# Patient Record
Sex: Female | Born: 1965 | Race: Black or African American | Hispanic: No | Smoking: Never smoker
Health system: Southern US, Community
[De-identification: ages and names within clinical notes are randomized; demographics above are authoritative.]

## PROBLEM LIST (undated history)

## (undated) DIAGNOSIS — I10 Essential (primary) hypertension: Secondary | ICD-10-CM

## (undated) DIAGNOSIS — T7840XA Allergy, unspecified, initial encounter: Secondary | ICD-10-CM

## (undated) DIAGNOSIS — H269 Unspecified cataract: Secondary | ICD-10-CM

## (undated) DIAGNOSIS — K219 Gastro-esophageal reflux disease without esophagitis: Secondary | ICD-10-CM

## (undated) DIAGNOSIS — C169 Malignant neoplasm of stomach, unspecified: Secondary | ICD-10-CM

## (undated) DIAGNOSIS — Z90710 Acquired absence of both cervix and uterus: Secondary | ICD-10-CM

## (undated) DIAGNOSIS — Z5189 Encounter for other specified aftercare: Secondary | ICD-10-CM

## (undated) DIAGNOSIS — Z800 Family history of malignant neoplasm of digestive organs: Secondary | ICD-10-CM

## (undated) DIAGNOSIS — Z803 Family history of malignant neoplasm of breast: Secondary | ICD-10-CM

## (undated) DIAGNOSIS — D219 Benign neoplasm of connective and other soft tissue, unspecified: Secondary | ICD-10-CM

## (undated) DIAGNOSIS — Z86718 Personal history of other venous thrombosis and embolism: Secondary | ICD-10-CM

## (undated) HISTORY — PX: UPPER GASTROINTESTINAL ENDOSCOPY: SHX188

## (undated) HISTORY — DX: Unspecified cataract: H26.9

## (undated) HISTORY — PX: EYE SURGERY: SHX253

## (undated) HISTORY — DX: Gastro-esophageal reflux disease without esophagitis: K21.9

## (undated) HISTORY — DX: Family history of malignant neoplasm of breast: Z80.3

## (undated) HISTORY — DX: Allergy, unspecified, initial encounter: T78.40XA

## (undated) HISTORY — DX: Encounter for other specified aftercare: Z51.89

## (undated) HISTORY — PX: ABDOMINAL HYSTERECTOMY: SHX81

## (undated) HISTORY — DX: Family history of malignant neoplasm of digestive organs: Z80.0

## (undated) HISTORY — DX: Malignant neoplasm of stomach, unspecified: C16.9

## (undated) HISTORY — DX: Benign neoplasm of connective and other soft tissue, unspecified: D21.9

## (undated) HISTORY — PX: COLONOSCOPY: SHX174

---

## 1997-06-23 ENCOUNTER — Ambulatory Visit (HOSPITAL_COMMUNITY): Admission: RE | Admit: 1997-06-23 | Discharge: 1997-06-23 | Payer: Self-pay | Admitting: Internal Medicine

## 1999-02-22 ENCOUNTER — Other Ambulatory Visit: Admission: RE | Admit: 1999-02-22 | Discharge: 1999-02-22 | Payer: Self-pay | Admitting: Obstetrics and Gynecology

## 1999-03-29 ENCOUNTER — Encounter: Admission: RE | Admit: 1999-03-29 | Discharge: 1999-03-29 | Payer: Self-pay | Admitting: Obstetrics and Gynecology

## 1999-03-29 ENCOUNTER — Encounter: Payer: Self-pay | Admitting: Obstetrics and Gynecology

## 1999-11-08 ENCOUNTER — Emergency Department (HOSPITAL_COMMUNITY): Admission: EM | Admit: 1999-11-08 | Discharge: 1999-11-08 | Payer: Self-pay | Admitting: Emergency Medicine

## 1999-11-08 ENCOUNTER — Encounter: Payer: Self-pay | Admitting: Emergency Medicine

## 2000-02-28 ENCOUNTER — Encounter (INDEPENDENT_AMBULATORY_CARE_PROVIDER_SITE_OTHER): Payer: Self-pay

## 2000-02-28 ENCOUNTER — Other Ambulatory Visit: Admission: RE | Admit: 2000-02-28 | Discharge: 2000-02-28 | Payer: Self-pay | Admitting: Obstetrics and Gynecology

## 2000-06-21 ENCOUNTER — Ambulatory Visit (HOSPITAL_COMMUNITY): Admission: RE | Admit: 2000-06-21 | Discharge: 2000-06-21 | Payer: Self-pay | Admitting: Gastroenterology

## 2001-03-12 ENCOUNTER — Other Ambulatory Visit: Admission: RE | Admit: 2001-03-12 | Discharge: 2001-03-12 | Payer: Self-pay | Admitting: Obstetrics and Gynecology

## 2002-11-06 ENCOUNTER — Ambulatory Visit (HOSPITAL_COMMUNITY): Admission: RE | Admit: 2002-11-06 | Discharge: 2002-11-06 | Payer: Self-pay | Admitting: Internal Medicine

## 2003-10-05 ENCOUNTER — Other Ambulatory Visit: Admission: RE | Admit: 2003-10-05 | Discharge: 2003-10-05 | Payer: Self-pay | Admitting: Obstetrics and Gynecology

## 2003-10-19 ENCOUNTER — Ambulatory Visit (HOSPITAL_COMMUNITY): Admission: RE | Admit: 2003-10-19 | Discharge: 2003-10-19 | Payer: Self-pay | Admitting: Obstetrics and Gynecology

## 2004-09-27 ENCOUNTER — Inpatient Hospital Stay (HOSPITAL_COMMUNITY): Admission: EM | Admit: 2004-09-27 | Discharge: 2004-10-07 | Payer: Self-pay | Admitting: Emergency Medicine

## 2004-09-28 ENCOUNTER — Encounter (INDEPENDENT_AMBULATORY_CARE_PROVIDER_SITE_OTHER): Payer: Self-pay | Admitting: Cardiology

## 2004-10-02 ENCOUNTER — Ambulatory Visit: Payer: Self-pay | Admitting: Oncology

## 2004-10-03 ENCOUNTER — Ambulatory Visit: Payer: Self-pay | Admitting: Oncology

## 2004-10-03 ENCOUNTER — Encounter: Payer: Self-pay | Admitting: Internal Medicine

## 2004-10-04 ENCOUNTER — Encounter (INDEPENDENT_AMBULATORY_CARE_PROVIDER_SITE_OTHER): Payer: Self-pay | Admitting: *Deleted

## 2004-10-19 ENCOUNTER — Ambulatory Visit (HOSPITAL_COMMUNITY): Admission: RE | Admit: 2004-10-19 | Discharge: 2004-10-19 | Payer: Self-pay | Admitting: Obstetrics and Gynecology

## 2004-11-24 ENCOUNTER — Ambulatory Visit: Payer: Self-pay | Admitting: Oncology

## 2004-12-25 ENCOUNTER — Ambulatory Visit (HOSPITAL_COMMUNITY): Admission: RE | Admit: 2004-12-25 | Discharge: 2004-12-25 | Payer: Self-pay | Admitting: Oncology

## 2004-12-28 ENCOUNTER — Ambulatory Visit (HOSPITAL_COMMUNITY): Admission: RE | Admit: 2004-12-28 | Discharge: 2004-12-29 | Payer: Self-pay | Admitting: General Surgery

## 2004-12-28 ENCOUNTER — Encounter (INDEPENDENT_AMBULATORY_CARE_PROVIDER_SITE_OTHER): Payer: Self-pay | Admitting: Specialist

## 2004-12-28 ENCOUNTER — Encounter (INDEPENDENT_AMBULATORY_CARE_PROVIDER_SITE_OTHER): Payer: Self-pay | Admitting: General Surgery

## 2005-01-15 ENCOUNTER — Ambulatory Visit: Payer: Self-pay | Admitting: Oncology

## 2005-05-11 ENCOUNTER — Ambulatory Visit: Payer: Self-pay | Admitting: Oncology

## 2005-05-21 ENCOUNTER — Ambulatory Visit (HOSPITAL_COMMUNITY): Admission: RE | Admit: 2005-05-21 | Discharge: 2005-05-21 | Payer: Self-pay | Admitting: Oncology

## 2005-11-08 ENCOUNTER — Ambulatory Visit: Payer: Self-pay | Admitting: Oncology

## 2005-11-14 LAB — CBC WITH DIFFERENTIAL/PLATELET
BASO%: 0.6 % (ref 0.0–2.0)
Basophils Absolute: 0 10*3/uL (ref 0.0–0.1)
EOS%: 2.1 % (ref 0.0–7.0)
Eosinophils Absolute: 0.1 10*3/uL (ref 0.0–0.5)
HCT: 34.7 % — ABNORMAL LOW (ref 34.8–46.6)
HGB: 11.7 g/dL (ref 11.6–15.9)
LYMPH%: 26.2 % (ref 14.0–48.0)
MCH: 27.9 pg (ref 26.0–34.0)
MCHC: 33.6 g/dL (ref 32.0–36.0)
MCV: 83.1 fL (ref 81.0–101.0)
MONO#: 0.4 10*3/uL (ref 0.1–0.9)
MONO%: 9.8 % (ref 0.0–13.0)
NEUT#: 2.6 10*3/uL (ref 1.5–6.5)
NEUT%: 61.3 % (ref 39.6–76.8)
Platelets: 345 10*3/uL (ref 145–400)
RBC: 4.18 10*6/uL (ref 3.70–5.32)
RDW: 14.9 % — ABNORMAL HIGH (ref 11.3–14.5)
WBC: 4.2 10*3/uL (ref 3.9–10.0)
lymph#: 1.1 10*3/uL (ref 0.9–3.3)

## 2005-11-15 LAB — COMPREHENSIVE METABOLIC PANEL
ALT: 18 U/L (ref 0–40)
AST: 21 U/L (ref 0–37)
Albumin: 4.3 g/dL (ref 3.5–5.2)
Alkaline Phosphatase: 72 U/L (ref 39–117)
BUN: 8 mg/dL (ref 6–23)
CO2: 25 mEq/L (ref 19–32)
Calcium: 9.2 mg/dL (ref 8.4–10.5)
Chloride: 107 mEq/L (ref 96–112)
Creatinine, Ser: 0.78 mg/dL (ref 0.40–1.20)
Glucose, Bld: 124 mg/dL — ABNORMAL HIGH (ref 70–99)
Potassium: 4 mEq/L (ref 3.5–5.3)
Sodium: 138 mEq/L (ref 135–145)
Total Bilirubin: 0.4 mg/dL (ref 0.3–1.2)
Total Protein: 6.8 g/dL (ref 6.0–8.3)

## 2005-11-15 LAB — BETA 2 MICROGLOBULIN, SERUM: Beta-2 Microglobulin: 1.21 mg/L (ref 1.01–1.73)

## 2005-11-16 ENCOUNTER — Ambulatory Visit (HOSPITAL_COMMUNITY): Admission: RE | Admit: 2005-11-16 | Discharge: 2005-11-16 | Payer: Self-pay | Admitting: Oncology

## 2005-11-30 ENCOUNTER — Ambulatory Visit (HOSPITAL_COMMUNITY): Admission: RE | Admit: 2005-11-30 | Discharge: 2005-11-30 | Payer: Self-pay | Admitting: Obstetrics and Gynecology

## 2006-05-24 ENCOUNTER — Emergency Department (HOSPITAL_COMMUNITY): Admission: EM | Admit: 2006-05-24 | Discharge: 2006-05-24 | Payer: Self-pay | Admitting: Family Medicine

## 2006-08-01 ENCOUNTER — Ambulatory Visit (HOSPITAL_BASED_OUTPATIENT_CLINIC_OR_DEPARTMENT_OTHER): Admission: RE | Admit: 2006-08-01 | Discharge: 2006-08-01 | Payer: Self-pay | Admitting: General Surgery

## 2006-08-01 ENCOUNTER — Encounter (INDEPENDENT_AMBULATORY_CARE_PROVIDER_SITE_OTHER): Payer: Self-pay | Admitting: *Deleted

## 2006-09-10 ENCOUNTER — Ambulatory Visit (HOSPITAL_COMMUNITY): Admission: RE | Admit: 2006-09-10 | Discharge: 2006-09-10 | Payer: Self-pay | Admitting: Internal Medicine

## 2006-12-03 ENCOUNTER — Ambulatory Visit: Payer: Self-pay | Admitting: Oncology

## 2006-12-04 ENCOUNTER — Ambulatory Visit (HOSPITAL_COMMUNITY): Admission: RE | Admit: 2006-12-04 | Discharge: 2006-12-04 | Payer: Self-pay | Admitting: Oncology

## 2006-12-04 LAB — CBC WITH DIFFERENTIAL/PLATELET
BASO%: 0.6 % (ref 0.0–2.0)
Basophils Absolute: 0 10*3/uL (ref 0.0–0.1)
EOS%: 1 % (ref 0.0–7.0)
Eosinophils Absolute: 0 10*3/uL (ref 0.0–0.5)
HCT: 32.7 % — ABNORMAL LOW (ref 34.8–46.6)
HGB: 11.3 g/dL — ABNORMAL LOW (ref 11.6–15.9)
LYMPH%: 28.6 % (ref 14.0–48.0)
MCH: 28.1 pg (ref 26.0–34.0)
MCHC: 34.4 g/dL (ref 32.0–36.0)
MCV: 81.7 fL (ref 81.0–101.0)
MONO#: 0.3 10*3/uL (ref 0.1–0.9)
MONO%: 8.2 % (ref 0.0–13.0)
NEUT#: 2.1 10*3/uL (ref 1.5–6.5)
NEUT%: 61.6 % (ref 39.6–76.8)
Platelets: 286 10*3/uL (ref 145–400)
RBC: 4 10*6/uL (ref 3.70–5.32)
RDW: 13.6 % (ref 11.3–14.5)
WBC: 3.4 10*3/uL — ABNORMAL LOW (ref 3.9–10.0)
lymph#: 1 10*3/uL (ref 0.9–3.3)

## 2006-12-06 LAB — LACTATE DEHYDROGENASE: LDH: 159 U/L (ref 94–250)

## 2006-12-06 LAB — COMPREHENSIVE METABOLIC PANEL
ALT: 11 U/L (ref 0–35)
AST: 16 U/L (ref 0–37)
Albumin: 3.9 g/dL (ref 3.5–5.2)
Alkaline Phosphatase: 51 U/L (ref 39–117)
BUN: 10 mg/dL (ref 6–23)
CO2: 22 mEq/L (ref 19–32)
Calcium: 8.9 mg/dL (ref 8.4–10.5)
Chloride: 106 mEq/L (ref 96–112)
Creatinine, Ser: 0.79 mg/dL (ref 0.40–1.20)
Glucose, Bld: 93 mg/dL (ref 70–99)
Potassium: 3.8 mEq/L (ref 3.5–5.3)
Sodium: 137 mEq/L (ref 135–145)
Total Bilirubin: 0.7 mg/dL (ref 0.3–1.2)
Total Protein: 6.5 g/dL (ref 6.0–8.3)

## 2006-12-06 LAB — BETA 2 MICROGLOBULIN, SERUM: Beta-2 Microglobulin: 1.09 mg/L (ref 1.01–1.73)

## 2006-12-06 LAB — ANGIOTENSIN CONVERTING ENZYME: Angiotensin 1 Converting Enzyme: 36 U/L (ref 9–67)

## 2007-05-14 ENCOUNTER — Emergency Department (HOSPITAL_COMMUNITY): Admission: EM | Admit: 2007-05-14 | Discharge: 2007-05-14 | Payer: Self-pay | Admitting: Emergency Medicine

## 2008-05-04 ENCOUNTER — Encounter: Admission: RE | Admit: 2008-05-04 | Discharge: 2008-05-04 | Payer: Self-pay | Admitting: Chiropractic Medicine

## 2010-02-03 ENCOUNTER — Ambulatory Visit (HOSPITAL_COMMUNITY): Admission: RE | Admit: 2010-02-03 | Discharge: 2010-02-03 | Payer: Self-pay | Admitting: Internal Medicine

## 2010-04-30 DIAGNOSIS — K635 Polyp of colon: Secondary | ICD-10-CM

## 2010-04-30 HISTORY — DX: Polyp of colon: K63.5

## 2010-06-29 ENCOUNTER — Other Ambulatory Visit (HOSPITAL_COMMUNITY): Payer: Self-pay | Admitting: Obstetrics and Gynecology

## 2010-06-29 DIAGNOSIS — Z1231 Encounter for screening mammogram for malignant neoplasm of breast: Secondary | ICD-10-CM

## 2010-07-03 ENCOUNTER — Emergency Department (HOSPITAL_COMMUNITY)
Admission: EM | Admit: 2010-07-03 | Discharge: 2010-07-03 | Disposition: A | Discharge status: Home / Self Care | Payer: PRIVATE HEALTH INSURANCE | Attending: Emergency Medicine | Admitting: Emergency Medicine

## 2010-07-03 DIAGNOSIS — T481X5A Adverse effect of skeletal muscle relaxants [neuromuscular blocking agents], initial encounter: Secondary | ICD-10-CM | POA: Insufficient documentation

## 2010-07-03 DIAGNOSIS — R059 Cough, unspecified: Secondary | ICD-10-CM | POA: Insufficient documentation

## 2010-07-03 DIAGNOSIS — Z79899 Other long term (current) drug therapy: Secondary | ICD-10-CM | POA: Insufficient documentation

## 2010-07-03 DIAGNOSIS — I10 Essential (primary) hypertension: Secondary | ICD-10-CM | POA: Insufficient documentation

## 2010-07-03 DIAGNOSIS — R05 Cough: Secondary | ICD-10-CM | POA: Insufficient documentation

## 2010-07-03 DIAGNOSIS — H02849 Edema of unspecified eye, unspecified eyelid: Secondary | ICD-10-CM | POA: Insufficient documentation

## 2010-07-03 DIAGNOSIS — Y929 Unspecified place or not applicable: Secondary | ICD-10-CM | POA: Insufficient documentation

## 2010-07-03 DIAGNOSIS — R0602 Shortness of breath: Secondary | ICD-10-CM | POA: Insufficient documentation

## 2010-07-03 DIAGNOSIS — R454 Irritability and anger: Secondary | ICD-10-CM | POA: Insufficient documentation

## 2010-07-04 ENCOUNTER — Ambulatory Visit (HOSPITAL_COMMUNITY)
Admission: RE | Admit: 2010-07-04 | Discharge: 2010-07-04 | Disposition: A | Discharge status: Home / Self Care | Payer: PRIVATE HEALTH INSURANCE | Source: Ambulatory Visit | Attending: Obstetrics and Gynecology | Admitting: Obstetrics and Gynecology

## 2010-07-04 DIAGNOSIS — Z1231 Encounter for screening mammogram for malignant neoplasm of breast: Secondary | ICD-10-CM | POA: Insufficient documentation

## 2010-07-11 ENCOUNTER — Ambulatory Visit (HOSPITAL_BASED_OUTPATIENT_CLINIC_OR_DEPARTMENT_OTHER)
Admission: RE | Admit: 2010-07-11 | Discharge: 2010-07-11 | Disposition: A | Discharge status: Home / Self Care | Payer: PRIVATE HEALTH INSURANCE | Source: Ambulatory Visit | Attending: Obstetrics and Gynecology | Admitting: Obstetrics and Gynecology

## 2010-07-11 ENCOUNTER — Other Ambulatory Visit: Payer: Self-pay | Admitting: Obstetrics and Gynecology

## 2010-07-11 DIAGNOSIS — N949 Unspecified condition associated with female genital organs and menstrual cycle: Secondary | ICD-10-CM | POA: Insufficient documentation

## 2010-07-11 DIAGNOSIS — N925 Other specified irregular menstruation: Secondary | ICD-10-CM | POA: Insufficient documentation

## 2010-07-11 DIAGNOSIS — N938 Other specified abnormal uterine and vaginal bleeding: Secondary | ICD-10-CM | POA: Insufficient documentation

## 2010-07-11 DIAGNOSIS — Z79899 Other long term (current) drug therapy: Secondary | ICD-10-CM | POA: Insufficient documentation

## 2010-07-11 DIAGNOSIS — I10 Essential (primary) hypertension: Secondary | ICD-10-CM | POA: Insufficient documentation

## 2010-07-11 DIAGNOSIS — N840 Polyp of corpus uteri: Secondary | ICD-10-CM | POA: Insufficient documentation

## 2010-07-11 LAB — POCT I-STAT 4, (NA,K, GLUC, HGB,HCT)
Glucose, Bld: 87 mg/dL (ref 70–99)
HCT: 32 % — ABNORMAL LOW (ref 36.0–46.0)
Hemoglobin: 10.9 g/dL — ABNORMAL LOW (ref 12.0–15.0)
Potassium: 4.4 mEq/L (ref 3.5–5.1)
Sodium: 142 mEq/L (ref 135–145)

## 2010-07-13 NOTE — Op Note (Signed)
  NAME:  Sheryl [name], Sheryl [name] NO.:  [id number]  MEDICAL RECORD NO.:  [id number]          PATIENT TYPE:  LOCATION:                                 FACILITY:  PHYSICIAN:  Maxie Better, M.D.DATE OF BIRTH:  October 25, 1965  DATE OF PROCEDURE:  07/11/2010 DATE OF DISCHARGE:                              OPERATIVE REPORT   PREOPERATIVE DIAGNOSES:  Dysfunctional uterine bleeding and endometrial polyps.  POSTOPERATIVE DIAGNOSES:  Dysfunctional uterine bleeding, endometrial polyp.  PROCEDURE:  Diagnostic hysteroscopy, hysteroscopic resection of endometrial polyp, D and C.  ANESTHESIA:  General, paracervical block.  SURGEON:  Maxie Better, MD  ASSISTANT:  None.  PROCEDURE:  Under adequate general anesthesia, the patient was placed in the dorsal lithotomy position.  She was sterilely prepped and draped in usual fashion.  The bladder was catheterized for moderate amount of urine.  Examination under anesthesia revealed a retroverted uterus.  No adnexal masses could be appreciated.  The bivalve speculum was placed in vagina.  200 mL of 1% Nesacaine was injected paracervical at 3 and 9 o'clock position.  The cervix was parous.  The anterior lip of the cervix was grasped with a single-tooth tenaculum.  The cervix was serially dilated to #31 [facility] dilator.  A glycine primed resectoscope was introduced into the uterine cavity without difficulty, some blood was noted.  Both tubal ostia could not be seen as they appeared to be sclerosed.  The endometrium appeared irregular and thickened. The circumferential inspection did not reveal the polyps that had been noted on sonohysterogram; therefore, the resectoscope was removed.  The cavity was curetted for small amount of tissue.  The resectoscope was then reinserted.  At that point, a posterior polyp was noted which was then resected without difficulty.  The reinspection of this cavity showed good sampling of it with the D  and C.  The cavity was felt to be clean and endocervical canal was without any lesions and the resectoscope was then removed, the cavity once again curetted.  All instruments were then removed from the vagina.  Specimen labeled endometrial curetting with polyp and was sent to pathology.  Estimated blood loss was minimal. Fluid deficit was 75 mL.  Complication was none.  The patient tolerated the procedure well and was transferred to recovery in stable condition.     Maxie Better, M.D.    Hope/MEDQ  D:  07/11/2010  T:  07/11/2010  Job:  [id number]  Electronically Signed by Nena Jordan Xylah Early M.D. on 07/13/2010 05:42:49 AM

## 2010-08-18 ENCOUNTER — Encounter (HOSPITAL_COMMUNITY)
Admission: RE | Admit: 2010-08-18 | Discharge: 2010-08-18 | Disposition: A | Discharge status: Home / Self Care | Payer: PRIVATE HEALTH INSURANCE | Source: Ambulatory Visit | Attending: Obstetrics and Gynecology | Admitting: Obstetrics and Gynecology

## 2010-08-18 LAB — BASIC METABOLIC PANEL
BUN: 8 mg/dL (ref 6–23)
CO2: 24 mEq/L (ref 19–32)
Calcium: 9.3 mg/dL (ref 8.4–10.5)
Chloride: 107 mEq/L (ref 96–112)
Creatinine, Ser: 0.86 mg/dL (ref 0.4–1.2)
GFR calc Af Amer: >60 mL/min (ref >60)
GFR calc non Af Amer: >60 mL/min (ref >60)
Glucose, Bld: 86 mg/dL (ref 70–99)
Potassium: 3.7 mEq/L (ref 3.5–5.1)
Sodium: 136 mEq/L (ref 135–145)

## 2010-08-18 LAB — SURGICAL PCR SCREEN
MRSA, PCR: NEGATIVE
Staphylococcus aureus: NEGATIVE

## 2010-08-18 LAB — CBC
HCT: 36.7 % (ref 36.0–46.0)
Hemoglobin: 12.3 g/dL (ref 12.0–15.0)
MCH: 27.8 pg (ref 26.0–34.0)
MCHC: 33.5 g/dL (ref 30.0–36.0)
MCV: 83 fL (ref 78.0–100.0)
Platelets: 373 10*3/uL (ref 150–400)
RBC: 4.42 MIL/uL (ref 3.87–5.11)
RDW: 13.5 % (ref 11.5–15.5)
WBC: 4.6 10*3/uL (ref 4.0–10.5)

## 2010-08-25 ENCOUNTER — Ambulatory Visit (HOSPITAL_COMMUNITY)
Admission: RE | Admit: 2010-08-25 | Discharge: 2010-08-27 | Disposition: A | Discharge status: Home / Self Care | Payer: PRIVATE HEALTH INSURANCE | Source: Ambulatory Visit | Attending: Obstetrics and Gynecology | Admitting: Obstetrics and Gynecology

## 2010-08-25 ENCOUNTER — Other Ambulatory Visit: Payer: Self-pay | Admitting: Obstetrics and Gynecology

## 2010-08-25 DIAGNOSIS — N938 Other specified abnormal uterine and vaginal bleeding: Secondary | ICD-10-CM | POA: Insufficient documentation

## 2010-08-25 DIAGNOSIS — N949 Unspecified condition associated with female genital organs and menstrual cycle: Secondary | ICD-10-CM | POA: Insufficient documentation

## 2010-08-25 DIAGNOSIS — N7013 Chronic salpingitis and oophoritis: Secondary | ICD-10-CM | POA: Insufficient documentation

## 2010-08-25 DIAGNOSIS — G5610 Other lesions of median nerve, unspecified upper limb: Secondary | ICD-10-CM | POA: Insufficient documentation

## 2010-08-25 DIAGNOSIS — N925 Other specified irregular menstruation: Secondary | ICD-10-CM | POA: Insufficient documentation

## 2010-08-25 DIAGNOSIS — D259 Leiomyoma of uterus, unspecified: Secondary | ICD-10-CM | POA: Insufficient documentation

## 2010-08-25 LAB — BASIC METABOLIC PANEL
BUN: 8 mg/dL (ref 6–23)
CO2: 23 mEq/L (ref 19–32)
Calcium: 8.4 mg/dL (ref 8.4–10.5)
Chloride: 104 mEq/L (ref 96–112)
Creatinine, Ser: 0.93 mg/dL (ref 0.4–1.2)
GFR calc Af Amer: >60 mL/min (ref >60)
GFR calc non Af Amer: >60 mL/min (ref >60)
Glucose, Bld: 169 mg/dL — ABNORMAL HIGH (ref 70–99)
Potassium: 3.8 mEq/L (ref 3.5–5.1)
Sodium: 134 mEq/L — ABNORMAL LOW (ref 135–145)

## 2010-08-25 LAB — CBC
HCT: 33.8 % — ABNORMAL LOW (ref 36.0–46.0)
Hemoglobin: 11.2 g/dL — ABNORMAL LOW (ref 12.0–15.0)
MCH: 27.5 pg (ref 26.0–34.0)
MCHC: 33.1 g/dL (ref 30.0–36.0)
MCV: 82.8 fL (ref 78.0–100.0)
Platelets: 295 10*3/uL (ref 150–400)
RBC: 4.08 MIL/uL (ref 3.87–5.11)
RDW: 13.5 % (ref 11.5–15.5)
WBC: 10 10*3/uL (ref 4.0–10.5)

## 2010-08-26 LAB — CBC
HCT: 32.1 % — ABNORMAL LOW (ref 36.0–46.0)
Hemoglobin: 10.5 g/dL — ABNORMAL LOW (ref 12.0–15.0)
MCH: 27.1 pg (ref 26.0–34.0)
MCHC: 32.7 g/dL (ref 30.0–36.0)
MCV: 82.7 fL (ref 78.0–100.0)
Platelets: 276 10*3/uL (ref 150–400)
RBC: 3.88 MIL/uL (ref 3.87–5.11)
RDW: 13.6 % (ref 11.5–15.5)
WBC: 8 10*3/uL (ref 4.0–10.5)

## 2010-08-26 LAB — BASIC METABOLIC PANEL
BUN: 6 mg/dL (ref 6–23)
CO2: 25 mEq/L (ref 19–32)
Calcium: 8.5 mg/dL (ref 8.4–10.5)
Chloride: 107 mEq/L (ref 96–112)
Creatinine, Ser: 0.7 mg/dL (ref 0.4–1.2)
GFR calc Af Amer: >60 mL/min (ref >60)
GFR calc non Af Amer: >60 mL/min (ref >60)
Glucose, Bld: 134 mg/dL — ABNORMAL HIGH (ref 70–99)
Potassium: 3.8 mEq/L (ref 3.5–5.1)
Sodium: 137 mEq/L (ref 135–145)

## 2010-09-01 ENCOUNTER — Inpatient Hospital Stay (HOSPITAL_COMMUNITY)
Admission: AD | Admit: 2010-09-01 | Discharge: 2010-09-01 | Disposition: A | Discharge status: Home / Self Care | Payer: PRIVATE HEALTH INSURANCE | Source: Ambulatory Visit | Attending: Obstetrics and Gynecology | Admitting: Obstetrics and Gynecology

## 2010-09-01 DIAGNOSIS — M436 Torticollis: Secondary | ICD-10-CM | POA: Insufficient documentation

## 2010-09-01 DIAGNOSIS — M7989 Other specified soft tissue disorders: Secondary | ICD-10-CM | POA: Insufficient documentation

## 2010-09-01 DIAGNOSIS — M542 Cervicalgia: Secondary | ICD-10-CM | POA: Insufficient documentation

## 2010-09-01 LAB — URINALYSIS, ROUTINE W REFLEX MICROSCOPIC
Bilirubin Urine: NEGATIVE
Glucose, UA: NEGATIVE mg/dL
Hgb urine dipstick: NEGATIVE
Ketones, ur: NEGATIVE mg/dL
Nitrite: NEGATIVE
Protein, ur: NEGATIVE mg/dL
Specific Gravity, Urine: 1.01 (ref 1.005–1.030)
Urobilinogen, UA: 0.2 mg/dL (ref 0.0–1.0)
pH: 7 (ref 5.0–8.0)

## 2010-09-02 ENCOUNTER — Inpatient Hospital Stay (HOSPITAL_COMMUNITY)
Admission: AD | Admit: 2010-09-02 | Discharge: 2010-09-02 | Disposition: A | Discharge status: Home / Self Care | Payer: PRIVATE HEALTH INSURANCE | Source: Ambulatory Visit | Attending: Obstetrics and Gynecology | Admitting: Obstetrics and Gynecology

## 2010-09-02 ENCOUNTER — Ambulatory Visit (HOSPITAL_COMMUNITY)
Admission: EM | Admit: 2010-09-02 | Discharge: 2010-09-02 | Disposition: A | Discharge status: Home / Self Care | Payer: PRIVATE HEALTH INSURANCE | Source: Ambulatory Visit | Attending: Obstetrics and Gynecology | Admitting: Obstetrics and Gynecology

## 2010-09-02 DIAGNOSIS — M79609 Pain in unspecified limb: Secondary | ICD-10-CM

## 2010-09-02 DIAGNOSIS — M542 Cervicalgia: Secondary | ICD-10-CM | POA: Insufficient documentation

## 2010-09-02 DIAGNOSIS — M7989 Other specified soft tissue disorders: Secondary | ICD-10-CM | POA: Insufficient documentation

## 2010-09-02 DIAGNOSIS — M436 Torticollis: Secondary | ICD-10-CM | POA: Insufficient documentation

## 2010-09-02 DIAGNOSIS — I82619 Acute embolism and thrombosis of superficial veins of unspecified upper extremity: Secondary | ICD-10-CM | POA: Insufficient documentation

## 2010-09-03 LAB — URINE CULTURE
Colony Count: NO GROWTH
Culture  Setup Time: 201205050423
Culture: NO GROWTH

## 2010-09-08 ENCOUNTER — Other Ambulatory Visit: Payer: Self-pay | Admitting: Hematology and Oncology

## 2010-09-08 ENCOUNTER — Encounter (HOSPITAL_BASED_OUTPATIENT_CLINIC_OR_DEPARTMENT_OTHER): Payer: PRIVATE HEALTH INSURANCE | Admitting: Hematology and Oncology

## 2010-09-08 DIAGNOSIS — R1902 Left upper quadrant abdominal swelling, mass and lump: Secondary | ICD-10-CM

## 2010-09-08 DIAGNOSIS — R1900 Intra-abdominal and pelvic swelling, mass and lump, unspecified site: Secondary | ICD-10-CM

## 2010-09-08 DIAGNOSIS — I82509 Chronic embolism and thrombosis of unspecified deep veins of unspecified lower extremity: Secondary | ICD-10-CM

## 2010-09-08 LAB — COMPREHENSIVE METABOLIC PANEL
ALT: 34 U/L (ref 0–35)
AST: 27 U/L (ref 0–37)
Albumin: 3.9 g/dL (ref 3.5–5.2)
Alkaline Phosphatase: 101 U/L (ref 39–117)
BUN: 10 mg/dL (ref 6–23)
CO2: 25 mEq/L (ref 19–32)
Calcium: 9.2 mg/dL (ref 8.4–10.5)
Chloride: 102 mEq/L (ref 96–112)
Creatinine, Ser: 0.66 mg/dL (ref 0.40–1.20)
Glucose, Bld: 86 mg/dL (ref 70–99)
Potassium: 4 mEq/L (ref 3.5–5.3)
Sodium: 135 mEq/L (ref 135–145)
Total Bilirubin: 0.2 mg/dL — ABNORMAL LOW (ref 0.3–1.2)
Total Protein: 7.3 g/dL (ref 6.0–8.3)

## 2010-09-08 LAB — CBC WITH DIFFERENTIAL/PLATELET
BASO%: 0.4 % (ref 0.0–2.0)
Basophils Absolute: 0 10*3/uL (ref 0.0–0.1)
EOS%: 1.2 % (ref 0.0–7.0)
Eosinophils Absolute: 0.1 10*3/uL (ref 0.0–0.5)
HCT: 33.8 % — ABNORMAL LOW (ref 34.8–46.6)
HGB: 11.3 g/dL — ABNORMAL LOW (ref 11.6–15.9)
LYMPH%: 19.2 % (ref 14.0–49.7)
MCH: 27.8 pg (ref 25.1–34.0)
MCHC: 33.3 g/dL (ref 31.5–36.0)
MCV: 83.4 fL (ref 79.5–101.0)
MONO#: 0.4 10*3/uL (ref 0.1–0.9)
MONO%: 7.3 % (ref 0.0–14.0)
NEUT#: 3.9 10*3/uL (ref 1.5–6.5)
NEUT%: 71.9 % (ref 38.4–76.8)
Platelets: 430 10*3/uL — ABNORMAL HIGH (ref 145–400)
RBC: 4.06 10*6/uL (ref 3.70–5.45)
RDW: 14.4 % (ref 11.2–14.5)
WBC: 5.4 10*3/uL (ref 3.9–10.3)
lymph#: 1 10*3/uL (ref 0.9–3.3)

## 2010-09-08 LAB — IRON AND TIBC
%SAT: 11 % — ABNORMAL LOW (ref 20–55)
Iron: 33 ug/dL — ABNORMAL LOW (ref 42–145)
TIBC: 287 ug/dL (ref 250–470)
UIBC: 254 ug/dL

## 2010-09-08 LAB — PROTIME-INR
INR: 1.1 — ABNORMAL LOW (ref 2.00–3.50)
Protime: 13.2 Seconds (ref 10.6–13.4)

## 2010-09-08 LAB — LACTATE DEHYDROGENASE: LDH: 158 U/L (ref 94–250)

## 2010-09-08 LAB — FERRITIN: Ferritin: 43 ng/mL (ref 10–291)

## 2010-09-11 NOTE — Discharge Summary (Signed)
NAME:  Sheryl [name], COPE NO.:  [id number]  MEDICAL RECORD NO.:  [id number]           PATIENT TYPE:  O  LOCATION:  9316                          FACILITY:  [facility]  PHYSICIAN:  Maxie Better, M.D.DATE OF BIRTH:  [birth year]  DATE OF ADMISSION:  08/25/2010 DATE OF DISCHARGE:  08/27/2010                              DISCHARGE SUMMARY   ADMISSION DIAGNOSES:  Dysfunctional uterine bleeding, fibroid uterus, possible right hydrosalpinx.  DISCHARGE DIAGNOSES:  Dysfunctional uterine bleeding, fibroid uterus, mesenteric injury.  PROCEDURES:  Da Vinci robotic total laparoscopic hysterectomy and intraoperative diagnostic laparoscopy.  HISTORY OF PRESENT ILLNESS:  This is a 45 year old female admitted for a da Vinci  robotic TLH secondary to dysfunctional uterine bleeding and fibroid uterus.  There was a possibility of a right hydrosalpinx noted on ultrasound.  The patient underwent a diagnostic hysteroscopy and removal of endometrial polyps but continued to have dysfunctional bleeding as well as pelvic pain with her cycle and felt that she wanted to do definitive surgical management.  Risks of procedure was reviewed with the patient.  Consent was signed and the patient went to the operating room.  HOSPITAL COURSE:  The patient was admitted to [facility] and she was taken to the operating room where she underwent da Vinci robotic total laparoscopic hysterectomy.  That procedure was performed after evaluation for what appeared to have been a trocar cannula injury in preparation for the da Vinci robotic surgery.  The patient had mesenteric injury for which General Surgery was called intraoperatively. Dr. Lindie Spruce was kind enough to respond and assess the injury and the procedure was then continued thereafter.  Please see the dictated operative note from his notes about his portion of the case. Postoperatively, the patient had a slow bowel function.  She had some bowel  sounds but was not passing gas.  She was remained in the hospital with aggressive bowel regimen management, ultimately had spontaneous passage of flatus at the wee hours of the night.  Intraoperative findings were that of a fibroid uterus measuring 302 grams.  Surgical management of tubal ligation in the past without any evidence of hydrosalpinx and therefore the tubes are not removed.  Ovaries were normal although there were adhesions surrounding them.  Normal upper abdomen.  The final pathology is pending on the surgery.  The blood loss was related to the mesenteric injury.  CBC was obtained in the PACU which showed hemoglobin of 11.2, hematocrit of 33.8, white count of 10, platelet count 295,000.  Her postop day #1 CBC showed a hemoglobin 10.5, hematocrit 32.1, white count of 8, platelet count of 276,000.  She had elevation of glucose on BMET, but otherwise those labs were all stable. On the day postop of surgery, the patient complained of numbness of the first three fingers extending up to her antecubital area with some pain surrounding that area.  Evaluation revealed that that arm on the left was little bit swollen relative to the right side and the distribution of her complaint related to median nerve compression.  The patient was evaluated by anesthesia who had recommended occupational therapy and physical therapy which was  not available on the weekend.  Nonetheless, by postop day #2, her symptoms essentially resolved.  Her abdomen had great bowel sounds.  Incision was well approximated.  The rest of exam was unremarkable.  The patient was deemed well to be discharged home. Disposition is home.  Condition stable.  DISCHARGE MEDICATIONS:  The patient already had Tylox at home which should be 1-2 tablets every 3-4 hours p.r.n. pain and please see the rest of her medications on the medical reconciliation sheet.  DISCHARGE INSTRUCTIONS:  To call for temperature greater than or  equal to 100.4.  Nothing per vagina for 4-6 weeks.  No driving for 2 weeks. No lifting greater 25 pounds for 2 weeks.  Call for severe abdominal pain, nausea, vomiting, no straining with bowel movements, increased incisional pain, drainage or redness of incision site.  Followup in 2 weeks at [facility] for vaginal cuff evaluation and incision, otherwise 6 weeks postop.     Maxie Better, M.D.     Polo/MEDQ  D:  08/27/2010  T:  08/27/2010  Job:  [id number]  Electronically Signed by Nena Jordan Taylie Helder M.D. on 09/11/2010 07:25:34 AM

## 2010-09-13 ENCOUNTER — Ambulatory Visit (HOSPITAL_COMMUNITY)
Admission: AD | Admit: 2010-09-13 | Discharge: 2010-09-13 | Disposition: A | Discharge status: Home / Self Care | Payer: PRIVATE HEALTH INSURANCE | Source: Ambulatory Visit | Attending: Obstetrics and Gynecology | Admitting: Obstetrics and Gynecology

## 2010-09-13 DIAGNOSIS — T81329A Deep disruption or dehiscence of operation wound, unspecified, initial encounter: Secondary | ICD-10-CM | POA: Insufficient documentation

## 2010-09-13 DIAGNOSIS — Y838 Other surgical procedures as the cause of abnormal reaction of the patient, or of later complication, without mention of misadventure at the time of the procedure: Secondary | ICD-10-CM | POA: Insufficient documentation

## 2010-09-13 DIAGNOSIS — IMO0002 Reserved for concepts with insufficient information to code with codable children: Secondary | ICD-10-CM | POA: Insufficient documentation

## 2010-09-13 DIAGNOSIS — T8132XA Disruption of internal operation (surgical) wound, not elsewhere classified, initial encounter: Secondary | ICD-10-CM | POA: Insufficient documentation

## 2010-09-13 LAB — CBC
HCT: 33.7 % — ABNORMAL LOW (ref 36.0–46.0)
HCT: 29.9 % — ABNORMAL LOW (ref 36.0–46.0)
Hemoglobin: 11.2 g/dL — ABNORMAL LOW (ref 12.0–15.0)
Hemoglobin: 9.7 g/dL — ABNORMAL LOW (ref 12.0–15.0)
MCH: 26.9 pg (ref 26.0–34.0)
MCH: 26.3 pg (ref 26.0–34.0)
MCHC: 33.2 g/dL (ref 30.0–36.0)
MCHC: 32.4 g/dL (ref 30.0–36.0)
MCV: 80.8 fL (ref 78.0–100.0)
MCV: 81 fL (ref 78.0–100.0)
Platelets: 515 10*3/uL — ABNORMAL HIGH (ref 150–400)
Platelets: 459 10*3/uL — ABNORMAL HIGH (ref 150–400)
RBC: 4.17 MIL/uL (ref 3.87–5.11)
RBC: 3.69 MIL/uL — ABNORMAL LOW (ref 3.87–5.11)
RDW: 13.8 % (ref 11.5–15.5)
RDW: 13.7 % (ref 11.5–15.5)
WBC: 6.3 10*3/uL (ref 4.0–10.5)
WBC: 6.4 10*3/uL (ref 4.0–10.5)

## 2010-09-13 LAB — SAMPLE TO BLOOD BANK

## 2010-09-14 ENCOUNTER — Ambulatory Visit (HOSPITAL_COMMUNITY)
Admission: RE | Admit: 2010-09-14 | Discharge: 2010-09-14 | Disposition: A | Discharge status: Home / Self Care | Payer: PRIVATE HEALTH INSURANCE | Source: Ambulatory Visit | Attending: Hematology and Oncology | Admitting: Hematology and Oncology

## 2010-09-14 DIAGNOSIS — Z87898 Personal history of other specified conditions: Secondary | ICD-10-CM | POA: Insufficient documentation

## 2010-09-14 DIAGNOSIS — R1902 Left upper quadrant abdominal swelling, mass and lump: Secondary | ICD-10-CM | POA: Insufficient documentation

## 2010-09-14 DIAGNOSIS — Z90710 Acquired absence of both cervix and uterus: Secondary | ICD-10-CM | POA: Insufficient documentation

## 2010-09-14 DIAGNOSIS — N289 Disorder of kidney and ureter, unspecified: Secondary | ICD-10-CM | POA: Insufficient documentation

## 2010-09-14 DIAGNOSIS — R1900 Intra-abdominal and pelvic swelling, mass and lump, unspecified site: Secondary | ICD-10-CM

## 2010-09-14 DIAGNOSIS — R609 Edema, unspecified: Secondary | ICD-10-CM | POA: Insufficient documentation

## 2010-09-14 DIAGNOSIS — N898 Other specified noninflammatory disorders of vagina: Secondary | ICD-10-CM | POA: Insufficient documentation

## 2010-09-14 MED ORDER — IOHEXOL 300 MG/ML  SOLN
125.0000 mL | Freq: Once | INTRAMUSCULAR | Status: AC | PRN
  Administered 2010-09-14: 125 mL via INTRAVENOUS

## 2010-09-15 NOTE — Discharge Summary (Signed)
NAME:  Sheryl [name], Sheryl [name] NO.:  [id number]   MEDICAL RECORD NO.:  [id number]          PATIENT TYPE:  INP   LOCATION:  3728                         FACILITY:  [facility]   PHYSICIAN:  Margaretmary Bayley, M.D.    DATE OF BIRTH:  [birth year]   DATE OF ADMISSION:  09/27/2004  DATE OF DISCHARGE:  10/07/2004                                 DISCHARGE SUMMARY   DISCHARGE DIAGNOSES:  1.  Left upper quadrant abdominal mass with enlarged regional lymph nodes.      Rule out lymphoma.  2.  Complex cysts, kidney.  3.  Gastroesophageal reflux disease.   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  She  denies any associated shortness of breath, no diaphoresis, nausea or  vomiting.  The patient was seen in the emergency room where she was  evaluated, and as part of her evaluation, she had a slightly increased CK  level, but otherwise, negative workup, but is admitted because of the  failure of any regression of her pain with remedies in the emergency room.  She denies any previous history of cigarette smoking, previous heart disease  or lung disease.  No fever, chills or night sweats.   PHYSICAL EXAMINATION:  GENERAL APPEARANCE:  She is a well-developed,  slightly obese, African American female. Anxious, but without any acute  distress.  VITAL SIGNS:  Blood pressure 142/84, pulse 88, respirations 22, temperature  98.8.  HEENT:  Nonicteric.  SKIN:  Warm and dry.  NECK:  Supple.  No lymphadenopathy.  She has mild generalized enlargement of  her thyroid.  No tenderness.  Axilla was fatty adenopathy.  BREASTS:  __________ tenderness.  CHEST:  There is no focal chest wall tenderness.  Lungs were clear.  CARDIAC:  Regular rate and rhythm.  No murmurs,  rubs, gallops, heaves or  thrills.  ABDOMEN:  Obese, soft, nontender.  No organomegaly or masses.   PERTINENT LABORATORY DATA:  Admission CK is elevated at 260, but her MB band  is normally 2.0.  Her relative index is 0.8.  Anterior troponin is 0.01.  EKG revealed normal sinus rhythm.  She had no acute or chronic changes, and  it was felt to be a normal tracing.  Chest x-ray showed no acute infiltrate,  but she did have some increased peribronchial thickening.  Her white count  is normal at 5200, hemoglobin 12.8, hematocrit 37 and the rest of her  comprehensive metabolic studies were normal.   HOSPITAL COURSE:  The patient is admitted with chest pain without any  musculoskeletal signs.  Etiology of which was in doubt on admission.  SAO2  was 100 on room air as noted above.  She had a nicely elevated increase in  CK, but she had no abnormalities on chest x-ray, and the pain was atypical  of coronary artery disease and angina.  An attempt to  exclude the  possibility of a GI source of her pain, and because the patient did develop  some tenderness in the epigastrium, a GI workup was initiated starting with  an abdominal ultrasound.  There was no evidence of any gallstones or  thickening of the gallbladder wall.  There was no evidence of any biliary  duct dilatation.  Her liver demonstrated some mild increased echogenicity  felt to be consistent with fatty infiltration.  However, there was no focal  liver masses noted.  She had a normal sized right kidney.  The left kidney  was normal in size without any hydronephrosis.  However, there was a complex  cyst in the mid pole of the right kidney.  In addition, there was a  hypoechoic mass located within the spleen and left kidney area, which  measured about 4 cm.  It appeared to be distinct and separate from the  kidney and the spleen.  It was felt that this possibly may have represented  an accessory spindle.  A CT scan was done to further  identify this  abnormality.  A CT scan done with and without contrast was performed and did  indicate 47x44x46 mm left upper lobe mass contiguous of the spleen with  surrounding adenopathy, and raised concern for lymphoma.  The CT of the  pelvis was completely unremarkable except for some mild enlargement of her  uterus.  A subsequent MRI of her abdomen revealed an entomogenous mass  immediately adjacent to the inferior aspect of the spleen and the tip of the  tail of the pancreas.  The precise site of origin could not be determined.  It was noted that there was fairly extensive adenopathy surrounding the  lesion and extending both anterior and posterior to the pancreas and into  the gastrohepatic space.  This too was felt to be consistent with a lymphoma  or a pancreatic neoplasm, although, there was no evidence of liver mets.  The patient was seen by the oncology service and subsequently underwent a  left upper quadrant biopsy which was unfortunately nondiagnostic.  The flow  cytometric analysis demonstrated predominantly B-cells with the minor  component of T-cells.  No definitive evidence of lymphoma was found.  The  tissue fragments, however, did show some background of atypical lymphoid  infiltrates, and these abnormalities were felt to be significant enough to  ask the patient to proceed with a more definitive biopsy, even up to the  point of open biopsy.  This, however, was not felt to be a procedure that  needed to be done on this admission, and she was subsequently discharged to  be followed up by the Oncology Service, Dr. Darnelle Catalan, who would schedule the  patient for a general surgical evaluation for subsequent biopsy.  The  patient's chest pain did resolve during her hospital stay, although, there  were many doubts as to whether or not this particular pain was being cause  by the left upper quadrant abnormality noted on her ultrasound and subsequently confirmed on CT and  MRI.   CONDITION ON DISCHARGE:  Improved.   PROGNOSIS:  Cannot be determined based on the fact that a diagnosis was not  made regarding the left upper quadrant mass.   PLAN:  She is to be scheduled for a PET scan along with open biopsy as an  outpatient.  She is scheduled to see Dr. Darnelle Catalan in two weeks.  She is to  start on Vicodin one p.o. q.4h. p.r.n.  PC/MEDQ  D:  12/07/2004  T:  12/07/2004  Job:  [id number]

## 2010-09-15 NOTE — Procedures (Signed)
[facility]  Patient:    Sheryl [name], Sheryl [name]                     MRN: [id number] Proc. Date: 06/21/00 Adm. Date:  [id number] Disc. Date: [id number] Attending:  Orland Mustard CC:         Lindell Spar. Chestine Spore, M.D.   Procedure Report  PROCEDURE:  Colonoscopy.  MEDICATIONS:  Fentanyl 100 mcg, Versed 10 mg IV.  SCOPE:  Olympus video colonoscope.  INDICATIONS FOR PROCEDURE:  Strong family history of colon polyps with recent rectal bleeding.  DESCRIPTION OF PROCEDURE:  The procedure had been explained to the patient and consent obtained. With the patient in the left lateral decubitus position, the Olympus scope was inserted and advanced under direct visualization. The prep was excellent. We were able to advance to the cecum using abdominal pressure and position changes. The ileocecal valve and appendiceal orifice were seen. The scope withdrawn. The cecum, ascending colon, hepatic flexure, transverse colon, splenic flexure, descending and sigmoid colon were seen well upon removal. No polyps seen throughout and no diverticular disease. Internal hemorrhoids seen in the rectum. The scope withdrawn, the patient tolerated the procedure well.  ASSESSMENT:  1. Internal hemorrhoids.  2. No evidence of colon polyps.  PLAN:  Will recommend repeating procedure in five years. Will give hemorrhoid instruction and will see on an as needed basis. DD:  06/21/00 TD:  06/24/00 Job: [id number]

## 2010-09-15 NOTE — Consult Note (Signed)
NAME:  Sheryl [name], Sheryl [name] NO.:  [id number]   MEDICAL RECORD NO.:  [id number]          PATIENT TYPE:  INP   LOCATION:  3728                         FACILITY:  [facility]   PHYSICIAN:  Valentino Hue. Magrinat, M.D.DATE OF BIRTH:  Jul 21, 1965   DATE OF CONSULTATION:  10/02/2004  DATE OF DISCHARGE:                                   CONSULTATION   REASON FOR CONSULTATION:  Left upper abdominal mass.   HISTORY OF PRESENT ILLNESS:  Sheryl [name] is a delightful 45 year old  [place] woman, admitted on September 28, 2004 with left anterior chest pain and  diaphoresis, with negative cardiac workup.  Chest x-ray was negative for  acute infiltrates or cardiomegaly or edema.  However, since she complained  of upper abdominal pain with radiation to her left flank, an ultrasound of  the abdomen was performed, showing fatty infiltration of the liver, with a 3  cm complex cyst of the left kidney and a 4 cm solid mass between the spleen  and the left kidney.  This was further investigated with a CT of the abdomen  on September 29, 2004 with contrast, revealing a 22 x 32 mm left renal cyst,  unremarkable liver, no gallstones, normal pancreas but, a 47 x 44 x 46 mm  enhancing irregular lesion lateral to the left kidney above the pancreatic  tail, but below the spleen, with adjacent lymphadenopathy.  This was  worrisome for possible neoplasm versus lymphoma.  The small and large bowel  were normal.  No retroperitoneal lymphadenopathy was seen.  PET scan is  scheduled for the morning.   PAST MEDICAL HISTORY:  Otherwise unremarkable.   SURGERIES:  1.  Status post tubal ligation.  2.  Status post left cataract surgery.   ALLERGIES:  NO KNOWN DRUG ALLERGIES.   CURRENT MEDICATIONS:  1.  Xanax 0.25 mg b.i.d.  2.  Avapro 150 mg daily.  3.  Tylenol p.r.n.  4.  Morphine sulfate 2-5 mg IV q.2 hours p.r.n.  5.  Senokot p.r.n.  Of note, the patient was on no medications on admission.   REVIEW OF SYSTEMS:   Remarkable for intermittent low-grade fever, reaching 99-  99.6 degrees.  No chills.  No night sweats, fatigue or weight loss.  No  anorexia, vision changes since her last cataract surgery.  No headaches or  shortness of breath.  She has mild dyspnea on exertion believed to be due to  lack of activity.  No cough or sputum production.  Her chest pain is  negative at this time.  She did have some episodes of what is believed to be  GERD symptoms, which were relieved after the use of antacids.  No  palpitations.  She has left abdominal pain, worse with movement, with no  changes since admission.  This pain radiates to the left flank area.  No  nausea, vomiting or diarrhea.  No constipation.  No dysphagia, hematemesis,  or blood in the stools.  No dysuria or gross hematuria.  No myalgias.  She  does have some numbness and tingling in the left arm, and left fingers, for  the last two years.   FAMILY HISTORY:  Mother alive with a history of diabetes, hypertension and  CAD.  Father alive in good health.  She has two sisters in good health and  two brothers healthy as well.  Her grandmother and one aunt died with  metastatic breast cancer to the brain.  One uncle died with liver cancer and  one uncle died with stomach cancer.   SOCIAL HISTORY:  The patient is married to her husband Billey Gosling, telephone  number [phone], and has two grown children in good health ages 87 and 2.  She is a Metallurgist.  No alcohol or tobacco history.  She lives  in [place].  She has two years of college.  Her religion is Personal [employer].  On  health maintenance, her last flu vaccine was in 2005, no pneumonia vaccine  received.  Last mammogram on October 19, 2003, negative.  Her last menstrual  period was on Sep 20, 2004, normal.   PHYSICAL EXAMINATION:  This is a well-developed, moderately obese 45-year-  old black female in no acute distress, alert and oriented x3.  Blood  pressure 126/62, pulse 73,  respirations 20, temperature 98.4, pulse oximetry  96% in room air, weight 202.9, height 5 foot 8 inches.  HEENT:  Normocephalic and atraumatic.  PERRLA.  Oral mucosa without thrush  or lesions.  NECK:  Supple, no cervical or supraclavicular masses.  LUNGS:  Essentially clear to auscultation bilaterally with no axillary  masses.  BREASTS:  Remarkable for tenderness to palpation on the left inner quadrant  area towards the areola, with no discrete mass.  CARDIOVASCULAR:  Regular rate and rhythm, without murmurs, rubs or gallops.  ABDOMEN:  Moderately obese, soft, tender in the left upper quadrant, bowel  sounds x4.  No palpable spleen or liver.  GYN/RECTAL:  Deferred.  EXTREMITIES:  With no clubbing or cyanosis, no edema.  SKIN:  Without lesions, bruising or petechiae.  NEUROLOGIC:  Nonfocal.   LABS:  Hemoglobin 12.4, hematocrit 36.9, white count 4.6, platelets 364,  neutrophil 82.7, MCV 3.4, ASR 6, sodium 138, potassium 3.9, BUN 10,  creatinine 0.9, glucose 109, total bilirubin 0.7, alkaline phosphatase 72,  AST 25, ALT 21, total protein 6.3, albumin 3.6, calcium 9.2, D-Dimer on Sep 27, 2004 was less than 0.22, troponin I on Sep 27, 2004 was 0.01.   ASSESSMENT/PLAN:  Sheryl [name] is a delightful 45 year old [place] woman,  found to have a left 4 cm irregular solid mass between the left kidney and  spleen, with adjacent lymphadenopathy, raising the question of a neoplasm  versus lymphoma.  PET scan is scheduled for tomorrow.  Agree with biopsy of  the mass for definite diagnosis.  Dr. Briant Cedar is to see the patient later  this evening, to meet the patient, and to answer any questions that the  patient may have.  We will arrange an outpatient followup with all the  database obtained, on October 24, 2004 at 4:45 p.m.  If the time and date are  inconvenient to the patient, earlier appointment with either MD from our group can be arranged.  If she needs to change this appointment, she  will  call the [facility], telephone number [phone], for further  arrangements.  Thank you very much for allowing [place] the opportunity to  participate in the care of Ms. Fandino.      SW/MEDQ  D:  10/02/2004  T:  10/02/2004  Job:  [id number]

## 2010-09-15 NOTE — Op Note (Signed)
NAME:  Sheryl [name], Sheryl [name] NO.:  [id number]   MEDICAL RECORD NO.:  [id number]          PATIENT TYPE:  AMB   LOCATION:  [facility]                         FACILITY:  [facility]   PHYSICIAN:  Timothy E. Earlene Plater, M.D. DATE OF BIRTH:  [birth year]   DATE OF PROCEDURE:  08/01/2006  DATE OF DISCHARGE:                               OPERATIVE REPORT   PREOP DIAGNOSIS:  Anal polyp.   POSTOPERATIVE DIAGNOSIS:  Anal polyp.   PROCEDURE:  Excision of anal polyp.   SURGEON:  Timothy E. Earlene Plater, M.D.   ANESTHESIA:  Local standby.   INDICATIONS:  Ms. Limones had a colonoscopy and an anal polyp was seen.  She was aware of the polyp, though it is not very symptomatic.  She does  want it removed.  She was seen, identified, and the permit signed.   DESCRIPTION OF PROCEDURE:  The patient taken to the operating room,  placed supine, IV sedation given.  She was placed in lithotomy.  Perianal area prepped and draped.  The anoscope was placed into the anal  canal.  A large anal polyp exactly posterior, over 1 cm in diameter was  seen.  There were no other lesions.   This area was anesthetized with 1/4% Marcaine with epinephrine.  That  was massaged in well.  The polyp was grasped a suture ligature was  placed in its apex, the entire polyp removed; and the subsequent wound  closed with a running 3-0 chromic.  No bleeding or complication;  sphincter is intact.   Gelfoam gauze and dry sterile dressing applied.  The polyp to the lab.  Counts correct.  She tolerated it well and is removed to recovery room  in good condition.  Written and verbal instructions given her and her  family; and she will be seen and followed as an outpatient.Sheppard Plumber. Earlene Plater, M.D.  Electronically Signed     TED/MEDQ  D:  08/01/2006  T:  08/01/2006  Job:  [id number]   cc:   Valentino Hue. Magrinat, M.D.  Fax: [phone]   Llana Aliment. Malon Kindle., M.D.  Fax: [phone]   Adolph Pollack, M.D.  [address]  [place]  [place] [address]

## 2010-09-15 NOTE — Op Note (Signed)
NAME:  Sheryl [name], Sheryl [name] NO.:  [id number]   MEDICAL RECORD NO.:  [id number]          PATIENT TYPE:  OIB   LOCATION:  1615                         FACILITY:  [facility]   PHYSICIAN:  Adolph Pollack, M.D.DATE OF BIRTH:  1965-11-11   DATE OF PROCEDURE:  12/28/2004  DATE OF DISCHARGE:                                 OPERATIVE REPORT   PREOPERATIVE DIAGNOSIS:  Left upper quadrant abdominal mass.   POSTOPERATIVE DIAGNOSIS:  Left upper quadrant abdominal mass.   PROCEDURE:  1.  Laparoscopic biopsy of left upper quadrant lymphadenopathy.  2.  Laparoscopic biopsy of left upper quadrant mass.  3.  Mobilization of splenic flexure.   SURGEON:  Dr. Abbey Chatters.   ASSISTANT:  Dr. Consuello Bossier   ANESTHESIA:  General.   INDICATIONS:  This is a 45 year old female who was having some chest pain  and diaphoresis and also some left upper quadrant pain, presented to the  hospital, had a negative cardiac workup, had a CT scan performed which  demonstrated a 4.7 cm mass in the left upper quadrant inferior to the spleen  and superior and anterior to the tail of the pancreas.  A large core biopsy  was performed under radiologic guidance, and lymphoid type tissue was  obtained, although no definitive diagnosis could be made.  She now presents  for laparoscopic possible open biopsy of the mass.  Procedure and risks were  discussed with her preoperatively.   TECHNIQUE:  She is seen holding area then brought to the operating room and  placed supine on the operating table, and general anesthetic was  administered.  She is then placed into the beanbag, and the left side was  rotated up at about 30 degrees.  The abdominal wall was then sterilely  prepped and draped.  She was placed in a supine position.  A supraumbilical  incision was made through skin, subcutaneous tissue, fascia, and peritoneum.  Marcaine solution had been infiltrated prior to making the incision.  A  pursestring  suture of 0 Vicryl was placed around the fascial edges.  A  Hassan trocar was introduced into the peritoneal cavity, and a  pneumoperitoneum was created by insufflation of CO2 gas.   Next, a laparoscope was introduced.  Under direct vision, an epigastric  incision was made and a 10 mm trocar placed in the abdominal cavity.  Two  left mid abdominal incisions were made, and two 10 mm trocars were placed in  the left mid abdomen, midclavicular line, and then left mid abdomen anterior  axillary line.   I began by mobilizing some omental adhesions to the left upper quadrant  region and then mobilizing the proximal portion of the descending colon.  I  then divided some filmy attachments between the omentum and the spleen along  the spleen to retract superiorly.  I then used the Omega manuever to  mobilize the splenic flexure and have it drop inferiorly, thus exposing a  mass with an adjacent enlarged lymph node.  Using the harmonic scalpel and  careful blunt dissection, I was able to dissect the lymph node away from the  mass, excise it, put it in an Endopouch bag, and remove it from the  abdominal cavity.  This was sent for frozen section and was consistent with  lymphoid type tissue; further workup will be performed on it.   Following this, I then isolated the mass in the left upper quadrant and  sharply performed a biopsy of the mass and sent this as well to pathology  which again showed atypical lymphoid type tissue.  Bleeding was controlled  with electrocautery and FloSeal on the mass.  Once hemostasis was adequate,  I then placed the patient in a supine position and evacuated a small amount  of bleeding that had occurred.  I then removed all the trocars and released  the pneumoperitoneum.  The supraumbilical fascial defect was closed by  tightening up and tying down the pursestring suture.  The skin incisions  were closed with 4-0 Monocryl subcuticular stitches followed by Steri-Strips   and sterile dressings.   She tolerated the procedure well without any apparent complications and was  taken to the recovery room in satisfactory condition.      Adolph Pollack, M.D.  Electronically Signed     TJR/MEDQ  D:  12/28/2004  T:  12/28/2004  Job:  [id number]   cc:   Valentino Hue. Magrinat, M.D.  [address] [facility]  [place]  [place] [address]  Fax: [phone]   Margaretmary Bayley, M.D.  [address], [address]  [place]  [place] [address]  Fax: [phone]

## 2010-09-17 NOTE — Op Note (Signed)
NAME:  Sheryl [name], Sheryl [name] NO.:  [id number]  MEDICAL RECORD NO.:  [id number]           PATIENT TYPE:  O  LOCATION:  9316                          [facility]  PHYSICIAN:  Maxie Better, M.D.DATE OF BIRTH:  1965-09-16  DATE OF PROCEDURE:  08/25/2010 DATE OF DISCHARGE:                              OPERATIVE REPORT   PREOPERATIVE DIAGNOSES:  Dysfunctional uterine bleeding, uterine fibroids, possible right hydrosalpinx.  PROCEDURE:  DaVinci robotic assisted  total laparoscopic hysterectomy.  POSTOPERATIVE DIAGNOSES:  Dysfunction uterine bleeding, uterine fibroids.  ANESTHESIA:  General.  SURGEON:  Maxie Better, MD.  ASSISTANT: Shea Evans, MD.  INTRAOPERATIVE CONSULT:  Cherylynn Ridges, MD.  DESCRIPTION OF PROCEDURE:  Under general anesthesia, the patient was placed in the dorsal lithotomy position.  Examination under anesthesia revealed an irregular retroflexed uterus about 10-11 weeks size.  No adnexal masses could be appreciated.  The patient was then sterilely prepped and draped in usual fashion.  Indwelling Foley catheter was sterilely placed.  A weighted speculum was placed in the vagina.  A Sims retractor was placed anteriorly.  The cervix was grasped with a single- tooth tenaculum.  It was measured for small Rumi KOH ring.  The uterus sounded to 9 cm and #8 introducer was then inserted in a retroflexed fashion, and the balloon was filled.  The KOH ring was seated around the cervix very well.  The instruments were then removed from the vagina. The gloves was changed.  Attention was then turned to the abdomen where the patient had a previous supraumbilical and small vertical incision. Quarter percent Marcaine was injected at that site.  Incision was then made.  Veress needle was introduced, tested for adequate placement. Subsequently 4.5 liters of CO2 was insufflated.  Veress needle was then removed.  A 12-mm trocar with sleeve was then  introduced into the abdomen. On taking of the introducer, blood was noted in the midshaft of the trocar base.  At that point, robotic camera was introduced through the 12-mm port.  Entering the abdomen, blood was noted suggestive of an injury.  At that point to further assess the injury, two additional robotic ports were then placed on the left side in the usual distance and both 8-mm sizes were placed.  Under direct visualization, the pelvis was then inspected.  It was subsequently noted that there was a rent in the bowel mesentery and posterior to that as well.  They themselves were not actively bleeding.  However, there was blood in the pelvis.  The first step at that point as well was to also inspect the uterus for evidence of trauma.  The blood was suctioned out of the pelvis.  The uterus was lifted, inspected, and noted to be fibrotic, had no clear evidence of any injury.  The adnexa results were okay.  At that point, further inspection was then made to delineate the extent of any other injury, two other small rents were noted.  General surgeon were then called and instruments were standby for possible  opening of the patient was done.  The patient from her vital signs standpoint was stable.  Her  blood pressure did not change.  Her pulse was not elevated.  While awaiting for the general surgeon, careful continued close monitoring of the patient was done with respect to intra-abdominal bleed and did not appear to be any active bleeding.  The abdomen was already suctioned as was the pelvis, which was at that time 300 mL of her blood.  Dr. Lindie Spruce was kind enough to arrive for the General Surgeon's presence and inspected the sites. Please see his dictated note for laparoscopic standpoint.  Surgicel was subsequently placed and decision was then made to proceed with the robotic portion of the surgery.  The third robotic port was placed on the right side and the assistant port was also  placed superior to the robotic camera site at the 12-mm site.  Once all the robotic instruments were placed, and all the port sites were in place, the robot was brought to the patient and was docked.  Instruments were placed through those three ports under direct visualization of the camera.  Once all the instruments were in the field, I then turned to the surgical robotic console.  The uterus was again noted to be with multiple fibroids.  Both tubes are evidenced per previous surgical interruption, on the left it appeared to be more of Filshie clip or a clamp on the tube that got caught from an adhesion standpoint.  The ovary on the left was adherent to the site of the uterus.  The right tube did not have any evidence of endometriosis, although it was a little bit distorted, and the ovary on the right was normal.  The ureter on the right was then inspected and found to be peristalsing.  The right ovary had basically no utero- ovarian ligament length, it was next to a subserosal fibroid.  The retroperitoneal space on the right was then opened.  The round ligament was clamped, cauterized, and then cut.  The right tube attachment to the uterus was then clamped, cauterized, and cut given more mobility to get to the ovary which was planned to be preserved.  The ovary was subsequently separated from its attachment to the uterus and the dissection was continued down on the right.  The anterior peritoneum from the bladder was opened and dissection of the bladder off the KOH ring was done.  The uterine vessels were skeletonized and they were serially clamped, cauterized, but not cut at that point.  Attention was then turned to the contralateral side.  The ureter was then identified, peristalsing well, and the round ligament on the left was clamped, cauterized, and cut.  Opening the remaining portion of the bladder peritoneum and carried the bladder dissection down further posteriorly, the  adhesions were taken down off the left tube and ovary.  The ovary itself also was taken off the uterus with cauterization and dissection with a PK dissector and scissors.  Once it was done, the uterine vessels on the left were also skeletonized, clamped, cauterized, cut.  At that point, it was then felt that the uterus could be separated from its vaginal attachment.  This was then done with the scissors circumferentially and taking care of bleeding as we get along.  The uterus was then separated from its attachment.  The pneumo balloon in the vagina had been inflated prior to severing the attachment of the uterus to the vagina.  Bleeding on the cuff was carefully cauterized. The vaginal cuff was then closed with V-lock suture in a running fashion vaginally.  This was  palpated to make sure that there was no defects and it was intact.  The pelvis was then irrigated and suctioned.  Good hemostasis noted.  The pedicles were inspected though some bleeding on the right pedicle which was then cauterized.  At that point, all the robotic instruments were then removed.  The robot was then undocked and the Storz morcellator was then inserted in the assistant port site.  The specimen was held and morcellated.  Due to the angle of the assistant port, the morcellation took a little bit longer because of the size. Once the specimen was completely morcellated, the pelvis was irrigated. The sites where Surgicel has been placed by Dr. Lindie Spruce over the rents were inspected.  Additional Surgicel was placed in the areas where the Surgicel had been detached, but there was no active bleeding.  The pelvis was again inspected.  Good hemostasis was noted.  Upper abdomen was otherwise normal.  All ports were then removed under direct visualization and the incision to 12-mm ports were closed with a deep stitch of 0 Vicryl figure-of-eight suture in the fascial stitches and the skin approximated using 4-0 Vicryl for  subcuticular stitches and Dermabond.  The specimen weighed 302 grams.  Estimated blood loss was 400 mL.  Intraoperative fluid is 2100 mL crystalloid, 500 mL of Hextend. Urine output was 480 mL of clear yellow urine.  Sponge and instrument counts were correct.  Complication was trocar injury as described intraoperatively.  The patient tolerated the procedure well, was transferred to the recovery room in stable condition.  CBC and basic metabolic profile was then obtained in the recovery area.     Maxie Better, M.D.     [place]/MEDQ  D:  08/26/2010  T:  08/26/2010  Job:  [id number]  Electronically Signed by Nena Jordan Wyett Narine M.D. on 09/17/2010 02:40:01 PM

## 2010-09-17 NOTE — Op Note (Signed)
  NAME:  Sheryl [name], Sheryl [name] NO.:  [id number]  MEDICAL RECORD NO.:  [id number]           PATIENT TYPE:  O  LOCATION:  9319                          [facility]  [facility]  PHYSICIAN:  Maxie Better, M.D.DATE OF BIRTH:  03-20-1966  DATE OF PROCEDURE:  09/13/2010 DATE OF DISCHARGE:  09/13/2010                              OPERATIVE REPORT   PREOPERATIVE DIAGNOSIS:  Posthysterectomy vaginal bleeding, question vaginal cuff dehiscence.  PROCEDURE:  Exploration of vaginal cuff, repair of vaginal cuff dehiscence.  POSTOPERATIVE DIAGNOSIS:  Posthysterectomy vaginal bleeding, vaginal cuff dehiscence.  ANESTHESIA:  MAC.  SURGEON:  Maxie Better, MD  ASSISTANT:  None.  PROCEDURE:  Under adequate monitored anesthesia, the patient is placed in the dorsal lithotomy position.  She was sterilely prepped and draped in the usual fashion.  Bladder was catheterized for moderate amount of urine.  Examination under anesthesia was gently performed with palpation of the vaginal cuff confirmed it  being opened.  A weighted speculum was placed in the vagina.  Sidewall retractors was utilized.  Small clots that was particularly on the left side of the vaginal cuff was removed.  The edges were grasped with Allis clamps.  The areas were explored right and left and interrupted 0 Vicryl figure-of-eight sutures were placed along the entire  dehisced vaginal cuff with good hemostasis subsequently noted.  There was no evidence of an active bleeder seen.  The vagina was then packed with 0.5-inch gauze soaked with estrogen.  The patient was then transferred to the recovery room.  Estimated blood loss for surgery itself was minimal.  Indwelling foley placed at end of case. Intraoperative fluid was 600 cc. Complication: None.  The patient tolerated the procedure well and was transferred to recovery in stable condition.     Maxie Better, M.D.     [place]/MEDQ  D:  09/13/2010  T:   09/14/2010  Job:  [id number]  Electronically Signed by Nena Jordan Arth Nicastro M.D. on 09/17/2010 02:46:31 PM

## 2010-09-18 ENCOUNTER — Inpatient Hospital Stay (HOSPITAL_COMMUNITY)
Admission: AD | Admit: 2010-09-18 | Discharge: 2010-09-20 | DRG: 908 | Disposition: A | Discharge status: Home / Self Care | Payer: PRIVATE HEALTH INSURANCE | Source: Ambulatory Visit | Attending: Obstetrics | Admitting: Obstetrics

## 2010-09-18 DIAGNOSIS — IMO0002 Reserved for concepts with insufficient information to code with codable children: Secondary | ICD-10-CM | POA: Diagnosis present

## 2010-09-18 DIAGNOSIS — T81329A Deep disruption or dehiscence of operation wound, unspecified, initial encounter: Principal | ICD-10-CM | POA: Diagnosis present

## 2010-09-18 DIAGNOSIS — Y838 Other surgical procedures as the cause of abnormal reaction of the patient, or of later complication, without mention of misadventure at the time of the procedure: Secondary | ICD-10-CM | POA: Diagnosis present

## 2010-09-18 DIAGNOSIS — R05 Cough: Secondary | ICD-10-CM | POA: Diagnosis present

## 2010-09-18 DIAGNOSIS — T8132XA Disruption of internal operation (surgical) wound, not elsewhere classified, initial encounter: Principal | ICD-10-CM | POA: Diagnosis present

## 2010-09-18 DIAGNOSIS — I82629 Acute embolism and thrombosis of deep veins of unspecified upper extremity: Secondary | ICD-10-CM | POA: Diagnosis present

## 2010-09-18 DIAGNOSIS — R059 Cough, unspecified: Secondary | ICD-10-CM | POA: Diagnosis present

## 2010-09-18 LAB — CBC
HCT: 29.2 % — ABNORMAL LOW (ref 36.0–46.0)
Hemoglobin: 9.6 g/dL — ABNORMAL LOW (ref 12.0–15.0)
MCH: 26.5 pg (ref 26.0–34.0)
MCHC: 32.9 g/dL (ref 30.0–36.0)
MCV: 80.7 fL (ref 78.0–100.0)
Platelets: 506 10*3/uL — ABNORMAL HIGH (ref 150–400)
RBC: 3.62 MIL/uL — ABNORMAL LOW (ref 3.87–5.11)
RDW: 14 % (ref 11.5–15.5)
WBC: 4.6 10*3/uL (ref 4.0–10.5)

## 2010-09-19 ENCOUNTER — Ambulatory Visit (HOSPITAL_COMMUNITY): Payer: PRIVATE HEALTH INSURANCE

## 2010-09-19 ENCOUNTER — Inpatient Hospital Stay (HOSPITAL_COMMUNITY): Payer: PRIVATE HEALTH INSURANCE

## 2010-09-19 LAB — PROTIME-INR
INR: 1.01 (ref 0.00–1.49)
Prothrombin Time: 13.5 seconds (ref 11.6–15.2)

## 2010-09-19 LAB — CBC
HCT: 28.8 % — ABNORMAL LOW (ref 36.0–46.0)
HCT: 27.8 % — ABNORMAL LOW (ref 36.0–46.0)
Hemoglobin: 9.4 g/dL — ABNORMAL LOW (ref 12.0–15.0)
Hemoglobin: 9.2 g/dL — ABNORMAL LOW (ref 12.0–15.0)
MCH: 27.1 pg (ref 26.0–34.0)
MCH: 27.3 pg (ref 26.0–34.0)
MCHC: 32.6 g/dL (ref 30.0–36.0)
MCHC: 33.1 g/dL (ref 30.0–36.0)
MCV: 83 fL (ref 78.0–100.0)
MCV: 82.5 fL (ref 78.0–100.0)
Platelets: 352 10*3/uL (ref 150–400)
Platelets: 358 10*3/uL (ref 150–400)
RBC: 3.47 MIL/uL — ABNORMAL LOW (ref 3.87–5.11)
RBC: 3.37 MIL/uL — ABNORMAL LOW (ref 3.87–5.11)
RDW: 15.5 % (ref 11.5–15.5)
RDW: 15.1 % (ref 11.5–15.5)
WBC: 4.9 10*3/uL (ref 4.0–10.5)
WBC: 6 10*3/uL (ref 4.0–10.5)

## 2010-09-19 LAB — FIBRINOGEN: Fibrinogen: 426 mg/dL (ref 204–475)

## 2010-09-19 LAB — ABO/RH: ABO/RH(D): O POS

## 2010-09-19 LAB — APTT: aPTT: 37 seconds (ref 24–37)

## 2010-09-20 LAB — CBC
HCT: 28.2 % — ABNORMAL LOW (ref 36.0–46.0)
HCT: 27.4 % — ABNORMAL LOW (ref 36.0–46.0)
Hemoglobin: 9.2 g/dL — ABNORMAL LOW (ref 12.0–15.0)
Hemoglobin: 9.1 g/dL — ABNORMAL LOW (ref 12.0–15.0)
MCH: 26.7 pg (ref 26.0–34.0)
MCH: 27.1 pg (ref 26.0–34.0)
MCHC: 32.6 g/dL (ref 30.0–36.0)
MCHC: 33.2 g/dL (ref 30.0–36.0)
MCV: 82 fL (ref 78.0–100.0)
MCV: 81.5 fL (ref 78.0–100.0)
Platelets: 390 10*3/uL (ref 150–400)
Platelets: 360 10*3/uL (ref 150–400)
RBC: 3.44 MIL/uL — ABNORMAL LOW (ref 3.87–5.11)
RBC: 3.36 MIL/uL — ABNORMAL LOW (ref 3.87–5.11)
RDW: 14.9 % (ref 11.5–15.5)
RDW: 14.9 % (ref 11.5–15.5)
WBC: 5 10*3/uL (ref 4.0–10.5)
WBC: 4.7 10*3/uL (ref 4.0–10.5)

## 2010-09-22 LAB — TYPE AND SCREEN
ABO/RH(D): O POS
Antibody Screen: NEGATIVE
Unit division: 0
Unit division: 0
Unit division: 0
Unit division: 0

## 2010-09-24 NOTE — Op Note (Signed)
NAME:  Sheryl [name], Sheryl [name] NO.:  [id number]  MEDICAL RECORD NO.:  [id number]           PATIENT TYPE:  I  LOCATION:  9303                          [facility]  PHYSICIAN:  Lendon Colonel, MD   DATE OF BIRTH:  08-10-1965  DATE OF PROCEDURE:  09/19/2010 DATE OF DISCHARGE:                              OPERATIVE REPORT   PREOPERATIVE DIAGNOSES:  Postoperative vaginal bleeding, vaginal cuff separation.  POSTOPERATIVE DIAGNOSES:  Postoperative vaginal bleeding, vaginal cuff separation.  PROCEDURES:  Exam under anesthesia, repair of vaginal cuff separation.  SURGEON:  Lendon Colonel, MD  ASSISTANT:  None.  ANESTHESIA:  General.  FINDINGS:  Left angle of the vaginal cuff with active bleeding, tear in the vaginal cuff, prior sutures intact but pulled through the tissue.  SPECIMENS:  None.  ANTIBIOTICS:  One gram of Ancef.  ESTIMATED BLOOD LOSS:  1200 mL.  COMPLICATIONS:  None.  INDICATIONS:  This is a 45 year old postoperative robotic-assisted total laparoscopic hysterectomy who presented with acute onset of heavy bright red vaginal bleeding about an hour and half before arrival.  The patient on exam had stable vital signs, but active bleeding and decision was made to proceed to the OR for repair of the vaginal cuff separation.  PROCEDURE:  After informed consent was obtained, the patient was taken to the operating room where general anesthesia was initiated without difficulty.  She was prepped and draped in a normal sterile fashion in a dorsal supine lithotomy position.  A Foley catheter was inserted sterilely into the bladder and large amount of clot was cleared out of the vagina. From the time the patient had presented to MAU until the end of the case, there was approximately 1200 mL of blood loss, mostly in clot blood.  This was removed.  A weighted speculum was placed into vagina.  The patient was placed in Trendelenburg position.  A  Deaver retractor was used to deviate the anterior vaginal wall and the cuff was evaluated while the right side was intact and hemostatic.  There was a defect in the left angle of the cuff.  Allis clamps were placed on the vaginal mucosa close to the edge of vaginal separation.  Attempt was made to evert these edges. The Allis clamps were further used to walk the separated area out to the angle and back towards the middle, and the decision was made to oversew this cuff defect with 0 Vicryl on a CT-1.  Two figure-of-eight sutures were placed, one at the angle, one just to the midline of the angle with good repair.  At this point, further tear was seen just inside the left angle and additional figure-of-32 suture was placed at this point and tied down.  Excellent hemostasis was noted.  The cuff was left off and on tension and remained hemostatic.  At this point, the decision was made to leave the Foley catheter in, pack the vagina with the gauze coated in Vaseline.  This was done.  The instruments were removed.  The laparoscopic port sites from the prior surgery were inspected.  The right lower quadrant incision was evaluated.  There was some exudate in the incision, however, this was wiped clean.  Good granulation tissue was seen underneath and the area was bandaged put over it.  The patient was then awakened from general anesthesia having tolerated the procedure well. Sponge, lap, and needle counts were correct x2 and the patient was taken to the recovery room in stable condition.     Lendon Colonel, MD     [facility]/MEDQ  D:  09/19/2010  T:  09/19/2010  Job:  [id number]  Electronically Signed by Noland Fordyce MD on 09/24/2010 01:19:58 PM

## 2010-10-02 ENCOUNTER — Ambulatory Visit (HOSPITAL_COMMUNITY)
Admission: RE | Admit: 2010-10-02 | Discharge: 2010-10-02 | Disposition: A | Discharge status: Home / Self Care | Payer: PRIVATE HEALTH INSURANCE | Source: Ambulatory Visit | Attending: Obstetrics and Gynecology | Admitting: Obstetrics and Gynecology

## 2010-10-02 DIAGNOSIS — I808 Phlebitis and thrombophlebitis of other sites: Secondary | ICD-10-CM | POA: Insufficient documentation

## 2010-10-02 DIAGNOSIS — Z86718 Personal history of other venous thrombosis and embolism: Secondary | ICD-10-CM

## 2010-10-17 NOTE — Consult Note (Signed)
  NAME:  Sheryl [name], Sheryl [name] NO.:  [id number]  MEDICAL RECORD NO.:  [id number]           PATIENT TYPE:  O  LOCATION:  9316                          [facility]  PHYSICIAN:  Cherylynn Ridges, M.D.    DATE OF BIRTH:  15-Jun-1965  DATE OF CONSULTATION:  08/25/2010 DATE OF DISCHARGE:  08/27/2010                                CONSULTATION   Patient of Dr. Maxie Better.  I was paged by our office to assist an OB/GYN surgeon at [facility] because of an intraoperative a potential complication.  I went to the operative suite at [facility] where there was concern by Dr. Maxie Better that the patient had a significant injury to the small bowel mesentery and possibly the bowel.  Under laparoscopic examination and running of the bowel and looking at the mesentery with those minimal bleeding, I felt though that the risk for continued bleeding was minimal that there was no evidence of any bowel injury and that the patient will likely do well without any need for bowel resection or repair.  No further exploration was necessary and I let Dr. Cherly Hensen go ahead and continue procedure.     Cherylynn Ridges, M.D.     [facility]OW/MEDQ  D:  09/27/2010  T:  09/28/2010  Job:  [id number]  Electronically Signed by Jimmye Norman M.D. on 10/17/2010 07:58:11 AM

## 2010-11-02 NOTE — Discharge Summary (Signed)
NAME:  Sheryl [name], MENCHACA NO.:  [id number]  MEDICAL RECORD NO.:  [id number]  LOCATION:  9303                          [facility]  PHYSICIAN:  Maxie Better, M.D.DATE OF BIRTH:  1965/12/31  DATE OF ADMISSION:  09/18/2010 DATE OF DISCHARGE:  09/20/2010                              DISCHARGE SUMMARY   ADMISSION DIAGNOSES: 1. Postoperative vaginal bleeding. 2. Post hysterectomy vaginal cuff separation/dehiscence. 3. Anemia secondary to acute blood loss. 4. Left basilic vein thrombosis. 5. Chronic cough. 6. Chronic hypertension.  DISCHARGE DIAGNOSES: 1. Postoperative vaginal bleeding. 2. Post hysterectomy with vaginal cuff dehiscence. 3. Anemia 4. Left basilic vein thrombosis. 5. Chronic cough. 6. Chronic hypertension.  PROCEDURES:  Sep 19, 2010; vaginal cuff repair, examination under anesthesia Lendon Colonel, MD), blood transfusion.  HISTORY OF PRESENT ILLNESS:  This is a 45 year old para 2, married black female, who is status post robotic da Vinci TLH on August 25, 2010, who presented with a heavy vaginal bleeding.  The patient's history was notable for previous  vaginal cuff separation for which she underwent repair on Sep 13, 2010.  Post TLH, the patient was diagnosed with a left basilic vein thrombosis and has been on Lovenox for anticoagulation.  The patient presented again today on Sep 18, 2010 with heavy vaginal bleeding.  Her hemoglobin on Sep 13, 2010 had been 9.7.  The patient denied intercourse, had had nothing per vagina since her original surgery.  The patient has been having chronic nightly cough.  HOSPITAL COURSE:  The patient was seen by Dr. Ernestina Penna.  Her blood pressure was 142/97, temperature of 99.  She had apparently active right vaginal bleeding.  The cuff appeared to be intact, but active bleeding obscured the view.  The patient was therefore taken to the operating room for further examination and repair.  Please see the  dictated operative note for the specific details.  The patient was also noted to have a chronic cough, which may have compromised the vaginal cuff separation.  This was possibly due to the ACE inhibitor, which she takes for her hypertension.  Her Lovenox was placed on hold for her upper extremity DVT due to the ongoing bleeding.  The patient's CBC on Sep 18, 2010 showed a hemoglobin of 9.6, white count of 4.6, hematocrit 29.2, platelet count of 506,000.  The patient was given blood transfusion per Dr. Ernestina Penna.  On Sep 19, 2010, her hemoglobin was 9.4, hematocrit 28.8, white count of 4.9, platelet count of 352,000.  The patient had her vaginal packing removed on Sep 19, 2010 and her bleeding monitored.  Her Lovenox was resumed.  Pelvic ultrasound was done, which did not show any collection in the pelvis.  The patient was started on Protonix.  Her pad had no bleeding on Sep 20, 2010.  Her abdomen was soft.  She had active bowel sounds.  She was nontender.  She was afebrile.  Repeat CBC on Sep 20, 2010 showed hemoglobin of 9.1, white count of 4.7, hematocrit of 27.4, platelet count of 360,000.  The patient was deemed well to be discharged home.  Plan was to stop the ACE inhibitor, blood pressure medicine.  DISPOSITION:  Home.  CONDITION:  Stable.  DISCHARGE MEDICATIONS:  Protonix 40 mg p.o. daily, Lovenox 140 mg subcu daily.  Continue all her other medications( see home medication reconciliation list) except for the ACE inhibitor.  Follow up appointment at [facility] on September 29, 2010.  DISCHARGE INSTRUCTIONS:  Return for vaginal bleeding, nausea, vomiting, fever 100.4 or higher, passage of clots, no straining with bowel movement.     Maxie Better, M.D.     [place]/MEDQ  D:  11/01/2010  T:  11/01/2010  Job:  [id number]  Electronically Signed by Nena Jordan Dondi Aime M.D. on 11/02/2010 06:31:00 AM

## 2010-12-07 ENCOUNTER — Ambulatory Visit (HOSPITAL_COMMUNITY): Payer: PRIVATE HEALTH INSURANCE | Attending: Hematology and Oncology

## 2010-12-12 ENCOUNTER — Other Ambulatory Visit: Payer: Self-pay | Admitting: Hematology and Oncology

## 2010-12-12 ENCOUNTER — Ambulatory Visit (HOSPITAL_COMMUNITY)
Admission: RE | Admit: 2010-12-12 | Discharge: 2010-12-12 | Disposition: A | Discharge status: Home / Self Care | Payer: PRIVATE HEALTH INSURANCE | Source: Ambulatory Visit | Attending: Hematology and Oncology | Admitting: Hematology and Oncology

## 2010-12-12 ENCOUNTER — Encounter (HOSPITAL_BASED_OUTPATIENT_CLINIC_OR_DEPARTMENT_OTHER): Payer: PRIVATE HEALTH INSURANCE | Admitting: Hematology and Oncology

## 2010-12-12 DIAGNOSIS — R0602 Shortness of breath: Secondary | ICD-10-CM

## 2010-12-12 DIAGNOSIS — C3490 Malignant neoplasm of unspecified part of unspecified bronchus or lung: Secondary | ICD-10-CM

## 2010-12-12 DIAGNOSIS — Z85118 Personal history of other malignant neoplasm of bronchus and lung: Secondary | ICD-10-CM | POA: Insufficient documentation

## 2010-12-12 DIAGNOSIS — R1902 Left upper quadrant abdominal swelling, mass and lump: Secondary | ICD-10-CM

## 2010-12-12 DIAGNOSIS — D539 Nutritional anemia, unspecified: Secondary | ICD-10-CM

## 2010-12-12 DIAGNOSIS — R52 Pain, unspecified: Secondary | ICD-10-CM | POA: Insufficient documentation

## 2010-12-12 DIAGNOSIS — I82509 Chronic embolism and thrombosis of unspecified deep veins of unspecified lower extremity: Secondary | ICD-10-CM

## 2010-12-12 DIAGNOSIS — R079 Chest pain, unspecified: Secondary | ICD-10-CM | POA: Insufficient documentation

## 2010-12-12 DIAGNOSIS — Z86718 Personal history of other venous thrombosis and embolism: Secondary | ICD-10-CM | POA: Insufficient documentation

## 2010-12-12 LAB — CBC WITH DIFFERENTIAL/PLATELET
BASO%: 0.4 % (ref 0.0–2.0)
Basophils Absolute: 0 10*3/uL (ref 0.0–0.1)
EOS%: 2 % (ref 0.0–7.0)
Eosinophils Absolute: 0.1 10*3/uL (ref 0.0–0.5)
HCT: 32.4 % — ABNORMAL LOW (ref 34.8–46.6)
HGB: 10.6 g/dL — ABNORMAL LOW (ref 11.6–15.9)
LYMPH%: 32.1 % (ref 14.0–49.7)
MCH: 23.4 pg — ABNORMAL LOW (ref 25.1–34.0)
MCHC: 32.8 g/dL (ref 31.5–36.0)
MCV: 71.4 fL — ABNORMAL LOW (ref 79.5–101.0)
MONO#: 0.3 10*3/uL (ref 0.1–0.9)
MONO%: 8.3 % (ref 0.0–14.0)
NEUT#: 2.1 10*3/uL (ref 1.5–6.5)
NEUT%: 57.2 % (ref 38.4–76.8)
Platelets: 396 10*3/uL (ref 145–400)
RBC: 4.53 10*6/uL (ref 3.70–5.45)
RDW: 19.5 % — ABNORMAL HIGH (ref 11.2–14.5)
WBC: 3.6 10*3/uL — ABNORMAL LOW (ref 3.9–10.3)
lymph#: 1.2 10*3/uL (ref 0.9–3.3)

## 2010-12-12 LAB — IRON AND TIBC
%SAT: 7 % — ABNORMAL LOW (ref 20–55)
Iron: 28 ug/dL — ABNORMAL LOW (ref 42–145)
TIBC: 383 ug/dL (ref 250–470)
UIBC: 355 ug/dL

## 2010-12-12 LAB — FERRITIN: Ferritin: 7 ng/mL — ABNORMAL LOW (ref 10–291)

## 2010-12-12 MED ORDER — IOHEXOL 300 MG/ML  SOLN
80.0000 mL | Freq: Once | INTRAMUSCULAR | Status: AC | PRN
  Administered 2010-12-12: 80 mL via INTRAVENOUS

## 2010-12-19 LAB — BASIC METABOLIC PANEL
BUN: 15 mg/dL (ref 6–23)
CO2: 22 mEq/L (ref 19–32)
Calcium: 10 mg/dL (ref 8.4–10.5)
Chloride: 106 mEq/L (ref 96–112)
Creatinine, Ser: 0.9 mg/dL (ref 0.50–1.10)
Glucose, Bld: 80 mg/dL (ref 70–99)
Potassium: 4.1 mEq/L (ref 3.5–5.3)
Sodium: 140 mEq/L (ref 135–145)

## 2010-12-19 LAB — HYPERCOAGULABLE PANEL, COMPREHENSIVE
AntiThromb III Func: 112 % (ref 76–126)
Anticardiolipin IgA: 3 APL U/mL (ref <22)
Anticardiolipin IgG: 7 GPL U/mL (ref <23)
Anticardiolipin IgM: 2 MPL U/mL (ref <11)
Beta-2 Glyco I IgG: 0 G Units (ref <20)
Beta-2-Glycoprotein I IgA: 0 A Units (ref <20)
Beta-2-Glycoprotein I IgM: 1 M Units (ref <20)
DRVVT: 34.9 secs (ref 34.1–42.2)
Lupus Anticoagulant: NOT DETECTED
PTT Lupus Anticoagulant: 32.3 secs (ref 30.0–45.6)
Protein C Activity: 166 % — ABNORMAL HIGH (ref 75–133)
Protein C, Total: 143 % (ref 72–160)
Protein S Activity: 70 % (ref 69–129)
Protein S Total: 128 % (ref 60–150)

## 2010-12-19 LAB — D-DIMER, QUANTITATIVE: D-Dimer, Quant: 0.71 ug/mL-FEU — ABNORMAL HIGH (ref 0.00–0.48)

## 2011-01-17 LAB — I-STAT 8, (EC8 V) (CONVERTED LAB)
Acid-base deficit: 2
BUN: 8
Bicarbonate: 21.8
Chloride: 109
Glucose, Bld: 88
HCT: 41
Hemoglobin: 13.9
Operator id: 294501
Potassium: 3.9
Sodium: 138
TCO2: 23
pCO2, Ven: 34 — ABNORMAL LOW
pH, Ven: 7.414 — ABNORMAL HIGH

## 2011-01-17 LAB — POCT CARDIAC MARKERS
CKMB, poc: 1.1
CKMB, poc: <1 — ABNORMAL LOW
CKMB, poc: <1 — ABNORMAL LOW
Myoglobin, poc: 57.5
Myoglobin, poc: 81.5
Myoglobin, poc: 87.2
Operator id: 288331
Operator id: 288331
Operator id: 294501
Troponin i, poc: <0.05
Troponin i, poc: <0.05
Troponin i, poc: <0.05

## 2011-01-17 LAB — CBC
HCT: 37.8
Hemoglobin: 12.7
MCHC: 33.7
MCV: 82.9
Platelets: 296
RBC: 4.56
RDW: 15.6 — ABNORMAL HIGH
WBC: 5.9

## 2011-01-17 LAB — URINALYSIS, ROUTINE W REFLEX MICROSCOPIC
Bilirubin Urine: NEGATIVE
Glucose, UA: NEGATIVE
Hgb urine dipstick: NEGATIVE
Ketones, ur: NEGATIVE
Nitrite: NEGATIVE
Protein, ur: NEGATIVE
Specific Gravity, Urine: 1.004 — ABNORMAL LOW
Urobilinogen, UA: 0.2
pH: 7

## 2011-01-17 LAB — DIFFERENTIAL
Basophils Absolute: 0
Basophils Relative: 0
Eosinophils Absolute: 0.1
Eosinophils Relative: 1
Lymphocytes Relative: 27
Lymphs Abs: 1.6
Monocytes Absolute: 0.4
Monocytes Relative: 8
Neutro Abs: 3.8
Neutrophils Relative %: 64

## 2011-01-17 LAB — POCT I-STAT CREATININE
Creatinine, Ser: 0.8
Operator id: 294501

## 2011-01-17 LAB — B-NATRIURETIC PEPTIDE (CONVERTED LAB): Pro B Natriuretic peptide (BNP): <30

## 2011-01-18 ENCOUNTER — Emergency Department (HOSPITAL_COMMUNITY): Payer: PRIVATE HEALTH INSURANCE

## 2011-01-18 ENCOUNTER — Encounter (HOSPITAL_COMMUNITY): Payer: Self-pay | Admitting: Radiology

## 2011-01-18 ENCOUNTER — Emergency Department (HOSPITAL_COMMUNITY)
Admission: EM | Admit: 2011-01-18 | Discharge: 2011-01-18 | Disposition: A | Discharge status: Home / Self Care | Payer: PRIVATE HEALTH INSURANCE | Attending: Emergency Medicine | Admitting: Emergency Medicine

## 2011-01-18 DIAGNOSIS — R079 Chest pain, unspecified: Secondary | ICD-10-CM | POA: Insufficient documentation

## 2011-01-18 DIAGNOSIS — R51 Headache: Secondary | ICD-10-CM | POA: Insufficient documentation

## 2011-01-18 DIAGNOSIS — Z79899 Other long term (current) drug therapy: Secondary | ICD-10-CM | POA: Insufficient documentation

## 2011-01-18 DIAGNOSIS — I10 Essential (primary) hypertension: Secondary | ICD-10-CM | POA: Insufficient documentation

## 2011-01-18 DIAGNOSIS — R1909 Other intra-abdominal and pelvic swelling, mass and lump: Secondary | ICD-10-CM | POA: Insufficient documentation

## 2011-01-18 HISTORY — DX: Acquired absence of both cervix and uterus: Z90.710

## 2011-01-18 HISTORY — DX: Essential (primary) hypertension: I10

## 2011-01-18 LAB — POCT I-STAT, CHEM 8
BUN: 12 mg/dL (ref 6–23)
Calcium, Ion: 1.19 mmol/L (ref 1.12–1.32)
Chloride: 106 mEq/L (ref 96–112)
Creatinine, Ser: 0.9 mg/dL (ref 0.50–1.10)
Glucose, Bld: 91 mg/dL (ref 70–99)
HCT: 40 % (ref 36.0–46.0)
Hemoglobin: 13.6 g/dL (ref 12.0–15.0)
Potassium: 3.4 mEq/L — ABNORMAL LOW (ref 3.5–5.1)
Sodium: 141 mEq/L (ref 135–145)
TCO2: 23 mmol/L (ref 0–100)

## 2011-01-18 LAB — URINALYSIS, ROUTINE W REFLEX MICROSCOPIC
Bilirubin Urine: NEGATIVE
Glucose, UA: NEGATIVE mg/dL
Hgb urine dipstick: NEGATIVE
Ketones, ur: NEGATIVE mg/dL
Leukocytes, UA: NEGATIVE
Nitrite: NEGATIVE
Protein, ur: NEGATIVE mg/dL
Specific Gravity, Urine: 1.019 (ref 1.005–1.030)
Urobilinogen, UA: 0.2 mg/dL (ref 0.0–1.0)
pH: 7 (ref 5.0–8.0)

## 2011-01-18 LAB — HEPATIC FUNCTION PANEL
ALT: 55 U/L — ABNORMAL HIGH (ref 0–35)
AST: 41 U/L — ABNORMAL HIGH (ref 0–37)
Albumin: 4.2 g/dL (ref 3.5–5.2)
Alkaline Phosphatase: 89 U/L (ref 39–117)
Bilirubin, Direct: 0.1 mg/dL (ref 0.0–0.3)
Indirect Bilirubin: 0.5 mg/dL (ref 0.3–0.9)
Total Bilirubin: 0.6 mg/dL (ref 0.3–1.2)
Total Protein: 7.3 g/dL (ref 6.0–8.3)

## 2011-01-18 LAB — DIFFERENTIAL
Basophils Absolute: 0 10*3/uL (ref 0.0–0.1)
Basophils Relative: 1 % (ref 0–1)
Eosinophils Absolute: 0 10*3/uL (ref 0.0–0.7)
Eosinophils Relative: 1 % (ref 0–5)
Lymphocytes Relative: 39 % (ref 12–46)
Lymphs Abs: 1.7 10*3/uL (ref 0.7–4.0)
Monocytes Absolute: 0.4 10*3/uL (ref 0.1–1.0)
Monocytes Relative: 9 % (ref 3–12)
Neutro Abs: 2.2 10*3/uL (ref 1.7–7.7)
Neutrophils Relative %: 50 % (ref 43–77)

## 2011-01-18 LAB — CBC
HCT: 36.3 % (ref 36.0–46.0)
Hemoglobin: 12.4 g/dL (ref 12.0–15.0)
MCH: 24.7 pg — ABNORMAL LOW (ref 26.0–34.0)
MCHC: 34.2 g/dL (ref 30.0–36.0)
MCV: 72.3 fL — ABNORMAL LOW (ref 78.0–100.0)
Platelets: 286 10*3/uL (ref 150–400)
RBC: 5.02 MIL/uL (ref 3.87–5.11)
RDW: 23.4 % — ABNORMAL HIGH (ref 11.5–15.5)
WBC: 4.3 10*3/uL (ref 4.0–10.5)

## 2011-01-18 LAB — POCT I-STAT TROPONIN I: Troponin i, poc: 0 ng/mL (ref 0.00–0.08)

## 2011-01-18 LAB — LIPASE, BLOOD: Lipase: 41 U/L (ref 11–59)

## 2011-01-18 MED ORDER — IOHEXOL 300 MG/ML  SOLN
80.0000 mL | Freq: Once | INTRAMUSCULAR | Status: AC | PRN
  Administered 2011-01-18: 80 mL via INTRAVENOUS

## 2011-03-01 ENCOUNTER — Other Ambulatory Visit: Payer: Self-pay | Admitting: Hematology and Oncology

## 2011-03-01 ENCOUNTER — Encounter (HOSPITAL_BASED_OUTPATIENT_CLINIC_OR_DEPARTMENT_OTHER): Payer: PRIVATE HEALTH INSURANCE | Admitting: Hematology and Oncology

## 2011-03-01 DIAGNOSIS — R1902 Left upper quadrant abdominal swelling, mass and lump: Secondary | ICD-10-CM

## 2011-03-01 DIAGNOSIS — D539 Nutritional anemia, unspecified: Secondary | ICD-10-CM

## 2011-03-01 DIAGNOSIS — I82509 Chronic embolism and thrombosis of unspecified deep veins of unspecified lower extremity: Secondary | ICD-10-CM

## 2011-03-01 LAB — BASIC METABOLIC PANEL
BUN: 13 mg/dL (ref 6–23)
CO2: 25 mEq/L (ref 19–32)
Calcium: 9.6 mg/dL (ref 8.4–10.5)
Chloride: 105 mEq/L (ref 96–112)
Creatinine, Ser: 0.81 mg/dL (ref 0.50–1.10)
Glucose, Bld: 113 mg/dL — ABNORMAL HIGH (ref 70–99)
Potassium: 4 mEq/L (ref 3.5–5.3)
Sodium: 139 mEq/L (ref 135–145)

## 2011-03-01 LAB — FERRITIN: Ferritin: 21 ng/mL (ref 10–291)

## 2011-03-01 LAB — IRON AND TIBC
%SAT: 35 % (ref 20–55)
Iron: 106 ug/dL (ref 42–145)
TIBC: 302 ug/dL (ref 250–470)
UIBC: 196 ug/dL (ref 125–400)

## 2011-03-01 LAB — CBC WITH DIFFERENTIAL/PLATELET
BASO%: 0.2 % (ref 0.0–2.0)
Basophils Absolute: 0 10*3/uL (ref 0.0–0.1)
EOS%: 2.3 % (ref 0.0–7.0)
Eosinophils Absolute: 0.1 10*3/uL (ref 0.0–0.5)
HCT: 39.5 % (ref 34.8–46.6)
HGB: 13.4 g/dL (ref 11.6–15.9)
LYMPH%: 27.3 % (ref 14.0–49.7)
MCH: 27.8 pg (ref 25.1–34.0)
MCHC: 33.8 g/dL (ref 31.5–36.0)
MCV: 82.1 fL (ref 79.5–101.0)
MONO#: 0.3 10*3/uL (ref 0.1–0.9)
MONO%: 7.4 % (ref 0.0–14.0)
NEUT#: 2.8 10*3/uL (ref 1.5–6.5)
NEUT%: 62.8 % (ref 38.4–76.8)
Platelets: 268 10*3/uL (ref 145–400)
RBC: 4.82 10*6/uL (ref 3.70–5.45)
RDW: 22.3 % — ABNORMAL HIGH (ref 11.2–14.5)
WBC: 4.4 10*3/uL (ref 3.9–10.3)
lymph#: 1.2 10*3/uL (ref 0.9–3.3)

## 2011-03-02 ENCOUNTER — Encounter (HOSPITAL_BASED_OUTPATIENT_CLINIC_OR_DEPARTMENT_OTHER): Payer: PRIVATE HEALTH INSURANCE | Admitting: Oncology

## 2011-03-02 DIAGNOSIS — R1902 Left upper quadrant abdominal swelling, mass and lump: Secondary | ICD-10-CM

## 2011-03-02 DIAGNOSIS — I82509 Chronic embolism and thrombosis of unspecified deep veins of unspecified lower extremity: Secondary | ICD-10-CM

## 2011-03-02 DIAGNOSIS — R509 Fever, unspecified: Secondary | ICD-10-CM

## 2011-03-02 DIAGNOSIS — D539 Nutritional anemia, unspecified: Secondary | ICD-10-CM

## 2011-03-05 NOTE — Progress Notes (Signed)
CC:   Maxie Better, M.D. Margaretmary Bayley, M.D. Maretta Bees. Vonita Moss, M.D. James L. Malon Kindle., M.D.  IDENTIFYING STATEMENT:  Sheryl [name] is a 45 year old black female with iron-deficiency anemia, as well as history of left upper extremity thrombosis who presents for followup.  INTERIM HISTORY:  Sheryl [name] reports since her last clinic visit in August 2012 that she overall has had normal energy level, although for the last couple of days, she has had some intermittent fatigue which she attributes to problems with generalized malaise, as well as intermittent productive cough, which she states is at times a greenish color, but otherwise is clear.  She is not having any chest discomfort or dyspnea. She states that she does have occasional flares of bronchitis and she follows up with her primary physician, Dr. Margaretmary Bayley and receives an antibiotic injection, but she has not followed up to date.  She is not having issues with anorexia.  No nausea, vomiting, constipation, or diarrhea.  No dysuria, no frequency or hematuria.  No alteration in sensation or balance or swelling of extremities.  No issues with bruising or bleeding.  She also has remained on oral iron 1 tab p.o. daily since her last clinic visit and is having no GI intolerance with this.  Other medications are reviewed and recorded.  PHYSICAL EXAMINATION:  Vital Signs:  Temperature is 99.4, heart rate 78, respirations 20, blood pressure 129/81, weight 188.8 pounds.  General: This is a well-developed, well-nourished black female in no acute distress.  HEENT:  Sclerae are nonicteric.  There is no oral thrush or mucositis.  Skin:  Without rashes or lesions.  Lymph:  No peripheral lymphadenopathy.  Cardiac:  Regular rate and rhythm without murmurs or gallops.  Peripheral pulses are 2+.  Chest:  Lungs are clear to auscultation.  Abdomen:  Positive bowel sounds.  Soft, nontender, nondistended.  No organomegaly.  Extremities:   Without edema, cyanosis, or calf tenderness.  Neuro:  Alert and oriented x3.  Strength, sensation, and coordination all grossly intact.  LABORATORIES:  Laboratory data from March 01, 2011:  CBC with diff reveals white blood count of 4.4, hemoglobin 13.4, hematocrit 39.5, platelets of 268, ANC of 2.8, and MCV of 82.1.  Chemistries reveal a sodium of 139, potassium 4.0, chloride of 105, BUN 13, creatinine 0.81, glucose of 113, and calcium of 9.6.  Iron studies reveal an iron level of 106, ferritin of 21, UIBC of 196, TIBC of 302, and percent saturation of 35.  IMPRESSIONS/PLAN: 1. Sheryl [name] is a 45 year old black female with iron-deficiency     anemia.  She has normal hemoglobin and hematocrit, as well as iron     studies since she has been on oral iron over the past couple of     months.  Per Dr. Dalene Carrow, the patient will continue oral iron and we     will plan to reassess the patient in 6 months' time.  A few days     before this, we will check CBC with diff, BMET, ferritin, iron IBC.     She is advised to call in the interim if any questions or problems. 2. 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  She received a short course of Lovenox and then this was     discontinued due to issues with bleeding.  She has been off  anticoagulation since June 2012.  She did last have an angio CT of     the chest on December 12, 2010, due to experiencing some intermittent     chest discomfort.  This was negative for pulmonary embolism or     other active disease. 3. The patient reports issues with intermittent productive cough, as     well as low-grade temperature.  She is     advised to follow up with her primary physician, Dr. Margaretmary Bayley,     for further evaluation and management and she verbalized     understanding.    ______________________________ Sherilyn Banker, MSN,  ANP, BC RJ/MEDQ  D:  03/02/2011  T:  03/05/2011  Job:  [id number]

## 2011-03-15 ENCOUNTER — Other Ambulatory Visit: Payer: Self-pay | Admitting: Hematology and Oncology

## 2011-05-28 ENCOUNTER — Other Ambulatory Visit (HOSPITAL_COMMUNITY): Payer: Self-pay | Admitting: Obstetrics and Gynecology

## 2011-05-28 DIAGNOSIS — Z1231 Encounter for screening mammogram for malignant neoplasm of breast: Secondary | ICD-10-CM

## 2011-07-02 ENCOUNTER — Other Ambulatory Visit: Payer: Self-pay | Admitting: Hematology and Oncology

## 2011-07-05 ENCOUNTER — Ambulatory Visit (HOSPITAL_COMMUNITY)
Admission: RE | Admit: 2011-07-05 | Discharge: 2011-07-05 | Disposition: A | Discharge status: Home / Self Care | Payer: PRIVATE HEALTH INSURANCE | Source: Ambulatory Visit | Attending: Obstetrics and Gynecology | Admitting: Obstetrics and Gynecology

## 2011-07-05 DIAGNOSIS — Z1231 Encounter for screening mammogram for malignant neoplasm of breast: Secondary | ICD-10-CM

## 2011-08-28 ENCOUNTER — Other Ambulatory Visit: Payer: Self-pay | Admitting: Nurse Practitioner

## 2011-08-28 MED ORDER — FERROUS SULFATE 325 (65 FE) MG PO TABS: 325.0000 mg | ORAL_TABLET | Freq: Two times a day (BID) | ORAL | Status: DC

## 2011-08-29 ENCOUNTER — Telehealth: Payer: Self-pay | Admitting: *Deleted

## 2011-08-29 ENCOUNTER — Other Ambulatory Visit: Payer: PRIVATE HEALTH INSURANCE | Admitting: Lab

## 2011-08-29 NOTE — Telephone Encounter (Signed)
Pt called and left message re:  Would like to cancel lab appt for today and f/u appt with md on Fri 08/31/11  Due to insurance issues.   Pt stated she would call back for rescheduling appt once insurance issues resolved. Attempted to call pt to offer another f/u appt with NP on 5/15.   Left message on voice mail for pt to call nurse back. Pt's  Phone    [phone].

## 2011-08-30 ENCOUNTER — Telehealth: Payer: Self-pay | Admitting: *Deleted

## 2011-08-30 NOTE — Telephone Encounter (Signed)
Attempted to call pt at home again unsuccessfully.   Left message on voice mail for pt to return nurse's call.

## 2011-08-31 ENCOUNTER — Ambulatory Visit: Payer: PRIVATE HEALTH INSURANCE | Admitting: Hematology and Oncology

## 2011-08-31 ENCOUNTER — Telehealth: Payer: Self-pay | Admitting: *Deleted

## 2011-08-31 ENCOUNTER — Encounter: Payer: Self-pay | Admitting: *Deleted

## 2011-08-31 NOTE — Telephone Encounter (Signed)
Called pt at home and spoke with mother.   Asked mother to have pt call nurse back for further instructions from md.   Mother stated she would relay message to pt.

## 2012-04-27 ENCOUNTER — Encounter (HOSPITAL_COMMUNITY): Payer: Self-pay | Admitting: Emergency Medicine

## 2012-04-27 ENCOUNTER — Emergency Department (HOSPITAL_COMMUNITY)
Admission: EM | Admit: 2012-04-27 | Discharge: 2012-04-27 | Disposition: A | Discharge status: Home / Self Care | Payer: 59 | Source: Home / Self Care

## 2012-04-27 DIAGNOSIS — J40 Bronchitis, not specified as acute or chronic: Secondary | ICD-10-CM

## 2012-04-27 MED ORDER — PENICILLIN V POTASSIUM 500 MG PO TABS: 500.0000 mg | ORAL_TABLET | Freq: Four times a day (QID) | ORAL | Status: DC

## 2012-04-27 MED ORDER — LEVOFLOXACIN 500 MG PO TABS: 500.0000 mg | ORAL_TABLET | Freq: Every day | ORAL | Status: DC

## 2012-04-27 MED ORDER — HYDROCOD POLST-CHLORPHEN POLST 10-8 MG/5ML PO LQCR: 5.0000 mL | Freq: Two times a day (BID) | ORAL | Status: DC

## 2012-04-27 NOTE — ED Notes (Signed)
Waiting discharge papers 

## 2012-04-27 NOTE — ED Provider Notes (Signed)
History     CSN: [id number]  Arrival date & time 04/27/12  1121   None     Chief Complaint  Patient presents with  . URI    productive cough with clear sputum. low grade temp. sinus congestion.     (Consider location/radiation/quality/duration/timing/severity/associated sxs/prior treatment) Patient is a 46 y.o. female presenting with URI. The history is provided by the patient. No language interpreter was used.  URI The primary symptoms include cough. The current episode started 2 days ago. This is a new problem.  Associated with: nothing. The illness is not associated with sinus pressure.   Pt complains of a cough and congestion  Past Medical History  Diagnosis Date  . History of hysterectomy   . Hypertension     Past Surgical History  Procedure Date  . Abdominal hysterectomy     History reviewed. No pertinent family history.  History  Substance Use Topics  . Smoking status: Never Smoker   . Smokeless tobacco: Not on file  . Alcohol Use: No    OB History    Grav Para Term Preterm Abortions TAB SAB Ect Mult Living                  Review of Systems  HENT: Negative for sinus pressure.   Respiratory: Positive for cough.   All other systems reviewed and are negative.    Allergies  Acetaminophen; Asparaginase derivatives; Hydrocodone; Mortrinsic; Naproxen sodium; and Zithromax  Home Medications   Current Outpatient Rx  Name  Route  Sig  Dispense  Refill  . BIOTENE DRY MOUTH MT LIQD   Mouth Rinse   15 mLs by Mouth Rinse route as needed.         Marland Kitchen DILTIAZEM HCL 120 MG PO TABS   Oral   Take 120 mg by mouth 4 (four) times daily.         Marland Kitchen ONE-DAILY MULTI VITAMINS PO TABS   Oral   Take 1 tablet by mouth daily.         Marland Kitchen VALSARTAN-HYDROCHLOROTHIAZIDE 160-25 MG PO TABS   Oral   Take 1 tablet by mouth daily.         Marland Kitchen FERROUS SULFATE 325 (65 FE) MG PO TABS   Oral   Take 1 tablet (325 mg total) by mouth 2 (two) times daily.   60 tablet    2     BP 135/96  Pulse 90  Temp 100.3 F (37.9 C) (Oral)  Resp 20  SpO2 100%  LMP 06/29/2010  Physical Exam  Nursing note and vitals reviewed. Constitutional: She is oriented to person, place, and time. She appears well-developed and well-nourished.  HENT:  Head: Normocephalic.  Right Ear: External ear normal.  Left Ear: External ear normal.  Nose: Nose normal.  Mouth/Throat: Oropharynx is clear and moist.  Eyes: Conjunctivae normal and EOM are normal. Pupils are equal, round, and reactive to light.  Neck: Normal range of motion. Neck supple.  Cardiovascular: Normal rate, regular rhythm and normal heart sounds.   Pulmonary/Chest: Effort normal and breath sounds normal.  Abdominal: Soft. Bowel sounds are normal.  Musculoskeletal: Normal range of motion.  Neurological: She is alert and oriented to person, place, and time.  Skin: Skin is warm.  Psychiatric: She has a normal mood and affect.    ED Course  Procedures (including critical care time)  Labs Reviewed - No data to display No results found.   No diagnosis found.    MDM  pcn vk 500mg  [address] [place], [place] 04/27/12 1343

## 2012-04-27 NOTE — ED Provider Notes (Signed)
Medical screening examination/treatment/procedure(s) were performed by non-physician practitioner and as supervising physician I was immediately available for consultation/collaboration.  Ellah Otte, M.D.   Roger Kettles C Kenita Bines, MD 04/27/12 2038 

## 2012-04-27 NOTE — ED Notes (Signed)
Pt c/o productive cough with clear sputum and sinus congestion since 04/25/12. Low grade temp off/on. Pt denies sob, n/v/d. Pt has tried tylenol and decongestants with no relief in symptoms.

## 2012-05-30 ENCOUNTER — Other Ambulatory Visit (HOSPITAL_COMMUNITY): Payer: Self-pay | Admitting: Obstetrics and Gynecology

## 2012-05-30 DIAGNOSIS — Z1231 Encounter for screening mammogram for malignant neoplasm of breast: Secondary | ICD-10-CM

## 2012-07-15 ENCOUNTER — Ambulatory Visit (HOSPITAL_COMMUNITY): Payer: 59

## 2012-07-22 ENCOUNTER — Ambulatory Visit (HOSPITAL_COMMUNITY)
Admission: RE | Admit: 2012-07-22 | Discharge: 2012-07-22 | Disposition: A | Discharge status: Home / Self Care | Payer: 59 | Source: Ambulatory Visit | Attending: Obstetrics and Gynecology | Admitting: Obstetrics and Gynecology

## 2012-07-22 DIAGNOSIS — Z1231 Encounter for screening mammogram for malignant neoplasm of breast: Secondary | ICD-10-CM | POA: Insufficient documentation

## 2012-10-29 ENCOUNTER — Encounter (HOSPITAL_COMMUNITY): Payer: Self-pay | Admitting: Emergency Medicine

## 2012-10-29 DIAGNOSIS — Z90710 Acquired absence of both cervix and uterus: Secondary | ICD-10-CM | POA: Insufficient documentation

## 2012-10-29 DIAGNOSIS — R1013 Epigastric pain: Secondary | ICD-10-CM | POA: Insufficient documentation

## 2012-10-29 DIAGNOSIS — Z79899 Other long term (current) drug therapy: Secondary | ICD-10-CM | POA: Insufficient documentation

## 2012-10-29 DIAGNOSIS — I10 Essential (primary) hypertension: Secondary | ICD-10-CM | POA: Insufficient documentation

## 2012-10-29 DIAGNOSIS — R110 Nausea: Secondary | ICD-10-CM | POA: Insufficient documentation

## 2012-10-29 LAB — COMPREHENSIVE METABOLIC PANEL
ALT: 53 U/L — ABNORMAL HIGH (ref 0–35)
AST: 45 U/L — ABNORMAL HIGH (ref 0–37)
Albumin: 4.1 g/dL (ref 3.5–5.2)
Alkaline Phosphatase: 132 U/L — ABNORMAL HIGH (ref 39–117)
BUN: 18 mg/dL (ref 6–23)
CO2: 28 mEq/L (ref 19–32)
Calcium: 9.8 mg/dL (ref 8.4–10.5)
Chloride: 102 mEq/L (ref 96–112)
Creatinine, Ser: 0.74 mg/dL (ref 0.50–1.10)
GFR calc Af Amer: >90 mL/min (ref >90)
GFR calc non Af Amer: >90 mL/min (ref >90)
Glucose, Bld: 113 mg/dL — ABNORMAL HIGH (ref 70–99)
Potassium: 4 mEq/L (ref 3.5–5.1)
Sodium: 139 mEq/L (ref 135–145)
Total Bilirubin: 0.2 mg/dL — ABNORMAL LOW (ref 0.3–1.2)
Total Protein: 7.9 g/dL (ref 6.0–8.3)

## 2012-10-29 LAB — POCT I-STAT TROPONIN I: Troponin i, poc: 0 ng/mL (ref 0.00–0.08)

## 2012-10-29 LAB — CBC WITH DIFFERENTIAL/PLATELET
Basophils Absolute: 0 10*3/uL (ref 0.0–0.1)
Basophils Relative: 0 % (ref 0–1)
Eosinophils Absolute: 0.1 10*3/uL (ref 0.0–0.7)
Eosinophils Relative: 2 % (ref 0–5)
HCT: 41.3 % (ref 36.0–46.0)
Hemoglobin: 14.9 g/dL (ref 12.0–15.0)
Lymphocytes Relative: 28 % (ref 12–46)
Lymphs Abs: 1.4 10*3/uL (ref 0.7–4.0)
MCH: 29.9 pg (ref 26.0–34.0)
MCHC: 36.1 g/dL — ABNORMAL HIGH (ref 30.0–36.0)
MCV: 82.8 fL (ref 78.0–100.0)
Monocytes Absolute: 0.5 10*3/uL (ref 0.1–1.0)
Monocytes Relative: 9 % (ref 3–12)
Neutro Abs: 3.2 10*3/uL (ref 1.7–7.7)
Neutrophils Relative %: 62 % (ref 43–77)
Platelets: 318 10*3/uL (ref 150–400)
RBC: 4.99 MIL/uL (ref 3.87–5.11)
RDW: 13.3 % (ref 11.5–15.5)
WBC: 5.2 10*3/uL (ref 4.0–10.5)

## 2012-10-29 LAB — URINE MICROSCOPIC-ADD ON

## 2012-10-29 LAB — URINALYSIS, ROUTINE W REFLEX MICROSCOPIC
Bilirubin Urine: NEGATIVE
Glucose, UA: NEGATIVE mg/dL
Hgb urine dipstick: NEGATIVE
Ketones, ur: NEGATIVE mg/dL
Nitrite: NEGATIVE
Protein, ur: NEGATIVE mg/dL
Specific Gravity, Urine: 1.023 (ref 1.005–1.030)
Urobilinogen, UA: 0.2 mg/dL (ref 0.0–1.0)
pH: 6 (ref 5.0–8.0)

## 2012-10-29 LAB — LIPASE, BLOOD: Lipase: 46 U/L (ref 11–59)

## 2012-10-29 NOTE — ED Notes (Addendum)
PT. REPORTS INTERMITTENT MID ABDOMINAL PAIN AND MID BACK PAIN WITH NAUSEA AND DIARRHEA ONSET Monday , DENIES FEVER OR CHILLS. NO URINARY DISCOMFORT OR HEMATURIA.

## 2012-10-30 ENCOUNTER — Emergency Department (HOSPITAL_COMMUNITY): Payer: 59

## 2012-10-30 ENCOUNTER — Emergency Department (HOSPITAL_COMMUNITY)
Admission: EM | Admit: 2012-10-30 | Discharge: 2012-10-30 | Disposition: A | Discharge status: Home / Self Care | Payer: 59 | Attending: Emergency Medicine | Admitting: Emergency Medicine

## 2012-10-30 DIAGNOSIS — R109 Unspecified abdominal pain: Secondary | ICD-10-CM

## 2012-10-30 MED ORDER — FENTANYL CITRATE 0.05 MG/ML IJ SOLN
50.0000 ug | Freq: Once | INTRAMUSCULAR | Status: AC
  Administered 2012-10-30: 50 ug via INTRAVENOUS
  Filled 2012-10-30: qty 2

## 2012-10-30 MED ORDER — ONDANSETRON HCL 4 MG/2ML IJ SOLN
4.0000 mg | Freq: Once | INTRAMUSCULAR | Status: AC
  Administered 2012-10-30: 4 mg via INTRAVENOUS
  Filled 2012-10-30: qty 2

## 2012-10-30 MED ORDER — DICYCLOMINE HCL 20 MG PO TABS: 20.0000 mg | ORAL_TABLET | Freq: Four times a day (QID) | ORAL | Status: DC | PRN

## 2012-10-30 NOTE — ED Notes (Signed)
Pt to ultrasound at this time.

## 2012-10-30 NOTE — ED Provider Notes (Addendum)
History    CSN: [id number] Arrival date & time 10/29/12  2215  First MD Initiated Contact with Patient 10/30/12 0006     Chief Complaint  Patient presents with  . Abdominal Pain   (Consider location/radiation/quality/duration/timing/severity/associated sxs/prior Treatment) HPI 47 yo female presents to the ER from home with complaint of abdominal pain, nausea.  Sxs started on Monday evening while driving and eating a hamburger.  She reports sharp epigastric pain, bloating with radiation into her chest.  She took a leftover percocet around 1 am that helped symptoms.  She did not eat much on Tuesday.  Pain returned again today, again in upper abdomen with radiation into her back.  No upper abdominal pain currently, dull pain in lower abd now.  Pt reports she had some tingling in her left arm earlier, none now.  Pt s/p hysterectomy 2012, tummy tuck/liposuction/hernia repair in Jan 14.  No vomiting, or fevers.  Some loose stools and occasional sweating.  Past Medical History  Diagnosis Date  . History of hysterectomy   . Hypertension    Past Surgical History  Procedure Laterality Date  . Abdominal hysterectomy     No family history on file. History  Substance Use Topics  . Smoking status: Never Smoker   . Smokeless tobacco: Not on file  . Alcohol Use: No   OB History   Grav Para Term Preterm Abortions TAB SAB Ect Mult Living                 Review of Systems  All other systems reviewed and are negative.  other than listed in hpi  Allergies  Aspirin; Cyclobenzaprine; Hydrocodone; Ibuprofen; Naproxen sodium; Zithromax; and Eggs or egg-derived products  Home Medications   Current Outpatient Rx  Name  Route  Sig  Dispense  Refill  . acetaminophen (TYLENOL) 500 MG tablet   Oral   Take 500 mg by mouth daily as needed for pain. For pain         . Biotin 5000 MCG TABS   Oral   Take 10,000 mcg by mouth every morning.         . Calcium-Phosphorus-Vitamin D (CALCIUM  GUMMIES PO)   Oral   Take 2 tablets by mouth every morning.         . cholecalciferol (VITAMIN D) 1000 UNITS tablet   Oral   Take 1,000 Units by mouth daily.         Marland Kitchen diltiazem (CARDIZEM) 120 MG tablet   Oral   Take 120 mg by mouth 2 (two) times daily.          . Flaxseed, Linseed, (FLAXSEED OIL PO)   Oral   Take 2 tablets by mouth 2 (two) times daily.         . Inulin (FIBER CHOICE PO)   Oral   Take 2 tablets by mouth at bedtime. Fiber Choice Gummies         . Multiple Vitamins-Minerals (HAIR/SKIN/NAILS PO)   Oral   Take 2 tablets by mouth at bedtime.         Ailene Ards Fatty Acids-Vitamins (OMEGA-3 GUMMIES PO)   Oral   Take 2 tablets by mouth every morning.         Marland Kitchen omeprazole (PRILOSEC) 20 MG capsule   Oral   Take 20 mg by mouth at bedtime.         Marland Kitchen oxyCODONE-acetaminophen (PERCOCET/ROXICET) 5-325 MG per tablet   Oral   Take 1 tablet by  mouth every 6 (six) hours as needed for pain. For pain         . Prenatal Vit-Fe Fumarate-FA (PRENATAL MULTIVITAMIN) TABS   Oral   Take 2 tablets by mouth at bedtime.         . valsartan-hydrochlorothiazide (DIOVAN-HCT) 160-12.5 MG per tablet   Oral   Take 1 tablet by mouth every morning.         . Vitamins C E (VITAMIN C & E COMPLEX) 500-400 MG-UNIT CAPS   Oral   Take 1 tablet by mouth at bedtime.          BP 150/114  Pulse 81  Temp(Src) 98.4 F (36.9 C) (Oral)  Resp 16  SpO2 98%  LMP 06/29/2010 Physical Exam  Nursing note and vitals reviewed. Constitutional: She is oriented to person, place, and time. She appears well-developed and well-nourished.  HENT:  Head: Normocephalic and atraumatic.  Nose: Nose normal.  Mouth/Throat: Oropharynx is clear and moist.  Eyes: Conjunctivae and EOM are normal. Pupils are equal, round, and reactive to light.  Neck: Normal range of motion. Neck supple. No JVD present. No tracheal deviation present. No thyromegaly present.  Cardiovascular: Normal rate, regular  rhythm, normal heart sounds and intact distal pulses.  Exam reveals no gallop and no friction rub.   No murmur heard. Pulmonary/Chest: Effort normal and breath sounds normal. No stridor. No respiratory distress. She has no wheezes. She has no rales. She exhibits no tenderness.  Abdominal: Soft. Bowel sounds are normal. She exhibits no distension and no mass. There is tenderness (mild lower abd pain). There is no rebound and no guarding.  Musculoskeletal: Normal range of motion. She exhibits no edema and no tenderness.  Lymphadenopathy:    She has no cervical adenopathy.  Neurological: She is alert and oriented to person, place, and time. She exhibits normal muscle tone. Coordination normal.  Skin: Skin is warm and dry. No rash noted. No erythema. No pallor.  Psychiatric: She has a normal mood and affect. Her behavior is normal. Judgment and thought content normal.    ED Course  Procedures (including critical care time) Labs Reviewed  CBC WITH DIFFERENTIAL - Abnormal; Notable for the following:    MCHC 36.1 (*)    All other components within normal limits  COMPREHENSIVE METABOLIC PANEL - Abnormal; Notable for the following:    Glucose, Bld 113 (*)    AST 45 (*)    ALT 53 (*)    Alkaline Phosphatase 132 (*)    Total Bilirubin 0.2 (*)    All other components within normal limits  URINALYSIS, ROUTINE W REFLEX MICROSCOPIC - Abnormal; Notable for the following:    Leukocytes, UA TRACE (*)    All other components within normal limits  LIPASE, BLOOD  URINE MICROSCOPIC-ADD ON  POCT I-STAT TROPONIN I   US Abdomen Complete  10/30/2012   *RADIOLOGY REPORT*  Clinical Data:  Upper abdominal pain.  Elevated liver function tests.  COMPLETE ABDOMINAL ULTRASOUND  Comparison:  None.  Findings:  Gallbladder:  No gallstones, gallbladder wall thickening, or pericholecystic fluid.  Common bile duct:  4 mm, normal.  Liver:  No focal mass lesions.  Echotexture within normal limits.  IVC:  Appears normal.   Pancreas:  No focal abnormality seen.  Spleen:  7.4 cm.  Normal echotexture.  Right Kidney:  11 cm. Normal echotexture.  Normal central sinus echo complex.  No calculi or hydronephrosis.  Left Kidney:  12 cm.  Septated cystic lesion is present  measuring 58 mm x 39 mm x 55 mm.  Allowing for technical differences, this is probably unchanged compared to prior CT of 2012.  The septations are thin and there is no internal vascular flow to suggest neoplasm.  Abdominal aorta:  Normal.  Mass is present in the splenic hilum measuring 40 mm x 46 mm x 37 mm, similar to the prior CT.  Lack of interval change is reassuring and this probably represents either a splenic remnant or sequela of prior lymphoma.  IMPRESSION:  1.  No acute abnormality.  Negative for cholelithiasis or cholecystitis. 2.  Minimally complex left renal cyst measuring 58 mm long axis. Based on stability, this is benign. 3.  46 mm long axis nonspecific mass in the splenic hilum is unchanged compared to prior consistent with a benign or indolent etiology.   Original Report Authenticated By: Andreas Newport, M.D.    Date: 10/30/2012  Rate: 82  Rhythm: normal sinus rhythm  QRS Axis: normal  Intervals: normal  ST/T Wave abnormalities: nonspecific T wave changes  Conduction Disutrbances:none  Narrative Interpretation:   Old EKG Reviewed: unchanged    1. Abdominal pain     MDM  47 yo female with abd pain, slightly elevated lfts.  Differential includes gallbladder disease given upper abd pain while eating and some radiation into her back.  Do not feel sxs are due to acs, pe, aortic pathology.  Does not seem c/w diverticulitis, appendicitis.  Will get u/s abd, treat symptoms.  Olivia Mackie, MD 10/30/12 [id number]  Olivia Mackie, MD 10/30/12 [phone]

## 2012-10-30 NOTE — ED Notes (Signed)
Pt returned from ultrasound.  St's pain has subsided at this time.  Pt alert and oriented x's 3.

## 2012-10-30 NOTE — ED Notes (Signed)
Pt sts understanding to dc.  Pt ambulatory to exit without difficulty. Pt denies need for w/c.

## 2013-05-30 IMAGING — CT CT ANGIO CHEST
2 of 6 series · 19 of 36 positions shown · IV contrast (agent unspecified)
Comparison: Chest CT on 09/14/2010

CLINICAL DATA: Acute chest pain and shortness of breath.  Previous
pulmonary embolism and lung carcinoma.

CT ANGIOGRAPHY CHEST WITH CONTRAST
TECHNIQUE: Multidetector CT imaging of the chest was performed
using the standard protocol during bolus administration of
intravenous contrast.  Multiplanar CT image reconstructions
including MIPs were obtained to evaluate the vascular anatomy.
Contrast:  80 ml [id number]

[Series 6: pe thins @ 1mm · axial · 0.67mm/px · z∈[-96,+158]mm · 18 of 284 slices shown]
[im 15/284  lung]
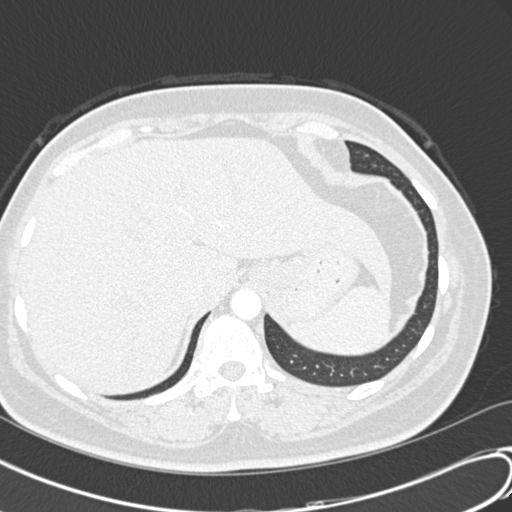
[im 29/284  mediastinal]
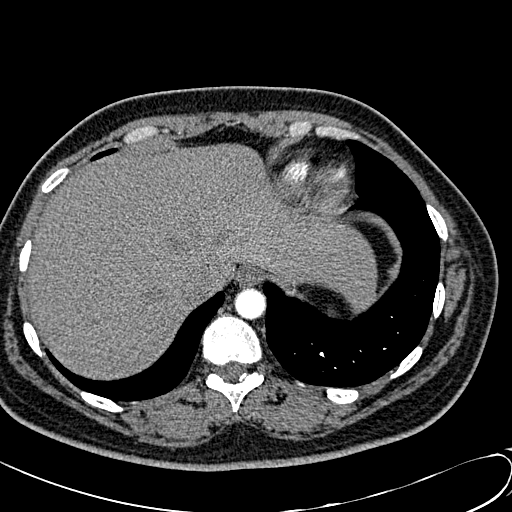
[im 43/284  lung]
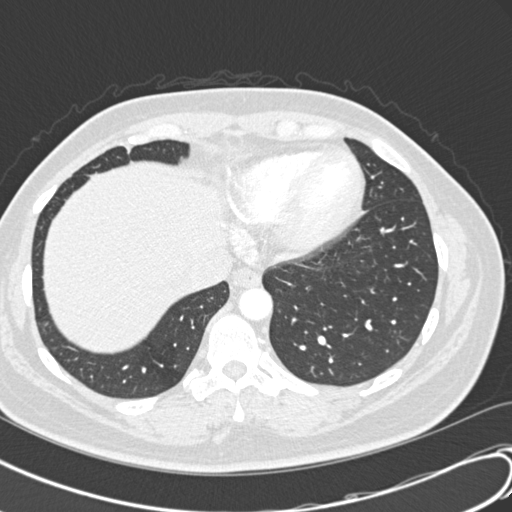
[im 57/284  mediastinal]
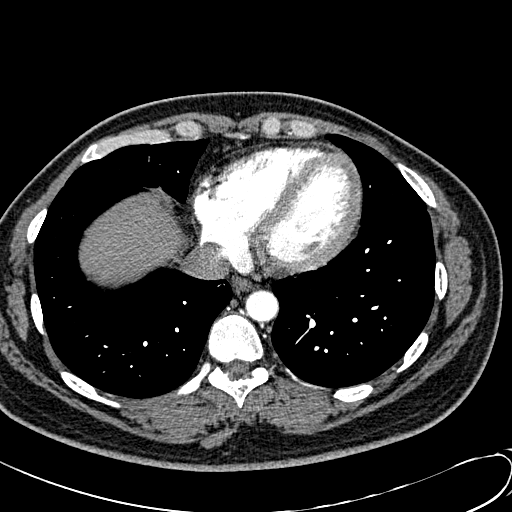
[im 71/284  lung]
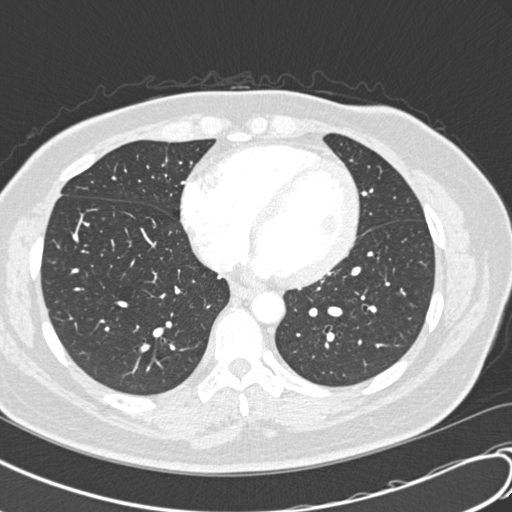
[im 85/284  mediastinal]
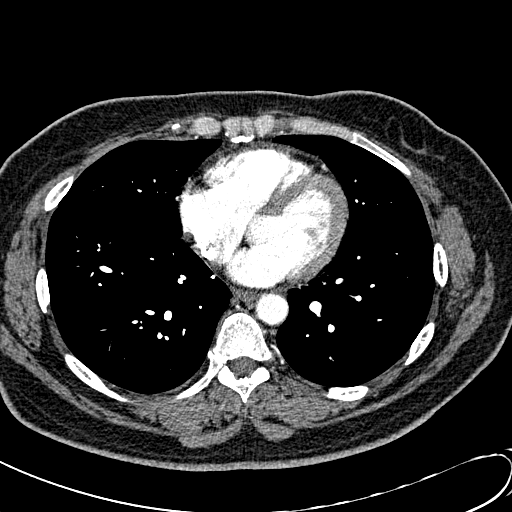
[im 100/284  lung]
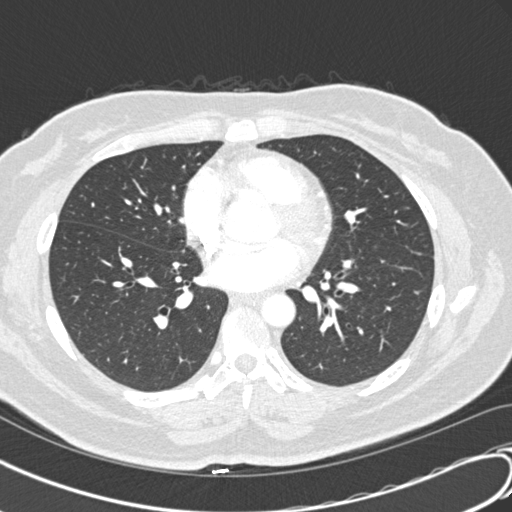
[im 114/284  mediastinal]
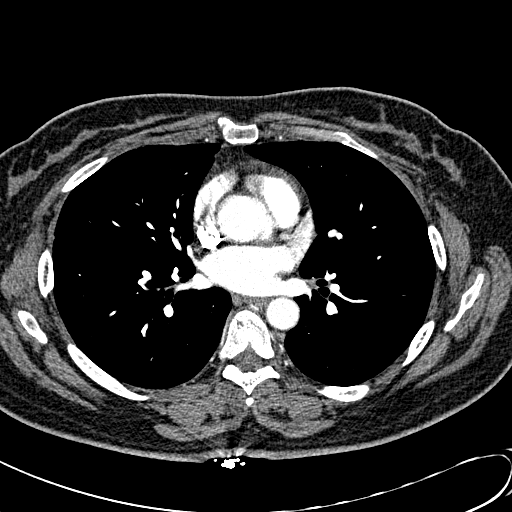
[im 128/284  lung]
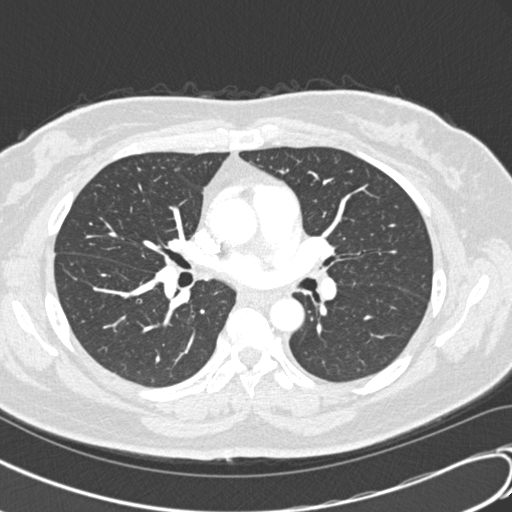
[im 156/284  mediastinal]
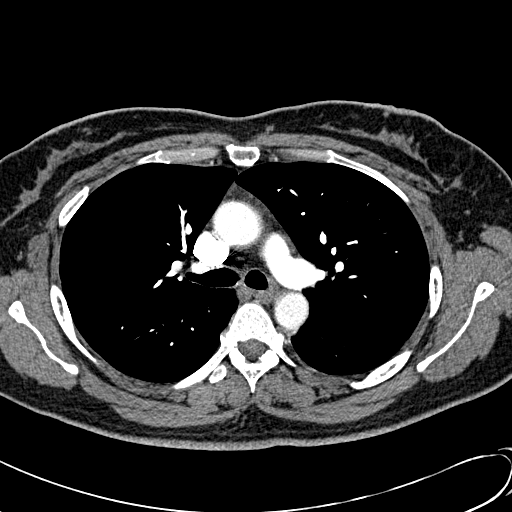
[im 170/284  lung]
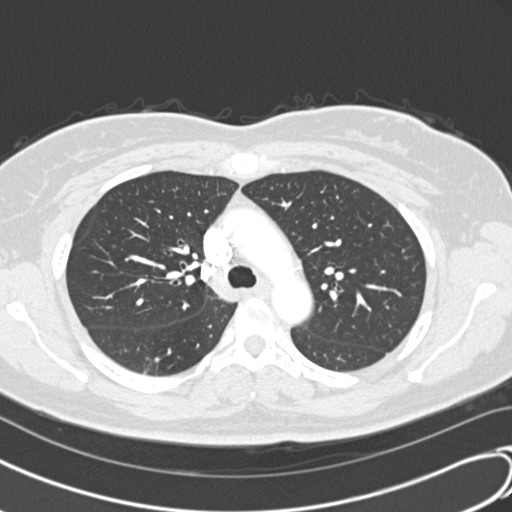
[im 184/284  mediastinal]
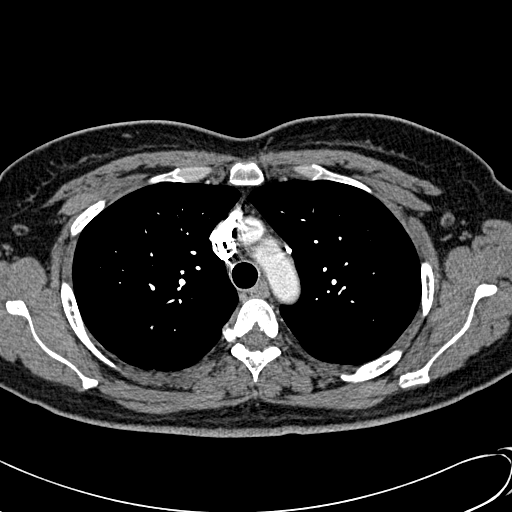
[im 199/284  lung]
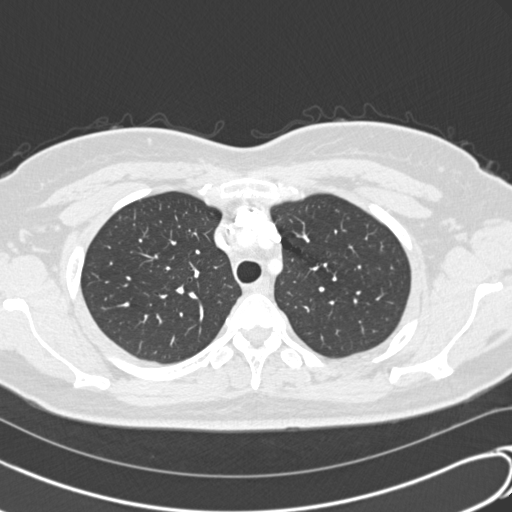
[im 213/284  mediastinal]
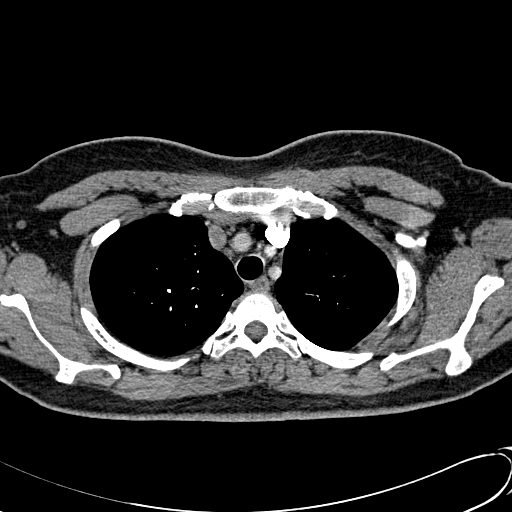
[im 227/284  lung]
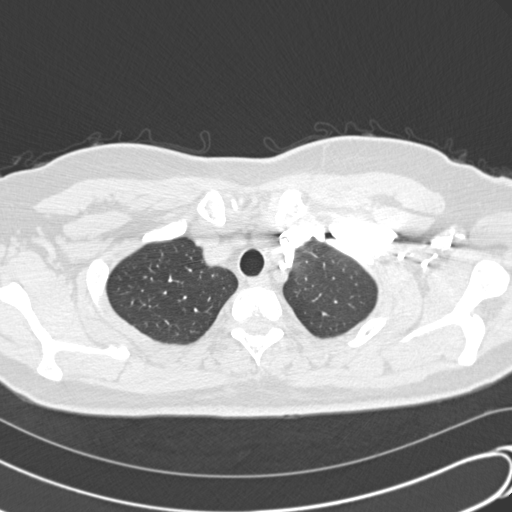
[im 241/284  mediastinal]
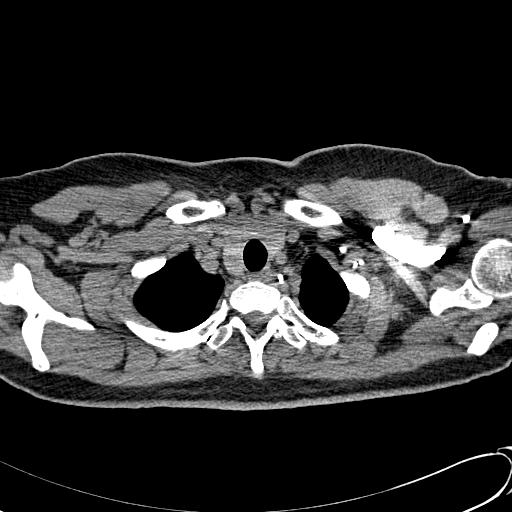
[im 255/284  lung]
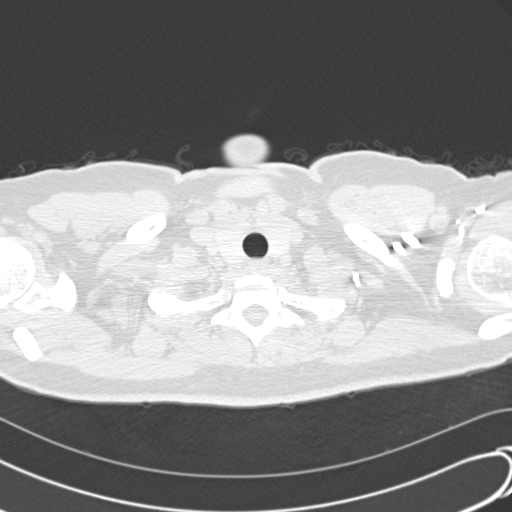
[im 269/284  mediastinal]
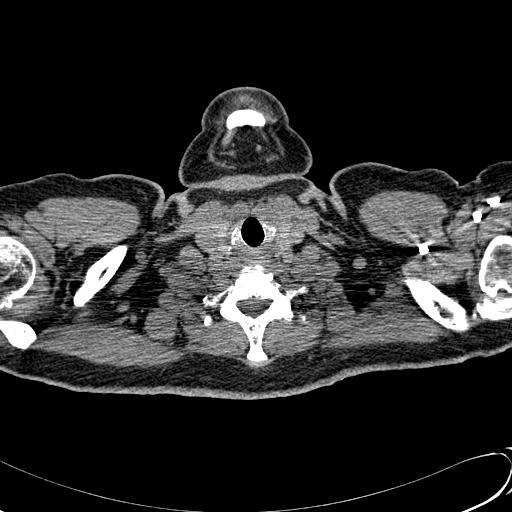

[Series 602: <mpr thick range> · coronal · 0.67mm/px · 1 of 130 slices shown]
[im 65/130  mediastinal]
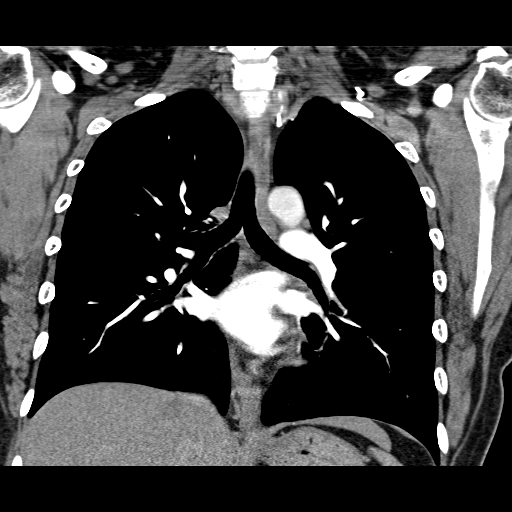

[19 of 36 positions shown; findings below may reference images not displayed]

FINDINGS: Satisfactory opacification of the pulmonary arteries
noted, and there is no evidence of pulmonary emboli.  No evidence
of thoracic aortic aneurysm or dissection.  No evidence of
mediastinal or hilar masses.  No adenopathy seen elsewhere within
the thorax.

There is no evidence of pleural or pericardial effusion. No
suspicious pulmonary nodules or masses are identified.  There is no
evidence of pulmonary infiltrate, and no endobronchial lesion
identified.

No suspicious bone lesions identified.

Review of the MIP images confirms the above findings.
IMPRESSION: Negative.  No evidence of pulmonary embolism or other active
disease.

## 2013-08-04 ENCOUNTER — Other Ambulatory Visit: Payer: Self-pay | Admitting: Dermatology

## 2014-05-16 ENCOUNTER — Emergency Department (HOSPITAL_COMMUNITY)
Admission: EM | Admit: 2014-05-16 | Discharge: 2014-05-16 | Disposition: A | Discharge status: Home / Self Care | Payer: 59 | Source: Home / Self Care | Attending: Emergency Medicine | Admitting: Emergency Medicine

## 2014-05-16 ENCOUNTER — Encounter (HOSPITAL_COMMUNITY): Payer: Self-pay | Admitting: Emergency Medicine

## 2014-05-16 DIAGNOSIS — R69 Illness, unspecified: Principal | ICD-10-CM

## 2014-05-16 DIAGNOSIS — H66001 Acute suppurative otitis media without spontaneous rupture of ear drum, right ear: Secondary | ICD-10-CM

## 2014-05-16 DIAGNOSIS — J111 Influenza due to unidentified influenza virus with other respiratory manifestations: Secondary | ICD-10-CM

## 2014-05-16 MED ORDER — HYDROCOD POLST-CHLORPHEN POLST 10-8 MG/5ML PO LQCR: 5.0000 mL | Freq: Two times a day (BID) | ORAL | Status: DC | PRN

## 2014-05-16 MED ORDER — AMOXICILLIN 500 MG PO CAPS: 1000.0000 mg | ORAL_CAPSULE | Freq: Three times a day (TID) | ORAL | Status: DC

## 2014-05-16 MED ORDER — IPRATROPIUM BROMIDE 0.06 % NA SOLN: 2.0000 | Freq: Four times a day (QID) | NASAL | Status: DC

## 2014-05-16 NOTE — Discharge Instructions (Signed)
Flu is a viral illness caused by the influenza virus. Here are some hints about how to deal with it:  Get extra sleep and extra fluids.  Get 7 to 9 hours of sleep per night and 6 to 8 glasses of water a day.  Getting extra sleep keeps the immune system from getting run down.  Most people with an upper respiratory infection are a little dehydrated.  The extra fluids also keep the secretions liquified and easier to deal with.  Also, get extra vitamin C.  4000 mg per day is the recommended dose. For the aches, headache, and fever, acetaminophen or ibuprofen are helpful.  These can be alternated every 4 hours.  People with liver disease should avoid large amounts of acetaminophen, and people with ulcer disease, gastroesophageal reflux, gastritis, congestive heart failure, chronic kidney disease, coronary artery disease and the elderly should avoid ibuprofen. People with flu should not take aspirin.  For nasal congestion try Mucinex-D, or if you're having lots of sneezing or clear nasal drainage use Zyrtec-D. People with high blood pressure can take these if their blood pressure is controlled, if not, it's best to avoid the forms with a "D" (decongestants).  You can use the plain Mucinex, Allegra, Claritin, or Zyrtec even if your blood pressure is not controlled.   A Saline nasal spray such as Ocean Spray can also help.  You can add a decongestant sprays such as Afrin, but you should not use the decongestant sprays for more than 3 or 4 days since they can be habituating.  Breathe Rite nasal strips can also offer a non-drug alternative treatment to nasal congestion, especially at night. For people with symptoms of sinusitis, sleeping with your head elevated can be helpful.  For sinus pain, moist, hot compresses to the face may provide some relief.  Many people find that inhaling steam as in a shower or from a pot of steaming water can help. For sore throat, throat lozenges are helpful.  Hot salt water gargles (8 oz  of hot water, 1/2 tsp of table salt, and a pinch of baking soda) can give relief as well as hot beverages such as hot tea.  Sucrets extra strength lozenges will help the sore throat.  For the cough, take Delsym 2 tsp every 12 hours.  It has also been found recently that Aleve can help control a cough.  The dose is 1 to 2 tablets twice daily with food.  This can be combined with Delsym. (Note, if you are taking ibuprofen, you should not take Aleve as well--take one or the other.) A cool mist vaporizer will help keep your mucous membranes from drying out.  If you have been prescribed an antiviral medication, be sure to take it until it is completely finished.   It's important when you have an upper respiratory infection not to pass the infection to others.  This involves being very careful about the following:  Frequent hand washing or use of hand sanitizer, especially after coughing, sneezing, blowing your nose or touching your face, nose or eyes. Do not shake hands or touch anyone and try to avoid touching surfaces that other people use such as doorknobs, shopping carts, telephones and computer keyboards. Sanitize your work station, Journalist, newspaper or phone with sanitizing wipes.  Use tissues and dispose of them properly in a garbage can or ziplock bag. Cough into your sleeve. Do not let others eat or drink after you. Wearing a mask is a good idea.  It's also  important to recognize the signs of serious illness and get evaluated if they occur: Any respiratory infection that lasts more than 7 to 10 days.  Yellow nasal drainage and sputum are not reliable indicators of a bacterial infection, but if they last for more than 1 week, see your doctor. Any kind of severe symptom such as difficulty breathing, persistent vomiting, or severe pain should prompt you to see a doctor as soon as possible.

## 2014-05-16 NOTE — ED Notes (Signed)
C/o sore throat and aching on Thursday.  Sore throat has resolved, now has cough and productive cough with green phlegm.  Reports coughing at night is particularly bothersome.  Denies fever.  C/o chest burning.  Patient has a history of bronchitis, and is concerned for this today

## 2014-05-16 NOTE — ED Provider Notes (Signed)
Chief Complaint   URI   History of Present Illness   Sheryl [name] is a 49 year old female who has had a four-day history of generalized myalgias, headache, nasal congestion with green drainage, cough productive green sputum, aching and rib cage when she coughs, hoarseness, sore throat, and temp to 101 at onset. She denies any difficulty breathing, earache, or GI symptoms. 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 Has not gotten a flu vaccine this year.  Review of Systems   Other than as noted above, the patient denies any of the following symptoms: Systemic:  No fevers, chills, sweats, or myalgias. Eye:  No redness or discharge. ENT:  No ear pain, headache, nasal congestion, drainage, sinus pressure, or sore throat. Neck:  No neck pain, stiffness, or swollen glands. Lungs:  No cough, sputum production, hemoptysis, wheezing, chest tightness, shortness of breath or chest pain. GI:  No abdominal pain, nausea, vomiting or diarrhea.  [place]   Past medical history, family history, social history, meds, and allergies were reviewed. She is allergic to aspirin, Flexeril, ibuprofen, naproxen, and Zithromax. She has a listed allergy of hydrocodone, but states she is able to take Tussionex cough syrup. She has a history of hypertension. She takes diltiazem.  Physical exam   Vital signs:  BP 149/99 mmHg  Pulse 85  Temp(Src) 98.2 F (36.8 C) (Oral)  Resp 16  SpO2 96%  LMP 06/29/2010 General:  Alert and oriented.  In no distress.  Skin warm and dry. Eye:  No conjunctival injection or drainage. Lids were normal. ENT:  Long process of malleus of right TM was injected and inflamed. No pus or air-fluid level. Canals clear. Left TM was normal.  Nasal mucosa was clear and uncongested, without drainage.  Mucous membranes were moist.  Pharynx was clear with no exudate or drainage.  There were no oral ulcerations or lesions. Neck:  Supple, no adenopathy, tenderness or  mass. Lungs:  No respiratory distress.  Lungs were clear to auscultation, without wheezes, rales or rhonchi.  Breath sounds were clear and equal bilaterally.  Heart:  Regular rhythm, without gallops, murmers or rubs. Skin:  Clear, warm, and dry, without rash or lesions.    Assessment     The primary encounter diagnosis was Influenza-like illness. A diagnosis of Acute suppurative otitis media of right ear without spontaneous rupture of tympanic membrane, recurrence not specified was also pertinent to this visit.   I offered to prescribe Tamiflu, but noting to her that she was outside of the window where would have it's best effect, that it could cause nausea and vomiting, that it would only reduce her symptom duration by 17 hours at best, and would cost $120. She elected not to take the medication.  Plan    1.  Meds:  The following meds were prescribed:   New Prescriptions   AMOXICILLIN (AMOXIL) 500 MG CAPSULE    Take 2 capsules (1,000 mg total) by mouth 3 (three) times daily.   CHLORPHENIRAMINE-HYDROCODONE (TUSSIONEX) 10-8 MG/5ML LQCR    Take 5 mLs by mouth every 12 (twelve) hours as needed for cough.   IPRATROPIUM (ATROVENT) 0.06 % NASAL SPRAY    Place 2 sprays into both nostrils 4 (four) times daily.    2.  Patient Education/Counseling:  The patient was given appropriate handouts, self care instructions, and instructed in symptomatic relief.  Instructed to get extra fluids and extra rest.    3.  Follow up:  The patient was told  to follow up here if no better in 3 to 4 days, or sooner if becoming worse in any way, and given some red flag symptoms such as increasing fever, difficulty breathing, chest pain, or persistent vomiting which would prompt immediate return.       Harden Mo, MD 05/16/14 [phone]

## 2014-10-05 ENCOUNTER — Emergency Department (HOSPITAL_COMMUNITY)
Admission: EM | Admit: 2014-10-05 | Discharge: 2014-10-06 | Discharge status: Against Medical Advice | Payer: 59 | Attending: Emergency Medicine | Admitting: Emergency Medicine

## 2014-10-05 DIAGNOSIS — S6992XA Unspecified injury of left wrist, hand and finger(s), initial encounter: Secondary | ICD-10-CM | POA: Insufficient documentation

## 2014-10-05 DIAGNOSIS — I10 Essential (primary) hypertension: Secondary | ICD-10-CM | POA: Insufficient documentation

## 2014-10-05 DIAGNOSIS — Y998 Other external cause status: Secondary | ICD-10-CM | POA: Diagnosis not present

## 2014-10-05 DIAGNOSIS — Y9389 Activity, other specified: Secondary | ICD-10-CM | POA: Diagnosis not present

## 2014-10-05 DIAGNOSIS — Y9289 Other specified places as the place of occurrence of the external cause: Secondary | ICD-10-CM | POA: Insufficient documentation

## 2014-10-05 DIAGNOSIS — W1839XA Other fall on same level, initial encounter: Secondary | ICD-10-CM | POA: Insufficient documentation

## 2014-10-05 HISTORY — DX: Personal history of other venous thrombosis and embolism: Z86.718

## 2014-10-06 ENCOUNTER — Encounter (HOSPITAL_COMMUNITY): Payer: Self-pay | Admitting: Emergency Medicine

## 2014-10-06 ENCOUNTER — Emergency Department (HOSPITAL_COMMUNITY): Payer: 59

## 2014-10-06 ENCOUNTER — Ambulatory Visit (INDEPENDENT_AMBULATORY_CARE_PROVIDER_SITE_OTHER): Payer: 59 | Admitting: Urgent Care

## 2014-10-06 VITALS — BP 138/96 | HR 78 | Temp 98.5°F | Resp 18 | Ht 69.0 in | Wt 220.0 lb

## 2014-10-06 DIAGNOSIS — Y92009 Unspecified place in unspecified non-institutional (private) residence as the place of occurrence of the external cause: Secondary | ICD-10-CM

## 2014-10-06 DIAGNOSIS — S62102A Fracture of unspecified carpal bone, left wrist, initial encounter for closed fracture: Secondary | ICD-10-CM

## 2014-10-06 DIAGNOSIS — Y92099 Unspecified place in other non-institutional residence as the place of occurrence of the external cause: Secondary | ICD-10-CM

## 2014-10-06 DIAGNOSIS — M25532 Pain in left wrist: Secondary | ICD-10-CM

## 2014-10-06 DIAGNOSIS — W19XXXA Unspecified fall, initial encounter: Secondary | ICD-10-CM | POA: Diagnosis not present

## 2014-10-06 MED ORDER — OXYCODONE-ACETAMINOPHEN 5-325 MG PO TABS: 1.0000 | ORAL_TABLET | Freq: Three times a day (TID) | ORAL | Status: DC | PRN

## 2014-10-06 NOTE — ED Notes (Signed)
Pt states she fell off her grandson's hover board  Pt states she injured her left wrist  Pt states this happened about 5 this evening

## 2014-10-06 NOTE — Patient Instructions (Signed)
Wrist Fracture A wrist fracture is a break or crack in one of the bones of your wrist. Your wrist is made up of eight small bones at the palm of your hand (carpal bones) and two long bones that make up your forearm (radius and ulna).  CAUSES   A direct blow to the wrist.  Falling on an outstretched hand.  Trauma, such as a car accident or a fall. RISK FACTORS Risk factors for wrist fracture include:   Participating in contact and high-risk sports, such as skiing, biking, and ice skating.  Taking steroid medicines.  Smoking.  Being female.  Being Caucasian.  Drinking more than three alcoholic beverages per day.  Having low or lowered bone density (osteoporosis or osteopenia).  Age. Older adults have decreased bone density.  Women who have had menopause.  History of previous fractures. SIGNS AND SYMPTOMS Symptoms of wrist fractures include tenderness, bruising, and inflammation. Additionally, the wrist may hang in an odd position or appear deformed.  DIAGNOSIS Diagnosis may include:  Physical exam.  X-ray. TREATMENT Treatment depends on many factors, including the nature and location of the fracture, your age, and your activity level. Treatment for wrist fracture can be nonsurgical or surgical.  Nonsurgical Treatment A plaster cast or splint may be applied to your wrist if the bone is in a good position. If the fracture is not in good position, it may be necessary for your health care provider to realign it before applying a splint or cast. Usually, a cast or splint will be worn for several weeks.  Surgical Treatment Sometimes the position of the bone is so far out of place that surgery is required to apply a device to hold it together as it heals. Depending on the fracture, there are a number of options for holding the bone in place while it heals, such as a cast and metal pins.  HOME CARE INSTRUCTIONS  Keep your injured wrist elevated and move your fingers as much as  possible.  Do not put pressure on any part of your cast or splint. It may break.   Use a plastic bag to protect your cast or splint from water while bathing or showering. Do not lower your cast or splint into water.  Take medicines only as directed by your health care provider.  Keep your cast or splint clean and dry. If it becomes wet, damaged, or suddenly feels too tight, contact your health care provider right away.  Do not use any tobacco products including cigarettes, chewing tobacco, or electronic cigarettes. Tobacco can delay bone healing. If you need help quitting, ask your health care provider.  Keep all follow-up visits as directed by your health care provider. This is important.  Ask your health care provider if you should take supplements of calcium and vitamins C and D to promote bone healing. SEEK MEDICAL CARE IF:   Your cast or splint is damaged, breaks, or gets wet.  You have a fever.  You have chills.  You have continued severe pain or more swelling than you did before the cast was put on. SEEK IMMEDIATE MEDICAL CARE IF:   Your hand or fingernails on the injured arm turn blue or gray, or feel cold or numb.  You have decreased feeling in the fingers of your injured arm. MAKE SURE YOU:  Understand these instructions.  Will watch your condition.  Will get help right away if you are not doing well or get worse. Document Released: 01/24/2005 Document Revised:  08/31/2013 Document Reviewed: 05/04/2011 ExitCare Patient Information 2015 [place], [place]. This information is not intended to replace advice given to you by your health care provider. Make sure you discuss any questions you have with your health care provider.

## 2014-10-06 NOTE — Progress Notes (Signed)
    MRN: [id number] DOB: 12/29/1965  Subjective:   Sheryl [name] is a 49 y.o. female presenting for chief complaint of Wrist Injury  Reports 1 day history of fall, left wrist injury. Patient was playing with her grandchildren yesterday, got on a hover board, lost control and fell on outstretched hand, broke her fall with her left hand. Patient fell pain and swelling immediately. She reported to the [facility] ED last night and had x-rays done but was not seen by a provider. She presents today for same. Today she admits significant pain over her left wrist, nonradiating, sharp and constant. She has swelling still over her left wrist. Also admits that she is having a difficult time moving her hand mostly due to pain. Has tried I seemed, Motrin, Asper cream with minimal relief. Denies bruising, bony deformity, laceration, abrasion, numbness, tingling, elbow pain, shoulder pain. Denies smoking or alcohol use. Denies any other aggravating or relieving factors, no other questions or concerns.  Sheryl [name] has a current medication list which includes the following prescription(s): acetaminophen, biotin, calcium-phosphorus-vitamin d, cholecalciferol, diltiazem, diphenhydramine, flaxseed (linseed), inulin, ipratropium, multiple vitamins-minerals, omeprazole, prenatal multivitamin, valsartan-hydrochlorothiazide, vitamin c & e complex, and oxycodone-acetaminophen. She is allergic to cyclobenzaprine; naproxen sodium; zithromax; and eggs or egg-derived products.  [place]  has a past medical history of History of hysterectomy; Hypertension; and H/O blood clots. Also  has past surgical history that includes Abdominal hysterectomy.  ROS As in subjective.  Objective:   Vitals: BP 138/96 mmHg  Pulse 78  Temp(Src) 98.5 F (36.9 C) (Oral)  Resp 18  Ht '5\' 9"'$  (1.753 m)  Wt 220 lb (99.791 kg)  BMI 32.47 kg/m2  SpO2 98%  LMP 06/29/2010  Physical Exam  Constitutional: She appears well-developed and well-nourished.    Cardiovascular: Normal rate.   Pulmonary/Chest: Effort normal.  Musculoskeletal:       Left hand: She exhibits decreased range of motion, tenderness, bony tenderness and swelling (trace edema). She exhibits normal capillary refill, no deformity and no laceration. Normal sensation noted. Decreased strength noted. She exhibits wrist extension trouble.  Skin: Skin is warm and dry. No rash noted. No erythema. No pallor.  No abrasions or ecchymosis noted over her left hand or wrist.   Dg Wrist Complete Left  10/06/2014   CLINICAL DATA:  [place] off a hoverboard this afternoon.  EXAM: LEFT WRIST - COMPLETE 3+ VIEW  COMPARISON:  None.  FINDINGS: There is a triquetrum all fracture, minimally displaced. The other bones of the wrist are intact. Distal radius and ulna are intact.  IMPRESSION: Triquetrum aafracture.   Electronically Signed   By: Andreas Newport M.D.   On: 10/06/2014 01:46   PROCEDURE NOTE: left wrist splint Verbal consent obtained. A 5 x 8 short arm ulnar gutter splint was applied to left wrist. Patient was placed in an arm sling. She tolerated this procedure well.  Assessment and Plan :   1. Wrist fracture, closed, left, initial encounter 2. Left wrist pain 3. Fall at home, initial encounter - Referral to [facility] for further management of left wrist fracture. Her pain take Percocet every 8 hours until evaluated by [facility]. Wrist splint applied. Follow up as needed.  Jaynee Eagles, PA-C [facility] [phone] 10/06/2014 9:32 AM

## 2014-10-06 NOTE — ED Notes (Signed)
Pt told staff that she was leaving and going to urgent care in the morning

## 2016-07-13 ENCOUNTER — Emergency Department (HOSPITAL_COMMUNITY): Payer: BLUE CROSS/BLUE SHIELD

## 2016-07-13 ENCOUNTER — Emergency Department (HOSPITAL_COMMUNITY)
Admission: EM | Admit: 2016-07-13 | Discharge: 2016-07-13 | Disposition: A | Discharge status: Home / Self Care | Payer: BLUE CROSS/BLUE SHIELD | Attending: Emergency Medicine | Admitting: Emergency Medicine

## 2016-07-13 ENCOUNTER — Ambulatory Visit (HOSPITAL_COMMUNITY)
Admission: EM | Admit: 2016-07-13 | Discharge: 2016-07-13 | Disposition: A | Discharge status: Nursing Home (Includes ICF) | Payer: 59

## 2016-07-13 ENCOUNTER — Encounter (HOSPITAL_COMMUNITY): Payer: Self-pay

## 2016-07-13 DIAGNOSIS — Z79899 Other long term (current) drug therapy: Secondary | ICD-10-CM | POA: Diagnosis not present

## 2016-07-13 DIAGNOSIS — R079 Chest pain, unspecified: Secondary | ICD-10-CM | POA: Insufficient documentation

## 2016-07-13 DIAGNOSIS — R51 Headache: Secondary | ICD-10-CM | POA: Insufficient documentation

## 2016-07-13 DIAGNOSIS — I10 Essential (primary) hypertension: Secondary | ICD-10-CM | POA: Diagnosis not present

## 2016-07-13 DIAGNOSIS — R519 Headache, unspecified: Secondary | ICD-10-CM

## 2016-07-13 LAB — CBC
HCT: 39.5 % (ref 36.0–46.0)
Hemoglobin: 13.8 g/dL (ref 12.0–15.0)
MCH: 29.8 pg (ref 26.0–34.0)
MCHC: 34.9 g/dL (ref 30.0–36.0)
MCV: 85.3 fL (ref 78.0–100.0)
Platelets: 287 10*3/uL (ref 150–400)
RBC: 4.63 MIL/uL (ref 3.87–5.11)
RDW: 13.9 % (ref 11.5–15.5)
WBC: 5 10*3/uL (ref 4.0–10.5)

## 2016-07-13 LAB — BASIC METABOLIC PANEL
Anion gap: 10 (ref 5–15)
BUN: 12 mg/dL (ref 6–20)
CO2: 26 mmol/L (ref 22–32)
Calcium: 9.6 mg/dL (ref 8.9–10.3)
Chloride: 103 mmol/L (ref 101–111)
Creatinine, Ser: 0.77 mg/dL (ref 0.44–1.00)
GFR calc Af Amer: >60 mL/min (ref >60)
GFR calc non Af Amer: >60 mL/min (ref >60)
Glucose, Bld: 140 mg/dL — ABNORMAL HIGH (ref 65–99)
Potassium: 3.4 mmol/L — ABNORMAL LOW (ref 3.5–5.1)
Sodium: 139 mmol/L (ref 135–145)

## 2016-07-13 LAB — I-STAT TROPONIN, ED: Troponin i, poc: 0 ng/mL (ref 0.00–0.08)

## 2016-07-13 LAB — TROPONIN I: Troponin I: <0.03 ng/mL (ref <0.03)

## 2016-07-13 MED ORDER — ACETAMINOPHEN 500 MG PO TABS
1000.0000 mg | ORAL_TABLET | Freq: Once | ORAL | Status: AC
Start: 2016-07-13 — End: 2016-07-13
  Administered 2016-07-13: 1000 mg via ORAL
  Filled 2016-07-13: qty 2

## 2016-07-13 NOTE — ED Provider Notes (Addendum)
[facility] DEPT Provider Note   CSN: [id number] Arrival date & time: 07/13/16  1542     History   Chief Complaint Chief Complaint  Patient presents with  . Chest Pain  . Headache    HPI Sheryl [name] is a 51 y.o. female.  HPI  51 year old female with multiple complaints but seems like the reason she is here isaround her left-sided chest discomfort. She states that her blood pressures been high recently she has some chest pain and headache sometimes when this happens. She went to urgent care to get it checked out and he sent here because the chest pain. She also has some back pain and seems to be muscular in nature and worse when she moves it and she states it's sharp in nature. Chest pain is resolved at this time. Was not associated nausea, vomiting or shortness of breath. No lightheadedness or dizziness. Her headache is left-sided around her left frontal sinus. No passing out, fevers, nausea or vomiting with that either.  Past Medical History:  Diagnosis Date  . H/O blood clots   . History of hysterectomy   . Hypertension     There are no active problems to display for this patient.   Past Surgical History:  Procedure Laterality Date  . ABDOMINAL HYSTERECTOMY      OB History    No data available       Home Medications    Prior to Admission medications   Medication Sig Start Date End Date Taking? Authorizing Provider  acetaminophen (TYLENOL) 500 MG tablet Take 500 mg by mouth daily as needed for pain. For pain   Yes Historical Provider, MD  Calcium-Phosphorus-Vitamin D (CALCIUM GUMMIES PO) Take 2 tablets by mouth every morning.   Yes Historical Provider, MD  cholecalciferol (VITAMIN D) 1000 UNITS tablet Take 1,000 Units by mouth daily.   Yes Historical Provider, MD  diltiazem (CARDIZEM) 120 MG tablet Take 120 mg by mouth daily.    Yes Historical Provider, MD  diphenhydrAMINE (BENADRYL) 25 mg capsule Take 25 mg by mouth.   Yes Historical Provider, MD    Flaxseed, Linseed, (FLAXSEED OIL PO) Take 2 tablets by mouth 2 (two) times daily.   Yes Historical Provider, MD  Inulin (FIBER CHOICE PO) Take 2 tablets by mouth at bedtime. Fiber Choice Gummies   Yes Historical Provider, MD  Multiple Vitamins-Minerals (HAIR/SKIN/NAILS PO) Take 2 tablets by mouth at bedtime.   Yes Historical Provider, MD  omeprazole (PRILOSEC) 20 MG capsule Take 20 mg by mouth at bedtime.   Yes Historical Provider, MD  Prenatal Vit-Fe Fumarate-FA (PRENATAL MULTIVITAMIN) TABS Take 2 tablets by mouth at bedtime.   Yes Historical Provider, MD  valsartan-hydrochlorothiazide (DIOVAN-HCT) 160-12.5 MG per tablet Take 1 tablet by mouth every morning.   Yes Historical Provider, MD  Vitamins C E (VITAMIN C & E COMPLEX) 500-400 MG-UNIT CAPS Take 1 tablet by mouth at bedtime.   Yes Historical Provider, MD  Biotin 5000 MCG TABS Take 10,000 mcg by mouth every morning.    Historical Provider, MD  ipratropium (ATROVENT) 0.06 % nasal spray Place 2 sprays into both nostrils 4 (four) times daily. Patient not taking: Reported on 07/13/2016 05/16/14   Harden Mo, MD  oxyCODONE-acetaminophen (ROXICET) 5-325 MG per tablet Take 1 tablet by mouth every 8 (eight) hours as needed for severe pain. Patient not taking: Reported on 07/13/2016 10/06/14   Jaynee Eagles, PA-C    Family History Family History  Problem Relation Age of Onset  .  Stroke Mother   . Diabetes Mother   . Hypertension Mother   . Diabetes Other   . Hypertension Other   . Stroke Other   . Cancer Other   . Heart attack Other     Social History Social History  Substance Use Topics  . Smoking status: Never Smoker  . Smokeless tobacco: Never Used  . Alcohol use No     Allergies   Cyclobenzaprine; Naproxen sodium; Zithromax [azithromycin dihydrate]; and Eggs or egg-derived products   Review of Systems Review of Systems  All other systems reviewed and are negative.    Physical Exam Updated Vital Signs BP 135/78   Pulse  80   Temp 98.7 F (37.1 C) (Oral)   Resp (!) 21   LMP 06/29/2010   SpO2 99%   Physical Exam  Constitutional: She is oriented to person, place, and time. She appears well-developed and well-nourished.  HENT:  Head: Normocephalic and atraumatic.  Eyes: Conjunctivae and EOM are normal.  Neck: Normal range of motion.  Cardiovascular: Normal rate and regular rhythm.   Pulmonary/Chest: No stridor. No respiratory distress.  Abdominal: Soft. She exhibits no distension.  Neurological: She is alert and oriented to person, place, and time. No cranial nerve deficit.  No altered mental status, able to give full seemingly accurate history.  Face is symmetric, EOM's intact, pupils equal and reactive, vision intact, tongue and uvula midline without deviation Upper and Lower extremity motor 5/5, intact pain perception in distal extremities, 2+ reflexes in biceps, patella and achilles tendons. Finger to nose normal, heel to shin normal.   Skin: Skin is warm and dry. No erythema. No pallor.  Nursing note and vitals reviewed.    ED Treatments / Results  Labs (all labs ordered are listed, but only abnormal results are displayed) Labs Reviewed  BASIC METABOLIC PANEL - Abnormal; Notable for the following:       Result Value   Potassium 3.4 (*)    Glucose, Bld 140 (*)    All other components within normal limits  CBC  TROPONIN I  I-STAT TROPOININ, ED    EKG  EKG Interpretation  Date/Time:  Friday July 13 2016 15:47:24 EDT Ventricular Rate:  94 PR Interval:  172 QRS Duration: 76 QT Interval:  346 QTC Calculation: 432 R Axis:   35 Text Interpretation:  Normal sinus rhythm Cannot rule out Anterior infarct , age undetermined Abnormal ECG new TWI in lateral leadsST changes in inferior leads similar to previous Confirmed by [facility] MD, Corene Cornea [id number]) on 07/13/2016 5:50:52 PM       Radiology Dg Chest 2 View  Result Date: 07/13/2016 CLINICAL DATA:  Reason for exam: left-sided chest pain  radiating down left arm; left sided face numbness/tingling; left jaw pain/toothache; denies SOB; denies fever. Pt suspects symptoms are from new HTN medication taken this morning around 5am; symptoms started around 9am this morning. Medical hx: HTN, hx of blood clots. EXAM: CHEST  2 VIEW COMPARISON:  Chest CTA, 01/18/2011.  Chest radiograph, 05/14/2007 FINDINGS: The heart size and mediastinal contours are within normal limits. Both lungs are clear. No pleural effusion or pneumothorax. The visualized skeletal structures are unremarkable. IMPRESSION: No active cardiopulmonary disease. Electronically Signed   By: Lajean Manes M.D.   On: 07/13/2016 16:13   Ct Head Wo Contrast  Result Date: 07/13/2016 CLINICAL DATA:  LEFT-sided facial numbness EXAM: CT HEAD WITHOUT CONTRAST TECHNIQUE: Contiguous axial images were obtained from the base of the skull through the vertex without intravenous  contrast. COMPARISON:  PET-CT 01/18/2011 FINDINGS: Brain: No acute intracranial hemorrhage. No focal mass lesion. No CT evidence of acute infarction. No midline shift or mass effect. No hydrocephalus. Basilar cisterns are patent. Vascular: No hyperdense vessel or unexpected calcification. Skull: Normal. Negative for fracture or focal lesion. Sinuses/Orbits: Paranasal sinuses and mastoid air cells are clear. Orbits are clear. Other: None. IMPRESSION: Normal head CT for age. Electronically Signed   By: Suzy Bouchard M.D.   On: 07/13/2016 20:05    Procedures Procedures (including critical care time)  Medications Ordered in ED Medications  acetaminophen (TYLENOL) tablet 1,000 mg (1,000 mg Oral Given 07/13/16 2008)     Initial Impression / Assessment and Plan / ED Course  I have reviewed the triage vital signs and the nursing notes.  Pertinent labs & imaging results that were available during my care of the patient were reviewed by me and considered in my medical decision making (see chart for details).    I'll rule  out ACS with the front 0 EKGs. We'll check a head CT to make sure there is no inflammation, fractures or masses in the area of her discomfort. Otherwise will treat her symptomatically. Likely stable for discharge with primary care follow-up. Labs and imaging unremarkale. Symptoms improved with tylenol. Will follow up w/ PCP.   Final Clinical Impressions(s) / ED Diagnoses   Final diagnoses:  Nonspecific chest pain  Bad headache      Merrily Pew, MD 07/13/16 [place], MD 07/13/16 2300

## 2016-07-13 NOTE — ED Triage Notes (Signed)
Pt complaining of sharp L sided chest pain that radiates to L arm. Pt denies any cough or SOB. Pt states also had some headache x several days. Pt states recently ran out of BP meds. Pt states restarted meds few days ago.

## 2016-07-13 NOTE — ED Notes (Signed)
Pt   evaulated  By bill    [place]         Pt   Needs  To  Go  To  [facility]    Pt  Sent  By  [place]

## 2017-07-15 ENCOUNTER — Ambulatory Visit (INDEPENDENT_AMBULATORY_CARE_PROVIDER_SITE_OTHER): Payer: Self-pay | Admitting: Physician Assistant

## 2017-07-15 ENCOUNTER — Encounter (INDEPENDENT_AMBULATORY_CARE_PROVIDER_SITE_OTHER): Payer: Self-pay | Admitting: Physician Assistant

## 2017-07-15 ENCOUNTER — Other Ambulatory Visit: Payer: Self-pay

## 2017-07-15 VITALS — BP 150/94 | HR 74 | Temp 97.6°F | Wt 233.2 lb

## 2017-07-15 DIAGNOSIS — Z114 Encounter for screening for human immunodeficiency virus [HIV]: Secondary | ICD-10-CM

## 2017-07-15 DIAGNOSIS — Z1231 Encounter for screening mammogram for malignant neoplasm of breast: Secondary | ICD-10-CM

## 2017-07-15 DIAGNOSIS — Z1159 Encounter for screening for other viral diseases: Secondary | ICD-10-CM

## 2017-07-15 DIAGNOSIS — K219 Gastro-esophageal reflux disease without esophagitis: Secondary | ICD-10-CM

## 2017-07-15 DIAGNOSIS — Z1239 Encounter for other screening for malignant neoplasm of breast: Secondary | ICD-10-CM

## 2017-07-15 DIAGNOSIS — I10 Essential (primary) hypertension: Secondary | ICD-10-CM

## 2017-07-15 MED ORDER — HYDROCHLOROTHIAZIDE 25 MG PO TABS: 25.0000 mg | ORAL_TABLET | Freq: Every day | ORAL | 11 refills | Status: DC

## 2017-07-15 MED ORDER — LANSOPRAZOLE 30 MG PO CPDR: 30.0000 mg | DELAYED_RELEASE_CAPSULE | Freq: Every day | ORAL | 5 refills | Status: DC

## 2017-07-15 MED ORDER — AMLODIPINE BESYLATE 5 MG PO TABS: 5.0000 mg | ORAL_TABLET | Freq: Every day | ORAL | 11 refills | Status: DC

## 2017-07-15 MED FILL — LANSOPRAZOLE DR 30 MG CAPSU: 30 | 30 days supply | Qty: 30 | Fill #0

## 2017-07-15 MED FILL — HYDROCHLOROTHIAZIDE 25 MG T: 25 | 30 days supply | Qty: 30 | Fill #0

## 2017-07-15 MED FILL — AMLODIPINE BESYLATE 5 MG TA: 5 | 30 days supply | Qty: 30 | Fill #0

## 2017-07-15 NOTE — Patient Instructions (Signed)

## 2017-07-15 NOTE — Progress Notes (Signed)
Subjective:  Patient ID: Sheryl [name], female    DOB: Dec 19, 1965  Age: 52 y.o. MRN: [id number]  CC: New patient, medication refill   HPI Sheryl [name] is a 52 y.o. female with a medical history of HTN, Hysterectomy, blood clots in left arm after hysterectomy (cleared 2012), and cataract bilaterally. Feels generally well. Has run out of her anti-hypertensive medication. Endorses occasional abdominal discomfort from a ventral hernia 2/2 surgical procedure,? Liposuction? Does not endorse CP, palpitations, SOB, PND, orthopnea, presyncope, syncope, HA, or edema.     Outpatient Medications Prior to Visit  Medication Sig Dispense Refill  . acetaminophen (TYLENOL) 500 MG tablet Take 500 mg by mouth daily as needed for pain. For pain    . cholecalciferol (VITAMIN D) 1000 UNITS tablet Take 1,000 Units by mouth daily.    . diphenhydrAMINE (BENADRYL) 25 mg capsule Take 25 mg by mouth.    . lansoprazole (PREVACID) 30 MG capsule Take 30 mg by mouth daily at 12 noon.    . Prenatal Vit-Fe Fumarate-FA (PRENATAL MULTIVITAMIN) TABS Take 2 tablets by mouth at bedtime.    . valsartan-hydrochlorothiazide (DIOVAN-HCT) 160-12.5 MG per tablet Take 1 tablet by mouth every morning.    . diltiazem (CARDIZEM) 120 MG tablet Take 120 mg by mouth daily.     . Vitamins C E (VITAMIN C & E COMPLEX) 500-400 MG-UNIT CAPS Take 1 tablet by mouth at bedtime.    . Biotin 5000 MCG TABS Take 10,000 mcg by mouth every morning.    . Calcium-Phosphorus-Vitamin D (CALCIUM GUMMIES PO) Take 2 tablets by mouth every morning.    . Flaxseed, Linseed, (FLAXSEED OIL PO) Take 2 tablets by mouth 2 (two) times daily.    . Inulin (FIBER CHOICE PO) Take 2 tablets by mouth at bedtime. Fiber Choice Gummies    . ipratropium (ATROVENT) 0.06 % nasal spray Place 2 sprays into both nostrils 4 (four) times daily. (Patient not taking: Reported on 07/13/2016) 15 mL 12  . Multiple Vitamins-Minerals (HAIR/SKIN/NAILS PO) Take 2 tablets by mouth at  bedtime.    Marland Kitchen omeprazole (PRILOSEC) 20 MG capsule Take 20 mg by mouth at bedtime.    Marland Kitchen oxyCODONE-acetaminophen (ROXICET) 5-325 MG per tablet Take 1 tablet by mouth every 8 (eight) hours as needed for severe pain. (Patient not taking: Reported on 07/13/2016) 20 tablet 0   No facility-administered medications prior to visit.      ROS Review of Systems  Constitutional: Negative for chills, fever and malaise/fatigue.  Eyes: Negative for blurred vision.  Respiratory: Negative for shortness of breath.   Cardiovascular: Negative for chest pain and palpitations.  Gastrointestinal: Positive for abdominal pain. Negative for nausea.  Genitourinary: Negative for dysuria and hematuria.  Musculoskeletal: Negative for joint pain and myalgias.  Skin: Negative for rash.  Neurological: Negative for tingling and headaches.  Psychiatric/Behavioral: Negative for depression. The patient is not nervous/anxious.     Objective:  BP (!) 150/94 (BP Location: Left Arm, Patient Position: Sitting, Cuff Size: Large)   Pulse 74   Temp 97.6 F (36.4 C) (Oral)   Wt 233 lb 3.2 oz (105.8 kg)   LMP 06/29/2010   SpO2 97%   BMI 34.44 kg/m   BP/Weight 07/15/2017 0/86/5784 10/06/6293  Systolic BP 284 132 440  Diastolic BP 94 78 96  Wt. (Lbs) 233.2 - 220  BMI 34.44 - 32.47      Physical Exam  Constitutional: She is oriented to person, place, and time.  Well developed, overweight, NAD,  polite  HENT:  Head: Normocephalic and atraumatic.  Eyes: Conjunctivae are normal. Pupils are equal, round, and reactive to light. No scleral icterus.  Neck: Normal range of motion. Neck supple. No thyromegaly present.  Cardiovascular: Normal rate, regular rhythm and normal heart sounds.  Pulmonary/Chest: Effort normal and breath sounds normal.  Abdominal: Soft. Bowel sounds are normal. There is no tenderness.  Musculoskeletal: She exhibits no edema.  Neurological: She is alert and oriented to person, place, and time. No cranial  nerve deficit. Coordination normal.  UE and LE strength 5/5 bilaterally  Skin: Skin is warm and dry. No rash noted. No erythema. No pallor.  Psychiatric: She has a normal mood and affect. Her behavior is normal. Thought content normal.  Vitals reviewed.    Assessment & Plan:   1. Hypertension, unspecified type - Comprehensive metabolic panel; Future - CBC with Differential; Future - TSH; Future - Lipid panel; Future - Refill hydrochlorothiazide (HYDRODIURIL) 25 MG tablet; Take 1 tablet (25 mg total) by mouth daily. Take on tablet in the morning.  Dispense: 30 tablet; Refill: 11 - Begin amLODipine (NORVASC) 5 MG tablet; Take 1 tablet (5 mg total) by mouth daily.  Dispense: 30 tablet; Refill: 11  2. Screening for HIV (human immunodeficiency virus) - HIV antibody; Future  3. Encounter for hepatitis C screening test for low risk patient - Hepatitis c antibody (reflex); Future  4. Screening for breast cancer - [place] DIGITAL SCREENING BILATERAL; Future  5. Gastroesophageal reflux disease, esophagitis presence not specified - Refill lansoprazole (PREVACID) 30 MG capsule; Take 1 capsule (30 mg total) by mouth daily at 12 noon.  Dispense: 30 capsule; Refill: 5   Meds ordered this encounter  Medications  . DISCONTD: hydrochlorothiazide (HYDRODIURIL) 25 MG tablet    Sig: Take 1 tablet (25 mg total) by mouth daily. Take on tablet in the morning.    Dispense:  30 tablet    Refill:  11    Order Specific Question:   Supervising Provider    Answer:   Tresa Garter [id number]  . DISCONTD: amLODipine (NORVASC) 5 MG tablet    Sig: Take 1 tablet (5 mg total) by mouth daily.    Dispense:  30 tablet    Refill:  11    Order Specific Question:   Supervising Provider    Answer:   Tresa Garter [id number]  . DISCONTD: lansoprazole (PREVACID) 30 MG capsule    Sig: Take 1 capsule (30 mg total) by mouth daily at 12 noon.    Dispense:  30 capsule    Refill:  5    Order Specific Question:    Supervising Provider    Answer:   Tresa Garter [id number]  . lansoprazole (PREVACID) 30 MG capsule    Sig: Take 1 capsule (30 mg total) by mouth daily at 12 noon.    Dispense:  30 capsule    Refill:  5    Order Specific Question:   Supervising Provider    Answer:   Tresa Garter [id number]  . hydrochlorothiazide (HYDRODIURIL) 25 MG tablet    Sig: Take 1 tablet (25 mg total) by mouth daily. Take on tablet in the morning.    Dispense:  30 tablet    Refill:  11    Order Specific Question:   Supervising Provider    Answer:   Tresa Garter [id number]  . amLODipine (NORVASC) 5 MG tablet    Sig: Take 1 tablet (5 mg total) by mouth daily.  Dispense:  30 tablet    Refill:  11    Order Specific Question:   Supervising Provider    Answer:   Tresa Garter [[id number]]    Follow-up: Return in about 2 weeks (around 07/29/2017) for BP check and labs.   Clent Demark PA

## 2017-07-24 ENCOUNTER — Ambulatory Visit: Payer: Self-pay | Attending: Family Medicine

## 2017-07-29 ENCOUNTER — Encounter (INDEPENDENT_AMBULATORY_CARE_PROVIDER_SITE_OTHER): Payer: Self-pay

## 2017-07-29 ENCOUNTER — Other Ambulatory Visit: Payer: Self-pay

## 2017-07-29 ENCOUNTER — Ambulatory Visit (INDEPENDENT_AMBULATORY_CARE_PROVIDER_SITE_OTHER): Payer: Self-pay | Admitting: Physician Assistant

## 2017-07-29 DIAGNOSIS — I10 Essential (primary) hypertension: Secondary | ICD-10-CM

## 2017-07-29 DIAGNOSIS — Z114 Encounter for screening for human immunodeficiency virus [HIV]: Secondary | ICD-10-CM

## 2017-07-29 DIAGNOSIS — Z1159 Encounter for screening for other viral diseases: Secondary | ICD-10-CM

## 2017-07-29 NOTE — Progress Notes (Signed)
Vitals:   07/29/17 1125  BP: 137/86  Pulse: 68  SpO2: 98%   BP has improved over previous visit. Continue same treatment regimen. Pt had labs drawn today.

## 2017-07-30 LAB — COMPREHENSIVE METABOLIC PANEL
ALT: 56 IU/L — ABNORMAL HIGH (ref 0–32)
AST: 50 IU/L — ABNORMAL HIGH (ref 0–40)
Albumin/Globulin Ratio: 1.8 (ref 1.2–2.2)
Albumin: 4.5 g/dL (ref 3.5–5.5)
Alkaline Phosphatase: 125 IU/L — ABNORMAL HIGH (ref 39–117)
BUN/Creatinine Ratio: 17 (ref 9–23)
BUN: 13 mg/dL (ref 6–24)
Bilirubin Total: 0.7 mg/dL (ref 0.0–1.2)
CO2: 26 mmol/L (ref 20–29)
Calcium: 9.6 mg/dL (ref 8.7–10.2)
Chloride: 99 mmol/L (ref 96–106)
Creatinine, Ser: 0.78 mg/dL (ref 0.57–1.00)
GFR calc Af Amer: 100 mL/min/{1.73_m2} (ref >59)
GFR calc non Af Amer: 87 mL/min/{1.73_m2} (ref >59)
Globulin, Total: 2.5 g/dL (ref 1.5–4.5)
Glucose: 112 mg/dL — ABNORMAL HIGH (ref 65–99)
Potassium: 3.5 mmol/L (ref 3.5–5.2)
Sodium: 139 mmol/L (ref 134–144)
Total Protein: 7 g/dL (ref 6.0–8.5)

## 2017-07-30 LAB — CBC WITH DIFFERENTIAL/PLATELET
Basophils Absolute: 0 10*3/uL (ref 0.0–0.2)
Basos: 1 %
EOS (ABSOLUTE): 0.1 10*3/uL (ref 0.0–0.4)
Eos: 3 %
Hematocrit: 42.2 % (ref 34.0–46.6)
Hemoglobin: 14.5 g/dL (ref 11.1–15.9)
Immature Grans (Abs): 0 10*3/uL (ref 0.0–0.1)
Immature Granulocytes: 0 %
Lymphocytes Absolute: 1.2 10*3/uL (ref 0.7–3.1)
Lymphs: 30 %
MCH: 29.5 pg (ref 26.6–33.0)
MCHC: 34.4 g/dL (ref 31.5–35.7)
MCV: 86 fL (ref 79–97)
Monocytes Absolute: 0.3 10*3/uL (ref 0.1–0.9)
Monocytes: 9 %
Neutrophils Absolute: 2.3 10*3/uL (ref 1.4–7.0)
Neutrophils: 57 %
Platelets: 313 10*3/uL (ref 150–379)
RBC: 4.91 x10E6/uL (ref 3.77–5.28)
RDW: 13.6 % (ref 12.3–15.4)
WBC: 4 10*3/uL (ref 3.4–10.8)

## 2017-07-30 LAB — LIPID PANEL
Chol/HDL Ratio: 4.3 ratio (ref 0.0–4.4)
Cholesterol, Total: 188 mg/dL (ref 100–199)
HDL: 44 mg/dL (ref >39)
LDL Calculated: 125 mg/dL — ABNORMAL HIGH (ref 0–99)
Triglycerides: 96 mg/dL (ref 0–149)
VLDL Cholesterol Cal: 19 mg/dL (ref 5–40)

## 2017-07-30 LAB — HCV COMMENT:

## 2017-07-30 LAB — HEPATITIS C ANTIBODY (REFLEX): HCV Ab: <0.1 s/co ratio (ref 0.0–0.9)

## 2017-07-30 LAB — TSH: TSH: 1.66 u[IU]/mL (ref 0.450–4.500)

## 2017-07-30 LAB — HIV ANTIBODY (ROUTINE TESTING W REFLEX): HIV Screen 4th Generation wRfx: NONREACTIVE

## 2017-07-31 ENCOUNTER — Telehealth (INDEPENDENT_AMBULATORY_CARE_PROVIDER_SITE_OTHER): Payer: Self-pay

## 2017-07-31 ENCOUNTER — Other Ambulatory Visit (INDEPENDENT_AMBULATORY_CARE_PROVIDER_SITE_OTHER): Payer: Self-pay | Admitting: Physician Assistant

## 2017-07-31 DIAGNOSIS — E7841 Elevated Lipoprotein(a): Secondary | ICD-10-CM

## 2017-07-31 MED ORDER — LOVASTATIN 40 MG PO TABS: 40.0000 mg | ORAL_TABLET | Freq: Every day | ORAL | 3 refills | Status: DC

## 2017-07-31 MED FILL — LOVASTATIN 40 MG TABLET: 40 | 30 days supply | Qty: 30 | Fill #0

## 2017-07-31 NOTE — Telephone Encounter (Signed)
Patient called requesting lovastatin be sent to [facility] pharmacy. Changed medication to [facility] pharmacy. Nat Christen, CMA

## 2017-07-31 NOTE — Telephone Encounter (Signed)
Left patient a message informing of mildly elevated liver and cholesterol lover enzymes, which may be from fatty liver. Abstain from fatty/fried foods and alcohol. Medication to help reduce cholesterol sent to [facility] on [address] We monitor liver enzymes over time. Please call the office with any questions. Nat Christen, CMA

## 2017-07-31 NOTE — Telephone Encounter (Signed)
-----   Message from Clent Demark, PA-C sent at 07/31/2017  9:45 AM EDT ----- Cholesterol and liver enzymes are mildly elevated. May be from fatty liver. Avoid fatty/fried foods and alcohol. I will send cholesterol medication to [facility] on [address] We will monitor liver enzymes over time.

## 2017-08-21 MED FILL — AMLODIPINE BESYLATE 5 MG TA: 5 | 30 days supply | Qty: 30 | Fill #1

## 2017-08-21 MED FILL — HYDROCHLOROTHIAZIDE 25 MG T: 25 | 30 days supply | Qty: 30 | Fill #1

## 2017-08-21 MED FILL — LANSOPRAZOLE DR 30 MG CAPSU: 30 | 30 days supply | Qty: 30 | Fill #1

## 2017-09-13 MED FILL — LOVASTATIN 40 MG TABLET: 40 | 30 days supply | Qty: 30 | Fill #1

## 2017-09-26 MED FILL — LANSOPRAZOLE DR 30 MG CAPSU: 30 | 30 days supply | Qty: 30 | Fill #2

## 2017-09-26 MED FILL — HYDROCHLOROTHIAZIDE 25 MG T: 25 | 30 days supply | Qty: 30 | Fill #2

## 2017-10-16 MED FILL — ?AMLODIPINE BESYLATE 5 MG T: 5 MG | 30 days supply | Qty: 30 | Fill #2

## 2017-10-16 MED FILL — LOVASTATIN 40 MG TABLET: 40 | 30 days supply | Qty: 30 | Fill #2

## 2017-10-30 MED FILL — HYDROCHLOROTHIAZIDE 25 MG T: 25 | 30 days supply | Qty: 30 | Fill #3

## 2017-11-04 MED FILL — ?LANSOPRAZOLE DR 30MG CAPSU: 30 | 30 days supply | Qty: 30 | Fill #3

## 2017-12-06 MED FILL — ?LANSOPRAZOLE DR 30MG CAPSU: 30 | 30 days supply | Qty: 30 | Fill #4

## 2017-12-06 MED FILL — ?AMLODIPINE BESYLATE 5 MG T: 5 MG | 30 days supply | Qty: 30 | Fill #3

## 2017-12-06 MED FILL — HYDROCHLOROTHIAZIDE 25 MG T: 25 | 30 days supply | Qty: 30 | Fill #4

## 2017-12-06 MED FILL — LOVASTATIN 40 MG TABS: 40 | 30 days supply | Qty: 30 | Fill #3

## 2018-01-06 MED FILL — LOVASTATIN 40 MG TABS: 40 | 30 days supply | Qty: 30 | Fill #4

## 2018-01-06 MED FILL — HYDROCHLOROTHIAZIDE 25 MG T: 25 | 30 days supply | Qty: 30 | Fill #5

## 2018-01-06 MED FILL — ?LANSOPRAZOLE DR 30MG CAPSU: 30 | 30 days supply | Qty: 30 | Fill #5

## 2018-01-06 MED FILL — ?AMLODIPINE BESYLATE 5 MG T: 5 MG | 30 days supply | Qty: 30 | Fill #4

## 2018-10-22 ENCOUNTER — Other Ambulatory Visit: Payer: Self-pay | Admitting: *Deleted

## 2018-10-22 DIAGNOSIS — Z20822 Contact with and (suspected) exposure to covid-19: Secondary | ICD-10-CM

## 2018-10-22 NOTE — Progress Notes (Signed)
lab

## 2018-10-27 LAB — NOVEL CORONAVIRUS, NAA: SARS-CoV-2, NAA: NOT DETECTED

## 2019-02-18 ENCOUNTER — Encounter (INDEPENDENT_AMBULATORY_CARE_PROVIDER_SITE_OTHER): Payer: Self-pay | Admitting: Primary Care

## 2019-02-18 ENCOUNTER — Ambulatory Visit (INDEPENDENT_AMBULATORY_CARE_PROVIDER_SITE_OTHER): Payer: Self-pay | Admitting: Primary Care

## 2019-02-18 ENCOUNTER — Other Ambulatory Visit: Payer: Self-pay

## 2019-02-18 VITALS — BP 146/91 | HR 83 | Temp 97.5°F | Ht 69.0 in | Wt 236.4 lb

## 2019-02-18 DIAGNOSIS — I10 Essential (primary) hypertension: Secondary | ICD-10-CM

## 2019-02-18 DIAGNOSIS — Z1231 Encounter for screening mammogram for malignant neoplasm of breast: Secondary | ICD-10-CM

## 2019-02-18 DIAGNOSIS — N750 Cyst of Bartholin's gland: Secondary | ICD-10-CM

## 2019-02-18 DIAGNOSIS — K219 Gastro-esophageal reflux disease without esophagitis: Secondary | ICD-10-CM

## 2019-02-18 DIAGNOSIS — Z23 Encounter for immunization: Secondary | ICD-10-CM

## 2019-02-18 MED ORDER — AMLODIPINE BESYLATE 5 MG PO TABS: 5.0000 mg | ORAL_TABLET | Freq: Every day | ORAL | 3 refills | Status: DC

## 2019-02-18 MED ORDER — FLUCONAZOLE 150 MG PO TABS: 150.0000 mg | ORAL_TABLET | Freq: Once | ORAL | 0 refills | Status: AC

## 2019-02-18 MED ORDER — HYDROCHLOROTHIAZIDE 25 MG PO TABS: 25.0000 mg | ORAL_TABLET | Freq: Every day | ORAL | 3 refills | Status: DC

## 2019-02-18 MED ORDER — LANSOPRAZOLE 30 MG PO CPDR: 30.0000 mg | DELAYED_RELEASE_CAPSULE | Freq: Every day | ORAL | 3 refills | Status: DC

## 2019-02-18 MED ORDER — SULFAMETHOXAZOLE-TRIMETHOPRIM 800-160 MG PO TABS: 1.0000 | ORAL_TABLET | Freq: Two times a day (BID) | ORAL | 0 refills | Status: DC

## 2019-02-18 MED FILL — ?SULFAMETHOXAZOLE-TMP DS TB: 800-160 | 7 days supply | Qty: 14 | Fill #0

## 2019-02-18 MED FILL — FLUCONAZOLE 150 MG TABLET: 150 | 1 days supply | Qty: 1 | Fill #0

## 2019-02-18 MED FILL — ?HYDROCHLOROTHIAZIDE 25MG T: 25 | 30 days supply | Qty: 30 | Fill #0

## 2019-02-18 MED FILL — LANSOPRAZOLE DR 30 MG CAPSU: 30 | 30 days supply | Qty: 30 | Fill #0

## 2019-02-18 MED FILL — ?AMLODIPINE BESYLATE 5MG TA: 5 | 30 days supply | Qty: 30 | Fill #0

## 2019-02-18 NOTE — Progress Notes (Signed)
Established Patient Office Visit  Subjective:  Patient ID: Sheryl [name], female    DOB: 1965-07-10  Age: 53 y.o. MRN: [id number]  CC:  Chief Complaint  Patient presents with  . Recurrent Skin Infections    exterior labia  . Medication Refill    HPI Sheryl [name] presents for boil that appeared in your vaginal area she had picture that she shared with Probation officer. How the process went for the Cyst.  Past Medical History:  Diagnosis Date  . H/O blood clots   . History of hysterectomy   . Hypertension     Past Surgical History:  Procedure Laterality Date  . ABDOMINAL HYSTERECTOMY      Family History  Problem Relation Age of Onset  . Stroke Mother   . Diabetes Mother   . Hypertension Mother   . Diabetes Other   . Hypertension Other   . Stroke Other   . Cancer Other   . Heart attack Other     Social History   Socioeconomic History  . Marital status: Widowed    Spouse name: Not on file  . Number of children: Not on file  . Years of education: Not on file  . Highest education level: Not on file  Occupational History  . Not on file  Social Needs  . Financial resource strain: Not on file  . Food insecurity    Worry: Not on file    Inability: Not on file  . Transportation needs    Medical: Not on file    Non-medical: Not on file  Tobacco Use  . Smoking status: Never Smoker  . Smokeless tobacco: Never Used  Substance and Sexual Activity  . Alcohol use: No  . Drug use: No  . Sexual activity: Yes    Birth control/protection: Surgical  Lifestyle  . Physical activity    Days per week: Not on file    Minutes per session: Not on file  . Stress: Not on file  Relationships  . Social Herbalist on phone: Not on file    Gets together: Not on file    Attends religious service: Not on file    Active member of club or organization: Not on file    Attends meetings of clubs or organizations: Not on file    Relationship status: Not on file  . Intimate  partner violence    Fear of current or ex partner: Not on file    Emotionally abused: Not on file    Physically abused: Not on file    Forced sexual activity: Not on file  Other Topics Concern  . Not on file  Social History Narrative  . Not on file    Outpatient Medications Prior to Visit  Medication Sig Dispense Refill  . amLODipine (NORVASC) 5 MG tablet Take 1 tablet (5 mg total) by mouth daily. (Patient not taking: Reported on 02/18/2019) 30 tablet 11  . hydrochlorothiazide (HYDRODIURIL) 25 MG tablet Take 1 tablet (25 mg total) by mouth daily. Take on tablet in the morning. (Patient not taking: Reported on 02/18/2019) 30 tablet 11  . acetaminophen (TYLENOL) 500 MG tablet Take 500 mg by mouth daily as needed for pain. For pain    . cholecalciferol (VITAMIN D) 1000 UNITS tablet Take 1,000 Units by mouth daily.    . diphenhydrAMINE (BENADRYL) 25 mg capsule Take 25 mg by mouth.    . lansoprazole (PREVACID) 30 MG capsule Take 1 capsule (30 mg total)  by mouth daily at 12 noon. 30 capsule 5  . lovastatin (MEVACOR) 40 MG tablet Take 1 tablet (40 mg total) by mouth at bedtime. 90 tablet 3   No facility-administered medications prior to visit.     Allergies  Allergen Reactions  . Aspirin Anaphylaxis  . Cyclobenzaprine Anaphylaxis  . Naproxen Sodium Anaphylaxis  . Zithromax [Azithromycin Dihydrate] Anaphylaxis    ROS Review of Systems  Skin:       bartholin cyst      Objective:    Physical Exam  Constitutional: She is oriented to person, place, and time. She appears well-developed and well-nourished.  HENT:  Head: Normocephalic.  Eyes: Pupils are equal, round, and reactive to light. EOM are normal.  Neck: Normal range of motion.  Cardiovascular: Normal rate and regular rhythm.  Pulmonary/Chest: Effort normal and breath sounds normal.  Abdominal: Soft. Bowel sounds are normal.  Musculoskeletal: Normal range of motion.  Neurological: She is alert and oriented to person,  place, and time.  Skin: Skin is warm and dry.  bartholin cyst   Psychiatric: She has a normal mood and affect.    BP (!) 146/91 (BP Location: Right Arm, Patient Position: Sitting, Cuff Size: Large)   Pulse 83   Temp (!) 97.5 F (36.4 C) (Temporal)   Ht _0  (1.753 m)   Wt 236 lb 6.4 oz (107.2 kg)   LMP 06/29/2010   SpO2 94%   BMI 34.91 kg/m  Wt Readings from Last 3 Encounters:  02/18/19 236 lb 6.4 oz (107.2 kg)  07/15/17 233 lb 3.2 oz (105.8 kg)  10/06/14 220 lb (99.8 kg)     Health Maintenance Due  Topic Date Due  . PAP SMEAR-Modifier  08/05/1984  . MAMMOGRAM  07/23/2014  . INFLUENZA VACCINE  11/29/2018    There are no preventive care reminders to display for this patient.  Lab Results  Component Value Date   TSH 1.660 07/29/2017   Lab Results  Component Value Date   WBC 4.0 07/29/2017   HGB 14.5 07/29/2017   HCT 42.2 07/29/2017   MCV 86 07/29/2017   PLT 313 07/29/2017   Lab Results  Component Value Date   NA 139 07/29/2017   K 3.5 07/29/2017   CO2 26 07/29/2017   GLUCOSE 112 (H) 07/29/2017   BUN 13 07/29/2017   CREATININE 0.78 07/29/2017   BILITOT 0.7 07/29/2017   ALKPHOS 125 (H) 07/29/2017   AST 50 (H) 07/29/2017   ALT 56 (H) 07/29/2017   PROT 7.0 07/29/2017   ALBUMIN 4.5 07/29/2017   CALCIUM 9.6 07/29/2017   ANIONGAP 10 07/13/2016   Lab Results  Component Value Date   CHOL 188 07/29/2017   Lab Results  Component Value Date   HDL 44 07/29/2017   Lab Results  Component Value Date   LDLCALC 125 (H) 07/29/2017   Lab Results  Component Value Date   TRIG 96 07/29/2017   Lab Results  Component Value Date   CHOLHDL 4.3 07/29/2017   No results found for: HGBA1C    Assessment & Plan:   Julicia was seen today for recurrent skin infections and medication refill.  Diagnoses and all orders for this visit:  Bartholin's gland cyst Explain it was a fluid filled cyst bartholin gland painful continue sitz baths, warm clothes and sent in  antibiotics. Also, review information from Up to date on treatment and causes  Hypertension, unspecified type Counseled on blood pressure goal of less than 130/80, low-sodium, DASH diet, medication  compliance, 150 minutes of moderate intensity exercise per week. Discussed medication compliance, adverse effects. Refilled  -     CBC with Differential/Platelet; Future -     CMP14+EGFR; Future -     Lipid panel; Future -     amLODipine (NORVASC) 5 MG tablet; Take 1 tablet (5 mg total) by mouth daily. -     hydrochlorothiazide (HYDRODIURIL) 25 MG tablet; Take 1 tablet (25 mg total) by mouth daily. Take on tablet in the morning.  Gastroesophageal reflux disease, unspecified whether esophagitis present Discussed eating small frequent meal, reduction in acidic foods, fried foods ,spicy foods, alcohol caffeine and tobacco and certain medications. Avoid laying down after eating 74mns-1hour, elevated head of the bed.  lansoprazole (PREVACID) 30 MG capsule; Take 1 capsule (30 mg total) by mouth daily at 12 noon.    Encounter for screening mammogram for malignant neoplasm of breast Patient completed application for [facility] while in clinic [facility]  -    Need for immunization against influenza -     Flu Vaccine QUAD 36+ mos IM  Other orders -     sulfamethoxazole-trimethoprim (BACTRIM DS) 800-160 MG tablet; Take 1 tablet by mouth 2 (two) times daily. -     fluconazole (DIFLUCAN) 150 MG tablet; Take 1 tablet (150 mg total) by mouth once for 1 dose.   No orders of the defined types were placed in this encounter.   Follow-up: No follow-ups on file.    MKerin Perna NP

## 2019-02-18 NOTE — Patient Instructions (Addendum)
Bartholin Cyst patient information from Up to date Bartholin's Cyst  A Bartholin's cyst is a fluid-filled sac that forms on a Bartholin's gland. Bartholin's glands are small glands in the folds of skin around the opening of the vagina (labia). This type of cyst causes a bulge or lump near the lower opening of the vagina. If you have a cyst that is small and not infected, you may be able to take care of it at home. If your cyst gets infected, it may cause pain and your doctor may need to drain it. An infected Bartholin's cyst is called a Bartholin's abscess. Follow these instructions at home: Medicines  Take over-the-counter and prescription medicines only as told by your doctor.  If you were prescribed an antibiotic medicine, take it as told by your doctor. Do not stop taking the antibiotic even if you start to feel better. Managing pain and swelling  Try sitz baths to help with pain and swelling. A sitz bath is a warm water bath in which the water only comes up to your hips and should cover your buttocks. You may take sitz baths a few times a day.  Put heat on the affected area as often as needed. Use the heat source that your doctor recommends, such as a moist heat pack or a heating pad. ? Place a towel between your skin and the heat source. ? Leave the heat on for 20-30 minutes. ? Remove the heat if your skin turns bright red. This is especially important if you cannot feel pain, heat, or cold. You may have a greater risk of getting burned. General instructions  If your cyst or abscess was drained: ? Follow instructions from your doctor about how to take care of your wound. ? Use feminine pads to absorb any fluid.  Do not push on or squeeze your cyst.  Do not have sex until the cyst has gone away or your wound from drainage has healed.  Take these steps to help prevent a Bartholin's cyst from returning, and to prevent other Bartholin's cysts from forming: ? Take a bath or shower once  a day. Clean your vaginal area with mild soap and water when you bathe. ? Practice safe sex to prevent STIs (sexually transmitted infections). Talk with your doctor about how to prevent STIs and which forms of birth control (contraception) may be best for you.  Keep all follow-up visits as told by your doctor. This is important. Contact a doctor if:  You have a fever.  You get redness, swelling, or pain around your cyst.  You have fluid, blood, pus, or a bad smell coming from your cyst.  You have a cyst that gets larger or comes back. Summary  A Bartholin's cyst is a fluid-filled sac that forms on a Bartholin's gland. These small glands are found in the folds of skin around the opening of the vagina (labia).  This type of cyst causes a bulge or lump near the lower opening of the vagina. An infected Bartholin's cyst is called a Bartholin's abscess.  Try sitz baths a few times a day to help with pain and swelling.  Do not push on or squeeze your cyst. This information is not intended to replace advice given to you by your health care provider. Make sure you discuss any questions you have with your health care provider. Document Released: 07/13/2008 Document Revised: 02/06/2018 Document Reviewed: 01/16/2017 Elsevier Patient Education  2020 [employer].

## 2019-02-20 ENCOUNTER — Other Ambulatory Visit: Payer: Self-pay

## 2019-02-20 ENCOUNTER — Other Ambulatory Visit (INDEPENDENT_AMBULATORY_CARE_PROVIDER_SITE_OTHER): Payer: Self-pay

## 2019-02-20 DIAGNOSIS — I10 Essential (primary) hypertension: Secondary | ICD-10-CM

## 2019-02-21 LAB — CMP14+EGFR
ALT: 81 IU/L — ABNORMAL HIGH (ref 0–32)
AST: 84 IU/L — ABNORMAL HIGH (ref 0–40)
Albumin/Globulin Ratio: 1.5 (ref 1.2–2.2)
Albumin: 4.4 g/dL (ref 3.8–4.9)
Alkaline Phosphatase: 212 IU/L — ABNORMAL HIGH (ref 39–117)
BUN/Creatinine Ratio: 12 (ref 9–23)
BUN: 11 mg/dL (ref 6–24)
Bilirubin Total: 0.5 mg/dL (ref 0.0–1.2)
CO2: 24 mmol/L (ref 20–29)
Calcium: 9.9 mg/dL (ref 8.7–10.2)
Chloride: 100 mmol/L (ref 96–106)
Creatinine, Ser: 0.93 mg/dL (ref 0.57–1.00)
GFR calc Af Amer: 80 mL/min/{1.73_m2} (ref >59)
GFR calc non Af Amer: 69 mL/min/{1.73_m2} (ref >59)
Globulin, Total: 3 g/dL (ref 1.5–4.5)
Glucose: 204 mg/dL — ABNORMAL HIGH (ref 65–99)
Potassium: 4.2 mmol/L (ref 3.5–5.2)
Sodium: 138 mmol/L (ref 134–144)
Total Protein: 7.4 g/dL (ref 6.0–8.5)

## 2019-02-21 LAB — LIPID PANEL
Chol/HDL Ratio: 4.1 ratio (ref 0.0–4.4)
Cholesterol, Total: 199 mg/dL (ref 100–199)
HDL: 48 mg/dL (ref >39)
LDL Chol Calc (NIH): 131 mg/dL — ABNORMAL HIGH (ref 0–99)
Triglycerides: 111 mg/dL (ref 0–149)
VLDL Cholesterol Cal: 20 mg/dL (ref 5–40)

## 2019-02-21 LAB — CBC WITH DIFFERENTIAL/PLATELET
Basophils Absolute: 0 10*3/uL (ref 0.0–0.2)
Basos: 1 %
EOS (ABSOLUTE): 0.2 10*3/uL (ref 0.0–0.4)
Eos: 4 %
Hematocrit: 45.8 % (ref 34.0–46.6)
Hemoglobin: 15 g/dL (ref 11.1–15.9)
Immature Grans (Abs): 0 10*3/uL (ref 0.0–0.1)
Immature Granulocytes: 0 %
Lymphocytes Absolute: 1.3 10*3/uL (ref 0.7–3.1)
Lymphs: 28 %
MCH: 29.1 pg (ref 26.6–33.0)
MCHC: 32.8 g/dL (ref 31.5–35.7)
MCV: 89 fL (ref 79–97)
Monocytes Absolute: 0.6 10*3/uL (ref 0.1–0.9)
Monocytes: 12 %
Neutrophils Absolute: 2.6 10*3/uL (ref 1.4–7.0)
Neutrophils: 55 %
Platelets: 307 10*3/uL (ref 150–450)
RBC: 5.15 x10E6/uL (ref 3.77–5.28)
RDW: 13 % (ref 11.7–15.4)
WBC: 4.7 10*3/uL (ref 3.4–10.8)

## 2019-03-01 ENCOUNTER — Other Ambulatory Visit (INDEPENDENT_AMBULATORY_CARE_PROVIDER_SITE_OTHER): Payer: Self-pay | Admitting: Primary Care

## 2019-03-01 DIAGNOSIS — R7309 Other abnormal glucose: Secondary | ICD-10-CM

## 2019-03-19 MED FILL — LANSOPRAZOLE DR 30 MG CAPSU: 30 | 30 days supply | Qty: 30 | Fill #1

## 2019-03-19 MED FILL — ?AMLODIPINE BESYLATE 5MG TA: 5 | 30 days supply | Qty: 30 | Fill #1

## 2019-03-19 MED FILL — ?HYDROCHLOROTHIAZIDE 25MG T: 25 | 30 days supply | Qty: 30 | Fill #1

## 2019-04-10 ENCOUNTER — Other Ambulatory Visit: Payer: Self-pay

## 2019-04-10 DIAGNOSIS — Z20822 Contact with and (suspected) exposure to covid-19: Secondary | ICD-10-CM

## 2019-04-12 LAB — NOVEL CORONAVIRUS, NAA: SARS-CoV-2, NAA: NOT DETECTED

## 2019-04-16 MED FILL — ?LANSOPRAZOLE DR 30MG CAPSU: 30 | 30 days supply | Qty: 30 | Fill #2

## 2019-04-16 MED FILL — ?HYDROCHLOROTHIAZIDE 25MG T: 25 | 30 days supply | Qty: 30 | Fill #2

## 2019-04-16 MED FILL — AMLODIPINE BESYLATE 5 MG TA: 5 | 30 days supply | Qty: 30 | Fill #2

## 2019-05-21 ENCOUNTER — Other Ambulatory Visit: Payer: Self-pay

## 2019-05-21 ENCOUNTER — Other Ambulatory Visit (INDEPENDENT_AMBULATORY_CARE_PROVIDER_SITE_OTHER): Payer: Self-pay | Admitting: Primary Care

## 2019-05-21 ENCOUNTER — Ambulatory Visit (INDEPENDENT_AMBULATORY_CARE_PROVIDER_SITE_OTHER): Payer: Self-pay | Admitting: Primary Care

## 2019-05-21 ENCOUNTER — Encounter (INDEPENDENT_AMBULATORY_CARE_PROVIDER_SITE_OTHER): Payer: Self-pay | Admitting: Primary Care

## 2019-05-21 ENCOUNTER — Other Ambulatory Visit (HOSPITAL_COMMUNITY)
Admission: RE | Admit: 2019-05-21 | Discharge: 2019-05-21 | Disposition: A | Discharge status: Home / Self Care | Payer: Self-pay | Source: Ambulatory Visit | Attending: Primary Care | Admitting: Primary Care

## 2019-05-21 VITALS — BP 131/89 | HR 85 | Temp 97.2°F | Ht 69.0 in | Wt 226.6 lb

## 2019-05-21 DIAGNOSIS — Z1231 Encounter for screening mammogram for malignant neoplasm of breast: Secondary | ICD-10-CM

## 2019-05-21 DIAGNOSIS — I10 Essential (primary) hypertension: Secondary | ICD-10-CM

## 2019-05-21 DIAGNOSIS — E7800 Pure hypercholesterolemia, unspecified: Secondary | ICD-10-CM

## 2019-05-21 DIAGNOSIS — N898 Other specified noninflammatory disorders of vagina: Secondary | ICD-10-CM

## 2019-05-21 DIAGNOSIS — K219 Gastro-esophageal reflux disease without esophagitis: Secondary | ICD-10-CM

## 2019-05-21 DIAGNOSIS — R7309 Other abnormal glucose: Secondary | ICD-10-CM

## 2019-05-21 DIAGNOSIS — E119 Type 2 diabetes mellitus without complications: Secondary | ICD-10-CM

## 2019-05-21 HISTORY — DX: Type 2 diabetes mellitus without complications: E11.9

## 2019-05-21 LAB — POCT GLYCOSYLATED HEMOGLOBIN (HGB A1C): Hemoglobin A1C: 11.2 % — AB (ref 4.0–5.6)

## 2019-05-21 LAB — GLUCOSE, POCT (MANUAL RESULT ENTRY): POC Glucose: 283 mg/dl — AB (ref 70–99)

## 2019-05-21 MED ORDER — LANSOPRAZOLE 30 MG PO CPDR: 30.0000 mg | DELAYED_RELEASE_CAPSULE | Freq: Every day | ORAL | 1 refills | Status: DC

## 2019-05-21 MED ORDER — AMLODIPINE BESYLATE 10 MG PO TABS: 10.0000 mg | ORAL_TABLET | Freq: Every day | ORAL | 1 refills | Status: DC

## 2019-05-21 MED ORDER — MONISTAT 1 COMBO PACK 1200 & 2 MG & % VA KIT: 1.0000 | PACK | Freq: Once | VAGINAL | 0 refills | Status: AC

## 2019-05-21 MED ORDER — JANUMET 50-1000 MG PO TABS: 1.0000 | ORAL_TABLET | Freq: Two times a day (BID) | ORAL | 1 refills | Status: DC

## 2019-05-21 MED ORDER — TRUE METRIX METER W/DEVICE KIT: 1.0000 | PACK | Freq: Three times a day (TID) | 0 refills | Status: DC | PRN

## 2019-05-21 MED ORDER — PRAVASTATIN SODIUM 20 MG PO TABS: 20.0000 mg | ORAL_TABLET | Freq: Every day | ORAL | 3 refills | Status: DC

## 2019-05-21 MED ORDER — GLIPIZIDE 10 MG PO TABS: 10.0000 mg | ORAL_TABLET | Freq: Two times a day (BID) | ORAL | 3 refills | Status: DC

## 2019-05-21 MED ORDER — HYDROCHLOROTHIAZIDE 25 MG PO TABS: 25.0000 mg | ORAL_TABLET | Freq: Every day | ORAL | 1 refills | Status: DC

## 2019-05-21 MED FILL — ?PRAVASTATIN NA 20MG TABL: 20 | 30 days supply | Qty: 30 | Fill #0

## 2019-05-21 MED FILL — !TRUE METRIX BLOOD GLUCOSE: 365 days supply | Qty: 1 | Fill #0

## 2019-05-21 MED FILL — glipiZIDE 10 MG TABS: 10 | 30 days supply | Qty: 60 | Fill #0

## 2019-05-21 MED FILL — AMLODIPINE BESYLATE 10 MG T: 10 | 30 days supply | Qty: 30 | Fill #0

## 2019-05-21 MED FILL — JANUMET 50-1,000 MG TABLET: 50-1000 | 30 days supply | Qty: 60 | Fill #0

## 2019-05-21 MED FILL — ?LANSOPRAZOLE DR 30MG CAPSU: 30 | 30 days supply | Qty: 30 | Fill #0

## 2019-05-21 MED FILL — ?HYDROCHLOROTHIAZIDE 25MG T: 25 | 30 days supply | Qty: 30 | Fill #0

## 2019-05-21 NOTE — Progress Notes (Signed)
Established Patient Office Visit  Subjective:  Patient ID: Sheryl [name], female    DOB: [birth year]  Age: 54 y.o. MRN: [id number]  CC:  Chief Complaint  Patient presents with  . Follow-up    HTN  . Vaginitis    HPI Sheryl [name] presents for management of blood pressure-she denies shortness of breath, headaches, chest pain or lower extremity edema and complaints of vaginal discaharge.  Past Medical History:  Diagnosis Date  . H/O blood clots   . History of hysterectomy   . Hypertension     Past Surgical History:  Procedure Laterality Date  . ABDOMINAL HYSTERECTOMY      Family History  Problem Relation Age of Onset  . Stroke Mother   . Diabetes Mother   . Hypertension Mother   . Diabetes Other   . Hypertension Other   . Stroke Other   . Cancer Other   . Heart attack Other     Social History   Socioeconomic History  . Marital status: Widowed    Spouse name: Not on file  . Number of children: Not on file  . Years of education: Not on file  . Highest education level: Not on file  Occupational History  . Not on file  Tobacco Use  . Smoking status: Never Smoker  . Smokeless tobacco: Never Used  Substance and Sexual Activity  . Alcohol use: No  . Drug use: No  . Sexual activity: Yes    Birth control/protection: Surgical  Other Topics Concern  . Not on file  Social History Narrative  . Not on file   Social Determinants of Health   Financial Resource Strain:   . Difficulty of Paying Living Expenses: Not on file  Food Insecurity:   . Worried About Charity fundraiser in the Last Year: Not on file  . Ran Out of Food in the Last Year: Not on file  Transportation Needs:   . Lack of Transportation (Medical): Not on file  . Lack of Transportation (Non-Medical): Not on file  Physical Activity:   . Days of Exercise per Week: Not on file  . Minutes of Exercise per Session: Not on file  Stress:   . Feeling of Stress : Not on file  Social Connections:    . Frequency of Communication with Friends and Family: Not on file  . Frequency of Social Gatherings with Friends and Family: Not on file  . Attends Religious Services: Not on file  . Active Member of Clubs or Organizations: Not on file  . Attends Archivist Meetings: Not on file  . Marital Status: Not on file  Intimate Partner Violence:   . Fear of Current or Ex-Partner: Not on file  . Emotionally Abused: Not on file  . Physically Abused: Not on file  . Sexually Abused: Not on file    Outpatient Medications Prior to Visit  Medication Sig Dispense Refill  . amLODipine (NORVASC) 5 MG tablet Take 1 tablet (5 mg total) by mouth daily. 30 tablet 3  . hydrochlorothiazide (HYDRODIURIL) 25 MG tablet Take 1 tablet (25 mg total) by mouth daily. Take on tablet in the morning. 30 tablet 3  . lansoprazole (PREVACID) 30 MG capsule Take 1 capsule (30 mg total) by mouth daily at 12 noon. 30 capsule 3  . sulfamethoxazole-trimethoprim (BACTRIM DS) 800-160 MG tablet Take 1 tablet by mouth 2 (two) times daily. 14 tablet 0   No facility-administered medications prior to visit.  Allergies  Allergen Reactions  . Aspirin Anaphylaxis  . Cyclobenzaprine Anaphylaxis  . Naproxen Sodium Anaphylaxis  . Zithromax [Azithromycin Dihydrate] Anaphylaxis    ROS Review of Systems  Genitourinary: Positive for vaginal discharge.  All other systems reviewed and are negative.     Objective:    Physical Exam  Constitutional: She is oriented to person, place, and time. She appears well-developed and well-nourished.  HENT:  Head: Normocephalic.  Cardiovascular: Normal rate.  Pulmonary/Chest: Effort normal and breath sounds normal.  Abdominal: Soft. Bowel sounds are normal.  Musculoskeletal:     Cervical back: Normal range of motion and neck supple.  Neurological: She is oriented to person, place, and time. She has normal reflexes.  Skin: Skin is warm and dry.  Psychiatric: She has a normal mood  and affect. Her behavior is normal. Judgment and thought content normal.    BP 131/89 (BP Location: Right Arm, Patient Position: Sitting, Cuff Size: Large)   Pulse 85   Temp (!) 97.2 F (36.2 C) (Temporal)   Ht 5' 9"  (1.753 m)   Wt 226 lb 9.6 oz (102.8 kg)   LMP 06/29/2010   SpO2 96%   BMI 33.46 kg/m  Wt Readings from Last 3 Encounters:  05/21/19 226 lb 9.6 oz (102.8 kg)  02/18/19 236 lb 6.4 oz (107.2 kg)  07/15/17 233 lb 3.2 oz (105.8 kg)     Health Maintenance Due  Topic Date Due  . PAP SMEAR-Modifier  08/05/1984  . MAMMOGRAM  07/23/2014    There are no preventive care reminders to display for this patient.  Lab Results  Component Value Date   TSH 1.660 07/29/2017   Lab Results  Component Value Date   WBC 4.7 02/20/2019   HGB 15.0 02/20/2019   HCT 45.8 02/20/2019   MCV 89 02/20/2019   PLT 307 02/20/2019   Lab Results  Component Value Date   NA 138 02/20/2019   K 4.2 02/20/2019   CO2 24 02/20/2019   GLUCOSE 204 (H) 02/20/2019   BUN 11 02/20/2019   CREATININE 0.93 02/20/2019   BILITOT 0.5 02/20/2019   ALKPHOS 212 (H) 02/20/2019   AST 84 (H) 02/20/2019   ALT 81 (H) 02/20/2019   PROT 7.4 02/20/2019   ALBUMIN 4.4 02/20/2019   CALCIUM 9.9 02/20/2019   ANIONGAP 10 07/13/2016   Lab Results  Component Value Date   CHOL 199 02/20/2019   Lab Results  Component Value Date   HDL 48 02/20/2019   Lab Results  Component Value Date   LDLCALC 131 (H) 02/20/2019   Lab Results  Component Value Date   TRIG 111 02/20/2019   Lab Results  Component Value Date   CHOLHDL 4.1 02/20/2019   No results found for: HGBA1C    Assessment & Plan:  Aritzel was seen today for follow-up and vaginitis.  Diagnoses and all orders for this visit:  Hypertension, unspecified type Counseled on blood pressure goal of less than 130/80, low-sodium, DASH diet, medication compliance, 150 minutes of moderate intensity exercise per week. Continue the following Bp medication listed  below -     hydrochlorothiazide (HYDRODIURIL) 25 MG tablet; Take 1 tablet (25 mg total) by mouth daily. Take on tablet in the morning. -     amLODipine (NORVASC) 10 MG tablet; Take 1 tablet (10 mg total) by mouth daily.  Gastroesophageal reflux disease without esophagitis Discussed eating small frequent meal, reduction in acidic foods, fried foods ,spicy foods, alcohol caffeine and tobacco and certain medications. Avoid  laying down after eating 6mns-1hour, elevated head of the bed. -     lansoprazole (PREVACID) 30 MG capsule; Take 1 capsule (30 mg total) by mouth daily at 12 noon.  Vaginal irritation -     Cervicovaginal ancillary only  Elevated glucose On previous CBC noted to check A1C on follow up  -     HgB A1c 11.2  -     Glucose (CBG) -     Microalbumin, urine; Future -     Microalbumin, urine  Encounter for screening mammogram for malignant neoplasm of breast Patient completed application for [facility] while in clinic [facility].  Type 2 diabetes mellitus without complication, without long-term current use of insulin (HCC) DIABETIC TEACHING 15 mins Discussed A1C, treatment for diabetes place on oral agents janumet 50/1000 bid and glipizide 121mBID 3 months medication regiment, diet modification and exercise. If A1C </= 8.9 will discuss insulin  New diagnosis  Recommendations For Diabetic/Prediabetic Patients:  - Exercise at least 5 times a week for 30 minutes or preferably daily.  - No Smoking - Drink less than 2 drinks a day.  - Monitor your feet for sores - Have yearly Eye Exams - Recommend annual Flu vaccine  - Recommend Pneumovax and Prevnar vaccines - Shingles Vaccine (Zostavax) if over 6051.o.  Goals:   - BMI less than 24 - Fasting sugar less than 130 or less than 150 if tapering medicines to lose weight  - Systolic BP less than 13111- Diastolic BP less than 80 -  Bad LDL Cholesterol less than 70 - Triglycerides less than 150  Elevated LDL cholesterol level Elevated cholesterol  can lead to heart attack and stroke. To lower your number you can decrease your fatty foods, red meat, cheese, milk and increase fiber like whole grains and veggies. You will need to take your cholesterol medication nightly -     pravastatin (PRAVACHOL) 20 MG tablet; Take 1 tablet (20 mg total) by mouth daily.  Other orders -     miconazole (MONISTAT 1 COMBO PACK) kit; Place 1 each vaginally once for 1 dose. -     sitaGLIPtin-metformin (JANUMET) 50-1000 MG tablet; Take 1 tablet by mouth 2 (two) times daily with a meal. -     glipiZIDE (GLUCOTROL) 10 MG tablet; Take 1 tablet (10 mg total) by mouth 2 (two) times daily before a meal. -     Blood Glucose Monitoring Suppl (TRUE METRIX METER) w/Device KIT; 1 kit by Does not apply route 3 (three) times daily as needed.   No orders of the defined types were placed in this encounter.   Follow-up: No follow-ups on file.    MiKerin PernaNP

## 2019-05-21 NOTE — Patient Instructions (Addendum)
Food Choices for Gastroesophageal Reflux Disease, Adult When you have gastroesophageal reflux disease (GERD), the foods you eat and your eating habits are very important. Choosing the right foods can help ease your discomfort. Think about working with a nutrition specialist (dietitian) to help you make good choices. What are tips for following this plan?  Meals  Choose healthy foods that are low in fat, such as fruits, vegetables, whole grains, low-fat dairy products, and lean meat, fish, and poultry.  Eat small meals often instead of 3 large meals a day. Eat your meals slowly, and in a place where you are relaxed. Avoid bending over or lying down until 2-3 hours after eating.  Avoid eating meals 2-3 hours before bed.  Avoid drinking a lot of liquid with meals.  Cook foods using methods other than frying. Bake, grill, or broil food instead.  Avoid or limit: ? Chocolate. ? Peppermint or spearmint. ? Alcohol. ? Pepper. ? Black and decaffeinated coffee. ? Black and decaffeinated tea. ? Bubbly (carbonated) soft drinks. ? Caffeinated energy drinks and soft drinks.  Limit high-fat foods such as: ? Fatty meat or fried foods. ? Whole milk, cream, butter, or ice cream. ? Nuts and nut butters. ? Pastries, donuts, and sweets made with butter or shortening.  Avoid foods that cause symptoms. These foods may be different for everyone. Common foods that cause symptoms include: ? Tomatoes. ? Oranges, lemons, and limes. ? Peppers. ? Spicy food. ? Onions and garlic. ? Vinegar. Lifestyle  Maintain a healthy weight. Ask your doctor what weight is healthy for you. If you need to lose weight, work with your doctor to do so safely.  Exercise for at least 30 minutes for 5 or more days each week, or as told by your doctor.  Wear loose-fitting clothes.  Do not smoke. If you need help quitting, ask your doctor.  Sleep with the head of your bed higher than your feet. Use a wedge under the  mattress or blocks under the bed frame to raise the head of the bed. Summary  When you have gastroesophageal reflux disease (GERD), food and lifestyle choices are very important in easing your symptoms.  Eat small meals often instead of 3 large meals a day. Eat your meals slowly, and in a place where you are relaxed.  Limit high-fat foods such as fatty meat or fried foods.  Avoid bending over or lying down until 2-3 hours after eating.  Avoid peppermint and spearmint, caffeine, alcohol, and chocolate. This information is not intended to replace advice given to you by your health care provider. Make sure you discuss any questions you have with your health care provider. Document Revised: 08/07/2018 Document Reviewed: 05/22/2016 Elsevier Patient Education  [employer] Diabetes Association (ADA) has long recommended looser control for people who are more frail. In these official guidelines, they recommend an A1C target of 7.5% for healthy people over 65, as compared to 7.0% for younger people. For people with other illnesses or impairments, their goal is 8.0% .

## 2019-05-22 ENCOUNTER — Telehealth (INDEPENDENT_AMBULATORY_CARE_PROVIDER_SITE_OTHER): Payer: Self-pay

## 2019-05-22 LAB — MICROALBUMIN, URINE: Microalbumin, Urine: 45.1 ug/mL

## 2019-05-22 NOTE — Telephone Encounter (Signed)
Sent to PCP ?

## 2019-05-22 NOTE — Telephone Encounter (Signed)
Spoke with patient and informed her that no medication had been sent. Patient needs Rx for yeast not UTI. PCP stated she would send monistat during patient visit yesterday. However; pharmacy does not carry monistat. Patient would like Rx for Diflucan; as it was prescribed to her before to treat yeast. Please send Rx as soon as possible.

## 2019-05-22 NOTE — Telephone Encounter (Signed)
Patient called wanting to know the status of her medication.   Please advice

## 2019-05-22 NOTE — Telephone Encounter (Signed)
Patient had an appointment yesterday and was advice by PCP that she would send medication for her UTI. Patient states she went to the pharmacy and medication was sent.  Patient uses [facility]  Please advice [phone]  Thank you Whitney Post

## 2019-05-23 ENCOUNTER — Other Ambulatory Visit (INDEPENDENT_AMBULATORY_CARE_PROVIDER_SITE_OTHER): Payer: Self-pay | Admitting: Primary Care

## 2019-05-23 MED ORDER — FLUCONAZOLE 150 MG PO TABS: 150.0000 mg | ORAL_TABLET | Freq: Once | ORAL | 0 refills | Status: AC

## 2019-05-23 NOTE — Telephone Encounter (Signed)
No meds sent in for  UTI. Patient ask for medication it is over the counter. That's what she specifically asked for sent in [place]

## 2019-05-25 MED FILL — FLUCONAZOLE 150 MG TABS: 150 | 3 days supply | Qty: 2 | Fill #0

## 2019-05-25 NOTE — Telephone Encounter (Signed)
Patient is aware that diflucan has been sent to the pharmacy.

## 2019-05-27 LAB — CERVICOVAGINAL ANCILLARY ONLY
Bacterial Vaginitis (gardnerella): NEGATIVE
Candida Glabrata: NEGATIVE
Candida Vaginitis: NEGATIVE
Chlamydia: NEGATIVE
Comment: NEGATIVE
Comment: NEGATIVE
Comment: NEGATIVE
Comment: NEGATIVE
Comment: NEGATIVE
Comment: NORMAL
Neisseria Gonorrhea: NEGATIVE
Trichomonas: NEGATIVE

## 2019-05-28 ENCOUNTER — Telehealth (INDEPENDENT_AMBULATORY_CARE_PROVIDER_SITE_OTHER): Payer: Self-pay

## 2019-05-28 NOTE — Telephone Encounter (Signed)
Please advise 

## 2019-05-28 NOTE — Telephone Encounter (Signed)
Patient called to inform her PCP that her vision has become blurry and does not know if this could be a side effect of   pravastatin (PRAVACHOL) 20 MG tablet   sitaGLIPtin-metformin (JANUMET) 50-1000 MG tablet   glipiZIDE (GLUCOTROL) 10 MG tablet   States this has never had this happen to her. Patient states that when she wakes up in the morning her blood sugar is low. Patient readings are in the morning 92 yesterday 70 day before yesterday 94. This readings are before eating.   please advice [phone]

## 2019-06-01 NOTE — Telephone Encounter (Signed)
All meds filled on 05/21/2019

## 2019-06-01 NOTE — Telephone Encounter (Signed)
Patient aware that blurred vision is a symptom of her uncontrolled diabetes. Advised patient to continue taking medications as directed. If vision becomes worse call and notify PCP. Nat Christen, CMA

## 2019-06-11 ENCOUNTER — Ambulatory Visit (INDEPENDENT_AMBULATORY_CARE_PROVIDER_SITE_OTHER): Payer: Self-pay | Admitting: Primary Care

## 2019-06-11 ENCOUNTER — Other Ambulatory Visit: Payer: Self-pay

## 2019-06-11 ENCOUNTER — Encounter (INDEPENDENT_AMBULATORY_CARE_PROVIDER_SITE_OTHER): Payer: Self-pay | Admitting: Primary Care

## 2019-06-11 DIAGNOSIS — E7800 Pure hypercholesterolemia, unspecified: Secondary | ICD-10-CM

## 2019-06-11 DIAGNOSIS — I10 Essential (primary) hypertension: Secondary | ICD-10-CM

## 2019-06-11 DIAGNOSIS — E119 Type 2 diabetes mellitus without complications: Secondary | ICD-10-CM

## 2019-06-11 NOTE — Progress Notes (Signed)
Bp this am is 133/88 without medication Pt stopped diabetes medication because it was making her sick  She was unable to eat and vision was blurred  CBGs before eating ranged between 130-141 After eating 115-160

## 2019-06-11 NOTE — Progress Notes (Signed)
Virtual Visit via Telephone Note  I connected with Sheryl [name] on 06/11/19 at  9:10 AM EST by telephone and verified that I am speaking with the correct person using two identifiers.   I discussed the limitations, risks, security and privacy concerns of performing an evaluation and management service by telephone and the availability of in person appointments. I also discussed with the patient that there may be a patient responsible charge related to this service. The patient expressed understanding and agreed to proceed.   History of Present Illness: Sheryl [name] is having a tele visit for blood pressure and diabetes. Blood pressures systolic range from 130-865 and diastolic range from 78-46. She is taking her blood pressure medication daily. Patient stopped taking her diabetes medication because her fasting blood sugars ranged from 70-141 and after eating 115-160. Thought because she has change her diet and exercising her blood sugar would go down. Asked her what her A1C was response 11.3 ( was 11.2) asked what did A1C represent stated my blood sugars for 3 months. Explained so if blood sugars just recently started ranging from 70-160. Why would you stop your medication. Discussed diabetic retinopathy, HD, risk of amputations ,unhealing sores and  poor circulations. One of the 2 pills made her sick after a 20 minute discussion she decided she would start taking Janumet 50/1000 twice daily and hold off on the glipizide to determine which one made her sick. Also stop taking cholesterol medication discussed increased risk of heart attack and strokes .   Observations/Objective: Review of Systems  All other systems reviewed and are negative.   Assessment and Plan: [name] was seen today for blood pressure check.  Diagnoses and all orders for this visit:  Type 2 diabetes mellitus without complication, without long-term current use of insulin ([place]) . Patient stopped taking her diabetes  medication because her fasting blood sugars ranged from 70-141 and after eating 115-160. Thought because she has change her diet and exercising her blood sugar would go down. Asked her what her A1C was response 11.3 ( was 11.2) asked what did A1C represent stated my blood sugars for 3 months. Explained so if blood sugars just recently started ranging from 70-160. Why would you stop your medication. Discussed diabetic retinopathy, HD, risk of amputations ,unhealing sores and  poor circulations. One of the 2 pills made her sick after a 20 minute discussion she decided she would start taking Janumet 50/1000 twice daily and hold off on the glipizide to determine which one made her sick.    Hypertension, unspecified type Continue to take blood pressure write them down to review take medications as prescribe goal is 130/80 </=. Decrease sodium in diet and exercise this can improve hypertension and diabetes.   Elevated LDL cholesterol level Your LDL is not in range. Your LDL is the bad cholesterol that can lead to heart attack and stroke. To lower your number you can decrease your fatty foods, red meat, cheese, milk and increase fiber like whole grains and veggies. After extensive teaching of plaque build up in arteries. Patient decided she would start taking her pravastatin nightly    I discussed the assessment and treatment plan with the patient. The patient was provided an opportunity to ask questions and all were answered. The patient agreed with the plan and demonstrated an understanding of the instructions.   The patient was advised to call back or seek an in-person evaluation if the symptoms worsen or if the condition fails to improve  as anticipated.  I provided 28 minutes of non-face-to-face time during this encounter. Extensive reiterating diabetes education risk and complications.    Kerin Perna, NP

## 2019-06-25 MED FILL — AMLODIPINE BESYLATE 10 MG T: 10 | 30 days supply | Qty: 30 | Fill #1

## 2019-06-25 MED FILL — JANUMET 50-1,000 MG TABLET: 50-1000 | 30 days supply | Qty: 60 | Fill #1

## 2019-06-25 MED FILL — ?HYDROCHLOROTHIAZIDE 25MG T: 25 | 30 days supply | Qty: 30 | Fill #1

## 2019-06-25 MED FILL — ?PRAVASTATIN NA 20MG TABL: 20 | 30 days supply | Qty: 30 | Fill #1

## 2019-07-29 MED FILL — ?HYDROCHLOROTHIAZIDE 25MG T: 25 | 30 days supply | Qty: 30 | Fill #2

## 2019-07-29 MED FILL — ?PRAVASTATIN NA 20MG TABL: 20 | 30 days supply | Qty: 30 | Fill #2

## 2019-07-29 MED FILL — JANUMET 50-1,000 MG TABLET: 50-1000 | 30 days supply | Qty: 60 | Fill #2

## 2019-07-29 MED FILL — ?LANSOPRAZOLE DR 30MG CAPSU: 30 | 30 days supply | Qty: 30 | Fill #1

## 2019-07-29 MED FILL — AMLODIPINE BESYLATE 10 MG T: 10 | 30 days supply | Qty: 30 | Fill #2

## 2019-08-13 ENCOUNTER — Ambulatory Visit (INDEPENDENT_AMBULATORY_CARE_PROVIDER_SITE_OTHER): Payer: No Typology Code available for payment source | Admitting: Primary Care

## 2019-08-13 ENCOUNTER — Encounter (INDEPENDENT_AMBULATORY_CARE_PROVIDER_SITE_OTHER): Payer: Self-pay | Admitting: Primary Care

## 2019-08-13 ENCOUNTER — Other Ambulatory Visit: Payer: Self-pay

## 2019-08-13 VITALS — BP 120/80 | HR 71 | Temp 97.5°F | Resp 16 | Ht 68.0 in | Wt 208.0 lb

## 2019-08-13 DIAGNOSIS — E119 Type 2 diabetes mellitus without complications: Secondary | ICD-10-CM

## 2019-08-13 LAB — POCT GLYCOSYLATED HEMOGLOBIN (HGB A1C): Hemoglobin A1C: 6.1 % — AB (ref 4.0–5.6)

## 2019-08-13 LAB — GLUCOSE, POCT (MANUAL RESULT ENTRY): POC Glucose: 112 mg/dl — AB (ref 70–99)

## 2019-08-13 NOTE — Progress Notes (Signed)
Established Patient Office Visit  Subjective:  Patient ID: Sheryl [name], female    DOB: 1965/06/22  Age: 54 y.o. MRN: [id number]  CC:  Chief Complaint  Patient presents with  . Follow-up    DM    HPI [name]  Is a 54 year old female alert, oriented and appropriately dress. She presents to disclaim her diagnosis of type 2 diabetes. She has down and incredible job A1C 11.2 in January 2021 and today 6.1. We discussed the diagnosis of Prediabetes and provided information regarding. She is also concerned about 2 other problems jaw popping and bulging from abdomen neither present on exam today.  Past Medical History:  Diagnosis Date  . H/O blood clots   . History of hysterectomy   . Hypertension     Past Surgical History:  Procedure Laterality Date  . ABDOMINAL HYSTERECTOMY      Family History  Problem Relation Age of Onset  . Stroke Mother   . Diabetes Mother   . Hypertension Mother   . Diabetes Other   . Hypertension Other   . Stroke Other   . Cancer Other   . Heart attack Other     Social History   Socioeconomic History  . Marital status: Widowed    Spouse name: Not on file  . Number of children: Not on file  . Years of education: Not on file  . Highest education level: Not on file  Occupational History  . Not on file  Tobacco Use  . Smoking status: Never Smoker  . Smokeless tobacco: Never Used  Substance and Sexual Activity  . Alcohol use: No  . Drug use: No  . Sexual activity: Yes    Birth control/protection: Surgical  Other Topics Concern  . Not on file  Social History Narrative  . Not on file   Social Determinants of Health   Financial Resource Strain:   . Difficulty of Paying Living Expenses:   Food Insecurity:   . Worried About Charity fundraiser in the Last Year:   . Arboriculturist in the Last Year:   Transportation Needs:   . Film/video editor (Medical):   Marland Kitchen Lack of Transportation (Non-Medical):   Physical Activity:    . Days of Exercise per Week:   . Minutes of Exercise per Session:   Stress:   . Feeling of Stress :   Social Connections:   . Frequency of Communication with Friends and Family:   . Frequency of Social Gatherings with Friends and Family:   . Attends Religious Services:   . Active Member of Clubs or Organizations:   . Attends Archivist Meetings:   Marland Kitchen Marital Status:   Intimate Partner Violence:   . Fear of Current or Ex-Partner:   . Emotionally Abused:   Marland Kitchen Physically Abused:   . Sexually Abused:     Outpatient Medications Prior to Visit  Medication Sig Dispense Refill  . amLODipine (NORVASC) 10 MG tablet Take 1 tablet (10 mg total) by mouth daily. 90 tablet 1  . hydrochlorothiazide (HYDRODIURIL) 25 MG tablet Take 1 tablet (25 mg total) by mouth daily. Take on tablet in the morning. 90 tablet 1  . lansoprazole (PREVACID) 30 MG capsule Take 1 capsule (30 mg total) by mouth daily at 12 noon. 90 capsule 1  . pravastatin (PRAVACHOL) 20 MG tablet Take 1 tablet (20 mg total) by mouth daily. 90 tablet 3  . sitaGLIPtin-metformin (JANUMET) 50-1000 MG  tablet Take 1 tablet by mouth 2 (two) times daily with a meal. 180 tablet 1  . Blood Glucose Monitoring Suppl (TRUE METRIX METER) w/Device KIT 1 kit by Does not apply route 3 (three) times daily as needed. 1 kit 0  . glipiZIDE (GLUCOTROL) 10 MG tablet Take 1 tablet (10 mg total) by mouth 2 (two) times daily before a meal. (Patient not taking: Reported on 06/11/2019) 60 tablet 3   No facility-administered medications prior to visit.    Allergies  Allergen Reactions  . Aspirin Anaphylaxis  . Cyclobenzaprine Anaphylaxis  . Naproxen Sodium Anaphylaxis  . Zithromax [Azithromycin Dihydrate] Anaphylaxis    ROS Review of Systems  All other systems reviewed and are negative.     Objective:    Physical Exam  Constitutional: She is oriented to person, place, and time. She appears well-developed and well-nourished.  Cardiovascular:  Normal rate and regular rhythm.  Pulmonary/Chest: Effort normal and breath sounds normal.  Abdominal: Bowel sounds are normal.  Musculoskeletal:        General: Normal range of motion.     Cervical back: Normal range of motion and neck supple.  Neurological: She is alert and oriented to person, place, and time.  Skin: Skin is warm and dry.  Psychiatric: She has a normal mood and affect. Her behavior is normal. Judgment and thought content normal.    BP 120/80 (BP Location: Left Arm, Patient Position: Sitting, Cuff Size: Large)   Pulse 71   Temp (!) 97.5 F (36.4 C)   Resp 16   Ht 5' 8"  (1.727 m)   Wt 208 lb (94.3 kg)   LMP 06/29/2010   SpO2 99%   BMI 31.63 kg/m  Wt Readings from Last 3 Encounters:  08/13/19 208 lb (94.3 kg)  05/21/19 226 lb 9.6 oz (102.8 kg)  02/18/19 236 lb 6.4 oz (107.2 kg)     Health Maintenance Due  Topic Date Due  . PAP SMEAR-Modifier  Never done  . MAMMOGRAM  07/23/2014    There are no preventive care reminders to display for this patient.  Lab Results  Component Value Date   TSH 1.660 07/29/2017   Lab Results  Component Value Date   WBC 4.7 02/20/2019   HGB 15.0 02/20/2019   HCT 45.8 02/20/2019   MCV 89 02/20/2019   PLT 307 02/20/2019   Lab Results  Component Value Date   NA 138 02/20/2019   K 4.2 02/20/2019   CO2 24 02/20/2019   GLUCOSE 204 (H) 02/20/2019   BUN 11 02/20/2019   CREATININE 0.93 02/20/2019   BILITOT 0.5 02/20/2019   ALKPHOS 212 (H) 02/20/2019   AST 84 (H) 02/20/2019   ALT 81 (H) 02/20/2019   PROT 7.4 02/20/2019   ALBUMIN 4.4 02/20/2019   CALCIUM 9.9 02/20/2019   ANIONGAP 10 07/13/2016   Lab Results  Component Value Date   CHOL 199 02/20/2019   Lab Results  Component Value Date   HDL 48 02/20/2019   Lab Results  Component Value Date   LDLCALC 131 (H) 02/20/2019   Lab Results  Component Value Date   TRIG 111 02/20/2019   Lab Results  Component Value Date   CHOLHDL 4.1 02/20/2019   Lab Results   Component Value Date   HGBA1C 6.1 (A) 08/13/2019      Assessment & Plan:   Problem List Items Addressed This Visit    None    Visit Diagnoses    Type 2 diabetes mellitus without complication,  without long-term current use of insulin (HCC)    -  Primary   Relevant Orders   HgB A1c (Completed)   Glucose (CBG) (Completed)      No orders of the defined types were placed in this encounter.   Follow-up: Return in about 6 months (around 02/12/2020) for DM/HTN/Lipids.   1.  Rx change discontinued glucotrol and continue Janumet 50/1000 twice daily 2.  Education: Reviewed 'ABCs' of diabetes management (respective goals in parentheses):  A1C (<7), blood pressure (<130/80), and cholesterol (LDL <100). 3. Discussed pathophysiology of DM; difference between Prediabetes and type 2 DM. 4. CHO counting diet discussed.  Reviewed CHO amount in various foods and how to read nutrition labels.  Discussed recommended serving sizes.  5.  Recommend check BG none  6.  Recommended increase physical activity - goal is 150 minutes per week   Kerin Perna, NP

## 2019-08-13 NOTE — Progress Notes (Signed)
Follow up DM-  -Fasting blood sugar - 112 - A1C- 6.1

## 2019-08-13 NOTE — Patient Instructions (Signed)
Jaw Dislocation  A jaw dislocation is when the joint that connects the jaw bones (temporomandibular joint) moves out of place (gets dislocated). This can be very painful. Your jaw must be moved back into place by your doctor (manual reduction). What are the causes? Common causes of this condition include:  A hard, direct hit or injury (trauma) to the jaw.  Opening the mouth too wide. This can be caused by: ? Eating. ? Yawning. ? Throwing up (vomiting). ? Having a dental procedure. ? Receiving medicine by mouth to make you fall asleep (general anesthetic). What increases the risk? You are more likely to get this condition if:  Your jaw has moved out of place in the past.  You play contact sports.  You have a jaw that is loose or can move beyond the normal range. What are the signs or symptoms? Symptoms of this condition may include:  Very bad pain.  Not being able to move your jaw or close your mouth all the way.  Feeling that your teeth do not line up as they normally do when you bite.  Drooling.  Trouble speaking or swallowing. How is this treated? The most common treatment for this condition is to have your doctor move your jaw back into place. Your doctor may then wrap a bandage around your head and jaw. This will help to hold your jaw in place while it heals. Your doctor may also recommend:  Medicines to reduce pain and swelling.  Medicines to relax your muscles.  A neck brace to support your jaw. Follow these instructions at home: Managing pain, stiffness, and swelling   If told, put ice on the injured area: ? Put ice in a plastic bag. ? Place a towel between your skin and the bag. ? Leave the ice on for 20 minutes, 2-3 times a day. If you have a neck brace or jaw bandage:  Wear the brace or bandage as told by your doctor. Remove it only as told by your doctor.  Keep the brace or bandage clean.  If the brace or bandage is not waterproof: ? Do not let it  get wet. ? Cover it with a watertight covering when you take a bath or shower. Eating and drinking  Follow instructions from your doctor about what you can eat and drink while your jaw is healing. You may be asked to: ? Eat soft foods. ? Eat liquid food. ? Eat food that has been cut into small pieces. Activity  Do not open your mouth wide until your doctor says that you can.  Rest your jaw as told by your doctor.  Avoid activities that are like the one that caused your injury.  When you sneeze or yawn, put a hand under your jaw. This will keep your mouth from opening too wide. General instructions  Take over-the-counter and prescription medicines only as told by your doctor.  Do not take baths, swim, or use a hot tub until your doctor approves. Ask your doctor if you may take showers. You may only be allowed to take sponge baths.  Keep all follow-up visits as told by your doctor. This is important. Contact a doctor if:  You have pain that gets worse, and medicines do not help your pain. Get help right away if:  Your jaw moves out of place again.  You have trouble breathing. Summary  A jaw dislocation is when the joint that connects the jaw bones (temporomandibular joint) moves out of place.  Jaw dislocation can be very painful.  The most common treatment for this condition is for your jaw to be moved back into place by your doctor (manual reduction). This information is not intended to replace advice given to you by your health care provider. Make sure you discuss any questions you have with your health care provider. Document Revised: 02/12/2018 Document Reviewed: 02/12/2018 Elsevier Patient Education  [place].

## 2019-08-31 MED FILL — PRAVASTATIN SODIUM 20 MG TA: 20 | 30 days supply | Qty: 30 | Fill #3

## 2019-08-31 MED FILL — JANUMET 50-1,000 MG TABLET: 50-1000 | 30 days supply | Qty: 60 | Fill #3

## 2019-08-31 MED FILL — HYDROCHLOROTHIAZIDE 25 MG T: 25 | 30 days supply | Qty: 30 | Fill #3

## 2019-08-31 MED FILL — AMLODIPINE BESYLATE 10 MG T: 10 | 30 days supply | Qty: 30 | Fill #3

## 2019-08-31 MED FILL — ?LANSOPRAZOLE DR 30MG CAPSU: 30 | 30 days supply | Qty: 30 | Fill #2

## 2020-02-12 ENCOUNTER — Ambulatory Visit (INDEPENDENT_AMBULATORY_CARE_PROVIDER_SITE_OTHER): Payer: No Typology Code available for payment source | Admitting: Primary Care

## 2020-02-22 ENCOUNTER — Ambulatory Visit (INDEPENDENT_AMBULATORY_CARE_PROVIDER_SITE_OTHER): Payer: No Typology Code available for payment source | Admitting: Primary Care

## 2020-03-31 ENCOUNTER — Other Ambulatory Visit (INDEPENDENT_AMBULATORY_CARE_PROVIDER_SITE_OTHER): Payer: Self-pay | Admitting: Primary Care

## 2020-03-31 ENCOUNTER — Ambulatory Visit (INDEPENDENT_AMBULATORY_CARE_PROVIDER_SITE_OTHER): Payer: No Typology Code available for payment source | Admitting: Primary Care

## 2020-03-31 ENCOUNTER — Other Ambulatory Visit: Payer: Self-pay

## 2020-03-31 ENCOUNTER — Encounter (INDEPENDENT_AMBULATORY_CARE_PROVIDER_SITE_OTHER): Payer: Self-pay | Admitting: Primary Care

## 2020-03-31 VITALS — BP 141/90 | HR 70 | Temp 97.5°F | Ht 68.0 in | Wt 209.2 lb

## 2020-03-31 DIAGNOSIS — E119 Type 2 diabetes mellitus without complications: Secondary | ICD-10-CM | POA: Diagnosis not present

## 2020-03-31 DIAGNOSIS — Z760 Encounter for issue of repeat prescription: Secondary | ICD-10-CM | POA: Diagnosis not present

## 2020-03-31 DIAGNOSIS — Z1231 Encounter for screening mammogram for malignant neoplasm of breast: Secondary | ICD-10-CM

## 2020-03-31 DIAGNOSIS — I10 Essential (primary) hypertension: Secondary | ICD-10-CM | POA: Diagnosis not present

## 2020-03-31 DIAGNOSIS — E7800 Pure hypercholesterolemia, unspecified: Secondary | ICD-10-CM

## 2020-03-31 DIAGNOSIS — Z23 Encounter for immunization: Secondary | ICD-10-CM | POA: Diagnosis not present

## 2020-03-31 MED ORDER — AMLODIPINE BESYLATE 10 MG PO TABS: 10.0000 mg | ORAL_TABLET | Freq: Every day | ORAL | 1 refills | Status: DC

## 2020-03-31 MED ORDER — PRAVASTATIN SODIUM 20 MG PO TABS: 20.0000 mg | ORAL_TABLET | Freq: Every day | ORAL | 3 refills | Status: DC

## 2020-03-31 MED ORDER — LISINOPRIL-HYDROCHLOROTHIAZIDE 20-25 MG PO TABS: 1.0000 | ORAL_TABLET | Freq: Every day | ORAL | 3 refills | Status: DC

## 2020-03-31 MED ORDER — JANUMET 50-1000 MG PO TABS: 1.0000 | ORAL_TABLET | Freq: Two times a day (BID) | ORAL | 1 refills | Status: DC

## 2020-03-31 MED FILL — HYDROCHLOROTHIAZIDE 25 MG T: 25 | 30 days supply | Qty: 30 | Fill #4

## 2020-03-31 MED FILL — AMLODIPINE BESYLATE 10 MG T: 10 | 30 days supply | Qty: 30 | Fill #4

## 2020-03-31 MED FILL — LISINOPRIL-HYDROCHLOROTHIAZ: 20-25 | 30 days supply | Qty: 30 | Fill #0

## 2020-03-31 MED FILL — PRAVASTATIN SODIUM 20 MG TA: 20 | 30 days supply | Qty: 30 | Fill #0

## 2020-03-31 NOTE — Patient Instructions (Signed)

## 2020-03-31 NOTE — Progress Notes (Signed)
Established Patient Office Visit  Subjective:  Patient ID: Sheryl [name], female    DOB: 1966-02-15  Age: 54 y.o. MRN: [id number]  CC:  Chief Complaint  Patient presents with  . Hypertension    HPI [name] is a 54 year old female who presents for hypertension management.  Blood pressure remains elevated but she denies  shortness of breath, headaches, chest pain or lower extremity edema  Past Medical History:  Diagnosis Date  . H/O blood clots   . History of hysterectomy   . Hypertension     Past Surgical History:  Procedure Laterality Date  . ABDOMINAL HYSTERECTOMY      Family History  Problem Relation Age of Onset  . Stroke Mother   . Diabetes Mother   . Hypertension Mother   . Diabetes Other   . Hypertension Other   . Stroke Other   . Cancer Other   . Heart attack Other     Social History   Socioeconomic History  . Marital status: Widowed    Spouse name: Not on file  . Number of children: Not on file  . Years of education: Not on file  . Highest education level: Not on file  Occupational History  . Not on file  Tobacco Use  . Smoking status: Never Smoker  . Smokeless tobacco: Never Used  Substance and Sexual Activity  . Alcohol use: No  . Drug use: No  . Sexual activity: Yes    Birth control/protection: Surgical  Other Topics Concern  . Not on file  Social History Narrative  . Not on file   Social Determinants of Health   Financial Resource Strain:   . Difficulty of Paying Living Expenses: Not on file  Food Insecurity:   . Worried About Charity fundraiser in the Last Year: Not on file  . Ran Out of Food in the Last Year: Not on file  Transportation Needs:   . Lack of Transportation (Medical): Not on file  . Lack of Transportation (Non-Medical): Not on file  Physical Activity:   . Days of Exercise per Week: Not on file  . Minutes of Exercise per Session: Not on file  Stress:   . Feeling of Stress : Not on file  Social  Connections:   . Frequency of Communication with Friends and Family: Not on file  . Frequency of Social Gatherings with Friends and Family: Not on file  . Attends Religious Services: Not on file  . Active Member of Clubs or Organizations: Not on file  . Attends Archivist Meetings: Not on file  . Marital Status: Not on file  Intimate Partner Violence:   . Fear of Current or Ex-Partner: Not on file  . Emotionally Abused: Not on file  . Physically Abused: Not on file  . Sexually Abused: Not on file    Outpatient Medications Prior to Visit  Medication Sig Dispense Refill  . Blood Glucose Monitoring Suppl (TRUE METRIX METER) w/Device KIT 1 kit by Does not apply route 3 (three) times daily as needed. (Patient not taking: Reported on 03/31/2020) 1 kit 0  . lansoprazole (PREVACID) 30 MG capsule Take 1 capsule (30 mg total) by mouth daily at 12 noon. (Patient not taking: Reported on 03/31/2020) 90 capsule 1  . amLODipine (NORVASC) 10 MG tablet Take 1 tablet (10 mg total) by mouth daily. (Patient not taking: Reported on 03/31/2020) 90 tablet 1  . hydrochlorothiazide (HYDRODIURIL) 25 MG tablet Take 1 tablet (  25 mg total) by mouth daily. Take on tablet in the morning. (Patient not taking: Reported on 03/31/2020) 90 tablet 1  . pravastatin (PRAVACHOL) 20 MG tablet Take 1 tablet (20 mg total) by mouth daily. (Patient not taking: Reported on 03/31/2020) 90 tablet 3  . sitaGLIPtin-metformin (JANUMET) 50-1000 MG tablet Take 1 tablet by mouth 2 (two) times daily with a meal. (Patient not taking: Reported on 03/31/2020) 180 tablet 1   No facility-administered medications prior to visit.    Allergies  Allergen Reactions  . Aspirin Anaphylaxis  . Cyclobenzaprine Anaphylaxis  . Naproxen Sodium Anaphylaxis  . Zithromax [Azithromycin Dihydrate] Anaphylaxis    ROS Review of Systems  All other systems reviewed and are negative.     Objective:    Physical Exam Vitals reviewed.  Constitutional:       Appearance: She is obese.  HENT:     Head: Normocephalic.     Right Ear: Tympanic membrane normal.     Left Ear: Tympanic membrane normal.     Nose: Nose normal.  Eyes:     Extraocular Movements: Extraocular movements intact.     Pupils: Pupils are equal, round, and reactive to light.  Cardiovascular:     Rate and Rhythm: Normal rate and regular rhythm.  Pulmonary:     Effort: Pulmonary effort is normal.     Breath sounds: Normal breath sounds.  Abdominal:     General: Bowel sounds are normal. There is distension.     Palpations: Abdomen is soft.  Musculoskeletal:        General: Normal range of motion.     Cervical back: Normal range of motion and neck supple.  Skin:    General: Skin is warm and dry.  Neurological:     Mental Status: She is alert and oriented to person, place, and time.  Psychiatric:        Mood and Affect: Mood normal.        Behavior: Behavior normal.        Thought Content: Thought content normal.        Judgment: Judgment normal.     BP (!) 141/90 (BP Location: Right Arm, Patient Position: Sitting, Cuff Size: Normal)   Pulse 70   Temp (!) 97.5 F (36.4 C) (Temporal)   Ht 5' 8"  (1.727 m)   Wt 209 lb 3.2 oz (94.9 kg)   LMP 06/29/2010   SpO2 99%   BMI 31.81 kg/m  Wt Readings from Last 3 Encounters:  03/31/20 209 lb 3.2 oz (94.9 kg)  08/13/19 208 lb (94.3 kg)  05/21/19 226 lb 9.6 oz (102.8 kg)     Health Maintenance Due  Topic Date Due  . PAP SMEAR-Modifier  Never done  . MAMMOGRAM  07/23/2014    There are no preventive care reminders to display for this patient.  Lab Results  Component Value Date   TSH 1.660 07/29/2017   Lab Results  Component Value Date   WBC 3.9 03/31/2020   HGB 14.5 03/31/2020   HCT 44.3 03/31/2020   MCV 88 03/31/2020   PLT 288 03/31/2020   Lab Results  Component Value Date   NA 141 03/31/2020   K 4.1 03/31/2020   CO2 26 03/31/2020   GLUCOSE 97 03/31/2020   BUN 13 03/31/2020   CREATININE 0.73  03/31/2020   BILITOT 0.7 03/31/2020   ALKPHOS 110 03/31/2020   AST 45 (H) 03/31/2020   ALT 42 (H) 03/31/2020   PROT 7.0 03/31/2020  ALBUMIN 4.3 03/31/2020   CALCIUM 9.6 03/31/2020   ANIONGAP 10 07/13/2016   Lab Results  Component Value Date   CHOL 181 03/31/2020   Lab Results  Component Value Date   HDL 47 03/31/2020   Lab Results  Component Value Date   LDLCALC 119 (H) 03/31/2020   Lab Results  Component Value Date   TRIG 78 03/31/2020   Lab Results  Component Value Date   CHOLHDL 3.9 03/31/2020   Lab Results  Component Value Date   HGBA1C 6.1 (A) 08/13/2019      Assessment & Plan:  Sheryl [name] was seen today for hypertension.  Diagnoses and all orders for this visit:  Encounter for screening mammogram for malignant neoplasm of breast -     MM Digital Diagnostic Bilat; Future  Hypertension, unspecified type -     CMP14+EGFR -     amLODipine (NORVASC) 10 MG tablet; Take 1 tablet (10 mg total) by mouth daily. -     lisinopril-hydrochlorothiazide (ZESTORETIC) 20-25 MG tablet; Take 1 tablet by mouth daily.  Type 2 diabetes mellitus without complication, without long-term current use of insulin  Unable to collect A1C POCT after review of labs will determine medication for tx -     CBC with Differential -     pravastatin (PRAVACHOL) 20 MG tablet; Take 1 tablet (20 mg total) by mouth daily. -     HgB A1c -     Hemoglobin A1c  Encounter for medication refill -     sitaGLIPtin-metformin (JANUMET) 50-1000 MG tablet; Take 1 tablet by mouth 2 (two) times daily with a meal.  Elevated LDL cholesterol level ADA guidelines recommend statin tx. Decrease your fatty foods, red meat, cheese, milk and increase fiber like whole grains and veggies..  -     Lipid Panel  Need for immunization against influenza -     Flu Vaccine QUAD 36+ mos IM  Medication refill -     pravastatin (PRAVACHOL) 20 MG tablet; Take 1 tablet (20 mg total) by mouth daily. -     amLODipine (NORVASC)  10 MG tablet; Take 1 tablet (10 mg total) by mouth daily. -     sitaGLIPtin-metformin (JANUMET) 50-1000 MG tablet; Take 1 tablet by mouth 2 (two) times daily with a meal. -     lisinopril-hydrochlorothiazide (ZESTORETIC) 20-25 MG tablet; Take 1 tablet by mouth daily.    Meds ordered this encounter  Medications  . pravastatin (PRAVACHOL) 20 MG tablet    Sig: Take 1 tablet (20 mg total) by mouth daily.    Dispense:  90 tablet    Refill:  3  . amLODipine (NORVASC) 10 MG tablet    Sig: Take 1 tablet (10 mg total) by mouth daily.    Dispense:  90 tablet    Refill:  1  . sitaGLIPtin-metformin (JANUMET) 50-1000 MG tablet    Sig: Take 1 tablet by mouth 2 (two) times daily with a meal.    Dispense:  180 tablet    Refill:  1  . lisinopril-hydrochlorothiazide (ZESTORETIC) 20-25 MG tablet    Sig: Take 1 tablet by mouth daily.    Dispense:  90 tablet    Refill:  3    Follow-up: Return in about 3 months (around 06/29/2020) for HTN.    Kerin Perna, NP

## 2020-04-01 LAB — CMP14+EGFR
ALT: 42 IU/L — ABNORMAL HIGH (ref 0–32)
AST: 45 IU/L — ABNORMAL HIGH (ref 0–40)
Albumin/Globulin Ratio: 1.6 (ref 1.2–2.2)
Albumin: 4.3 g/dL (ref 3.8–4.9)
Alkaline Phosphatase: 110 IU/L (ref 44–121)
BUN/Creatinine Ratio: 18 (ref 9–23)
BUN: 13 mg/dL (ref 6–24)
Bilirubin Total: 0.7 mg/dL (ref 0.0–1.2)
CO2: 26 mmol/L (ref 20–29)
Calcium: 9.6 mg/dL (ref 8.7–10.2)
Chloride: 105 mmol/L (ref 96–106)
Creatinine, Ser: 0.73 mg/dL (ref 0.57–1.00)
GFR calc Af Amer: 106 mL/min/{1.73_m2} (ref >59)
GFR calc non Af Amer: 92 mL/min/{1.73_m2} (ref >59)
Globulin, Total: 2.7 g/dL (ref 1.5–4.5)
Glucose: 97 mg/dL (ref 65–99)
Potassium: 4.1 mmol/L (ref 3.5–5.2)
Sodium: 141 mmol/L (ref 134–144)
Total Protein: 7 g/dL (ref 6.0–8.5)

## 2020-04-01 LAB — CBC WITH DIFFERENTIAL/PLATELET
Basophils Absolute: 0 10*3/uL (ref 0.0–0.2)
Basos: 1 %
EOS (ABSOLUTE): 0.1 10*3/uL (ref 0.0–0.4)
Eos: 2 %
Hematocrit: 44.3 % (ref 34.0–46.6)
Hemoglobin: 14.5 g/dL (ref 11.1–15.9)
Immature Grans (Abs): 0 10*3/uL (ref 0.0–0.1)
Immature Granulocytes: 0 %
Lymphocytes Absolute: 1.1 10*3/uL (ref 0.7–3.1)
Lymphs: 27 %
MCH: 28.9 pg (ref 26.6–33.0)
MCHC: 32.7 g/dL (ref 31.5–35.7)
MCV: 88 fL (ref 79–97)
Monocytes Absolute: 0.4 10*3/uL (ref 0.1–0.9)
Monocytes: 10 %
Neutrophils Absolute: 2.3 10*3/uL (ref 1.4–7.0)
Neutrophils: 60 %
Platelets: 288 10*3/uL (ref 150–450)
RBC: 5.02 x10E6/uL (ref 3.77–5.28)
RDW: 13.2 % (ref 11.7–15.4)
WBC: 3.9 10*3/uL (ref 3.4–10.8)

## 2020-04-01 LAB — LIPID PANEL
Chol/HDL Ratio: 3.9 ratio (ref 0.0–4.4)
Cholesterol, Total: 181 mg/dL (ref 100–199)
HDL: 47 mg/dL (ref >39)
LDL Chol Calc (NIH): 119 mg/dL — ABNORMAL HIGH (ref 0–99)
Triglycerides: 78 mg/dL (ref 0–149)
VLDL Cholesterol Cal: 15 mg/dL (ref 5–40)

## 2020-04-04 ENCOUNTER — Encounter (INDEPENDENT_AMBULATORY_CARE_PROVIDER_SITE_OTHER): Payer: Self-pay | Admitting: Primary Care

## 2020-05-13 ENCOUNTER — Ambulatory Visit (INDEPENDENT_AMBULATORY_CARE_PROVIDER_SITE_OTHER): Payer: No Typology Code available for payment source | Admitting: Primary Care

## 2020-05-13 MED FILL — HYDROCHLOROTHIAZIDE 25 MG T: 25 | 30 days supply | Qty: 30 | Fill #5

## 2020-05-13 MED FILL — AMLODIPINE BESYLATE 10 MG T: 10 | 30 days supply | Qty: 30 | Fill #5

## 2020-05-13 MED FILL — LANSOPRAZOLE DR 30 MG CAPSU: 30 | 30 days supply | Qty: 30 | Fill #3

## 2020-06-11 ENCOUNTER — Encounter (HOSPITAL_COMMUNITY): Payer: Self-pay

## 2020-06-11 ENCOUNTER — Emergency Department (HOSPITAL_COMMUNITY)
Admission: EM | Admit: 2020-06-11 | Discharge: 2020-06-12 | Disposition: A | Discharge status: Home / Self Care | Payer: 59 | Attending: Emergency Medicine | Admitting: Emergency Medicine

## 2020-06-11 DIAGNOSIS — S0990XA Unspecified injury of head, initial encounter: Secondary | ICD-10-CM | POA: Diagnosis present

## 2020-06-11 DIAGNOSIS — M549 Dorsalgia, unspecified: Secondary | ICD-10-CM | POA: Insufficient documentation

## 2020-06-11 DIAGNOSIS — Z5321 Procedure and treatment not carried out due to patient leaving prior to being seen by health care provider: Secondary | ICD-10-CM | POA: Insufficient documentation

## 2020-06-11 DIAGNOSIS — W01198A Fall on same level from slipping, tripping and stumbling with subsequent striking against other object, initial encounter: Secondary | ICD-10-CM | POA: Insufficient documentation

## 2020-06-11 DIAGNOSIS — S060X1A Concussion with loss of consciousness of 30 minutes or less, initial encounter: Secondary | ICD-10-CM | POA: Insufficient documentation

## 2020-06-11 DIAGNOSIS — S300XXA Contusion of lower back and pelvis, initial encounter: Secondary | ICD-10-CM | POA: Diagnosis not present

## 2020-06-11 DIAGNOSIS — Z79899 Other long term (current) drug therapy: Secondary | ICD-10-CM | POA: Diagnosis not present

## 2020-06-11 DIAGNOSIS — Y92331 Roller skating rink as the place of occurrence of the external cause: Secondary | ICD-10-CM | POA: Diagnosis not present

## 2020-06-11 DIAGNOSIS — E876 Hypokalemia: Secondary | ICD-10-CM | POA: Insufficient documentation

## 2020-06-11 DIAGNOSIS — R110 Nausea: Secondary | ICD-10-CM | POA: Insufficient documentation

## 2020-06-11 DIAGNOSIS — W500XXA Accidental hit or strike by another person, initial encounter: Secondary | ICD-10-CM | POA: Insufficient documentation

## 2020-06-11 DIAGNOSIS — Y92330 Ice skating rink (indoor) (outdoor) as the place of occurrence of the external cause: Secondary | ICD-10-CM | POA: Insufficient documentation

## 2020-06-11 DIAGNOSIS — I10 Essential (primary) hypertension: Secondary | ICD-10-CM | POA: Insufficient documentation

## 2020-06-11 NOTE — ED Triage Notes (Addendum)
Pt comes via [facility] from [place], fell hit head, no LOC, some nausea, also having upper back pain

## 2020-06-12 ENCOUNTER — Encounter (HOSPITAL_COMMUNITY): Payer: Self-pay | Admitting: *Deleted

## 2020-06-12 ENCOUNTER — Emergency Department (HOSPITAL_COMMUNITY): Payer: 59

## 2020-06-12 ENCOUNTER — Emergency Department (HOSPITAL_COMMUNITY)
Admission: EM | Admit: 2020-06-12 | Discharge: 2020-06-12 | Disposition: A | Discharge status: Home / Self Care | Payer: 59 | Source: Home / Self Care | Attending: Emergency Medicine | Admitting: Emergency Medicine

## 2020-06-12 ENCOUNTER — Other Ambulatory Visit: Payer: Self-pay

## 2020-06-12 DIAGNOSIS — E876 Hypokalemia: Secondary | ICD-10-CM

## 2020-06-12 DIAGNOSIS — S060X1A Concussion with loss of consciousness of 30 minutes or less, initial encounter: Secondary | ICD-10-CM

## 2020-06-12 DIAGNOSIS — S300XXA Contusion of lower back and pelvis, initial encounter: Secondary | ICD-10-CM

## 2020-06-12 LAB — CBC WITH DIFFERENTIAL/PLATELET
Abs Immature Granulocytes: 0.02 10*3/uL (ref 0.00–0.07)
Basophils Absolute: 0 10*3/uL (ref 0.0–0.1)
Basophils Relative: 1 %
Eosinophils Absolute: 0.1 10*3/uL (ref 0.0–0.5)
Eosinophils Relative: 2 %
HCT: 40.8 % (ref 36.0–46.0)
Hemoglobin: 14.3 g/dL (ref 12.0–15.0)
Immature Granulocytes: 0 %
Lymphocytes Relative: 21 %
Lymphs Abs: 1.2 10*3/uL (ref 0.7–4.0)
MCH: 30 pg (ref 26.0–34.0)
MCHC: 35 g/dL (ref 30.0–36.0)
MCV: 85.5 fL (ref 80.0–100.0)
Monocytes Absolute: 0.5 10*3/uL (ref 0.1–1.0)
Monocytes Relative: 8 %
Neutro Abs: 3.7 10*3/uL (ref 1.7–7.7)
Neutrophils Relative %: 68 %
Platelets: 277 10*3/uL (ref 150–400)
RBC: 4.77 MIL/uL (ref 3.87–5.11)
RDW: 13.2 % (ref 11.5–15.5)
WBC: 5.5 10*3/uL (ref 4.0–10.5)
nRBC: 0 % (ref 0.0–0.2)

## 2020-06-12 LAB — TROPONIN I (HIGH SENSITIVITY): Troponin I (High Sensitivity): 5 ng/L (ref <18)

## 2020-06-12 LAB — BASIC METABOLIC PANEL
Anion gap: 11 (ref 5–15)
BUN: 12 mg/dL (ref 6–20)
CO2: 26 mmol/L (ref 22–32)
Calcium: 9.3 mg/dL (ref 8.9–10.3)
Chloride: 103 mmol/L (ref 98–111)
Creatinine, Ser: 0.6 mg/dL (ref 0.44–1.00)
GFR, Estimated: >60 mL/min (ref >60)
Glucose, Bld: 137 mg/dL — ABNORMAL HIGH (ref 70–99)
Potassium: 3.1 mmol/L — ABNORMAL LOW (ref 3.5–5.1)
Sodium: 140 mmol/L (ref 135–145)

## 2020-06-12 LAB — CBG MONITORING, ED: Glucose-Capillary: 141 mg/dL — ABNORMAL HIGH (ref 70–99)

## 2020-06-12 MED ORDER — MECLIZINE HCL 25 MG PO TABS: 25.0000 mg | ORAL_TABLET | Freq: Three times a day (TID) | ORAL | 0 refills | Status: DC | PRN

## 2020-06-12 MED ORDER — POTASSIUM CHLORIDE CRYS ER 20 MEQ PO TBCR: 20.0000 meq | EXTENDED_RELEASE_TABLET | Freq: Every day | ORAL | 0 refills | Status: DC

## 2020-06-12 MED ORDER — ONDANSETRON 4 MG PO TBDP
4.0000 mg | ORAL_TABLET | Freq: Once | ORAL | Status: AC
  Administered 2020-06-12: 4 mg via ORAL
  Filled 2020-06-12: qty 1

## 2020-06-12 MED ORDER — HYDROCODONE-ACETAMINOPHEN 5-325 MG PO TABS
1.0000 | ORAL_TABLET | Freq: Once | ORAL | Status: AC
  Administered 2020-06-12: 1 via ORAL
  Filled 2020-06-12: qty 1

## 2020-06-12 MED ORDER — OXYCODONE HCL 5 MG PO TABS: 2.5000 mg | ORAL_TABLET | Freq: Four times a day (QID) | ORAL | 0 refills | Status: DC | PRN

## 2020-06-12 NOTE — ED Triage Notes (Signed)
Patient states she was standing up last pm and has a syncopal episode hitting her head on the floor carpet/concrete, states she came to the ED however after waiting left . States once she got home was thing more clear and decided she need to be seen. Currently alert oriented . C/o headache and lower back pain .

## 2020-06-12 NOTE — ED Notes (Signed)
Pt stated that she is leaving. Pt told we recommend her to stay, but she should come back if any of her symptoms are worsening. Pt verbalized understanding and her family member took her out to the car.
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
## 2020-06-12 NOTE — Discharge Instructions (Addendum)
You may use over-the-counter Colace stool softener 1 tablet twice a daily.  Use ice over the affected area of your bottom.  You can do this 20 minutes at a time.  Or longer if your skin is protected by a barrier such as a towel. Get help right away if: You have: A severe headache that is not helped by medicine. Trouble walking or weakness in your arms and legs. Clear or bloody fluid coming from your nose or ears. Changes in your vision. A seizure. Increased confusion or irritability. Your symptoms get worse. You are sleepier than normal and have trouble staying awake. You lose your balance. Your pupils change size. Your speech is slurred. Your dizziness gets worse. You have uncontrolled vomiting.

## 2020-06-17 ENCOUNTER — Other Ambulatory Visit (INDEPENDENT_AMBULATORY_CARE_PROVIDER_SITE_OTHER): Payer: Self-pay | Admitting: Primary Care

## 2020-06-17 DIAGNOSIS — K219 Gastro-esophageal reflux disease without esophagitis: Secondary | ICD-10-CM

## 2020-06-17 DIAGNOSIS — I10 Essential (primary) hypertension: Secondary | ICD-10-CM

## 2020-06-17 MED FILL — LANSOPRAZOLE DR 30 MG CAPSU: 30 | 30 days supply | Qty: 30 | Fill #0

## 2020-06-17 MED FILL — AMLODIPINE BESYLATE 10 MG T: 10 | 30 days supply | Qty: 30 | Fill #0

## 2020-06-22 ENCOUNTER — Encounter (INDEPENDENT_AMBULATORY_CARE_PROVIDER_SITE_OTHER): Payer: Self-pay | Admitting: Primary Care

## 2020-06-22 ENCOUNTER — Other Ambulatory Visit: Payer: Self-pay

## 2020-06-22 ENCOUNTER — Ambulatory Visit (INDEPENDENT_AMBULATORY_CARE_PROVIDER_SITE_OTHER): Payer: 59 | Admitting: Primary Care

## 2020-06-22 VITALS — BP 131/85 | HR 68 | Temp 97.3°F | Ht 68.0 in | Wt 215.4 lb

## 2020-06-22 DIAGNOSIS — Z09 Encounter for follow-up examination after completed treatment for conditions other than malignant neoplasm: Secondary | ICD-10-CM
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
## 2020-06-24 ENCOUNTER — Other Ambulatory Visit (INDEPENDENT_AMBULATORY_CARE_PROVIDER_SITE_OTHER): Payer: Self-pay | Admitting: Primary Care

## 2020-06-24 DIAGNOSIS — I10 Essential (primary) hypertension: Secondary | ICD-10-CM

## 2020-06-24 MED FILL — AMLODIPINE BESYLATE 10 MG T: 10 | 30 days supply | Qty: 30 | Fill #0

## 2020-06-24 MED FILL — LANSOPRAZOLE DR 30 MG CAPSU: 30 | 30 days supply | Qty: 30 | Fill #0

## 2020-06-28 ENCOUNTER — Other Ambulatory Visit (INDEPENDENT_AMBULATORY_CARE_PROVIDER_SITE_OTHER): Payer: Self-pay | Admitting: Primary Care

## 2020-06-28 DIAGNOSIS — I10 Essential (primary) hypertension: Secondary | ICD-10-CM

## 2020-06-29 ENCOUNTER — Telehealth (INDEPENDENT_AMBULATORY_CARE_PROVIDER_SITE_OTHER): Payer: Self-pay | Admitting: Primary Care

## 2020-06-29 NOTE — Telephone Encounter (Signed)
Pt called in to follow up on refill request for medication. Pt is requesting a refill for lisinopril-hydrochlorothiazide (ZESTORETIC) 20-25 MG tablet. Showing Rx was denied.     Pharmacy:  [place], [place] [facility] Phone:  [phone]  Fax:  [phone]

## 2020-06-30 NOTE — Telephone Encounter (Signed)
Pt is calling to ask why her lisinopril hydrochlogothizaide was denied? Is an appt needed?Please advise CB- [phone]

## 2020-07-01 MED FILL — LISINOPRIL-HYDROCHLOROTHIAZ: 20-25 | 30 days supply | Qty: 30 | Fill #0

## 2020-07-01 NOTE — Telephone Encounter (Signed)
Called patient and informed her that medication will be ready today after 2 pm.

## 2020-07-30 ENCOUNTER — Other Ambulatory Visit: Payer: Self-pay

## 2020-08-02 ENCOUNTER — Other Ambulatory Visit: Payer: Self-pay

## 2020-08-02 MED FILL — Amlodipine Besylate Tab 10 MG (Base Equivalent): ORAL | 90 days supply | Qty: 90 | Fill #0 | Status: AC

## 2020-08-02 MED FILL — Lisinopril & Hydrochlorothiazide Tab 20-25 MG: ORAL | 90 days supply | Qty: 90 | Fill #0 | Status: AC

## 2020-08-04 ENCOUNTER — Other Ambulatory Visit: Payer: Self-pay

## 2020-09-29 ENCOUNTER — Other Ambulatory Visit: Payer: Self-pay

## 2020-09-29 MED FILL — Lansoprazole Cap Delayed Release 30 MG: ORAL | 30 days supply | Qty: 30 | Fill #0 | Status: AC

## 2020-09-30 ENCOUNTER — Other Ambulatory Visit: Payer: Self-pay

## 2020-10-13 ENCOUNTER — Other Ambulatory Visit: Payer: Self-pay

## 2020-10-13 ENCOUNTER — Ambulatory Visit
Admission: RE | Admit: 2020-10-13 | Discharge: 2020-10-13 | Disposition: A | Discharge status: Home / Self Care | Payer: 59 | Source: Ambulatory Visit | Attending: Primary Care | Admitting: Primary Care

## 2020-10-13 DIAGNOSIS — Z1231 Encounter for screening mammogram for malignant neoplasm of breast: Secondary | ICD-10-CM

## 2020-10-14 ENCOUNTER — Ambulatory Visit: Payer: No Typology Code available for payment source

## 2020-11-07 ENCOUNTER — Other Ambulatory Visit: Payer: Self-pay

## 2020-11-07 MED FILL — Lisinopril & Hydrochlorothiazide Tab 20-25 MG: ORAL | 90 days supply | Qty: 90 | Fill #1 | Status: AC

## 2020-11-07 MED FILL — Amlodipine Besylate Tab 10 MG (Base Equivalent): ORAL | 60 days supply | Qty: 60 | Fill #1 | Status: AC

## 2020-11-21 ENCOUNTER — Ambulatory Visit (INDEPENDENT_AMBULATORY_CARE_PROVIDER_SITE_OTHER): Payer: 59 | Admitting: Obstetrics & Gynecology

## 2020-11-21 ENCOUNTER — Encounter: Payer: Self-pay | Admitting: Obstetrics & Gynecology

## 2020-11-21 ENCOUNTER — Other Ambulatory Visit: Payer: Self-pay

## 2020-11-21 VITALS — BP 108/76 | Ht 68.0 in | Wt 226.0 lb

## 2020-11-21 DIAGNOSIS — Z01419 Encounter for gynecological examination (general) (routine) without abnormal findings: Secondary | ICD-10-CM

## 2020-11-21 DIAGNOSIS — Z90710 Acquired absence of both cervix and uterus: Secondary | ICD-10-CM | POA: Diagnosis not present

## 2020-11-21 DIAGNOSIS — D229 Melanocytic nevi, unspecified: Secondary | ICD-10-CM

## 2020-11-21 DIAGNOSIS — Z780 Asymptomatic menopausal state: Secondary | ICD-10-CM | POA: Diagnosis not present

## 2020-11-21 DIAGNOSIS — Z6834 Body mass index (BMI) 34.0-34.9, adult: Secondary | ICD-10-CM

## 2020-11-21 DIAGNOSIS — E6609 Other obesity due to excess calories: Secondary | ICD-10-CM

## 2020-11-21 NOTE — Progress Notes (Signed)
Sheryl [name] June 19, 1965 [id number]   History:    55 y.o. [id number] Widowed  RP:  New patient presenting for annual gyn exam   HPI: S/P Total Hysterectomy for Fibroids with Dr Garwin Brothers.  Ovaries preserved.  No pelvic pain.  Abstinent.  Urine/BMs normal.  Breasts normal.  BMI 34.36.  Fam NP for Health labs.  Due for Colonoscopy, will schedule with Gastro.  Would like to see Dermato for moles.  Past medical history,surgical history, family history and social history were all reviewed and documented in the EPIC chart.  Gynecologic History Patient's last menstrual period was 06/29/2010.  Obstetric History OB History  Gravida Para Term Preterm AB Living  3 2     1 2   SAB IAB Ectopic Multiple Live Births    1          # Outcome Date GA Lbr Len/2nd Weight Sex Delivery Anes PTL Lv  3 IAB           2 Para           1 Para              ROS: A ROS was performed and pertinent positives and negatives are included in the history.  GENERAL: No fevers or chills. HEENT: No change in vision, no earache, sore throat or sinus congestion. NECK: No pain or stiffness. CARDIOVASCULAR: No chest pain or pressure. No palpitations. PULMONARY: No shortness of breath, cough or wheeze. GASTROINTESTINAL: No abdominal pain, nausea, vomiting or diarrhea, melena or bright red blood per rectum. GENITOURINARY: No urinary frequency, urgency, hesitancy or dysuria. MUSCULOSKELETAL: No joint or muscle pain, no back pain, no recent trauma. DERMATOLOGIC: No rash, no itching, no lesions. ENDOCRINE: No polyuria, polydipsia, no heat or cold intolerance. No recent change in weight. HEMATOLOGICAL: No anemia or easy bruising or bleeding. NEUROLOGIC: No headache, seizures, numbness, tingling or weakness. PSYCHIATRIC: No depression, no loss of interest in normal activity or change in sleep pattern.     Exam:   BP 108/76   Ht 5\' 8"  (1.727 m)   Wt 226 lb (102.5 kg)   LMP 06/29/2010   BMI 34.36 kg/m   Body mass index is  34.36 kg/m.  General appearance : Well developed well nourished female. No acute distress HEENT: Eyes: no retinal hemorrhage or exudates,  Neck supple, trachea midline, no carotid bruits, no thyroidmegaly Lungs: Clear to auscultation, no rhonchi or wheezes, or rib retractions  Heart: Regular rate and rhythm, no murmurs or gallops Breast:Examined in sitting and supine position were symmetrical in appearance, no palpable masses or tenderness,  no skin retraction, no nipple inversion, no nipple discharge, no skin discoloration, no axillary or supraclavicular lymphadenopathy Abdomen: no palpable masses or tenderness, no rebound or guarding Extremities: no edema or skin discoloration or tenderness  Pelvic: Vulva: Normal             Vagina: No gross lesions or discharge  Cervix/Uterus absent  Adnexa  Without masses or tenderness  Anus: Normal   Assessment/Plan:  55 y.o. female for annual exam   1. Well female exam with routine gynecological exam Gynecologic exam status post total hysterectomy and menopause.  No indication for Pap test this year.  Breast exam normal.  Screening mammogram in June 2022 was negative.  We will schedule a colonoscopy with gastro  Health labs with family NP.  2. S/P total hysterectomy  3. Postmenopause Well on no hormone replacement therapy.  Vitamin D supplements, calcium intake of  1.5 g/day total and regular weightbearing physical activity is recommended.  4. Numerous skin moles Referred to dermatology.  5. Class 1 obesity due to excess calories with serious comorbidity and body mass index (BMI) of 34.0 to 34.9 in adult  Lower calorie/carb diet.  Aerobic activities 5 times a week and light weightlifting every 2 days.  Princess Bruins MD, 3:13 PM 11/21/2020

## 2020-11-22 ENCOUNTER — Encounter: Payer: Self-pay | Admitting: Obstetrics & Gynecology

## 2020-11-23 ENCOUNTER — Telehealth: Payer: Self-pay | Admitting: *Deleted

## 2020-11-23 NOTE — Telephone Encounter (Signed)
-----   Message from Princess Bruins, MD sent at 11/21/2020  3:31 PM EDT ----- Regarding: Refer to Dermato for moles Skin moles.

## 2020-11-23 NOTE — Telephone Encounter (Signed)
Office notes faxed Dermatology specialist [phone], they will call to schedule. Patient is aware of this as well.

## 2020-11-24 ENCOUNTER — Other Ambulatory Visit: Payer: Self-pay

## 2020-11-24 ENCOUNTER — Other Ambulatory Visit (INDEPENDENT_AMBULATORY_CARE_PROVIDER_SITE_OTHER): Payer: Self-pay | Admitting: Primary Care

## 2020-11-24 DIAGNOSIS — K219 Gastro-esophageal reflux disease without esophagitis: Secondary | ICD-10-CM

## 2020-11-24 MED ORDER — LANSOPRAZOLE 30 MG PO CPDR
DELAYED_RELEASE_CAPSULE | ORAL | 1 refills | Status: DC
  Filled 2020-11-24 – 2021-02-08 (×2): qty 30, 30d supply, fill #0
  Filled 2021-03-20: qty 30, 30d supply, fill #1

## 2020-12-01 ENCOUNTER — Other Ambulatory Visit: Payer: Self-pay

## 2020-12-20 ENCOUNTER — Encounter (INDEPENDENT_AMBULATORY_CARE_PROVIDER_SITE_OTHER): Payer: Self-pay | Admitting: Primary Care

## 2020-12-20 ENCOUNTER — Other Ambulatory Visit: Payer: Self-pay

## 2020-12-20 ENCOUNTER — Ambulatory Visit (INDEPENDENT_AMBULATORY_CARE_PROVIDER_SITE_OTHER): Payer: 59 | Admitting: Primary Care

## 2020-12-20 VITALS — BP 128/87 | HR 84 | Temp 97.5°F | Ht 68.0 in | Wt 232.6 lb

## 2020-12-20 DIAGNOSIS — Z23 Encounter for immunization: Secondary | ICD-10-CM | POA: Diagnosis not present

## 2020-12-20 DIAGNOSIS — R7303 Prediabetes: Secondary | ICD-10-CM | POA: Diagnosis not present

## 2020-12-20 DIAGNOSIS — E119 Type 2 diabetes mellitus without complications: Secondary | ICD-10-CM

## 2020-12-20 DIAGNOSIS — I10 Essential (primary) hypertension: Secondary | ICD-10-CM | POA: Diagnosis not present

## 2020-12-20 DIAGNOSIS — Z760 Encounter for issue of repeat prescription: Secondary | ICD-10-CM

## 2020-12-20 DIAGNOSIS — Z1211 Encounter for screening for malignant neoplasm of colon: Secondary | ICD-10-CM

## 2020-12-20 DIAGNOSIS — K219 Gastro-esophageal reflux disease without esophagitis: Secondary | ICD-10-CM | POA: Diagnosis not present

## 2020-12-20 LAB — POCT GLYCOSYLATED HEMOGLOBIN (HGB A1C): Hemoglobin A1C: 7.6 % — AB (ref 4.0–5.6)

## 2020-12-20 MED ORDER — GLIMEPIRIDE 2 MG PO TABS
2.0000 mg | ORAL_TABLET | Freq: Every day | ORAL | 3 refills | Status: DC
Start: 2020-12-20 — End: 2021-03-28
  Filled 2020-12-20: qty 30, 30d supply, fill #0

## 2020-12-20 MED ORDER — LISINOPRIL-HYDROCHLOROTHIAZIDE 20-25 MG PO TABS
1.0000 | ORAL_TABLET | Freq: Every day | ORAL | 3 refills | Status: DC
  Filled 2020-12-20 – 2021-02-08 (×2): qty 90, 90d supply, fill #0
  Filled 2021-05-11: qty 90, 90d supply, fill #1
  Filled 2021-05-11: qty 30, 30d supply, fill #0
  Filled 2021-06-20: qty 30, 30d supply, fill #1

## 2020-12-20 MED ORDER — AMLODIPINE BESYLATE 10 MG PO TABS
ORAL_TABLET | Freq: Every day | ORAL | 1 refills | Status: DC
  Filled 2020-12-20: qty 90, fill #0
  Filled 2021-02-08: qty 90, 90d supply, fill #0
  Filled 2021-05-11: qty 30, 30d supply, fill #0
  Filled 2021-05-11: qty 90, 90d supply, fill #1
  Filled 2021-06-20: qty 30, 30d supply, fill #1

## 2020-12-20 MED ORDER — METFORMIN HCL 1000 MG PO TABS
1000.0000 mg | ORAL_TABLET | Freq: Two times a day (BID) | ORAL | 1 refills | Status: DC
  Filled 2020-12-20 – 2021-03-02 (×2): qty 180, 90d supply, fill #0

## 2020-12-20 NOTE — Patient Instructions (Signed)
Zoster Vaccine, Recombinant injection What is this medication? ZOSTER VACCINE (ZOS ter vak SEEN) is a vaccine used to reduce the risk of getting shingles. This vaccine is not used to treat shingles or nerve pain fromshingles. This medicine may be used for other purposes; ask your health care provider orpharmacist if you have questions. COMMON BRAND NAME(S): [facility] What should I tell my care team before I take this medication? They need to know if you have any of these conditions: cancer immune system problems an unusual or allergic reaction to Zoster vaccine, other medications, foods, dyes, or preservatives pregnant or trying to get pregnant breast-feeding How should I use this medication? This vaccine is injected into a muscle. It is given by a health care provider. A copy of Vaccine Information Statements will be given before each vaccination. Be sure to read this information carefully each time. This sheet may changeoften. Talk to your health care provider about the use of this vaccine in children.This vaccine is not approved for use in children. Overdosage: If you think you have taken too much of this medicine contact apoison control center or emergency room at once. NOTE: This medicine is only for you. Do not share this medicine with others. What if I miss a dose? Keep appointments for follow-up (booster) doses. It is important not to miss your dose. Call your health care provider if you are unable to keep anappointment. What may interact with this medication? medicines that suppress your immune system medicines to treat cancer steroid medicines like prednisone or cortisone This list may not describe all possible interactions. Give your health care provider a list of all the medicines, herbs, non-prescription drugs, or dietary supplements you use. Also tell them if you smoke, drink alcohol, or use illegaldrugs. Some items may interact with your medicine. What should I watch for while  using this medication? Visit your health care provider regularly. This vaccine, like all vaccines, may not fully protect everyone. What side effects may I notice from receiving this medication? Side effects that you should report to your doctor or health care professionalas soon as possible: allergic reactions (skin rash, itching or hives; swelling of the face, lips, or tongue) trouble breathing Side effects that usually do not require medical attention (report these toyour doctor or health care professional if they continue or are bothersome): chills headache fever nausea pain, redness, or irritation at site where injected tiredness vomiting This list may not describe all possible side effects. Call your doctor for medical advice about side effects. You may report side effects to FDA at1-800-FDA-1088. Where should I keep my medication? This vaccine is only given by a health care provider. It will not be stored athome. NOTE: This sheet is a summary. It may not cover all possible information. If you have questions about this medicine, talk to your doctor, pharmacist, orhealth care provider.  2022 Elsevier/Gold Standard (2019-05-22 16:23:07) Tdap (Tetanus, Diphtheria, Pertussis) Vaccine: What You Need to Know 1. Why get vaccinated? Tdap vaccine can prevent tetanus, diphtheria, and pertussis. Diphtheria and pertussis spread from person to person. Tetanus enters the body through cuts or wounds. TETANUS (T) causes painful stiffening of the muscles. Tetanus can lead to serious health problems, including being unable to open the mouth, having trouble swallowing and breathing, or death. DIPHTHERIA (D) can lead to difficulty breathing, heart failure, paralysis, or death. PERTUSSIS (aP), also known as "whooping cough," can cause uncontrollable, violent coughing that makes it hard to breathe, eat, or drink. Pertussis can be extremely  serious especially in babies and young children, causing pneumonia,  convulsions, brain damage, or death. In teens and adults, it can cause weight loss, loss of bladder control, passing out, and rib fractures from severe coughing. 2. Tdap vaccine Tdap is only for children 7 years and older, adolescents, and adults.  Adolescents should receive a single dose of Tdap, preferably at age 98 or 71 years. Pregnant people should get a dose of Tdap during every pregnancy, preferably during the early part of the third trimester, to help protect the newborn from pertussis. Infants are most at risk for severe, life-threatening complications frompertussis. Adults who have never received Tdap should get a dose of Tdap. Also, adults should receive a booster dose of either Tdap or Td (a different vaccine that protects against tetanus and diphtheria but not pertussis) every 10 years, or after 5 years in the case of a severe or dirty wound or burn. Tdap may be given at the same time as other vaccines. 3. Talk with your health care provider Tell your vaccine provider if the person getting the vaccine: Has had an allergic reaction after a previous dose of any vaccine that protects against tetanus, diphtheria, or pertussis, or has any severe, life-threatening allergies Has had a coma, decreased level of consciousness, or prolonged seizures within 7 days after a previous dose of any pertussis vaccine (DTP, DTaP, or Tdap) Has seizures or another nervous system problem Has ever had Guillain-Barr Syndrome (also called "GBS") Has had severe pain or swelling after a previous dose of any vaccine that protects against tetanus or diphtheria In some cases, your health care provider may decide to postpone Tdapvaccination until a future visit. People with minor illnesses, such as a cold, may be vaccinated. People who are moderately or severely ill should usually wait until they recover beforegetting Tdap vaccine.  Your health care provider can give you more information. 4. Risks of a vaccine  reaction Pain, redness, or swelling where the shot was given, mild fever, headache, feeling tired, and nausea, vomiting, diarrhea, or stomachache sometimes happen after Tdap vaccination. People sometimes faint after medical procedures, including vaccination. Tellyour provider if you feel dizzy or have vision changes or ringing in the ears.  As with any medicine, there is a very remote chance of a vaccine causing asevere allergic reaction, other serious injury, or death. 5. What if there is a serious problem? An allergic reaction could occur after the vaccinated person leaves the clinic. If you see signs of a severe allergic reaction (hives, swelling of the face and throat, difficulty breathing, a fast heartbeat, dizziness, or weakness), call 9-1-1and get the person to the nearest hospital. For other signs that concern you, call your health care provider.  Adverse reactions should be reported to the Vaccine Adverse Event Reporting System (VAERS). Your health care provider will usually file this report, or you can do it yourself. Visit the VAERS website at www.vaers.SamedayNews.es or call (469) 209-4988. VAERS is only for reporting reactions, and VAERS staff members do not give medical advice. 6. The National Vaccine Injury Compensation Program The [employer] Vaccine Injury Compensation Program (VICP) is a federal program that was created to compensate people who may have been injured by certain vaccines. Claims regarding alleged injury or death due to vaccination have a time limit for filing, which may be as short as two years. Visit the VICP website at GoldCloset.com.ee or call 780 816 1279to learn about the program and about filing a claim. 7. How can I learn more? Ask your  health care provider. Call your local or state health department. Visit the website of the Food and Drug Administration (FDA) for vaccine package inserts and additional information at  TraderRating.uy. Contact the Centers for Disease Control and Prevention (CDC): Call 708-260-0692 (1-800-CDC-INFO) or Visit CDC's website at http://hunter.com/. Vaccine Information Statement Tdap (Tetanus, Diphtheria, Pertussis) Vaccine(12/04/2019) This information is not intended to replace advice given to you by your health care provider. Make sure you discuss any questions you have with your healthcare provider. Document Revised: 12/30/2019 Document Reviewed: 12/30/2019 Elsevier Patient Education  2022 [employer].

## 2020-12-20 NOTE — Progress Notes (Signed)
Sheryl [name] is a 55 y.o. female presents for hypertension evaluation, Denies shortness of breath, headaches, chest pain or lower extremity edema, sudden onset, vision changes, unilateral weakness, dizziness, paresthesias   Patient reports adherence with medications.  Dietary habits include: monitoring carbs, fatty foods and salt  Exercise habits include:walking Family / Social history: stroke and HTN    Past Medical History:  Diagnosis Date   Blood transfusion without reported diagnosis    had transfusion with hysterectomy   Fibroid    H/O blood clots    History of hysterectomy    fibroids and heavy cycles   Hypertension    Past Surgical History:  Procedure Laterality Date   ABDOMINAL HYSTERECTOMY     Allergies  Allergen Reactions   Aspirin Anaphylaxis   Cyclobenzaprine Anaphylaxis   Naproxen Sodium Anaphylaxis   Zithromax [Azithromycin Dihydrate] Anaphylaxis   Dilaudid [Hydromorphone] Nausea And Vomiting   Current Outpatient Medications on File Prior to Visit  Medication Sig Dispense Refill   lansoprazole (PREVACID) 30 MG capsule TAKE 1 CAPSULE (30 MG TOTAL) BY MOUTH DAILY AT 12 NOON. 30 capsule 1   Blood Glucose Monitoring Suppl (TRUE METRIX METER) w/Device KIT 1 kit by Does not apply route 3 (three) times daily as needed. (Patient not taking: Reported on 12/20/2020) 1 kit 0   [DISCONTINUED] hydrochlorothiazide (HYDRODIURIL) 25 MG tablet Take 1 tablet (25 mg total) by mouth daily. Take on tablet in the morning. (Patient not taking: Reported on 03/31/2020) 90 tablet 1   No current facility-administered medications on file prior to visit.   Social History   Socioeconomic History   Marital status: Widowed    Spouse name: Not on file   Number of children: Not on file   Years of education: Not on file   Highest education level: Not on file  Occupational History   Not on file  Tobacco Use   Smoking status: Never   Smokeless tobacco:  Never  Vaping Use   Vaping Use: Never used  Substance and Sexual Activity   Alcohol use: No   Drug use: No   Sexual activity: Yes    Birth control/protection: Surgical    Comment: Hyst  Other Topics Concern   Not on file  Social History Narrative   Not on file   Social Determinants of Health   Financial Resource Strain: Not on file  Food Insecurity: Not on file  Transportation Needs: Not on file  Physical Activity: Not on file  Stress: Not on file  Social Connections: Not on file  Intimate Partner Violence: Not on file   Family History  Problem Relation Age of Onset   Stroke Mother    Diabetes Mother    Hypertension Mother    Stroke Father    Heart attack Maternal Grandmother    Breast cancer Paternal Grandmother    Diabetes Other    Hypertension Other    Stroke Other    Cancer Other    Heart attack Other      OBJECTIVE:  Vitals:   12/20/20 0918  BP: 128/87  Pulse: 84  Temp: (!) 97.5 F (36.4 C)  TempSrc: Temporal  SpO2: 96%  Weight: 232 lb 9.6 oz (105.5 kg)  Height: _0  (1.727 m)    Physical Exam Constitutional:      Appearance: Normal appearance.  HENT:     Head: Normocephalic.     Right Ear: Tympanic membrane and external ear normal.  Left Ear: Tympanic membrane and external ear normal.     Nose: Nose normal.  Eyes:     Extraocular Movements: Extraocular movements intact.     Pupils: Pupils are equal, round, and reactive to light.  Neck:     Comments: thick Cardiovascular:     Rate and Rhythm: Normal rate and regular rhythm.     Pulses: Normal pulses.     Heart sounds: Normal heart sounds.  Pulmonary:     Effort: Pulmonary effort is normal.     Breath sounds: Normal breath sounds.  Abdominal:     General: Bowel sounds are normal. There is distension.     Palpations: Abdomen is soft.     Comments: Umbilical hernia  Musculoskeletal:        General: Normal range of motion.     Cervical back: Normal range of motion and neck supple.   Skin:    General: Skin is warm and dry.  Neurological:     Mental Status: She is alert and oriented to person, place, and time.  Psychiatric:        Mood and Affect: Mood normal.        Behavior: Behavior normal.        Thought Content: Thought content normal.        Judgment: Judgment normal.    ROS  Last 3 Office BP readings: BP Readings from Last 3 Encounters:  12/20/20 128/87  11/21/20 108/76  06/22/20 131/85    BMET    Component Value Date/Time   NA 140 06/12/2020 0652   NA 141 03/31/2020 1036   K 3.1 (L) 06/12/2020 0652   CL 103 06/12/2020 0652   CO2 26 06/12/2020 0652   GLUCOSE 137 (H) 06/12/2020 0652   BUN 12 06/12/2020 0652   BUN 13 03/31/2020 1036   CREATININE 0.60 06/12/2020 0652   CALCIUM 9.3 06/12/2020 0652   GFRNONAA >60 06/12/2020 0652   GFRAA 106 03/31/2020 1036    Renal function: CrCl cannot be calculated (Patient's most recent lab result is older than the maximum 21 days allowed.).  Clinical ASCVD: Yes  The 10-year ASCVD risk score Mikey Bussing DC Jr., et al., 2013) is: 13.4%   Values used to calculate the score:     Age: 51 years     Sex: Female     Is Non-Hispanic African American: Yes     Diabetic: Yes     Tobacco smoker: No     Systolic Blood Pressure: 161 mmHg     Is BP treated: Yes     HDL Cholesterol: 47 mg/dL     Total Cholesterol: 181 mg/dL   Meds ordered this encounter  Medications   amLODipine (NORVASC) 10 MG tablet    Sig: TAKE 1 TABLET (10 MG TOTAL) BY MOUTH DAILY.    Dispense:  90 tablet    Refill:  1   lisinopril-hydrochlorothiazide (ZESTORETIC) 20-25 MG tablet    Sig: TAKE 1 TABLET BY MOUTH DAILY.    Dispense:  90 tablet    Refill:  3   ASSESSMENT & PLAN: -Counseled on lifestyle modifications for blood pressure control including reduced dietary sodium, increased exercise, weight reduction and adequate sleep. Also, educated patient about the risk for cardiovascular events, stroke and heart attack. Also counseled patient  about the importance of medication adherence. If you participate in smoking, it is important to stop using tobacco as this will increase the risks associated with uncontrolled blood pressure.   -Hypertension longstanding diagnosed currently  amlodipine 24m  and lisinopril /HCTZ 20/25 on current medications. Patient is adherent with current medications.   Goal BP:  For patients younger than 60: Goal BP < 130/80. For patients 60 and older: Goal BP < 140/90. For patients with diabetes: Goal BP < 130/80. Your most recent BP: 128/87  Minimize salt intake. Minimize alcohol intake EGladycewas seen today for hypertension.  Diagnoses and all orders for this visit:  Prediabetes -     HgB A1c 7.6 change to new dx T2D  Need for Tdap vaccination -     Tdap vaccine greater than or equal to 7yo IM  Need for varicella vaccine -     Varicella-zoster vaccine IM (Shingrix)  Hypertension, unspecified type Counseled on blood pressure goal of less than 130/80, low-sodium, DASH diet, medication compliance, 150 minutes of moderate intensity exercise per week. Discussed medication compliance, adverse effects.  -     amLODipine (NORVASC) 10 MG tablet; TAKE 1 TABLET (10 MG TOTAL) BY MOUTH DAILY. -     lisinopril-hydrochlorothiazide (ZESTORETIC) 20-25 MG tablet; TAKE 1 TABLET BY MOUTH DAILY. -     CMP14+EGFR  Medication refill -     amLODipine (NORVASC) 10 MG tablet; TAKE 1 TABLET (10 MG TOTAL) BY MOUTH DAILY. -     lisinopril-hydrochlorothiazide (ZESTORETIC) 20-25 MG tablet; TAKE 1 TABLET BY MOUTH DAILY.  Type 2 diabetes mellitus without complication, without long-term current use of insulin (HCC) : Goal of therapy: Less than 6.5 hemoglobin A1c.  Foods that are high in carbohydrates are the following rice, potatoes, breads, sugars, and pastas.  Reduction in the intake (eating) will assist in lowering your blood sugars.  Started Amaryl 261mdaily and metformin 1000 mg BID -     Lipid Panel -     CBC with  Differential -     CMP14+EGFR  Colon cancer screening  Refer to GI  This note has been created with DrSurveyor, quantityAny transcriptional errors are unintentional.   Sheryl PernaNP 12/20/2020, 9:40 AM

## 2020-12-21 LAB — CBC WITH DIFFERENTIAL/PLATELET
Basophils Absolute: 0 10*3/uL (ref 0.0–0.2)
Basos: 1 %
EOS (ABSOLUTE): 0.1 10*3/uL (ref 0.0–0.4)
Eos: 3 %
Hematocrit: 40.7 % (ref 34.0–46.6)
Hemoglobin: 14.4 g/dL (ref 11.1–15.9)
Immature Grans (Abs): 0 10*3/uL (ref 0.0–0.1)
Immature Granulocytes: 0 %
Lymphocytes Absolute: 1.2 10*3/uL (ref 0.7–3.1)
Lymphs: 29 %
MCH: 30 pg (ref 26.6–33.0)
MCHC: 35.4 g/dL (ref 31.5–35.7)
MCV: 85 fL (ref 79–97)
Monocytes Absolute: 0.5 10*3/uL (ref 0.1–0.9)
Monocytes: 12 %
Neutrophils Absolute: 2.3 10*3/uL (ref 1.4–7.0)
Neutrophils: 55 %
Platelets: 313 10*3/uL (ref 150–450)
RBC: 4.8 x10E6/uL (ref 3.77–5.28)
RDW: 13.1 % (ref 11.7–15.4)
WBC: 4.1 10*3/uL (ref 3.4–10.8)

## 2020-12-21 LAB — CMP14+EGFR
ALT: 81 IU/L — ABNORMAL HIGH (ref 0–32)
AST: 81 IU/L — ABNORMAL HIGH (ref 0–40)
Albumin/Globulin Ratio: 1.8 (ref 1.2–2.2)
Albumin: 4.7 g/dL (ref 3.8–4.9)
Alkaline Phosphatase: 108 IU/L (ref 44–121)
BUN/Creatinine Ratio: 14 (ref 9–23)
BUN: 11 mg/dL (ref 6–24)
Bilirubin Total: 0.5 mg/dL (ref 0.0–1.2)
CO2: 23 mmol/L (ref 20–29)
Calcium: 10 mg/dL (ref 8.7–10.2)
Chloride: 97 mmol/L (ref 96–106)
Creatinine, Ser: 0.8 mg/dL (ref 0.57–1.00)
Globulin, Total: 2.6 g/dL (ref 1.5–4.5)
Glucose: 192 mg/dL — ABNORMAL HIGH (ref 65–99)
Potassium: 3.9 mmol/L (ref 3.5–5.2)
Sodium: 137 mmol/L (ref 134–144)
Total Protein: 7.3 g/dL (ref 6.0–8.5)
eGFR: 86 mL/min/{1.73_m2} (ref >59)

## 2020-12-21 LAB — LIPID PANEL
Chol/HDL Ratio: 4 ratio (ref 0.0–4.4)
Cholesterol, Total: 179 mg/dL (ref 100–199)
HDL: 45 mg/dL (ref >39)
LDL Chol Calc (NIH): 117 mg/dL — ABNORMAL HIGH (ref 0–99)
Triglycerides: 90 mg/dL (ref 0–149)
VLDL Cholesterol Cal: 17 mg/dL (ref 5–40)

## 2020-12-22 ENCOUNTER — Other Ambulatory Visit (INDEPENDENT_AMBULATORY_CARE_PROVIDER_SITE_OTHER): Payer: Self-pay | Admitting: Primary Care

## 2020-12-22 DIAGNOSIS — E782 Mixed hyperlipidemia: Secondary | ICD-10-CM

## 2020-12-22 MED ORDER — ATORVASTATIN CALCIUM 20 MG PO TABS
20.0000 mg | ORAL_TABLET | Freq: Every day | ORAL | 3 refills | Status: DC
Start: 2020-12-22 — End: 2021-12-29
  Filled 2020-12-22: qty 90, 90d supply, fill #0

## 2020-12-23 ENCOUNTER — Other Ambulatory Visit: Payer: Self-pay

## 2020-12-27 ENCOUNTER — Other Ambulatory Visit: Payer: Self-pay

## 2020-12-30 ENCOUNTER — Other Ambulatory Visit: Payer: Self-pay

## 2021-01-26 ENCOUNTER — Ambulatory Visit (INDEPENDENT_AMBULATORY_CARE_PROVIDER_SITE_OTHER): Payer: Self-pay

## 2021-01-26 NOTE — Telephone Encounter (Signed)
Message from [place] sent at 01/26/2021 11:17 AM EDT  Summary: advice   Pt wanted to speak to a nurse since she is unable to get an appt at [facility], pt feels sick/ congested and needs some advice.           Call History   Type Contact Phone/Fax User  01/26/2021 11:16 AM EDT Phone (Incoming) [name], Sheryl Vanacker (Self) [phone] Jerilynn Mages) Pawlus, Monica A   Reason for Disposition  SEVERE coughing spells (e.g., whooping sound after coughing, vomiting after coughing)  Answer Assessment - Initial Assessment Questions 1. ONSET: "When did the cough begin?"      2 weeks ago 2. SEVERITY: "How bad is the cough today?"      Severe  3. SPUTUM: "Describe the color of your sputum" (none, dry cough; clear, white, yellow, green)     Clear 4. HEMOPTYSIS: "Are you coughing up any blood?" If so ask: "How much?" (flecks, streaks, tablespoons, etc.)     No 5. DIFFICULTY BREATHING: "Are you having difficulty breathing?" If Yes, ask: "How bad is it?" (e.g., mild, moderate, severe)    - MILD: No SOB at rest, mild SOB with walking, speaks normally in sentences, can lie down, no retractions, pulse < 100.    - MODERATE: SOB at rest, SOB with minimal exertion and prefers to sit, cannot lie down flat, speaks in phrases, mild retractions, audible wheezing, pulse 100-120.    - SEVERE: Very SOB at rest, speaks in single words, struggling to breathe, sitting hunched forward, retractions, pulse > 120      No 6. FEVER: "Do you have a fever?" If Yes, ask: "What is your temperature, how was it measured, and when did it start?"     No 7. CARDIAC HISTORY: "Do you have any history of heart disease?" (e.g., heart attack, congestive heart failure)      No 8. LUNG HISTORY: "Do you have any history of lung disease?"  (e.g., pulmonary embolus, asthma, emphysema)     Bronchitis 9. PE RISK FACTORS: "Do you have a history of blood clots?" (or: recent major surgery, recent prolonged travel, bedridden)     No 10. OTHER  SYMPTOMS: "Do you have any other symptoms?" (e.g., runny nose, wheezing, chest pain)       Wheezing 11. PREGNANCY: "Is there any chance you are pregnant?" "When was your last menstrual period?"       No 12. TRAVEL: "Have you traveled out of the country in the last month?" (e.g., travel history, exposures)       No  Protocols used: Cough - Acute Productive-A-AH

## 2021-01-26 NOTE — Telephone Encounter (Signed)
Pt. Reports she had cold symptoms 2 weeks ago. Now has a severe cough that keeps her up at night. Productive with clear mucus. No fever. COVID 19 test negative. Has tried OTC Mucinex, Robitussin, hot tea with honey. No availability in the practice. Willing to see different provider if possible. Please advise.

## 2021-01-28 ENCOUNTER — Emergency Department (HOSPITAL_BASED_OUTPATIENT_CLINIC_OR_DEPARTMENT_OTHER): Payer: 59

## 2021-01-28 ENCOUNTER — Emergency Department (HOSPITAL_BASED_OUTPATIENT_CLINIC_OR_DEPARTMENT_OTHER)
Admission: EM | Admit: 2021-01-28 | Discharge: 2021-01-28 | Disposition: A | Discharge status: Home / Self Care | Payer: 59 | Attending: Emergency Medicine | Admitting: Emergency Medicine

## 2021-01-28 ENCOUNTER — Encounter (HOSPITAL_BASED_OUTPATIENT_CLINIC_OR_DEPARTMENT_OTHER): Payer: Self-pay

## 2021-01-28 ENCOUNTER — Other Ambulatory Visit: Payer: Self-pay

## 2021-01-28 DIAGNOSIS — I10 Essential (primary) hypertension: Secondary | ICD-10-CM | POA: Diagnosis not present

## 2021-01-28 DIAGNOSIS — R058 Other specified cough: Secondary | ICD-10-CM | POA: Diagnosis not present

## 2021-01-28 DIAGNOSIS — Z20822 Contact with and (suspected) exposure to covid-19: Secondary | ICD-10-CM | POA: Insufficient documentation

## 2021-01-28 DIAGNOSIS — R059 Cough, unspecified: Secondary | ICD-10-CM | POA: Diagnosis present

## 2021-01-28 DIAGNOSIS — Z79899 Other long term (current) drug therapy: Secondary | ICD-10-CM | POA: Diagnosis not present

## 2021-01-28 DIAGNOSIS — R052 Subacute cough: Secondary | ICD-10-CM

## 2021-01-28 LAB — RESP PANEL BY RT-PCR (FLU A&B, COVID) ARPGX2
Influenza A by PCR: NEGATIVE
Influenza B by PCR: NEGATIVE
SARS Coronavirus 2 by RT PCR: NEGATIVE

## 2021-01-28 MED ORDER — BENZONATATE 100 MG PO CAPS: 100.0000 mg | ORAL_CAPSULE | Freq: Three times a day (TID) | ORAL | 0 refills | Status: DC

## 2021-01-28 MED ORDER — HYDROCOD POLST-CPM POLST ER 10-8 MG/5ML PO SUER: 5.0000 mL | Freq: Two times a day (BID) | ORAL | 0 refills | Status: DC

## 2021-01-28 NOTE — ED Triage Notes (Signed)
Pt reports ongoing productive cough for two weeks, unable to be seen in PCP. Denies fever, N/V/D.

## 2021-01-28 NOTE — Discharge Instructions (Addendum)
Take tussionex twice daily for cough. You can tessalon perrles during the day as an alternative option as this medicine will not make you as sleepy.  Continue taking your reflux medicine as prescribed. Schedule follow-up with your primary care doctor in the next couple weeks in case the symptoms persist. If things change or worsen return back to the ED as needed.

## 2021-01-28 NOTE — ED Provider Notes (Signed)
[facility] EMERGENCY DEPARTMENT Provider Note   CSN: [id number] Arrival date & time: 01/28/21  1103     History No chief complaint on file.   Sheryl [name] is a 55 y.o. female.  HPI  Patient presents with cough x2 weeks.  His productive cough, denies any yellow or green sputum.  The cough is worse when she lays down at light, no relationship to spicy food or diet.  Patient does take reflux medicine, no recent changes.  She not having any headaches, body aches, fevers, chills.  Denies any shortness of breath or chest pain.  No history of COPD or asthma, does not smoke any cigarettes.  No hemoptysis.  Patient has tried cough syrup with minimal relief.  Past Medical History:  Diagnosis Date   Blood transfusion without reported diagnosis    had transfusion with hysterectomy   Fibroid    H/O blood clots    History of hysterectomy    fibroids and heavy cycles   Hypertension     There are no problems to display for this patient.   Past Surgical History:  Procedure Laterality Date   ABDOMINAL HYSTERECTOMY       OB History     Gravida  3   Para  2   Term      Preterm      AB  1   Living  2      SAB      IAB  1   Ectopic      Multiple      Live Births              Family History  Problem Relation Age of Onset   Stroke Mother    Diabetes Mother    Hypertension Mother    Stroke Father    Heart attack Maternal Grandmother    Breast cancer Paternal Grandmother    Diabetes Other    Hypertension Other    Stroke Other    Cancer Other    Heart attack Other     Social History   Tobacco Use   Smoking status: Never   Smokeless tobacco: Never  Vaping Use   Vaping Use: Never used  Substance Use Topics   Alcohol use: No   Drug use: No    Home Medications Prior to Admission medications   Medication Sig Start Date End Date Taking? Authorizing Provider  benzonatate (TESSALON) 100 MG capsule Take 1 capsule (100 mg total) by mouth  every 8 (eight) hours. 01/28/21  Yes Sherrill Raring, PA-C  chlorpheniramine-HYDROcodone (TUSSIONEX PENNKINETIC ER) 10-8 MG/5ML SUER Take 5 mLs by mouth 2 (two) times daily. 01/28/21  Yes Sherrill Raring, PA-C  lisinopril-hydrochlorothiazide (ZESTORETIC) 20-25 MG tablet TAKE 1 TABLET BY MOUTH DAILY. 12/20/20 12/20/21 Yes Kerin Perna, NP  amLODipine (NORVASC) 10 MG tablet TAKE 1 TABLET (10 MG TOTAL) BY MOUTH DAILY. 12/20/20 12/20/21  Kerin Perna, NP  atorvastatin (LIPITOR) 20 MG tablet Take 1 tablet (20 mg total) by mouth daily. 12/22/20   Kerin Perna, NP  Blood Glucose Monitoring Suppl (TRUE METRIX METER) w/Device KIT 1 kit by Does not apply route 3 (three) times daily as needed. Patient not taking: Reported on 12/20/2020 05/21/19   Kerin Perna, NP  glimepiride (AMARYL) 2 MG tablet Take 1 tablet (2 mg total) by mouth daily before breakfast. 12/20/20   Kerin Perna, NP  lansoprazole (PREVACID) 30 MG capsule TAKE 1 CAPSULE (30 MG TOTAL) BY MOUTH DAILY  AT 12 NOON. 11/24/20 11/24/21  Kerin Perna, NP  metFORMIN (GLUCOPHAGE) 1000 MG tablet Take 1 tablet (1,000 mg total) by mouth 2 (two) times daily with a meal. 12/20/20   Kerin Perna, NP  hydrochlorothiazide (HYDRODIURIL) 25 MG tablet Take 1 tablet (25 mg total) by mouth daily. Take on tablet in the morning. Patient not taking: Reported on 03/31/2020 05/21/19 03/31/20  Kerin Perna, NP    Allergies    Aspirin, Cyclobenzaprine, Naproxen sodium, Zithromax [azithromycin dihydrate], and Dilaudid [hydromorphone]  Review of Systems   Review of Systems  Constitutional:  Negative for fever.  Respiratory:  Positive for cough. Negative for shortness of breath.   Cardiovascular:  Negative for chest pain.  Gastrointestinal:  Negative for nausea and vomiting.   Physical Exam Updated Vital Signs BP (!) 138/96 (BP Location: Right Arm)   Pulse 68   Temp 98.6 F (37 C) (Oral)   Resp 15   Ht _0  (1.727 m)   Wt 105.2 kg    LMP 06/29/2010   SpO2 99%   BMI 35.28 kg/m   Physical Exam Vitals and nursing note reviewed. Exam conducted with a chaperone present.  Constitutional:      General: She is not in acute distress.    Appearance: Normal appearance.  HENT:     Head: Normocephalic and atraumatic.  Eyes:     General: No scleral icterus.    Extraocular Movements: Extraocular movements intact.     Pupils: Pupils are equal, round, and reactive to light.  Cardiovascular:     Rate and Rhythm: Normal rate and regular rhythm.  Pulmonary:     Effort: Pulmonary effort is normal.     Comments: Lungs are clear to auscultation bilaterally, no wheezing. Skin:    Coloration: Skin is not jaundiced.  Neurological:     Mental Status: She is alert. Mental status is at baseline.     Coordination: Coordination normal.    ED Results / Procedures / Treatments   Labs (all labs ordered are listed, but only abnormal results are displayed) Labs Reviewed  RESP PANEL BY RT-PCR (FLU A&B, COVID) [id number]    EKG None  Radiology DG Chest 2 View  Result Date: 01/28/2021 CLINICAL DATA:  Patient with productive cough for 2 weeks. EXAM: CHEST - 2 VIEW COMPARISON:  Chest radiograph 07/13/2016. FINDINGS: The heart size and mediastinal contours are within normal limits. Both lungs are clear. The visualized skeletal structures are unremarkable. IMPRESSION: No active cardiopulmonary disease. Electronically Signed   By: Lovey Newcomer M.D.   On: 01/28/2021 12:44    Procedures Procedures   Medications Ordered in ED Medications - No data to display  ED Course  I have reviewed the triage vital signs and the nursing notes.  Pertinent labs & imaging results that were available during my care of the patient were reviewed by me and considered in my medical decision making (see chart for details).    MDM Rules/Calculators/A&P                           Patient vitals are stable, no hypoxia or tachycardia or tachypnea.  No signs of  respiratory distress, no systemic stomach systems that would be consistent with a viral illness.  She is having persistent cough, suspect this could be related to GERD.  X-ray ordered to assess for pneumonia, COVID and flu test ordered for persistent subacute cough.  COVID and flu test negative, radiograph  negative for pneumonia.  Doubt any emergent disease at this time, advised patient continue taking reflux medicine.  We will give symptomatic treatment and have her follow-up with her PCP in the next couple weeks if no improvement.  Final Clinical Impression(s) / ED Diagnoses Final diagnoses:  Subacute cough    Rx / DC Orders ED Discharge Orders          Ordered    benzonatate (TESSALON) 100 MG capsule  Every 8 hours        01/28/21 1424    chlorpheniramine-HYDROcodone (TUSSIONEX PENNKINETIC ER) 10-8 MG/5ML SUER  2 times daily        01/28/21 1424             Sherrill Raring, PA-C 01/28/21 1427    Godfrey Pick, MD 01/29/21 [id number]

## 2021-01-28 NOTE — ED Notes (Signed)
RT ambulated to room with pulse ox. SAT 97%. No SOB noted

## 2021-02-06 ENCOUNTER — Ambulatory Visit (INDEPENDENT_AMBULATORY_CARE_PROVIDER_SITE_OTHER): Payer: Self-pay

## 2021-02-06 NOTE — Telephone Encounter (Signed)
Returned pt's call. Pt was seen at the ED 01/28/2021 for cough.  Pt is allergic to dogs and spent a good bit of time with a dog. Her nasal congestion and cough have returned and she is having some eye crustiness.   Pt is interested in knowing what OTC medications may help with this.   Pt would also like to fill the other half of the Rx written for her at the ED. Pharmacy stated that they could only fill have because it is a controlled medication.   chlorpheniramine-HYDROcodone (TUSSIONEX PENNKINETIC ER) 10-8 MG/5ML SUER Take 5 mLs by mouth 2 (   Pt also needs refills on her HTN meds. She has refills remaining.   Pt will go to the pharmacy and get help from the pharmasit with cold medication, and allergy medication. Pt was going to use an old medication for her eye ( 57 years old) advised against using old medication.   Pt has an appointment on 10/26 to see her PCP.  Pt will c/b if needed.  Pt agrees with plan. Reason for Disposition  Cough with cold symptoms (e.g., runny nose, postnasal drip, throat clearing)  Answer Assessment - Initial Assessment Questions 1. ONSET: "When did the cough begin?"      2 weeks ago. Cough had somewhat resolved and then had exposure to dog. 2. SEVERITY: "How bad is the cough today?"      mild 3. SPUTUM: "Describe the color of your sputum" (none, dry cough; clear, white, yellow, green)     Very light yello 4. HEMOPTYSIS: "Are you coughing up any blood?" If so ask: "How much?" (flecks, streaks, tablespoons, etc.)     no 5. DIFFICULTY BREATHING: "Are you having difficulty breathing?" If Yes, ask: "How bad is it?" (e.g., mild, moderate, severe)    - MILD: No SOB at rest, mild SOB with walking, speaks normally in sentences, can lie down, no retractions, pulse < 100.    - MODERATE: SOB at rest, SOB with minimal exertion and prefers to sit, cannot lie down flat, speaks in phrases, mild retractions, audible wheezing, pulse 100-120.    - SEVERE: Very SOB at rest,  speaks in single words, struggling to breathe, sitting hunched forward, retractions, pulse > 120      mild 6. FEVER: "Do you have a fever?" If Yes, ask: "What is your temperature, how was it measured, and when did it start?"     no 7. CARDIAC HISTORY: "Do you have any history of heart disease?" (e.g., heart attack, congestive heart failure)      no 8. LUNG HISTORY: "Do you have any history of lung disease?"  (e.g., pulmonary embolus, asthma, emphysema)     no 9. PE RISK FACTORS: "Do you have a history of blood clots?" (or: recent major surgery, recent prolonged travel, bedridden)     no 10. OTHER SYMPTOMS: "Do you have any other symptoms?" (e.g., runny nose, wheezing, chest pain)       no 11. PREGNANCY: "Is there any chance you are pregnant?" "When was your last menstrual period?"       no 12. TRAVEL: "Have you traveled out of the country in the last month?" (e.g., travel history, exposures)       no  Protocols used: Cough - Acute Productive-A-AH

## 2021-02-06 NOTE — Telephone Encounter (Signed)
Sent to PCP ?

## 2021-02-08 ENCOUNTER — Other Ambulatory Visit: Payer: Self-pay

## 2021-02-08 NOTE — Telephone Encounter (Signed)
Called patient spoke with [facility] pharmacist per insurance only able to filled narcotic for 7 days. She admits to the pharmacy did make her aware.  Dr. Naomie Dean would prescribed it all the time medication does work. She understands.

## 2021-02-09 ENCOUNTER — Other Ambulatory Visit: Payer: Self-pay

## 2021-02-22 ENCOUNTER — Other Ambulatory Visit: Payer: Self-pay

## 2021-02-22 ENCOUNTER — Encounter (INDEPENDENT_AMBULATORY_CARE_PROVIDER_SITE_OTHER): Payer: Self-pay | Admitting: Primary Care

## 2021-02-22 ENCOUNTER — Ambulatory Visit (INDEPENDENT_AMBULATORY_CARE_PROVIDER_SITE_OTHER): Payer: 59 | Admitting: Primary Care

## 2021-02-22 VITALS — BP 125/85 | HR 83 | Temp 97.3°F | Ht 68.0 in | Wt 228.4 lb

## 2021-02-22 DIAGNOSIS — R051 Acute cough: Secondary | ICD-10-CM | POA: Diagnosis not present

## 2021-02-22 MED ORDER — HYDROCOD POLST-CPM POLST ER 10-8 MG/5ML PO SUER: 5.0000 mL | Freq: Two times a day (BID) | ORAL | 0 refills | Status: DC | PRN

## 2021-02-22 NOTE — Progress Notes (Signed)
[facility]  Cc: COUGH  SUBJECTIVE:  Sheryl [name] is a 55 y.o. female who presents with cough for 6 weeks .  Denies/ Admits positive sick exposure or precipitating event.  Describes cough as constant/ intermittent and dry/ productive with green sputum.  Has tried guaifenesin    with/ without relief.  Symptoms are made worse with pets.  Reports/ denies previous symptoms in the past.   Denies fever, chills, fatigue, , sore throat, SOB, wheezing, chest pain, nausea, changes in bowel or bladder habits.    ROS: As per HPI.  All other pertinent ROS negative.     Past Medical History:  Diagnosis Date   Blood transfusion without reported diagnosis    had transfusion with hysterectomy   Fibroid    H/O blood clots    History of hysterectomy    fibroids and heavy cycles   Hypertension    Past Surgical History:  Procedure Laterality Date   ABDOMINAL HYSTERECTOMY     Allergies  Allergen Reactions   Aspirin Anaphylaxis   Cyclobenzaprine Anaphylaxis   Naproxen Sodium Anaphylaxis   Zithromax [Azithromycin Dihydrate] Anaphylaxis   Dilaudid [Hydromorphone] Nausea And Vomiting   Current Outpatient Medications on File Prior to Visit  Medication Sig Dispense Refill   amLODipine (NORVASC) 10 MG tablet TAKE 1 TABLET (10 MG TOTAL) BY MOUTH DAILY. 90 tablet 1   atorvastatin (LIPITOR) 20 MG tablet Take 1 tablet (20 mg total) by mouth daily. 90 tablet 3   benzonatate (TESSALON) 100 MG capsule Take 1 capsule (100 mg total) by mouth every 8 (eight) hours. 21 capsule 0   Blood Glucose Monitoring Suppl (TRUE METRIX METER) w/Device KIT 1 kit by Does not apply route 3 (three) times daily as needed. 1 kit 0   chlorpheniramine-HYDROcodone (TUSSIONEX PENNKINETIC ER) 10-8 MG/5ML SUER Take 5 mLs by mouth 2 (two) times daily. 140 mL 0   glimepiride (AMARYL) 2 MG tablet Take 1 tablet (2 mg total) by mouth daily before breakfast. 30 tablet 3   lansoprazole (PREVACID) 30 MG capsule TAKE 1  CAPSULE (30 MG TOTAL) BY MOUTH DAILY AT 12 NOON. 30 capsule 1   lisinopril-hydrochlorothiazide (ZESTORETIC) 20-25 MG tablet TAKE 1 TABLET BY MOUTH DAILY. 90 tablet 3   metFORMIN (GLUCOPHAGE) 1000 MG tablet Take 1 tablet (1,000 mg total) by mouth 2 (two) times daily with a meal. 180 tablet 1   [DISCONTINUED] hydrochlorothiazide (HYDRODIURIL) 25 MG tablet Take 1 tablet (25 mg total) by mouth daily. Take on tablet in the morning. (Patient not taking: Reported on 03/31/2020) 90 tablet 1   No current facility-administered medications on file prior to visit.    Social History   Socioeconomic History   Marital status: Widowed    Spouse name: Not on file   Number of children: Not on file   Years of education: Not on file   Highest education level: Not on file  Occupational History   Not on file  Tobacco Use   Smoking status: Never   Smokeless tobacco: Never  Vaping Use   Vaping Use: Never used  Substance and Sexual Activity   Alcohol use: No   Drug use: No   Sexual activity: Yes    Birth control/protection: Surgical    Comment: Hyst  Other Topics Concern   Not on file  Social History Narrative   Not on file   Social Determinants of Health   Financial Resource Strain: Not on file  Food Insecurity: Not on file  Transportation  Needs: Not on file  Physical Activity: Not on file  Stress: Not on file  Social Connections: Not on file  Intimate Partner Violence: Not on file   Family History  Problem Relation Age of Onset   Stroke Mother    Diabetes Mother    Hypertension Mother    Stroke Father    Heart attack Maternal Grandmother    Breast cancer Paternal Grandmother    Diabetes Other    Hypertension Other    Stroke Other    Cancer Other    Heart attack Other      OBJECTIVE:  Vitals:   02/22/21 1506  BP: 125/85  Pulse: 83  Temp: (!) 97.3 F (36.3 C)  TempSrc: Temporal  SpO2: 96%  Weight: 228 lb 6.4 oz (103.6 kg)  Height: 5' 8"  (1.727 m)     General appearance:  Alert, appears fatigued, but nontoxic; speaking in full sentences without difficulty HEENT:NCAT; Ears: EACs clear, TMs pearly gray; Eyes: PERRL.  EOM grossly intact. Nose: nares patent without rhinorrhea; Throat: tonsils nonerythematous or enlarged, uvula midline  Neck: supple without LAD Lungs: clear to auscultation bilaterally without adventitious breath sounds; normal respiratory effort; mild cough present Heart: regular rate and rhythm.  Radial pulses 2+ symmetrical bilaterally Skin: warm and dry Psychological: alert and cooperative; normal mood and affect  DIAGNOSTIC STUDIES:  No results found.   ASSESSMENT & PLAN:  Dorthia was seen today for hospitalization follow-up.  Diagnoses and all orders for this visit:  Acute cough   Reviewed with [patient duplicate medications and reading labels. Get plenty of rest and push fluids Prescribed tessolone perles as needed for cough Use Over the counter medication as needed for symptomatic relief Go to ER if you have any new or worsening symptoms such as fever, chills, fatigue, shortness of breath, wheezing, chest pain, nausea, changes in bowel or bladder habits,   Reviewed expectations re: course of current medical issues. Questions answered. Outlined signs and symptoms indicating need for more acute intervention. Patient verbalized understanding. After Visit Summary given.    This note has been created with Surveyor, quantity. Any transcriptional errors are unintentional.   Kerin Perna, NP 02/22/2021, 3:28 PM

## 2021-03-02 ENCOUNTER — Other Ambulatory Visit: Payer: Self-pay

## 2021-03-09 ENCOUNTER — Encounter: Payer: Self-pay | Admitting: Pulmonary Disease

## 2021-03-09 ENCOUNTER — Other Ambulatory Visit: Payer: Self-pay

## 2021-03-09 ENCOUNTER — Ambulatory Visit (INDEPENDENT_AMBULATORY_CARE_PROVIDER_SITE_OTHER): Payer: 59 | Admitting: Pulmonary Disease

## 2021-03-09 VITALS — BP 126/84 | HR 82 | Ht 68.0 in | Wt 223.0 lb

## 2021-03-09 DIAGNOSIS — R052 Subacute cough: Secondary | ICD-10-CM | POA: Diagnosis not present

## 2021-03-09 NOTE — Progress Notes (Signed)
_0  ID: Sheryl [name], female    DOB: [birth year], 55 y.o.   MRN: [id number]  Chief Complaint  Patient presents with   Consult    Referral for cough. States she has had the cough since September 19th 2022. Cough tends to get worse at night. Cough started out very productive but now is semi-productive with clear phlegm.     Referring provider: Kerin Perna, NP  HPI:   55 y.o. woman whom we are seeing in consultation for evaluation of cough. Note from referring provider reviewed.  Recent ED note reviewed.  Patient was in normal state of health until several weeks ago.  Through social circumstances she was visiting at home surrounding the illness and subsequent death of a loved one.  While visiting she developed scratchy throat, watery eyes, nasal congestion.  Subsequent cough.  Productive of phlegm.  Was going back and forth in his house every day for a few days.  Removed her cell from this environment for several days with mild improvement in symptoms.  Went to the ED 01/28/2021.  There chest x-ray was taken and on my review interpretation shows clear lungs bilaterally.  She was given opiate cough syrup.  This helped particularly with at night.  Under others doing certain symptoms, she returned to the same environment.  Cough worsened and came back.  Linked to what she thinks as dog dander as there are large dogs in the home.  She went to her PCP and opiate cough syrup was represcribed.  Gradually, symptoms are improving.  Still worse at night.  But has been able to go nights in between without cough syrup over the last week or 2.  She thinks things are gradually improving.  No dyspnea on exertion.  Occasional production of small amount mucus.  She does reports a history of recurrent bronchitis particularly in the winter.  Fortunately has been 3 to 4 years since last episode.  She was tested in the past for allergies.  Skin testing.  She is allergic to most trees, pet dander as she  recalls.  PMH: Hypertension Social history: Reviewed with patient, she denies any surgeries Family history: Multiple relatives with allergies, Social history: Never smoker, lives in [place], works as Public relations account executive / Pulmonary Flowsheets:   ACT:  No flowsheet data found.  MMRC: No flowsheet data found.  Epworth:  No flowsheet data found.  Tests:   FENO:  No results found for: NITRICOXIDE  PFT: No flowsheet data found.  WALK:  No flowsheet data found.  Imaging: Personally reviewed as per EMR discussion in this note  Lab Results: Personally reviewed CBC    Component Value Date/Time   WBC 4.1 12/20/2020 0957   WBC 5.5 06/12/2020 0652   RBC 4.80 12/20/2020 0957   RBC 4.77 06/12/2020 0652   HGB 14.4 12/20/2020 0957   HGB 13.4 03/01/2011 1410   HCT 40.7 12/20/2020 0957   HCT 39.5 03/01/2011 1410   PLT 313 12/20/2020 0957   MCV 85 12/20/2020 0957   MCV 82.1 03/01/2011 1410   MCH 30.0 12/20/2020 0957   MCH 30.0 06/12/2020 0652   MCHC 35.4 12/20/2020 0957   MCHC 35.0 06/12/2020 0652   RDW 13.1 12/20/2020 0957   RDW 22.3 (H) 03/01/2011 1410   LYMPHSABS 1.2 12/20/2020 0957   LYMPHSABS 1.2 03/01/2011 1410   MONOABS 0.5 06/12/2020 0652   MONOABS 0.3 03/01/2011 1410   EOSABS 0.1 12/20/2020 0957   BASOSABS 0.0 12/20/2020 0957  BASOSABS 0.0 03/01/2011 1410    BMET    Component Value Date/Time   NA 137 12/20/2020 0957   K 3.9 12/20/2020 0957   CL 97 12/20/2020 0957   CO2 23 12/20/2020 0957   GLUCOSE 192 (H) 12/20/2020 0957   GLUCOSE 137 (H) 06/12/2020 0652   BUN 11 12/20/2020 0957   CREATININE 0.80 12/20/2020 0957   CALCIUM 10.0 12/20/2020 0957   GFRNONAA >60 06/12/2020 0652   GFRAA 106 03/31/2020 1036    BNP No results found for: BNP  ProBNP    Component Value Date/Time   PROBNP <30.0 05/14/2007 1140    Specialty Problems   None   Allergies  Allergen Reactions   Aspirin Anaphylaxis   Cyclobenzaprine Anaphylaxis    Naproxen Sodium Anaphylaxis   Zithromax [Azithromycin Dihydrate] Anaphylaxis   Dilaudid [Hydromorphone] Nausea And Vomiting    Immunization History  Administered Date(s) Administered   Influenza,inj,Quad PF,6+ Mos 02/18/2019, 03/31/2020   Moderna Sars-Covid-2 Vaccination 05/13/2019, 06/11/2019   Tdap 06/27/2010, 12/20/2020   Zoster Recombinat (Shingrix) 12/20/2020    Past Medical History:  Diagnosis Date   Blood transfusion without reported diagnosis    had transfusion with hysterectomy   Fibroid    H/O blood clots    History of hysterectomy    fibroids and heavy cycles   Hypertension     Tobacco History: Social History   Tobacco Use  Smoking Status Never  Smokeless Tobacco Never   Counseling given: Not Answered   Continue to not smoke  Outpatient Encounter Medications as of 03/09/2021  Medication Sig   amLODipine (NORVASC) 10 MG tablet TAKE 1 TABLET (10 MG TOTAL) BY MOUTH DAILY.   atorvastatin (LIPITOR) 20 MG tablet Take 1 tablet (20 mg total) by mouth daily.   benzonatate (TESSALON) 100 MG capsule Take 1 capsule (100 mg total) by mouth every 8 (eight) hours.   Blood Glucose Monitoring Suppl (TRUE METRIX METER) w/Device KIT 1 kit by Does not apply route 3 (three) times daily as needed.   chlorpheniramine-HYDROcodone (TUSSIONEX PENNKINETIC ER) 10-8 MG/5ML SUER Take 5 mLs by mouth 2 (two) times daily.   chlorpheniramine-HYDROcodone (TUSSIONEX PENNKINETIC ER) 10-8 MG/5ML SUER Take 5 mLs by mouth every 12 (twelve) hours as needed for cough.   glimepiride (AMARYL) 2 MG tablet Take 1 tablet (2 mg total) by mouth daily before breakfast.   lansoprazole (PREVACID) 30 MG capsule TAKE 1 CAPSULE (30 MG TOTAL) BY MOUTH DAILY AT 12 NOON.   lisinopril-hydrochlorothiazide (ZESTORETIC) 20-25 MG tablet TAKE 1 TABLET BY MOUTH DAILY.   metFORMIN (GLUCOPHAGE) 1000 MG tablet Take 1 tablet (1,000 mg total) by mouth 2 (two) times daily with a meal.   [DISCONTINUED] hydrochlorothiazide  (HYDRODIURIL) 25 MG tablet Take 1 tablet (25 mg total) by mouth daily. Take on tablet in the morning. (Patient not taking: Reported on 03/31/2020)   No facility-administered encounter medications on file as of 03/09/2021.     Review of Systems  Review of Systems  No chest pain with exertion.  No orthopnea or PND.  No lower extreme swelling.  Comprehensive review of systems otherwise negative. Physical Exam  BP 126/84   Pulse 82   Ht _0  (1.727 m)   Wt 223 lb (101.2 kg)   LMP 06/29/2010   SpO2 100% Comment: on RA  BMI 33.91 kg/m   Wt Readings from Last 5 Encounters:  03/09/21 223 lb (101.2 kg)  02/22/21 228 lb 6.4 oz (103.6 kg)  01/28/21 232 lb (105.2 kg)  12/20/20  232 lb 9.6 oz (105.5 kg)  11/21/20 226 lb (102.5 kg)    BMI Readings from Last 5 Encounters:  03/09/21 33.91 kg/m  02/22/21 34.73 kg/m  01/28/21 35.28 kg/m  12/20/20 35.37 kg/m  11/21/20 34.36 kg/m     Physical Exam General: Well-appearing, no acute distress Eyes: EOMI, icterus Neck: Supple, no JVP Pulmonary: Clear, normal work of breathing Cardiovascular: Regular rate and rhythm, no murmur Abdomen: Nondistended, bowel sounds present MSK: No synovitis, no joint effusion Neuro: Normal gait, no weakness Psych: Normal mood, full affect   Assessment & Plan:   Cough: Primarily nocturnal.  Currently triggered by dog dander.  Slowly improving.  No GERD.  No nasal congestion, postnasal drip recurrent.  Likely reflective of cough variant asthma.  Given improvement with time and removal from environment, will watch and wait.  Consider ICS/LABA therapy in the future if symptoms recur.  Asthma: Based on atopic symptoms, positive allergy test in the past, recurrent bronchitis in the past but fortunately no issues over the last several years, recent issue with cough triggered by dog dander.  Consider ICS/LABA therapy in the future if symptoms recur or fail to improve.   Return in about 6 months (around  09/06/2021).   Lanier Clam, MD 03/09/2021

## 2021-03-09 NOTE — Patient Instructions (Signed)
Nice to meet you  I am glad the cough is getting better  With your history of allergies and the recent clear trigger from the dog dander, I have a high suspicion for that asthma may be present.  Fortunately, it sounds like you have gone many years without recurrent bronchitis, other signs of poorly controlled asthma.  It seems like is not a major problem for you other than the recent cough.  Lets give it some more time and see if things get better.  If not and the cough worsens or fails to improve, let me know and I will recommend an inhaler to use every day to help prevent this.  Return to clinic in 6 months or sooner as needed with Dr. Silas Flood

## 2021-03-13 ENCOUNTER — Encounter (INDEPENDENT_AMBULATORY_CARE_PROVIDER_SITE_OTHER): Payer: Self-pay

## 2021-03-13 DIAGNOSIS — E119 Type 2 diabetes mellitus without complications: Secondary | ICD-10-CM

## 2021-03-13 HISTORY — DX: Type 2 diabetes mellitus without complications: E11.9

## 2021-03-13 LAB — HM DIABETES EYE EXAM

## 2021-03-20 ENCOUNTER — Other Ambulatory Visit: Payer: Self-pay

## 2021-03-21 ENCOUNTER — Other Ambulatory Visit: Payer: Self-pay

## 2021-03-28 ENCOUNTER — Other Ambulatory Visit: Payer: Self-pay

## 2021-03-28 ENCOUNTER — Encounter (INDEPENDENT_AMBULATORY_CARE_PROVIDER_SITE_OTHER): Payer: Self-pay | Admitting: Primary Care

## 2021-03-28 ENCOUNTER — Ambulatory Visit (INDEPENDENT_AMBULATORY_CARE_PROVIDER_SITE_OTHER): Payer: 59 | Admitting: Primary Care

## 2021-03-28 VITALS — BP 129/87 | HR 87 | Temp 97.3°F | Ht 68.0 in | Wt 218.2 lb

## 2021-03-28 DIAGNOSIS — E119 Type 2 diabetes mellitus without complications: Secondary | ICD-10-CM

## 2021-03-28 DIAGNOSIS — I10 Essential (primary) hypertension: Secondary | ICD-10-CM

## 2021-03-28 DIAGNOSIS — Z1211 Encounter for screening for malignant neoplasm of colon: Secondary | ICD-10-CM

## 2021-03-28 DIAGNOSIS — K219 Gastro-esophageal reflux disease without esophagitis: Secondary | ICD-10-CM

## 2021-03-28 LAB — POCT GLYCOSYLATED HEMOGLOBIN (HGB A1C): Hemoglobin A1C: 6.8 % — AB (ref 4.0–5.6)

## 2021-03-28 MED ORDER — METFORMIN HCL ER 500 MG PO TB24
500.0000 mg | ORAL_TABLET | Freq: Every day | ORAL | 1 refills | Status: DC
  Filled 2021-03-28: qty 90, 90d supply, fill #0

## 2021-03-28 MED ORDER — LANSOPRAZOLE 30 MG PO CPDR
DELAYED_RELEASE_CAPSULE | ORAL | 1 refills | Status: DC
  Filled 2021-03-28: qty 30, fill #0
  Filled 2021-05-11: qty 30, 30d supply, fill #0
  Filled 2021-05-11: qty 30, fill #0
  Filled 2021-07-21: qty 30, 30d supply, fill #1

## 2021-03-28 NOTE — Progress Notes (Signed)
[facility]  Subjective:  Patient ID: Sheryl [name], female    DOB: 1966-02-09  Age: 55 y.o. MRN: [id number]  CC: Hypertension and Diabetes   HPI Sheryl [name] presents for follow-up of diabetes. Patient does not check blood sugar at home  Compliant with meds - No Checking CBGs? No  Fasting avg -   Postprandial average -  Exercising regularly? - Yes Watching carbohydrate intake? - Yes Neuropathy ? - No Hypoglycemic events - No  - Recovers with :   Pertinent ROS:  Polyuria - No Polydipsia - No Vision problems - No Management of hypertension Denies shortness of breath, headaches, chest pain or lower extremity edema   Medications as noted below. Taking them regularly without complication/adverse reaction being reported today.   History Sheryl [name] has a past medical history of Blood transfusion without reported diagnosis, Diabetes ([place]) (03/13/2021), Fibroid, H/O blood clots, History of hysterectomy, and Hypertension.   She has a past surgical history that includes Abdominal hysterectomy.   Sheryl [name] family history includes Breast cancer in Sheryl [name] paternal grandmother; Cancer in an other family member; Diabetes in Sheryl [name] mother and another family member; Heart attack in Sheryl [name] maternal grandmother and another family member; Hypertension in Sheryl [name] mother and another family member; Stroke in Sheryl [name] father, mother, and another family member.She reports that she has never smoked. She has never used smokeless tobacco. She reports that she does not drink alcohol and does not use drugs.  Current Outpatient Medications on File Prior to Visit  Medication Sig Dispense Refill   amLODipine (NORVASC) 10 MG tablet TAKE 1 TABLET (10 MG TOTAL) BY MOUTH DAILY. 90 tablet 1   atorvastatin (LIPITOR) 20 MG tablet Take 1 tablet (20 mg total) by mouth daily. 90 tablet 3   benzonatate (TESSALON) 100 MG capsule Take 1 capsule (100 mg total) by mouth every 8 (eight) hours. 21 capsule 0   Blood Glucose  Monitoring Suppl (TRUE METRIX METER) w/Device KIT 1 kit by Does not apply route 3 (three) times daily as needed. 1 kit 0   chlorpheniramine-HYDROcodone (TUSSIONEX PENNKINETIC ER) 10-8 MG/5ML SUER Take 5 mLs by mouth every 12 (twelve) hours as needed for cough. 115 mL 0   lisinopril-hydrochlorothiazide (ZESTORETIC) 20-25 MG tablet TAKE 1 TABLET BY MOUTH DAILY. 90 tablet 3   No current facility-administered medications on file prior to visit.    ROS Comprehensive ROS negative and positive noted in HPI  Objective:  BP 129/87 (BP Location: Right Arm, Patient Position: Sitting, Cuff Size: Normal)   Pulse 87   Temp (!) 97.3 F (36.3 C) (Temporal)   Ht _0  (1.727 m)   Wt 218 lb 3.2 oz (99 kg)   LMP 06/29/2010   SpO2 94%   BMI 33.18 kg/m   BP Readings from Last 3 Encounters:  03/28/21 129/87  03/09/21 126/84  02/22/21 125/85    Wt Readings from Last 3 Encounters:  03/28/21 218 lb 3.2 oz (99 kg)  03/09/21 223 lb (101.2 kg)  02/22/21 228 lb 6.4 oz (103.6 kg)    Physical Exam General: No apparent distress. Eyes: Extraocular eye movements intact, pupils equal and round. Neck: Supple, trachea midline. Thyroid: No enlargement, mobile without fixation, no tenderness. Cardiovascular: Regular rhythm and rate, no murmur, normal radial pulses. Respiratory: Normal respiratory effort, clear to auscultation. Gastrointestinal: Normal pitch active bowel sounds, nontender abdomen without distention or appreciable hepatomegaly. Neurologic: A&O Musculoskeletal: Normal muscle tone, no tenderness on palpation of tibia, no excessive thoracic kyphosis. Skin: Appropriate warmth, no  visible rash. Mental status: Alert, conversant, speech clear, thought logical, appropriate mood and affect, no hallucinations or delusions evident. Hematologic/lymphatic: No cervical adenopathy, no visible ecchymoses.  Lab Results  Component Value Date   HGBA1C 6.8 (A) 03/28/2021   HGBA1C 7.6 (A) 12/20/2020   HGBA1C  6.1 (A) 08/13/2019    Lab Results  Component Value Date   WBC 4.1 12/20/2020   HGB 14.4 12/20/2020   HCT 40.7 12/20/2020   PLT 313 12/20/2020   GLUCOSE 192 (H) 12/20/2020   CHOL 179 12/20/2020   TRIG 90 12/20/2020   HDL 45 12/20/2020   LDLCALC 117 (H) 12/20/2020   ALT 81 (H) 12/20/2020   AST 81 (H) 12/20/2020   NA 137 12/20/2020   K 3.9 12/20/2020   CL 97 12/20/2020   CREATININE 0.80 12/20/2020   BUN 11 12/20/2020   CO2 23 12/20/2020   TSH 1.660 07/29/2017   INR 1.01 09/18/2010   HGBA1C 6.8 (A) 03/28/2021     Assessment & Plan:  Sheryl [name] was seen today for hypertension and diabetes.  Diagnoses and all orders for this visit:  Colon cancer screening -     Ambulatory referral to Gastroenterology.  History of polyps.  Type 2 diabetes mellitus without complication, without long-term current use of insulin (HCC) -     HgB A1c 6.8.  Patient admits to not taking any medication or diabetes.  A1c despite not taking medication has gone down from 7.6.  We discussed monitoring carbohydrates which she does and will take metformin XR 500 daily Hypertension, unspecified type blood pressure is close to goal of less than 130/80, today's reading is 129/87 she will continue low-sodium, DASH diet, medication compliance, 150 minutes of moderate intensity exercise per week. Discussed medication compliance, adverse effects.   Gastroesophageal reflux disease without esophagitis Discussed eating small frequent meal, reduction in acidic foods, fried foods ,spicy foods, alcohol caffeine and tobacco and certain medications. Avoid laying down after eating 69mns-1hour, elevated head of the bed.  -     lansoprazole (PREVACID) 30 MG capsule; TAKE 1 CAPSULE (30 MG TOTAL) BY MOUTH DAILY AT 12 NOON.    I have discontinued Sheryl [name]'s hydrochlorothiazide, metFORMIN, and glimepiride. I am also having Sheryl [name] maintain Sheryl [name] True Metrix Meter, amLODipine, lisinopril-hydrochlorothiazide, atorvastatin,  benzonatate, chlorpheniramine-HYDROcodone, and lansoprazole.  Meds ordered this encounter  Medications   lansoprazole (PREVACID) 30 MG capsule    Sig: TAKE 1 CAPSULE (30 MG TOTAL) BY MOUTH DAILY AT 12 NOON.    Dispense:  30 capsule    Refill:  1     Follow-up:   The above assessment and management plan was discussed with the patient. The patient verbalized understanding of and has agreed to the management plan. Patient is aware to call the clinic if symptoms fail to improve or worsen. Patient is aware when to return to the clinic for a follow-up visit. Patient educated on when it is appropriate to go to the emergency department.   MJuluis Mire NP-C

## 2021-03-28 NOTE — Patient Instructions (Signed)
Why should you not take PPIs long-term? Long-term PPI (omeprazole, Protonix, Nexium ) use has been associated with an increased risk of several adverse outcomes in observational studies, including bone fractures, vitamin B12, magnesium or iron deficiency, Clostridium difficile infection and community-acquired pneumonia.

## 2021-04-04 ENCOUNTER — Other Ambulatory Visit: Payer: Self-pay

## 2021-04-27 ENCOUNTER — Other Ambulatory Visit (HOSPITAL_BASED_OUTPATIENT_CLINIC_OR_DEPARTMENT_OTHER): Payer: Self-pay

## 2021-04-27 ENCOUNTER — Emergency Department (HOSPITAL_BASED_OUTPATIENT_CLINIC_OR_DEPARTMENT_OTHER): Payer: 59

## 2021-04-27 ENCOUNTER — Emergency Department (HOSPITAL_BASED_OUTPATIENT_CLINIC_OR_DEPARTMENT_OTHER)
Admission: EM | Admit: 2021-04-27 | Discharge: 2021-04-27 | Disposition: A | Discharge status: Home / Self Care | Payer: 59 | Attending: Emergency Medicine | Admitting: Emergency Medicine

## 2021-04-27 ENCOUNTER — Encounter (HOSPITAL_BASED_OUTPATIENT_CLINIC_OR_DEPARTMENT_OTHER): Payer: Self-pay

## 2021-04-27 ENCOUNTER — Other Ambulatory Visit: Payer: Self-pay

## 2021-04-27 DIAGNOSIS — R059 Cough, unspecified: Secondary | ICD-10-CM | POA: Diagnosis present

## 2021-04-27 DIAGNOSIS — U071 COVID-19: Secondary | ICD-10-CM | POA: Diagnosis not present

## 2021-04-27 DIAGNOSIS — E119 Type 2 diabetes mellitus without complications: Secondary | ICD-10-CM | POA: Diagnosis not present

## 2021-04-27 DIAGNOSIS — I10 Essential (primary) hypertension: Secondary | ICD-10-CM | POA: Insufficient documentation

## 2021-04-27 DIAGNOSIS — K429 Umbilical hernia without obstruction or gangrene: Secondary | ICD-10-CM | POA: Insufficient documentation

## 2021-04-27 DIAGNOSIS — Z7984 Long term (current) use of oral hypoglycemic drugs: Secondary | ICD-10-CM | POA: Insufficient documentation

## 2021-04-27 DIAGNOSIS — R051 Acute cough: Secondary | ICD-10-CM

## 2021-04-27 DIAGNOSIS — Z79899 Other long term (current) drug therapy: Secondary | ICD-10-CM | POA: Insufficient documentation

## 2021-04-27 MED ORDER — HYDROCOD POLST-CPM POLST ER 10-8 MG/5ML PO SUER
5.0000 mL | Freq: Two times a day (BID) | ORAL | 0 refills | Status: DC | PRN
  Filled 2021-04-27: qty 115, 12d supply, fill #0

## 2021-04-27 MED ORDER — ALBUTEROL SULFATE HFA 108 (90 BASE) MCG/ACT IN AERS
2.0000 | INHALATION_SPRAY | Freq: Once | RESPIRATORY_TRACT | Status: AC
  Administered 2021-04-27: 10:00:00 2 via RESPIRATORY_TRACT
  Filled 2021-04-27: qty 6.7

## 2021-04-27 NOTE — ED Provider Notes (Signed)
[facility] EMERGENCY DEPARTMENT Provider Note   CSN: [id number] Arrival date & time: 04/27/21  0920     History Chief Complaint  Patient presents with   Cough    Sheryl [name] is a 55 y.o. female.  She is here with a complaint of a cough its been going on for a few weeks.  Nonproductive.  Worse at night.  She said she is coughing so hard that it was making her hernia hurt.  She was here a few months ago for a cough and was prescribed Tussionex which helped her symptoms at that time.  She denies smoking tobacco.  No fevers chills nausea vomiting.  No chest pain.  She said she did positive for COVID about 10 days ago.  The history is provided by the patient.  Cough Cough characteristics:  Non-productive Sputum characteristics:  Nondescript Severity:  Moderate Onset quality:  Gradual Duration:  2 weeks Timing:  Intermittent Progression:  Unchanged Chronicity:  Recurrent Smoker: no   Relieved by:  Nothing Worsened by:  Deep breathing and lying down Ineffective treatments:  None tried Associated symptoms: no chest pain, no chills, no fever, no headaches, no myalgias, no rash, no shortness of breath and no sore throat   Risk factors: recent infection       Past Medical History:  Diagnosis Date   Blood transfusion without reported diagnosis    had transfusion with hysterectomy   Diabetes ([place]) 03/13/2021   Fibroid    H/O blood clots    History of hysterectomy    fibroids and heavy cycles   Hypertension     Patient Active Problem List   Diagnosis Date Noted   Diabetes ([place]) 05/21/2019    Past Surgical History:  Procedure Laterality Date   ABDOMINAL HYSTERECTOMY       OB History     Gravida  3   Para  2   Term      Preterm      AB  1   Living  2      SAB      IAB  1   Ectopic      Multiple      Live Births              Family History  Problem Relation Age of Onset   Stroke Mother    Diabetes Mother    Hypertension  Mother    Stroke Father    Heart attack Maternal Grandmother    Breast cancer Paternal Grandmother    Diabetes Other    Hypertension Other    Stroke Other    Cancer Other    Heart attack Other     Social History   Tobacco Use   Smoking status: Never   Smokeless tobacco: Never  Vaping Use   Vaping Use: Never used  Substance Use Topics   Alcohol use: No   Drug use: No    Home Medications Prior to Admission medications   Medication Sig Start Date End Date Taking? Authorizing Provider  amLODipine (NORVASC) 10 MG tablet TAKE 1 TABLET (10 MG TOTAL) BY MOUTH DAILY. 12/20/20 12/20/21  Kerin Perna, NP  atorvastatin (LIPITOR) 20 MG tablet Take 1 tablet (20 mg total) by mouth daily. 12/22/20   Kerin Perna, NP  benzonatate (TESSALON) 100 MG capsule Take 1 capsule (100 mg total) by mouth every 8 (eight) hours. 01/28/21   Sherrill Raring, PA-C  Blood Glucose Monitoring Suppl (TRUE METRIX METER) w/Device  KIT 1 kit by Does not apply route 3 (three) times daily as needed. 05/21/19   Kerin Perna, NP  chlorpheniramine-HYDROcodone (TUSSIONEX PENNKINETIC ER) 10-8 MG/5ML SUER Take 5 mLs by mouth every 12 (twelve) hours as needed for cough. 02/22/21   Kerin Perna, NP  lansoprazole (PREVACID) 30 MG capsule TAKE 1 CAPSULE (30 MG TOTAL) BY MOUTH DAILY AT 12 NOON. 03/28/21 03/28/22  Kerin Perna, NP  lisinopril-hydrochlorothiazide (ZESTORETIC) 20-25 MG tablet TAKE 1 TABLET BY MOUTH DAILY. 12/20/20 12/20/21  Kerin Perna, NP  metFORMIN (GLUCOPHAGE XR) 500 MG 24 hr tablet Take 1 tablet (500 mg total) by mouth daily with breakfast. 03/28/21   Kerin Perna, NP    Allergies    Aspirin, Cyclobenzaprine, Naproxen sodium, Zithromax [azithromycin dihydrate], and Dilaudid [hydromorphone]  Review of Systems   Review of Systems  Constitutional:  Negative for chills and fever.  HENT:  Negative for sore throat.   Eyes:  Negative for visual disturbance.  Respiratory:   Positive for cough. Negative for shortness of breath.   Cardiovascular:  Negative for chest pain.  Gastrointestinal:  Positive for abdominal pain. Negative for nausea and vomiting.  Genitourinary:  Negative for dysuria.  Musculoskeletal:  Negative for myalgias.  Skin:  Negative for rash.  Neurological:  Negative for headaches.   Physical Exam Updated Vital Signs BP (!) 135/91    Pulse 93    Temp 98.3 F (36.8 C) (Oral)    Resp 18    Ht 5' 8"  (1.727 m)    Wt 99 kg    LMP 06/29/2010    SpO2 99%    BMI 33.19 kg/m   Physical Exam Vitals and nursing note reviewed.  Constitutional:      General: She is not in acute distress.    Appearance: Normal appearance. She is well-developed.  HENT:     Head: Normocephalic and atraumatic.  Eyes:     Conjunctiva/sclera: Conjunctivae normal.  Cardiovascular:     Rate and Rhythm: Normal rate and regular rhythm.     Heart sounds: No murmur heard. Pulmonary:     Effort: Pulmonary effort is normal. No respiratory distress.     Breath sounds: Normal breath sounds.  Abdominal:     Palpations: Abdomen is soft.     Tenderness: There is no abdominal tenderness. There is no guarding or rebound.     Hernia: No hernia is present.  Musculoskeletal:        General: No swelling.     Cervical back: Neck supple.     Right lower leg: No edema.     Left lower leg: No edema.  Skin:    General: Skin is warm and dry.     Capillary Refill: Capillary refill takes less than 2 seconds.  Neurological:     General: No focal deficit present.     Mental Status: She is alert.  Psychiatric:        Mood and Affect: Mood normal.    ED Results / Procedures / Treatments   Labs (all labs ordered are listed, but only abnormal results are displayed) Labs Reviewed - No data to display  EKG None  Radiology DG Chest [facility] 1 View  Result Date: 04/27/2021 CLINICAL DATA:  Cough. EXAM: PORTABLE CHEST 1 VIEW COMPARISON:  January 28, 2021. FINDINGS: The heart size and  mediastinal contours are within normal limits. Both lungs are clear. The visualized skeletal structures are unremarkable. IMPRESSION: No active disease. Electronically Signed   By:  Marijo Conception M.D.   On: 04/27/2021 10:06    Procedures Procedures   Medications Ordered in ED Medications  albuterol (VENTOLIN HFA) 108 (90 Base) MCG/ACT inhaler 2 puff (has no administration in time range)    ED Course  I have reviewed the triage vital signs and the nursing notes.  Pertinent labs & imaging results that were available during my care of the patient were reviewed by me and considered in my medical decision making (see chart for details).  Clinical Course as of 04/27/21 1718  Thu Apr 27, 2021  1008 Chest x-ray interpreted by me as no acute infiltrates.  Awaiting radiology reading. [MB]    Clinical Course User Index [MB] Hayden Rasmussen, MD   MDM Rules/Calculators/A&P                         Sheryl [name] was evaluated in Emergency Department on 04/27/2021 for the symptoms described in the history of present illness. She was evaluated in the context of the global COVID-19 pandemic, which necessitated consideration that the patient might be at risk for infection with the SARS-CoV-2 virus that causes COVID-19. Institutional protocols and algorithms that pertain to the evaluation of patients at risk for COVID-19 are in a state of rapid change based on information released by regulatory bodies including the CDC and federal and state organizations. These policies and algorithms were followed during the patient's care in the ED.   55 year old female with recent COVID infection here complaining of cough and umbilical hernia pain.  Differential includes post-COVID cough, pneumonia, bronchitis, incarcerated hernia, also wall strain.  Chest x-ray ordered and interpreted by me as no acute infiltrates.  Nominal exam benign so do not feel needs abdominal imaging at this time.  Will cover prescription  cough medicine for patient's symptoms at her request. Close follow-up with PCP and return instructions discussed  Final Clinical Impression(s) / ED Diagnoses Final diagnoses:  Acute cough  COVID-19 virus infection  Umbilical hernia without obstruction and without gangrene    Rx / DC Orders ED Discharge Orders          Ordered    chlorpheniramine-HYDROcodone (TUSSIONEX PENNKINETIC ER) 10-8 MG/5ML SUER  Every 12 hours PRN        04/27/21 1014             Hayden Rasmussen, MD 04/27/21 1719

## 2021-04-27 NOTE — Discharge Instructions (Addendum)
You were seen in the emergency department for continued cough after recent COVID infection.  Your chest x-ray did not show any pneumonia.  We are prescribing you a cough medication.  You can also use the albuterol inhaler 2 puffs every 4 hours as needed for wheezing.  Follow-up with your primary care doctor.  You also should talk to your doctor about a referral to surgery regarding your hernia.

## 2021-04-27 NOTE — ED Triage Notes (Addendum)
C/o cough x 1 month. Was diagnosed with bronchitis when symptoms started. States also has an umbilical hernia that when she coughs it hurts. Denies shortness of breath/chest pain. NAD during triage. Positive covid test on 12/21

## 2021-05-11 ENCOUNTER — Other Ambulatory Visit: Payer: Self-pay

## 2021-05-12 ENCOUNTER — Other Ambulatory Visit: Payer: Self-pay

## 2021-06-20 ENCOUNTER — Other Ambulatory Visit: Payer: Self-pay

## 2021-06-21 ENCOUNTER — Other Ambulatory Visit: Payer: Self-pay

## 2021-06-22 ENCOUNTER — Ambulatory Visit (INDEPENDENT_AMBULATORY_CARE_PROVIDER_SITE_OTHER): Payer: 59 | Admitting: Primary Care

## 2021-06-29 ENCOUNTER — Encounter (INDEPENDENT_AMBULATORY_CARE_PROVIDER_SITE_OTHER): Payer: Self-pay | Admitting: Primary Care

## 2021-06-29 ENCOUNTER — Other Ambulatory Visit: Payer: Self-pay

## 2021-06-29 ENCOUNTER — Ambulatory Visit (INDEPENDENT_AMBULATORY_CARE_PROVIDER_SITE_OTHER): Payer: 59 | Admitting: Primary Care

## 2021-06-29 VITALS — BP 118/86 | HR 85 | Temp 98.0°F | Ht 68.0 in | Wt 219.4 lb

## 2021-06-29 DIAGNOSIS — E782 Mixed hyperlipidemia: Secondary | ICD-10-CM | POA: Diagnosis not present

## 2021-06-29 DIAGNOSIS — Z23 Encounter for immunization: Secondary | ICD-10-CM

## 2021-06-29 DIAGNOSIS — I10 Essential (primary) hypertension: Secondary | ICD-10-CM

## 2021-06-29 DIAGNOSIS — E119 Type 2 diabetes mellitus without complications: Secondary | ICD-10-CM

## 2021-06-29 DIAGNOSIS — Z760 Encounter for issue of repeat prescription: Secondary | ICD-10-CM

## 2021-06-29 LAB — POCT GLYCOSYLATED HEMOGLOBIN (HGB A1C): Hemoglobin A1C: 6.6 % — AB (ref 4.0–5.6)

## 2021-06-29 MED ORDER — LISINOPRIL-HYDROCHLOROTHIAZIDE 20-25 MG PO TABS
1.0000 | ORAL_TABLET | Freq: Every day | ORAL | 3 refills | Status: DC
  Filled 2021-06-29: qty 90, 90d supply, fill #0
  Filled 2021-07-21: qty 30, 30d supply, fill #0
  Filled 2021-08-22: qty 30, 30d supply, fill #1
  Filled 2021-09-27: qty 30, 30d supply, fill #2
  Filled 2021-10-23: qty 90, 90d supply, fill #3

## 2021-06-29 MED ORDER — AMLODIPINE BESYLATE 10 MG PO TABS
ORAL_TABLET | Freq: Every day | ORAL | 1 refills | Status: DC
  Filled 2021-06-29: qty 90, fill #0
  Filled 2021-07-21: qty 30, 30d supply, fill #0
  Filled 2021-07-21: qty 90, 90d supply, fill #0
  Filled 2021-10-23: qty 90, 90d supply, fill #1

## 2021-06-29 NOTE — Progress Notes (Signed)
?[facility] ? ? ?[name] is a 56 y.o. female presents for hypertension evaluation, Denies shortness of breath, headaches, chest pain or lower extremity edema, sudden onset, vision changes, unilateral weakness, dizziness, paresthesias  ? ?Patient reports adherence with medications. ? ?Dietary habits include: sodium and carb restrictions ?Exercise habits include:walking ?Family / Social history: DM, HTN, CVA ? ? ?Past Medical History:  ?Diagnosis Date  ? Blood transfusion without reported diagnosis   ? had transfusion with hysterectomy  ? Diabetes ([place]) 03/13/2021  ? Fibroid   ? H/O blood clots   ? History of hysterectomy   ? fibroids and heavy cycles  ? Hypertension   ? ?Past Surgical History:  ?Procedure Laterality Date  ? ABDOMINAL HYSTERECTOMY    ? ?Allergies  ?Allergen Reactions  ? Aspirin Anaphylaxis  ? Cyclobenzaprine Anaphylaxis  ? Naproxen Sodium Anaphylaxis  ? Zithromax [Azithromycin Dihydrate] Anaphylaxis  ? Dilaudid [Hydromorphone] Nausea And Vomiting  ? ?Current Outpatient Medications on File Prior to Visit  ?Medication Sig Dispense Refill  ? amLODipine (NORVASC) 10 MG tablet TAKE 1 TABLET (10 MG TOTAL) BY MOUTH DAILY. 90 tablet 1  ? atorvastatin (LIPITOR) 20 MG tablet Take 1 tablet (20 mg total) by mouth daily. 90 tablet 3  ? benzonatate (TESSALON) 100 MG capsule Take 1 capsule (100 mg total) by mouth every 8 (eight) hours. 21 capsule 0  ? Blood Glucose Monitoring Suppl (TRUE METRIX METER) w/Device KIT 1 kit by Does not apply route 3 (three) times daily as needed. 1 kit 0  ? chlorpheniramine-HYDROcodone (TUSSIONEX PENNKINETIC ER) 10-8 MG/5ML SUER Take 5 mLs by mouth every 12 (twelve) hours as needed for cough. 115 mL 0  ? lansoprazole (PREVACID) 30 MG capsule TAKE 1 CAPSULE (30 MG TOTAL) BY MOUTH DAILY AT 12 NOON. 30 capsule 1  ? lisinopril-hydrochlorothiazide (ZESTORETIC) 20-25 MG tablet TAKE 1 TABLET BY MOUTH DAILY. 90 tablet 3  ? metFORMIN (GLUCOPHAGE XR) 500 MG 24 hr  tablet Take 1 tablet (500 mg total) by mouth daily with breakfast. 90 tablet 1  ? ?No current facility-administered medications on file prior to visit.  ? ?Social History  ? ?Socioeconomic History  ? Marital status: Widowed  ?  Spouse name: Not on file  ? Number of children: Not on file  ? Years of education: Not on file  ? Highest education level: Not on file  ?Occupational History  ? Not on file  ?Tobacco Use  ? Smoking status: Never  ? Smokeless tobacco: Never  ?Vaping Use  ? Vaping Use: Never used  ?Substance and Sexual Activity  ? Alcohol use: No  ? Drug use: No  ? Sexual activity: Yes  ?  Birth control/protection: Surgical  ?  Comment: Hyst  ?Other Topics Concern  ? Not on file  ?Social History Narrative  ? Not on file  ? ?Social Determinants of Health  ? ?Financial Resource Strain: Not on file  ?Food Insecurity: Not on file  ?Transportation Needs: Not on file  ?Physical Activity: Not on file  ?Stress: Not on file  ?Social Connections: Not on file  ?Intimate Partner Violence: Not on file  ? ?Family History  ?Problem Relation Age of Onset  ? Stroke Mother   ? Diabetes Mother   ? Hypertension Mother   ? Stroke Father   ? Heart attack Maternal Grandmother   ? Breast cancer Paternal Grandmother   ? Diabetes Other   ? Hypertension Other   ? Stroke Other   ? Cancer Other   ?  Heart attack Other   ? ? ? ?OBJECTIVE: ? ?There were no vitals filed for this visit. ? ?Physical Exam ?Physical exam: ?General: Vital signs reviewed.  Patient is well-developed and well-nourished, obese female in no acute distress and cooperative with exam. ?Head: Normocephalic and atraumatic. ?Eyes: EOMI, conjunctivae normal, no scleral icterus. ?Neck: Supple, trachea midline, normal ROM, no JVD, masses, thyromegaly, or carotid bruit present. ?Cardiovascular: RRR, S1 normal, S2 normal, no murmurs, gallops, or rubs. ?Pulmonary/Chest: Clear to auscultation bilaterally, no wheezes, rales, or rhonchi. ?Abdominal: Soft, non-tender, non-distended, BS  +, no masses, organomegaly, or guarding present. ?Musculoskeletal: No joint deformities, erythema, or stiffness, ROM full and nontender. ?Extremities: No lower extremity edema bilaterally,  pulses symmetric and intact bilaterally. No cyanosis or clubbing. ?Neurological: A&O x3, Strength is normal ?Skin: Warm, dry and intact. No rashes or erythema. ?Psychiatric: Normal mood and affect. speech and behavior is normal. Cognition and memory are normal. ?   ?ROS ?Comprehensive ROS Pertinent positive and negative noted in HPI   ? ?Last 3 Office BP readings: ?BP Readings from Last 3 Encounters:  ?04/27/21 125/84  ?03/28/21 129/87  ?03/09/21 126/84  ? ? ?BMET ?   ?Component Value Date/Time  ? NA 137 12/20/2020 0957  ? K 3.9 12/20/2020 0957  ? CL 97 12/20/2020 0957  ? CO2 23 12/20/2020 0957  ? GLUCOSE 192 (H) 12/20/2020 0957  ? GLUCOSE 137 (H) 06/12/2020 2993  ? BUN 11 12/20/2020 0957  ? CREATININE 0.80 12/20/2020 0957  ? CALCIUM 10.0 12/20/2020 0957  ? GFRNONAA >60 06/12/2020 7169  ? GFRAA 106 03/31/2020 1036  ? ? ?Renal function: ?CrCl cannot be calculated (Patient's most recent lab result is older than the maximum 21 days allowed.). ? ?Clinical ASCVD: Yes  ?The 10-year ASCVD risk score (Arnett DK, et al., 2019) is: 12.8% ?  Values used to calculate the score: ?    Age: 21 years ?    Sex: Female ?    Is Non-Hispanic African American: Yes ?    Diabetic: Yes ?    Tobacco smoker: No ?    Systolic Blood Pressure: 678 mmHg ?    Is BP treated: Yes ?    HDL Cholesterol: 45 mg/dL ?    Total Cholesterol: 179 mg/dL ? ?ASCVD risk factors include- Sheryl [name] ?: ?Sheryl [name] was seen today for blood pressure check and diabetes. ? ?Diagnoses and all orders for this visit: ? ?Need for shingles vaccine ?-     Varicella-zoster vaccine IM (Shingrix) ? ?Type 2 diabetes mellitus without complication, without long-term current use of insulin ([place]) ?-     HgB A1c 6.6  ?Continue metformin 557m XR daily.Well controlled . Monitoring foods that are high in  carbohydrates are the following rice, potatoes, breads, sugars, and pastas.  Reduction in the intake (eating) will assist in lowering your blood sugars.  ? ?Blood pressure goal of less than 130/80, is met low-sodium, DASH diet, medication compliance, 150 minutes of moderate intensity exercise per week. ?Discussed medication compliance, adverse effects.  ?-     Medication refill ?-     amLODipine (NORVASC) 10 MG tablet; TAKE 1 TABLET (10 MG TOTAL) BY MOUTH DAILY. ?-     lisinopril-hydrochlorothiazide (ZESTORETIC) 20-25 MG tablet; TAKE 1 TABLET BY MOUTH DAILY. ? ?Mixed hyperlipidemia ? Healthy lifestyle diet of fruits vegetables fish nuts whole grains and low saturated fat . Foods high in cholesterol or liver, fatty meats,cheese, butter avocados, nuts and seeds, chocolate and fried foods. ? ?  ?Hypertension, unspecified  type ?-Counseled on modifications for blood pressure control including reduced dietary sodium, increased exercise, weight reduction and adequate sleep. Also, educated patient about the risk for cardiovascular events, stroke and heart attack. Also counseled patient about the importance of medication adherence. If you participate in smoking, it is important to stop using tobacco as this will increase the risks associated with uncontrolled blood pressure.  ? ?-Hypertension longstanding diagnosed currently amLODipine (NORVASC) 10 MG tablet; TAKE 1 TABLET (10 MG TOTAL) BY MOUTH DAILY and lisinopril-hydrochlorothiazide (ZESTORETIC) 20-25 MG tablet; TAKE 1 TABLET BY MOUTH DAILY.on current medications. Patient  adherent with current medications.  ?is ?Goal BP:  ?For patients younger than 60: Goal BP < 130/80. ?For patients 60 and older: Goal BP < 140/90. ?For patients with diabetes: Goal BP < 130/80. ?Your most recent BP: 118/86 ? ?Minimize salt intake. ?Minimize alcohol intake ? ? ? ?This note has been created with Surveyor, quantity. Any transcriptional errors are  unintentional.  ? ?Kerin Perna, NP ?06/29/2021, 10:39 AM ?  ?

## 2021-07-21 ENCOUNTER — Other Ambulatory Visit: Payer: Self-pay

## 2021-08-22 ENCOUNTER — Other Ambulatory Visit (INDEPENDENT_AMBULATORY_CARE_PROVIDER_SITE_OTHER): Payer: Self-pay | Admitting: Primary Care

## 2021-08-22 ENCOUNTER — Other Ambulatory Visit: Payer: Self-pay

## 2021-08-22 DIAGNOSIS — K219 Gastro-esophageal reflux disease without esophagitis: Secondary | ICD-10-CM

## 2021-08-22 NOTE — Telephone Encounter (Signed)
Request routed to PCP ?

## 2021-08-24 ENCOUNTER — Other Ambulatory Visit: Payer: Self-pay

## 2021-08-24 MED ORDER — LANSOPRAZOLE 30 MG PO CPDR
DELAYED_RELEASE_CAPSULE | ORAL | 1 refills | Status: DC
  Filled 2021-08-24: qty 30, 30d supply, fill #0
  Filled 2021-09-27: qty 30, 30d supply, fill #1

## 2021-09-27 ENCOUNTER — Other Ambulatory Visit: Payer: Self-pay

## 2021-09-28 ENCOUNTER — Other Ambulatory Visit: Payer: Self-pay

## 2021-09-29 ENCOUNTER — Other Ambulatory Visit: Payer: Self-pay

## 2021-10-23 ENCOUNTER — Other Ambulatory Visit: Payer: Self-pay

## 2021-11-17 ENCOUNTER — Other Ambulatory Visit: Payer: Self-pay

## 2021-11-17 ENCOUNTER — Other Ambulatory Visit (INDEPENDENT_AMBULATORY_CARE_PROVIDER_SITE_OTHER): Payer: Self-pay | Admitting: Primary Care

## 2021-11-17 DIAGNOSIS — K219 Gastro-esophageal reflux disease without esophagitis: Secondary | ICD-10-CM

## 2021-11-17 MED ORDER — LANSOPRAZOLE 30 MG PO CPDR
DELAYED_RELEASE_CAPSULE | ORAL | 1 refills | Status: DC
Start: 2021-11-17 — End: 2022-01-23
  Filled 2021-11-17: qty 30, 30d supply, fill #0
  Filled 2022-01-02: qty 30, 30d supply, fill #1

## 2021-11-20 ENCOUNTER — Other Ambulatory Visit: Payer: Self-pay

## 2021-12-05 ENCOUNTER — Emergency Department (HOSPITAL_BASED_OUTPATIENT_CLINIC_OR_DEPARTMENT_OTHER): Payer: Commercial Managed Care - HMO

## 2021-12-05 ENCOUNTER — Other Ambulatory Visit: Payer: Self-pay

## 2021-12-05 ENCOUNTER — Emergency Department (HOSPITAL_BASED_OUTPATIENT_CLINIC_OR_DEPARTMENT_OTHER)
Admission: EM | Admit: 2021-12-05 | Discharge: 2021-12-05 | Disposition: A | Discharge status: Home / Self Care | Payer: Commercial Managed Care - HMO | Attending: Emergency Medicine | Admitting: Emergency Medicine

## 2021-12-05 ENCOUNTER — Encounter (HOSPITAL_BASED_OUTPATIENT_CLINIC_OR_DEPARTMENT_OTHER): Payer: Self-pay | Admitting: Emergency Medicine

## 2021-12-05 DIAGNOSIS — R1013 Epigastric pain: Secondary | ICD-10-CM | POA: Diagnosis not present

## 2021-12-05 DIAGNOSIS — Z79899 Other long term (current) drug therapy: Secondary | ICD-10-CM | POA: Diagnosis not present

## 2021-12-05 DIAGNOSIS — R109 Unspecified abdominal pain: Secondary | ICD-10-CM | POA: Diagnosis present

## 2021-12-05 LAB — COMPREHENSIVE METABOLIC PANEL
ALT: 49 U/L — ABNORMAL HIGH (ref 0–44)
AST: 50 U/L — ABNORMAL HIGH (ref 15–41)
Albumin: 4.3 g/dL (ref 3.5–5.0)
Alkaline Phosphatase: 80 U/L (ref 38–126)
Anion gap: 9 (ref 5–15)
BUN: 17 mg/dL (ref 6–20)
CO2: 26 mmol/L (ref 22–32)
Calcium: 9.2 mg/dL (ref 8.9–10.3)
Chloride: 99 mmol/L (ref 98–111)
Creatinine, Ser: 0.79 mg/dL (ref 0.44–1.00)
GFR, Estimated: >60 mL/min (ref >60)
Glucose, Bld: 128 mg/dL — ABNORMAL HIGH (ref 70–99)
Potassium: 3.3 mmol/L — ABNORMAL LOW (ref 3.5–5.1)
Sodium: 134 mmol/L — ABNORMAL LOW (ref 135–145)
Total Bilirubin: 1 mg/dL (ref 0.3–1.2)
Total Protein: 7.5 g/dL (ref 6.5–8.1)

## 2021-12-05 LAB — CBC
HCT: 38.2 % (ref 36.0–46.0)
Hemoglobin: 13.7 g/dL (ref 12.0–15.0)
MCH: 30 pg (ref 26.0–34.0)
MCHC: 35.9 g/dL (ref 30.0–36.0)
MCV: 83.8 fL (ref 80.0–100.0)
Platelets: 313 10*3/uL (ref 150–400)
RBC: 4.56 MIL/uL (ref 3.87–5.11)
RDW: 13 % (ref 11.5–15.5)
WBC: 5.2 10*3/uL (ref 4.0–10.5)
nRBC: 0 % (ref 0.0–0.2)

## 2021-12-05 LAB — URINALYSIS, ROUTINE W REFLEX MICROSCOPIC
Bilirubin Urine: NEGATIVE
Glucose, UA: NEGATIVE mg/dL
Hgb urine dipstick: NEGATIVE
Ketones, ur: NEGATIVE mg/dL
Leukocytes,Ua: NEGATIVE
Nitrite: NEGATIVE
Protein, ur: NEGATIVE mg/dL
Specific Gravity, Urine: 1.02 (ref 1.005–1.030)
pH: 5.5 (ref 5.0–8.0)

## 2021-12-05 LAB — PREGNANCY, URINE: Preg Test, Ur: NEGATIVE

## 2021-12-05 LAB — LIPASE, BLOOD: Lipase: 44 U/L (ref 11–51)

## 2021-12-05 MED ORDER — ONDANSETRON HCL 4 MG/2ML IJ SOLN
4.0000 mg | Freq: Once | INTRAMUSCULAR | Status: AC
  Administered 2021-12-05: 4 mg via INTRAVENOUS
  Filled 2021-12-05: qty 2

## 2021-12-05 MED ORDER — ONDANSETRON 4 MG PO TBDP: 4.0000 mg | ORAL_TABLET | Freq: Three times a day (TID) | ORAL | 1 refills | Status: DC | PRN

## 2021-12-05 MED ORDER — IOHEXOL 300 MG/ML  SOLN
100.0000 mL | Freq: Once | INTRAMUSCULAR | Status: AC | PRN
  Administered 2021-12-05: 100 mL via INTRAVENOUS

## 2021-12-05 MED ORDER — SODIUM CHLORIDE 0.9 % IV SOLN: INTRAVENOUS | Status: DC

## 2021-12-05 MED ORDER — SODIUM CHLORIDE 0.9 % IV BOLUS
500.0000 mL | Freq: Once | INTRAVENOUS | Status: AC
  Administered 2021-12-05: 500 mL via INTRAVENOUS

## 2021-12-05 NOTE — ED Provider Notes (Signed)
[facility] Provider Note   CSN: [id number] Arrival date & time: 12/05/21  1821     History  Chief Complaint  Patient presents with   Abdominal Pain   Emesis    Sheryl [name] is a 56 y.o. female.  Patient presenting with a complaint of epigastric abdominal pain.  Its been ongoing for 2 months.  But he became constant 2 days ago.  Associated with an emesis after every meal.  Patient also with complaint of constipation.  Patient concerned about a hernia being entrapped.  Says she is got a ventral hernia.  States she has had a ventral hernia following's abdominal plastic surgery that she had done in the past.  Patient does have a primary care doctor has follow-up at the end of September.  Past medical history significant for hysterectomy hypertension and diabetes.  Patient is a non-smoker.       Home Medications Prior to Admission medications   Medication Sig Start Date End Date Taking? Authorizing Provider  ondansetron (ZOFRAN-ODT) 4 MG disintegrating tablet Take 1 tablet (4 mg total) by mouth every 8 (eight) hours as needed. 12/05/21  Yes Fredia Sorrow, MD  amLODipine (NORVASC) 10 MG tablet TAKE 1 TABLET (10 MG TOTAL) BY MOUTH DAILY. 06/29/21 06/29/22  Kerin Perna, NP  atorvastatin (LIPITOR) 20 MG tablet Take 1 tablet (20 mg total) by mouth daily. Patient not taking: Reported on 06/29/2021 12/22/20   Kerin Perna, NP  Blood Glucose Monitoring Suppl (TRUE METRIX METER) w/Device KIT 1 kit by Does not apply route 3 (three) times daily as needed. 05/21/19   Kerin Perna, NP  lansoprazole (PREVACID) 30 MG capsule TAKE 1 CAPSULE (30 MG TOTAL) BY MOUTH DAILY AT 12 NOON. 11/17/21 11/17/22  Kerin Perna, NP  lisinopril-hydrochlorothiazide (ZESTORETIC) 20-25 MG tablet TAKE 1 TABLET BY MOUTH DAILY. 06/29/21 06/29/22  Kerin Perna, NP      Allergies    Aspirin, Cyclobenzaprine, Naproxen sodium, Zithromax [azithromycin dihydrate], and  Dilaudid [hydromorphone]    Review of Systems   Review of Systems  Constitutional:  Negative for chills and fever.  HENT:  Negative for ear pain and sore throat.   Eyes:  Negative for pain and visual disturbance.  Respiratory:  Negative for cough and shortness of breath.   Cardiovascular:  Negative for chest pain and palpitations.  Gastrointestinal:  Positive for abdominal pain, constipation, nausea and vomiting.  Genitourinary:  Negative for dysuria and hematuria.  Musculoskeletal:  Negative for arthralgias and back pain.  Skin:  Negative for color change and rash.  Neurological:  Negative for seizures and syncope.  All other systems reviewed and are negative.   Physical Exam Updated Vital Signs BP 132/80 (BP Location: Left Arm)   Pulse 78   Temp 98.3 F (36.8 C) (Oral)   Resp 16   Ht 1.727 m (5' 8" )   Wt 100.7 kg   LMP 06/29/2010   SpO2 97%   BMI 33.75 kg/m  Physical Exam Vitals and nursing note reviewed.  Constitutional:      General: She is not in acute distress.    Appearance: She is well-developed.  HENT:     Head: Normocephalic and atraumatic.  Eyes:     Conjunctiva/sclera: Conjunctivae normal.  Cardiovascular:     Rate and Rhythm: Normal rate and regular rhythm.     Heart sounds: No murmur heard. Pulmonary:     Effort: Pulmonary effort is normal. No respiratory distress.  Breath sounds: Normal breath sounds.  Abdominal:     General: There is no distension.     Palpations: Abdomen is soft.     Tenderness: There is abdominal tenderness in the epigastric area. There is no guarding.     Hernia: A hernia is present. Hernia is present in the ventral area.  Musculoskeletal:        General: No swelling.     Cervical back: Neck supple.  Skin:    General: Skin is warm and dry.     Capillary Refill: Capillary refill takes less than 2 seconds.  Neurological:     General: No focal deficit present.     Mental Status: She is alert and oriented to person, place,  and time.  Psychiatric:        Mood and Affect: Mood normal.     ED Results / Procedures / Treatments   Labs (all labs ordered are listed, but only abnormal results are displayed) Labs Reviewed  COMPREHENSIVE METABOLIC PANEL - Abnormal; Notable for the following components:      Result Value   Sodium 134 (*)    Potassium 3.3 (*)    Glucose, Bld 128 (*)    AST 50 (*)    ALT 49 (*)    All other components within normal limits  LIPASE, BLOOD  CBC  URINALYSIS, ROUTINE W REFLEX MICROSCOPIC  PREGNANCY, URINE    EKG None  Radiology CT Abdomen Pelvis W Contrast  Result Date: 12/05/2021 CLINICAL DATA:  Acute abdominal pain EXAM: CT ABDOMEN AND PELVIS WITH CONTRAST TECHNIQUE: Multidetector CT imaging of the abdomen and pelvis was performed using the standard protocol following bolus administration of intravenous contrast. RADIATION DOSE REDUCTION: This exam was performed according to the departmental dose-optimization program which includes automated exposure control, adjustment of the mA and/or kV according to patient size and/or use of iterative reconstruction technique. CONTRAST:  140m OMNIPAQUE IOHEXOL 300 MG/ML  SOLN COMPARISON:  01/18/2011 FINDINGS: Lower chest: No acute pleural or parenchymal lung disease. Hepatobiliary: No focal liver abnormality is seen. No gallstones, gallbladder wall thickening, or biliary dilatation. Pancreas: Unremarkable. No pancreatic ductal dilatation or surrounding inflammatory changes. Spleen: Normal in size without focal abnormality. Adrenals/Urinary Tract: Multiple simple left renal cysts do not require specific imaging follow-up. Otherwise the kidneys enhance normally. No urinary tract calculi or obstructive uropathy. The adrenals and bladder are unremarkable. Stomach/Bowel: No bowel obstruction or ileus. Normal appendix right lower quadrant. No bowel wall thickening or inflammatory change. Vascular/Lymphatic: No significant vascular findings are present. No  enlarged abdominal or pelvic lymph nodes. 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 This has been stable since 2007. Reproductive: Status post hysterectomy. No adnexal masses. Other: No free fluid or free intraperitoneal gas. Small fat containing supraumbilical midline ventral hernia. No bowel herniation. Musculoskeletal: No acute or destructive bony lesions. Reconstructed images demonstrate no additional findings. IMPRESSION: 1. No acute intra-abdominal or intrapelvic process. 2. Small fat containing supraumbilical midline ventral hernia. 3. 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 Electronically Signed   By: MRanda NgoM.D.   On: 12/05/2021 21:21    Procedures Procedures    Medications Ordered in ED Medications  0.9 %  sodium chloride infusion ( Intravenous New Bag/Given 12/05/21 2051)  sodium chloride 0.9 % bolus 500 mL (0 mLs Intravenous Stopped 12/05/21 2137)  ondansetron (ZOFRAN) injection  4 mg (4 mg Intravenous Given 12/05/21 2051)  iohexol (OMNIPAQUE) 300 MG/ML solution 100 mL (100 mLs Intravenous Contrast Given 12/05/21 2100)    ED Course/ Medical Decision Making/ A&P                           Medical Decision Making Amount and/or Complexity of Data Reviewed Labs: ordered. Radiology: ordered.  Risk Prescription drug management.   CT scan shows no acute findings.  Lipase is 44.  Complete metabolic panel potassium down just a little bit at 3.3 renal function is good with a GFR greater than 60.  Total bili normal alk phos normal.  No anion gap CBC no leukocytosis hemoglobin 13.7 urinalysis negative for urinary tract infection pregnancy test negative.  But patient has had a hysterectomy.  CT scan of the abdomen no acute intra-abdominal or intrapelvic process small fat-containing supraumbilical midline ventral hernia and  a stable soft tissue nodule within the left upper quadrant area.  Work-up shows no acute abdominal process.  Will treat with Zofran.  Patient will continue her Prevacid.  Patient given referral to GI medicine for consideration for upper endoscopy.  Since symptoms are predominantly epigastric Final Clinical Impression(s) / ED Diagnoses Final diagnoses:  Epigastric pain    Rx / DC Orders ED Discharge Orders          Ordered    ondansetron (ZOFRAN-ODT) 4 MG disintegrating tablet  Every 8 hours PRN        12/05/21 2207              Fredia Sorrow, MD 12/05/21 2211

## 2021-12-05 NOTE — ED Triage Notes (Signed)
Intermittent epigastric pain x 2 months , got constant 2 days ago and emesis after every meal. Constipation x 2 days .

## 2021-12-05 NOTE — Discharge Instructions (Addendum)
Continue the Prevacid.  No acute findings.  You do have a small fat-containing hernia but no entrapment of the bowel.  Dr. Paulita Fujita from gastroenterology for follow-up.  Also take the Zofran as needed for nausea vomiting

## 2021-12-29 ENCOUNTER — Other Ambulatory Visit: Payer: Self-pay

## 2021-12-29 ENCOUNTER — Encounter (INDEPENDENT_AMBULATORY_CARE_PROVIDER_SITE_OTHER): Payer: Self-pay | Admitting: Primary Care

## 2021-12-29 ENCOUNTER — Ambulatory Visit (INDEPENDENT_AMBULATORY_CARE_PROVIDER_SITE_OTHER): Payer: Commercial Managed Care - HMO | Admitting: Primary Care

## 2021-12-29 VITALS — BP 114/77 | HR 69 | Temp 98.3°F | Ht 68.0 in | Wt 208.6 lb

## 2021-12-29 DIAGNOSIS — Z760 Encounter for issue of repeat prescription: Secondary | ICD-10-CM

## 2021-12-29 DIAGNOSIS — Z23 Encounter for immunization: Secondary | ICD-10-CM

## 2021-12-29 DIAGNOSIS — E119 Type 2 diabetes mellitus without complications: Secondary | ICD-10-CM

## 2021-12-29 DIAGNOSIS — I10 Essential (primary) hypertension: Secondary | ICD-10-CM

## 2021-12-29 LAB — POCT GLYCOSYLATED HEMOGLOBIN (HGB A1C): Hemoglobin A1C: 6.8 % — AB (ref 4.0–5.6)

## 2021-12-29 MED ORDER — AMLODIPINE BESYLATE 10 MG PO TABS
ORAL_TABLET | Freq: Every day | ORAL | 1 refills | Status: DC
  Filled 2021-12-29: qty 30, 30d supply, fill #0
  Filled 2022-02-23: qty 30, 30d supply, fill #1
  Filled 2022-03-28: qty 30, 30d supply, fill #2
  Filled 2022-04-27 (×2): qty 30, 30d supply, fill #3
  Filled 2022-05-20: qty 30, 30d supply, fill #4
  Filled 2022-06-10: qty 30, 30d supply, fill #5

## 2021-12-29 NOTE — Progress Notes (Unsigned)
Subjective:  Patient ID: Sheryl [name], female    DOB: 08-23-1965  Age: 56 y.o. MRN: [id number]  CC: Follow-up (HTN/DM/Fasting )   HPI [name] presents for follow-up of diabetes. Patient does not check blood sugar at home. Patient is transferring care to a new provider due to not being able to see previous GI due to a balance. The new PCP is able to perform clonic test. She has been having GI issues unable to keep food down especially at night stomach start to hurt emesis begins. Patient has No headache, No chest pain, - No Nausea, No new weakness tingling or numbness, No Cough - shortness of breath   Compliant with meds - Yes Checking CBGs? No  Fasting avg -   Postprandial average -  Exercising regularly? - Yes Watching carbohydrate intake? - Yes Neuropathy ? - No Hypoglycemic events - No  - Recovers with :   Pertinent ROS:  Polyuria - No Polydipsia - No Vision problems - No  Medications as noted below. Taking them regularly without complication/adverse reaction being reported today.   History [name] has a past medical history of Blood transfusion without reported diagnosis, Diabetes (HCC) (03/13/2021), Fibroid, H/O blood clots, History of hysterectomy, and Hypertension.   She has a past surgical history that includes Abdominal hysterectomy.   Her family history includes Breast cancer in her paternal grandmother; Cancer in an other family member; Diabetes in her mother and another family member; Heart attack in her maternal grandmother and another family member; Hypertension in her mother and another family member; Stroke in her father, mother, and another family member.She reports that she has never smoked. She has never used smokeless tobacco. She reports that she does not drink alcohol and does not use drugs.  Current Outpatient Medications on File Prior to Visit  Medication Sig Dispense Refill   amLODipine (NORVASC) 10 MG tablet TAKE 1 TABLET (10 MG TOTAL) BY  MOUTH DAILY. 90 tablet 1   lansoprazole (PREVACID) 30 MG capsule TAKE 1 CAPSULE (30 MG TOTAL) BY MOUTH DAILY AT 12 NOON. 30 capsule 1   lisinopril-hydrochlorothiazide (ZESTORETIC) 20-25 MG tablet TAKE 1 TABLET BY MOUTH DAILY. 90 tablet 3   Blood Glucose Monitoring Suppl (TRUE METRIX METER) w/Device KIT 1 kit by Does not apply route 3 (three) times daily as needed. 1 kit 0   ondansetron (ZOFRAN-ODT) 4 MG disintegrating tablet Take 1 tablet (4 mg total) by mouth every 8 (eight) hours as needed. 12 tablet 1   No current facility-administered medications on file prior to visit.    ROS Comprehensive ROS Pertinent positive and negative noted in HPI    Objective:  BP 114/77   Pulse 69   Temp 98.3 F (36.8 C) (Oral)   Ht 5\' 8"  (1.727 m)   Wt 208 lb 9.6 oz (94.6 kg)   LMP 06/29/2010   SpO2 96%   BMI 31.72 kg/m   BP Readings from Last 3 Encounters:  12/29/21 114/77  12/05/21 (!) 137/91  06/29/21 118/86    Wt Readings from Last 3 Encounters:  12/29/21 208 lb 9.6 oz (94.6 kg)  12/05/21 222 lb (100.7 kg)  06/29/21 219 lb 6.4 oz (99.5 kg)    Physical Exam Vitals reviewed.  Constitutional:      Appearance: She is obese.  HENT:     Head: Normocephalic.     Right Ear: Tympanic membrane and external ear normal.     Left Ear: Tympanic membrane and external ear normal.  Nose: Nose normal.  Eyes:     Extraocular Movements: Extraocular movements intact.     Pupils: Pupils are equal, round, and reactive to light.  Cardiovascular:     Rate and Rhythm: Normal rate and regular rhythm.  Pulmonary:     Effort: Pulmonary effort is normal.     Breath sounds: Normal breath sounds.  Abdominal:     General: Bowel sounds are normal. There is distension.     Palpations: Abdomen is soft.  Musculoskeletal:        General: Normal range of motion.     Cervical back: Normal range of motion and neck supple.  Skin:    General: Skin is warm and dry.  Neurological:     Mental Status: She is  alert and oriented to person, place, and time.  Psychiatric:        Mood and Affect: Mood normal.        Behavior: Behavior normal.   Lab Results  Component Value Date   HGBA1C 6.6 (A) 06/29/2021   HGBA1C 6.8 (A) 03/28/2021   HGBA1C 7.6 (A) 12/20/2020    Lab Results  Component Value Date   WBC 5.2 12/05/2021   HGB 13.7 12/05/2021   HCT 38.2 12/05/2021   PLT 313 12/05/2021   GLUCOSE 128 (H) 12/05/2021   CHOL 179 12/20/2020   TRIG 90 12/20/2020   HDL 45 12/20/2020   LDLCALC 117 (H) 12/20/2020   ALT 49 (H) 12/05/2021   AST 50 (H) 12/05/2021   NA 134 (L) 12/05/2021   K 3.3 (L) 12/05/2021   CL 99 12/05/2021   CREATININE 0.79 12/05/2021   BUN 17 12/05/2021   CO2 26 12/05/2021   TSH 1.660 07/29/2017   INR 1.01 09/18/2010   HGBA1C 6.6 (A) 06/29/2021     Assessment & Plan:   [name] was seen today for follow-up.  Diagnoses and all orders for this visit: [name] was seen today for follow-up.  Diagnoses and all orders for this visit:  Type 2 diabetes mellitus without complication, without long-term current use of insulin (HCC) -     HgB A1c 7.6 her A1c has increased from previously 6.6.  We will need to monitor carbohydrates increase and being placed on a diabetic medication to lower her A1c.  This will be deferred to her new PCP due to all her GI problems follow-up.  Need for immunization against influenza -     Flu Vaccine QUAD 84mo+IM (Fluarix, Fluzone & Alfiuria Quad PF)  Hypertension, unspecified type Well-controlled on monotherapy calcium channel blocker goal is less than 130/80 she will continue to monitor sodium intake and exercise as tolerated -     amLODipine (NORVASC) 10 MG tablet; TAKE 1 TABLET (10 MG TOTAL) BY MOUTH DAILY.  Medication refill -     amLODipine (NORVASC) 10 MG tablet; TAKE 1 TABLET (10 MG TOTAL) BY MOUTH DAILY.    I have discontinued Elberta Fortis. Muzio's atorvastatin. I am also having her maintain her True Metrix Meter, amLODipine,  lisinopril-hydrochlorothiazide, lansoprazole, and ondansetron.  No orders of the defined types were placed in this encounter.    Follow-up:   No follow-ups on file.  The above assessment and management plan was discussed with the patient. The patient verbalized understanding of and has agreed to the management plan. Patient is aware to call the clinic if symptoms fail to improve or worsen. Patient is aware when to return to the clinic for a follow-up visit. Patient educated on when it is  appropriate to go to the emergency department.   Juluis Mire, NP-C

## 2021-12-29 NOTE — Patient Instructions (Addendum)
Gastroparesis is also called delayed gastric emptying it consist of weak muscular contractions (peristalsis) of the stomach resulting in food and liquid remaining in the stomach for prolonged periods of time.  Stomach contents does exist more slowly into the duodenum digestive tract.  Symptoms can include nausea vomiting abdominal pain feeling full soon after or beginning to eat, abdominal bloating and heartburn.  Influenza, Adult Influenza is also called "the flu." It is an infection in the lungs, nose, and throat (respiratory tract). It spreads easily from person to person (is contagious). The flu causes symptoms that are like a cold, along with high fever and body aches. What are the causes? This condition is caused by the influenza virus. You can get the virus by: Breathing in droplets that are in the air after a person infected with the flu coughed or sneezed. Touching something that has the virus on it and then touching your mouth, nose, or eyes. What increases the risk? Certain things may make you more likely to get the flu. These include: Not washing your hands often. Having close contact with many people during cold and flu season. Touching your mouth, eyes, or nose without first washing your hands. Not getting a flu shot every year. You may have a higher risk for the flu, and serious problems, such as a lung infection (pneumonia), if you: Are older than 65. Are pregnant. Have a weakened disease-fighting system (immune system) because of a disease or because you are taking certain medicines. Have a long-term (chronic) condition, such as: Heart, kidney, or lung disease. Diabetes. Asthma. Have a liver disorder. Are very overweight (morbidly obese). Have anemia. What are the signs or symptoms? Symptoms usually begin suddenly and last 4-14 days. They may include: Fever and chills. Headaches, body aches, or muscle aches. Sore throat. Cough. Runny or stuffy (congested) nose. Feeling  discomfort in your chest. Not wanting to eat as much as normal. Feeling weak or tired. Feeling dizzy. Feeling sick to your stomach or throwing up. How is this treated? If the flu is found early, you can be treated with antiviral medicine. This can help to reduce how bad the illness is and how long it lasts. This may be given by mouth or through an IV tube. Taking care of yourself at home can help your symptoms get better. Your doctor may want you to: Take over-the-counter medicines. Drink plenty of fluids. The flu often goes away on its own. If you have very bad symptoms or other problems, you may be treated in a hospital. Follow these instructions at home:     Activity Rest as needed. Get plenty of sleep. Stay home from work or school as told by your doctor. Do not leave home until you do not have a fever for 24 hours without taking medicine. Leave home only to go to your doctor. Eating and drinking Take an ORS (oral rehydration solution). This is a drink that is sold at pharmacies and stores. Drink enough fluid to keep your pee pale yellow. Drink clear fluids in small amounts as you are able. Clear fluids include: Water. Ice chips. Fruit juice mixed with water. Low-calorie sports drinks. Eat bland foods that are easy to digest. Eat small amounts as you are able. These foods include: Bananas. Applesauce. Rice. Lean meats. Toast. Crackers. Do not eat or drink: Fluids that have a lot of sugar or caffeine. Alcohol. Spicy or fatty foods. General instructions Take over-the-counter and prescription medicines only as told by your doctor. Use a  cool mist humidifier to add moisture to the air in your home. This can make it easier for you to breathe. When using a cool mist humidifier, clean it daily. Empty water and replace with clean water. Cover your mouth and nose when you cough or sneeze. Wash your hands with soap and water often and for at least 20 seconds. This is also  important after you cough or sneeze. If you cannot use soap and water, use alcohol-based hand sanitizer. Keep all follow-up visits. How is this prevented?  Get a flu shot every year. You may get the flu shot in late summer, fall, or winter. Ask your doctor when you should get your flu shot. Avoid contact with people who are sick during fall and winter. This is cold and flu season. Contact a doctor if: You get new symptoms. You have: Chest pain. Watery poop (diarrhea). A fever. Your cough gets worse. You start to have more mucus. You feel sick to your stomach. You throw up. Get help right away if you: Have shortness of breath. Have trouble breathing. Have skin or nails that turn a bluish color. Have very bad pain or stiffness in your neck. Get a sudden headache. Get sudden pain in your face or ear. Cannot eat or drink without throwing up. These symptoms may represent a serious problem that is an emergency. Get medical help right away. Call your local emergency services (911 in the [place] Do not wait to see if the symptoms will go away. Do not drive yourself to the hospital. Summary Influenza is also called "the flu." It is an infection in the lungs, nose, and throat. It spreads easily from person to person. Take over-the-counter and prescription medicines only as told by your doctor. Getting a flu shot every year is the best way to not get the flu. This information is not intended to replace advice given to you by your health care provider. Make sure you discuss any questions you have with your health care provider. Document Revised: 12/04/2019 Document Reviewed: 12/04/2019 Elsevier Patient Education  [employer].

## 2022-01-01 ENCOUNTER — Telehealth (INDEPENDENT_AMBULATORY_CARE_PROVIDER_SITE_OTHER): Payer: Self-pay | Admitting: Primary Care

## 2022-01-02 ENCOUNTER — Other Ambulatory Visit: Payer: Self-pay

## 2022-01-23 ENCOUNTER — Other Ambulatory Visit: Payer: Self-pay

## 2022-01-23 ENCOUNTER — Ambulatory Visit (INDEPENDENT_AMBULATORY_CARE_PROVIDER_SITE_OTHER): Payer: Commercial Managed Care - HMO | Admitting: Internal Medicine

## 2022-01-23 ENCOUNTER — Encounter: Payer: Self-pay | Admitting: Internal Medicine

## 2022-01-23 VITALS — BP 118/76 | HR 76 | Temp 98.4°F | Ht 68.0 in | Wt 201.0 lb

## 2022-01-23 DIAGNOSIS — E785 Hyperlipidemia, unspecified: Secondary | ICD-10-CM

## 2022-01-23 DIAGNOSIS — T502X5A Adverse effect of carbonic-anhydrase inhibitors, benzothiadiazides and other diuretics, initial encounter: Secondary | ICD-10-CM

## 2022-01-23 DIAGNOSIS — R1319 Other dysphagia: Secondary | ICD-10-CM | POA: Insufficient documentation

## 2022-01-23 DIAGNOSIS — E876 Hypokalemia: Secondary | ICD-10-CM | POA: Diagnosis not present

## 2022-01-23 DIAGNOSIS — I10 Essential (primary) hypertension: Secondary | ICD-10-CM

## 2022-01-23 DIAGNOSIS — E118 Type 2 diabetes mellitus with unspecified complications: Secondary | ICD-10-CM | POA: Diagnosis not present

## 2022-01-23 DIAGNOSIS — Z23 Encounter for immunization: Secondary | ICD-10-CM

## 2022-01-23 DIAGNOSIS — K219 Gastro-esophageal reflux disease without esophagitis: Secondary | ICD-10-CM | POA: Diagnosis not present

## 2022-01-23 MED ORDER — LANSOPRAZOLE 30 MG PO CPDR
30.0000 mg | DELAYED_RELEASE_CAPSULE | Freq: Every day | ORAL | 0 refills | Status: DC
  Filled 2022-01-23: qty 90, 90d supply, fill #0
  Filled 2022-01-29: qty 30, 30d supply, fill #0
  Filled 2022-02-23: qty 30, 30d supply, fill #1
  Filled 2022-03-28: qty 30, 30d supply, fill #2

## 2022-01-23 NOTE — Patient Instructions (Signed)
Gastroesophageal Reflux Disease, Adult Gastroesophageal reflux (GER) happens when acid from the stomach flows up into the tube that connects the mouth and the stomach (esophagus). Normally, food travels down the esophagus and stays in the stomach to be digested. However, when a person has GER, food and stomach acid sometimes move back up into the esophagus. If this becomes a more serious problem, the person may be diagnosed with a disease called gastroesophageal reflux disease (GERD). GERD occurs when the reflux: Happens often. Causes frequent or severe symptoms. Causes problems such as damage to the esophagus. When stomach acid comes in contact with the esophagus, the acid may cause inflammation in the esophagus. Over time, GERD may create small holes (ulcers) in the lining of the esophagus. What are the causes? This condition is caused by a problem with the muscle between the esophagus and the stomach (lower esophageal sphincter, or LES). Normally, the LES muscle closes after food passes through the esophagus to the stomach. When the LES is weakened or abnormal, it does not close properly, and that allows food and stomach acid to go back up into the esophagus. The LES can be weakened by certain dietary substances, medicines, and medical conditions, including: Tobacco use. Pregnancy. Having a hiatal hernia. Alcohol use. Certain foods and beverages, such as coffee, chocolate, onions, and peppermint. What increases the risk? You are more likely to develop this condition if you: Have an increased body weight. Have a connective tissue disorder. Take NSAIDs, such as ibuprofen. What are the signs or symptoms? Symptoms of this condition include: Heartburn. Difficult or painful swallowing and the feeling of having a lump in the throat. A bitter taste in the mouth. Bad breath and having a large amount of saliva. Having an upset or bloated stomach and belching. Chest pain. Different conditions can  cause chest pain. Make sure you see your health care provider if you experience chest pain. Shortness of breath or wheezing. Ongoing (chronic) cough or a nighttime cough. Wearing away of tooth enamel. Weight loss. How is this diagnosed? This condition may be diagnosed based on a medical history and a physical exam. To determine if you have mild or severe GERD, your health care provider may also monitor how you respond to treatment. You may also have tests, including: A test to examine your stomach and esophagus with a small camera (endoscopy). A test that measures the acidity level in your esophagus. A test that measures how much pressure is on your esophagus. A barium swallow or modified barium swallow test to show the shape, size, and functioning of your esophagus. How is this treated? Treatment for this condition may vary depending on how severe your symptoms are. Your health care provider may recommend: Changes to your diet. Medicine. Surgery. The goal of treatment is to help relieve your symptoms and to prevent complications. Follow these instructions at home: Eating and drinking  Follow a diet as recommended by your health care provider. This may involve avoiding foods and drinks such as: Coffee and tea, with or without caffeine. Drinks that contain alcohol. Energy drinks and sports drinks. Carbonated drinks or sodas. Chocolate and cocoa. Peppermint and mint flavorings. Garlic and onions. Horseradish. Spicy and acidic foods, including peppers, chili powder, curry powder, vinegar, hot sauces, and barbecue sauce. Citrus fruit juices and citrus fruits, such as oranges, lemons, and limes. Tomato-based foods, such as red sauce, chili, salsa, and pizza with red sauce. Fried and fatty foods, such as donuts, french fries, potato chips, and high-fat dressings.   High-fat meats, such as hot dogs and fatty cuts of red and white meats, such as rib eye steak, sausage, ham, and  bacon. High-fat dairy items, such as whole milk, butter, and cream cheese. Eat small, frequent meals instead of large meals. Avoid drinking large amounts of liquid with your meals. Avoid eating meals during the 2-3 hours before bedtime. Avoid lying down right after you eat. Do not exercise right after you eat. Lifestyle  Do not use any products that contain nicotine or tobacco. These products include cigarettes, chewing tobacco, and vaping devices, such as e-cigarettes. If you need help quitting, ask your health care provider. Try to reduce your stress by using methods such as yoga or meditation. If you need help reducing stress, ask your health care provider. If you are overweight, reduce your weight to an amount that is healthy for you. Ask your health care provider for guidance about a safe weight loss goal. General instructions Pay attention to any changes in your symptoms. Take over-the-counter and prescription medicines only as told by your health care provider. Do not take aspirin, ibuprofen, or other NSAIDs unless your health care provider told you to take these medicines. Wear loose-fitting clothing. Do not wear anything tight around your waist that causes pressure on your abdomen. Raise (elevate) the head of your bed about 6 inches (15 cm). You can use a wedge to do this. Avoid bending over if this makes your symptoms worse. Keep all follow-up visits. This is important. Contact a health care provider if: You have: New symptoms. Unexplained weight loss. Difficulty swallowing or it hurts to swallow. Wheezing or a persistent cough. A hoarse voice. Your symptoms do not improve with treatment. Get help right away if: You have sudden pain in your arms, neck, jaw, teeth, or back. You suddenly feel sweaty, dizzy, or light-headed. You have chest pain or shortness of breath. You vomit and the vomit is green, yellow, or black, or it looks like blood or coffee grounds. You faint. You  have stool that is red, bloody, or black. You cannot swallow, drink, or eat. These symptoms may represent a serious problem that is an emergency. Do not wait to see if the symptoms will go away. Get medical help right away. Call your local emergency services (911 in the [place] Do not drive yourself to the hospital. Summary Gastroesophageal reflux happens when acid from the stomach flows up into the esophagus. GERD is a disease in which the reflux happens often, causes frequent or severe symptoms, or causes problems such as damage to the esophagus. Treatment for this condition may vary depending on how severe your symptoms are. Your health care provider may recommend diet and lifestyle changes, medicine, or surgery. Contact a health care provider if you have new or worsening symptoms. Take over-the-counter and prescription medicines only as told by your health care provider. Do not take aspirin, ibuprofen, or other NSAIDs unless your health care provider told you to do so. Keep all follow-up visits as told by your health care provider. This is important. This information is not intended to replace advice given to you by your health care provider. Make sure you discuss any questions you have with your health care provider. Document Revised: 10/26/2019 Document Reviewed: 10/26/2019 Elsevier Patient Education  2023 [employer]  

## 2022-01-23 NOTE — Progress Notes (Signed)
Subjective:  Patient ID: Sheryl [name], female    DOB: 17-Aug-1965  Age: 56 y.o. MRN: [id number]  CC: Gastroesophageal Reflux, Hypertension, Diabetes, and Hyperlipidemia   HPI Sheryl [name] presents for establishing.  She complains of a 29-monthhistory of dysphagia, early satiety, intermittent vomiting, and a 12 pound weight loss.  She was recently seen in the ED and prescribed an antiemetic but it did not help her symptoms.  History EBle[place]as a past medical history of Blood transfusion without reported diagnosis, Diabetes ([place] (03/13/2021), Fibroid, H/O blood clots, History of hysterectomy, and Hypertension.   She has a past surgical history that includes Abdominal hysterectomy.   Her family history includes Breast cancer in her paternal grandmother; Cancer in an other family member; Diabetes in her mother and another family member; Heart attack in her maternal grandmother and another family member; Hypertension in her mother and another family member; Stroke in her father, mother, and another family member.She reports that she has never smoked. She has never used smokeless tobacco. She reports that she does not drink alcohol and does not use drugs.  Outpatient Medications Prior to Visit  Medication Sig Dispense Refill   Acetaminophen (TYLENOL EXTRA STRENGTH PO) Take 500 mg by mouth as needed.     amLODipine (NORVASC) 10 MG tablet TAKE 1 TABLET (10 MG TOTAL) BY MOUTH DAILY. 90 tablet 1   Bacillus Coagulans-Inulin (PROBIOTIC-PREBIOTIC) 1-250 BILLION-MG CAPS Take by mouth.     Biotin 1000 MCG CHEW Chew by mouth.     Blood Glucose Monitoring Suppl (TRUE METRIX METER) w/Device KIT 1 kit by Does not apply route 3 (three) times daily as needed. 1 kit 0   Cholecalciferol (VITAMIN D3) 125 MCG (5000 UT) TABS Take by mouth.     Misc Natural Products (ELDERBERRY/VITAMIN C/ZINC) CHEW Chew 50 mg by mouth.     diphenhydrAMINE (BENADRYL ALLERGY) 25 mg capsule Take 25 mg by mouth every 6 (six)  hours as needed.     lansoprazole (PREVACID) 30 MG capsule TAKE 1 CAPSULE (30 MG TOTAL) BY MOUTH DAILY AT 12 NOON. 30 capsule 1   lisinopril-hydrochlorothiazide (ZESTORETIC) 20-25 MG tablet TAKE 1 TABLET BY MOUTH DAILY. 90 tablet 3   ondansetron (ZOFRAN-ODT) 4 MG disintegrating tablet Take 1 tablet (4 mg total) by mouth every 8 (eight) hours as needed. 12 tablet 1   No facility-administered medications prior to visit.    ROS Review of Systems  Constitutional:  Positive for unexpected weight change. Negative for chills, diaphoresis, fatigue and fever.  HENT:  Positive for trouble swallowing. Negative for voice change.   Eyes: Negative.   Respiratory:  Negative for cough, chest tightness, shortness of breath and wheezing.   Cardiovascular:  Negative for chest pain, palpitations and leg swelling.  Gastrointestinal:  Positive for abdominal pain and vomiting. Negative for constipation, diarrhea and nausea.  Endocrine: Negative.   Genitourinary: Negative.  Negative for difficulty urinating.  Musculoskeletal: Negative.   Skin: Negative.   Neurological: Negative.  Negative for dizziness and weakness.  Hematological:  Negative for adenopathy. Does not bruise/bleed easily.    Objective:  BP 118/76 (BP Location: Left Arm, Patient Position: Sitting, Cuff Size: Normal)   Pulse 76   Temp 98.4 F (36.9 C) (Oral)   Ht 5' 8" (1.727 m)   Wt 201 lb (91.2 kg)   LMP 06/29/2010   SpO2 99%   BMI 30.56 kg/m   Physical Exam Vitals reviewed.  HENT:     Nose: Nose normal.  Mouth/Throat:     Mouth: Mucous membranes are moist.  Eyes:     General: No scleral icterus.    Conjunctiva/sclera: Conjunctivae normal.  Cardiovascular:     Rate and Rhythm: Normal rate and regular rhythm.     Heart sounds: No murmur heard. Pulmonary:     Effort: Pulmonary effort is normal.     Breath sounds: No stridor. No wheezing, rhonchi or rales.  Abdominal:     General: Abdomen is flat.     Palpations: There is  no mass.     Tenderness: There is no abdominal tenderness. There is no guarding.     Hernia: No hernia is present.  Musculoskeletal:        General: Normal range of motion.     Cervical back: Neck supple.     Right lower leg: No edema.     Left lower leg: No edema.  Lymphadenopathy:     Cervical: No cervical adenopathy.  Skin:    General: Skin is warm and dry.  Neurological:     General: No focal deficit present.     Mental Status: She is alert.  Psychiatric:        Mood and Affect: Mood normal.        Behavior: Behavior normal.     Lab Results  Component Value Date   WBC 5.2 12/05/2021   HGB 13.7 12/05/2021   HCT 38.2 12/05/2021   PLT 313 12/05/2021   GLUCOSE 98 01/23/2022   CHOL 179 12/20/2020   TRIG 90 12/20/2020   HDL 45 12/20/2020   LDLCALC 117 (H) 12/20/2020   ALT 29 01/23/2022   AST 29 01/23/2022   NA 135 01/23/2022   K 3.8 01/23/2022   CL 99 01/23/2022   CREATININE 0.88 01/23/2022   BUN 20 01/23/2022   CO2 28 01/23/2022   TSH 0.42 01/23/2022   INR 1.01 09/18/2010   HGBA1C 6.8 (A) 12/29/2021   MICROALBUR <0.7 01/23/2022     CT Abdomen Pelvis W Contrast  Result Date: 12/05/2021 CLINICAL DATA:  Acute abdominal pain EXAM: CT ABDOMEN AND PELVIS WITH CONTRAST TECHNIQUE: Multidetector CT imaging of the abdomen and pelvis was performed using the standard protocol following bolus administration of intravenous contrast. RADIATION DOSE REDUCTION: This exam was performed according to the departmental dose-optimization program which includes automated exposure control, adjustment of the mA and/or kV according to patient size and/or use of iterative reconstruction technique. CONTRAST:  182m OMNIPAQUE IOHEXOL 300 MG/ML  SOLN COMPARISON:  01/18/2011 FINDINGS: Lower chest: No acute pleural or parenchymal lung disease. Hepatobiliary: No focal liver abnormality is seen. No gallstones, gallbladder wall thickening, or biliary dilatation. Pancreas: Unremarkable. No pancreatic ductal  dilatation or surrounding inflammatory changes. Spleen: Normal in size without focal abnormality. Adrenals/Urinary Tract: Multiple simple left renal cysts do not require specific imaging follow-up. Otherwise the kidneys enhance normally. No urinary tract calculi or obstructive uropathy. The adrenals and bladder are unremarkable. Stomach/Bowel: No bowel obstruction or ileus. Normal appendix right lower quadrant. No bowel wall thickening or inflammatory change. Vascular/Lymphatic: No significant vascular findings are present. No enlarged abdominal or pelvic lymph nodes. 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 This has been stable since 2007. Reproductive: Status post hysterectomy. No adnexal masses. Other: No free fluid or free intraperitoneal gas. Small fat containing supraumbilical midline ventral hernia. No bowel herniation. Musculoskeletal: No acute or destructive bony lesions. Reconstructed images demonstrate no additional findings. IMPRESSION: 1.  No acute intra-abdominal or intrapelvic process. 2. Small fat containing supraumbilical midline ventral hernia. 3. 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 Electronically Signed   By: Randa Ngo M.D.   On: 12/05/2021 21:21     Assessment & Plan:   Sheryl [name] was seen today for gastroesophageal reflux, hypertension, diabetes and hyperlipidemia.  Diagnoses and all orders for this visit:  Esophageal dysphagia -     Ambulatory referral to Gastroenterology  Gastroesophageal reflux disease without esophagitis- I have asked her to consistently take the PPI. -     lansoprazole (PREVACID) 30 MG capsule; Take 1 capsule (30 mg total) by mouth daily at 12 noon.  Hyperlipidemia LDL goal <100- Will start a statin for cardiovascular risk reduction. -     Hepatic function panel; Future -     TSH;  Future -     TSH -     Hepatic function panel -     rosuvastatin (CRESTOR) 5 MG tablet; Take 1 tablet (5 mg total) by mouth daily.  Diuretic-induced hypokalemia- Her potassium level is normal now. -     Basic metabolic panel; Future -     Magnesium; Future -     Magnesium -     Basic metabolic panel  Primary hypertension- Her blood pressure is overcontrolled and she has a history of hypokalemia.  Will discontinue the ACE inhibitor and thiazide diuretic. -     Basic metabolic panel; Future -     Magnesium; Future -     Urinalysis, Routine w reflex microscopic; Future -     TSH; Future -     TSH -     Urinalysis, Routine w reflex microscopic -     Magnesium -     Basic metabolic panel  Type II diabetes mellitus with manifestations ([place])- Her blood sugar is adequately well controlled. -     Microalbumin / creatinine urine ratio; Future -     Hepatic function panel; Future -     Hepatic function panel -     Microalbumin / creatinine urine ratio  Need for vaccination -     Pneumococcal conjugate vaccine 20-valent (Prevnar 20)   I have discontinued Sheryl [name]'s lisinopril-hydrochlorothiazide, ondansetron, and diphenhydrAMINE. I am also having her start on rosuvastatin. Additionally, I am having her maintain her True Metrix Meter, amLODipine, Acetaminophen (TYLENOL EXTRA STRENGTH PO), Probiotic-Prebiotic, Vitamin D3, Biotin, Elderberry/Vitamin C/Zinc, and lansoprazole.  Meds ordered this encounter  Medications   lansoprazole (PREVACID) 30 MG capsule    Sig: Take 1 capsule (30 mg total) by mouth daily at 12 noon.    Dispense:  90 capsule    Refill:  0   rosuvastatin (CRESTOR) 5 MG tablet    Sig: Take 1 tablet (5 mg total) by mouth daily.    Dispense:  90 tablet    Refill:  1     Follow-up: Return in about 3 months (around 04/24/2022).  Scarlette Calico, MD

## 2022-01-24 ENCOUNTER — Other Ambulatory Visit: Payer: Self-pay

## 2022-01-24 DIAGNOSIS — I10 Essential (primary) hypertension: Secondary | ICD-10-CM | POA: Insufficient documentation

## 2022-01-24 DIAGNOSIS — E785 Hyperlipidemia, unspecified: Secondary | ICD-10-CM | POA: Insufficient documentation

## 2022-01-24 DIAGNOSIS — Z23 Encounter for immunization: Secondary | ICD-10-CM | POA: Insufficient documentation

## 2022-01-24 DIAGNOSIS — E118 Type 2 diabetes mellitus with unspecified complications: Secondary | ICD-10-CM | POA: Insufficient documentation

## 2022-01-24 LAB — URINALYSIS, ROUTINE W REFLEX MICROSCOPIC
Bilirubin Urine: NEGATIVE
Hgb urine dipstick: NEGATIVE
Ketones, ur: NEGATIVE
Leukocytes,Ua: NEGATIVE
Nitrite: NEGATIVE
Specific Gravity, Urine: 1.02 (ref 1.000–1.030)
Total Protein, Urine: NEGATIVE
Urine Glucose: NEGATIVE
Urobilinogen, UA: 0.2 (ref 0.0–1.0)
pH: 5.5 (ref 5.0–8.0)

## 2022-01-24 LAB — MICROALBUMIN / CREATININE URINE RATIO
Creatinine,U: 131 mg/dL
Microalb Creat Ratio: 0.5 mg/g (ref 0.0–30.0)
Microalb, Ur: <0.7 mg/dL (ref 0.0–1.9)

## 2022-01-24 LAB — BASIC METABOLIC PANEL
BUN: 20 mg/dL (ref 6–23)
CO2: 28 mEq/L (ref 19–32)
Calcium: 9.9 mg/dL (ref 8.4–10.5)
Chloride: 99 mEq/L (ref 96–112)
Creatinine, Ser: 0.88 mg/dL (ref 0.40–1.20)
GFR: 72.42 mL/min (ref >60.00)
Glucose, Bld: 98 mg/dL (ref 70–99)
Potassium: 3.8 mEq/L (ref 3.5–5.1)
Sodium: 135 mEq/L (ref 135–145)

## 2022-01-24 LAB — TSH: TSH: 0.42 u[IU]/mL (ref 0.35–5.50)

## 2022-01-24 LAB — HEPATIC FUNCTION PANEL
ALT: 29 U/L (ref 0–35)
AST: 29 U/L (ref 0–37)
Albumin: 4.6 g/dL (ref 3.5–5.2)
Alkaline Phosphatase: 73 U/L (ref 39–117)
Bilirubin, Direct: 0.2 mg/dL (ref 0.0–0.3)
Total Bilirubin: 0.6 mg/dL (ref 0.2–1.2)
Total Protein: 7.7 g/dL (ref 6.0–8.3)

## 2022-01-24 LAB — MAGNESIUM: Magnesium: 2.1 mg/dL (ref 1.5–2.5)

## 2022-01-24 MED ORDER — ROSUVASTATIN CALCIUM 5 MG PO TABS
5.0000 mg | ORAL_TABLET | Freq: Every day | ORAL | 1 refills | Status: DC
  Filled 2022-01-24: qty 30, 30d supply, fill #0
  Filled 2022-04-27 – 2022-08-27 (×4): qty 30, 30d supply, fill #1

## 2022-01-25 ENCOUNTER — Other Ambulatory Visit: Payer: Self-pay

## 2022-01-25 ENCOUNTER — Encounter: Payer: Self-pay | Admitting: Nurse Practitioner

## 2022-01-29 ENCOUNTER — Other Ambulatory Visit: Payer: Self-pay

## 2022-02-12 ENCOUNTER — Ambulatory Visit: Payer: Commercial Managed Care - HMO | Admitting: Obstetrics & Gynecology

## 2022-02-16 ENCOUNTER — Ambulatory Visit: Payer: Self-pay | Admitting: Obstetrics & Gynecology

## 2022-02-16 ENCOUNTER — Ambulatory Visit: Payer: Commercial Managed Care - HMO | Admitting: Obstetrics & Gynecology

## 2022-02-23 ENCOUNTER — Other Ambulatory Visit: Payer: Self-pay

## 2022-02-25 NOTE — Progress Notes (Unsigned)
02/25/2022 Sheryl [name] [id number] [birth year]   CHIEF COMPLAINT: Vomiting  HISTORY OF PRESENT ILLNESS: Sheryl [name] is a 56 year old female with a past medical history of hypertension, DM II and GERD. Past hysterectomy. She presents to our office today as referred by Dr. Scarlette Calico for further evaluation regarding early satiety, vomiting and weight loss.  She was previously followed by Dr. Oletta Lamas with [facility].  She wishes to transition her GI management to [facility] gastroenterology. She is on Lansoprazole 21m once daily for about 4 years. No heartburn. No dysphagia. She describes having vomiting episodes without nausea 4 to 5 times weekly for the past 6 to 7 months.  Emesis was described as bilious or partially digested food.  No coffee-ground or frank hematemesis.  She has intermittent epigastric pain for the past 2 months.  No NSAID use.  Rare alcohol use.  She reported undergoing an EGD and colonoscopy in 2011 by Dr. EOletta Lamas  She reported the EGD was normal and a few polyps were removed from the colon.  She stated she was instructed to repeat a colonoscopy in 7 years which was not done. Father with history of colon polyps.  No known family history of colorectal cancer.  [facility] due to having epigastric pain and vomiting.  Earlier that day, she was at a family gathering and ate pulled chicken and abruptly vomited up the chicken.  Labs in the ED showed a WBC of 5.2.  Hemoglobin 13.7.  AST 50.  ALT 49.  CTAP without any acute intra-abdominal or intrapelvic process but showed a small fat-containing supraumbilical midline ventral hernia and a stable soft tissue nodule within the left upper quadrant area.  She was discharged home on ondansetron with recommendations to go further GI evaluation possible EGD.  Patient reported having a left upper quadrant mass since at least 2006 which was biopsied, results were reported as benign without evidence of  lymphoma.  She is passing a normal formed brown bowel movement daily.  No rectal bleeding or black stools.      Latest Ref Rng & Units 12/05/2021    6:37 PM 12/20/2020    9:57 AM 06/12/2020    6:52 AM  CBC  WBC 4.0 - 10.5 K/uL 5.2  4.1  5.5   Hemoglobin 12.0 - 15.0 g/dL 13.7  14.4  14.3   Hematocrit 36.0 - 46.0 % 38.2  40.7  40.8   Platelets 150 - 400 K/uL 313  313  277        Latest Ref Rng & Units 01/23/2022    4:33 PM 12/05/2021    6:37 PM 12/20/2020    9:57 AM  CMP  Glucose 70 - 99 mg/dL 98  128  192   BUN 6 - 23 mg/dL _0 Creatinine 0.40 - 1.20 mg/dL 0.88  0.79  0.80   Sodium 135 - 145 mEq/L 135  134  137   Potassium 3.5 - 5.1 mEq/L 3.8  3.3  3.9   Chloride 96 - 112 mEq/L 99  99  97   CO2 19 - 32 mEq/L _1 Calcium 8.4 - 10.5 mg/dL 9.9  9.2  10.0   Total Protein 6.0 - 8.3 g/dL 7.7  7.5  7.3   Total Bilirubin 0.2 - 1.2 mg/dL 0.6  1.0  0.5   Alkaline Phos 39 - 117 U/L 73  80  108   AST 0 - 37 U/L 29  50  81   ALT 0 - 35 U/L 29  49  81      CTAP 12/05/2021: COMPARISON:  01/18/2011   FINDINGS: Lower chest: No acute pleural or parenchymal lung disease.   Hepatobiliary: No focal liver abnormality is seen. No gallstones, gallbladder wall thickening, or biliary dilatation.   Pancreas: Unremarkable. No pancreatic ductal dilatation or surrounding inflammatory changes.   Spleen: Normal in size without focal abnormality.   Adrenals/Urinary Tract: Multiple simple left renal cysts do not require specific imaging follow-up. Otherwise the kidneys enhance normally. No urinary tract calculi or obstructive uropathy. The adrenals and bladder are unremarkable.   Stomach/Bowel: No bowel obstruction or ileus. Normal appendix right lower quadrant. No bowel wall thickening or inflammatory change.   Vascular/Lymphatic: No significant vascular findings are present. No enlarged abdominal or pelvic lymph nodes. 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 This has been stable since 2007.   Reproductive: Status post hysterectomy. No adnexal masses.   Other: No free fluid or free intraperitoneal gas. Small fat containing supraumbilical midline ventral hernia. No bowel herniation.   Musculoskeletal: No acute or destructive bony lesions. Reconstructed images demonstrate no additional findings.   IMPRESSION: 1. No acute intra-abdominal or intrapelvic process. 2. Small fat containing supraumbilical midline ventral hernia. 3. 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   Past Medical History:  Diagnosis Date   Blood transfusion without reported diagnosis    had transfusion with hysterectomy   Diabetes ([place]) 03/13/2021   Fibroid    H/O blood clots    History of hysterectomy    fibroids and heavy cycles   Hypertension    Past Surgical History:  Procedure Laterality Date   ABDOMINAL HYSTERECTOMY    Surgery to remove abdominal fatty tissue to the buttocks 2014.  Social History: She is widowed. She has one son and one daughter. Nonsmoker. Rare alcohol intake. No drug use.   Family History: No family history of esophageal, gastric or colon cancer. Mother had malignant melanoma and numerous CVAs. Father had afib and CVA.  Paternal aunt had breast cancer. Maternal uncle x 2 had stomach cancer. Maternal aunt died from pancreatic cancer. Paternal grandmother had breast cancer.    Allergies  Allergen Reactions   Aspirin Anaphylaxis   Cyclobenzaprine Anaphylaxis   Naproxen Sodium Anaphylaxis   Zithromax [Azithromycin Dihydrate] Anaphylaxis   Dilaudid [Hydromorphone] Nausea And Vomiting      Outpatient Encounter Medications as of 02/26/2022  Medication Sig   Acetaminophen (TYLENOL EXTRA STRENGTH PO) Take 500 mg by mouth as needed.   amLODipine (NORVASC) 10 MG tablet TAKE 1 TABLET (10 MG  TOTAL) BY MOUTH DAILY.   Bacillus Coagulans-Inulin (PROBIOTIC-PREBIOTIC) 1-250 BILLION-MG CAPS Take by mouth.   Biotin 1000 MCG CHEW Chew by mouth.   Blood Glucose Monitoring Suppl (TRUE METRIX METER) w/Device KIT 1 kit by Does not apply route 3 (three) times daily as needed.   Cholecalciferol (VITAMIN D3) 125 MCG (5000 UT) TABS Take by mouth.   lansoprazole (PREVACID) 30 MG capsule Take 1 capsule (30 mg total) by mouth daily at 12 noon.   Misc Natural Products (ELDERBERRY/VITAMIN C/ZINC) CHEW Chew 50 mg by mouth.   rosuvastatin (CRESTOR) 5 MG tablet Take 1 tablet (5 mg total) by mouth daily.   No facility-administered  encounter medications on file as of 02/26/2022.   REVIEW OF SYSTEMS:  Gen: Denies fever, sweats or chills. No weight loss.  CV: Denies chest pain, palpitations or edema. Resp: Denies cough, shortness of breath of hemoptysis.  GI:See HPI.  GU : Denies urinary burning, blood in urine, increased urinary frequency or incontinence. MS: Denies joint pain, muscles aches or weakness. Derm: Denies rash, itchiness, skin lesions or unhealing ulcers. Psych: Denies depression, anxiety or memory loss. Heme: Denies bruising, easy bleeding. Neuro:  Denies headaches, dizziness or paresthesias. Endo:  Denies any problems with DM, thyroid or adrenal function.  PHYSICAL EXAM: LMP 06/29/2010  BP 110/78   Pulse 88   Ht _0  (1.727 m)   Wt 192 lb (87.1 kg)   LMP 06/29/2010   SpO2 98%   BMI 29.19 kg/m   General: 56 year old female in no acute distress. Head: Normocephalic and atraumatic. Eyes:  Sclerae non-icteric, conjunctive pink. Ears: Normal auditory acuity. Mouth: Dentition intact. No ulcers or lesions.  Neck: Supple, no lymphadenopathy or thyromegaly.  Lungs: Clear bilaterally to auscultation without wheezes, crackles or rhonchi. Heart: Regular rate and rhythm. No murmur, rub or gallop appreciated.  Abdomen: Soft, nontender, non distended. No masses. No hepatosplenomegaly.  Normoactive bowel sounds x 4 quadrants.  Rectal: Deferred. Musculoskeletal: Symmetrical with no gross deformities. Skin: Warm and dry. No rash or lesions on visible extremities. Extremities: No edema. Neurological: Alert oriented x 4, no focal deficits.  Psychological:  Alert and cooperative. Normal mood and affect.  ASSESSMENT AND PLAN:  43) 56 year old female with a history of GERD with epigastric pain for  few months and vomiting x 6 to 7 months without nausea. -EGD benefits and risks discussed including risk with sedation, risk of bleeding, perforation and infection  -RUQ sonogram to evaluate the gallbladder -Patient declines need for antiemetic as needed if she does not experience nausea prior to vomiting -Continue Lansoprazole 30 mg once daily -Recommended 3-4 small snacks sized meals daily, avoid eating late at night -Consider gastric emptying study if EGD and RUQ sonogram unrevealing  2) History of colon polyps.  Patient reported last colonoscopy by Dr. Oletta Lamas in 2011 identified a few polyps removed from the colon.  Father with history of colon polyps. -Request [facility] colonoscopy procedure and biopsy results -Colonoscopy benefits and risks discussed including risk with sedation, risk of bleeding, perforation and infection  -Patient to take ondansetron 4 mg ODT dissolve 1 tab under tongue 30 minutes prior to each dose of bowel prep  Patient to contact office if symptoms worsen prior to procedure date          CC:  Janith Lima, MD

## 2022-02-26 ENCOUNTER — Other Ambulatory Visit: Payer: Self-pay

## 2022-02-26 ENCOUNTER — Encounter: Payer: Self-pay | Admitting: Nurse Practitioner

## 2022-02-26 ENCOUNTER — Ambulatory Visit: Payer: Commercial Managed Care - HMO | Admitting: Nurse Practitioner

## 2022-02-26 VITALS — BP 110/78 | HR 88 | Ht 68.0 in | Wt 192.0 lb

## 2022-02-26 DIAGNOSIS — Z86010 Personal history of colonic polyps: Secondary | ICD-10-CM

## 2022-02-26 DIAGNOSIS — R1110 Vomiting, unspecified: Secondary | ICD-10-CM | POA: Diagnosis not present

## 2022-02-26 DIAGNOSIS — K219 Gastro-esophageal reflux disease without esophagitis: Secondary | ICD-10-CM | POA: Diagnosis not present

## 2022-02-26 MED ORDER — ONDANSETRON 4 MG PO TBDP
4.0000 mg | ORAL_TABLET | Freq: Four times a day (QID) | ORAL | 0 refills | Status: DC
  Filled 2022-02-26 – 2022-09-27 (×3): qty 2, 1d supply, fill #0

## 2022-02-26 MED ORDER — NA SULFATE-K SULFATE-MG SULF 17.5-3.13-1.6 GM/177ML PO SOLN
1.0000 | Freq: Once | ORAL | 0 refills | Status: AC
  Filled 2022-02-26: qty 354, 2d supply, fill #0

## 2022-02-26 NOTE — Patient Instructions (Addendum)
You have been scheduled for an endoscopy and colonoscopy. Please follow the written instructions given to you at your visit today. Please pick up your prep supplies at the pharmacy within the next 1-3 days. If you use inhalers (even only as needed), please bring them with you on the day of your procedure.   Eat 3-4 small snack sized meals daily.   Due to recent changes in healthcare laws, you may see the results of your imaging and laboratory studies on MyChart before your provider has had a chance to review them.  We understand that in some cases there may be results that are confusing or concerning to you. Not all laboratory results come back in the same time frame and the provider may be waiting for multiple results in order to interpret others.  Please give [place] 48 hours in order for your provider to thoroughly review all the results before contacting the office for clarification of your results.    It was a pleasure to see you today!  Thank you for trusting me with your gastrointestinal care!

## 2022-02-27 NOTE — Progress Notes (Signed)
Reviewed and agree with management plans. ? ?Zanyia Silbaugh L. Anikin Prosser, MD, MPH  ?

## 2022-02-28 ENCOUNTER — Other Ambulatory Visit: Payer: Self-pay | Admitting: Internal Medicine

## 2022-02-28 DIAGNOSIS — Z1231 Encounter for screening mammogram for malignant neoplasm of breast: Secondary | ICD-10-CM

## 2022-03-02 ENCOUNTER — Other Ambulatory Visit: Payer: Self-pay

## 2022-03-12 ENCOUNTER — Ambulatory Visit (HOSPITAL_COMMUNITY)
Admission: RE | Admit: 2022-03-12 | Discharge: 2022-03-12 | Disposition: A | Discharge status: Home / Self Care | Payer: Commercial Managed Care - HMO | Source: Ambulatory Visit | Attending: Nurse Practitioner | Admitting: Nurse Practitioner

## 2022-03-12 DIAGNOSIS — K219 Gastro-esophageal reflux disease without esophagitis: Secondary | ICD-10-CM | POA: Diagnosis present

## 2022-03-12 DIAGNOSIS — Z86010 Personal history of colonic polyps: Secondary | ICD-10-CM | POA: Diagnosis present

## 2022-03-12 DIAGNOSIS — R1110 Vomiting, unspecified: Secondary | ICD-10-CM | POA: Diagnosis present

## 2022-03-29 ENCOUNTER — Telehealth: Payer: Self-pay

## 2022-03-29 ENCOUNTER — Other Ambulatory Visit: Payer: Self-pay

## 2022-03-29 NOTE — Telephone Encounter (Signed)
Called & moved patient to a sooner time tomorrow d/t recent cancellation. She's aware to arrive by 10:00 am now & has been sent updated copy of instructions.

## 2022-03-30 ENCOUNTER — Ambulatory Visit (AMBULATORY_SURGERY_CENTER): Payer: Commercial Managed Care - HMO | Admitting: Gastroenterology

## 2022-03-30 ENCOUNTER — Other Ambulatory Visit: Payer: Self-pay

## 2022-03-30 ENCOUNTER — Encounter: Payer: Self-pay | Admitting: Gastroenterology

## 2022-03-30 VITALS — BP 137/85 | HR 67 | Temp 97.2°F | Resp 17 | Ht 68.0 in | Wt 192.0 lb

## 2022-03-30 DIAGNOSIS — C801 Malignant (primary) neoplasm, unspecified: Secondary | ICD-10-CM

## 2022-03-30 DIAGNOSIS — Z09 Encounter for follow-up examination after completed treatment for conditions other than malignant neoplasm: Secondary | ICD-10-CM | POA: Diagnosis present

## 2022-03-30 DIAGNOSIS — C169 Malignant neoplasm of stomach, unspecified: Secondary | ICD-10-CM

## 2022-03-30 DIAGNOSIS — D122 Benign neoplasm of ascending colon: Secondary | ICD-10-CM

## 2022-03-30 DIAGNOSIS — Z86010 Personal history of colonic polyps: Secondary | ICD-10-CM | POA: Diagnosis not present

## 2022-03-30 DIAGNOSIS — K319 Disease of stomach and duodenum, unspecified: Secondary | ICD-10-CM | POA: Diagnosis not present

## 2022-03-30 DIAGNOSIS — D123 Benign neoplasm of transverse colon: Secondary | ICD-10-CM

## 2022-03-30 DIAGNOSIS — K2970 Gastritis, unspecified, without bleeding: Secondary | ICD-10-CM | POA: Diagnosis not present

## 2022-03-30 DIAGNOSIS — C189 Malignant neoplasm of colon, unspecified: Secondary | ICD-10-CM | POA: Diagnosis not present

## 2022-03-30 DIAGNOSIS — K219 Gastro-esophageal reflux disease without esophagitis: Secondary | ICD-10-CM

## 2022-03-30 HISTORY — DX: Malignant (primary) neoplasm, unspecified: C80.1

## 2022-03-30 MED ORDER — PANTOPRAZOLE SODIUM 40 MG PO TBEC
40.0000 mg | DELAYED_RELEASE_TABLET | Freq: Two times a day (BID) | ORAL | 0 refills | Status: DC
  Filled 2022-03-30: qty 60, 30d supply, fill #0
  Filled 2022-04-27 (×2): qty 60, 30d supply, fill #1
  Filled 2022-05-20: qty 60, 30d supply, fill #2

## 2022-03-30 MED ORDER — SODIUM CHLORIDE 0.9 % IV SOLN: 500.0000 mL | INTRAVENOUS | Status: DC

## 2022-03-30 NOTE — Progress Notes (Signed)
Called to room to assist during endoscopic procedure.  Patient ID and intended procedure confirmed with present staff. Received instructions for my participation in the procedure from the performing physician.  

## 2022-03-30 NOTE — Patient Instructions (Addendum)
Handout on polyps, gastritis, diverticulosis, high-fiber, hemorrhoids given to patient. Await pathology results. Resume previous diet and continue present medications. Pick up prescription for Pantoprazole 40 mg twice a day for at least 10 weeks - pick up from [facility] [facility] Avoid all NSAID's (ie. Aspirin, naproxen, ibuprofen, or any other non-steroidal anti-inflammatory medications. Repeat EGD in 8-10 weeks Continue present medications Repeat colonoscopy will be determined based off of pathology results for surveillance     YOU HAD AN ENDOSCOPIC PROCEDURE TODAY AT [place]:   Refer to the procedure report that was given to you for any specific questions about what was found during the examination.  If the procedure report does not answer your questions, please call your gastroenterologist to clarify.  If you requested that your care partner not be given the details of your procedure findings, then the procedure report has been included in a sealed envelope for you to review at your convenience later.  YOU SHOULD EXPECT: Some feelings of bloating in the abdomen. Passage of more gas than usual.  Walking can help get rid of the air that was put into your GI tract during the procedure and reduce the bloating. If you had a lower endoscopy (such as a colonoscopy or flexible sigmoidoscopy) you may notice spotting of blood in your stool or on the toilet paper. If you underwent a bowel prep for your procedure, you may not have a normal bowel movement for a few days.  Please Note:  You might notice some irritation and congestion in your nose or some drainage.  This is from the oxygen used during your procedure.  There is no need for concern and it should clear up in a day or so.  SYMPTOMS TO REPORT IMMEDIATELY:  Following lower endoscopy (colonoscopy or flexible sigmoidoscopy):  Excessive amounts of blood in the stool  Significant tenderness or worsening of abdominal  pains  Swelling of the abdomen that is new, acute  Fever of 100F or higher  Following upper endoscopy (EGD)  Vomiting of blood or coffee ground material  New chest pain or pain under the shoulder blades  Painful or persistently difficult swallowing  New shortness of breath  Fever of 100F or higher  Black, tarry-looking stools  For urgent or emergent issues, a gastroenterologist can be reached at any hour by calling [phone]. Do not use MyChart messaging for urgent concerns.    DIET:  We do recommend a small meal at first, but then you may proceed to your regular diet.  Drink plenty of fluids but you should avoid alcoholic beverages for 24 hours.  ACTIVITY:  You should plan to take it easy for the rest of today and you should NOT DRIVE or use heavy machinery until tomorrow (because of the sedation medicines used during the test).    FOLLOW UP: Our staff will call the number listed on your records the next business day following your procedure.  We will call around 7:15- 8:00 am to check on you and address any questions or concerns that you may have regarding the information given to you following your procedure. If we do not reach you, we will leave a message.     If any biopsies were taken you will be contacted by phone or by letter within the next 1-3 weeks.  Please call us at [phone] if you have not heard about the biopsies in 3 weeks.    SIGNATURES/CONFIDENTIALITY: You and/or your care partner have signed  paperwork which will be entered into your electronic medical record.  These signatures attest to the fact that that the information above on your After Visit Summary has been reviewed and is understood.  Full responsibility of the confidentiality of this discharge information lies with you and/or your care-partner.

## 2022-03-30 NOTE — Op Note (Signed)
[place] Patient Name: Sheryl [name] Procedure Date: 03/30/2022 11:22 AM MRN: [id number] Endoscopist: Thornton Park MD, MD, [id number] Age: 56 Referring MD:  Date of Birth: 01/27/1966 Gender: Female Account #: [id number] Procedure:                Upper GI endoscopy Indications:              Abdominal pain, Vomiting, Weight loss of 45 pounds Medicines:                Monitored Anesthesia Care Procedure:                Pre-Anesthesia Assessment:                           - Prior to the procedure, a History and Physical                            was performed, and patient medications and                            allergies were reviewed. The patient's tolerance of                            previous anesthesia was also reviewed. The risks                            and benefits of the procedure and the sedation                            options and risks were discussed with the patient.                            All questions were answered, and informed consent                            was obtained. Prior Anticoagulants: The patient has                            taken no anticoagulant or antiplatelet agents. ASA                            Grade Assessment: II - A patient with mild systemic                            disease. After reviewing the risks and benefits,                            the patient was deemed in satisfactory condition to                            undergo the procedure.                           After obtaining informed consent, the endoscope was  passed under direct vision. Throughout the                            procedure, the patient's blood pressure, pulse, and                            oxygen saturations were monitored continuously. The                            GIF [id number] #[id number] was introduced through the                            mouth, and advanced to the third part of duodenum.                             The upper GI endoscopy was accomplished without                            difficulty. The patient tolerated the procedure                            well. Scope In: Scope Out: Findings:                 The esophagus was normal. The z-line is located 36                            cm from the incisors.                           A few small sessile polyps with no bleeding and no                            stigmata of recent bleeding were found in the                            gastric fundus and body. One polyp was more firm                            than the others and had an ulcerated tip. Biopsies                            were taken from multiple polyps with a cold forceps                            for histology. Estimated blood loss was minimal.                           Diffuse moderate inflammation characterized by                            congestion (edema), friability and granularity was  found in the cardia, in the gastric fundus and in                            the gastric body. There were associated erosions in                            multiple places. Biopsies were taken from the                            antrum, body, and fundus with a cold forceps for                            histology. Estimated blood loss was minimal.                           One non-bleeding cratered gastric ulcer with no                            stigmata of bleeding was found on the greater                            curvature of the stomach. The lesion was 6 mm in                            largest dimension. The mucosa around the ulcer was                            heaped and led to some deformity in the antrum.                            Biopsies were taken with a cold forceps for                            histology. Estimated blood loss was minimal.                           The examined duodenum was normal. Biopsies were                            taken with  a cold forceps for histology. Estimated                            blood loss was minimal. Complications:            No immediate complications. Estimated Blood Loss:     Estimated blood loss was minimal. Impression:               - Normal esophagus.                           - A few gastric polyps. Biopsied.                           - Gastritis. Biopsied.                           -  Non-bleeding gastric ulcer with no stigmata of                            bleeding. Biopsied.                           - Normal examined duodenum. Biopsied. Recommendation:           - Patient has a contact number available for                            emergencies. The signs and symptoms of potential                            delayed complications were discussed with the                            patient. Return to normal activities tomorrow.                            Written discharge instructions were provided to the                            patient.                           - Resume previous diet.                           - Continue present medications.                           - Start pantoprazole 40 mg BID for at least 10                            weeks.                           - Avoid all NSAIDs.                           - Await pathology results.                           - Repeat EGD in 8-10 weeks.                           - Colonoscopy today as previously planned. Thornton Park MD, MD 03/30/2022 12:40:57 PM This report has been signed electronically.

## 2022-03-30 NOTE — Progress Notes (Signed)
Report to pacu rn. Vss. Care resumed by rn. 

## 2022-03-30 NOTE — Progress Notes (Signed)
Referring Provider: Janith Lima, MD Primary Care Physician:  Janith Lima, MD   Indication for EGD:  Epigastric pain and vomiting Indication for Colonoscopy:  History of colon polyps   IMPRESSION:  45 pounds weight loss Epigastric pain and vomiting History of colon polyps  PLAN: EGD and Colonoscopy in the [facility] [place] today   HPI: Sheryl [name] presents for endoscopic evaluation. 56 year old female with a history of GERD with epigastric pain for  few months and vomiting x 6 to 7 months without nausea. Not explained by CT or ultrasound. She has lost 45 pounds since the symptoms started.    She has a history of colon polyps.  Patient reported last colonoscopy by Dr. Oletta Lamas in 2011 identified a few polyps removed from the colon.  Father with history of colon polyps.  Past Medical History:  Diagnosis Date   Blood transfusion without reported diagnosis    had transfusion with hysterectomy   Cataract    Colon polyps 2012   Diabetes ([place]) 03/13/2021   Diabetes ([place]) 05/21/2019   Fibroid    GERD (gastroesophageal reflux disease)    H/O blood clots    History of hysterectomy    fibroids and heavy cycles   Hypertension     Past Surgical History:  Procedure Laterality Date   ABDOMINAL HYSTERECTOMY     COLONOSCOPY     UPPER GASTROINTESTINAL ENDOSCOPY      Current Outpatient Medications  Medication Sig Dispense Refill   amLODipine (NORVASC) 10 MG tablet TAKE 1 TABLET (10 MG TOTAL) BY MOUTH DAILY. 90 tablet 1   Bacillus Coagulans-Inulin (PROBIOTIC-PREBIOTIC) 1-250 BILLION-MG CAPS Take by mouth.     lansoprazole (PREVACID) 30 MG capsule Take 1 capsule (30 mg total) by mouth daily at 12 noon. 90 capsule 0   Misc Natural Products (ELDERBERRY/VITAMIN C/ZINC) CHEW Chew 50 mg by mouth.     Acetaminophen (TYLENOL EXTRA STRENGTH PO) Take 500 mg by mouth as needed.     Biotin 1000 MCG CHEW Chew by mouth.     Blood Glucose Monitoring Suppl (TRUE METRIX METER) w/Device KIT 1 kit by  Does not apply route 3 (three) times daily as needed. (Patient not taking: Reported on 02/26/2022) 1 kit 0   Cholecalciferol (VITAMIN D3) 125 MCG (5000 UT) TABS Take by mouth.     ondansetron (ZOFRAN-ODT) 4 MG disintegrating tablet Take 1 tablet (4 mg total) by mouth every 6 (six) hours. Dissolve one tab on tongue 30 minutes before each dose of bowel prep 2 tablet 0   rosuvastatin (CRESTOR) 5 MG tablet Take 1 tablet (5 mg total) by mouth daily. (Patient not taking: Reported on 02/26/2022) 90 tablet 1   Current Facility-Administered Medications  Medication Dose Route Frequency Provider Last Rate Last Admin   0.9 %  sodium chloride infusion  500 mL Intravenous Continuous Thornton Park, MD        Allergies as of 03/30/2022 - Review Complete 03/30/2022  Allergen Reaction Noted   Aspirin Anaphylaxis 10/29/2012   Cyclobenzaprine Anaphylaxis 10/29/2012   Naproxen sodium Anaphylaxis 09/14/2010   Zithromax [azithromycin dihydrate] Anaphylaxis 09/14/2010   Dilaudid [hydromorphone] Nausea And Vomiting 11/21/2020    Family History  Problem Relation Age of Onset   Stroke Mother    Diabetes Mother    Hypertension Mother    Stroke Father    Heart attack Maternal Grandmother    Breast cancer Paternal Grandmother    Diabetes Other    Hypertension Other    Stroke Other  Cancer Other    Heart attack Other    Esophageal cancer Neg Hx    Liver disease Neg Hx    Stomach cancer Neg Hx    Colon cancer Neg Hx    Rectal cancer Neg Hx      Physical Exam: General:   Alert,  well-nourished, pleasant and cooperative in NAD Head:  Normocephalic and atraumatic. Eyes:  Sclera clear, no icterus.   Conjunctiva pink. Mouth:  No deformity or lesions.   Neck:  Supple; no masses or thyromegaly. Lungs:  Clear throughout to auscultation.   No wheezes. Heart:  Regular rate and rhythm; no murmurs. Abdomen:  Soft, non-tender, nondistended, normal bowel sounds, no rebound or guarding.  Msk:  Symmetrical.  No boney deformities LAD: No inguinal or umbilical LAD Extremities:  No clubbing or edema. Neurologic:  Alert and  oriented x4;  grossly nonfocal Skin:  No obvious rash or bruise. Psych:  Alert and cooperative. Normal mood and affect.     Studies/Results: No results found.    Azaylah Stailey L. Tarri Glenn, MD, MPH 03/30/2022, 11:23 AM

## 2022-03-30 NOTE — Op Note (Signed)
[facility] Patient Name: Sheryl [name] Procedure Date: 03/30/2022 11:22 AM MRN: [id number] Endoscopist: Thornton Park MD, MD, [id number] Age: 56 Referring MD:  Date of Birth: August 18, 1965 Gender: Female Account #: [id number] Procedure:                Colonoscopy Indications:              High risk colon cancer surveillance: Personal                            history of colonic polyps                           Polyps on colonoscopy with Dr. Oletta Lamas 2011                           Father with history of colon polyps Medicines:                Monitored Anesthesia Care Procedure:                Pre-Anesthesia Assessment:                           - Prior to the procedure, a History and Physical                            was performed, and patient medications and                            allergies were reviewed. The patient's tolerance of                            previous anesthesia was also reviewed. The risks                            and benefits of the procedure and the sedation                            options and risks were discussed with the patient.                            All questions were answered, and informed consent                            was obtained. Prior Anticoagulants: The patient has                            taken no anticoagulant or antiplatelet agents. ASA                            Grade Assessment: II - A patient with mild systemic                            disease. After reviewing the risks and benefits,  the patient was deemed in satisfactory condition to                            undergo the procedure.                           After obtaining informed consent, the colonoscope                            was passed under direct vision. Throughout the                            procedure, the patient's blood pressure, pulse, and                            oxygen saturations were monitored continuously. The                             CF [id number] #[id number] was introduced through the anus                            and advanced to the 3 cm into the ileum. A second                            forward view of the right colon was performed. The                            colonoscopy was performed without difficulty. The                            patient tolerated the procedure well. The quality                            of the bowel preparation was good. The terminal                            ileum, ileocecal valve, appendiceal orifice, and                            rectum were photographed. Scope In: 11:57:25 AM Scope Out: 12:13:12 PM Scope Withdrawal Time: 0 hours 12 minutes 50 seconds  Total Procedure Duration: 0 hours 15 minutes 47 seconds  Findings:                 The perianal and digital rectal examinations were                            normal.                           Non-bleeding internal hemorrhoids were found.                           A few medium-mouthed and small-mouthed diverticula  were found in the sigmoid colon, descending colon                            and ascending colon.                           A 2 mm polyp was found in the distal transverse                            colon. The polyp was sessile. The polyp was removed                            with a cold snare. Resection and retrieval were                            complete. Estimated blood loss was minimal.                           A 3 mm polyp was found in the ascending colon. The                            polyp was sessile. The polyp was removed with a                            cold snare. Resection and retrieval were complete.                            Estimated blood loss was minimal.                           The exam was otherwise without abnormality on                            direct and retroflexion views. Complications:            No immediate complications. Estimated Blood  Loss:     Estimated blood loss was minimal. Impression:               - Non-bleeding internal hemorrhoids.                           - Diverticulosis in the sigmoid colon, in the                            descending colon and in the ascending colon.                           - One 2 mm polyp in the distal transverse colon,                            removed with a cold snare. Resected and retrieved.                           - One 3 mm polyp in the  ascending colon, removed                            with a cold snare. Resected and retrieved.                           - The examination was otherwise normal on direct                            and retroflexion views. Recommendation:           - Patient has a contact number available for                            emergencies. The signs and symptoms of potential                            delayed complications were discussed with the                            patient. Return to normal activities tomorrow.                            Written discharge instructions were provided to the                            patient.                           - Continue present medications.                           - Await pathology results.                           - Repeat colonoscopy date to be determined after                            pending pathology results are reviewed for                            surveillance.                           - Follow a high fiber diet. Drink at least 64                            ounces of water daily. Add a daily stool bulking                            agent such as psyllium (an exampled would be                            Metamucil).                           - Emerging  evidence supports eating a diet of                            fruits, vegetables, grains, calcium, and yogurt                            while reducing red meat and alcohol may reduce the                            risk of colon cancer.                            - Thank you for allowing me to be involved in your                            colon cancer prevention. Thornton Park MD, MD 03/30/2022 12:48:51 PM This report has been signed electronically.

## 2022-04-02 ENCOUNTER — Other Ambulatory Visit: Payer: Self-pay

## 2022-04-02 ENCOUNTER — Telehealth: Payer: Self-pay

## 2022-04-02 DIAGNOSIS — K259 Gastric ulcer, unspecified as acute or chronic, without hemorrhage or perforation: Secondary | ICD-10-CM

## 2022-04-02 DIAGNOSIS — K219 Gastro-esophageal reflux disease without esophagitis: Secondary | ICD-10-CM

## 2022-04-02 NOTE — Telephone Encounter (Signed)
  Follow up Call-     03/30/2022   10:37 AM  Call back number  Post procedure Call Back phone  # [phone]  Permission to leave phone message Yes     Patient questions:  Do you have a fever, pain , or abdominal swelling? No. Pain Score  0 *  Have you tolerated food without any problems? Yes.    Have you been able to return to your normal activities? Yes.    Do you have any questions about your discharge instructions: Diet   No. Medications  No. Follow up visit  No.  Do you have questions or concerns about your Care? No.  Actions: * If pain score is 4 or above: No action needed, pain <4.

## 2022-04-02 NOTE — Telephone Encounter (Signed)
Scheduled patient for EGD per procedure note by Dr. Tarri Glenn. Pt scheduled for 06/08/22 at 10:00 am in the [facility]. Amb ref placed. No blood thinners or diabetic. Instructions sent to mychart & advised she call back with any further questions.

## 2022-04-06 ENCOUNTER — Other Ambulatory Visit: Payer: Self-pay

## 2022-04-06 DIAGNOSIS — C169 Malignant neoplasm of stomach, unspecified: Secondary | ICD-10-CM

## 2022-04-10 ENCOUNTER — Ambulatory Visit (HOSPITAL_COMMUNITY): Payer: Commercial Managed Care - HMO

## 2022-04-10 NOTE — Telephone Encounter (Signed)
Spoke with patient & advised her that per pre-cert team her scan was still under medical review. CT has been rescheduled to 04/17/22. Advised her that per Dr. Tarri Glenn recommendations she should proceed with [facility] appointment tomorrow as planned. Pt verbalized all understanding.

## 2022-04-10 NOTE — Telephone Encounter (Signed)
Patient called requesting to speak with you for an update.

## 2022-04-10 NOTE — Telephone Encounter (Signed)
PT returning call for an update

## 2022-04-11 NOTE — Telephone Encounter (Signed)
Returned patient call & she was also on the other line with insurance company at the time. Per Christella Scheuermann, the scan would not be covered at [facility] unless an appeal is made, however would be covered at [facility] or GI. Scan has been rescheduled to [facility] for 04/17/22 at 11:30 am, arrive by 11:00 am, NPO 4 hours prior. Patient is aware. While we were on the phone she mentioned that she was nauseous & had some blood in her stool this morning. Advised that she make the surgeon aware today at her appointment with [facility].  For future references:  Spoke with Daymon Larsen from [place] at [phone] (number to call for site approval) or [phone] (number to call for appeal, option 4).   Approval # [id number]; authorized from 04/06/22-10/03/22

## 2022-04-11 NOTE — Telephone Encounter (Signed)
Patient is returning call with questions on appointment.

## 2022-04-12 ENCOUNTER — Telehealth: Payer: Self-pay

## 2022-04-12 DIAGNOSIS — C168 Malignant neoplasm of overlapping sites of stomach: Secondary | ICD-10-CM

## 2022-04-12 NOTE — Progress Notes (Unsigned)
[facility] [phone]   [phone]  CONSULT NOTE  REFERRING PHYSICIAN: Dr. Zenia Resides   REASON FOR CONSULTATION:  Gastric Cancer  HPI ANTRICE PAL is a 56 y.o. female with a medical history significant for GERD and hypertension is referred to the clinic for newly diagnosed gastric cancer. The patient denies history of diabetes as she is not on any medication although her last A1c was 6.8.   The patient states that she has been having some ongoing abdominal discomfort dating back to February 2023. Her PCP had arranged a CT of the abdomen/pelvis on 12/05/21 which was unremarkable except for a stable soft tissue nodule in the LUQ since 2007.  Patient states that this was evaluated and biopsied in the past.  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  Since September/October 2023, the patient reports "tremendous" weight loss.  She estimates she lost approximately 45 pounds.  She stated her baseline weight is 223 pounds.  She weighs 180 pounds today.  She had also been having episodes of vomiting without any nausea.   This led to the patient establishing care with [place] to oncology on 02/26/2022.  The patient has a history of reflux and is currently taking Lansoprazole 30 mg p.o. daily which she has been on for approximately 4 years.  The patient saw Dr. Tarri Glenn from gastroenterology on 03/30/22 for endoscopic evaluation with EGD and colonoscopy.   The EGD showed normal esophagus, diffuse inflammation, edema, friability, and granularity in the cardia, in the gastric fundus, and in the gastric body with associated erosions. There was also a non-bleeding cratered gastric ulcer in the greater curvature of the stomach measuring 6 mm in the largest dimension   The colonoscopy showed non-bleeding internal hemorrhoids, a few diverticula, and a couple of polyps.    The final pathology ([id number]) of the gastric  polyp, gastric fundus, and the greater curvature ulceration is consistent with adenocarcinoma with signet ring cell features.   Dr. Zenia Resides evaluated the patient on 04/11/22.  If her scans do not show any evidence of metastatic disease, then EUS will be required for local staging to determine if she will need neoadjuvant chemotherapy or will have surgery first. She is tentatively scheduled for EUS with Dr. Rush Landmark on 05/14/22.   She is scheduled for staging CT CAP on 04/16/22.   Overall, the patient is feeling fair today.  She continues to have intermittent epigastric pain that comes and goes.  She rates her pain a 5 out of 10 today.  She describes this as a dull discomfort.  If she eats something that upsets her stomach, she may develop a sharp pain.  She has not tried taking anything for the pain and states she has "learned to live with it". However,  she is going to try and pick up Tylenol today to see if it helps.  The patient knows not to take NSAIDs and states she is actually allergic to NSAIDs.  The pain is sometimes associated with food.   The patient denies any fever or chills.  Denies any shortness of breath or coughing.  The patient denies any nausea but does continue to have emesis.  Because of this, she has changed her eating habits and mostly eats 3 potatoes, avocado, broccoli which reportedly does not cause emesis.  Denies any diarrhea.  She reports that she has bowel movements frequently but sometimes they are firm.  She believes  for the 4 days following her colonoscopy that her stool was dark.  Denies any jaundice or itching.  Denies any odynophagia or dysphagia.  She does have an extensive family history for malignancy.  Her mother had multiple myeloma.  Her maternal aunt had breast cancer.  Her maternal uncle had gastric cancer.  Her grandmother had breast cancer.  She also had another family member with pancreatic cancer.  The patient's mother has had 11 strokes and has right-sided  paralysis and is blind.  The patient takes care of her mother who lives with her.  The patient's father had a stroke and atrial fibrillation.  The patient's siblings are healthy.  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  She is widowed.  She has 2 children.  She has a son that lives in [place] and a daughter that lives in [place].  She denies any tobacco, alcohol, or drug use.   HPI  Past Medical History:  Diagnosis Date   Blood transfusion without reported diagnosis    had transfusion with hysterectomy   Cataract    Colon polyps 2012   Diabetes ([place]) 03/13/2021   Diabetes ([place]) 05/21/2019   Fibroid    GERD (gastroesophageal reflux disease)    H/O blood clots    History of hysterectomy    fibroids and heavy cycles   Hypertension     Past Surgical History:  Procedure Laterality Date   ABDOMINAL HYSTERECTOMY     COLONOSCOPY     UPPER GASTROINTESTINAL ENDOSCOPY      Family History  Problem Relation Age of Onset   Stroke Mother    Diabetes Mother    Hypertension Mother    Stroke Father    Heart attack Maternal Grandmother    Breast cancer Paternal Grandmother    Diabetes Other    Hypertension Other    Stroke Other    Cancer Other    Heart attack Other    Esophageal cancer Neg Hx    Liver disease Neg Hx    Stomach cancer Neg Hx    Colon cancer Neg Hx    Rectal cancer Neg Hx     Social History Social History   Tobacco Use   Smoking status: Never   Smokeless tobacco: Never  Vaping Use   Vaping Use: Never used  Substance Use Topics   Alcohol use: No   Drug use: No    Allergies  Allergen Reactions   Aspirin Anaphylaxis   Cyclobenzaprine Anaphylaxis   Naproxen Sodium Anaphylaxis   Zithromax [Azithromycin Dihydrate] Anaphylaxis   Dilaudid [Hydromorphone] Nausea And Vomiting    Current Outpatient Medications  Medication Sig Dispense Refill   Acetaminophen (TYLENOL EXTRA STRENGTH PO) Take 500 mg by mouth as  needed.     amLODipine (NORVASC) 10 MG tablet TAKE 1 TABLET (10 MG TOTAL) BY MOUTH DAILY. 90 tablet 1   Bacillus Coagulans-Inulin (PROBIOTIC-PREBIOTIC) 1-250 BILLION-MG CAPS Take by mouth.     Biotin 1000 MCG CHEW Chew by mouth.     Blood Glucose Monitoring Suppl (TRUE METRIX METER) w/Device KIT 1 kit by Does not apply route 3 (three) times daily as needed. (Patient not taking: Reported on 02/26/2022) 1 kit 0   Cholecalciferol (VITAMIN D3) 125 MCG (5000 UT) TABS Take by mouth.     lansoprazole (PREVACID) 30 MG capsule Take 1 capsule (30 mg total) by mouth daily at 12 noon. 90 capsule 0   Misc Natural Products (ELDERBERRY/VITAMIN  C/ZINC) CHEW Chew 50 mg by mouth.     ondansetron (ZOFRAN-ODT) 4 MG disintegrating tablet Take 1 tablet (4 mg total) by mouth every 6 (six) hours. Dissolve one tab on tongue 30 minutes before each dose of bowel prep 2 tablet 0   pantoprazole (PROTONIX) 40 MG tablet Take 1 tablet (40 mg total) by mouth 2 (two) times daily. 180 tablet 0   rosuvastatin (CRESTOR) 5 MG tablet Take 1 tablet (5 mg total) by mouth daily. (Patient not taking: Reported on 02/26/2022) 90 tablet 1   Current Facility-Administered Medications  Medication Dose Route Frequency Provider Last Rate Last Admin   0.9 %  sodium chloride infusion  500 mL Intravenous Continuous [place], MD        REVIEW OF SYSTEMS:   Review of Systems  Constitutional: Positive for weight loss.  Negative for appetite change, chills, fatigue, and fever.  HENT: Negative for mouth sores, nosebleeds, sore throat and trouble swallowing.   Eyes: Negative for eye problems and icterus.  Respiratory: Negative for cough, hemoptysis, shortness of breath and wheezing.   Cardiovascular: Negative for chest pain and leg swelling.  Gastrointestinal: Positive for intermittent abdominal pain and vomiting.  Negative for constipation, diarrhea, and nausea.  Genitourinary: Negative for bladder incontinence, difficulty urinating,  dysuria, frequency and hematuria.   Musculoskeletal: Negative for back pain, gait problem, neck pain and neck stiffness.  Skin: Negative for itching and rash.  Neurological: Negative for dizziness, extremity weakness, gait problem, headaches, light-headedness and seizures.  Hematological: Negative for adenopathy. Does not bruise/bleed easily.  Psychiatric/Behavioral: Negative for confusion, depression and sleep disturbance. The patient is not nervous/anxious.     PHYSICAL EXAMINATION:  Last menstrual period 06/29/2010.  ECOG PERFORMANCE STATUS: 1  Physical Exam  Constitutional: Oriented to person, place, and time and well-developed, well-nourished, and in no distress.  HENT:  Head: Normocephalic and atraumatic.  Mouth/Throat: Oropharynx is clear and moist. No oropharyngeal exudate.  Eyes: Conjunctivae are normal. Right eye exhibits no discharge. Left eye exhibits no discharge. No scleral icterus.  Neck: Normal range of motion. Neck supple.  Cardiovascular: Normal rate, regular rhythm, normal heart sounds and intact distal pulses.   Pulmonary/Chest: Effort normal and breath sounds normal. No respiratory distress. No wheezes. No rales.  Abdominal: Soft. Bowel sounds are normal. Exhibits no distension and no mass. Tenderness to palpation over LUQ Musculoskeletal: Normal range of motion. Exhibits no edema.  Lymphadenopathy:    No cervical adenopathy.  Neurological: Alert and oriented to person, place, and time. Exhibits normal muscle tone. Gait normal. Coordination normal.  Skin: Skin is warm and dry. No rash noted. Not diaphoretic. No erythema. No pallor.  Psychiatric: Mood, memory and judgment normal.  Vitals reviewed.  LABORATORY DATA: Lab Results  Component Value Date   WBC 5.2 12/05/2021   HGB 13.7 12/05/2021   HCT 38.2 12/05/2021   MCV 83.8 12/05/2021   PLT 313 12/05/2021      Chemistry      Component Value Date/Time   NA 135 01/23/2022 1633   NA 137 12/20/2020 0957    K 3.8 01/23/2022 1633   CL 99 01/23/2022 1633   CO2 28 01/23/2022 1633   BUN 20 01/23/2022 1633   BUN 11 12/20/2020 0957   CREATININE 0.88 01/23/2022 1633      Component Value Date/Time   CALCIUM 9.9 01/23/2022 1633   ALKPHOS 73 01/23/2022 1633   AST 29 01/23/2022 1633   ALT 29 01/23/2022 1633   BILITOT 0.6 01/23/2022 1633  BILITOT 0.5 12/20/2020 0957       RADIOGRAPHIC STUDIES: No results found.  ASSESSMENT: This is a very pleasant 56 year old female with:  Gastric cancer, Tx, Nx, Mx -She presented with weight loss, abdominal pain, and vomiting since February 2023 -Underwent EGD under the care of Dr. Tarri Glenn on 03/30/22 which revealed diffuse inflammation, edema, friability, and granularity in the cardia, in the gastric fundus, and in the gastric body with associated erosions and a non-bleeding cratered gastric ulcer in the greater curvature of the stomach measuring 6 mm in the largest dimension  -The final pathology ([id number]) of the gastric polyp, gastric fundus, and the greater curvature ulceration is consistent with adenocarcinoma with signet ring cell features. -Will request MMR and HER-2 today -She saw Dr. Zenia Resides from surgery earlier this week. -The patient was seen by Dr. Burr Medico today.  Dr. Burr Medico had a lengthy discussion with the patient today about her current condition and recommended further work-up. -The patient will need to complete the staging work-up with a CT CAP on 04/16/22 as scheduled.  -If her restaging CT scan does not show any signs of metastases or lymphadenopathy then we would recommend EUS for staging. -The patient tentatively has an appointment scheduled on 05/14/22 with Dr. Rush Landmark.  -The patient understands that in certain situations based on the staging that some patients may require neoadjuvant chemotherapy prior to surgery. -We will discuss the patient's case at the multidisciplinary GI conference next week -Will arrange for baseline CEA, CA 19.9,  CBC, CMP, iron studies, ferritin to be drawn today. -Dr. Burr Medico will call the patient once she has the results of her CT scan to discuss the next steps.  2. Weight loss/Abdominal Pain/Vomiting -She lost 45 pounds since September/October 2023 -Referral to nutritionist -Patient has been having intermittent epigastric pain February 2023 -On pantoprazole 40 mg BID for gastritis -Has not tried taking anything for abdominal pain.  She is going to try and pick up Tylenol today. She knows to avoid NSAIDs. -Knows to call us if she needs stronger medication for her abdominal pain. -Zofran for nausea although the patient reports she often has emesis without any associated nausea.  3. Genetics -Family history of malignancy, mostly maternal side, with breast and gastric cancer.  -The patient's mother had multiple myeloma -Referral to genetic counseling.  She has 2 children  PLAN: -Request MMR and HER-2 -Referral to genetics -Referral to nutrition -CT chest abdomen pelvis 04/16/2022 -Follow-up phone call on Tuesday 1219 -CBC, CMP, iron studies, ferritin, CEA, and CA 19-9 today -Tentatively scheduled EUS on 05/14/2022 with Dr. Rush Landmark   The patient voices understanding of current disease status and treatment options and is in agreement with the current care plan.  All questions were answered. The patient knows to call the clinic with any problems, questions or concerns. We can certainly see the patient much sooner if necessary.  Thank you so much for allowing me to participate in the care of NIRA VISSCHER. I will continue to follow up the patient with you and assist in her care.  Disclaimer: This note was dictated with voice recognition software. Similar sounding words can inadvertently be transcribed and may not be corrected upon review.   Roen Macgowan L Tanieka Pownall April 12, 2022, 1:12 PM  Addendum I have seen the patient, examined her. I agree with the assessment and and plan and  have edited the notes.   56 yo female, with PMH of HTN and GERD, further by Dr. Zenia Resides for newly diagnosed  gastric cancer.  She has been having intermittent abdominal, nausea, vomiting, and 40 to 50 pound weight loss since early 2023.  Recent EGD showed diffuse inflammation in stomach, and a small ulcer in greater curvature.  3 biopsies around the ulcer showed adenocarcinoma with signet ring features.  Her staging CT scan is scheduled for next Monday.  We discussed treatment options for now metastatic gastric cancer, and indication for neoadjuvant or adjuvant chemotherapy.  If her CT scan is negative for nodal or distant metastasis, she will need EUS for staging, to see if she needs neoadjuvant chemotherapy.  We will request MMR and HER2 on her biopsies, all questions were answered. We will call her next week after CT scan results return.  Truitt Merle MD 04/13/2022

## 2022-04-12 NOTE — Telephone Encounter (Signed)
Dr Burr Medico would you like the Jan 15 date or is that too far out?

## 2022-04-12 NOTE — Progress Notes (Signed)
I spoke with Ms Throckmorton.  She is agreeable for consultation 04/14/2023 at 1100 to see Cassie Heilingoetter PA and Dr Burr Medico.  She knows to arrive at 1045 for registration.  She is aware of our location, free valet parking, and mandatory masking.  I recommended she have someone accompany her.  All questions were answered.  She verbalized understanding.  We received this referral via in basket message from Dr Michaelle Birks.  I will place an order for this referral.

## 2022-04-12 NOTE — Telephone Encounter (Signed)
-----   Message from Irving Copas., MD sent at 04/12/2022  4:03 AM EST ----- Regarding: RE: New gastric cancer Dear all, Happy to be available. Biggest issue is I have no availability until the middle portion of January for procedures. I will have Narjis Mira go ahead and save my next available EUS slot date; but if it is too far out, you all would need to ask [facility] or [facility] if they have availability sooner or send patient out. GM  Jesselee Poth, Please offer this patient my January 15 EUS slot.  This will be canceled if imaging is concerning. Thanks. GM  ----- Message ----- From: Truitt Merle, MD Sent: 04/11/2022   4:58 PM EST To: Dwan Bolt, MD; Irving Copas., MD; # Subject: RE: New gastric cancer                         [place],   Sure, will get her in soon.   [place], please call pt to see if she is able to come in see [facility] tomorrow or Cassie on Friday.   Krista Blue ----- Message ----- From: Dwan Bolt, MD Sent: 04/11/2022   2:48 PM EST To: Truitt Merle, MD; Irving Copas., MD; # Subject: New gastric cancer                             Hello all, I saw this patient today for a newly-diagnosed gastric cancer. She is scheduled for staging scans next week. Krista Blue she has not seen an oncologist yet, would you be willing to see her? I can send a referral. If scans show no metastatic disease, Gabe would you be willing to do an EUS for local staging? She recently had her EGD by Dr. Tarri Glenn. Santiago Glad can she please be added for conference next week? Thanks, [place]

## 2022-04-13 ENCOUNTER — Inpatient Hospital Stay: Payer: Commercial Managed Care - HMO

## 2022-04-13 ENCOUNTER — Encounter: Payer: Self-pay | Admitting: Physician Assistant

## 2022-04-13 ENCOUNTER — Other Ambulatory Visit: Payer: Self-pay

## 2022-04-13 ENCOUNTER — Inpatient Hospital Stay: Payer: Commercial Managed Care - HMO | Attending: Physician Assistant | Admitting: Physician Assistant

## 2022-04-13 VITALS — BP 117/82 | HR 66 | Temp 98.3°F | Resp 16 | Wt 180.0 lb

## 2022-04-13 DIAGNOSIS — C168 Malignant neoplasm of overlapping sites of stomach: Secondary | ICD-10-CM | POA: Diagnosis not present

## 2022-04-13 DIAGNOSIS — R110 Nausea: Secondary | ICD-10-CM | POA: Diagnosis not present

## 2022-04-13 DIAGNOSIS — Z79899 Other long term (current) drug therapy: Secondary | ICD-10-CM | POA: Diagnosis not present

## 2022-04-13 DIAGNOSIS — R109 Unspecified abdominal pain: Secondary | ICD-10-CM | POA: Diagnosis not present

## 2022-04-13 DIAGNOSIS — R634 Abnormal weight loss: Secondary | ICD-10-CM | POA: Insufficient documentation

## 2022-04-13 DIAGNOSIS — C169 Malignant neoplasm of stomach, unspecified: Secondary | ICD-10-CM

## 2022-04-13 DIAGNOSIS — Z800 Family history of malignant neoplasm of digestive organs: Secondary | ICD-10-CM

## 2022-04-13 DIAGNOSIS — Z803 Family history of malignant neoplasm of breast: Secondary | ICD-10-CM

## 2022-04-13 LAB — CMP (CANCER CENTER ONLY)
ALT: 24 U/L (ref 0–44)
AST: 25 U/L (ref 15–41)
Albumin: 4.6 g/dL (ref 3.5–5.0)
Alkaline Phosphatase: 110 U/L (ref 38–126)
Anion gap: 4 — ABNORMAL LOW (ref 5–15)
BUN: 11 mg/dL (ref 6–20)
CO2: 32 mmol/L (ref 22–32)
Calcium: 10.4 mg/dL — ABNORMAL HIGH (ref 8.9–10.3)
Chloride: 104 mmol/L (ref 98–111)
Creatinine: 0.76 mg/dL (ref 0.44–1.00)
GFR, Estimated: >60 mL/min (ref >60)
Glucose, Bld: 96 mg/dL (ref 70–99)
Potassium: 3.8 mmol/L (ref 3.5–5.1)
Sodium: 140 mmol/L (ref 135–145)
Total Bilirubin: 0.8 mg/dL (ref 0.3–1.2)
Total Protein: 7.3 g/dL (ref 6.5–8.1)

## 2022-04-13 LAB — CBC WITH DIFFERENTIAL (CANCER CENTER ONLY)
Abs Immature Granulocytes: 0 10*3/uL (ref 0.00–0.07)
Basophils Absolute: 0 10*3/uL (ref 0.0–0.1)
Basophils Relative: 1 %
Eosinophils Absolute: 0.1 10*3/uL (ref 0.0–0.5)
Eosinophils Relative: 2 %
HCT: 42 % (ref 36.0–46.0)
Hemoglobin: 14.5 g/dL (ref 12.0–15.0)
Immature Granulocytes: 0 %
Lymphocytes Relative: 24 %
Lymphs Abs: 1 10*3/uL (ref 0.7–4.0)
MCH: 29.4 pg (ref 26.0–34.0)
MCHC: 34.5 g/dL (ref 30.0–36.0)
MCV: 85 fL (ref 80.0–100.0)
Monocytes Absolute: 0.3 10*3/uL (ref 0.1–1.0)
Monocytes Relative: 7 %
Neutro Abs: 2.6 10*3/uL (ref 1.7–7.7)
Neutrophils Relative %: 66 %
Platelet Count: 288 10*3/uL (ref 150–400)
RBC: 4.94 MIL/uL (ref 3.87–5.11)
RDW: 13.2 % (ref 11.5–15.5)
WBC Count: 4 10*3/uL (ref 4.0–10.5)
nRBC: 0 % (ref 0.0–0.2)

## 2022-04-13 LAB — FERRITIN: Ferritin: 128 ng/mL (ref 11–307)

## 2022-04-13 LAB — CEA (ACCESS): CEA (CHCC): <1 ng/mL (ref 0.00–5.00)

## 2022-04-13 LAB — IRON AND IRON BINDING CAPACITY (CC-WL,HP ONLY)
Iron: 111 ug/dL (ref 28–170)
Saturation Ratios: 38 % — ABNORMAL HIGH (ref 10.4–31.8)
TIBC: 295 ug/dL (ref 250–450)
UIBC: 184 ug/dL (ref 148–442)

## 2022-04-13 NOTE — Telephone Encounter (Signed)
EUS scheduled, pt instructed and medications reviewed.  Patient instructions mailed to home.  Patient to call with any questions or concerns.  

## 2022-04-13 NOTE — Patient Instructions (Addendum)
  It was very nice meeting you today.  I know we covered a lot of important information.  Here is a summary of the main take away points. -When Dr. Tarri Glenn performed the procedure called an EGD, this involves going down into your stomach with the camera.  She took a biopsy/piece of the stomach and a pathologist looked at under microscope.  3 of the biopsies near each other showed stomach cancer called adenocarcinoma.  Stomach cancer is also called gastric cancer.  I have attached a handout for you to read more information about this on this after visit summary. -In order to determine how you should best be treated, we need to complete the "staging" workup.  As we discussed at the appointment today, there are 3 things that we assess when determining the stage. 1)  How deep this the tumor invaded into the lining of the stomach? (EUS tells [place] this-see below) 2) there are any suspicious lymph nodes involved 3) Has the cancer spread outside of the stomach to any other parts of the body? - Since it has been several months since you had a CT scan, we need a repeat CT scan of the chest, abdomen, and pelvis.  We want to ensure that there are no suspicious looking lymph nodes or any areas where the cancer may have spread outside of the stomach. Some patients may require an additional scan called a PET scan to further evaluate certain areas that may be suspicious. -If it does not look like there is any suspicious lymph nodes or any spread of the cancer, we will likely reach out to one of the 3 doctors in the area (Dr. Rush Landmark, Dr. Watt Climes, and Dr. Paulita Fujita) performed this procedure.  This procedure is called an EUS.  This is very similar to the EGD you just had performed but instead of using a camera they will use an ultrasound machine.  This helps him assess how deep the tumor goes into the lining of the stomach.  If there are suspicious lymph nodes or if it looks like the tumor invades deep into the lining of the stomach,  he may require chemotherapy prior to undergoing surgery.  Most of the chemotherapy regimens are IV medications. -Have placed a few referrals.  I have rotated to a genetic counselor due to your family history of cancer.  They are going to test several genes to ensure you did not inherit a genetic mutation that has predispose you to developing cancer.  It is important to know this information because if this is positive, then your children should be tested.  I am also can refer you to member the nutritionist team. -Please try Tylenol to see if that helps with your abdominal pain.  As we discussed avoid aspirin and NSAIDs.  If Tylenol is not helping her pain please call us back and we can prescribe something stronger. -We will know more information on Tuesday after your CT scan and go over this with you more detail. -You also can discuss your situation/case at the multidisciplinary conference next week.

## 2022-04-13 NOTE — Progress Notes (Signed)
I met with Sheryl [name]  after  her consultation with Cassie Heilingotter, PA-C and  Dr Burr Medico.  I explained my role as a nurse navigator and provided my contact information. I explained the services provided at [facility] and provided written information.  I explained the alight grant and let  her know one of the financial advisors will reach out to  her at the time of her chemo education class., should she need chemotherapy treatment. I briefly explained insertion and care of a port a cath.  I showed a sample of the port a cath.  All questions were answered. She verbalized understanding.

## 2022-04-13 NOTE — Telephone Encounter (Signed)
EUS has been scheduled for 05/14/22 at 2 pm   Left message on machine to call back

## 2022-04-14 LAB — CANCER ANTIGEN 19-9: CA 19-9: 14 U/mL (ref 0–35)

## 2022-04-16 ENCOUNTER — Ambulatory Visit
Admission: RE | Admit: 2022-04-16 | Discharge: 2022-04-16 | Disposition: A | Discharge status: Home / Self Care | Payer: Commercial Managed Care - HMO | Source: Ambulatory Visit | Attending: Gastroenterology | Admitting: Gastroenterology

## 2022-04-16 DIAGNOSIS — C169 Malignant neoplasm of stomach, unspecified: Secondary | ICD-10-CM

## 2022-04-16 MED ORDER — IOPAMIDOL (ISOVUE-300) INJECTION 61%
100.0000 mL | Freq: Once | INTRAVENOUS | Status: AC | PRN
  Administered 2022-04-16: 100 mL via INTRAVENOUS

## 2022-04-17 ENCOUNTER — Ambulatory Visit (HOSPITAL_COMMUNITY): Payer: Commercial Managed Care - HMO

## 2022-04-17 ENCOUNTER — Encounter (HOSPITAL_COMMUNITY): Payer: Self-pay

## 2022-04-17 NOTE — Progress Notes (Signed)
I spoke with Ms Sheryl [name] and let her know her CT scan did not show any distant metastatic disease.  I told her we are working on getting her EUS scheduled sooner.  I told her she will hear from Dr Mansouraty's office if this is possible.  All questions were answered.  She verbalized understanding.

## 2022-04-18 ENCOUNTER — Other Ambulatory Visit: Payer: Self-pay

## 2022-04-18 ENCOUNTER — Telehealth: Payer: Self-pay

## 2022-04-18 NOTE — Telephone Encounter (Signed)
The pt appt has been moved to 12/21 at 930 am   The pt has been advised and agrees to the new date and time.  She has been re instructed and all questions answered.

## 2022-04-18 NOTE — Progress Notes (Signed)
The proposed treatment discussed in conference is for discussion purpose only and is not a binding recommendation.  The patients have not been physically examined, or presented with their treatment options.  Therefore, final treatment plans cannot be decided.  

## 2022-04-18 NOTE — Telephone Encounter (Signed)
Sheryl [name], Great work. Thanks. GM

## 2022-04-18 NOTE — Telephone Encounter (Signed)
-----   Message from Irving Copas., MD sent at 04/18/2022  8:42 AM EST ----- Regarding: Moving up EUS Sheryl [name], This patient's case was discussed at multidisciplinary conference today. She already has a scheduled EUS for January 15 but now that we have an opening for EUS tomorrow, this patient should be able to be added on if she can make that happen.  She just has to be n.p.o. at midnight. Please offer her this if she is able to make it otherwise we will keep her on the list for continued cancellations. Please let Dr. Burr Medico and I know what she decides. Thanks. GM

## 2022-04-19 ENCOUNTER — Ambulatory Visit (HOSPITAL_BASED_OUTPATIENT_CLINIC_OR_DEPARTMENT_OTHER): Payer: Commercial Managed Care - HMO | Admitting: Certified Registered Nurse Anesthetist

## 2022-04-19 ENCOUNTER — Other Ambulatory Visit: Payer: Self-pay

## 2022-04-19 ENCOUNTER — Encounter (HOSPITAL_COMMUNITY)
Admission: RE | Disposition: A | Discharge status: Home / Self Care | Payer: Self-pay | Source: Home / Self Care | Attending: Gastroenterology

## 2022-04-19 ENCOUNTER — Ambulatory Visit (HOSPITAL_COMMUNITY): Payer: Commercial Managed Care - HMO | Admitting: Certified Registered Nurse Anesthetist

## 2022-04-19 ENCOUNTER — Encounter (HOSPITAL_COMMUNITY): Payer: Self-pay | Admitting: Gastroenterology

## 2022-04-19 ENCOUNTER — Ambulatory Visit (HOSPITAL_COMMUNITY)
Admission: RE | Admit: 2022-04-19 | Discharge: 2022-04-19 | Disposition: A | Discharge status: Home / Self Care | Payer: Commercial Managed Care - HMO | Attending: Gastroenterology | Admitting: Gastroenterology

## 2022-04-19 DIAGNOSIS — K254 Chronic or unspecified gastric ulcer with hemorrhage: Secondary | ICD-10-CM | POA: Diagnosis not present

## 2022-04-19 DIAGNOSIS — I10 Essential (primary) hypertension: Secondary | ICD-10-CM | POA: Insufficient documentation

## 2022-04-19 DIAGNOSIS — K449 Diaphragmatic hernia without obstruction or gangrene: Secondary | ICD-10-CM

## 2022-04-19 DIAGNOSIS — K317 Polyp of stomach and duodenum: Secondary | ICD-10-CM | POA: Insufficient documentation

## 2022-04-19 DIAGNOSIS — K222 Esophageal obstruction: Secondary | ICD-10-CM | POA: Insufficient documentation

## 2022-04-19 DIAGNOSIS — C169 Malignant neoplasm of stomach, unspecified: Secondary | ICD-10-CM | POA: Diagnosis present

## 2022-04-19 DIAGNOSIS — K259 Gastric ulcer, unspecified as acute or chronic, without hemorrhage or perforation: Secondary | ICD-10-CM

## 2022-04-19 DIAGNOSIS — K3189 Other diseases of stomach and duodenum: Secondary | ICD-10-CM

## 2022-04-19 DIAGNOSIS — Z794 Long term (current) use of insulin: Secondary | ICD-10-CM

## 2022-04-19 DIAGNOSIS — E119 Type 2 diabetes mellitus without complications: Secondary | ICD-10-CM | POA: Insufficient documentation

## 2022-04-19 DIAGNOSIS — K219 Gastro-esophageal reflux disease without esophagitis: Secondary | ICD-10-CM | POA: Diagnosis not present

## 2022-04-19 DIAGNOSIS — K2970 Gastritis, unspecified, without bleeding: Secondary | ICD-10-CM | POA: Insufficient documentation

## 2022-04-19 HISTORY — PX: EUS: SHX5427

## 2022-04-19 HISTORY — PX: POLYPECTOMY: SHX5525

## 2022-04-19 HISTORY — PX: ESOPHAGOGASTRODUODENOSCOPY (EGD) WITH PROPOFOL: SHX5813

## 2022-04-19 HISTORY — PX: BIOPSY: SHX5522

## 2022-04-19 SURGERY — ESOPHAGOGASTRODUODENOSCOPY (EGD) WITH PROPOFOL
Anesthesia: Monitor Anesthesia Care

## 2022-04-19 MED ORDER — PROPOFOL 10 MG/ML IV BOLUS
INTRAVENOUS | Status: DC | PRN
  Administered 2022-04-19: 20 mg via INTRAVENOUS
  Administered 2022-04-19: 50 mg via INTRAVENOUS
  Administered 2022-04-19: 20 mg via INTRAVENOUS

## 2022-04-19 MED ORDER — PROPOFOL 500 MG/50ML IV EMUL
INTRAVENOUS | Status: DC | PRN
  Administered 2022-04-19: 150 ug/kg/min via INTRAVENOUS

## 2022-04-19 MED ORDER — LACTATED RINGERS IV SOLN: INTRAVENOUS | Status: DC | PRN

## 2022-04-19 MED ORDER — PHENYLEPHRINE 80 MCG/ML (10ML) SYRINGE FOR IV PUSH (FOR BLOOD PRESSURE SUPPORT)
PREFILLED_SYRINGE | INTRAVENOUS | Status: DC | PRN
  Administered 2022-04-19 (×2): 80 ug via INTRAVENOUS

## 2022-04-19 MED ORDER — SODIUM CHLORIDE 0.9 % IV SOLN: INTRAVENOUS | Status: DC

## 2022-04-19 MED ORDER — ONDANSETRON HCL 4 MG/2ML IJ SOLN
INTRAMUSCULAR | Status: DC | PRN
  Administered 2022-04-19: 4 mg via INTRAVENOUS

## 2022-04-19 MED ORDER — SUCRALFATE 1 G PO TABS
1.0000 g | ORAL_TABLET | Freq: Two times a day (BID) | ORAL | 6 refills | Status: DC
  Filled 2022-04-19 (×2): qty 60, 30d supply, fill #0
  Filled 2022-05-20: qty 60, 30d supply, fill #1
  Filled 2022-06-19: qty 60, 30d supply, fill #2
  Filled 2022-07-22: qty 60, 30d supply, fill #3
  Filled 2022-08-26 – 2022-08-27 (×2): qty 60, 30d supply, fill #4
  Filled 2022-09-27: qty 60, 30d supply, fill #5
  Filled 2022-10-28: qty 60, 30d supply, fill #6

## 2022-04-19 MED ORDER — LIDOCAINE 2% (20 MG/ML) 5 ML SYRINGE
INTRAMUSCULAR | Status: DC | PRN
  Administered 2022-04-19: 100 mg via INTRAVENOUS

## 2022-04-19 MED ORDER — PROPOFOL 500 MG/50ML IV EMUL
INTRAVENOUS | Status: AC
  Filled 2022-04-19: qty 50

## 2022-04-19 MED ORDER — DEXMEDETOMIDINE HCL IN NACL 80 MCG/20ML IV SOLN
INTRAVENOUS | Status: AC
  Filled 2022-04-19: qty 20

## 2022-04-19 MED ORDER — DEXMEDETOMIDINE HCL IN NACL 80 MCG/20ML IV SOLN
INTRAVENOUS | Status: DC | PRN
  Administered 2022-04-19: 4 ug via BUCCAL

## 2022-04-19 NOTE — Anesthesia Procedure Notes (Signed)
Procedure Name: MAC Date/Time: 04/19/2022 9:00 AM  Performed by: Deliah Boston, CRNAPre-anesthesia Checklist: Patient identified, Emergency Drugs available, Suction available and Patient being monitored Patient Re-evaluated:Patient Re-evaluated prior to induction Oxygen Delivery Method: Simple face mask Preoxygenation: Pre-oxygenation with 100% oxygen Placement Confirmation: breath sounds checked- equal and bilateral and positive ETCO2

## 2022-04-19 NOTE — Transfer of Care (Signed)
Immediate Anesthesia Transfer of Care Note  [name]  Procedure(s) Performed: Procedure(s): UPPER ENDOSCOPIC ULTRASOUND (EUS) RADIAL (N/A) ESOPHAGOGASTRODUODENOSCOPY (EGD) WITH PROPOFOL (N/A) POLYPECTOMY BIOPSY  Patient Location: PACU  Anesthesia Type:MAC  Level of Consciousness: Patient easily awoken, sedated, comfortable, cooperative, following commands, responds to stimulation.   Airway & Oxygen Therapy: Patient spontaneously breathing, ventilating well, oxygen via simple oxygen mask.  Post-op Assessment: Report given to PACU RN, vital signs reviewed and stable, moving all extremities.   Post vital signs: Reviewed and stable.  Complications: No apparent anesthesia complications Last Vitals:  Vitals Value Taken Time  BP 135/86 04/19/22 0951  Temp    Pulse 77 04/19/22 0955  Resp 21 04/19/22 0955  SpO2 100 % 04/19/22 0955  Vitals shown include unvalidated device data.  Last Pain:  Vitals:   04/19/22 0829  TempSrc: Temporal  PainSc: 5       Patients Stated Pain Goal: 3 (34/74/25 9563)  Complications: No notable events documented.

## 2022-04-19 NOTE — H&P (Signed)
GASTROENTEROLOGY PROCEDURE H&P NOTE   Primary Care Physician: Janith Lima, MD  HPI: Sheryl [name] is a 56 y.o. female who presents for EGD/EUS to evaluate gastric cancer for pre-treatment staging.  Past Medical History:  Diagnosis Date   Blood transfusion without reported diagnosis    had transfusion with hysterectomy   Cataract    Colon polyps 2012   Diabetes ([place]) 03/13/2021   Diabetes ([place]) 05/21/2019   Fibroid    GERD (gastroesophageal reflux disease)    H/O blood clots    History of hysterectomy    fibroids and heavy cycles   Hypertension    Past Surgical History:  Procedure Laterality Date   ABDOMINAL HYSTERECTOMY     COLONOSCOPY     UPPER GASTROINTESTINAL ENDOSCOPY     Current Facility-Administered Medications  Medication Dose Route Frequency Provider Last Rate Last Admin   0.9 %  sodium chloride infusion   Intravenous Continuous Mansouraty, Telford Nab., MD        Current Facility-Administered Medications:    0.9 %  sodium chloride infusion, , Intravenous, Continuous, Mansouraty, Telford Nab., MD Allergies  Allergen Reactions   Aspirin Anaphylaxis   Cyclobenzaprine Anaphylaxis   Naproxen Sodium Anaphylaxis   Zithromax [Azithromycin Dihydrate] Anaphylaxis   Dilaudid [Hydromorphone] Nausea And Vomiting   Family History  Problem Relation Age of Onset   Stroke Mother    Diabetes Mother    Hypertension Mother    Stroke Father    Heart attack Maternal Grandmother    Breast cancer Paternal Grandmother    Diabetes Other    Hypertension Other    Stroke Other    Cancer Other    Heart attack Other    Esophageal cancer Neg Hx    Liver disease Neg Hx    Stomach cancer Neg Hx    Colon cancer Neg Hx    Rectal cancer Neg Hx    Social History   Socioeconomic History   Marital status: Widowed    Spouse name: Not on file   Number of children: 2   Years of education: Not on file   Highest education level: Not on file  Occupational History    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, hospital  Tobacco Use   Smoking status: Never   Smokeless tobacco: Never  Vaping Use   Vaping Use: Never used  Substance and Sexual Activity   Alcohol use: No   Drug use: No   Sexual activity: Yes    Birth control/protection: Surgical    Comment: Hyst  Other Topics Concern   Not on file  Social History Narrative   Not on file   Social Determinants of Health   Financial Resource Strain: Not on file  Food Insecurity: Not on file  Transportation Needs: Not on file  Physical Activity: Not on file  Stress: Not on file  Social Connections: Not on file  Intimate Partner Violence: Not on file    Physical Exam: There were no vitals filed for this visit. There is no height or weight on file to calculate BMI. GEN: NAD EYE: Sclerae anicteric ENT: MMM CV: Non-tachycardic GI: Soft, 5-6/10 pain in MEG currently  NEURO:  Alert & Oriented x 3  Lab Results: No results for input(s): "WBC", "HGB", "HCT", "PLT" in the last 72 hours. BMET No results for input(s): "NA", "K", "CL", "CO2", "GLUCOSE", "BUN", "CREATININE", "CALCIUM" in the last 72 hours. LFT No results for input(s): "PROT", "ALBUMIN", "AST", "ALT", "ALKPHOS", "BILITOT", "BILIDIR", "IBILI" in the last 72 hours.  PT/INR No results for input(s): "LABPROT", "INR" in the last 72 hours.   Impression / Plan: This is a 56 y.o.female who presents for EGD/EUS to evaluate gastric cancer for pre-treatment staging.  The risks of an EUS including intestinal perforation, bleeding, infection, aspiration, and medication effects were discussed as was the possibility it may not give a definitive diagnosis if a biopsy is performed.  When a biopsy of the pancreas is done as part of the EUS, there is an additional risk of pancreatitis at the rate of about 1-2%.  It was explained that procedure related pancreatitis is typically mild, although it can be severe and even life threatening, which is why we do not perform random  pancreatic biopsies and only biopsy a lesion/area we feel is concerning enough to warrant the risk.  The risks and benefits of endoscopic evaluation/treatment were discussed with the patient and/or family; these include but are not limited to the risk of perforation, infection, bleeding, missed lesions, lack of diagnosis, severe illness requiring hospitalization, as well as anesthesia and sedation related illnesses.  The patient's history has been reviewed, patient examined, no change in status, and deemed stable for procedure.  The patient and/or family is agreeable to proceed.    Justice Britain, MD [facility] Office # [phone]

## 2022-04-19 NOTE — Discharge Instructions (Signed)
YOU HAD AN ENDOSCOPIC PROCEDURE TODAY: Refer to the procedure report and other information in the discharge instructions given to you for any specific questions about what was found during the examination. If this information does not answer your questions, please call Sun office at [phone] to clarify.  ° °YOU SHOULD EXPECT: Some feelings of bloating in the abdomen. Passage of more gas than usual. Walking can help get rid of the air that was put into your GI tract during the procedure and reduce the bloating. If you had a lower endoscopy (such as a colonoscopy or flexible sigmoidoscopy) you may notice spotting of blood in your stool or on the toilet paper. Some abdominal soreness may be present for a day or two, also. ° °DIET: Your first meal following the procedure should be a light meal and then it is ok to progress to your normal diet. A half-sandwich or bowl of soup is an example of a good first meal. Heavy or fried foods are harder to digest and may make you feel nauseous or bloated. Drink plenty of fluids but you should avoid alcoholic beverages for 24 hours. If you had a esophageal dilation, please see attached instructions for diet.   ° °ACTIVITY: Your care partner should take you home directly after the procedure. You should plan to take it easy, moving slowly for the rest of the day. You can resume normal activity the day after the procedure however YOU SHOULD NOT DRIVE, use power tools, machinery or perform tasks that involve climbing or major physical exertion for 24 hours (because of the sedation medicines used during the test).  ° °SYMPTOMS TO REPORT IMMEDIATELY: °A gastroenterologist can be reached at any hour. Please call [phone]  for any of the following symptoms:  °Following lower endoscopy (colonoscopy, flexible sigmoidoscopy) °Excessive amounts of blood in the stool  °Significant tenderness, worsening of abdominal pains  °Swelling of the abdomen that is new, acute  °Fever of 100° or  higher  °Following upper endoscopy (EGD, EUS, ERCP, esophageal dilation) °Vomiting of blood or coffee ground material  °New, significant abdominal pain  °New, significant chest pain or pain under the shoulder blades  °Painful or persistently difficult swallowing  °New shortness of breath  °Black, tarry-looking or red, bloody stools ° °FOLLOW UP:  °If any biopsies were taken you will be contacted by phone or by letter within the next 1-3 weeks. Call [phone]  if you have not heard about the biopsies in 3 weeks.  °Please also call with any specific questions about appointments or follow up tests. ° °

## 2022-04-19 NOTE — Anesthesia Postprocedure Evaluation (Signed)
Anesthesia Post Note  Patient: Shaquasia Caponigro Tappen  Procedure(s) Performed: UPPER ENDOSCOPIC ULTRASOUND (EUS) RADIAL ESOPHAGOGASTRODUODENOSCOPY (EGD) WITH PROPOFOL POLYPECTOMY BIOPSY     Patient location during evaluation: Endoscopy Anesthesia Type: MAC Level of consciousness: awake and alert Pain management: pain level controlled Vital Signs Assessment: post-procedure vital signs reviewed and stable Respiratory status: spontaneous breathing, nonlabored ventilation and respiratory function stable Cardiovascular status: stable and blood pressure returned to baseline Postop Assessment: no apparent nausea or vomiting Anesthetic complications: no  No notable events documented.  Last Vitals:  Vitals:   04/19/22 1045 04/19/22 1050  BP: 128/88 126/85  Pulse: 63 61  Resp: 12 13  Temp:    SpO2: 97% 97%    Last Pain:  Vitals:   04/19/22 1050  TempSrc:   PainSc: 0-No pain                 Ger Nicks

## 2022-04-19 NOTE — Op Note (Signed)
[facility] Patient Name: Sheryl [name] Procedure Date: 04/19/2022 MRN: [id number] Attending MD: Justice Britain , MD, [id number] Date of Birth: May 05, 1965 CSN: [id number] Age: 56 Admit Type: Outpatient Procedure:                Upper EUS Indications:              Abnormal abdominal/pelvic CT scan, Pre-treatment                            staging of gastric adenocarcinoma Providers:                Justice Britain, MD, Carlyn Reichert, RN, Fanny Skates RN, RN, Gloris Ham, Technician,                            Heide Scales, CRNA Referring MD:             Justice Britain, MD Medicines:                Monitored Anesthesia Care Complications:            No immediate complications. Estimated Blood Loss:     Estimated blood loss was minimal. Procedure:                Pre-Anesthesia Assessment:                           - Prior to the procedure, a History and Physical                            was performed, and patient medications and                            allergies were reviewed. The patient's tolerance of                            previous anesthesia was also reviewed. The risks                            and benefits of the procedure and the sedation                            options and risks were discussed with the patient.                            All questions were answered, and informed consent                            was obtained. Prior Anticoagulants: The patient has                            taken no anticoagulant or antiplatelet agents. ASA  Grade Assessment: III - A patient with severe                            systemic disease. After reviewing the risks and                            benefits, the patient was deemed in satisfactory                            condition to undergo the procedure.                           After obtaining informed consent, the endoscope was                             passed under direct vision. Throughout the                            procedure, the patient's blood pressure, pulse, and                            oxygen saturations were monitored continuously. The                            [id number]-H190 ([id number]) Olympus endoscope was introduced                            through the mouth, and advanced to the second part                            of duodenum. The GF-[id number]-[id number] ([id number]) Olympus                            radial ultrasound scope was introduced through the                            mouth, and advanced to the duodenum for ultrasound                            examination from the esophagus, stomach and                            duodenum. The upper EUS was accomplished without                            difficulty. The patient tolerated the procedure. Scope In: Scope Out: Findings:      ENDOSCOPIC FINDING: :      One benign-appearing, intrinsic mild (non-circumferential scarring)       stenosis was found 35 cm from the incisors. This stenosis measured 1.6       cm (inner diameter) x less than one cm (in length). The stenosis was       traversed.      No gross lesions were noted in the entire esophagus.  The Z-line was regular and was found 40 cm from the incisors.      A 1 cm hiatal hernia was present.      Diffuse severe mucosal changes characterized by congestion, erythema,       friability (with contact bleeding), hemorrhagic appearance,       inflammation, nodularity and altered texture were found in the entire       examined stomach. 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 To show the more diffuse process,       I decided to biopsy each of the main areas because of concern. Biopsies       were taken with a cold forceps for histology from the cardia/fundus (all       the way to the very beginning of the stomach just below the GE Jxn.       Biopsies were  taken with a cold forceps for histology from the greater       curve/lesser curve. Biopsies were taken with a cold forceps for       histology from the incisura/antrum/prepylorus.      A few small semi-sessile polyps with no bleeding and no stigmata of       recent bleeding were found in the gastric body.      One non-bleeding linear gastric ulcer with heaped up subepithelial       thickening/nodularity noted with a clean ulcer base (Forrest Class III)       was found in the gastric antrum/prepyloric region of the stomach. The       lesion was 20 mm in largest dimension.      The ulcer led to deformity within the antrum/prepyloric region/pylorus.      Three 3 to 6 mm sessile polyps were found in the duodenal bulb. The       polyp was removed with a cold snare. Resection and retrieval were       complete.      No other gross lesions were noted in the duodenal bulb, in the first       portion of the duodenum and in the second portion of the duodenum.      ENDOSONOGRAPHIC FINDING: :      Diffuse wall thickening was visualized endosonographically in the       cardia/fundus of the stomach (10.6 mm), in the body of the stomach (10.0       mm -> 12.0 mm), in the antrum/incisura of the stomach (12.1 mm -> 28       mm). This appeared to primarily be due to thickening within the deep       mucosa (Layer 2), submucosa (Layer 3) and muscularis propria (Layer 4).       I did not visualize any issue in regards to the diffuse thickening       having broken through the muscularis propria however on careful       inspection.      A rounded intramural (subepithelial) lesion was found in the antrum of       the stomach. The lesion was hypoechoic and calcified. Sonographically,       the lesion appeared to originate from the deep mucosa (Layer 2) and       submucosa (Layer 3). The lesion measured 17 mm (in maximum thickness).       The lesion also measured 18 mm in diameter. This was found  within the        very thickened area of the gastric antrum noted above. The outer       endosonographic borders were irregular.      Two enlarged lymph nodes were visualized in the celiac region (level       20). The largest measured 8 mm by 9 mm in maximal cross-sectional       diameter. The nodes were irregular, hypoechoic and had well defined       margins. To sample these would have led to passage of a needle through       diffusely concerning tissue (previous imaging had shown stability of       things).      Endosonographic imaging in the visualized portion of the liver showed no       mass.      Endosonographic imaging in the gastroesophageal junction showed no       extrinsic compression, intramural (subepithelial) lesion, mass,       stricture, varices or wall thickening. Impression:               EGD Impression:                           - Benign-appearing esophageal stenosis in distal                            esophagus.                           - No gross lesions in the entire esophagus.                           - Z-line regular, 40 cm from the incisors.                           - 1 cm hiatal hernia.                           - Congested, erythematous, friable (with contact                            bleeding), hemorrhagic appearing, inflamed, nodular                            and texture changed mucosa in the stomach. Biopsied.                           - A few gastric polyps (previously sampled and                            consistent with adenocarcinoma).                           - A large non-bleeding gastric ulcer with a clean                            ulcer base (Forrest Class III) and heaped up  supepithelial layer noted. Acquired deformity due                            to the aforementioned ulcer.                           - Three duodenal polyps. Resected and retrieved.                           - No other gross lesions in the duodenal bulb, in                             the first portion of the duodenum and in the second                            portion of the duodenum.                           EUS Impression:                           - Wall thickening was seen in the cardia of the                            stomach, in the fundus of the stomach, in the body                            of the stomach, in the incisura of the stomach, in                            the antrum of the stomach and in the prepyloric                            region of the stomach. The thickening appeared to                            be primarily within the deep mucosa (Layer 2),                            submucosa (Layer 3) and muscularis propria (Layer                            4). The thickening increases substantially as you                            go more distal into the antrum where the wall is                            nealry 3 cm in size, with what also appears in that                            region, where the ulcer bed was of an intramural                            (  subepithelial) lesion that originates from within                            the deep mucosa (Layer 2) and submucosa (Layer 3).                            I do not see any evidence of the muscularis propria                            however having been disrupted on today's EUS. Thus                            staging would be [id number]. I cannot comment on the                            LNs that imaging have shown stability on for                            >10-years as I did not sample due to concern of                            traversing the diffuse gastric cancer.                           - Two enlarged lymph nodes were visualized in the                            celiac region (level 20). Not sampled due to                            concern of traversing diffuse signet ring cancer. Moderate Sedation:      Not Applicable - Patient had care per  Anesthesia. Recommendation:           - The patient will be observed post-procedure,                            until all discharge criteria are met.                           - Discharge patient to home.                           - Patient has a contact number available for                            emergencies. The signs and symptoms of potential                            delayed complications were discussed with the                            patient. Return to normal activities tomorrow.  Written discharge instructions were provided to the                            patient.                           - Resume previous diet.                           - Observe patient's clinical course.                           - Follow up with Oncology and Surgical Oncology to                            discuss next steps in evaluation.                           - Continue current medicaitons.                           - Initiate Carafate twice daily.                           - The findings and recommendations were discussed                            with the patient.                           - The findings and recommendations were discussed                            with the patient's family. Procedure Code(s):        --- Professional ---                           [phone], Esophagogastroduodenoscopy, flexible,                            transoral; with endoscopic ultrasound examination,                            including the esophagus, stomach, and either the                            duodenum or a surgically altered stomach where the                            jejunum is examined distal to the anastomosis                           43251, Esophagogastroduodenoscopy, flexible,                            transoral; with removal of tumor(s), polyp(s), or  other lesion(s) by snare technique                           43239, 59,  Esophagogastroduodenoscopy, flexible,                            transoral; with biopsy, single or multiple Diagnosis Code(s):        --- Professional ---                           K22.2, Esophageal obstruction                           K44.9, Diaphragmatic hernia without obstruction or                            gangrene                           K92.2, Gastrointestinal hemorrhage, unspecified                           K29.70, Gastritis, unspecified, without bleeding                           K31.89, Other diseases of stomach and duodenum                           K31.7, Polyp of stomach and duodenum                           K25.9, Gastric ulcer, unspecified as acute or                            chronic, without hemorrhage or perforation                           R59.0, Localized enlarged lymph nodes                           C16.9, Malignant neoplasm of stomach, unspecified                           R93.5, Abnormal findings on diagnostic imaging of                            other abdominal regions, including retroperitoneum CPT copyright 2022 [employer]. All rights reserved. The codes documented in this report are preliminary and upon coder review may  be revised to meet current compliance requirements. Justice Britain, MD 04/19/2022 10:14:22 AM Number of Addenda: 0

## 2022-04-19 NOTE — Anesthesia Preprocedure Evaluation (Signed)
Anesthesia Evaluation  Patient identified by MRN, date of birth, ID band Patient awake    Reviewed: Allergy & Precautions, H&P , NPO status , Patient's Chart, lab work & pertinent test results  History of Anesthesia Complications Negative for: history of anesthetic complications  Airway Mallampati: I  TM Distance: >3 FB Neck ROM: Full    Dental  (+) Dental Advisory Given, Teeth Intact   Pulmonary neg pulmonary ROS, neg shortness of breath, neg COPD, neg recent URI   breath sounds clear to auscultation       Cardiovascular hypertension, Pt. on medications (-) angina (-) CHF negative cardio ROS  Rhythm:Regular     Neuro/Psych negative neurological ROS  negative psych ROS   GI/Hepatic Neg liver ROS,GERD  Medicated,,Gatsric ca    Endo/Other  negative endocrine ROSdiabetes    Renal/GU negative Renal ROS     Musculoskeletal negative musculoskeletal ROS (+)    Abdominal   Peds  Hematology negative hematology ROS (+)   Anesthesia Other Findings   Reproductive/Obstetrics                             Anesthesia Physical Anesthesia Plan  ASA: 2  Anesthesia Plan: MAC   Post-op Pain Management: Minimal or no pain anticipated   Induction: Intravenous  PONV Risk Score and Plan: 2 and Propofol infusion and Treatment may vary due to age or medical condition  Airway Management Planned: Nasal Cannula and Natural Airway  Additional Equipment: None  Intra-op Plan:   Post-operative Plan:   Informed Consent: I have reviewed the patients History and Physical, chart, labs and discussed the procedure including the risks, benefits and alternatives for the proposed anesthesia with the patient or authorized representative who has indicated his/her understanding and acceptance.     Dental advisory given  Plan Discussed with: CRNA  Anesthesia Plan Comments:        Anesthesia Quick  Evaluation

## 2022-04-20 ENCOUNTER — Ambulatory Visit: Payer: Self-pay | Admitting: Surgery

## 2022-04-20 ENCOUNTER — Inpatient Hospital Stay (HOSPITAL_BASED_OUTPATIENT_CLINIC_OR_DEPARTMENT_OTHER): Payer: Commercial Managed Care - HMO | Admitting: Hematology

## 2022-04-20 DIAGNOSIS — C162 Malignant neoplasm of body of stomach: Secondary | ICD-10-CM | POA: Diagnosis not present

## 2022-04-20 NOTE — Progress Notes (Signed)
I spoke with Ms Oelke she agrees to a telephone visit with Dr Burr Medico today at 1615, appt made.

## 2022-04-20 NOTE — Progress Notes (Unsigned)
[facility]   [phone] [phone]   Clinic Follow up Note   Patient Care Team: Janith Lima, MD as PCP - General (Internal Medicine)  Date of Service:  04/20/2022  I connected with Sheryl [name] on 04/20/2022 at  4:15 PM EST by telephone visit and verified that I am speaking with the correct person using two identifiers.  I discussed the limitations, risks, security and privacy concerns of performing an evaluation and management service by telephone and the availability of in person appointments. I also discussed with the patient that there may be a patient responsible charge related to this service. The patient expressed understanding and agreed to proceed.   Other persons participating in the visit and their role in the encounter:  Pt Children, Dad  Patient's location:  Home Provider's location:  Office  CHIEF COMPLAINT: f/u of Gastric Cancer   CURRENT THERAPY:    ASSESSMENT & PLAN:  Sheryl [name] is a 56 y.o. 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 -She presented with intermittent abdominal pain nausea and 40 pound weight loss since early 2023. -I reviewed her US findings, which showed T2 lesion by ultrasound, however it involves probably the entire stomach.  Mastronardi have done multiple additional biopsies, results are still pending. -Discussed her case with her surgeon Dr. Zenia Resides, given the diffuse involvement of her cancer and stomach, we recommended neoadjuvant chemotherapy. -I discussed the high risk of recurrence with surgery alone, due to the diffuse involvement of stomach and aggressive histology.  I discussed the role of neoadjuvant versus adjuvant chemotherapy.  After lengthy discussion, patient and her family agrees to start chemo first.  -I recommend FLOT (docetaxel, oxaliplatin, leucovorin and 5-FU), 2 weeks for 3 to 4 months, reevaluate  response by CT and EGD in 2 to 3 months. --Chemotherapy consent: Side effects including but does not not limited to, fatigue, nausea, vomiting, diarrhea, hair loss, cold sensitivity and peripheral neuropathy, fluid retention, renal and kidney dysfunction, neutropenic fever, needed for blood transfusion, bleeding, were discussed with patient in great detail. She agrees to proceed. -The goal of therapy is curative. -plan to start chemo in 1-2 weeks    PLAN: -Discuss EUS findings which showing Diffuse involvement in the stomach. -I recommend neoadjuvant chemo first to shrink the tumor before surgery. -Discuss Port Placement by Dr. Zenia Resides, and chemo regimen FLOT with GCSF  - Chemo Class - Start Treatment in about 1-2 weeks    SUMMARY OF ONCOLOGIC HISTORY: Oncology History  Gastric cancer ([place])  03/30/2022 Procedure   EGD:  Impression:  - Normal esophagus. - A few gastric polyps. Biopsied. - Gastritis. Biopsied. - Non-bleeding gastric ulcer with no stigmata of bleeding. Biopsied. - Normal examined duodenum. Biopsied.  Findings: Diffuse moderate inflammation characterized by congestion (edema), friability and granularity was found in the cardia, in the gastric fundus and in the gastric body. There were associated erosions in multiple places. Biopsies were taken from the antrum, body, and fundus with a cold forceps for histology. Estimated blood loss was minimal.  One non-bleeding cratered gastric ulcer with no stigmata of bleeding was found on the greater curvature of the stomach. The lesion was 6 mm in largest dimension. The mucosa around the ulcer was heaped and led to some deformity in the antrum. Biopsies were taken with a cold forceps for histology. Estimated blood loss was minimal.    03/30/2022 Pathology Results   Patient:  Sheryl, [name]  Accession: [id number]  Diagnosis 1. Surgical [P], duodenal - BENIGN SMALL BOWEL MUCOSA WITH NO SIGNIFICANT PATHOLOGIC CHANGES 2.  Surgical [P], gastric antrum - GASTRIC ANTRAL MUCOSA WITH FEATURES OF REACTIVE GASTROPATHY - NEGATIVE FOR H. PYLORI ON H&E STAIN - NEGATIVE FOR INTESTINAL METAPLASIA OR MALIGNANCY 3. Surgical [P], gastric body - GASTRIC OXYNTIC MUCOSA WITH REACTIVE/REPARATIVE CHANGES - NEGATIVE FOR H. PYLORI ON H&E STAIN - NEGATIVE FOR INTESTINAL METAPLASIA, DYSPLASIA OR MALIGNANCY 4. Surgical [P], greater curve ulceration - ADENOCARCINOMA WITH SIGNET RING CELL FEATURES (SEE NOTE) 5. Surgical [P], gastric polyps - ADENOCARCINOMA WITH SIGNET RING CELL FEATURES (SEE NOTE) 6. Surgical [P], fundus (gastric) - ADENOCARCINOMA WITH SIGNET RING CELL FEATURES (SEE NOTE) 7. Surgical [P], colon, ascending, polyp (1) - TUBULAR ADENOMA. - NO HIGH GRADE DYSPLASIA OR MALIGNANCY. 8. Surgical [P], colon, transverse, polyp (1) - TUBULAR ADENOMA. - NO HIGH GRADE DYSPLASIA OR MALIGNANCY.    04/13/2022 Initial Diagnosis   Gastric cancer ([place])   04/19/2022 Cancer Staging   Staging form: Stomach, AJCC 8th Edition - Clinical stage from 04/19/2022: Stage I (cT2, cN0, cM0) - Signed by Truitt Merle, MD on 04/20/2022 Total positive nodes: 0      INTERVAL HISTORY:  Sheryl [name] was contacted for a follow up of Gastric Cancer . She was last seen by PA-C Cassie  on .04/13/2022   All other systems were reviewed with the patient and are negative.  MEDICAL HISTORY:  Past Medical History:  Diagnosis Date   Blood transfusion without reported diagnosis    had transfusion with hysterectomy   Cataract    Colon polyps 2012   Diabetes ([place]) 03/13/2021   Diabetes ([place]) 05/21/2019   Fibroid    GERD (gastroesophageal reflux disease)    H/O blood clots    History of hysterectomy    fibroids and heavy cycles   Hypertension     SURGICAL HISTORY: Past Surgical History:  Procedure Laterality Date   ABDOMINAL HYSTERECTOMY     COLONOSCOPY     UPPER GASTROINTESTINAL ENDOSCOPY      I have reviewed the social history and  family history with the patient and they are unchanged from previous note.  ALLERGIES:  is allergic to aspirin, cyclobenzaprine, naproxen sodium, zithromax [azithromycin dihydrate], and dilaudid [hydromorphone].  MEDICATIONS:  Current Outpatient Medications  Medication Sig Dispense Refill   acetaminophen (TYLENOL) 500 MG tablet Take 1,000 mg by mouth every 8 (eight) hours as needed (pain).     amLODipine (NORVASC) 10 MG tablet TAKE 1 TABLET (10 MG TOTAL) BY MOUTH DAILY. 90 tablet 1   Bacillus Coagulans-Inulin (PROBIOTIC-PREBIOTIC) 1-250 BILLION-MG CAPS Take 2 capsules by mouth daily.     Biotin 1000 MCG CHEW Chew 1,000 mcg by mouth daily.     Blood Glucose Monitoring Suppl (TRUE METRIX METER) w/Device KIT 1 kit by Does not apply route 3 (three) times daily as needed. 1 kit 0   Cholecalciferol (VITAMIN D3) 125 MCG (5000 UT) TABS Take 5,000 Units by mouth daily.     ondansetron (ZOFRAN-ODT) 4 MG disintegrating tablet Take 1 tablet (4 mg total) by mouth every 6 (six) hours. Dissolve one tab on tongue 30 minutes before each dose of bowel prep (Patient not taking: Reported on 04/18/2022) 2 tablet 0   pantoprazole (PROTONIX) 40 MG tablet Take 1 tablet (40 mg total) by mouth 2 (two) times daily. 180 tablet 0   rosuvastatin (CRESTOR) 5 MG tablet Take 1 tablet (5 mg total) by mouth daily. (Patient not  taking: Reported on 04/18/2022) 90 tablet 1   sucralfate (CARAFATE) 1 g tablet Take 1 tablet (1 g total) by mouth 2 (two) times daily. 60 tablet 6   Current Facility-Administered Medications  Medication Dose Route Frequency Provider Last Rate Last Admin   0.9 %  sodium chloride infusion  500 mL Intravenous Continuous [place], MD        PHYSICAL EXAMINATION: ECOG PERFORMANCE STATUS: 1 - Symptomatic but completely ambulatory  There were no vitals filed for this visit. Wt Readings from Last 3 Encounters:  04/13/22 180 lb (81.6 kg)  03/30/22 192 lb (87.1 kg)  02/26/22 192 lb (87.1 kg)      No vitals taken today, Exam not performed today  LABORATORY DATA:  I have reviewed the data as listed    Latest Ref Rng & Units 04/13/2022   12:11 PM 12/05/2021    6:37 PM 12/20/2020    9:57 AM  CBC  WBC 4.0 - 10.5 K/uL 4.0  5.2  4.1   Hemoglobin 12.0 - 15.0 g/dL 14.5  13.7  14.4   Hematocrit 36.0 - 46.0 % 42.0  38.2  40.7   Platelets 150 - 400 K/uL 288  313  313         Latest Ref Rng & Units 04/13/2022   12:11 PM 01/23/2022    4:33 PM 12/05/2021    6:37 PM  CMP  Glucose 70 - 99 mg/dL 96  98  128   BUN 6 - 20 mg/dL _0 Creatinine 0.44 - 1.00 mg/dL 0.76  0.88  0.79   Sodium 135 - 145 mmol/L 140  135  134   Potassium 3.5 - 5.1 mmol/L 3.8  3.8  3.3   Chloride 98 - 111 mmol/L 104  99  99   CO2 22 - 32 mmol/L 32  28  26   Calcium 8.9 - 10.3 mg/dL 10.4  9.9  9.2   Total Protein 6.5 - 8.1 g/dL 7.3  7.7  7.5   Total Bilirubin 0.3 - 1.2 mg/dL 0.8  0.6  1.0   Alkaline Phos 38 - 126 U/L 110  73  80   AST 15 - 41 U/L 25  29  50   ALT 0 - 44 U/L 24  29  49       RADIOGRAPHIC STUDIES: I have personally reviewed the radiological images as listed and agreed with the findings in the report. No results found.    No orders of the defined types were placed in this encounter.  All questions were answered. The patient knows to call the clinic with any problems, questions or concerns. No barriers to learning was detected. The total time spent in the appointment was 40 minutes.     Truitt Merle, MD 04/20/2022   Felicity Coyer am acting as scribe for Truitt Merle, MD.   I have reviewed the above documentation for accuracy and completeness, and I agree with the above.

## 2022-04-21 ENCOUNTER — Encounter: Payer: Self-pay | Admitting: Hematology

## 2022-04-21 NOTE — Assessment & Plan Note (Signed)
-  cT2N0M0 with diffuse involvement of probably stomach, with signet ring features. -She presented with intermittent abdominal pain nausea and 40 pound weight loss since early 2023. -I reviewed her US findings, which showed T2 lesion by ultrasound, however it involves probably the entire stomach.  Mastronardi have done multiple additional biopsies, results are still pending. -Discussed her case with her surgeon Dr. Zenia Resides, given the diffuse involvement of her cancer and stomach, we recommended neoadjuvant chemotherapy. -I discussed the high risk of recurrence with surgery alone, due to the diffuse involvement of stomach and aggressive histology.  I discussed the role of neoadjuvant versus adjuvant chemotherapy.  After lengthy discussion, patient and her family agrees to start chemo first.  -I recommend FLOT (docetaxel, oxaliplatin, leucovorin and 5-FU), 2 weeks for 3 to 4 months, reevaluate response by CT and EGD in 2 to 3 months. --Chemotherapy consent: Side effects including but does not not limited to, fatigue, nausea, vomiting, diarrhea, hair loss, cold sensitivity and peripheral neuropathy, fluid retention, renal and kidney dysfunction, neutropenic fever, needed for blood transfusion, bleeding, were discussed with patient in great detail. She agrees to proceed. -The goal of therapy is curative. -plan to start chemo in 1-2 weeks

## 2022-04-21 NOTE — Progress Notes (Signed)
START ON PATHWAY REGIMEN - Gastroesophageal     A cycle is every 14 days:     Docetaxel      Oxaliplatin      Leucovorin      Fluorouracil   **Always confirm dose/schedule in your pharmacy ordering system**  Patient Characteristics: Gastric, Adenocarcinoma, Preoperative or Nonsurgical Candidate, M0 (Clinical Staging), cT3 or Higher or cN+, Surgical Candidate (Up to cT4a), MSS/pMMR or MSI Unknown Therapeutic Status: Preoperative or Nonsurgical Candidate, M0 (Clinical Staging) Histology: Adenocarcinoma Disease Classification: Gastric AJCC N Category: cN0 AJCC M Category: cM0 AJCC 8 Stage Grouping: IIB AJCC T Category: cT3 Microsatellite/Mismatch Repair Status: MSS/pMMR Intent of Therapy: Curative Intent, Discussed with Patient

## 2022-04-23 ENCOUNTER — Encounter (HOSPITAL_COMMUNITY): Payer: Self-pay | Admitting: Gastroenterology

## 2022-04-23 ENCOUNTER — Other Ambulatory Visit: Payer: Self-pay

## 2022-04-24 LAB — SURGICAL PATHOLOGY

## 2022-04-25 ENCOUNTER — Other Ambulatory Visit: Payer: Self-pay | Admitting: Genetic Counselor

## 2022-04-25 ENCOUNTER — Telehealth: Payer: Self-pay | Admitting: Hematology

## 2022-04-25 DIAGNOSIS — C162 Malignant neoplasm of body of stomach: Secondary | ICD-10-CM

## 2022-04-25 NOTE — Telephone Encounter (Signed)
Patient aware of chemo education class, was going to schedule patient 1st tx on 05/03/22 patient says she's getting port placed 1/4. Will schedule 1st treatment after port placed

## 2022-04-26 ENCOUNTER — Inpatient Hospital Stay: Payer: Commercial Managed Care - HMO | Admitting: Dietician

## 2022-04-26 ENCOUNTER — Inpatient Hospital Stay: Payer: Commercial Managed Care - HMO

## 2022-04-26 ENCOUNTER — Inpatient Hospital Stay (HOSPITAL_BASED_OUTPATIENT_CLINIC_OR_DEPARTMENT_OTHER): Payer: Commercial Managed Care - HMO | Admitting: Genetic Counselor

## 2022-04-26 ENCOUNTER — Telehealth: Payer: Self-pay | Admitting: Dietician

## 2022-04-26 DIAGNOSIS — Z803 Family history of malignant neoplasm of breast: Secondary | ICD-10-CM | POA: Diagnosis not present

## 2022-04-26 DIAGNOSIS — C162 Malignant neoplasm of body of stomach: Secondary | ICD-10-CM

## 2022-04-26 DIAGNOSIS — Z800 Family history of malignant neoplasm of digestive organs: Secondary | ICD-10-CM | POA: Diagnosis not present

## 2022-04-26 LAB — GENETIC SCREENING ORDER

## 2022-04-26 NOTE — Telephone Encounter (Signed)
R/s per 12/28 sch error, tried to call but VM not set up

## 2022-04-27 ENCOUNTER — Ambulatory Visit
Admission: RE | Admit: 2022-04-27 | Discharge: 2022-04-27 | Disposition: A | Discharge status: Home / Self Care | Payer: Commercial Managed Care - HMO | Source: Ambulatory Visit | Attending: Internal Medicine | Admitting: Internal Medicine

## 2022-04-27 ENCOUNTER — Encounter: Payer: Self-pay | Admitting: Gastroenterology

## 2022-04-27 ENCOUNTER — Other Ambulatory Visit: Payer: Self-pay

## 2022-04-27 ENCOUNTER — Encounter: Payer: Self-pay | Admitting: Hematology

## 2022-04-27 ENCOUNTER — Encounter: Payer: Self-pay | Admitting: Genetic Counselor

## 2022-04-27 DIAGNOSIS — Z800 Family history of malignant neoplasm of digestive organs: Secondary | ICD-10-CM | POA: Insufficient documentation

## 2022-04-27 DIAGNOSIS — Z803 Family history of malignant neoplasm of breast: Secondary | ICD-10-CM | POA: Insufficient documentation

## 2022-04-27 DIAGNOSIS — Z1231 Encounter for screening mammogram for malignant neoplasm of breast: Secondary | ICD-10-CM

## 2022-04-27 NOTE — Progress Notes (Signed)
REFERRING PROVIDER: Truitt Merle, MD [address] [place],   [address]  PRIMARY PROVIDER:  Janith Lima, MD  PRIMARY REASON FOR VISIT:  1. Family history of stomach cancer   2. Family history of pancreatic cancer   3. Family history of breast cancer   4. Malignant neoplasm of body of stomach (Sheryl [name])      HISTORY OF PRESENT ILLNESS:   Sheryl [name], a 56 y.o. female, was seen for a [facility] cancer genetics consultation at the request of Sheryl [name] due to a personal and family history of cancer.  Sheryl [name] presents to clinic today to discuss the possibility of a hereditary predisposition to cancer, genetic testing, and to further clarify her future cancer risks, as well as potential cancer risks for family members.   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 The treatment plan surgery and chemotherapy.     CANCER HISTORY:  Oncology History  Gastric cancer ([place])  03/30/2022 Procedure   EGD:  Impression:  - Normal esophagus. - A few gastric polyps. Biopsied. - Gastritis. Biopsied. - Non-bleeding gastric ulcer with no stigmata of bleeding. Biopsied. - Normal examined duodenum. Biopsied.  Findings: Diffuse moderate inflammation characterized by congestion (edema), friability and granularity was found in the cardia, in the gastric fundus and in the gastric body. There were associated erosions in multiple places. Biopsies were taken from the antrum, body, and fundus with a cold forceps for histology. Estimated blood loss was minimal.  One non-bleeding cratered gastric ulcer with no stigmata of bleeding was found on the greater curvature of the stomach. The lesion was 6 mm in largest dimension. The mucosa around the ulcer was heaped and led to some deformity in the antrum. Biopsies were taken with a cold forceps for histology. Estimated blood loss was minimal.    03/30/2022 Pathology Results   [name]  Accession: [id number]  Diagnosis 1. Surgical [P], duodenal - BENIGN SMALL BOWEL MUCOSA WITH NO SIGNIFICANT PATHOLOGIC CHANGES 2. Surgical [P], gastric antrum - GASTRIC ANTRAL MUCOSA WITH FEATURES OF REACTIVE GASTROPATHY - NEGATIVE FOR H. PYLORI ON H&E STAIN - NEGATIVE FOR INTESTINAL METAPLASIA OR MALIGNANCY 3. Surgical [P], gastric body - GASTRIC OXYNTIC MUCOSA WITH REACTIVE/REPARATIVE CHANGES - NEGATIVE FOR H. PYLORI ON H&E STAIN - NEGATIVE FOR INTESTINAL METAPLASIA, DYSPLASIA OR MALIGNANCY 4. Surgical [P], greater curve ulceration - ADENOCARCINOMA WITH SIGNET RING CELL FEATURES (SEE NOTE) 5. Surgical [P], gastric polyps - ADENOCARCINOMA WITH SIGNET RING CELL FEATURES (SEE NOTE) 6. Surgical [P], fundus (gastric) - ADENOCARCINOMA WITH SIGNET RING CELL FEATURES (SEE NOTE) 7. Surgical [P], colon, ascending, polyp (1) - TUBULAR ADENOMA. - NO HIGH GRADE DYSPLASIA OR MALIGNANCY. 8. Surgical [P], colon, transverse, polyp (1) - TUBULAR ADENOMA. - NO HIGH GRADE DYSPLASIA OR MALIGNANCY.    04/13/2022 Initial Diagnosis   Gastric cancer (Sheryl [name])   04/19/2022 Cancer Staging   Staging form: Stomach, AJCC 8th Edition - Clinical stage from 04/19/2022: Stage I (cT2, cN0, cM0) - Signed by Sheryl Merle, MD on 04/20/2022 Total positive nodes: 0   05/02/2022 -  Chemotherapy   Patient is on Treatment Plan : GASTROESOPHAGEAL FLOT q14d X 4 cycles        Past Medical History:  Diagnosis Date   Blood transfusion without reported diagnosis    had transfusion with hysterectomy   Cataract    Colon polyps 2012   Diabetes ([place]) 03/13/2021   Diabetes ([place]) 05/21/2019  Family history of breast cancer    Family history of pancreatic cancer    Family history of stomach cancer    Fibroid    GERD (gastroesophageal reflux disease)    H/O blood clots    History of hysterectomy    fibroids and heavy cycles   Hypertension     Past Surgical History:  Procedure Laterality Date   ABDOMINAL  HYSTERECTOMY     BIOPSY  04/19/2022   Procedure: BIOPSY;  Surgeon: Sheryl [name]., MD;  Location: [facility];  Service: Gastroenterology;;   COLONOSCOPY     ESOPHAGOGASTRODUODENOSCOPY (EGD) WITH PROPOFOL N/A 04/19/2022   Procedure: ESOPHAGOGASTRODUODENOSCOPY (EGD) WITH PROPOFOL;  Surgeon: Sheryl [name]., MD;  Location: [facility];  Service: Gastroenterology;  Laterality: N/A;   EUS N/A 04/19/2022   Procedure: UPPER ENDOSCOPIC ULTRASOUND (EUS) RADIAL;  Surgeon: Sheryl [name]., MD;  Location: [facility];  Service: Gastroenterology;  Laterality: N/A;   POLYPECTOMY  04/19/2022   Procedure: POLYPECTOMY;  Surgeon: Rush Landmark Telford Nab., MD;  Location: [facility];  Service: Gastroenterology;;   UPPER GASTROINTESTINAL ENDOSCOPY      Social History   Socioeconomic History   Marital status: Widowed    Spouse name: Not on file   Number of children: 2   Years of education: Not on file   Highest education level: Not on file  Occupational History   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, hospital  Tobacco Use   Smoking status: Never   Smokeless tobacco: Never  Vaping Use   Vaping Use: Never used  Substance and Sexual Activity   Alcohol use: No   Drug use: No   Sexual activity: Yes    Birth control/protection: Surgical    Comment: Hyst  Other Topics Concern   Not on file  Social History Narrative   Not on file   Social Determinants of Health   Financial Resource Strain: Not on file  Food Insecurity: Not on file  Transportation Needs: Not on file  Physical Activity: Not on file  Stress: Not on file  Social Connections: Not on file     FAMILY HISTORY:  We obtained a detailed, 4-generation family history.  Significant diagnoses are listed below: Family History  Problem Relation Age of Onset   Stroke Mother    Diabetes Mother    Hypertension Mother    Multiple myeloma Mother    Stroke Father    Pancreatic cancer Maternal Aunt    Stomach cancer Maternal Uncle     Breast cancer Paternal Aunt    Stomach cancer Paternal Aunt    Stomach cancer Paternal Uncle    Heart attack Maternal Grandmother    Breast cancer Paternal Grandmother    Diabetes Other    Hypertension Other    Stroke Other    Cancer Other    Heart attack Other    Esophageal cancer Neg Hx    Liver disease Neg Hx    Colon cancer Neg Hx    Rectal cancer Neg Hx      The patient has a son and daughter who are cancer free.  She has a full sister who is cancer free.  There are two paternal half brothers and two maternal half brothers and a sister who are all cancer free.  Both parents are living.  The patient's mother has multiple myeloma.  She has two brothers who had stomach cancer a sister who had pancreatic cancer and six other sisters who did not have cancer.  The maternal grandparents are deceased from  non-cancer related issues.  The patient's father is cancer free.  He has a brother and a sister who had stomach cancer, a sister who had breast cancer and six other siblings who were cancer free. His mother had breast cancer.  Sheryl [name] is unaware of previous family history of genetic testing for hereditary cancer risks. Patient's maternal ancestors are of [place] and [place] descent, and paternal ancestors are of African American descent. There is no reported Ashkenazi Jewish ancestry. There is no known consanguinity.  GENETIC COUNSELING ASSESSMENT: Sheryl [name] is a 56 y.o. female with a personal and family history of cancer which is somewhat suggestive of a hereditary cancer syndrome and predisposition to cancer given the combination of cancer in the family. We, therefore, discussed and recommended the following at today's visit.   DISCUSSION: We discussed that, in general, most cancer is not inherited in families, but instead is sporadic or familial. Sporadic cancers occur by chance and typically happen at older ages (>50 years) as this type of cancer is caused by  genetic changes acquired during an individual's lifetime. Some families have more cancers than would be expected by chance; however, the ages or types of cancer are not consistent with a known genetic mutation or known genetic mutations have been ruled out. This type of familial cancer is thought to be due to a combination of multiple genetic, environmental, hormonal, and lifestyle factors. While this combination of factors likely increases the risk of cancer, the exact source of this risk is not currently identifiable or testable.  We discussed that 3 - 5% of stomach cancer is hereditary, with most cases associated with Lynch syndrome.  There are other genes that can be associated with hereditary stomach cancer syndromes.  These include CDH1 and CTNNA1, as well as others.  We discussed that testing is beneficial for several reasons including knowing how to follow individuals after completing their treatment, and understanding if other family members could be at risk for cancer and allow them to undergo genetic testing.   We discussed that some people do not want to undergo genetic testing due to fear of genetic discrimination.  A federal law called the Genetic Information Non-Discrimination Act (GINA) of 2008 helps protect individuals against genetic discrimination based on their genetic test results.  It impacts both health insurance and employment.  With health insurance, it protects against increased premiums, being kicked off insurance or being forced to take a test in order to be insured.  For employment it protects against hiring, firing and promoting decisions based on genetic test results.  GINA does not apply to those in the [employer], those who work for companies with less than 15 employees, and new life insurance or long-term disability insurance policies.  Health status due to a cancer diagnosis is not protected under GINA.   We reviewed the characteristics, features and inheritance patterns of  hereditary cancer syndromes. We also discussed genetic testing, including the appropriate family members to test, the process of testing, insurance coverage and turn-around-time for results. We discussed the implications of a negative, positive, carrier and/or variant of uncertain significant result. Sheryl [name]  was offered a common hereditary cancer panel (47 genes) and an expanded pan-cancer panel (77 genes). Sheryl [name] was informed of the benefits and limitations of each panel, including that expanded pan-cancer panels contain genes that do not have clear management guidelines at this point in time.  We also discussed that as the number of genes included on a panel  increases, the chances of variants of uncertain significance increases. Sheryl [name] decided to pursue genetic testing for the Multi-cancer gene panel + RNA.  The Multi-Cancer + RNA Panel offered by [facility] includes sequencing and/or deletion/duplication analysis of the following 70 genes:  AIP*, ALK, APC*, ATM*, AXIN2*, BAP1*, BARD1*, BLM*, BMPR1A*, BRCA1*, BRCA2*, BRIP1*, CDC73*, CDH1*, CDK4, CDKN1B*, CDKN2A, CHEK2*, CTNNA1*, DICER1*, EPCAM (del/dup only), EGFR, FH*, FLCN*, GREM1 (promoter dup only), HOXB13, KIT, LZTR1, MAX*, MBD4, MEN1*, MET, MITF, MLH1*, MSH2*, MSH3*, MSH6*, MUTYH*, NF1*, NF2*, NTHL1*, PALB2*, PDGFRA, PMS2*, POLD1*, POLE*, POT1*, PRKAR1A*, PTCH1*, PTEN*, RAD51C*, RAD51D*, RB1*, RET, SDHA* (sequencing only), SDHAF2*, SDHB*, SDHC*, SDHD*, SMAD4*, SMARCA4*, SMARCB1*, SMARCE1*, STK11*, SUFU*, TMEM127*, TP53*, TSC1*, TSC2*, VHL*. RNA analysis is performed for * genes.   Based on Sheryl [name] personal and family history of cancer, she meets medical criteria for genetic testing. Despite that she meets criteria, she may still have an out of pocket cost. We discussed that if her out of pocket cost for testing is over $100, the laboratory will call and confirm whether she wants to proceed with testing.  If the out of pocket cost of  testing is less than $100 she will be billed by the genetic testing laboratory.   PLAN: After considering the risks, benefits, and limitations, Sheryl [name] provided informed consent to pursue genetic testing and the blood sample was sent to [facility] for analysis of the Multi-Cancer gene panel + RNA. Results should be available within approximately 2-3 weeks' time, at which point they will be disclosed by telephone to Sheryl [name], as will any additional recommendations warranted by these results. Sheryl [name] will receive a summary of her genetic counseling visit and a copy of her results once available. This information will also be available in Epic.   Lastly, we encouraged Sheryl [name] to remain in contact with cancer genetics annually so that we can continuously update the family history and inform her of any changes in cancer genetics and testing that may be of benefit for this family.   Sheryl [name] questions were answered to her satisfaction today. Our contact information was provided should additional questions or concerns arise. Thank you for the referral and allowing [place] to share in the care of your patient.   Taggart Prasad P. Florene [place], [place], [facility] Licensed, Insurance risk surveyor [email or web address] phone: [phone]  The patient was seen for a total of 40 minutes in face-to-face genetic counseling.  The patient was seen alone.  Drs. Michell Heinrich, and/or [place] were available for questions, if needed..    _______________________________________________________________________ For Office Staff:  Number of people involved in session: 1 Was an Intern/ student involved with case: no

## 2022-05-01 ENCOUNTER — Inpatient Hospital Stay: Payer: Commercial Managed Care - HMO | Attending: Physician Assistant | Admitting: Dietician

## 2022-05-01 ENCOUNTER — Encounter: Payer: Self-pay | Admitting: Hematology

## 2022-05-01 ENCOUNTER — Telehealth: Payer: Self-pay | Admitting: Hematology

## 2022-05-01 ENCOUNTER — Other Ambulatory Visit: Payer: Self-pay | Admitting: Nurse Practitioner

## 2022-05-01 ENCOUNTER — Other Ambulatory Visit: Payer: Commercial Managed Care - HMO

## 2022-05-01 DIAGNOSIS — Z5111 Encounter for antineoplastic chemotherapy: Secondary | ICD-10-CM | POA: Insufficient documentation

## 2022-05-01 DIAGNOSIS — Z5189 Encounter for other specified aftercare: Secondary | ICD-10-CM | POA: Insufficient documentation

## 2022-05-01 DIAGNOSIS — C786 Secondary malignant neoplasm of retroperitoneum and peritoneum: Secondary | ICD-10-CM | POA: Insufficient documentation

## 2022-05-01 DIAGNOSIS — C169 Malignant neoplasm of stomach, unspecified: Secondary | ICD-10-CM | POA: Insufficient documentation

## 2022-05-01 MED FILL — Dexamethasone Sodium Phosphate Inj 100 MG/10ML: INTRAMUSCULAR | Qty: 1 | Status: AC

## 2022-05-01 NOTE — Progress Notes (Signed)
Nutrition Assessment   Reason for Assessment: Provider referral (wt loss)   ASSESSMENT: 57 year old female with newly diagnosed gastric cancer. She is planning neoadjuvant chemotherapy, treatment plan under work-up. Patient is under the care of Dr. Burr Medico.   Past medical history includes HLD, GERD, DM  Met with patient in office. Son and daughter contacted via telephone to be present for visit today. She reports tolerating small amounts of foods that will slide down (sweet potatoes with cinnamon, boiled egg, avocado). Patient eating small portions. She is unable to tolerate eating to satiety. Patient makes a smoothie in the mornings that she sips on all day (water base with dragonfruit, honeydew, apples, soursop, protein powder). She is unable to drink liquids while eating. This makes her feel nauseas and usually followed by episode of vomiting. Patient reports last episode of vomiting on 12/2 after EGD. Patient has been limiting intake of carbohydrates, reports following more of a keto diet recently. She is drinking one premier protein (170 kcal, 30 g protein). Patient unable to tolerate cow's milk the past few months. Patient is drinking a daily tea supplement. She is asking if the ingredients are okay, says the testimonials she read about the product were great.   Nutrition Focused Physical Exam: unable to complete (wearing heavy jacket with sweatshirt/pants underneath)   Medications: biotin, D3, zofran, protonix, crestor, carafate, amlodipine, tylenol, probiotic   Labs: 12/15 labs reviewed    Anthropometrics: Weights have decreased 13.5% (28 lbs) in the last 3 months which is severe for time frame. Per chart, pt weighed 208 lb 9.6 oz on 12/29/21  Height: 5'8" Weight: 180 lb (12/15) UBW: 232 lb (August 2022) BMI: 27.37   Estimated Energy Needs  Kcals: 2275-2440 Protein: 122-138 Fluid: >2.2 L   NUTRITION DIAGNOSIS: Unintentional weight loss related to gastric cancer as evidenced by  nausea with vomiting, dietary recall, 13% wt loss in 3 months - severe for time frame   MALNUTRITION DIAGNOSIS: Patient meets criteria for severe malnutrition in the context of chronic illness related to gastric cancer as evidenced by dietary recall meeting </=75% of estimated needs for >/= one month, severe 13% weight loss in 3 months   INTERVENTION:  Educated on importance of adequate calorie and protein energy intake to maintain weights/strength during treatment Educated on all cells in body needing glucose for energy, encouraged regular diet as tolerated - handout provided  Educated on small frequent meals and snacks, eating q2h and encouraged packing snack bags to have when out running errands/appointments Discussed ways to add calories and lean protein to foods - handouts with ideas provided Continue drinking ONS, suggested switching to Ensure Plus/equivalent for added calories, recommend 2-3 day - samples of [employer], Ensure Plus, Boost Plus, Boost HP, CIB packets given for pt to try    MONITORING, EVALUATION, GOAL: Patient will tolerate increased calories and protein to minimize weight loss during treatment    Next Visit: To be scheduled with treatment plan

## 2022-05-01 NOTE — Telephone Encounter (Signed)
Spoke with patient confirming upcoming appointments  

## 2022-05-02 ENCOUNTER — Other Ambulatory Visit: Payer: Self-pay

## 2022-05-02 ENCOUNTER — Other Ambulatory Visit: Payer: Self-pay | Admitting: Hematology

## 2022-05-02 ENCOUNTER — Inpatient Hospital Stay: Payer: Commercial Managed Care - HMO

## 2022-05-02 ENCOUNTER — Encounter (HOSPITAL_COMMUNITY): Payer: Self-pay | Admitting: Surgery

## 2022-05-02 ENCOUNTER — Encounter: Payer: Self-pay | Admitting: Hematology

## 2022-05-02 DIAGNOSIS — C162 Malignant neoplasm of body of stomach: Secondary | ICD-10-CM

## 2022-05-02 MED ORDER — LIDOCAINE-PRILOCAINE 2.5-2.5 % EX CREA
TOPICAL_CREAM | CUTANEOUS | 3 refills | Status: DC
  Filled 2022-05-02: qty 30, 30d supply, fill #0

## 2022-05-02 MED ORDER — PROCHLORPERAZINE MALEATE 10 MG PO TABS
10.0000 mg | ORAL_TABLET | Freq: Four times a day (QID) | ORAL | 1 refills | Status: DC | PRN
  Filled 2022-05-02: qty 30, 8d supply, fill #0
  Filled 2022-05-20: qty 30, 8d supply, fill #1

## 2022-05-02 MED ORDER — ONDANSETRON HCL 8 MG PO TABS
8.0000 mg | ORAL_TABLET | Freq: Three times a day (TID) | ORAL | 1 refills | Status: DC | PRN
  Filled 2022-05-02: qty 30, 10d supply, fill #0
  Filled 2022-05-20: qty 30, 10d supply, fill #1

## 2022-05-02 NOTE — Progress Notes (Signed)
[facility] CALL  Patient was given pre-op instructions over the phone. Patient verbalized understanding of instructions provided.   PCP - Dr. Scarlette Calico Cardiologist - denies  Chest x-ray - 04/27/21 1 view EKG - DOS Stress Test - Denies  ECHO - 09-28-04 Cardiac Cath - denies  Sleep Study - denies CPAP -   Fasting Blood Sugar - Pt denies being a diabetic. Doesn't take medication, last A1C was 6.8 on 12/29/21. Doesn't check at home  ERAS Protcol - clears until 9am PRE-SURGERY Ensure or G2-   COVID TEST- n/a   Anesthesia review: no  Patient denies shortness of breath, fever, cough and chest pain over the phone call

## 2022-05-03 ENCOUNTER — Ambulatory Visit (HOSPITAL_COMMUNITY): Payer: Commercial Managed Care - HMO

## 2022-05-03 ENCOUNTER — Ambulatory Visit (HOSPITAL_COMMUNITY)
Admission: RE | Admit: 2022-05-03 | Discharge: 2022-05-03 | Disposition: A | Discharge status: Home / Self Care | Payer: Commercial Managed Care - HMO | Attending: Surgery | Admitting: Surgery

## 2022-05-03 ENCOUNTER — Ambulatory Visit (HOSPITAL_COMMUNITY): Payer: Commercial Managed Care - HMO | Admitting: Certified Registered"

## 2022-05-03 ENCOUNTER — Encounter (HOSPITAL_COMMUNITY): Payer: Self-pay | Admitting: Surgery

## 2022-05-03 ENCOUNTER — Ambulatory Visit (HOSPITAL_BASED_OUTPATIENT_CLINIC_OR_DEPARTMENT_OTHER): Payer: Commercial Managed Care - HMO | Admitting: Certified Registered"

## 2022-05-03 ENCOUNTER — Encounter (HOSPITAL_COMMUNITY)
Admission: RE | Disposition: A | Discharge status: Home / Self Care | Payer: Self-pay | Source: Home / Self Care | Attending: Surgery

## 2022-05-03 ENCOUNTER — Other Ambulatory Visit: Payer: Self-pay

## 2022-05-03 ENCOUNTER — Ambulatory Visit: Payer: Commercial Managed Care - HMO | Admitting: Internal Medicine

## 2022-05-03 DIAGNOSIS — C786 Secondary malignant neoplasm of retroperitoneum and peritoneum: Secondary | ICD-10-CM | POA: Insufficient documentation

## 2022-05-03 DIAGNOSIS — Z79899 Other long term (current) drug therapy: Secondary | ICD-10-CM | POA: Diagnosis not present

## 2022-05-03 DIAGNOSIS — C169 Malignant neoplasm of stomach, unspecified: Secondary | ICD-10-CM

## 2022-05-03 DIAGNOSIS — I10 Essential (primary) hypertension: Secondary | ICD-10-CM

## 2022-05-03 DIAGNOSIS — E119 Type 2 diabetes mellitus without complications: Secondary | ICD-10-CM

## 2022-05-03 DIAGNOSIS — K219 Gastro-esophageal reflux disease without esophagitis: Secondary | ICD-10-CM | POA: Diagnosis not present

## 2022-05-03 DIAGNOSIS — L720 Epidermal cyst: Secondary | ICD-10-CM | POA: Diagnosis not present

## 2022-05-03 HISTORY — PX: EXCISION OF SKIN TAG: SHX6270

## 2022-05-03 HISTORY — PX: LAPAROSCOPY: SHX197

## 2022-05-03 HISTORY — PX: PORTACATH PLACEMENT: SHX2246

## 2022-05-03 LAB — GLUCOSE, CAPILLARY
Glucose-Capillary: 93 mg/dL (ref 70–99)
Glucose-Capillary: 139 mg/dL — ABNORMAL HIGH (ref 70–99)

## 2022-05-03 SURGERY — INSERTION, TUNNELED CENTRAL VENOUS DEVICE, WITH PORT
Anesthesia: General | Site: Chest

## 2022-05-03 MED ORDER — INSULIN ASPART 100 UNIT/ML IJ SOLN: 0.0000 [IU] | INTRAMUSCULAR | Status: DC | PRN

## 2022-05-03 MED ORDER — HEPARIN SOD (PORK) LOCK FLUSH 100 UNIT/ML IV SOLN
INTRAVENOUS | Status: AC
  Filled 2022-05-03: qty 5

## 2022-05-03 MED ORDER — SUGAMMADEX SODIUM 200 MG/2ML IV SOLN
INTRAVENOUS | Status: DC | PRN
  Administered 2022-05-03: 300 mg via INTRAVENOUS

## 2022-05-03 MED ORDER — LIDOCAINE 2% (20 MG/ML) 5 ML SYRINGE
INTRAMUSCULAR | Status: DC | PRN
  Administered 2022-05-03: 60 mg via INTRAVENOUS

## 2022-05-03 MED ORDER — FENTANYL CITRATE (PF) 250 MCG/5ML IJ SOLN
INTRAMUSCULAR | Status: AC
  Filled 2022-05-03: qty 5

## 2022-05-03 MED ORDER — ONDANSETRON HCL 4 MG/2ML IJ SOLN
INTRAMUSCULAR | Status: DC | PRN
  Administered 2022-05-03: 4 mg via INTRAVENOUS

## 2022-05-03 MED ORDER — HEPARIN 6000 UNIT IRRIGATION SOLUTION
Status: AC
  Filled 2022-05-03: qty 500

## 2022-05-03 MED ORDER — MEPERIDINE HCL 25 MG/ML IJ SOLN
INTRAMUSCULAR | Status: AC
  Filled 2022-05-03: qty 1

## 2022-05-03 MED ORDER — PROPOFOL 10 MG/ML IV BOLUS
INTRAVENOUS | Status: AC
  Filled 2022-05-03: qty 20

## 2022-05-03 MED ORDER — MIDAZOLAM HCL 2 MG/2ML IJ SOLN
INTRAMUSCULAR | Status: DC | PRN
  Administered 2022-05-03: 2 mg via INTRAVENOUS

## 2022-05-03 MED ORDER — SODIUM CHLORIDE 0.9 % IR SOLN
Status: DC | PRN
  Administered 2022-05-03: 1000 mL

## 2022-05-03 MED ORDER — FENTANYL CITRATE (PF) 250 MCG/5ML IJ SOLN
INTRAMUSCULAR | Status: DC | PRN
  Administered 2022-05-03: 100 ug via INTRAVENOUS
  Administered 2022-05-03 (×2): 50 ug via INTRAVENOUS

## 2022-05-03 MED ORDER — ACETAMINOPHEN 500 MG PO TABS
1000.0000 mg | ORAL_TABLET | ORAL | Status: AC
  Administered 2022-05-03: 1000 mg via ORAL
  Filled 2022-05-03: qty 2

## 2022-05-03 MED ORDER — ACETAMINOPHEN 10 MG/ML IV SOLN: 1000.0000 mg | Freq: Once | INTRAVENOUS | Status: DC | PRN

## 2022-05-03 MED ORDER — PROPOFOL 10 MG/ML IV BOLUS
INTRAVENOUS | Status: DC | PRN
  Administered 2022-05-03: 170 mg via INTRAVENOUS

## 2022-05-03 MED ORDER — CEFAZOLIN SODIUM-DEXTROSE 2-4 GM/100ML-% IV SOLN
2.0000 g | INTRAVENOUS | Status: DC
  Filled 2022-05-03: qty 100

## 2022-05-03 MED ORDER — HEPARIN 6000 UNIT IRRIGATION SOLUTION
Status: DC | PRN
  Administered 2022-05-03: 1

## 2022-05-03 MED ORDER — PHENYLEPHRINE HCL (PRESSORS) 10 MG/ML IV SOLN
INTRAVENOUS | Status: DC | PRN
  Administered 2022-05-03 (×4): 80 ug via INTRAVENOUS

## 2022-05-03 MED ORDER — ONDANSETRON HCL 4 MG/2ML IJ SOLN
INTRAMUSCULAR | Status: AC
  Filled 2022-05-03: qty 2

## 2022-05-03 MED ORDER — ORAL CARE MOUTH RINSE: 15.0000 mL | Freq: Once | OROMUCOSAL | Status: AC

## 2022-05-03 MED ORDER — TRAMADOL HCL 50 MG PO TABS
50.0000 mg | ORAL_TABLET | Freq: Four times a day (QID) | ORAL | 0 refills | Status: AC | PRN
  Filled 2022-05-03: qty 8, 2d supply, fill #0

## 2022-05-03 MED ORDER — 0.9 % SODIUM CHLORIDE (POUR BTL) OPTIME
TOPICAL | Status: DC | PRN
  Administered 2022-05-03: 1000 mL

## 2022-05-03 MED ORDER — BUPIVACAINE-EPINEPHRINE (PF) 0.5% -1:200000 IJ SOLN
INTRAMUSCULAR | Status: AC
  Filled 2022-05-03: qty 30

## 2022-05-03 MED ORDER — MEPERIDINE HCL 25 MG/ML IJ SOLN
6.2500 mg | INTRAMUSCULAR | Status: DC | PRN
  Administered 2022-05-03: 6.25 mg via INTRAVENOUS

## 2022-05-03 MED ORDER — FENTANYL CITRATE (PF) 100 MCG/2ML IJ SOLN
25.0000 ug | INTRAMUSCULAR | Status: DC | PRN
  Administered 2022-05-03 (×2): 25 ug via INTRAVENOUS

## 2022-05-03 MED ORDER — MIDAZOLAM HCL 2 MG/2ML IJ SOLN
INTRAMUSCULAR | Status: AC
  Filled 2022-05-03: qty 2

## 2022-05-03 MED ORDER — BUPIVACAINE-EPINEPHRINE (PF) 0.5% -1:200000 IJ SOLN
INTRAMUSCULAR | Status: DC | PRN
  Administered 2022-05-03: 24 mL

## 2022-05-03 MED ORDER — DEXAMETHASONE SODIUM PHOSPHATE 10 MG/ML IJ SOLN
INTRAMUSCULAR | Status: DC | PRN
  Administered 2022-05-03: 10 mg via INTRAVENOUS

## 2022-05-03 MED ORDER — ROCURONIUM BROMIDE 10 MG/ML (PF) SYRINGE
PREFILLED_SYRINGE | INTRAVENOUS | Status: DC | PRN
  Administered 2022-05-03: 30 mg via INTRAVENOUS
  Administered 2022-05-03: 60 mg via INTRAVENOUS
  Administered 2022-05-03: 10 mg via INTRAVENOUS

## 2022-05-03 MED ORDER — EPHEDRINE SULFATE (PRESSORS) 50 MG/ML IJ SOLN
INTRAMUSCULAR | Status: DC | PRN
  Administered 2022-05-03: 5 mg via INTRAVENOUS

## 2022-05-03 MED ORDER — HEPARIN SOD (PORK) LOCK FLUSH 100 UNIT/ML IV SOLN
INTRAVENOUS | Status: DC | PRN
  Administered 2022-05-03: 500 [IU] via INTRAVENOUS

## 2022-05-03 MED ORDER — CHLORHEXIDINE GLUCONATE 0.12 % MT SOLN
15.0000 mL | Freq: Once | OROMUCOSAL | Status: AC
  Administered 2022-05-03: 15 mL via OROMUCOSAL
  Filled 2022-05-03: qty 15

## 2022-05-03 MED ORDER — FENTANYL CITRATE (PF) 100 MCG/2ML IJ SOLN
INTRAMUSCULAR | Status: AC
  Filled 2022-05-03: qty 2

## 2022-05-03 MED ORDER — LACTATED RINGERS IV SOLN: INTRAVENOUS | Status: DC

## 2022-05-03 SURGICAL SUPPLY — 63 items
ADH SKN CLS APL DERMABOND .7 (GAUZE/BANDAGES/DRESSINGS) ×4
APL PRP STRL LF DISP 70% ISPRP (MISCELLANEOUS) ×2
BAG COUNTER SPONGE SURGICOUNT (BAG) ×3 IMPLANT
BAG DECANTER FOR FLEXI CONT (MISCELLANEOUS) ×3 IMPLANT
BAG SPNG CNTER NS LX DISP (BAG) ×2
BLADE CLIPPER SURG (BLADE) IMPLANT
BLADE SURG 15 STRL LF DISP TIS (BLADE) IMPLANT
BLADE SURG 15 STRL SS (BLADE) ×2
CANISTER SUCT 3000ML PPV (MISCELLANEOUS) IMPLANT
CHLORAPREP W/TINT 26 (MISCELLANEOUS) ×3 IMPLANT
COVER SURGICAL LIGHT HANDLE (MISCELLANEOUS) ×3 IMPLANT
COVER TRANSDUCER ULTRASND GEL (DISPOSABLE) IMPLANT
DERMABOND ADVANCED .7 DNX12 (GAUZE/BANDAGES/DRESSINGS) ×3 IMPLANT
DRAPE C-ARM 42X120 X-RAY (DRAPES) ×3 IMPLANT
DRAPE LAPAROSCOPIC ABDOMINAL (DRAPES) ×3 IMPLANT
DRAPE UTILITY XL STRL (DRAPES) IMPLANT
DRSG TELFA 3X8 NADH STRL (GAUZE/BANDAGES/DRESSINGS) IMPLANT
ELECT CAUTERY BLADE 6.4 (BLADE) ×3 IMPLANT
ELECT REM PT RETURN 9FT ADLT (ELECTROSURGICAL) ×2
ELECTRODE REM PT RTRN 9FT ADLT (ELECTROSURGICAL) ×3 IMPLANT
GAUZE 4X4 16PLY ~~LOC~~+RFID DBL (SPONGE) IMPLANT
GEL ULTRASOUND 20GR AQUASONIC (MISCELLANEOUS) IMPLANT
GLOVE BIOGEL PI IND STRL 6 (GLOVE) ×3 IMPLANT
GLOVE SURG POLY MICRO LF SZ5.5 (GLOVE) ×3 IMPLANT
GLOVE SURG SYN 5.5 (GLOVE) ×2 IMPLANT
GLOVE SURG SYN 5.5 PF PI (GLOVE) ×3 IMPLANT
GLOVE SURG UNDER POLY LF SZ6 (GLOVE) ×3 IMPLANT
GOWN STRL REUS W/ TWL LRG LVL3 (GOWN DISPOSABLE) ×6 IMPLANT
GOWN STRL REUS W/TWL LRG LVL3 (GOWN DISPOSABLE) ×4
INTRODUCER COOK 11FR (CATHETERS) IMPLANT
IRRIG SUCT STRYKERFLOW 2 WTIP (MISCELLANEOUS) ×2
IRRIGATION SUCT STRKRFLW 2 WTP (MISCELLANEOUS) IMPLANT
KIT BASIN OR (CUSTOM PROCEDURE TRAY) ×3 IMPLANT
KIT PORT POWER 8FR ISP CVUE (Port) IMPLANT
KIT TURNOVER KIT B (KITS) ×3 IMPLANT
L-HOOK LAP DISP 36CM (ELECTROSURGICAL) ×2
LHOOK LAP DISP 36CM (ELECTROSURGICAL) IMPLANT
NDL INSUFFLATION 14GA 120MM (NEEDLE) ×3 IMPLANT
NEEDLE INSUFFLATION 14GA 120MM (NEEDLE) ×2 IMPLANT
NS IRRIG 1000ML POUR BTL (IV SOLUTION) ×3 IMPLANT
PACK UNIVERSAL I (CUSTOM PROCEDURE TRAY) IMPLANT
PAD ARMBOARD 7.5X6 YLW CONV (MISCELLANEOUS) ×6 IMPLANT
PENCIL BUTTON HOLSTER BLD 10FT (ELECTRODE) ×3 IMPLANT
POSITIONER HEAD DONUT 9IN (MISCELLANEOUS) ×3 IMPLANT
SCISSORS LAP 5X35 DISP (ENDOMECHANICALS) IMPLANT
SET INTRODUCER 12FR PACEMAKER (INTRODUCER) IMPLANT
SET IRRIG TUBING LAPAROSCOPIC (IRRIGATION / IRRIGATOR) IMPLANT
SET SHEATH INTRODUCER 10FR (MISCELLANEOUS) IMPLANT
SET TUBE SMOKE EVAC HIGH FLOW (TUBING) ×3 IMPLANT
SHEATH COOK PEEL AWAY SET 9F (SHEATH) IMPLANT
SLEEVE ENDOPATH XCEL 5M (ENDOMECHANICALS) ×3 IMPLANT
SUT MNCRL AB 4-0 PS2 18 (SUTURE) ×3 IMPLANT
SUT PROLENE 2 0 SH DA (SUTURE) ×6 IMPLANT
SUT VIC AB 3-0 SH 27 (SUTURE) ×2
SUT VIC AB 3-0 SH 27X BRD (SUTURE) ×3 IMPLANT
SYR 5ML LUER SLIP (SYRINGE) ×3 IMPLANT
TOWEL GREEN STERILE (TOWEL DISPOSABLE) ×3 IMPLANT
TOWEL GREEN STERILE FF (TOWEL DISPOSABLE) ×3 IMPLANT
TRAP SPECIMEN MUCUS 40CC (MISCELLANEOUS) IMPLANT
TRAY LAPAROSCOPIC MC (CUSTOM PROCEDURE TRAY) ×3 IMPLANT
TROCAR XCEL BLUNT TIP 100MML (ENDOMECHANICALS) IMPLANT
TROCAR Z-THREAD OPTICAL 5X100M (TROCAR) ×3 IMPLANT
WARMER LAPAROSCOPE (MISCELLANEOUS) ×3 IMPLANT

## 2022-05-03 NOTE — Transfer of Care (Signed)
Immediate Anesthesia Transfer of Care Note  [name]  Procedure(s) Performed: INSERTION PORT-A-CATH WITH ULTRASOUND GUIDANCE LAPAROSCOPY DIAGNOSTIC WITH PERITONEAL WASHINGS EXCISION OF CHEST WALL SKIN LESION (Chest)  Patient Location: PACU  Anesthesia Type:General  Level of Consciousness: awake, alert , and sedated  Airway & Oxygen Therapy: Patient connected to face mask oxygen  Post-op Assessment: Post -op Vital signs reviewed and stable  Post vital signs: stable  Last Vitals:  Vitals Value Taken Time  BP 136/95 05/03/22 1445  Temp    Pulse 95 05/03/22 1446  Resp 24 05/03/22 1446  SpO2 97 % 05/03/22 1446  Vitals shown include unvalidated device data.  Last Pain:  Vitals:   05/03/22 1145  TempSrc:   PainSc: 0-No pain      Patients Stated Pain Goal: 3 (81/27/51 7001)  Complications: No notable events documented.

## 2022-05-03 NOTE — Anesthesia Procedure Notes (Signed)
Procedure Name: Intubation Date/Time: 05/03/2022 12:40 PM  Performed by: Lavell Luster, CRNAPre-anesthesia Checklist: Patient identified, Emergency Drugs available, Suction available, Patient being monitored and Timeout performed Patient Re-evaluated:Patient Re-evaluated prior to induction Oxygen Delivery Method: Circle system utilized Preoxygenation: Pre-oxygenation with 100% oxygen Induction Type: IV induction and Cricoid Pressure applied Ventilation: Mask ventilation without difficulty Laryngoscope Size: Mac and 3 Grade View: Grade III Tube type: Oral Tube size: 7.5 mm Number of attempts: 1 Airway Equipment and Method: Stylet Placement Confirmation: ETT inserted through vocal cords under direct vision, positive ETCO2 and breath sounds checked- equal and bilateral Secured at: 22 cm Tube secured with: Tape Dental Injury: Teeth and Oropharynx as per pre-operative assessment

## 2022-05-03 NOTE — Discharge Instructions (Addendum)
SURGERY DISCHARGE INSTRUCTIONS: PORT-A-CATH PLACEMENT  Activity You may resume your usual activities as tolerated Ok to shower in 24 hours, but do not bathe or submerge incisions underwater. Do not drive while taking narcotic pain medication.  Wound Care Your incisions are covered with skin glue called Dermabond. This will peel off on its own over time. You may shower and allow warm soapy water to run over your incisions. Gently pat dry. Do not submerge your incisions underwater for 2 weeks. Monitor your incision for any new redness, tenderness, or drainage. You may start using your port in 48 hours. Do not apply EMLA cream directly over the Dermabond (skin glue).  When to Call us: Fever greater than 100.5 New redness, drainage, or swelling at incision site Severe pain, nausea, or vomiting Shortness of breath, difficulty breathing  For questions or concerns, please call the [facility] office at [phone]. You have a follow up appointment with Dr. Zenia Resides on May 22, 2022 at 10:50am. This will be at the [facility] office at [address], [place], [place] [address]. Please arrive 15 minutes prior to your scheduled appointment time.

## 2022-05-03 NOTE — H&P (Signed)
Sheryl [name] is an 57 y.o. female.   Chief Complaint: gastric cancer HPI: Sheryl [name] is a 57 y.o. female who was referred to me for evaluation of newly-diagnosed gastric adenocarcinoma. For the past several months she has been having upper abdominal pain, nausea/vomiting and weight loss. She was ultimately referred to GI and underwent an EGD on 12/1. This showed multiple polyps in the gastric body and fundus, as well as diffuse erythema and friability in the cardia, fundus and body of the stomach. There was also an ulcer on the greater curve which was biopsied. The biopsies of the ulcer, as well as the polyps, all showed adenocarcinoma. She was referred to discuss surgery and was first seen by me on 12/13. Staging CT scans showed no evidence of metastatic disease. EUS was performed on 12/21 and was consistent with diffuse gastric cancer, staged as T2. Lymph node assessment was limited. After multidisciplinary review, neoadjuvant chemotherapy was agreed upon. The patient is here today for port placement and staging laparoscopy with washings prior to beginning treatment. She starts chemotherapy next week. She also requests removal of a skin lesion on the chest wall, which has been present for a few years.  Her only prior abdominal surgery is a hysterectomy. She has no known cardiopulmonary disease and does not take any blood thinners. She is here today with her father and her children were present via phone.   Past Medical History:  Diagnosis Date   Blood transfusion without reported diagnosis    had transfusion with hysterectomy   Cataract    Colon polyps 2012   Diabetes ([place]) 03/13/2021   Diabetes ([place]) 05/21/2019   Family history of breast cancer    Family history of pancreatic cancer    Family history of stomach cancer    Fibroid    GERD (gastroesophageal reflux disease)    H/O blood clots    History of hysterectomy    fibroids and heavy cycles   Hypertension     Past Surgical  History:  Procedure Laterality Date   ABDOMINAL HYSTERECTOMY     BIOPSY  04/19/2022   Procedure: BIOPSY;  Surgeon: Irving Copas., MD;  Location: [facility];  Service: Gastroenterology;;   COLONOSCOPY     ESOPHAGOGASTRODUODENOSCOPY (EGD) WITH PROPOFOL N/A 04/19/2022   Procedure: ESOPHAGOGASTRODUODENOSCOPY (EGD) WITH PROPOFOL;  Surgeon: Irving Copas., MD;  Location: [facility];  Service: Gastroenterology;  Laterality: N/A;   EUS N/A 04/19/2022   Procedure: UPPER ENDOSCOPIC ULTRASOUND (EUS) RADIAL;  Surgeon: Irving Copas., MD;  Location: [facility];  Service: Gastroenterology;  Laterality: N/A;   POLYPECTOMY  04/19/2022   Procedure: POLYPECTOMY;  Surgeon: Mansouraty, Telford Nab., MD;  Location: [facility];  Service: Gastroenterology;;   UPPER GASTROINTESTINAL ENDOSCOPY      Family History  Problem Relation Age of Onset   Stroke Mother    Diabetes Mother    Hypertension Mother    Multiple myeloma Mother    Stroke Father    Pancreatic cancer Maternal Aunt    Stomach cancer Maternal Uncle    Breast cancer Paternal Aunt    Stomach cancer Paternal Aunt    Stomach cancer Paternal Uncle    Heart attack Maternal Grandmother    Breast cancer Paternal Grandmother    Diabetes Other    Hypertension Other    Stroke Other    Cancer Other    Heart attack Other    Esophageal cancer Neg Hx    Liver disease Neg Hx  Colon cancer Neg Hx    Rectal cancer Neg Hx    Social History:  reports that she has never smoked. She has never used smokeless tobacco. She reports that she does not drink alcohol and does not use drugs.  Allergies:  Allergies  Allergen Reactions   Aspirin Anaphylaxis   Cyclobenzaprine Anaphylaxis   Naproxen Sodium Anaphylaxis   Zithromax [Azithromycin Dihydrate] Anaphylaxis   Dilaudid [Hydromorphone] Nausea And Vomiting    Facility-Administered Medications Prior to Admission  Medication Dose Route Frequency Provider Last Rate Last  Admin   0.9 %  sodium chloride infusion  500 mL Intravenous Continuous [place], MD       Medications Prior to Admission  Medication Sig Dispense Refill   acetaminophen (TYLENOL) 500 MG tablet Take 1,000 mg by mouth every 8 (eight) hours as needed (pain).     amLODipine (NORVASC) 10 MG tablet TAKE 1 TABLET (10 MG TOTAL) BY MOUTH DAILY. 90 tablet 1   Bacillus Coagulans-Inulin (PROBIOTIC-PREBIOTIC) 1-250 BILLION-MG CAPS Take 2 capsules by mouth daily.     Biotin 1000 MCG CHEW Chew 1,000 mcg by mouth daily.     Blood Glucose Monitoring Suppl (TRUE METRIX METER) w/Device KIT 1 kit by Does not apply route 3 (three) times daily as needed. 1 kit 0   Cholecalciferol (VITAMIN D3) 125 MCG (5000 UT) TABS Take 5,000 Units by mouth daily.     lidocaine-prilocaine (EMLA) cream Apply to affected area once as directed. 30 g 3   ondansetron (ZOFRAN) 8 MG tablet Take 1 tablet (8 mg total) by mouth every 8 (eight) hours as needed for nausea or vomiting. Start on the third day after chemotherapy. 30 tablet 1   ondansetron (ZOFRAN-ODT) 4 MG disintegrating tablet Take 1 tablet (4 mg total) by mouth every 6 (six) hours. Dissolve one tab on tongue 30 minutes before each dose of bowel prep (Patient not taking: Reported on 04/18/2022) 2 tablet 0   pantoprazole (PROTONIX) 40 MG tablet Take 1 tablet (40 mg total) by mouth 2 (two) times daily. 180 tablet 0   prochlorperazine (COMPAZINE) 10 MG tablet Take 1 tablet (10 mg total) by mouth every 6 (six) hours as needed for nausea or vomiting. 30 tablet 1   rosuvastatin (CRESTOR) 5 MG tablet Take 1 tablet (5 mg total) by mouth daily. (Patient not taking: Reported on 04/18/2022) 90 tablet 1   sucralfate (CARAFATE) 1 g tablet Take 1 tablet (1 g total) by mouth 2 (two) times daily. 60 tablet 6    Results for orders placed or performed during the hospital encounter of 05/03/22 (from the past 48 hour(s))  Glucose, capillary     Status: None   Collection Time: 05/03/22 11:35  AM  Result Value Ref Range   Glucose-Capillary 93 70 - 99 mg/dL    Comment: Glucose reference range applies only to samples taken after fasting for at least 8 hours.   No results found.  Review of Systems  Blood pressure (!) 147/96, pulse 63, temperature 97.9 F (36.6 C), temperature source Oral, resp. rate 16, height _0  (1.727 m), weight 79.8 kg, last menstrual period 06/29/2010, SpO2 99 %. Physical Exam Constitutional:      General: She is not in acute distress.    Appearance: Normal appearance.  HENT:     Head: Normocephalic and atraumatic.  Pulmonary:     Effort: Pulmonary effort is normal. No respiratory distress.  Abdominal:     General: There is no distension.  Palpations: Abdomen is soft.     Tenderness: There is no abdominal tenderness.  Skin:    Comments: 0.5cm raised pigmented lesion overlying the upper sternum, well-circumscribed.  Neurological:     General: No focal deficit present.     Mental Status: She is alert and oriented to person, place, and time.      Assessment/Plan 57 yo female with diffuse gastric adenocarcinoma. Proceed to the OR for port placement, staging laparoscopy, and excision of chest wall skin lesion. Informed consent obtained. Plan for discharge home from PACU. All questions answered.  Dwan Bolt, MD 05/03/2022, 11:43 AM

## 2022-05-03 NOTE — Anesthesia Preprocedure Evaluation (Addendum)
Anesthesia Evaluation  Patient identified by MRN, date of birth, ID band Patient awake    Reviewed: Allergy & Precautions, NPO status , Patient's Chart, lab work & pertinent test results  Airway Mallampati: III       Dental no notable dental hx.    Pulmonary neg pulmonary ROS   Pulmonary exam normal        Cardiovascular hypertension, Pt. on medications  Rhythm:Regular Rate:Normal     Neuro/Psych negative neurological ROS  negative psych ROS   GI/Hepatic Neg liver ROS,GERD  Medicated,,  Endo/Other  diabetes    Renal/GU negative Renal ROS  negative genitourinary   Musculoskeletal negative musculoskeletal ROS (+)    Abdominal Normal abdominal exam  (+)   Peds  Hematology negative hematology ROS (+)   Anesthesia Other Findings   Reproductive/Obstetrics                             Anesthesia Physical Anesthesia Plan  ASA: 3  Anesthesia Plan: General   Post-op Pain Management:    Induction: Intravenous  PONV Risk Score and Plan: 3 and Ondansetron, Dexamethasone, Midazolam and Treatment may vary due to age or medical condition  Airway Management Planned: Mask and Oral ETT  Additional Equipment: None  Intra-op Plan:   Post-operative Plan: Extubation in OR  Informed Consent: I have reviewed the patients History and Physical, chart, labs and discussed the procedure including the risks, benefits and alternatives for the proposed anesthesia with the patient or authorized representative who has indicated his/her understanding and acceptance.     Dental advisory given  Plan Discussed with: CRNA  Anesthesia Plan Comments:        Anesthesia Quick Evaluation

## 2022-05-04 ENCOUNTER — Other Ambulatory Visit: Payer: Self-pay

## 2022-05-04 ENCOUNTER — Encounter (HOSPITAL_COMMUNITY): Payer: Self-pay | Admitting: Surgery

## 2022-05-04 LAB — CYTOLOGY - NON PAP

## 2022-05-04 MED ORDER — PEGFILGRASTIM INJECTION 6 MG/0.6ML ~~LOC~~: 6.0000 mg | PREFILLED_SYRINGE | Freq: Once | SUBCUTANEOUS | 0 refills | Status: AC

## 2022-05-04 NOTE — Progress Notes (Signed)
Pt's insurance require home administration of Neulasta Injection.  Prescription sent to [facility].

## 2022-05-04 NOTE — Anesthesia Postprocedure Evaluation (Signed)
Anesthesia Post Note  [name]  Procedure(s) Performed: INSERTION PORT-A-CATH WITH ULTRASOUND GUIDANCE LAPAROSCOPY DIAGNOSTIC WITH PERITONEAL WASHINGS EXCISION OF CHEST WALL SKIN LESION (Chest)     Patient location during evaluation: PACU Anesthesia Type: General Level of consciousness: awake and alert Pain management: pain level controlled Vital Signs Assessment: post-procedure vital signs reviewed and stable Respiratory status: spontaneous breathing, nonlabored ventilation, respiratory function stable and patient connected to nasal cannula oxygen Cardiovascular status: blood pressure returned to baseline and stable Postop Assessment: no apparent nausea or vomiting Anesthetic complications: no   No notable events documented.  Last Vitals:  Vitals:   05/03/22 1615 05/03/22 1630  BP: 120/83 115/83  Pulse: 86 85  Resp: 14 13  Temp:  36.6 C  SpO2: 93% 95%    Last Pain:  Vitals:   05/03/22 1600  TempSrc:   PainSc: 10-Worst pain ever                 Belenda Cruise P Damario Gillie

## 2022-05-04 NOTE — Op Note (Signed)
Date: 05/03/2021  [name] MRN: [id number]  Preoperative Diagnosis: Gastric adenocarcinoma, skin lesion Postoperative Diagnosis: Same  Procedure:  Port-a-cath insertion Excision of 0.5cm benign chest wall skin lesion Staging laparoscopy with peritoneal biopsies and washings  Surgeon: Michaelle Birks, MD  EBL: Minimal  Anesthesia: General endotracheal  Specimens:  Chest wall skin lesion Peritoneal nodule #1 Peritoneal nodule #2 Peritoneal washings  Indications: Ms. Prewitt is a 57 yo female who was recently diagnosed with diffuse gastric adenocarcinoma. She is to begin neoadjuvant chemotherapy and presents today for port placement and staging laparoscopy prior to treatment. She also requested removal of a skin lesion from the chest wall.  Findings: 8-Fr single-lumen power port placed via the right internal jugular vein under ultrasound guidance. Catheter kinked on initial placement, requiring replacement at a new site in the IJ. Benign-appearing 0.5cm skin lesion on the chest wall. Plaque-like peritoneal nodules in the upper abdomen, likely benign but sampled. No other evidence of gross metastatic disease within the abdomen.  Procedure details: Informed consent was obtained in the preoperative area prior to the procedure. The patient was brought to the operating room and placed on the table in the supine position. General anesthesia was induced and appropriate lines and drains were placed for intraoperative monitoring. Perioperative antibiotics were administered per SCIP guidelines. The neck, chest and abdomen were prepped and draped in the usual sterile fashion. A pre-procedure timeout was taken verifying patient identity, surgical site and procedure to be performed.  The patient was placed in Trendelenberg and the right internal jugular vein was accessed with a large-bore needle. A guidewire was inserted and advanced, and position in the SVC was confirmed  fluoroscopically. The needle was removed and the wire was clipped to the drapes to secure its position. Next a place for the port was chosen on the upper lateral right chest, a small skin incision was made and a subcutaneous pocket was created with cautery. The port and catheter were then flushed and brought onto the field. Three 2-0 prolene sutures were used to secure the port in the subcutaneous pocket, but the sutures were not tied down. The port was placed in the pocket and the attached cathether was tunneled beneath the skin to the wire exit site. The catheter was then measured using fluoro - it was placed over the skin adjacent to the guidewire, and marked externally at the cavoatrial junction. The catheter was then cut at this location. The dilator and sheath were then advanced over the guidewire under fluoroscopic guidance, and the wire and dilator were removed. The end of the catheter was inserted through the sheath and advanced, and the sheath was peeled away. The port was then accessed with a Huber needle, and blood could not be aspirated. Fluoroscopic imaging confirmed that the catheter was kinked in the neck near the insertion into the IJ.  Multiple attempts were made to manipulate the catheter to straighten the kink but these were not successful.  The decision was made to re-tunnel the catheter to try to resolve the kink.  The catheter was detached from the port and the proximal end up into the neck at the site of venous insertion.  The catheter was then tunneled through a new tract down to the port site and reattached to the port.  The catheter still appeared kinked on fluoroscopic imaging and blood could not be aspirated.  Thus the decision was made to remove the catheter and insert a new catheter at a new site in the  vein.  The catheter was detached from the port and completely removed.  A new site was identified in the right IJ with ultrasound guidance inferior to the previous venous insertion  site.  The vein was accessed with a large-bore needle under ultrasound guidance, and a guidewire was inserted and advanced.  Position in the SVC was confirmed fluoroscopically.  The needle was removed and the wire was clipped to the drapes to secure its position.  A new catheter was flushed and brought onto the field and attached to the port.  The catheter was tunneled through the subcutaneous tissue to the wire exit site in the neck.  The catheter was then measured externally using fluoroscopy and cut to appropriate size.  The dilator and sheath were advanced over the guidewire under fluoroscopic guidance, and the wire and dilator were removed.  The end of the catheter was inserted through the sheath and advanced, and the sheath was peeled away.  On initial fluoroscopic imaging, there was slight kinking of the catheter, but with minimal manipulation of the catheter tubing, the kink resolved.  Port was accessed with a Huber needle and blood was able to be aspirated easily.  The port was flushed with heparinized saline.  Final fluoroscopic imaging confirmed appropriate positioning of the catheter tip within the SVC with no kinking of the tubing.  The Prolene sutures were tied down. The skin was closed with a deep dermal layer of interrupted 3-0 Vicryl suture. A final flush of 500 units heparin (100 units/mL) was given via the port.  The skin was then closed with subcuticular 4 Monocryl suture.  Dermabond was applied.  Next, the chest wall skin lesion was excised.  A transverse elliptical skin incision was made around the nevus and the subcutaneous tissue was divided with cautery.  The lesion was completely excised, and measured approximately 0.5 cm in largest dimension.  The specimen was passed off the field and sent for routine pathology.  Hemostasis was achieved in the wound with cautery. The deep dermis was closed with 3-0 Vicryl suture, and the skin was closed with running subcuticular 4 Monocryl suture.   Dermabond was applied.  An infraumbilical skin incision was made, and the subcutaneous tissue was divided with cautery. The umbilical stalk was grasped and elevated. A Veress needle was inserted through the fascia, and intraperitoneal placement was confirmed with the saline drop test.  A 5 mm Visiport was placed.  The peritoneal cavity was inspected, including the liver, both hemidiaphragms, and the peritoneal surface throughout the abdomen.  There were multiple flat, plaque-like nodules in the upper abdomen on both hemidiaphragms and the anterior abdominal wall.  No liver nodules were noted, and no other evidence of metastatic disease within the abdomen was identified.  Additional 5 mm ports were placed in the right upper quadrant and left mid abdomen.  One of the nodules on the right anterior abdominal wall was grasped and the peritoneal layer was excised using cautery and sharp dissection.  The nodule was sent for routine pathology.  An additional nodule on the left anterior abdominal wall near the diaphragm was grasped with a biopsy forceps and excised, and sent for routine pathology.  Next the abdomen was irrigated with sterile saline and washings were collected and sent for cytology.  The biopsy sites were examined and appeared hemostatic.  The ports were removed under direct visualization and the abdomen was desufflated.  The port sites were closed with subcuticular 4-0 Monocryl suture followed by Dermabond.  The  patient tolerated the procedure well with no apparent complications.  All counts were correct x2 at the end of the procedure. The patient was extubated and taken to PACU in stable condition.  Michaelle Birks, MD 05/04/22 7:57 AM

## 2022-05-07 LAB — SURGICAL PATHOLOGY

## 2022-05-07 NOTE — Progress Notes (Signed)
Order placed for PD-L1 and FO testing via [place] on line portal.   Statement of medical necessity and pathology report submitted.

## 2022-05-08 MED FILL — Dexamethasone Sodium Phosphate Inj 100 MG/10ML: INTRAMUSCULAR | Qty: 1 | Status: AC

## 2022-05-08 NOTE — Assessment & Plan Note (Addendum)
-  Sheryl [name] with peritoneal metastasis.  -with diffuse involvement of probably entire stomach, with signet ring features, although CT scan negative for metastasis, exp lap found peritoneal mets, both biopsy and peritoneal wash cytology were positive for adenocarcinoma -I discussed her recent exploratory laparoscopy findings, and the pathology results.  Unfortunately she has peritoneal metastasis, and her cancer is not curable. -I have requested molecular testing, including HER2, MMR, PD-L1 and Foundation One, to see if she is a candidate for targeted therapy and immunotherapy. -She is scheduled to start first-line chemotherapy FLOT, I will change to FOLFOX given the incurable nature of her cancer

## 2022-05-08 NOTE — Progress Notes (Unsigned)
[facility]   [phone] [phone]   Clinic Follow up Note   Patient Care Team: Janith Lima, MD as PCP - General (Internal Medicine) Truitt Merle, MD as Consulting Physician (Oncology)  Date of Service:  05/08/2022  CHIEF COMPLAINT: f/u of Gastric Cancer     CURRENT THERAPY:   FLOT q14d   ASSESSMENT: *** [name] is a 57 y.o. female with   No problem-specific Assessment & Plan notes found for this encounter.  ***   PLAN:   SUMMARY OF ONCOLOGIC HISTORY: Oncology History  Gastric cancer ([place])  03/30/2022 Procedure   EGD:  Impression:  - Normal esophagus. - A few gastric polyps. Biopsied. - Gastritis. Biopsied. - Non-bleeding gastric ulcer with no stigmata of bleeding. Biopsied. - Normal examined duodenum. Biopsied.  Findings: Diffuse moderate inflammation characterized by congestion (edema), friability and granularity was found in the cardia, in the gastric fundus and in the gastric body. There were associated erosions in multiple places. Biopsies were taken from the antrum, body, and fundus with a cold forceps for histology. Estimated blood loss was minimal.  One non-bleeding cratered gastric ulcer with no stigmata of bleeding was found on the greater curvature of the stomach. The lesion was 6 mm in largest dimension. The mucosa around the ulcer was heaped and led to some deformity in the antrum. Biopsies were taken with a cold forceps for histology. Estimated blood loss was minimal.    03/30/2022 Pathology Results   Patient: Sheryl, [name]  Accession: [id number]  Diagnosis 1. Surgical [P], duodenal - BENIGN SMALL BOWEL MUCOSA WITH NO SIGNIFICANT PATHOLOGIC CHANGES 2. Surgical [P], gastric antrum - GASTRIC ANTRAL MUCOSA WITH FEATURES OF REACTIVE GASTROPATHY - NEGATIVE FOR H. PYLORI ON H&E STAIN - NEGATIVE FOR INTESTINAL METAPLASIA OR MALIGNANCY 3. Surgical [P], gastric body - GASTRIC OXYNTIC MUCOSA WITH  REACTIVE/REPARATIVE CHANGES - NEGATIVE FOR H. PYLORI ON H&E STAIN - NEGATIVE FOR INTESTINAL METAPLASIA, DYSPLASIA OR MALIGNANCY 4. Surgical [P], greater curve ulceration - ADENOCARCINOMA WITH SIGNET RING CELL FEATURES (SEE NOTE) 5. Surgical [P], gastric polyps - ADENOCARCINOMA WITH SIGNET RING CELL FEATURES (SEE NOTE) 6. Surgical [P], fundus (gastric) - ADENOCARCINOMA WITH SIGNET RING CELL FEATURES (SEE NOTE) 7. Surgical [P], colon, ascending, polyp (1) - TUBULAR ADENOMA. - NO HIGH GRADE DYSPLASIA OR MALIGNANCY. 8. Surgical [P], colon, transverse, polyp (1) - TUBULAR ADENOMA. - NO HIGH GRADE DYSPLASIA OR MALIGNANCY.    04/13/2022 Initial Diagnosis   Gastric cancer ([place])   04/19/2022 Cancer Staging   Staging form: Stomach, AJCC 8th Edition - Clinical stage from 04/19/2022: Stage IVB (cT2, cN0, pM1) - Signed by Truitt Merle, MD on 05/08/2022 Total positive nodes: 0   05/07/2022 -  Chemotherapy   Patient is on Treatment Plan : GASTROESOPHAGEAL FLOT q14d X 4 cycles        INTERVAL HISTORY: *** Sheryl [name] is here for a follow up of Gastric Cancer    She was last seen by me on 04/20/22 She presents to the clinic      All other systems were reviewed with the patient and are negative.  MEDICAL HISTORY:  Past Medical History:  Diagnosis Date   Blood transfusion without reported diagnosis    had transfusion with hysterectomy   Cataract    Colon polyps 2012   Diabetes ([place]) 03/13/2021   Diabetes ([place]) 05/21/2019   Family history of breast cancer    Family history of pancreatic cancer    Family history of stomach  cancer    Fibroid    GERD (gastroesophageal reflux disease)    H/O blood clots    History of hysterectomy    fibroids and heavy cycles   Hypertension     SURGICAL HISTORY: Past Surgical History:  Procedure Laterality Date   ABDOMINAL HYSTERECTOMY     BIOPSY  04/19/2022   Procedure: BIOPSY;  Surgeon: Irving Copas., MD;  Location: [facility];   Service: Gastroenterology;;   COLONOSCOPY     ESOPHAGOGASTRODUODENOSCOPY (EGD) WITH PROPOFOL N/A 04/19/2022   Procedure: ESOPHAGOGASTRODUODENOSCOPY (EGD) WITH PROPOFOL;  Surgeon: Irving Copas., MD;  Location: [facility];  Service: Gastroenterology;  Laterality: N/A;   EUS N/A 04/19/2022   Procedure: UPPER ENDOSCOPIC ULTRASOUND (EUS) RADIAL;  Surgeon: Irving Copas., MD;  Location: [facility];  Service: Gastroenterology;  Laterality: N/A;   EXCISION OF SKIN TAG  05/03/2022   Procedure: EXCISION OF CHEST WALL SKIN LESION;  Surgeon: Dwan Bolt, MD;  Location: [facility];  Service: General;;   LAPAROSCOPY N/A 05/03/2022   Procedure: LAPAROSCOPY DIAGNOSTIC WITH PERITONEAL WASHINGS;  Surgeon: Dwan Bolt, MD;  Location: [facility];  Service: General;  Laterality: N/A;   POLYPECTOMY  04/19/2022   Procedure: POLYPECTOMY;  Surgeon: Irving Copas., MD;  Location: [facility];  Service: Gastroenterology;;   PORTACATH PLACEMENT N/A 05/03/2022   Procedure: INSERTION PORT-A-CATH WITH ULTRASOUND GUIDANCE;  Surgeon: Dwan Bolt, MD;  Location: [facility];  Service: General;  Laterality: N/A;   UPPER GASTROINTESTINAL ENDOSCOPY      I have reviewed the social history and family history with the patient and they are unchanged from previous note.  ALLERGIES:  is allergic to aspirin, cyclobenzaprine, naproxen sodium, zithromax [azithromycin dihydrate], and dilaudid [hydromorphone].  MEDICATIONS:  Current Outpatient Medications  Medication Sig Dispense Refill   acetaminophen (TYLENOL) 500 MG tablet Take 1,000 mg by mouth every 8 (eight) hours as needed (pain).     amLODipine (NORVASC) 10 MG tablet TAKE 1 TABLET (10 MG TOTAL) BY MOUTH DAILY. 90 tablet 1   Bacillus Coagulans-Inulin (PROBIOTIC-PREBIOTIC) 1-250 BILLION-MG CAPS Take 2 capsules by mouth daily.     Biotin 1000 MCG CHEW Chew 1,000 mcg by mouth daily.     Blood Glucose Monitoring Suppl (TRUE METRIX METER) w/Device KIT 1 kit by  Does not apply route 3 (three) times daily as needed. 1 kit 0   Cholecalciferol (VITAMIN D3) 125 MCG (5000 UT) TABS Take 5,000 Units by mouth daily.     lidocaine-prilocaine (EMLA) cream Apply to affected area once as directed. 30 g 3   ondansetron (ZOFRAN) 8 MG tablet Take 1 tablet (8 mg total) by mouth every 8 (eight) hours as needed for nausea or vomiting. Start on the third day after chemotherapy. 30 tablet 1   ondansetron (ZOFRAN-ODT) 4 MG disintegrating tablet Take 1 tablet (4 mg total) by mouth every 6 (six) hours. Dissolve one tab on tongue 30 minutes before each dose of bowel prep (Patient not taking: Reported on 04/18/2022) 2 tablet 0   pantoprazole (PROTONIX) 40 MG tablet Take 1 tablet (40 mg total) by mouth 2 (two) times daily. 180 tablet 0   prochlorperazine (COMPAZINE) 10 MG tablet Take 1 tablet (10 mg total) by mouth every 6 (six) hours as needed for nausea or vomiting. 30 tablet 1   rosuvastatin (CRESTOR) 5 MG tablet Take 1 tablet (5 mg total) by mouth daily. (Patient not taking: Reported on 04/18/2022) 90 tablet 1   sucralfate (CARAFATE) 1 g tablet Take 1  tablet (1 g total) by mouth 2 (two) times daily. 60 tablet 6   Current Facility-Administered Medications  Medication Dose Route Frequency Provider Last Rate Last Admin   0.9 %  sodium chloride infusion  500 mL Intravenous Continuous Thornton Park, MD        PHYSICAL EXAMINATION: ECOG PERFORMANCE STATUS: {CHL ONC ECOG ST:[id number]}  There were no vitals filed for this visit. Wt Readings from Last 3 Encounters:  05/03/22 176 lb (79.8 kg)  04/13/22 180 lb (81.6 kg)  03/30/22 192 lb (87.1 kg)    {Only keep what was examined. If exam not performed, can use .CEXAM } GENERAL:alert, no distress and comfortable SKIN: skin color, texture, turgor are normal, no rashes or significant lesions EYES: normal, Conjunctiva are pink and non-injected, sclera clear {OROPHARYNX:no exudate, no erythema and lips, buccal mucosa, and tongue  normal}  NECK: supple, thyroid normal size, non-tender, without nodularity LYMPH:  no palpable lymphadenopathy in the cervical, axillary {or inguinal} LUNGS: clear to auscultation and percussion with normal breathing effort HEART: regular rate & rhythm and no murmurs and no lower extremity edema ABDOMEN:abdomen soft, non-tender and normal bowel sounds Musculoskeletal:no cyanosis of digits and no clubbing  NEURO: alert & oriented x 3 with fluent speech, no focal motor/sensory deficits  LABORATORY DATA:  I have reviewed the data as listed    Latest Ref Rng & Units 04/13/2022   12:11 PM 12/05/2021    6:37 PM 12/20/2020    9:57 AM  CBC  WBC 4.0 - 10.5 K/uL 4.0  5.2  4.1   Hemoglobin 12.0 - 15.0 g/dL 14.5  13.7  14.4   Hematocrit 36.0 - 46.0 % 42.0  38.2  40.7   Platelets 150 - 400 K/uL 288  313  313         Latest Ref Rng & Units 04/13/2022   12:11 PM 01/23/2022    4:33 PM 12/05/2021    6:37 PM  CMP  Glucose 70 - 99 mg/dL 96  98  128   BUN 6 - 20 mg/dL 11  20  17    Creatinine 0.44 - 1.00 mg/dL 0.76  0.88  0.79   Sodium 135 - 145 mmol/L 140  135  134   Potassium 3.5 - 5.1 mmol/L 3.8  3.8  3.3   Chloride 98 - 111 mmol/L 104  99  99   CO2 22 - 32 mmol/L 32  28  26   Calcium 8.9 - 10.3 mg/dL 10.4  9.9  9.2   Total Protein 6.5 - 8.1 g/dL 7.3  7.7  7.5   Total Bilirubin 0.3 - 1.2 mg/dL 0.8  0.6  1.0   Alkaline Phos 38 - 126 U/L 110  73  80   AST 15 - 41 U/L 25  29  50   ALT 0 - 44 U/L 24  29  49       RADIOGRAPHIC STUDIES: I have personally reviewed the radiological images as listed and agreed with the findings in the report. No results found.    No orders of the defined types were placed in this encounter.  All questions were answered. The patient knows to call the clinic with any problems, questions or concerns. No barriers to learning was detected. The total time spent in the appointment was {CHL ONC TIME VISIT - [id number]}.     Baldemar Friday, CMA 05/08/2022    I, Audry Riles, CMA, am acting as scribe for Truitt Merle, MD.   Aris [place]  scribe attestation statement}

## 2022-05-09 ENCOUNTER — Inpatient Hospital Stay: Payer: Commercial Managed Care - HMO

## 2022-05-09 ENCOUNTER — Encounter: Payer: Self-pay | Admitting: Hematology

## 2022-05-09 ENCOUNTER — Inpatient Hospital Stay (HOSPITAL_BASED_OUTPATIENT_CLINIC_OR_DEPARTMENT_OTHER): Payer: Commercial Managed Care - HMO | Admitting: Hematology

## 2022-05-09 VITALS — BP 141/86 | HR 81 | Temp 98.8°F | Resp 16

## 2022-05-09 VITALS — BP 136/95 | HR 70 | Temp 100.1°F | Resp 18 | Ht 68.0 in | Wt 179.3 lb

## 2022-05-09 DIAGNOSIS — Z5189 Encounter for other specified aftercare: Secondary | ICD-10-CM | POA: Diagnosis not present

## 2022-05-09 DIAGNOSIS — C162 Malignant neoplasm of body of stomach: Secondary | ICD-10-CM | POA: Diagnosis not present

## 2022-05-09 DIAGNOSIS — Z5111 Encounter for antineoplastic chemotherapy: Secondary | ICD-10-CM | POA: Diagnosis not present

## 2022-05-09 DIAGNOSIS — C169 Malignant neoplasm of stomach, unspecified: Secondary | ICD-10-CM | POA: Diagnosis not present

## 2022-05-09 DIAGNOSIS — C786 Secondary malignant neoplasm of retroperitoneum and peritoneum: Secondary | ICD-10-CM | POA: Diagnosis not present

## 2022-05-09 DIAGNOSIS — Z95828 Presence of other vascular implants and grafts: Secondary | ICD-10-CM

## 2022-05-09 LAB — CBC WITH DIFFERENTIAL (CANCER CENTER ONLY)
Abs Immature Granulocytes: 0.01 10*3/uL (ref 0.00–0.07)
Basophils Absolute: 0 10*3/uL (ref 0.0–0.1)
Basophils Relative: 1 %
Eosinophils Absolute: 0.1 10*3/uL (ref 0.0–0.5)
Eosinophils Relative: 3 %
HCT: 39.3 % (ref 36.0–46.0)
Hemoglobin: 13.9 g/dL (ref 12.0–15.0)
Immature Granulocytes: 0 %
Lymphocytes Relative: 23 %
Lymphs Abs: 1 10*3/uL (ref 0.7–4.0)
MCH: 29.1 pg (ref 26.0–34.0)
MCHC: 35.4 g/dL (ref 30.0–36.0)
MCV: 82.4 fL (ref 80.0–100.0)
Monocytes Absolute: 0.4 10*3/uL (ref 0.1–1.0)
Monocytes Relative: 10 %
Neutro Abs: 2.7 10*3/uL (ref 1.7–7.7)
Neutrophils Relative %: 63 %
Platelet Count: 316 10*3/uL (ref 150–400)
RBC: 4.77 MIL/uL (ref 3.87–5.11)
RDW: 13.2 % (ref 11.5–15.5)
WBC Count: 4.2 10*3/uL (ref 4.0–10.5)
nRBC: 0 % (ref 0.0–0.2)

## 2022-05-09 LAB — CMP (CANCER CENTER ONLY)
ALT: 19 U/L (ref 0–44)
AST: 20 U/L (ref 15–41)
Albumin: 4.1 g/dL (ref 3.5–5.0)
Alkaline Phosphatase: 98 U/L (ref 38–126)
Anion gap: 5 (ref 5–15)
BUN: 14 mg/dL (ref 6–20)
CO2: 29 mmol/L (ref 22–32)
Calcium: 9.6 mg/dL (ref 8.9–10.3)
Chloride: 105 mmol/L (ref 98–111)
Creatinine: 0.69 mg/dL (ref 0.44–1.00)
GFR, Estimated: >60 mL/min (ref >60)
Glucose, Bld: 92 mg/dL (ref 70–99)
Potassium: 3.5 mmol/L (ref 3.5–5.1)
Sodium: 139 mmol/L (ref 135–145)
Total Bilirubin: 0.7 mg/dL (ref 0.3–1.2)
Total Protein: 7.1 g/dL (ref 6.5–8.1)

## 2022-05-09 MED ORDER — SODIUM CHLORIDE 0.9 % IV SOLN
2600.0000 mg/m2 | INTRAVENOUS | Status: DC
  Administered 2022-05-09: 5150 mg via INTRAVENOUS
  Filled 2022-05-09: qty 103

## 2022-05-09 MED ORDER — PALONOSETRON HCL INJECTION 0.25 MG/5ML
0.2500 mg | Freq: Once | INTRAVENOUS | Status: AC
  Administered 2022-05-09: 0.25 mg via INTRAVENOUS
  Filled 2022-05-09: qty 5

## 2022-05-09 MED ORDER — LEUCOVORIN CALCIUM INJECTION 350 MG
200.0000 mg/m2 | Freq: Once | INTRAVENOUS | Status: AC
  Administered 2022-05-09: 396 mg via INTRAVENOUS
  Filled 2022-05-09: qty 19.8

## 2022-05-09 MED ORDER — SODIUM CHLORIDE 0.9% FLUSH
10.0000 mL | INTRAVENOUS | Status: AC | PRN
  Administered 2022-05-09: 10 mL

## 2022-05-09 MED ORDER — SODIUM CHLORIDE 0.9 % IV SOLN
50.0000 mg/m2 | Freq: Once | INTRAVENOUS | Status: AC
  Administered 2022-05-09: 100 mg via INTRAVENOUS
  Filled 2022-05-09: qty 10

## 2022-05-09 MED ORDER — SODIUM CHLORIDE 0.9 % IV SOLN
10.0000 mg | Freq: Once | INTRAVENOUS | Status: AC
  Administered 2022-05-09: 10 mg via INTRAVENOUS
  Filled 2022-05-09: qty 1
  Filled 2022-05-09: qty 10

## 2022-05-09 MED ORDER — DEXTROSE 5 % IV SOLN: Freq: Once | INTRAVENOUS | Status: AC

## 2022-05-09 MED ORDER — OXALIPLATIN CHEMO INJECTION 100 MG/20ML
85.0000 mg/m2 | Freq: Once | INTRAVENOUS | Status: AC
  Administered 2022-05-09: 170 mg via INTRAVENOUS
  Filled 2022-05-09: qty 34

## 2022-05-09 NOTE — Patient Instructions (Signed)
[facility]  Discharge Instructions: Thank you for choosing [facility] to provide your oncology and hematology care.   If you have a lab appointment with the [facility], please go directly to the [facility] and check in at the registration area.   Wear comfortable clothing and clothing appropriate for easy access to any Portacath or PICC line.   We strive to give you quality time with your provider. You may need to reschedule your appointment if you arrive late (15 or more minutes).  Arriving late affects you and other patients whose appointments are after yours.  Also, if you miss three or more appointments without notifying the office, you may be dismissed from the clinic at the provider's discretion.      For prescription refill requests, have your pharmacy contact our office and allow 72 hours for refills to be completed.    Today you received the following chemotherapy and/or immunotherapy agents: docetaxel, oxaliplatin, leucovorin, fluorouracil      To help prevent nausea and vomiting after your treatment, we encourage you to take your nausea medication as directed.  BELOW ARE SYMPTOMS THAT SHOULD BE REPORTED IMMEDIATELY: *FEVER GREATER THAN 100.4 F (38 C) OR HIGHER *CHILLS OR SWEATING *NAUSEA AND VOMITING THAT IS NOT CONTROLLED WITH YOUR NAUSEA MEDICATION *UNUSUAL SHORTNESS OF BREATH *UNUSUAL BRUISING OR BLEEDING *URINARY PROBLEMS (pain or burning when urinating, or frequent urination) *BOWEL PROBLEMS (unusual diarrhea, constipation, pain near the anus) TENDERNESS IN MOUTH AND THROAT WITH OR WITHOUT PRESENCE OF ULCERS (sore throat, sores in mouth, or a toothache) UNUSUAL RASH, SWELLING OR PAIN  UNUSUAL VAGINAL DISCHARGE OR ITCHING   Items with * indicate a potential emergency and should be followed up as soon as possible or go to the Emergency Department if any problems should occur.  Please show the CHEMOTHERAPY ALERT CARD or  IMMUNOTHERAPY ALERT CARD at check-in to the Emergency Department and triage nurse.  Should you have questions after your visit or need to cancel or reschedule your appointment, please contact [facility]  Dept: [phone]  and follow the prompts.  Office hours are 8:00 a.m. to 4:30 p.m. Monday - Friday. Please note that voicemails left after 4:00 p.m. may not be returned until the following business day.  We are closed weekends and major holidays. You have access to a nurse at all times for urgent questions. Please call the main number to the clinic Dept: [phone] and follow the prompts.   For any non-urgent questions, you may also contact your provider using MyChart. We now offer e-Visits for anyone 85 and older to request care online for non-urgent symptoms. For details visit [email or web address].   Also download the MyChart app! Go to the app store, search "MyChart", open the app, select [facility], and log in with your MyChart username and password.

## 2022-05-09 NOTE — Addendum Note (Signed)
Addended by: Tora Kindred on: 05/09/2022 03:45 PM   Modules accepted: Orders

## 2022-05-10 ENCOUNTER — Inpatient Hospital Stay: Payer: Commercial Managed Care - HMO

## 2022-05-10 ENCOUNTER — Telehealth: Payer: Self-pay

## 2022-05-10 VITALS — BP 130/89 | HR 77 | Temp 98.9°F | Resp 18

## 2022-05-10 DIAGNOSIS — Z5111 Encounter for antineoplastic chemotherapy: Secondary | ICD-10-CM | POA: Diagnosis not present

## 2022-05-10 DIAGNOSIS — C162 Malignant neoplasm of body of stomach: Secondary | ICD-10-CM

## 2022-05-10 MED ORDER — PEGFILGRASTIM INJECTION 6 MG/0.6ML ~~LOC~~
6.0000 mg | PREFILLED_SYRINGE | Freq: Once | SUBCUTANEOUS | Status: AC
  Administered 2022-05-10: 6 mg via SUBCUTANEOUS
  Filled 2022-05-10: qty 0.6

## 2022-05-10 MED ORDER — SODIUM CHLORIDE 0.9% FLUSH
10.0000 mL | INTRAVENOUS | Status: DC | PRN
  Administered 2022-05-10: 10 mL

## 2022-05-10 MED ORDER — HEPARIN SOD (PORK) LOCK FLUSH 100 UNIT/ML IV SOLN
500.0000 [IU] | Freq: Once | INTRAVENOUS | Status: AC | PRN
  Administered 2022-05-10: 500 [IU]

## 2022-05-10 NOTE — Addendum Note (Signed)
Addended by: Truitt Merle on: 05/10/2022 03:34 PM   Modules accepted: Orders

## 2022-05-10 NOTE — Telephone Encounter (Signed)
LM for patient that this nurse was calling to see how they were doing after their treatment. Suggested in the message for patient to sip on a hot drink when she comes in to have her pump dc' d to help avoid the cold sensitivity in her throat with the cold weather and wear gloves. Please call back to Dr. Ernestina Penna nurse at [phone] if they have any questions or concerns regarding the treatment.

## 2022-05-10 NOTE — Telephone Encounter (Signed)
-----   Message from Clyda Hurdle, RN sent at 05/09/2022  3:31 PM EST ----- Regarding: 1st Time Taxotere/Oxaliplatin/5FU- Dr Burr Medico 1st Time Taxotere/oxaliplatin/5FU Dr Ernestina Penna patient. No issues during treatment. Pump D/C on 05/10/22

## 2022-05-11 ENCOUNTER — Ambulatory Visit: Payer: Commercial Managed Care - HMO

## 2022-05-16 ENCOUNTER — Other Ambulatory Visit: Payer: Self-pay

## 2022-05-16 NOTE — Progress Notes (Signed)
Authorization for Pegfilgrastim 6mg  SubQ injection to be administered in clinic has been extended to 10/29/2022.  Authorization# 12/30/2022.  Spoke with [employer] [id number] today.  Production designer, theatre/television/film in [place] received fax confirmation of authorization extension.

## 2022-05-17 NOTE — Progress Notes (Signed)
I spoke with Sheryl [name] and reviewed PET scan appt date, time, location, and instructions.  All questions were answered.  She verbalized understanding.

## 2022-05-18 ENCOUNTER — Encounter: Payer: Self-pay | Admitting: Hematology

## 2022-05-21 ENCOUNTER — Encounter: Payer: Self-pay | Admitting: Hematology

## 2022-05-21 ENCOUNTER — Ambulatory Visit: Payer: Self-pay | Admitting: Genetic Counselor

## 2022-05-21 ENCOUNTER — Telehealth: Payer: Self-pay | Admitting: Genetic Counselor

## 2022-05-21 ENCOUNTER — Inpatient Hospital Stay: Payer: Commercial Managed Care - HMO | Admitting: Licensed Clinical Social Worker

## 2022-05-21 ENCOUNTER — Encounter: Payer: Self-pay | Admitting: Genetic Counselor

## 2022-05-21 ENCOUNTER — Other Ambulatory Visit: Payer: Self-pay

## 2022-05-21 ENCOUNTER — Other Ambulatory Visit (HOSPITAL_COMMUNITY): Payer: Self-pay

## 2022-05-21 DIAGNOSIS — Z1379 Encounter for other screening for genetic and chromosomal anomalies: Secondary | ICD-10-CM | POA: Insufficient documentation

## 2022-05-21 DIAGNOSIS — C162 Malignant neoplasm of body of stomach: Secondary | ICD-10-CM

## 2022-05-21 NOTE — Progress Notes (Signed)
HPI:  Sheryl [name] was previously seen in the [facility] due to a personal and family history of cancer and concerns regarding a hereditary predisposition to cancer. Please refer to our prior cancer genetics clinic note for more information regarding our discussion, assessment and recommendations, at the time. Sheryl [name] recent genetic test results were disclosed to her, as were recommendations warranted by these results. These results and recommendations are discussed in more detail below.  CANCER HISTORY:  Oncology History  Gastric cancer (HCC)  03/30/2022 Procedure   EGD:  Impression:  - Normal esophagus. - A few gastric polyps. Biopsied. - Gastritis. Biopsied. - Non-bleeding gastric ulcer with no stigmata of bleeding. Biopsied. - Normal examined duodenum. Biopsied.  Findings: Diffuse moderate inflammation characterized by congestion (edema), friability and granularity was found in the cardia, in the gastric fundus and in the gastric body. There were associated erosions in multiple places. Biopsies were taken from the antrum, body, and fundus with a cold forceps for histology. Estimated blood loss was minimal.  One non-bleeding cratered gastric ulcer with no stigmata of bleeding was found on the greater curvature of the stomach. The lesion was 6 mm in largest dimension. The mucosa around the ulcer was heaped and led to some deformity in the antrum. Biopsies were taken with a cold forceps for histology. Estimated blood loss was minimal.    03/30/2022 Pathology Results   [name], Sheryl [name]  Accession: [id number]  Diagnosis 1. Surgical [P], duodenal - BENIGN SMALL BOWEL MUCOSA WITH NO SIGNIFICANT PATHOLOGIC CHANGES 2. Surgical [P], gastric antrum - GASTRIC ANTRAL MUCOSA WITH FEATURES OF REACTIVE GASTROPATHY - NEGATIVE FOR H. PYLORI ON H&E STAIN - NEGATIVE FOR INTESTINAL METAPLASIA OR MALIGNANCY 3. Surgical [P], gastric body - GASTRIC OXYNTIC  MUCOSA WITH REACTIVE/REPARATIVE CHANGES - NEGATIVE FOR H. PYLORI ON H&E STAIN - NEGATIVE FOR INTESTINAL METAPLASIA, DYSPLASIA OR MALIGNANCY 4. Surgical [P], greater curve ulceration - ADENOCARCINOMA WITH SIGNET RING CELL FEATURES (SEE NOTE) 5. Surgical [P], gastric polyps - ADENOCARCINOMA WITH SIGNET RING CELL FEATURES (SEE NOTE) 6. Surgical [P], fundus (gastric) - ADENOCARCINOMA WITH SIGNET RING CELL FEATURES (SEE NOTE) 7. Surgical [P], colon, ascending, polyp (1) - TUBULAR ADENOMA. - NO HIGH GRADE DYSPLASIA OR MALIGNANCY. 8. Surgical [P], colon, transverse, polyp (1) - TUBULAR ADENOMA. - NO HIGH GRADE DYSPLASIA OR MALIGNANCY.    04/13/2022 Initial Diagnosis   Gastric cancer (HCC)   04/19/2022 Cancer Staging   Staging form: Stomach, AJCC 8th Edition - Clinical stage from 04/19/2022: Stage IVB (cT2, cN0, pM1) - Signed by [name], MD on 05/08/2022 Total positive nodes: 0   05/05/2022 Genetic Testing   Negative genetic testing on the Multi-cancer gene panel + RNA.  FH c.259C>T VUS identified.  The report date is May 05, 2022.  The Multi-Cancer + RNA Panel offered by [facility] includes sequencing and/or deletion/duplication analysis of the following 70 genes:  AIP*, ALK, APC*, ATM*, AXIN2*, BAP1*, BARD1*, BLM*, BMPR1A*, BRCA1*, BRCA2*, BRIP1*, CDC73*, CDH1*, CDK4, CDKN1B*, CDKN2A, CHEK2*, CTNNA1*, DICER1*, EPCAM (del/dup only), EGFR, FH*, FLCN*, GREM1 (promoter dup only), HOXB13, KIT, LZTR1, MAX*, MBD4, MEN1*, MET, MITF, MLH1*, MSH2*, MSH3*, MSH6*, MUTYH*, NF1*, NF2*, NTHL1*, PALB2*, PDGFRA, PMS2*, POLD1*, POLE*, POT1*, PRKAR1A*, PTCH1*, PTEN*, RAD51C*, RAD51D*, RB1*, RET, SDHA* (sequencing only), SDHAF2*, SDHB*, SDHC*, SDHD*, SMAD4*, SMARCA4*, SMARCB1*, SMARCE1*, STK11*, SUFU*, TMEM127*, TP53*, TSC1*, TSC2*, VHL*. RNA analysis is performed for * genes.    05/09/2022 -  Chemotherapy   Patient is on Treatment Plan : GASTROESOPHAGEAL FLOT q14d X  4 cycles       FAMILY HISTORY:  We  obtained a detailed, 4-generation family history.  Significant diagnoses are listed below: Family History  Problem Relation Age of Onset   Stroke Mother    Diabetes Mother    Hypertension Mother    Multiple myeloma Mother    Stroke Father    Pancreatic cancer Maternal Aunt    Stomach cancer Maternal Uncle    Breast cancer Paternal Aunt    Stomach cancer Paternal Aunt    Stomach cancer Paternal Uncle    Heart attack Maternal Grandmother    Breast cancer Paternal Grandmother    Diabetes Other    Hypertension Other    Stroke Other    Cancer Other    Heart attack Other    Esophageal cancer Neg Hx    Liver disease Neg Hx    Colon cancer Neg Hx    Rectal cancer Neg Hx        The patient has a son and daughter who are cancer free.  She has a full sister who is cancer free.  There are two paternal half brothers and two maternal half brothers and a sister who are all cancer free.  Both parents are living.   The patient's mother has multiple myeloma.  She has two brothers who had stomach cancer a sister who had pancreatic cancer and six other sisters who did not have cancer.  The maternal grandparents are deceased from non-cancer related issues.   The patient's father is cancer free.  He has a brother and a sister who had stomach cancer, a sister who had breast cancer and six other siblings who were cancer free. His mother had breast cancer.   Sheryl [name] is unaware of previous family history of genetic testing for hereditary cancer risks. Patient's maternal ancestors are of [place] and [place] descent, and paternal ancestors are of African American descent. There is no reported Ashkenazi Jewish ancestry. There is no known consanguinity  GENETIC TEST RESULTS: Genetic testing reported out on May 05, 2022 through the Multi-cancer+RNA panel found no pathogenic mutations. The Multi-Cancer + RNA Panel offered by [facility] includes sequencing and/or deletion/duplication analysis  of the following 70 genes:  AIP*, ALK, APC*, ATM*, AXIN2*, BAP1*, BARD1*, BLM*, BMPR1A*, BRCA1*, BRCA2*, BRIP1*, CDC73*, CDH1*, CDK4, CDKN1B*, CDKN2A, CHEK2*, CTNNA1*, DICER1*, EPCAM (del/dup only), EGFR, FH*, FLCN*, GREM1 (promoter dup only), HOXB13, KIT, LZTR1, MAX*, MBD4, MEN1*, MET, MITF, MLH1*, MSH2*, MSH3*, MSH6*, MUTYH*, NF1*, NF2*, NTHL1*, PALB2*, PDGFRA, PMS2*, POLD1*, POLE*, POT1*, PRKAR1A*, PTCH1*, PTEN*, RAD51C*, RAD51D*, RB1*, RET, SDHA* (sequencing only), SDHAF2*, SDHB*, SDHC*, SDHD*, SMAD4*, SMARCA4*, SMARCB1*, SMARCE1*, STK11*, SUFU*, TMEM127*, TP53*, TSC1*, TSC2*, VHL*. RNA analysis is performed for * genes. The test report has been scanned into EPIC and is located under the Molecular Pathology section of the Results Review tab.  A portion of the result report is included below for reference.     We discussed with Sheryl [name] that because current genetic testing is not perfect, it is possible there may be a gene mutation in one of these genes that current testing cannot detect, but that chance is small.  We also discussed, that there could be another gene that has not yet been discovered, or that we have not yet tested, that is responsible for the cancer diagnoses in the family. It is also possible there is a hereditary cause for the cancer in the family that Sheryl [name] did not inherit and therefore was not identified  in her testing.  Therefore, it is important to remain in touch with cancer genetics in the future so that we can continue to offer Sheryl [name] the most up to date genetic testing.   Genetic testing did identify a variant of uncertain significance (VUS) was identified in the FH gene called c.259C>T.  At this time, it is unknown if this variant is associated with increased cancer risk or if this is a normal finding, but most variants such as this get reclassified to being inconsequential. It should not be used to make medical management decisions. With time, we suspect the lab  will determine the significance of this variant, if any. If we do learn more about it, we will try to contact Sheryl [name] to discuss it further. However, it is important to stay in touch with [place] periodically and keep the address and phone number up to date.  ADDITIONAL GENETIC TESTING: We discussed with Sheryl [name] that her genetic testing was fairly extensive.  If there are genes identified to increase cancer risk that can be analyzed in the future, we would be happy to discuss and coordinate this testing at that time.    CANCER SCREENING RECOMMENDATIONS: Sheryl [name] test result is considered negative (normal).  This means that we have not identified a hereditary cause for her personal and family history of cancer at this time. Most cancers happen by chance and this negative test suggests that her cancer may fall into this category.    While reassuring, this does not definitively rule out a hereditary predisposition to cancer. It is still possible that there could be genetic mutations that are undetectable by current technology. There could be genetic mutations in genes that have not been tested or identified to increase cancer risk.  Therefore, it is recommended she continue to follow the cancer management and screening guidelines provided by her oncology and primary healthcare provider.   An individual's cancer risk and medical management are not determined by genetic test results alone. Overall cancer risk assessment incorporates additional factors, including personal medical history, family history, and any available genetic information that may result in a personalized plan for cancer prevention and surveillance   RECOMMENDATIONS FOR FAMILY MEMBERS:  Individuals in this family might be at some increased risk of developing cancer, over the general population risk, simply due to the family history of cancer.  We recommended women in this family have a yearly mammogram beginning at age 72, or 7 years  younger than the earliest onset of cancer, an annual clinical breast exam, and perform monthly breast self-exams. Women in this family should also have a gynecological exam as recommended by their primary provider. All family members should be referred for colonoscopy starting at age 5.  FOLLOW-UP: Lastly, we discussed with Sheryl [name] that cancer genetics is a rapidly advancing field and it is possible that new genetic tests will be appropriate for her and/or her family members in the future. We encouraged her to remain in contact with cancer genetics on an annual basis so we can update her personal and family histories and let her know of advances in cancer genetics that may benefit this family.   Our contact number was provided. Sheryl [name] questions were answered to her satisfaction, and she knows she is welcome to call us at anytime with additional questions or concerns.   Maylon Cos, MS, [facility] Licensed, Certified Genetic Counselor [email or web address]

## 2022-05-21 NOTE — Telephone Encounter (Signed)
Revealed negative genetic testing.  Discussed that we do not know why she has stomach cancer or why there is cancer in the family. It could be due to a different gene that we are not testing, or maybe our current technology may not be able to pick something up.  It will be important for her to keep in contact with genetics to keep up with whether additional testing may be needed.  VUS in FH.  This will not change medical management.

## 2022-05-21 NOTE — Progress Notes (Signed)
[facility] CSW Progress Note  Clinical Child psychotherapist  received a voice message from pt requesting information about supportive services.  CSW attempted to call pt back w/ no answer.  A voicemail was left w/ contact details to reach CSW.  CSW also emailed a flyer outlining [facility] supportive services to the email listed in pt's chart.  CSW to follow up w/ pt on 1/25 upon return to the office.        Rachel Moulds, LCSW

## 2022-05-22 MED FILL — Dexamethasone Sodium Phosphate Inj 100 MG/10ML: INTRAMUSCULAR | Qty: 1 | Status: AC

## 2022-05-22 NOTE — Assessment & Plan Note (Signed)
-  [id number] with peritoneal metastasis.  -Diagnosed in 03/2022, initial CT scan was negative for metastasis, however exploratory laparoscope showed peritoneal metastasis.   -she has started first line chemo FLOT on 1/10 -I have requested HER2, MMR and FO on her biopsy, results are still pending. PD-L1 0-1%

## 2022-05-22 NOTE — Progress Notes (Unsigned)
[place]   Telephone:([phone] Fax:([phone]   Clinic Follow up Note   Patient Care Team: Janith Lima, MD as PCP - General (Internal Medicine) Truitt Merle, MD as Consulting Physician (Oncology)  Date of Service:  05/23/2022  CHIEF COMPLAINT: f/u of Gastric Cancer   CURRENT THERAPY:  FLOT q14d   ASSESSMENT:  Sheryl [name] is a 57 y.o. 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  -Diagnosed in 03/2022, initial CT scan was negative for metastasis, however exploratory laparoscope showed peritoneal metastasis.   -she has started first line chemo FLOT on 1/10 -I have requested HER2, MMR and FO on her biopsy, results are still pending. PD-L1 0-1%, no significant benefit from PD-L1 immunotherapy, I reviewed with patient and her husband. -She tolerated first cycle very well, lab reviewed, adequate for treatment, will proceed with cycle 2 today. -She is scheduled for PET scan next week.    PLAN: - lab reviewed. -proceed with C2 FLOT lab adequate for treatment -PET scan on 05/30/2022 -lab,flush,f/u and FLOT 06/06/2022  SUMMARY OF ONCOLOGIC HISTORY: Oncology History  Gastric cancer ([place])  03/30/2022 Procedure   EGD:  Impression:  - Normal esophagus. - A few gastric polyps. Biopsied. - Gastritis. Biopsied. - Non-bleeding gastric ulcer with no stigmata of bleeding. Biopsied. - Normal examined duodenum. Biopsied.  Findings: Diffuse moderate inflammation characterized by congestion (edema), friability and granularity was found in the cardia, in the gastric fundus and in the gastric body. There were associated erosions in multiple places. Biopsies were taken from the antrum, body, and fundus with a cold forceps for histology. Estimated blood loss was minimal.  One non-bleeding cratered gastric ulcer with no stigmata of bleeding was found on the greater curvature of the stomach. The lesion was 6 mm in largest dimension. The  mucosa around the ulcer was heaped and led to some deformity in the antrum. Biopsies were taken with a cold forceps for histology. Estimated blood loss was minimal.    03/30/2022 Pathology Results   [name], Sheryl [name]  Accession: [id number]  Diagnosis 1. Surgical [P], duodenal - BENIGN SMALL BOWEL MUCOSA WITH NO SIGNIFICANT PATHOLOGIC CHANGES 2. Surgical [P], gastric antrum - GASTRIC ANTRAL MUCOSA WITH FEATURES OF REACTIVE GASTROPATHY - NEGATIVE FOR H. PYLORI ON H&E STAIN - NEGATIVE FOR INTESTINAL METAPLASIA OR MALIGNANCY 3. Surgical [P], gastric body - GASTRIC OXYNTIC MUCOSA WITH REACTIVE/REPARATIVE CHANGES - NEGATIVE FOR H. PYLORI ON H&E STAIN - NEGATIVE FOR INTESTINAL METAPLASIA, DYSPLASIA OR MALIGNANCY 4. Surgical [P], greater curve ulceration - ADENOCARCINOMA WITH SIGNET RING CELL FEATURES (SEE NOTE) 5. Surgical [P], gastric polyps - ADENOCARCINOMA WITH SIGNET RING CELL FEATURES (SEE NOTE) 6. Surgical [P], fundus (gastric) - ADENOCARCINOMA WITH SIGNET RING CELL FEATURES (SEE NOTE) 7. Surgical [P], colon, ascending, polyp (1) - TUBULAR ADENOMA. - NO HIGH GRADE DYSPLASIA OR MALIGNANCY. 8. Surgical [P], colon, transverse, polyp (1) - TUBULAR ADENOMA. - NO HIGH GRADE DYSPLASIA OR MALIGNANCY.    04/13/2022 Initial Diagnosis   Gastric cancer ([place])   04/19/2022 Cancer Staging   Staging form: Stomach, AJCC 8th Edition - Clinical stage from 04/19/2022: Stage IVB (cT2, cN0, pM1) - Signed by Truitt Merle, MD on 05/08/2022 Total positive nodes: 0   05/05/2022 Genetic Testing   Negative genetic testing on the Multi-cancer gene panel + RNA.  FH c.259C>T VUS identified.  The report date is May 05, 2022.  The Multi-Cancer + RNA Panel offered by [facility] includes sequencing and/or deletion/duplication analysis of the  following 70 genes:  AIP*, ALK, APC*, ATM*, AXIN2*, BAP1*, BARD1*, BLM*, BMPR1A*, BRCA1*, BRCA2*, BRIP1*, CDC73*, CDH1*, CDK4, CDKN1B*, CDKN2A, CHEK2*, CTNNA1*,  DICER1*, EPCAM (del/dup only), EGFR, FH*, FLCN*, GREM1 (promoter dup only), HOXB13, KIT, LZTR1, MAX*, MBD4, MEN1*, MET, MITF, MLH1*, MSH2*, MSH3*, MSH6*, MUTYH*, NF1*, NF2*, NTHL1*, PALB2*, PDGFRA, PMS2*, POLD1*, POLE*, POT1*, PRKAR1A*, PTCH1*, PTEN*, RAD51C*, RAD51D*, RB1*, RET, SDHA* (sequencing only), SDHAF2*, SDHB*, SDHC*, SDHD*, SMAD4*, SMARCA4*, SMARCB1*, SMARCE1*, STK11*, SUFU*, TMEM127*, TP53*, TSC1*, TSC2*, VHL*. RNA analysis is performed for * genes.    05/09/2022 -  Chemotherapy   Patient is on Treatment Plan : GASTROESOPHAGEAL FLOT q14d X 4 cycles        INTERVAL HISTORY:  Sheryl [name] is here for a follow up of  Gastric Cancer She was last seen by me on 05/09/2022 She presents to the clinic accompanied by family member. Pt states she is doing well after the first treatment. Pt states that she had some nausea and vomiting the dat after receiving treatment.      All other systems were reviewed with the patient and are negative.  MEDICAL HISTORY:  Past Medical History:  Diagnosis Date   Blood transfusion without reported diagnosis    had transfusion with hysterectomy   Cataract    Colon polyps 2012   Diabetes ([place]) 03/13/2021   Diabetes ([place]) 05/21/2019   Family history of breast cancer    Family history of pancreatic cancer    Family history of stomach cancer    Fibroid    GERD (gastroesophageal reflux disease)    H/O blood clots    History of hysterectomy    fibroids and heavy cycles   Hypertension     SURGICAL HISTORY: Past Surgical History:  Procedure Laterality Date   ABDOMINAL HYSTERECTOMY     BIOPSY  04/19/2022   Procedure: BIOPSY;  Surgeon: Irving Copas., MD;  Location: [facility];  Service: Gastroenterology;;   COLONOSCOPY     ESOPHAGOGASTRODUODENOSCOPY (EGD) WITH PROPOFOL N/A 04/19/2022   Procedure: ESOPHAGOGASTRODUODENOSCOPY (EGD) WITH PROPOFOL;  Surgeon: Irving Copas., MD;  Location: [facility];  Service: Gastroenterology;   Laterality: N/A;   EUS N/A 04/19/2022   Procedure: UPPER ENDOSCOPIC ULTRASOUND (EUS) RADIAL;  Surgeon: Irving Copas., MD;  Location: [facility];  Service: Gastroenterology;  Laterality: N/A;   EXCISION OF SKIN TAG  05/03/2022   Procedure: EXCISION OF CHEST WALL SKIN LESION;  Surgeon: Dwan Bolt, MD;  Location: [facility];  Service: General;;   LAPAROSCOPY N/A 05/03/2022   Procedure: LAPAROSCOPY DIAGNOSTIC WITH PERITONEAL WASHINGS;  Surgeon: Dwan Bolt, MD;  Location: [facility];  Service: General;  Laterality: N/A;   POLYPECTOMY  04/19/2022   Procedure: POLYPECTOMY;  Surgeon: Irving Copas., MD;  Location: [facility];  Service: Gastroenterology;;   PORTACATH PLACEMENT N/A 05/03/2022   Procedure: INSERTION PORT-A-CATH WITH ULTRASOUND GUIDANCE;  Surgeon: Dwan Bolt, MD;  Location: [facility];  Service: General;  Laterality: N/A;   UPPER GASTROINTESTINAL ENDOSCOPY      I have reviewed the social history and family history with the patient and they are unchanged from previous note.  ALLERGIES:  is allergic to aspirin, cyclobenzaprine, naproxen sodium, zithromax [azithromycin dihydrate], and dilaudid [hydromorphone].  MEDICATIONS:  Current Outpatient Medications  Medication Sig Dispense Refill   acetaminophen (TYLENOL) 500 MG tablet Take 1,000 mg by mouth every 8 (eight) hours as needed (pain).     amLODipine (NORVASC) 10 MG tablet TAKE 1 TABLET (10 MG TOTAL) BY MOUTH DAILY.  90 tablet 1   Bacillus Coagulans-Inulin (PROBIOTIC-PREBIOTIC) 1-250 BILLION-MG CAPS Take 2 capsules by mouth daily.     Biotin 1000 MCG CHEW Chew 1,000 mcg by mouth daily.     Blood Glucose Monitoring Suppl (TRUE METRIX METER) w/Device KIT 1 kit by Does not apply route 3 (three) times daily as needed. 1 kit 0   Cholecalciferol (VITAMIN D3) 125 MCG (5000 UT) TABS Take 5,000 Units by mouth daily.     lidocaine-prilocaine (EMLA) cream Apply to affected area once as directed. 30 g 3   ondansetron (ZOFRAN) 8 MG  tablet Take 1 tablet (8 mg total) by mouth every 8 (eight) hours as needed for nausea or vomiting. Start on the third day after chemotherapy. 30 tablet 1   ondansetron (ZOFRAN-ODT) 4 MG disintegrating tablet Take 1 tablet (4 mg total) by mouth every 6 (six) hours. Dissolve one tab on tongue 30 minutes before each dose of bowel prep (Patient not taking: Reported on 04/18/2022) 2 tablet 0   pantoprazole (PROTONIX) 40 MG tablet Take 1 tablet (40 mg total) by mouth 2 (two) times daily. 180 tablet 0   prochlorperazine (COMPAZINE) 10 MG tablet Take 1 tablet (10 mg total) by mouth every 6 (six) hours as needed for nausea or vomiting. 30 tablet 1   rosuvastatin (CRESTOR) 5 MG tablet Take 1 tablet (5 mg total) by mouth daily. (Patient not taking: Reported on 04/18/2022) 90 tablet 1   sucralfate (CARAFATE) 1 g tablet Take 1 tablet (1 g total) by mouth 2 (two) times daily. 60 tablet 6   Current Facility-Administered Medications  Medication Dose Route Frequency Provider Last Rate Last Admin   0.9 %  sodium chloride infusion  500 mL Intravenous Continuous [place] Park, MD       Facility-Administered Medications Ordered in Other Visits  Medication Dose Route Frequency Provider Last Rate Last Admin   0.9 %  sodium chloride infusion   Intravenous Continuous Truitt Merle, MD 20 mL/hr at 05/23/22 0913 New Bag at 05/23/22 0913   dextrose 5 % solution   Intravenous Once Truitt Merle, MD       DOCEtaxel (TAXOTERE) 100 mg in sodium chloride 0.9 % 250 mL chemo infusion  50 mg/m2 (Treatment Plan Recorded) Intravenous Once Truitt Merle, MD       fluorouracil (ADRUCIL) 5,000 mg in sodium chloride 0.9 % 150 mL chemo infusion  2,525 mg/m2 (Treatment Plan Recorded) Intravenous 1 day or 1 dose Truitt Merle, MD       leucovorin 396 mg in dextrose 5 % 250 mL infusion  200 mg/m2 (Treatment Plan Recorded) Intravenous Once Truitt Merle, MD       oxaliplatin (ELOXATIN) 170 mg in dextrose 5 % 500 mL chemo infusion  85 mg/m2 (Treatment Plan  Recorded) Intravenous Once Truitt Merle, MD        PHYSICAL EXAMINATION: ECOG PERFORMANCE STATUS: 0 - Asymptomatic  Vitals:   05/23/22 0807  BP: 126/86  Pulse: 70  Resp: 15  Temp: 98.4 F (36.9 C)  SpO2: 99%   Wt Readings from Last 3 Encounters:  05/23/22 181 lb (82.1 kg)  05/09/22 179 lb 4.8 oz (81.3 kg)  05/03/22 176 lb (79.8 kg)     GENERAL:alert, no distress and comfortable SKIN: skin color normal, no rashes or significant lesions EYES: normal, Conjunctiva are pink and non-injected, sclera clear  NEURO: alert & oriented x 3 with fluent speech  LABORATORY DATA:  I have reviewed the data as listed    Latest Ref  Rng & Units 05/23/2022    7:42 AM 05/09/2022    8:13 AM 04/13/2022   12:11 PM  CBC  WBC 4.0 - 10.5 K/uL 5.0  4.2  4.0   Hemoglobin 12.0 - 15.0 g/dL 12.7  13.9  14.5   Hematocrit 36.0 - 46.0 % 36.2  39.3  42.0   Platelets 150 - 400 K/uL 246  316  288         Latest Ref Rng & Units 05/23/2022    7:42 AM 05/09/2022    8:13 AM 04/13/2022   12:11 PM  CMP  Glucose 70 - 99 mg/dL 88  92  96   BUN 6 - 20 mg/dL _0 Creatinine 0.44 - 1.00 mg/dL 0.75  0.69  0.76   Sodium 135 - 145 mmol/L 140  139  140   Potassium 3.5 - 5.1 mmol/L 4.1  3.5  3.8   Chloride 98 - 111 mmol/L 105  105  104   CO2 22 - 32 mmol/L 31  29  32   Calcium 8.9 - 10.3 mg/dL 9.2  9.6  10.4   Total Protein 6.5 - 8.1 g/dL 6.8  7.1  7.3   Total Bilirubin 0.3 - 1.2 mg/dL 0.3  0.7  0.8   Alkaline Phos 38 - 126 U/L 100  98  110   AST 15 - 41 U/L _1 ALT 0 - 44 U/L _2 RADIOGRAPHIC STUDIES: I have personally reviewed the radiological images as listed and agreed with the findings in the report. No results found.    Orders Placed This Encounter  Procedures   CBC with Differential ([facility] Only)    Standing Status:   Future    Standing Expiration Date:   06/21/2023   CMP ([facility] only)    Standing Status:   Future    Standing Expiration Date:    06/21/2023   All questions were answered. The patient knows to call the clinic with any problems, questions or concerns. No barriers to learning was detected. The total time spent in the appointment was 30 minutes.     Truitt Merle, MD 05/23/2022   Felicity Coyer, CMA, am acting as scribe for Truitt Merle, MD.   I have reviewed the above documentation for accuracy and completeness, and I agree with the above.

## 2022-05-23 ENCOUNTER — Encounter: Payer: Self-pay | Admitting: Hematology

## 2022-05-23 ENCOUNTER — Inpatient Hospital Stay: Payer: Commercial Managed Care - HMO | Admitting: Dietician

## 2022-05-23 ENCOUNTER — Inpatient Hospital Stay (HOSPITAL_BASED_OUTPATIENT_CLINIC_OR_DEPARTMENT_OTHER): Payer: Commercial Managed Care - HMO | Admitting: Hematology

## 2022-05-23 ENCOUNTER — Inpatient Hospital Stay: Payer: Commercial Managed Care - HMO

## 2022-05-23 VITALS — BP 126/86 | HR 70 | Temp 98.4°F | Resp 15 | Ht 68.0 in | Wt 181.0 lb

## 2022-05-23 VITALS — BP 122/82 | HR 82 | Temp 98.6°F | Resp 16

## 2022-05-23 DIAGNOSIS — Z5111 Encounter for antineoplastic chemotherapy: Secondary | ICD-10-CM | POA: Diagnosis not present

## 2022-05-23 DIAGNOSIS — Z95828 Presence of other vascular implants and grafts: Secondary | ICD-10-CM | POA: Insufficient documentation

## 2022-05-23 DIAGNOSIS — C162 Malignant neoplasm of body of stomach: Secondary | ICD-10-CM

## 2022-05-23 LAB — CBC WITH DIFFERENTIAL (CANCER CENTER ONLY)
Abs Immature Granulocytes: 0.09 10*3/uL — ABNORMAL HIGH (ref 0.00–0.07)
Basophils Absolute: 0.1 10*3/uL (ref 0.0–0.1)
Basophils Relative: 1 %
Eosinophils Absolute: 0 10*3/uL (ref 0.0–0.5)
Eosinophils Relative: 1 %
HCT: 36.2 % (ref 36.0–46.0)
Hemoglobin: 12.7 g/dL (ref 12.0–15.0)
Immature Granulocytes: 2 %
Lymphocytes Relative: 16 %
Lymphs Abs: 0.8 10*3/uL (ref 0.7–4.0)
MCH: 29.3 pg (ref 26.0–34.0)
MCHC: 35.1 g/dL (ref 30.0–36.0)
MCV: 83.6 fL (ref 80.0–100.0)
Monocytes Absolute: 0.5 10*3/uL (ref 0.1–1.0)
Monocytes Relative: 10 %
Neutro Abs: 3.5 10*3/uL (ref 1.7–7.7)
Neutrophils Relative %: 70 %
Platelet Count: 246 10*3/uL (ref 150–400)
RBC: 4.33 MIL/uL (ref 3.87–5.11)
RDW: 14.3 % (ref 11.5–15.5)
WBC Count: 5 10*3/uL (ref 4.0–10.5)
nRBC: 0 % (ref 0.0–0.2)

## 2022-05-23 LAB — CMP (CANCER CENTER ONLY)
ALT: 18 U/L (ref 0–44)
AST: 19 U/L (ref 15–41)
Albumin: 3.9 g/dL (ref 3.5–5.0)
Alkaline Phosphatase: 100 U/L (ref 38–126)
Anion gap: 4 — ABNORMAL LOW (ref 5–15)
BUN: 13 mg/dL (ref 6–20)
CO2: 31 mmol/L (ref 22–32)
Calcium: 9.2 mg/dL (ref 8.9–10.3)
Chloride: 105 mmol/L (ref 98–111)
Creatinine: 0.75 mg/dL (ref 0.44–1.00)
GFR, Estimated: >60 mL/min (ref >60)
Glucose, Bld: 88 mg/dL (ref 70–99)
Potassium: 4.1 mmol/L (ref 3.5–5.1)
Sodium: 140 mmol/L (ref 135–145)
Total Bilirubin: 0.3 mg/dL (ref 0.3–1.2)
Total Protein: 6.8 g/dL (ref 6.5–8.1)

## 2022-05-23 MED ORDER — SODIUM CHLORIDE 0.9 % IV SOLN
50.0000 mg/m2 | Freq: Once | INTRAVENOUS | Status: AC
  Administered 2022-05-23: 100 mg via INTRAVENOUS
  Filled 2022-05-23: qty 10

## 2022-05-23 MED ORDER — SODIUM CHLORIDE 0.9 % IV SOLN: INTRAVENOUS | Status: DC

## 2022-05-23 MED ORDER — DEXTROSE 5 % IV SOLN: Freq: Once | INTRAVENOUS | Status: AC

## 2022-05-23 MED ORDER — SODIUM CHLORIDE 0.9% FLUSH
10.0000 mL | INTRAVENOUS | Status: AC | PRN
  Administered 2022-05-23: 10 mL

## 2022-05-23 MED ORDER — LEUCOVORIN CALCIUM INJECTION 350 MG
200.0000 mg/m2 | Freq: Once | INTRAVENOUS | Status: AC
  Administered 2022-05-23: 396 mg via INTRAVENOUS
  Filled 2022-05-23: qty 19.8

## 2022-05-23 MED ORDER — SODIUM CHLORIDE 0.9 % IV SOLN
2525.0000 mg/m2 | INTRAVENOUS | Status: DC
  Administered 2022-05-23: 5000 mg via INTRAVENOUS
  Filled 2022-05-23: qty 100

## 2022-05-23 MED ORDER — OXALIPLATIN CHEMO INJECTION 100 MG/20ML
85.0000 mg/m2 | Freq: Once | INTRAVENOUS | Status: AC
  Administered 2022-05-23: 170 mg via INTRAVENOUS
  Filled 2022-05-23: qty 34

## 2022-05-23 MED ORDER — PALONOSETRON HCL INJECTION 0.25 MG/5ML
0.2500 mg | Freq: Once | INTRAVENOUS | Status: AC
  Administered 2022-05-23: 0.25 mg via INTRAVENOUS
  Filled 2022-05-23: qty 5

## 2022-05-23 MED ORDER — SODIUM CHLORIDE 0.9 % IV SOLN
10.0000 mg | Freq: Once | INTRAVENOUS | Status: AC
  Administered 2022-05-23: 10 mg via INTRAVENOUS
  Filled 2022-05-23: qty 10

## 2022-05-23 NOTE — Patient Instructions (Signed)
[facility]  Discharge Instructions: Thank you for choosing [facility] to provide your oncology and hematology care.   If you have a lab appointment with the [facility], please go directly to the [facility] and check in at the registration area.   Wear comfortable clothing and clothing appropriate for easy access to any Portacath or PICC line.   We strive to give you quality time with your provider. You may need to reschedule your appointment if you arrive late (15 or more minutes).  Arriving late affects you and other patients whose appointments are after yours.  Also, if you miss three or more appointments without notifying the office, you may be dismissed from the clinic at the provider's discretion.      For prescription refill requests, have your pharmacy contact our office and allow 72 hours for refills to be completed.    Today you received the following chemotherapy and/or immunotherapy agents Doctaxel, Leucovorin, Oxaliplatin, 5-FU.   To help prevent nausea and vomiting after your treatment, we encourage you to take your nausea medication as directed.  BELOW ARE SYMPTOMS THAT SHOULD BE REPORTED IMMEDIATELY: *FEVER GREATER THAN 100.4 F (38 C) OR HIGHER *CHILLS OR SWEATING *NAUSEA AND VOMITING THAT IS NOT CONTROLLED WITH YOUR NAUSEA MEDICATION *UNUSUAL SHORTNESS OF BREATH *UNUSUAL BRUISING OR BLEEDING *URINARY PROBLEMS (pain or burning when urinating, or frequent urination) *BOWEL PROBLEMS (unusual diarrhea, constipation, pain near the anus) TENDERNESS IN MOUTH AND THROAT WITH OR WITHOUT PRESENCE OF ULCERS (sore throat, sores in mouth, or a toothache) UNUSUAL RASH, SWELLING OR PAIN  UNUSUAL VAGINAL DISCHARGE OR ITCHING   Items with * indicate a potential emergency and should be followed up as soon as possible or go to the Emergency Department if any problems should occur.  Please show the CHEMOTHERAPY ALERT CARD or  IMMUNOTHERAPY ALERT CARD at check-in to the Emergency Department and triage nurse.  Should you have questions after your visit or need to cancel or reschedule your appointment, please contact [facility]  Dept: [phone]  and follow the prompts.  Office hours are 8:00 a.m. to 4:30 p.m. Monday - Friday. Please note that voicemails left after 4:00 p.m. may not be returned until the following business day.  We are closed weekends and major holidays. You have access to a nurse at all times for urgent questions. Please call the main number to the clinic Dept: [phone] and follow the prompts.   For any non-urgent questions, you may also contact your provider using MyChart. We now offer e-Visits for anyone 70 and older to request care online for non-urgent symptoms. For details visit [email or web address].   Also download the MyChart app! Go to the app store, search "MyChart", open the app, select [facility], and log in with your MyChart username and password.

## 2022-05-23 NOTE — Progress Notes (Signed)
Nutrition Follow-up:  Patient with gastric cancer. She is receiving neoadjuvant FLOT q14d.  Met with patient in infusion. She reports appetite has improved. Patient has been snacking in between meals. She likes peanut butter. Patient has been drinking Ensure. She would appreciate assistance with Ensure supplements. Patient says she is drinking plenty of water. Patient denies nausea or vomiting. She is taking antiemetics daily. Patient denies constipation, diarrhea.   Medications: reviewed   Labs: reviewed   Anthropometrics: Wt 181 lb today increased   1/10 - 179 lb 4.8 oz 1/4 - 176 lb    NUTRITION DIAGNOSIS: Unintended wt loss stable   Severe malnutrition continues   INTERVENTION:  Continue strategies for increasing calories and protein with small frequent meals and snacks Continue drinking 2 Ensure Plus/equivalent  One complimentary case Ensure Plus HP provided     MONITORING, EVALUATION, GOAL: weight trends, intake   NEXT VISIT: To be scheduled as needed

## 2022-05-24 ENCOUNTER — Telehealth: Payer: Self-pay | Admitting: Hematology

## 2022-05-24 ENCOUNTER — Inpatient Hospital Stay: Payer: Commercial Managed Care - HMO

## 2022-05-24 ENCOUNTER — Encounter: Payer: Self-pay | Admitting: Hematology

## 2022-05-24 VITALS — BP 123/87 | HR 87 | Temp 98.7°F | Resp 18

## 2022-05-24 DIAGNOSIS — Z5111 Encounter for antineoplastic chemotherapy: Secondary | ICD-10-CM | POA: Diagnosis not present

## 2022-05-24 DIAGNOSIS — C162 Malignant neoplasm of body of stomach: Secondary | ICD-10-CM

## 2022-05-24 MED ORDER — HEPARIN SOD (PORK) LOCK FLUSH 100 UNIT/ML IV SOLN
500.0000 [IU] | Freq: Once | INTRAVENOUS | Status: AC | PRN
  Administered 2022-05-24: 500 [IU]

## 2022-05-24 MED ORDER — SODIUM CHLORIDE 0.9% FLUSH
10.0000 mL | INTRAVENOUS | Status: DC | PRN
  Administered 2022-05-24: 10 mL

## 2022-05-24 MED ORDER — PEGFILGRASTIM INJECTION 6 MG/0.6ML ~~LOC~~
6.0000 mg | PREFILLED_SYRINGE | Freq: Once | SUBCUTANEOUS | Status: AC
  Administered 2022-05-24: 6 mg via SUBCUTANEOUS
  Filled 2022-05-24: qty 0.6

## 2022-05-24 NOTE — Telephone Encounter (Signed)
Patient aware of all upcoming appointments

## 2022-05-25 ENCOUNTER — Ambulatory Visit: Payer: Commercial Managed Care - HMO

## 2022-05-25 ENCOUNTER — Other Ambulatory Visit: Payer: Self-pay

## 2022-05-28 ENCOUNTER — Encounter: Payer: Self-pay | Admitting: Hematology

## 2022-05-28 ENCOUNTER — Inpatient Hospital Stay: Payer: Commercial Managed Care - HMO | Admitting: Licensed Clinical Social Worker

## 2022-05-28 DIAGNOSIS — C162 Malignant neoplasm of body of stomach: Secondary | ICD-10-CM

## 2022-05-28 NOTE — Progress Notes (Signed)
Received call from patient regarding [name].  Introduced myself as Arboriculturist and to offer available resources.   Discussed one-time $1000 [employer] grant to assist with personal expenses while going through treatment. Advised what is needed to apply and she will provide at next visit on 06/06/22 at registration to be scanned to me. She will also receive grant paperwork to complete and be scanned and emailed to me.  Advised I will contact her to discuss grant in detail when I return on 2/9, however, I did explain the gift cards and how they work as well. She verbalized understanding.  She has my card for any additional financial questions or concerns.

## 2022-05-28 NOTE — Progress Notes (Signed)
[facility]  Initial Assessment   Sheryl [name] is a 57 y.o. year old female contacted by phone. Clinical Social Work was referred by medical provider for assessment of psychosocial needs.   SDOH (Social Determinants of Health) assessments performed: Yes   SDOH Screenings   Depression (PHQ2-9): Low Risk  (12/29/2021)  Tobacco Use: Low Risk  (05/23/2022)     Distress Screen completed: No     No data to display            Family/Social Information:  Housing Arrangement: Pt resides w/ her mother who has a home health aide and pt's aunt has been staying w/ pt as well to assist w/ her mother. Family members/support persons in your life? Pt's son resides in [place], but has been coming to see pt on the weekends and pt's daughter resides locally and has also been checking in frequently on pt. 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  Income source: No income Financial concerns: Yes, current concerns Type of concern: Utilities, Rent/ mortgage, Medical bills, and pt has applied for Medicaid and SSI.  At present pt has [employer] place Herbalist access concerns: no Religious or spiritual practice: Yes-Holiness Services Currently in place:  none  Coping/ Adjustment to diagnosis: Patient understands treatment plan and what happens next? yes Concerns about diagnosis and/or treatment: How I will care for other members of my family, Overwhelmed by information, and pt has not worked since the end of December and will not be able to take on a new job now which leaves her with financial concerns Patient reported stressors: Finances, Anxiety/ nervousness, and Adjusting to my illness Hopes and/or priorities: Pt's priority is to continue w/ treatment w/ the hope of positive results Patient enjoys time with family/ friends Current coping skills/ strengths: Capable of  independent living , Armed forces logistics/support/administrative officer , Motivation for treatment/growth , and Supportive family/friends     SUMMARY: Current SDOH Barriers:  Financial constraints related to unemployment  Clinical Social Work Clinical Goal(s):  Explore community resource options for unmet needs related to:  Financial Strain   Interventions: Discussed common feeling and emotions when being diagnosed with cancer, and the importance of support during treatment Informed patient of the support team roles and support services at [facility] Provided [facility] contact information and encouraged patient to call with any questions or concerns Referred patient to Arboriculturist and will meet w/ pt during her next infusion to obtain a signature for the [facility] referral form.   Follow Up Plan: CSW will see patient on 2/7 Patient verbalizes understanding of plan: Yes    Henriette Combs, LCSW

## 2022-05-30 ENCOUNTER — Ambulatory Visit (HOSPITAL_COMMUNITY)
Admission: RE | Admit: 2022-05-30 | Discharge: 2022-05-30 | Disposition: A | Discharge status: Home / Self Care | Payer: Commercial Managed Care - HMO | Source: Ambulatory Visit | Attending: Hematology | Admitting: Hematology

## 2022-05-30 DIAGNOSIS — C162 Malignant neoplasm of body of stomach: Secondary | ICD-10-CM | POA: Diagnosis present

## 2022-05-30 LAB — GLUCOSE, CAPILLARY: Glucose-Capillary: 89 mg/dL (ref 70–99)

## 2022-05-30 MED ORDER — FLUDEOXYGLUCOSE F - 18 (FDG) INJECTION: 9.0000 | Freq: Once | INTRAVENOUS | Status: DC | PRN

## 2022-05-30 MED ORDER — FLUDEOXYGLUCOSE F - 18 (FDG) INJECTION
9.0000 | Freq: Once | INTRAVENOUS | Status: AC | PRN
  Administered 2022-05-30: 9 via INTRAVENOUS

## 2022-06-05 MED FILL — Dexamethasone Sodium Phosphate Inj 100 MG/10ML: INTRAMUSCULAR | Qty: 1 | Status: AC

## 2022-06-05 NOTE — Assessment & Plan Note (Addendum)
-  LD4C4Q1 with peritoneal metastasis.  -Diagnosed in 03/2022, initial CT scan was negative for metastasis, however exploratory laparoscope showed peritoneal metastasis.   -she has started first line chemo FLOT on 1/10 -I have requested HER2, MMR and FO on her biopsy, results are still pending. PD-L1 0-1%, no significant benefit from PD-L1 immunotherapy, I reviewed with patient and her husband. -She has been tolerating chemo very well, will continue for now  -PET scan from 05/30/2022 was negative for primary tumor or metastatic disease

## 2022-06-05 NOTE — Progress Notes (Unsigned)
[facility]   Telephone:([phone] Fax:([phone]   Clinic Follow up Note   Patient Care Team: Janith Lima, MD as PCP - General (Internal Medicine) Truitt Merle, MD as Consulting Physician (Oncology)  Date of Service:  06/05/2022  CHIEF COMPLAINT: f/u of  Gastric Cancer    CURRENT THERAPY:  FLOT q14d    ASSESSMENT: *** Sheryl [name] is a 57 y.o. female with   No problem-specific Assessment & Plan notes found for this encounter.  ***   PLAN:  - -   SUMMARY OF ONCOLOGIC HISTORY: Oncology History  Gastric cancer ([place])  03/30/2022 Procedure   EGD:  Impression:  - Normal esophagus. - A few gastric polyps. Biopsied. - Gastritis. Biopsied. - Non-bleeding gastric ulcer with no stigmata of bleeding. Biopsied. - Normal examined duodenum. Biopsied.  Findings: Diffuse moderate inflammation characterized by congestion (edema), friability and granularity was found in the cardia, in the gastric fundus and in the gastric body. There were associated erosions in multiple places. Biopsies were taken from the antrum, body, and fundus with a cold forceps for histology. Estimated blood loss was minimal.  One non-bleeding cratered gastric ulcer with no stigmata of bleeding was found on the greater curvature of the stomach. The lesion was 6 mm in largest dimension. The mucosa around the ulcer was heaped and led to some deformity in the antrum. Biopsies were taken with a cold forceps for histology. Estimated blood loss was minimal.    03/30/2022 Pathology Results   Patient: MALILLANY, KAZLAUSKAS  Accession: [id number]  Diagnosis 1. Surgical [P], duodenal - BENIGN SMALL BOWEL MUCOSA WITH NO SIGNIFICANT PATHOLOGIC CHANGES 2. Surgical [P], gastric antrum - GASTRIC ANTRAL MUCOSA WITH FEATURES OF REACTIVE GASTROPATHY - NEGATIVE FOR H. PYLORI ON H&E STAIN - NEGATIVE FOR INTESTINAL METAPLASIA OR MALIGNANCY 3. Surgical [P], gastric body - GASTRIC OXYNTIC MUCOSA  WITH REACTIVE/REPARATIVE CHANGES - NEGATIVE FOR H. PYLORI ON H&E STAIN - NEGATIVE FOR INTESTINAL METAPLASIA, DYSPLASIA OR MALIGNANCY 4. Surgical [P], greater curve ulceration - ADENOCARCINOMA WITH SIGNET RING CELL FEATURES (SEE NOTE) 5. Surgical [P], gastric polyps - ADENOCARCINOMA WITH SIGNET RING CELL FEATURES (SEE NOTE) 6. Surgical [P], fundus (gastric) - ADENOCARCINOMA WITH SIGNET RING CELL FEATURES (SEE NOTE) 7. Surgical [P], colon, ascending, polyp (1) - TUBULAR ADENOMA. - NO HIGH GRADE DYSPLASIA OR MALIGNANCY. 8. Surgical [P], colon, transverse, polyp (1) - TUBULAR ADENOMA. - NO HIGH GRADE DYSPLASIA OR MALIGNANCY.    04/13/2022 Initial Diagnosis   Gastric cancer ([place])   04/19/2022 Cancer Staging   Staging form: Stomach, AJCC 8th Edition - Clinical stage from 04/19/2022: Stage IVB (cT2, cN0, pM1) - Signed by Truitt Merle, MD on 05/08/2022 Total positive nodes: 0   05/05/2022 Genetic Testing   Negative genetic testing on the Multi-cancer gene panel + RNA.  FH c.259C>T VUS identified.  The report date is May 05, 2022.  The Multi-Cancer + RNA Panel offered by [facility] includes sequencing and/or deletion/duplication analysis of the following 70 genes:  AIP*, ALK, APC*, ATM*, AXIN2*, BAP1*, BARD1*, BLM*, BMPR1A*, BRCA1*, BRCA2*, BRIP1*, CDC73*, CDH1*, CDK4, CDKN1B*, CDKN2A, CHEK2*, CTNNA1*, DICER1*, EPCAM (del/dup only), EGFR, FH*, FLCN*, GREM1 (promoter dup only), HOXB13, KIT, LZTR1, MAX*, MBD4, MEN1*, MET, MITF, MLH1*, MSH2*, MSH3*, MSH6*, MUTYH*, NF1*, NF2*, NTHL1*, PALB2*, PDGFRA, PMS2*, POLD1*, POLE*, POT1*, PRKAR1A*, PTCH1*, PTEN*, RAD51C*, RAD51D*, RB1*, RET, SDHA* (sequencing only), SDHAF2*, SDHB*, SDHC*, SDHD*, SMAD4*, SMARCA4*, SMARCB1*, SMARCE1*, STK11*, SUFU*, TMEM127*, TP53*, TSC1*, TSC2*, VHL*. RNA analysis is performed for * genes.  05/09/2022 -  Chemotherapy   Patient is on Treatment Plan : GASTROESOPHAGEAL FLOT q14d X 4 cycles      Miscellaneous   Foundation  One  Biomarker Findings Microsatellite status- Cannot be determined Tumor Mutational Burden- Cannot be determined  Genomic Findings  FGFR2 amplification,FGFR2-TACC2 fusion,  Rearrangement intron 17 ARAF amplification CCND3 amplification TP53 V234fs*74        INTERVAL HISTORY: *** Sheryl [name] is here for a follow up of   Gastric Cancer  She was last seen by me on 05/23/2022 She presents to the clinic      All other systems were reviewed with the patient and are negative.  MEDICAL HISTORY:  Past Medical History:  Diagnosis Date   Blood transfusion without reported diagnosis    had transfusion with hysterectomy   Cataract    Colon polyps 2012   Diabetes ([facility]) 03/13/2021   Diabetes ([facility]) 05/21/2019   Family history of breast cancer    Family history of pancreatic cancer    Family history of stomach cancer    Fibroid    GERD (gastroesophageal reflux disease)    H/O blood clots    History of hysterectomy    fibroids and heavy cycles   Hypertension     SURGICAL HISTORY: Past Surgical History:  Procedure Laterality Date   ABDOMINAL HYSTERECTOMY     BIOPSY  04/19/2022   Procedure: BIOPSY;  Surgeon: Irving Copas., MD;  Location: [facility];  Service: Gastroenterology;;   COLONOSCOPY     ESOPHAGOGASTRODUODENOSCOPY (EGD) WITH PROPOFOL N/A 04/19/2022   Procedure: ESOPHAGOGASTRODUODENOSCOPY (EGD) WITH PROPOFOL;  Surgeon: Irving Copas., MD;  Location: [facility];  Service: Gastroenterology;  Laterality: N/A;   EUS N/A 04/19/2022   Procedure: UPPER ENDOSCOPIC ULTRASOUND (EUS) RADIAL;  Surgeon: Irving Copas., MD;  Location: [facility];  Service: Gastroenterology;  Laterality: N/A;   EXCISION OF SKIN TAG  05/03/2022   Procedure: EXCISION OF CHEST WALL SKIN LESION;  Surgeon: Dwan Bolt, MD;  Location: [facility];  Service: General;;   LAPAROSCOPY N/A 05/03/2022   Procedure: LAPAROSCOPY DIAGNOSTIC WITH PERITONEAL WASHINGS;  Surgeon: Dwan Bolt, MD;  Location: [facility];  Service: General;  Laterality: N/A;   POLYPECTOMY  04/19/2022   Procedure: POLYPECTOMY;  Surgeon: Irving Copas., MD;  Location: [facility];  Service: Gastroenterology;;   PORTACATH PLACEMENT N/A 05/03/2022   Procedure: INSERTION PORT-A-CATH WITH ULTRASOUND GUIDANCE;  Surgeon: Dwan Bolt, MD;  Location: [facility];  Service: General;  Laterality: N/A;   UPPER GASTROINTESTINAL ENDOSCOPY      I have reviewed the social history and family history with the patient and they are unchanged from previous note.  ALLERGIES:  is allergic to aspirin, cyclobenzaprine, naproxen sodium, zithromax [azithromycin dihydrate], and dilaudid [hydromorphone].  MEDICATIONS:  Current Outpatient Medications  Medication Sig Dispense Refill   acetaminophen (TYLENOL) 500 MG tablet Take 1,000 mg by mouth every 8 (eight) hours as needed (pain).     amLODipine (NORVASC) 10 MG tablet TAKE 1 TABLET (10 MG TOTAL) BY MOUTH DAILY. 90 tablet 1   Bacillus Coagulans-Inulin (PROBIOTIC-PREBIOTIC) 1-250 BILLION-MG CAPS Take 2 capsules by mouth daily.     Biotin 1000 MCG CHEW Chew 1,000 mcg by mouth daily.     Blood Glucose Monitoring Suppl (TRUE METRIX METER) w/Device KIT 1 kit by Does not apply route 3 (three) times daily as needed. 1 kit 0   Cholecalciferol (VITAMIN D3) 125 MCG (5000 UT) TABS Take 5,000 Units by mouth daily.  lidocaine-prilocaine (EMLA) cream Apply to affected area once as directed. 30 g 3   ondansetron (ZOFRAN) 8 MG tablet Take 1 tablet (8 mg total) by mouth every 8 (eight) hours as needed for nausea or vomiting. Start on the third day after chemotherapy. 30 tablet 1   ondansetron (ZOFRAN-ODT) 4 MG disintegrating tablet Take 1 tablet (4 mg total) by mouth every 6 (six) hours. Dissolve one tab on tongue 30 minutes before each dose of bowel prep (Patient not taking: Reported on 04/18/2022) 2 tablet 0   pantoprazole (PROTONIX) 40 MG tablet Take 1 tablet (40 mg total) by  mouth 2 (two) times daily. 180 tablet 0   prochlorperazine (COMPAZINE) 10 MG tablet Take 1 tablet (10 mg total) by mouth every 6 (six) hours as needed for nausea or vomiting. 30 tablet 1   rosuvastatin (CRESTOR) 5 MG tablet Take 1 tablet (5 mg total) by mouth daily. (Patient not taking: Reported on 04/18/2022) 90 tablet 1   sucralfate (CARAFATE) 1 g tablet Take 1 tablet (1 g total) by mouth 2 (two) times daily. 60 tablet 6   Current Facility-Administered Medications  Medication Dose Route Frequency Provider Last Rate Last Admin   0.9 %  sodium chloride infusion  500 mL Intravenous Continuous [place], MD        PHYSICAL EXAMINATION: ECOG PERFORMANCE STATUS: {CHL ONC ECOG RK:[id number]}  There were no vitals filed for this visit. Wt Readings from Last 3 Encounters:  05/23/22 181 lb (82.1 kg)  05/09/22 179 lb 4.8 oz (81.3 kg)  05/03/22 176 lb (79.8 kg)    {Only keep what was examined. If exam not performed, can use .CEXAM } GENERAL:alert, no distress and comfortable SKIN: skin color, texture, turgor are normal, no rashes or significant lesions EYES: normal, Conjunctiva are pink and non-injected, sclera clear {OROPHARYNX:no exudate, no erythema and lips, buccal mucosa, and tongue normal}  NECK: supple, thyroid normal size, non-tender, without nodularity LYMPH:  no palpable lymphadenopathy in the cervical, axillary {or inguinal} LUNGS: clear to auscultation and percussion with normal breathing effort HEART: regular rate & rhythm and no murmurs and no lower extremity edema ABDOMEN:abdomen soft, non-tender and normal bowel sounds Musculoskeletal:no cyanosis of digits and no clubbing  NEURO: alert & oriented x 3 with fluent speech, no focal motor/sensory deficits  LABORATORY DATA:  I have reviewed the data as listed    Latest Ref Rng & Units 05/23/2022    7:42 AM 05/09/2022    8:13 AM 04/13/2022   12:11 PM  CBC  WBC 4.0 - 10.5 K/uL 5.0  4.2  4.0   Hemoglobin 12.0 - 15.0 g/dL  12.7  13.9  14.5   Hematocrit 36.0 - 46.0 % 36.2  39.3  42.0   Platelets 150 - 400 K/uL 246  316  288         Latest Ref Rng & Units 05/23/2022    7:42 AM 05/09/2022    8:13 AM 04/13/2022   12:11 PM  CMP  Glucose 70 - 99 mg/dL 88  92  96   BUN 6 - 20 mg/dL 13  14  11    Creatinine 0.44 - 1.00 mg/dL 0.75  0.69  0.76   Sodium 135 - 145 mmol/L 140  139  140   Potassium 3.5 - 5.1 mmol/L 4.1  3.5  3.8   Chloride 98 - 111 mmol/L 105  105  104   CO2 22 - 32 mmol/L 31  29  32   Calcium 8.9 -  10.3 mg/dL 9.2  9.6  10.4   Total Protein 6.5 - 8.1 g/dL 6.8  7.1  7.3   Total Bilirubin 0.3 - 1.2 mg/dL 0.3  0.7  0.8   Alkaline Phos 38 - 126 U/L 100  98  110   AST 15 - 41 U/L 19  20  25    ALT 0 - 44 U/L 18  19  24        RADIOGRAPHIC STUDIES: I have personally reviewed the radiological images as listed and agreed with the findings in the report. No results found.    No orders of the defined types were placed in this encounter.  All questions were answered. The patient knows to call the clinic with any problems, questions or concerns. No barriers to learning was detected. The total time spent in the appointment was {CHL ONC TIME VISIT - VQQUI:[id number]}.     Baldemar Friday, CMA 06/05/2022   I, Audry Riles, CMA, am acting as scribe for Truitt Merle, MD.   {Add scribe attestation statement}

## 2022-06-06 ENCOUNTER — Encounter: Payer: Self-pay | Admitting: Hematology

## 2022-06-06 ENCOUNTER — Inpatient Hospital Stay (HOSPITAL_BASED_OUTPATIENT_CLINIC_OR_DEPARTMENT_OTHER): Payer: Commercial Managed Care - HMO | Admitting: Hematology

## 2022-06-06 ENCOUNTER — Inpatient Hospital Stay: Payer: Commercial Managed Care - HMO | Attending: Physician Assistant

## 2022-06-06 ENCOUNTER — Inpatient Hospital Stay: Payer: Commercial Managed Care - HMO | Admitting: Licensed Clinical Social Worker

## 2022-06-06 ENCOUNTER — Inpatient Hospital Stay: Payer: Commercial Managed Care - HMO

## 2022-06-06 VITALS — BP 127/88 | HR 72 | Temp 97.5°F | Resp 18 | Ht 68.0 in | Wt 179.4 lb

## 2022-06-06 DIAGNOSIS — C162 Malignant neoplasm of body of stomach: Secondary | ICD-10-CM

## 2022-06-06 DIAGNOSIS — Z5189 Encounter for other specified aftercare: Secondary | ICD-10-CM | POA: Insufficient documentation

## 2022-06-06 DIAGNOSIS — C786 Secondary malignant neoplasm of retroperitoneum and peritoneum: Secondary | ICD-10-CM | POA: Insufficient documentation

## 2022-06-06 DIAGNOSIS — C169 Malignant neoplasm of stomach, unspecified: Secondary | ICD-10-CM | POA: Insufficient documentation

## 2022-06-06 DIAGNOSIS — Z5111 Encounter for antineoplastic chemotherapy: Secondary | ICD-10-CM | POA: Diagnosis present

## 2022-06-06 DIAGNOSIS — Z95828 Presence of other vascular implants and grafts: Secondary | ICD-10-CM

## 2022-06-06 LAB — CMP (CANCER CENTER ONLY)
ALT: 12 U/L (ref 0–44)
AST: 17 U/L (ref 15–41)
Albumin: 3.9 g/dL (ref 3.5–5.0)
Alkaline Phosphatase: 133 U/L — ABNORMAL HIGH (ref 38–126)
Anion gap: 6 (ref 5–15)
BUN: 8 mg/dL (ref 6–20)
CO2: 31 mmol/L (ref 22–32)
Calcium: 9.4 mg/dL (ref 8.9–10.3)
Chloride: 101 mmol/L (ref 98–111)
Creatinine: 0.64 mg/dL (ref 0.44–1.00)
GFR, Estimated: >60 mL/min (ref >60)
Glucose, Bld: 96 mg/dL (ref 70–99)
Potassium: 4.1 mmol/L (ref 3.5–5.1)
Sodium: 138 mmol/L (ref 135–145)
Total Bilirubin: 0.4 mg/dL (ref 0.3–1.2)
Total Protein: 6.6 g/dL (ref 6.5–8.1)

## 2022-06-06 LAB — CBC WITH DIFFERENTIAL (CANCER CENTER ONLY)
Abs Immature Granulocytes: 0.26 10*3/uL — ABNORMAL HIGH (ref 0.00–0.07)
Basophils Absolute: 0.1 10*3/uL (ref 0.0–0.1)
Basophils Relative: 1 %
Eosinophils Absolute: 0.1 10*3/uL (ref 0.0–0.5)
Eosinophils Relative: 1 %
HCT: 38 % (ref 36.0–46.0)
Hemoglobin: 12.8 g/dL (ref 12.0–15.0)
Immature Granulocytes: 2 %
Lymphocytes Relative: 8 %
Lymphs Abs: 1.2 10*3/uL (ref 0.7–4.0)
MCH: 29 pg (ref 26.0–34.0)
MCHC: 33.7 g/dL (ref 30.0–36.0)
MCV: 86 fL (ref 80.0–100.0)
Monocytes Absolute: 1 10*3/uL (ref 0.1–1.0)
Monocytes Relative: 7 %
Neutro Abs: 11.9 10*3/uL — ABNORMAL HIGH (ref 1.7–7.7)
Neutrophils Relative %: 81 %
Platelet Count: 306 10*3/uL (ref 150–400)
RBC: 4.42 MIL/uL (ref 3.87–5.11)
RDW: 15.3 % (ref 11.5–15.5)
WBC Count: 14.5 10*3/uL — ABNORMAL HIGH (ref 4.0–10.5)
nRBC: 0.6 % — ABNORMAL HIGH (ref 0.0–0.2)

## 2022-06-06 MED ORDER — LEUCOVORIN CALCIUM INJECTION 350 MG
200.0000 mg/m2 | Freq: Once | INTRAVENOUS | Status: AC
  Administered 2022-06-06: 396 mg via INTRAVENOUS
  Filled 2022-06-06: qty 19.8

## 2022-06-06 MED ORDER — SODIUM CHLORIDE 0.9 % IV SOLN
50.3000 mg/m2 | Freq: Once | INTRAVENOUS | Status: AC
  Administered 2022-06-06: 100 mg via INTRAVENOUS
  Filled 2022-06-06: qty 10

## 2022-06-06 MED ORDER — OXALIPLATIN CHEMO INJECTION 100 MG/20ML
85.0000 mg/m2 | Freq: Once | INTRAVENOUS | Status: AC
  Administered 2022-06-06: 170 mg via INTRAVENOUS
  Filled 2022-06-06: qty 34

## 2022-06-06 MED ORDER — SODIUM CHLORIDE 0.9 % IV SOLN
10.0000 mg | Freq: Once | INTRAVENOUS | Status: AC
  Administered 2022-06-06: 10 mg via INTRAVENOUS
  Filled 2022-06-06: qty 10

## 2022-06-06 MED ORDER — DEXTROSE 5 % IV SOLN: Freq: Once | INTRAVENOUS | Status: AC

## 2022-06-06 MED ORDER — SODIUM CHLORIDE 0.9% FLUSH
10.0000 mL | Freq: Once | INTRAVENOUS | Status: AC
  Administered 2022-06-06: 10 mL

## 2022-06-06 MED ORDER — PALONOSETRON HCL INJECTION 0.25 MG/5ML
0.2500 mg | Freq: Once | INTRAVENOUS | Status: AC
  Administered 2022-06-06: 0.25 mg via INTRAVENOUS
  Filled 2022-06-06: qty 5

## 2022-06-06 MED ORDER — SODIUM CHLORIDE 0.9 % IV SOLN
2600.0000 mg/m2 | INTRAVENOUS | Status: DC
  Administered 2022-06-06: 5000 mg via INTRAVENOUS
  Filled 2022-06-06: qty 100

## 2022-06-06 NOTE — Progress Notes (Signed)
[facility] CSW Progress Note  [facility] representative met with patient in infusion to obtain signature for the [facility] referral.   Referral completed and submitted on behalf of pt.  CSW to remain available as appropriate to provide support throughout duration of treatment.    Henriette Combs, LCSW

## 2022-06-06 NOTE — Patient Instructions (Signed)
[facility]  Discharge Instructions: Thank you for choosing [facility] to provide your oncology and hematology care.   If you have a lab appointment with the [facility], please go directly to the [facility] and check in at the registration area.   Wear comfortable clothing and clothing appropriate for easy access to any Portacath or PICC line.   We strive to give you quality time with your provider. You may need to reschedule your appointment if you arrive late (15 or more minutes).  Arriving late affects you and other patients whose appointments are after yours.  Also, if you miss three or more appointments without notifying the office, you may be dismissed from the clinic at the provider's discretion.      For prescription refill requests, have your pharmacy contact our office and allow 72 hours for refills to be completed.    Today you received the following chemotherapy and/or immunotherapy agents Taxol, oxaliplatin, leucovorin, 5FU      To help prevent nausea and vomiting after your treatment, we encourage you to take your nausea medication as directed.  BELOW ARE SYMPTOMS THAT SHOULD BE REPORTED IMMEDIATELY: *FEVER GREATER THAN 100.4 F (38 C) OR HIGHER *CHILLS OR SWEATING *NAUSEA AND VOMITING THAT IS NOT CONTROLLED WITH YOUR NAUSEA MEDICATION *UNUSUAL SHORTNESS OF BREATH *UNUSUAL BRUISING OR BLEEDING *URINARY PROBLEMS (pain or burning when urinating, or frequent urination) *BOWEL PROBLEMS (unusual diarrhea, constipation, pain near the anus) TENDERNESS IN MOUTH AND THROAT WITH OR WITHOUT PRESENCE OF ULCERS (sore throat, sores in mouth, or a toothache) UNUSUAL RASH, SWELLING OR PAIN  UNUSUAL VAGINAL DISCHARGE OR ITCHING   Items with * indicate a potential emergency and should be followed up as soon as possible or go to the Emergency Department if any problems should occur.  Please show the CHEMOTHERAPY ALERT CARD or  IMMUNOTHERAPY ALERT CARD at check-in to the Emergency Department and triage nurse.  Should you have questions after your visit or need to cancel or reschedule your appointment, please contact [facility]  Dept: [phone]  and follow the prompts.  Office hours are 8:00 a.m. to 4:30 p.m. Monday - Friday. Please note that voicemails left after 4:00 p.m. may not be returned until the following business day.  We are closed weekends and major holidays. You have access to a nurse at all times for urgent questions. Please call the main number to the clinic Dept: [phone] and follow the prompts.   For any non-urgent questions, you may also contact your provider using MyChart. We now offer e-Visits for anyone 23 and older to request care online for non-urgent symptoms. For details visit [email or web address].   Also download the MyChart app! Go to the app store, search "MyChart", open the app, select [facility], and log in with your MyChart username and password.

## 2022-06-07 ENCOUNTER — Inpatient Hospital Stay: Payer: Commercial Managed Care - HMO

## 2022-06-07 VITALS — BP 145/95 | HR 79 | Temp 98.9°F | Resp 18

## 2022-06-07 DIAGNOSIS — C162 Malignant neoplasm of body of stomach: Secondary | ICD-10-CM

## 2022-06-07 DIAGNOSIS — Z5111 Encounter for antineoplastic chemotherapy: Secondary | ICD-10-CM | POA: Diagnosis not present

## 2022-06-07 MED ORDER — PEGFILGRASTIM INJECTION 6 MG/0.6ML ~~LOC~~
6.0000 mg | PREFILLED_SYRINGE | Freq: Once | SUBCUTANEOUS | Status: AC
  Administered 2022-06-07: 6 mg via SUBCUTANEOUS
  Filled 2022-06-07: qty 0.6

## 2022-06-08 ENCOUNTER — Encounter: Payer: Self-pay | Admitting: Hematology

## 2022-06-08 ENCOUNTER — Encounter: Payer: Commercial Managed Care - HMO | Admitting: Gastroenterology

## 2022-06-08 NOTE — Progress Notes (Signed)
Called approved patient to discuss [employer] grant expenses in detail. Patient had paperwork present to follow along.  Discussed expenses in detail and how they are covered. She verbalized understanding. She states she did receive a gift card on that day.  She has my card for any additional financial questions or concerns.

## 2022-06-08 NOTE — Progress Notes (Signed)
Patient was approved for one-time $1000 [employer] grant on 2/7 to assist with personal expenses while going through treatment. She was given a copy of the approval paperwork and expense sheet along with my card for any additional financial questions or concerns.

## 2022-06-10 ENCOUNTER — Other Ambulatory Visit: Payer: Self-pay | Admitting: Hematology

## 2022-06-10 ENCOUNTER — Other Ambulatory Visit: Payer: Self-pay | Admitting: Internal Medicine

## 2022-06-10 DIAGNOSIS — C162 Malignant neoplasm of body of stomach: Secondary | ICD-10-CM

## 2022-06-10 MED ORDER — PROCHLORPERAZINE MALEATE 10 MG PO TABS
10.0000 mg | ORAL_TABLET | Freq: Four times a day (QID) | ORAL | 1 refills | Status: DC | PRN
  Filled 2022-06-10: qty 30, 8d supply, fill #0

## 2022-06-11 ENCOUNTER — Other Ambulatory Visit: Payer: Self-pay

## 2022-06-11 MED ORDER — PROBIOTIC-PREBIOTIC 1-250 BILLION-MG PO CAPS
2.0000 | ORAL_CAPSULE | Freq: Every day | ORAL | 1 refills | Status: DC
  Filled 2022-06-11: qty 30, 30d supply, fill #0
  Filled 2022-06-26 – 2022-07-02 (×2): qty 30, 30d supply, fill #1

## 2022-06-11 MED ORDER — ACETAMINOPHEN 500 MG PO TABS
1000.0000 mg | ORAL_TABLET | Freq: Three times a day (TID) | ORAL | 1 refills | Status: AC | PRN
  Filled 2022-06-11: qty 30, 5d supply, fill #0

## 2022-06-12 ENCOUNTER — Other Ambulatory Visit: Payer: Self-pay

## 2022-06-14 ENCOUNTER — Other Ambulatory Visit (HOSPITAL_COMMUNITY): Payer: Self-pay

## 2022-06-14 ENCOUNTER — Telehealth: Payer: Self-pay

## 2022-06-14 ENCOUNTER — Other Ambulatory Visit: Payer: Self-pay

## 2022-06-14 ENCOUNTER — Encounter: Payer: Self-pay | Admitting: Hematology

## 2022-06-14 MED ORDER — PREDNISOLONE SODIUM PHOSPHATE 15 MG/5ML PO SOLN
Freq: Three times a day (TID) | OROMUCOSAL | 0 refills | Status: DC | PRN
  Filled 2022-06-14: qty 140, 9d supply, fill #0

## 2022-06-14 NOTE — Telephone Encounter (Signed)
Patient called in stating she has a sore throat and has had it for 2 weeks, she also has a cough along with it. She has tried using salt water, throat lozengers, hot teas and mucinex. She has no fever, has not tested for covid or flu. She stated about two weeks ago she drank something cold and it started after that. Tammi Sou RN instructed her to make sure she shouldn't drink anything cold all liquids should be warmed before drinking. Made patient aware that we will call her in some Magic Mouthwash at [facility].

## 2022-06-19 MED FILL — Dexamethasone Sodium Phosphate Inj 100 MG/10ML: INTRAMUSCULAR | Qty: 1 | Status: AC

## 2022-06-19 NOTE — Progress Notes (Unsigned)
[facility]   [phone] Fax:([phone]) [phone]   Clinic Follow up Note   Patient Care Team: Janith Lima, MD as PCP - General (Internal Medicine) Truitt Merle, MD as Consulting Physician (Oncology)  Date of Service:  06/20/2022  CHIEF COMPLAINT: f/u of Gastric Cancer   CURRENT THERAPY:  FOLFOX q14d X 12 cycles    ASSESSMENT:  Sheryl [name] is a 57 y.o. female with   Gastric cancer ([place]) -[id number] with peritoneal metastasis.  -Diagnosed in 03/2022, initial CT scan was negative for metastasis, however exploratory laparoscope showed peritoneal metastasis.   -she has started first line chemo FLOT on 1/10 -I have requested HER2, MMR and FO on her biopsy, results are still pending. PD-L1 0-1%, no significant benefit from PD-L1 immunotherapy, I reviewed with patient and her husband. -She has been tolerating chemo very well, will continue for now  -PET scan from 05/30/2022 was negative for primary tumor or metastatic disease, the known peritoneal mets did not show on PET.  -Foundation One showed no targetable mutations  -She is tolerating chemo well overall, but does notice significant fatigue for 3 to 5 days after chemo.  She would like to back off chemo if it does not impact her cancer outcome.  I discussed that also FLOT may control her cancer better, it does not necessarily prolong her life more than FOLFOX.  At after extensive discussion with patient, and her family (her daughter had numerous questions about her PET scan interpretation, cancer treatment, and prognosis it etc.),  We will change her treatment to FOLFOX today, and continue every 2 weeks. -I also discussed option of CapeOx, if she does not like 5-FU pump infusion.  We also discussed changing to maintenance therapy down the road.      PLAN: -lab reviewed -will change her chemo from FLOT to [facility] due to her fatigue,  labs adequate for treatment -Discuss her recent PET scan findings and how to  interpret the result with pt and her family in great detail  -lab/flush,f/u in 07/04/2022   SUMMARY OF ONCOLOGIC HISTORY: Oncology History Overview Note   Cancer Staging  Gastric cancer [facility]) Staging form: Stomach, AJCC 8th Edition - Clinical stage from 04/19/2022: Stage IVB (cT2, cN0, pM1) - Signed by Truitt Merle, MD on 05/08/2022 Total positive nodes: 0     Gastric cancer ([place])  03/30/2022 Procedure   EGD:  Impression:  - Normal esophagus. - A few gastric polyps. Biopsied. - Gastritis. Biopsied. - Non-bleeding gastric ulcer with no stigmata of bleeding. Biopsied. - Normal examined duodenum. Biopsied.  Findings: Diffuse moderate inflammation characterized by congestion (edema), friability and granularity was found in the cardia, in the gastric fundus and in the gastric body. There were associated erosions in multiple places. Biopsies were taken from the antrum, body, and fundus with a cold forceps for histology. Estimated blood loss was minimal.  One non-bleeding cratered gastric ulcer with no stigmata of bleeding was found on the greater curvature of the stomach. The lesion was 6 mm in largest dimension. The mucosa around the ulcer was heaped and led to some deformity in the antrum. Biopsies were taken with a cold forceps for histology. Estimated blood loss was minimal.    03/30/2022 Pathology Results   [name], Sheryl [name]  Accession: [id number]  Diagnosis 1. Surgical [P], duodenal - BENIGN SMALL BOWEL MUCOSA WITH NO SIGNIFICANT PATHOLOGIC CHANGES 2. Surgical [P], gastric antrum - GASTRIC ANTRAL MUCOSA WITH FEATURES OF REACTIVE GASTROPATHY - NEGATIVE FOR H.  PYLORI ON H&E STAIN - NEGATIVE FOR INTESTINAL METAPLASIA OR MALIGNANCY 3. Surgical [P], gastric body - GASTRIC OXYNTIC MUCOSA WITH REACTIVE/REPARATIVE CHANGES - NEGATIVE FOR H. PYLORI ON H&E STAIN - NEGATIVE FOR INTESTINAL METAPLASIA, DYSPLASIA OR MALIGNANCY 4. Surgical [P], greater curve ulceration -  ADENOCARCINOMA WITH SIGNET RING CELL FEATURES (SEE NOTE) 5. Surgical [P], gastric polyps - ADENOCARCINOMA WITH SIGNET RING CELL FEATURES (SEE NOTE) 6. Surgical [P], fundus (gastric) - ADENOCARCINOMA WITH SIGNET RING CELL FEATURES (SEE NOTE) 7. Surgical [P], colon, ascending, polyp (1) - TUBULAR ADENOMA. - NO HIGH GRADE DYSPLASIA OR MALIGNANCY. 8. Surgical [P], colon, transverse, polyp (1) - TUBULAR ADENOMA. - NO HIGH GRADE DYSPLASIA OR MALIGNANCY.    04/13/2022 Initial Diagnosis   Gastric cancer ([place])   04/19/2022 Cancer Staging   Staging form: Stomach, AJCC 8th Edition - Clinical stage from 04/19/2022: Stage IVB (cT2, cN0, pM1) - Signed by Truitt Merle, MD on 05/08/2022 Total positive nodes: 0   05/05/2022 Genetic Testing   Negative genetic testing on the Multi-cancer gene panel + RNA.  FH c.259C>T VUS identified.  The report date is May 05, 2022.  The Multi-Cancer + RNA Panel offered by [facility] includes sequencing and/or deletion/duplication analysis of the following 70 genes:  AIP*, ALK, APC*, ATM*, AXIN2*, BAP1*, BARD1*, BLM*, BMPR1A*, BRCA1*, BRCA2*, BRIP1*, CDC73*, CDH1*, CDK4, CDKN1B*, CDKN2A, CHEK2*, CTNNA1*, DICER1*, EPCAM (del/dup only), EGFR, FH*, FLCN*, GREM1 (promoter dup only), HOXB13, KIT, LZTR1, MAX*, MBD4, MEN1*, MET, MITF, MLH1*, MSH2*, MSH3*, MSH6*, MUTYH*, NF1*, NF2*, NTHL1*, PALB2*, PDGFRA, PMS2*, POLD1*, POLE*, POT1*, PRKAR1A*, PTCH1*, PTEN*, RAD51C*, RAD51D*, RB1*, RET, SDHA* (sequencing only), SDHAF2*, SDHB*, SDHC*, SDHD*, SMAD4*, SMARCA4*, SMARCB1*, SMARCE1*, STK11*, SUFU*, TMEM127*, TP53*, TSC1*, TSC2*, VHL*. RNA analysis is performed for * genes.    05/09/2022 - 06/07/2022 Chemotherapy   Patient is on Treatment Plan : GASTROESOPHAGEAL FLOT q14d X 4 cycles      Miscellaneous   Foundation One  Biomarker Findings Microsatellite status- Cannot be determined Tumor Mutational Burden- Cannot be determined  Genomic Findings  FGFR2 amplification,FGFR2-TACC2 fusion,   Rearrangement intron 17 ARAF amplification CCND3 amplification TP53 V235fs*74     05/30/2022 Imaging    IMPRESSION: 1. Mild hypermetabolism corresponding to a dominant left upper quadrant mass and smaller perigastric nodules or nodes. 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 Recommend attention to the dominant left upper quadrant soft tissue mass on follow-up exams to exclude unlikely recurrent lymphoma. 2. No gastric hypermetabolism and no typical findings of metastatic disease.   06/20/2022 -  Chemotherapy   Patient is on Treatment Plan : GASTRIC FOLFOX q14d x 12 cycles        INTERVAL HISTORY:  Sheryl [name] is here for a follow up of Gastric Cancer  She was last seen by me on 06/06/2022 She presents to the clinic accompanied by father and brother.Pt had a question about reducing the dose  on  the chemo. Pt denies having diarrhea. Pt notice that her finger and the bottom of her feet and tongue are turning black in color. Pt also state she is fatigue. Pt has a lot of questions about her Cancer diagnose.Pt states that the pain in her throat has minimize. Pt denies having fever.       All other systems were reviewed with the patient and are negative.  MEDICAL HISTORY:  Past Medical History:  Diagnosis Date   Blood transfusion without reported diagnosis    had transfusion with hysterectomy   Cataract  Colon polyps 2012   Diabetes ([place]) 03/13/2021   Diabetes ([place]) 05/21/2019   Family history of breast cancer    Family history of pancreatic cancer    Family history of stomach cancer    Fibroid    GERD (gastroesophageal reflux disease)    H/O blood clots    History of hysterectomy    fibroids and heavy cycles   Hypertension     SURGICAL HISTORY: Past Surgical History:  Procedure Laterality Date   ABDOMINAL HYSTERECTOMY     BIOPSY  04/19/2022   Procedure: BIOPSY;  Surgeon: Irving Copas., MD;  Location: [facility]  ENDOSCOPY;  Service: Gastroenterology;;   COLONOSCOPY     ESOPHAGOGASTRODUODENOSCOPY (EGD) WITH PROPOFOL N/A 04/19/2022   Procedure: ESOPHAGOGASTRODUODENOSCOPY (EGD) WITH PROPOFOL;  Surgeon: Irving Copas., MD;  Location: [facility] ENDOSCOPY;  Service: Gastroenterology;  Laterality: N/A;   EUS N/A 04/19/2022   Procedure: UPPER ENDOSCOPIC ULTRASOUND (EUS) RADIAL;  Surgeon: Irving Copas., MD;  Location: [facility] ENDOSCOPY;  Service: Gastroenterology;  Laterality: N/A;   EXCISION OF SKIN TAG  05/03/2022   Procedure: EXCISION OF CHEST WALL SKIN LESION;  Surgeon: Dwan Bolt, MD;  Location: [facility];  Service: General;;   LAPAROSCOPY N/A 05/03/2022   Procedure: LAPAROSCOPY DIAGNOSTIC WITH PERITONEAL WASHINGS;  Surgeon: Dwan Bolt, MD;  Location: [facility];  Service: General;  Laterality: N/A;   POLYPECTOMY  04/19/2022   Procedure: POLYPECTOMY;  Surgeon: Irving Copas., MD;  Location: [facility] ENDOS[facility];  Service: Gastroenterology;;   PORTACATH PLACEMENT N/A 05/03/2022   Procedure: INSERTION PORT-A-CATH WITH ULTRASOUND GUIDANCE;  Surgeon: Dwan Bolt, MD;  Location: [facility];  Service: General;  Laterality: N/A;   UPPER GASTROINTESTINAL ENDOSCOPY      I have reviewed the social history and family history with the patient and they are unchanged from previous note.  ALLERGIES:  is allergic to aspirin, cyclobenzaprine, naproxen sodium, zithromax [azithromycin dihydrate], and dilaudid [hydromorphone].  MEDICATIONS:  Current Outpatient Medications  Medication Sig Dispense Refill   acetaminophen (TYLENOL) 500 MG tablet Take 2 tablets (1,000 mg total) by mouth every 8 (eight) hours as needed (pain). 30 tablet 1   amLODipine (NORVASC) 10 MG tablet TAKE 1 TABLET (10 MG TOTAL) BY MOUTH DAILY. 90 tablet 1   Bacillus Coagulans-Inulin (PROBIOTIC-PREBIOTIC) 1-250 BILLION-MG CAPS Take 2 capsules by mouth daily. 30 capsule 1   Biotin 1000 MCG CHEW Chew 1,000 mcg by mouth daily.     Blood Glucose  Monitoring Suppl (TRUE METRIX METER) w/Device KIT 1 kit by Does not apply route 3 (three) times daily as needed. 1 kit 0   Cholecalciferol (VITAMIN D3) 125 MCG (5000 UT) TABS Take 5,000 Units by mouth daily.     diphenhydrAMINE-prednisoLONE-nystatin in lidocaine solution Take 5 mls by mouth 3 (three) times daily as needed for mouth pain. 140 mL 0   ondansetron (ZOFRAN-ODT) 4 MG disintegrating tablet Take 1 tablet (4 mg total) by mouth every 6 (six) hours. Dissolve one tab on tongue 30 minutes before each dose of bowel prep (Patient not taking: Reported on 04/18/2022) 2 tablet 0   pantoprazole (PROTONIX) 40 MG tablet Take 1 tablet (40 mg total) by mouth 2 (two) times daily. 180 tablet 0   rosuvastatin (CRESTOR) 5 MG tablet Take 1 tablet (5 mg total) by mouth daily. (Patient not taking: Reported on 04/18/2022) 90 tablet 1   sucralfate (CARAFATE) 1 g tablet Take 1 tablet (1 g total) by mouth 2 (two) times daily. 60 tablet 6  Current Facility-Administered Medications  Medication Dose Route Frequency Provider Last Rate Last Admin   0.9 %  sodium chloride infusion  500 mL Intravenous Continuous [place], MD        PHYSICAL EXAMINATION: ECOG PERFORMANCE STATUS: 1 - Symptomatic but completely ambulatory  Vitals:   06/20/22 1051  BP: 128/89  Pulse: 83  Resp: 17  Temp: 98 F (36.7 C)  SpO2: 100%   Wt Readings from Last 3 Encounters:  06/20/22 178 lb 11.2 oz (81.1 kg)  06/06/22 179 lb 6.4 oz (81.4 kg)  05/23/22 181 lb (82.1 kg)       LUNGS: (-)clear to auscultation and percussion with normal breathing effort HEART:(-) regular rate & rhythm and no murmurs and no lower extremity edema  LABORATORY DATA:  I have reviewed the data as listed    Latest Ref Rng & Units 06/20/2022   10:27 AM 06/06/2022    8:46 AM 05/23/2022    7:42 AM  CBC  WBC 4.0 - 10.5 K/uL 13.7  14.5  5.0   Hemoglobin 12.0 - 15.0 g/dL 12.5  12.8  12.7   Hematocrit 36.0 - 46.0 % 35.9  38.0  36.2   Platelets 150 -  400 K/uL 343  306  246         Latest Ref Rng & Units 06/20/2022   10:27 AM 06/06/2022    8:46 AM 05/23/2022    7:42 AM  CMP  Glucose 70 - 99 mg/dL 92  96  88   BUN 6 - 20 mg/dL 14  8  13    Creatinine 0.44 - 1.00 mg/dL 0.62  0.64  0.75   Sodium 135 - 145 mmol/L 139  138  140   Potassium 3.5 - 5.1 mmol/L 3.9  4.1  4.1   Chloride 98 - 111 mmol/L 102  101  105   CO2 22 - 32 mmol/L 31  31  31    Calcium 8.9 - 10.3 mg/dL 9.2  9.4  9.2   Total Protein 6.5 - 8.1 g/dL 7.0  6.6  6.8   Total Bilirubin 0.3 - 1.2 mg/dL 0.4  0.4  0.3   Alkaline Phos 38 - 126 U/L 130  133  100   AST 15 - 41 U/L 17  17  19    ALT 0 - 44 U/L 12  12  18        RADIOGRAPHIC STUDIES: I have personally reviewed the radiological images as listed and agreed with the findings in the report. No results found.    Orders Placed This Encounter  Procedures   CBC with Differential ([facility] Only)    Standing Status:   Future    Standing Expiration Date:   07/05/2023   CMP ([facility] only)    Standing Status:   Future    Standing Expiration Date:   07/05/2023   CBC with Differential ([facility] Only)    Standing Status:   Future    Standing Expiration Date:   07/19/2023   CMP ([facility] only)    Standing Status:   Future    Standing Expiration Date:   07/19/2023   All questions were answered. The patient knows to call the clinic with any problems, questions or concerns. No barriers to learning was detected. The total time spent in the appointment was 40 minutes.     Truitt Merle, MD 06/20/2022   Felicity Coyer, CMA, am acting as scribe for Truitt Merle, MD.   I have  reviewed the above documentation for accuracy and completeness, and I agree with the above.

## 2022-06-19 NOTE — Assessment & Plan Note (Addendum)
-  [id number] with peritoneal metastasis.  -Diagnosed in 03/2022, initial CT scan was negative for metastasis, however exploratory laparoscope showed peritoneal metastasis.   -she has started first line chemo FLOT on 1/10 -I have requested HER2, MMR and FO on her biopsy, results are still pending. PD-L1 0-1%, no significant benefit from PD-L1 immunotherapy, I reviewed with patient and her husband. -She has been tolerating chemo very well, will continue for now  -PET scan from 05/30/2022 was negative for primary tumor or metastatic disease, the known peritoneal mets did not show on PET.  -Foundation One showed no targetable mutations  -She is tolerating chemo very well, will continue -will repeat CT after 6 cycles chemo

## 2022-06-20 ENCOUNTER — Inpatient Hospital Stay: Payer: Commercial Managed Care - HMO | Admitting: Dietician

## 2022-06-20 ENCOUNTER — Other Ambulatory Visit: Payer: Self-pay

## 2022-06-20 ENCOUNTER — Inpatient Hospital Stay: Payer: Commercial Managed Care - HMO

## 2022-06-20 ENCOUNTER — Inpatient Hospital Stay (HOSPITAL_BASED_OUTPATIENT_CLINIC_OR_DEPARTMENT_OTHER): Payer: Commercial Managed Care - HMO | Admitting: Hematology

## 2022-06-20 ENCOUNTER — Encounter: Payer: Self-pay | Admitting: Hematology

## 2022-06-20 VITALS — BP 128/89 | HR 83 | Temp 98.0°F | Resp 17 | Ht 68.0 in | Wt 178.7 lb

## 2022-06-20 DIAGNOSIS — Z5111 Encounter for antineoplastic chemotherapy: Secondary | ICD-10-CM | POA: Diagnosis not present

## 2022-06-20 DIAGNOSIS — C162 Malignant neoplasm of body of stomach: Secondary | ICD-10-CM

## 2022-06-20 DIAGNOSIS — Z95828 Presence of other vascular implants and grafts: Secondary | ICD-10-CM

## 2022-06-20 LAB — CBC WITH DIFFERENTIAL (CANCER CENTER ONLY)
Abs Immature Granulocytes: 0.31 10*3/uL — ABNORMAL HIGH (ref 0.00–0.07)
Basophils Absolute: 0 10*3/uL (ref 0.0–0.1)
Basophils Relative: 0 %
Eosinophils Absolute: 0.1 10*3/uL (ref 0.0–0.5)
Eosinophils Relative: 1 %
HCT: 35.9 % — ABNORMAL LOW (ref 36.0–46.0)
Hemoglobin: 12.5 g/dL (ref 12.0–15.0)
Immature Granulocytes: 2 %
Lymphocytes Relative: 8 %
Lymphs Abs: 1.1 10*3/uL (ref 0.7–4.0)
MCH: 29.6 pg (ref 26.0–34.0)
MCHC: 34.8 g/dL (ref 30.0–36.0)
MCV: 84.9 fL (ref 80.0–100.0)
Monocytes Absolute: 1.1 10*3/uL — ABNORMAL HIGH (ref 0.1–1.0)
Monocytes Relative: 8 %
Neutro Abs: 11.1 10*3/uL — ABNORMAL HIGH (ref 1.7–7.7)
Neutrophils Relative %: 81 %
Platelet Count: 343 10*3/uL (ref 150–400)
RBC: 4.23 MIL/uL (ref 3.87–5.11)
RDW: 16.2 % — ABNORMAL HIGH (ref 11.5–15.5)
WBC Count: 13.7 10*3/uL — ABNORMAL HIGH (ref 4.0–10.5)
nRBC: 0.6 % — ABNORMAL HIGH (ref 0.0–0.2)

## 2022-06-20 LAB — CMP (CANCER CENTER ONLY)
ALT: 12 U/L (ref 0–44)
AST: 17 U/L (ref 15–41)
Albumin: 4 g/dL (ref 3.5–5.0)
Alkaline Phosphatase: 130 U/L — ABNORMAL HIGH (ref 38–126)
Anion gap: 6 (ref 5–15)
BUN: 14 mg/dL (ref 6–20)
CO2: 31 mmol/L (ref 22–32)
Calcium: 9.2 mg/dL (ref 8.9–10.3)
Chloride: 102 mmol/L (ref 98–111)
Creatinine: 0.62 mg/dL (ref 0.44–1.00)
GFR, Estimated: >60 mL/min (ref >60)
Glucose, Bld: 92 mg/dL (ref 70–99)
Potassium: 3.9 mmol/L (ref 3.5–5.1)
Sodium: 139 mmol/L (ref 135–145)
Total Bilirubin: 0.4 mg/dL (ref 0.3–1.2)
Total Protein: 7 g/dL (ref 6.5–8.1)

## 2022-06-20 MED ORDER — SODIUM CHLORIDE 0.9 % IV SOLN
10.0000 mg | Freq: Once | INTRAVENOUS | Status: AC
  Administered 2022-06-20: 10 mg via INTRAVENOUS
  Filled 2022-06-20: qty 10

## 2022-06-20 MED ORDER — SODIUM CHLORIDE 0.9% FLUSH: 10.0000 mL | INTRAVENOUS | Status: DC | PRN

## 2022-06-20 MED ORDER — SODIUM CHLORIDE 0.9% FLUSH
10.0000 mL | Freq: Once | INTRAVENOUS | Status: AC
  Administered 2022-06-20: 10 mL

## 2022-06-20 MED ORDER — SODIUM CHLORIDE 0.9 % IV SOLN
2400.0000 mg/m2 | INTRAVENOUS | Status: DC
  Administered 2022-06-20: 5000 mg via INTRAVENOUS
  Filled 2022-06-20: qty 100

## 2022-06-20 MED ORDER — LEUCOVORIN CALCIUM INJECTION 350 MG
400.0000 mg/m2 | Freq: Once | INTRAVENOUS | Status: AC
  Administered 2022-06-20: 788 mg via INTRAVENOUS
  Filled 2022-06-20: qty 39.4

## 2022-06-20 MED ORDER — PALONOSETRON HCL INJECTION 0.25 MG/5ML
0.2500 mg | Freq: Once | INTRAVENOUS | Status: AC
  Administered 2022-06-20: 0.25 mg via INTRAVENOUS
  Filled 2022-06-20: qty 5

## 2022-06-20 MED ORDER — DEXTROSE 5 % IV SOLN: Freq: Once | INTRAVENOUS | Status: AC

## 2022-06-20 MED ORDER — HEPARIN SOD (PORK) LOCK FLUSH 100 UNIT/ML IV SOLN: 500.0000 [IU] | Freq: Once | INTRAVENOUS | Status: DC | PRN

## 2022-06-20 MED ORDER — OXALIPLATIN CHEMO INJECTION 100 MG/20ML
85.0000 mg/m2 | Freq: Once | INTRAVENOUS | Status: AC
  Administered 2022-06-20: 165 mg via INTRAVENOUS
  Filled 2022-06-20: qty 33

## 2022-06-20 NOTE — Progress Notes (Signed)
Nutrition Follow-up:  Patient with gastric cancer. She is receiving FLOT q14d.   Scheduled to see patient during infusion for nutrition follow-up. Spoke with RN. Patient completed treatment early. Infusion finished prior to nutrition appointment. Per RN, patient tolerating treatment well overall besides diminished taste.    Medications and labs reviewed   Anthropometrics: Wt 178 lb 11.2 oz today stable   2/7 - 179 lb 6.4 oz  1/24 - 181 lb  1/10 - 179 lb 4.8 oz    NEXT VISIT: Wednesday March 6 during infusion

## 2022-06-20 NOTE — Patient Instructions (Signed)
[facility]  Discharge Instructions: Thank you for choosing [facility] to provide your oncology and hematology care.   If you have a lab appointment with the [facility], please go directly to the [facility] and check in at the registration area.   Wear comfortable clothing and clothing appropriate for easy access to any Portacath or PICC line.   We strive to give you quality time with your provider. You may need to reschedule your appointment if you arrive late (15 or more minutes).  Arriving late affects you and other patients whose appointments are after yours.  Also, if you miss three or more appointments without notifying the office, you may be dismissed from the clinic at the provider's discretion.      For prescription refill requests, have your pharmacy contact our office and allow 72 hours for refills to be completed.    Today you received the following chemotherapy and/or immunotherapy agents: Oxaliplatin, Leucovorin, Fluorouracil.       To help prevent nausea and vomiting after your treatment, we encourage you to take your nausea medication as directed.  BELOW ARE SYMPTOMS THAT SHOULD BE REPORTED IMMEDIATELY: *FEVER GREATER THAN 100.4 F (38 C) OR HIGHER *CHILLS OR SWEATING *NAUSEA AND VOMITING THAT IS NOT CONTROLLED WITH YOUR NAUSEA MEDICATION *UNUSUAL SHORTNESS OF BREATH *UNUSUAL BRUISING OR BLEEDING *URINARY PROBLEMS (pain or burning when urinating, or frequent urination) *BOWEL PROBLEMS (unusual diarrhea, constipation, pain near the anus) TENDERNESS IN MOUTH AND THROAT WITH OR WITHOUT PRESENCE OF ULCERS (sore throat, sores in mouth, or a toothache) UNUSUAL RASH, SWELLING OR PAIN  UNUSUAL VAGINAL DISCHARGE OR ITCHING   Items with * indicate a potential emergency and should be followed up as soon as possible or go to the Emergency Department if any problems should occur.  Please show the CHEMOTHERAPY ALERT CARD or  IMMUNOTHERAPY ALERT CARD at check-in to the Emergency Department and triage nurse.  Should you have questions after your visit or need to cancel or reschedule your appointment, please contact [facility]  Dept: [phone]  and follow the prompts.  Office hours are 8:00 a.m. to 4:30 p.m. Monday - Friday. Please note that voicemails left after 4:00 p.m. may not be returned until the following business day.  We are closed weekends and major holidays. You have access to a nurse at all times for urgent questions. Please call the main number to the clinic Dept: [phone] and follow the prompts.   For any non-urgent questions, you may also contact your provider using MyChart. We now offer e-Visits for anyone 19 and older to request care online for non-urgent symptoms. For details visit [email or web address].   Also download the MyChart app! Go to the app store, search "MyChart", open the app, select [facility], and log in with your MyChart username and password.

## 2022-06-21 ENCOUNTER — Other Ambulatory Visit: Payer: Self-pay

## 2022-06-21 ENCOUNTER — Inpatient Hospital Stay: Payer: Commercial Managed Care - HMO

## 2022-06-22 ENCOUNTER — Inpatient Hospital Stay: Payer: Commercial Managed Care - HMO

## 2022-06-22 ENCOUNTER — Other Ambulatory Visit: Payer: Self-pay

## 2022-06-22 VITALS — BP 124/82 | HR 84 | Temp 99.0°F | Resp 16

## 2022-06-22 DIAGNOSIS — C162 Malignant neoplasm of body of stomach: Secondary | ICD-10-CM

## 2022-06-22 DIAGNOSIS — Z5111 Encounter for antineoplastic chemotherapy: Secondary | ICD-10-CM | POA: Diagnosis not present

## 2022-06-22 MED ORDER — SODIUM CHLORIDE 0.9% FLUSH
10.0000 mL | INTRAVENOUS | Status: DC | PRN
  Administered 2022-06-22: 10 mL

## 2022-06-22 MED ORDER — HEPARIN SOD (PORK) LOCK FLUSH 100 UNIT/ML IV SOLN
500.0000 [IU] | Freq: Once | INTRAVENOUS | Status: AC | PRN
  Administered 2022-06-22: 500 [IU]

## 2022-06-26 ENCOUNTER — Other Ambulatory Visit: Payer: Self-pay | Admitting: Internal Medicine

## 2022-06-26 ENCOUNTER — Other Ambulatory Visit: Payer: Self-pay

## 2022-06-26 ENCOUNTER — Other Ambulatory Visit: Payer: Self-pay | Admitting: Gastroenterology

## 2022-06-26 ENCOUNTER — Encounter: Payer: Self-pay | Admitting: Hematology

## 2022-06-26 ENCOUNTER — Other Ambulatory Visit: Payer: Self-pay | Admitting: Hematology

## 2022-06-26 DIAGNOSIS — K219 Gastro-esophageal reflux disease without esophagitis: Secondary | ICD-10-CM

## 2022-06-26 DIAGNOSIS — C162 Malignant neoplasm of body of stomach: Secondary | ICD-10-CM

## 2022-06-26 MED ORDER — VITAMIN D3 125 MCG (5000 UT) PO TABS
5000.0000 [IU] | ORAL_TABLET | Freq: Every day | ORAL | 2 refills | Status: DC
  Filled 2022-06-26: qty 30, 30d supply, fill #0
  Filled 2022-07-02: qty 100, 100d supply, fill #0

## 2022-06-26 MED ORDER — BIOTIN 1000 MCG PO CHEW
1000.0000 ug | CHEWABLE_TABLET | Freq: Every day | ORAL | 2 refills | Status: DC
  Filled 2022-06-26 – 2022-07-02 (×2): qty 30, fill #0

## 2022-06-26 MED ORDER — PROCHLORPERAZINE MALEATE 10 MG PO TABS
10.0000 mg | ORAL_TABLET | Freq: Four times a day (QID) | ORAL | 1 refills | Status: DC | PRN
  Filled 2022-06-26: qty 30, 8d supply, fill #0
  Filled 2022-07-10: qty 30, 8d supply, fill #1

## 2022-06-26 MED ORDER — PANTOPRAZOLE SODIUM 40 MG PO TBEC
40.0000 mg | DELAYED_RELEASE_TABLET | Freq: Two times a day (BID) | ORAL | 0 refills | Status: DC
  Filled 2022-06-26: qty 60, 30d supply, fill #0
  Filled 2022-07-22: qty 60, 30d supply, fill #1
  Filled 2022-08-26 – 2022-08-27 (×2): qty 60, 30d supply, fill #2

## 2022-07-02 NOTE — Assessment & Plan Note (Signed)
-  [id number] with peritoneal metastasis. 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   -she has started first line chemo FLOT on 1/10 -PD-L1 0-1%, no significant benefit from PD-L1 immunotherapy, FO revealed FGFR2 amplification (+), no other targeted therapy available  -She has been tolerating chemo very well, will continue for now  -PET scan from 05/30/2022 was negative for primary tumor or metastatic disease, the known peritoneal mets did not show on PET.  -Foundation One showed no targetable mutations  -she has been tolerating chemo well overall. Due to fatigue, I have changed her chemo from FLOT to FOLFOX on 06/20/2022

## 2022-07-03 ENCOUNTER — Other Ambulatory Visit: Payer: Self-pay

## 2022-07-03 MED FILL — Dexamethasone Sodium Phosphate Inj 100 MG/10ML: INTRAMUSCULAR | Qty: 1 | Status: AC

## 2022-07-03 NOTE — Progress Notes (Unsigned)
[place]   [phone] [phone]   Clinic Follow up Note   Patient Care Team: Janith Lima, MD as PCP - General (Internal Medicine) Truitt Merle, MD as Consulting Physician (Oncology)  Date of Service:  07/04/2022  CHIEF COMPLAINT: f/u of Gastric Cancer     CURRENT THERAPY: FOLFOX q14d X 12 cycles      ASSESSMENT:  Sheryl [name] is a 57 y.o. 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   -she has started first line chemo FLOT on 1/10 -PD-L1 0-1%, no significant benefit from PD-L1 immunotherapy, FO revealed FGFR2 amplification (+), no other targeted therapy available  -She has been tolerating chemo very well, will continue for now  -PET scan from 05/30/2022 was negative for primary tumor or metastatic disease, the known peritoneal mets did not show on PET.  -Foundation One showed no targetable mutations  -she has been tolerating chemo well overall. Due to fatigue, I have changed her chemo from FLOT to FOLFOX on 06/20/2022, she tolerated well -plan to repeat scan in 3-4 weeks  -she asked the role of surgery, depends on her next restaging CT scan findings, I may refer her to [place] to discuss HIPEC surgery    PLAN: -lab reviewed -CT scan in April -proceed  with C2 FOLFOX today -referral to Social Worker [name] about her denial of Medicaid, she wants to appeal -lab/flush, f/u and Folfox 07/18/2022  Addendum -Pt had allergy reaction to oxaliplatin today, with flushing, throat tightness, and shortness of breath.  She received Solu-Medrol, Benadryl and Pepcid, and symptoms resolved.  Oxaliplatin restarted at a slower rate, she tolerated well.  Will add additional premeds for her future infusions, Anturol  oxaliplatin over 4 hours.  SUMMARY OF ONCOLOGIC HISTORY: Oncology History Overview Note   Cancer Staging  Gastric cancer [facility]) Staging form: Stomach, AJCC 8th Edition - Clinical stage from 04/19/2022: Stage IVB (cT2, cN0, pM1) - Signed by Truitt Merle, MD on 05/08/2022 Total positive nodes: 0     Gastric cancer ([place])  03/30/2022 Procedure   EGD:  Impression:  - Normal esophagus. - A few gastric polyps. Biopsied. - Gastritis. Biopsied. - Non-bleeding gastric ulcer with no stigmata of bleeding. Biopsied. - Normal examined duodenum. Biopsied.  Findings: Diffuse moderate inflammation characterized by congestion (edema), friability and granularity was found in the cardia, in the gastric fundus and in the gastric body. There were associated erosions in multiple places. Biopsies were taken from the antrum, body, and fundus with a cold forceps for histology. Estimated blood loss was minimal.  One non-bleeding cratered gastric ulcer with no stigmata of bleeding was found on the greater curvature of the stomach. The lesion was 6 mm in largest dimension. The mucosa around the ulcer was heaped and led to some deformity in the antrum. Biopsies were taken with a cold forceps for histology. Estimated blood loss was minimal.    03/30/2022 Pathology Results   [name]  Accession: [id number]  Diagnosis 1. Surgical [P], duodenal - BENIGN SMALL BOWEL MUCOSA WITH NO SIGNIFICANT PATHOLOGIC CHANGES 2. Surgical [P], gastric antrum - GASTRIC ANTRAL MUCOSA WITH FEATURES OF REACTIVE GASTROPATHY - NEGATIVE FOR H. PYLORI ON H&E STAIN - NEGATIVE FOR INTESTINAL METAPLASIA OR MALIGNANCY 3. Surgical [P], gastric body - GASTRIC OXYNTIC MUCOSA WITH REACTIVE/REPARATIVE  CHANGES - NEGATIVE FOR H. PYLORI ON H&E STAIN - NEGATIVE FOR INTESTINAL METAPLASIA, DYSPLASIA OR MALIGNANCY 4. Surgical [P], greater curve ulceration - ADENOCARCINOMA WITH SIGNET RING CELL FEATURES (SEE NOTE) 5. Surgical [P],  gastric polyps - ADENOCARCINOMA WITH SIGNET RING CELL FEATURES (SEE NOTE) 6. Surgical [P], fundus (gastric) - ADENOCARCINOMA WITH SIGNET RING CELL FEATURES (SEE NOTE) 7. Surgical [P], colon, ascending, polyp (1) - TUBULAR ADENOMA. - NO HIGH GRADE DYSPLASIA OR MALIGNANCY. 8. Surgical [P], colon, transverse, polyp (1) - TUBULAR ADENOMA. - NO HIGH GRADE DYSPLASIA OR MALIGNANCY.    04/13/2022 Initial Diagnosis   Gastric cancer ([place])   04/19/2022 Cancer Staging   Staging form: Stomach, AJCC 8th Edition - Clinical stage from 04/19/2022: Stage IVB (cT2, cN0, pM1) - Signed by Truitt Merle, MD on 05/08/2022 Total positive nodes: 0   05/05/2022 Genetic Testing   Negative genetic testing on the Multi-cancer gene panel + RNA.  FH c.259C>T VUS identified.  The report date is May 05, 2022.  The Multi-Cancer + RNA Panel offered by [facility] includes sequencing and/or deletion/duplication analysis of the following 70 genes:  AIP*, ALK, APC*, ATM*, AXIN2*, BAP1*, BARD1*, BLM*, BMPR1A*, BRCA1*, BRCA2*, BRIP1*, CDC73*, CDH1*, CDK4, CDKN1B*, CDKN2A, CHEK2*, CTNNA1*, DICER1*, EPCAM (del/dup only), EGFR, FH*, FLCN*, GREM1 (promoter dup only), HOXB13, KIT, LZTR1, MAX*, MBD4, MEN1*, MET, MITF, MLH1*, MSH2*, MSH3*, MSH6*, MUTYH*, NF1*, NF2*, NTHL1*, PALB2*, PDGFRA, PMS2*, POLD1*, POLE*, POT1*, PRKAR1A*, PTCH1*, PTEN*, RAD51C*, RAD51D*, RB1*, RET, SDHA* (sequencing only), SDHAF2*, SDHB*, SDHC*, SDHD*, SMAD4*, SMARCA4*, SMARCB1*, SMARCE1*, STK11*, SUFU*, TMEM127*, TP53*, TSC1*, TSC2*, VHL*. RNA analysis is performed for * genes.    05/09/2022 - 06/07/2022 Chemotherapy   Patient is on Treatment Plan : GASTROESOPHAGEAL FLOT q14d X 4 cycles      Miscellaneous   Foundation One  Biomarker Findings Microsatellite status- Cannot be determined Tumor Mutational Burden- Cannot be determined  Genomic Findings  FGFR2 amplification,FGFR2-TACC2 fusion,  Rearrangement intron 17 ARAF amplification CCND3 amplification TP53  [id number]     05/30/2022 Imaging    IMPRESSION: 1. Mild hypermetabolism corresponding to a dominant left upper quadrant mass and smaller perigastric nodules or nodes. 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 Recommend attention to the dominant left upper quadrant soft tissue mass on follow-up exams to exclude unlikely recurrent lymphoma. 2. No gastric hypermetabolism and no typical findings of metastatic disease.   06/20/2022 -  Chemotherapy   Patient is on Treatment Plan : GASTRIC FOLFOX q14d x 12 cycles        INTERVAL HISTORY:  ESELENA ROBARis here for a follow up of  Gastric Cancer   She was last seen by me on 06/20/2022 She presents to the clinic accompanied by father.Pt  state last treatment went good. Pt has question about her diagnoses. Pt state that she slept a lot after the chemo even though it went well.       All other systems were reviewed with the patient and are negative.  MEDICAL HISTORY:  Past Medical History:  Diagnosis Date   Blood transfusion without reported diagnosis    had transfusion with hysterectomy   Cataract    Colon polyps 2012   Diabetes ([place] 03/13/2021   Diabetes ([place] 05/21/2019   Family history of breast cancer    Family history of pancreatic cancer    Family history of stomach cancer    Fibroid    GERD (gastroesophageal reflux disease)    H/O blood clots    History  of hysterectomy    fibroids and heavy cycles   Hypertension     SURGICAL HISTORY: Past Surgical History:  Procedure Laterality Date   ABDOMINAL HYSTERECTOMY     BIOPSY  04/19/2022   Procedure: BIOPSY;  Surgeon: Irving Copas., MD;  Location: [facility];  Service: Gastroenterology;;   COLONOSCOPY     ESOPHAGOGASTRODUODENOSCOPY (EGD) WITH PROPOFOL N/A 04/19/2022   Procedure: ESOPHAGOGASTRODUODENOSCOPY (EGD) WITH PROPOFOL;  Surgeon: Irving Copas., MD;  Location: [facility];  Service: Gastroenterology;   Laterality: N/A;   EUS N/A 04/19/2022   Procedure: UPPER ENDOSCOPIC ULTRASOUND (EUS) RADIAL;  Surgeon: Irving Copas., MD;  Location: [facility];  Service: Gastroenterology;  Laterality: N/A;   EXCISION OF SKIN TAG  05/03/2022   Procedure: EXCISION OF CHEST WALL SKIN LESION;  Surgeon: Dwan Bolt, MD;  Location: [facility];  Service: General;;   LAPAROSCOPY N/A 05/03/2022   Procedure: LAPAROSCOPY DIAGNOSTIC WITH PERITONEAL WASHINGS;  Surgeon: Dwan Bolt, MD;  Location: [facility];  Service: General;  Laterality: N/A;   POLYPECTOMY  04/19/2022   Procedure: POLYPECTOMY;  Surgeon: Irving Copas., MD;  Location: [facility];  Service: Gastroenterology;;   PORTACATH PLACEMENT N/A 05/03/2022   Procedure: INSERTION PORT-A-CATH WITH ULTRASOUND GUIDANCE;  Surgeon: Dwan Bolt, MD;  Location: [facility];  Service: General;  Laterality: N/A;   UPPER GASTROINTESTINAL ENDOSCOPY      I have reviewed the social history and family history with the patient and they are unchanged from previous note.  ALLERGIES:  is allergic to aspirin, cyclobenzaprine, naproxen sodium, zithromax [azithromycin dihydrate], and dilaudid [hydromorphone].  MEDICATIONS:  Current Outpatient Medications  Medication Sig Dispense Refill   acetaminophen (TYLENOL) 500 MG tablet Take 2 tablets (1,000 mg total) by mouth every 8 (eight) hours as needed (pain). 30 tablet 1   amLODipine (NORVASC) 10 MG tablet TAKE 1 TABLET (10 MG TOTAL) BY MOUTH DAILY. 90 tablet 1   Bacillus Coagulans-Inulin (PROBIOTIC-PREBIOTIC) 1-250 BILLION-MG CAPS Take 2 capsules by mouth daily. 30 capsule 1   Biotin 1000 MCG CHEW Chew 1,000 mcg by mouth daily. 30 tablet 2   Blood Glucose Monitoring Suppl (TRUE METRIX METER) w/Device KIT 1 kit by Does not apply route 3 (three) times daily as needed. 1 kit 0   Cholecalciferol (VITAMIN D3) 125 MCG (5000 UT) TABS Take 1 tablet (5,000 Units total) by mouth daily. 30 tablet 2    diphenhydrAMINE-prednisoLONE-nystatin in lidocaine solution Take 5 mls by mouth 3 (three) times daily as needed for mouth pain. 140 mL 0   ondansetron (ZOFRAN-ODT) 4 MG disintegrating tablet Take 1 tablet (4 mg total) by mouth every 6 (six) hours. Dissolve one tab on tongue 30 minutes before each dose of bowel prep (Patient not taking: Reported on 04/18/2022) 2 tablet 0   pantoprazole (PROTONIX) 40 MG tablet Take 1 tablet (40 mg total) by mouth 2 (two) times daily. 180 tablet 0   prochlorperazine (COMPAZINE) 10 MG tablet Take 1 tablet (10 mg total) by mouth every 6 (six) hours as needed for nausea or vomiting. 30 tablet 1   rosuvastatin (CRESTOR) 5 MG tablet Take 1 tablet (5 mg total) by mouth daily. (Patient not taking: Reported on 04/18/2022) 90 tablet 1   sucralfate (CARAFATE) 1 g tablet Take 1 tablet (1 g total) by mouth 2 (two) times daily. 60 tablet 6   Current Facility-Administered Medications  Medication Dose Route Frequency Provider Last Rate Last Admin   0.9 %  sodium chloride infusion  500 mL  Intravenous Continuous [place], MD       Facility-Administered Medications Ordered in Other Visits  Medication Dose Route Frequency Provider Last Rate Last Admin   fluorouracil (ADRUCIL) 5,000 mg in sodium chloride 0.9 % 150 mL chemo infusion  2,400 mg/m2 (Treatment Plan Recorded) Intravenous 1 day or 1 dose Truitt Merle, MD       heparin lock flush 100 unit/mL  500 Units Intracatheter Once PRN Truitt Merle, MD       leucovorin 788 mg in dextrose 5 % 250 mL infusion  400 mg/m2 (Treatment Plan Recorded) Intravenous Once Truitt Merle, MD       oxaliplatin (ELOXATIN) 165 mg in dextrose 5 % 500 mL chemo infusion  85 mg/m2 (Treatment Plan Recorded) Intravenous Once Truitt Merle, MD       sodium chloride flush (NS) 0.9 % injection 10 mL  10 mL Intracatheter PRN Truitt Merle, MD        PHYSICAL EXAMINATION: ECOG PERFORMANCE STATUS: 1 - Symptomatic but completely ambulatory  Vitals:   07/04/22 0915  BP:  124/81  Pulse: 72  Resp: 15  Temp: 98 F (36.7 C)  SpO2: 100%   Wt Readings from Last 3 Encounters:  07/04/22 184 lb 8 oz (83.7 kg)  06/20/22 178 lb 11.2 oz (81.1 kg)  06/06/22 179 lb 6.4 oz (81.4 kg)     GENERAL:alert, no distress and comfortable SKIN: skin color normal, no rashes or significant lesions EYES: normal, Conjunctiva are pink and non-injected, sclera clear  NEURO: alert & oriented x 3 with fluent speech   LABORATORY DATA:  I have reviewed the data as listed    Latest Ref Rng & Units 07/04/2022    8:56 AM 06/20/2022   10:27 AM 06/06/2022    8:46 AM  CBC  WBC 4.0 - 10.5 K/uL 4.7  13.7  14.5   Hemoglobin 12.0 - 15.0 g/dL 12.0  12.5  12.8   Hematocrit 36.0 - 46.0 % 34.6  35.9  38.0   Platelets 150 - 400 K/uL 275  343  306         Latest Ref Rng & Units 07/04/2022    8:56 AM 06/20/2022   10:27 AM 06/06/2022    8:46 AM  CMP  Glucose 70 - 99 mg/dL 103  92  96   BUN 6 - 20 mg/dL '12  14  8   '$ Creatinine 0.44 - 1.00 mg/dL 0.61  0.62  0.64   Sodium 135 - 145 mmol/L 138  139  138   Potassium 3.5 - 5.1 mmol/L 3.8  3.9  4.1   Chloride 98 - 111 mmol/L 103  102  101   CO2 22 - 32 mmol/L '29  31  31   '$ Calcium 8.9 - 10.3 mg/dL 9.4  9.2  9.4   Total Protein 6.5 - 8.1 g/dL 6.9  7.0  6.6   Total Bilirubin 0.3 - 1.2 mg/dL 0.7  0.4  0.4   Alkaline Phos 38 - 126 U/L 91  130  133   AST 15 - 41 U/L '22  17  17   '$ ALT 0 - 44 U/L '16  12  12       '$ RADIOGRAPHIC STUDIES: I have personally reviewed the radiological images as listed and agreed with the findings in the report. No results found.    Orders Placed This Encounter  Procedures   CBC with Differential ([place] Only)    Standing Status:   Future  Standing Expiration Date:   08/01/2023   CMP ([place] only)    Standing Status:   Future    Standing Expiration Date:   08/01/2023   CBC with Differential ([facility] Only)    Standing Status:   Future    Standing Expiration Date:   08/15/2023   CMP ([place]  only)    Standing Status:   Future    Standing Expiration Date:   08/15/2023   CBC with Differential ([facility] Only)    Standing Status:   Future    Standing Expiration Date:   08/29/2023   CMP ([place] only)    Standing Status:   Future    Standing Expiration Date:   08/29/2023   Ambulatory referral to Social Work    Referral Priority:   Routine    Referral Type:   Consultation    Referral Reason:   Specialty Services Required    Number of Visits Requested:   1   All questions were answered. The patient knows to call the clinic with any problems, questions or concerns. No barriers to learning was detected. The total time spent in the appointment was 30 minutes.     Truitt Merle, MD 07/04/2022   Felicity Coyer, CMA, am acting as scribe for Truitt Merle, MD.   I have reviewed the above documentation for accuracy and completeness, and I agree with the above.

## 2022-07-04 ENCOUNTER — Inpatient Hospital Stay: Payer: Commercial Managed Care - HMO | Attending: Physician Assistant | Admitting: Hematology

## 2022-07-04 ENCOUNTER — Inpatient Hospital Stay: Payer: Commercial Managed Care - HMO

## 2022-07-04 ENCOUNTER — Encounter: Payer: Self-pay | Admitting: Hematology

## 2022-07-04 ENCOUNTER — Inpatient Hospital Stay: Payer: Commercial Managed Care - HMO | Admitting: Physician Assistant

## 2022-07-04 ENCOUNTER — Other Ambulatory Visit: Payer: Self-pay

## 2022-07-04 ENCOUNTER — Inpatient Hospital Stay: Payer: Commercial Managed Care - HMO | Admitting: Dietician

## 2022-07-04 ENCOUNTER — Ambulatory Visit (HOSPITAL_BASED_OUTPATIENT_CLINIC_OR_DEPARTMENT_OTHER): Payer: Commercial Managed Care - HMO | Admitting: Physician Assistant

## 2022-07-04 VITALS — BP 124/81 | HR 72 | Temp 98.0°F | Resp 15 | Ht 68.0 in | Wt 184.5 lb

## 2022-07-04 VITALS — BP 127/89 | HR 90 | Temp 98.7°F | Resp 16

## 2022-07-04 DIAGNOSIS — C162 Malignant neoplasm of body of stomach: Secondary | ICD-10-CM

## 2022-07-04 DIAGNOSIS — Z95828 Presence of other vascular implants and grafts: Secondary | ICD-10-CM

## 2022-07-04 DIAGNOSIS — C786 Secondary malignant neoplasm of retroperitoneum and peritoneum: Secondary | ICD-10-CM | POA: Diagnosis not present

## 2022-07-04 DIAGNOSIS — C169 Malignant neoplasm of stomach, unspecified: Secondary | ICD-10-CM | POA: Diagnosis not present

## 2022-07-04 DIAGNOSIS — Z5111 Encounter for antineoplastic chemotherapy: Secondary | ICD-10-CM | POA: Diagnosis present

## 2022-07-04 DIAGNOSIS — T451X5A Adverse effect of antineoplastic and immunosuppressive drugs, initial encounter: Secondary | ICD-10-CM

## 2022-07-04 LAB — CMP (CANCER CENTER ONLY)
ALT: 16 U/L (ref 0–44)
AST: 22 U/L (ref 15–41)
Albumin: 4.1 g/dL (ref 3.5–5.0)
Alkaline Phosphatase: 91 U/L (ref 38–126)
Anion gap: 6 (ref 5–15)
BUN: 12 mg/dL (ref 6–20)
CO2: 29 mmol/L (ref 22–32)
Calcium: 9.4 mg/dL (ref 8.9–10.3)
Chloride: 103 mmol/L (ref 98–111)
Creatinine: 0.61 mg/dL (ref 0.44–1.00)
GFR, Estimated: >60 mL/min (ref >60)
Glucose, Bld: 103 mg/dL — ABNORMAL HIGH (ref 70–99)
Potassium: 3.8 mmol/L (ref 3.5–5.1)
Sodium: 138 mmol/L (ref 135–145)
Total Bilirubin: 0.7 mg/dL (ref 0.3–1.2)
Total Protein: 6.9 g/dL (ref 6.5–8.1)

## 2022-07-04 LAB — CBC WITH DIFFERENTIAL (CANCER CENTER ONLY)
Abs Immature Granulocytes: 0.01 10*3/uL (ref 0.00–0.07)
Basophils Absolute: 0.1 10*3/uL (ref 0.0–0.1)
Basophils Relative: 1 %
Eosinophils Absolute: 0.3 10*3/uL (ref 0.0–0.5)
Eosinophils Relative: 5 %
HCT: 34.6 % — ABNORMAL LOW (ref 36.0–46.0)
Hemoglobin: 12 g/dL (ref 12.0–15.0)
Immature Granulocytes: 0 %
Lymphocytes Relative: 17 %
Lymphs Abs: 0.8 10*3/uL (ref 0.7–4.0)
MCH: 29.6 pg (ref 26.0–34.0)
MCHC: 34.7 g/dL (ref 30.0–36.0)
MCV: 85.2 fL (ref 80.0–100.0)
Monocytes Absolute: 0.6 10*3/uL (ref 0.1–1.0)
Monocytes Relative: 13 %
Neutro Abs: 3 10*3/uL (ref 1.7–7.7)
Neutrophils Relative %: 64 %
Platelet Count: 275 10*3/uL (ref 150–400)
RBC: 4.06 MIL/uL (ref 3.87–5.11)
RDW: 17.1 % — ABNORMAL HIGH (ref 11.5–15.5)
WBC Count: 4.7 10*3/uL (ref 4.0–10.5)
nRBC: 0 % (ref 0.0–0.2)

## 2022-07-04 MED ORDER — FAMOTIDINE IN NACL 20-0.9 MG/50ML-% IV SOLN
20.0000 mg | Freq: Once | INTRAVENOUS | Status: AC | PRN
  Administered 2022-07-04: 20 mg via INTRAVENOUS

## 2022-07-04 MED ORDER — SODIUM CHLORIDE 0.9 % IV SOLN
10.0000 mg | Freq: Once | INTRAVENOUS | Status: AC
  Administered 2022-07-04: 10 mg via INTRAVENOUS
  Filled 2022-07-04: qty 10

## 2022-07-04 MED ORDER — DEXTROSE 5 % IV SOLN: Freq: Once | INTRAVENOUS | Status: AC

## 2022-07-04 MED ORDER — HEPARIN SOD (PORK) LOCK FLUSH 100 UNIT/ML IV SOLN: 500.0000 [IU] | Freq: Once | INTRAVENOUS | Status: DC | PRN

## 2022-07-04 MED ORDER — DIPHENHYDRAMINE HCL 50 MG/ML IJ SOLN
50.0000 mg | Freq: Once | INTRAMUSCULAR | Status: AC | PRN
  Administered 2022-07-04: 50 mg via INTRAVENOUS

## 2022-07-04 MED ORDER — SODIUM CHLORIDE 0.9% FLUSH
10.0000 mL | Freq: Once | INTRAVENOUS | Status: AC
  Administered 2022-07-04: 10 mL

## 2022-07-04 MED ORDER — METHYLPREDNISOLONE SODIUM SUCC 125 MG IJ SOLR
125.0000 mg | Freq: Once | INTRAMUSCULAR | Status: AC | PRN
  Administered 2022-07-04: 125 mg via INTRAVENOUS

## 2022-07-04 MED ORDER — LEUCOVORIN CALCIUM INJECTION 350 MG
400.0000 mg/m2 | Freq: Once | INTRAVENOUS | Status: AC
  Administered 2022-07-04: 788 mg via INTRAVENOUS
  Filled 2022-07-04: qty 39.4

## 2022-07-04 MED ORDER — OXALIPLATIN CHEMO INJECTION 100 MG/20ML
85.0000 mg/m2 | Freq: Once | INTRAVENOUS | Status: AC
  Administered 2022-07-04: 165 mg via INTRAVENOUS
  Filled 2022-07-04: qty 33

## 2022-07-04 MED ORDER — PALONOSETRON HCL INJECTION 0.25 MG/5ML
0.2500 mg | Freq: Once | INTRAVENOUS | Status: AC
  Administered 2022-07-04: 0.25 mg via INTRAVENOUS
  Filled 2022-07-04: qty 5

## 2022-07-04 MED ORDER — SODIUM CHLORIDE 0.9% FLUSH: 10.0000 mL | INTRAVENOUS | Status: DC | PRN

## 2022-07-04 MED ORDER — SODIUM CHLORIDE 0.9 % IV SOLN
2400.0000 mg/m2 | INTRAVENOUS | Status: DC
  Administered 2022-07-04: 5000 mg via INTRAVENOUS
  Filled 2022-07-04: qty 100

## 2022-07-04 NOTE — Progress Notes (Signed)
Hypersensitivity Reaction note  Date of event: 07/04/22 Time of event: 11 :11 Generic name of drug involved: Oxaliplatin Name of provider notified of the hypersensitivity reaction: Eloise Harman. PA and Dr. Burr Medico Was agent that likely caused hypersensitivity reaction added to Allergies List within EMR? yes Chain of events including reaction signs/symptoms, treatment administered, and outcome (e.g., drug resumed; drug discontinued; sent to Emergency Department; etc.) Vinnie Level, dietician, was working with the patient and she stated to Vinnie Level that her throat felt scratchy and she felt nauseas. As Vinnie Level was reporting the patient symptoms, this RN noticed that the patient was flushed and diaphoretic. The Oxaliplatin was stopped and D5 was hung to gravity. Pepcid 20 mg IV administered, Solumedrol '125mg'$ , and Benadryl 25 mg administered. Anda Kraft PA at the chairside assessing patient. See MAR for admin times. Dr. Burr Medico a chairside at 11:30, assessed and spoke with patient. VO from Dr. Burr Medico to restrt the Oxaliplatin at 50% of the rate. Restarted Oxaliplatin/leucovorin at 11:56 to run over 4 hours. Patient tolerating well.  Scot Dock, RN 07/04/2022 12:46 PM

## 2022-07-04 NOTE — Progress Notes (Signed)
    DATE:  07/04/22                                        X CHEMO/IMMUNOTHERAPY REACTION             MD: Burr Medico   AGENT/BLOOD PRODUCT RECEIVING TODAY:              Oxaliplatin, docetaxel, fluorouracil, leucovorin   AGENT/BLOOD PRODUCT RECEIVING IMMEDIATELY PRIOR TO REACTION:          oxaliplatin   VS: BP:     128/91   P:       120       SPO2:       99% RA                BP:     115/79   P:       110       SPO2:       99% RA     REACTION(S):           diaphoresis, flushing, throat tightness   PREMEDS:     aloxi 0.25 mg IV and decadron 10 mg IV   INTERVENTION: Pepcid 20 mg IV, solu-medrol 125 mg IV, benadryl 25 mg IV   Review of Systems  Review of Systems  Constitutional:  Positive for diaphoresis.  HENT:  Positive for trouble swallowing.   Skin:  Positive for color change.  All other systems reviewed and are negative.    Physical Exam  Physical Exam Vitals and nursing note reviewed.  Constitutional:      General: She is in acute distress.     Appearance: She is well-developed. She is diaphoretic. She is not ill-appearing or toxic-appearing.  HENT:     Head: Normocephalic.     Nose: Nose normal.  Eyes:     Conjunctiva/sclera: Conjunctivae normal.  Neck:     Vascular: No JVD.  Cardiovascular:     Rate and Rhythm: Normal rate and regular rhythm.     Pulses: Normal pulses.     Heart sounds: Normal heart sounds.  Pulmonary:     Effort: Pulmonary effort is normal.     Breath sounds: Normal breath sounds.  Abdominal:     General: There is no distension.  Musculoskeletal:     Cervical back: Normal range of motion.  Skin:    General: Skin is warm.     Findings: Erythema present.  Neurological:     Mental Status: She is oriented to person, place, and time.     OUTCOME:         Patient with allergic reaction during 5th oxali treatment.  Symptoms resolved with emergency medications as given above. Patient returned to baseline. Dr. Burr Medico evaluated patient and plan is  to resume infusion at half rate over longer time. Patient tolerated remainder of treatment.

## 2022-07-04 NOTE — Patient Instructions (Signed)
[facility]  Discharge Instructions: Thank you for choosing [facility] to provide your oncology and hematology care.   If you have a lab appointment with the [facility], please go directly to the [facility] and check in at the registration area.   Wear comfortable clothing and clothing appropriate for easy access to any Portacath or PICC line.   We strive to give you quality time with your provider. You may need to reschedule your appointment if you arrive late (15 or more minutes).  Arriving late affects you and other patients whose appointments are after yours.  Also, if you miss three or more appointments without notifying the office, you may be dismissed from the clinic at the provider's discretion.      For prescription refill requests, have your pharmacy contact our office and allow 72 hours for refills to be completed.    Today you received the following chemotherapy and/or immunotherapy agents: Oxaliplatin, Leucovorin, 5FU      To help prevent nausea and vomiting after your treatment, we encourage you to take your nausea medication as directed.  BELOW ARE SYMPTOMS THAT SHOULD BE REPORTED IMMEDIATELY: *FEVER GREATER THAN 100.4 F (38 C) OR HIGHER *CHILLS OR SWEATING *NAUSEA AND VOMITING THAT IS NOT CONTROLLED WITH YOUR NAUSEA MEDICATION *UNUSUAL SHORTNESS OF BREATH *UNUSUAL BRUISING OR BLEEDING *URINARY PROBLEMS (pain or burning when urinating, or frequent urination) *BOWEL PROBLEMS (unusual diarrhea, constipation, pain near the anus) TENDERNESS IN MOUTH AND THROAT WITH OR WITHOUT PRESENCE OF ULCERS (sore throat, sores in mouth, or a toothache) UNUSUAL RASH, SWELLING OR PAIN  UNUSUAL VAGINAL DISCHARGE OR ITCHING   Items with * indicate a potential emergency and should be followed up as soon as possible or go to the Emergency Department if any problems should occur.  Please show the CHEMOTHERAPY ALERT CARD or IMMUNOTHERAPY  ALERT CARD at check-in to the Emergency Department and triage nurse.  Should you have questions after your visit or need to cancel or reschedule your appointment, please contact [facility]  Dept: [phone]  and follow the prompts.  Office hours are 8:00 a.m. to 4:30 p.m. Monday - Friday. Please note that voicemails left after 4:00 p.m. may not be returned until the following business day.  We are closed weekends and major holidays. You have access to a nurse at all times for urgent questions. Please call the main number to the clinic Dept: [phone] and follow the prompts.   For any non-urgent questions, you may also contact your provider using MyChart. We now offer e-Visits for anyone 62 and older to request care online for non-urgent symptoms. For details visit [email or web address].   Also download the MyChart app! Go to the app store, search "MyChart", open the app, select [facility], and log in with your MyChart username and password.

## 2022-07-04 NOTE — Progress Notes (Unsigned)
Nutrition Follow-up:  Patient with gastric cancer. She is receiving neoadjuvant FOLFOX q14d  Met with patient in infusion. She reports appetite comes and goes. Patient endorses fatigue and poor appetite lasting one week after infusion. She is not getting out of bed during that time except to go to the bathroom. Patient does not keep snacks in the bedroom. Looking at food makes her feel nauseas. She is taking nausea medication. This works well for her. Patient reports appetite picks up and eats well the second week. Patient has not been drinking Ensure lately. She is agreeable to drink these when appetite is poor. During visit patient reported that her mouth was beginning to "feel funny" and became nauseas. Infusion RN notified. Patient having reaction to oxaliplatin. She was seen by NP and MD. Patient stable s/p steroids + benadryl. Checked on patient later in the afternoon. She was feeling "great."  Medications: reviewed  Labs: reviewed   Anthropometrics: Wt 184 lb 8 oz  today increased   2/21 - 178 lb 11.2 oz 1/24 - 181 lb    NUTRITION DIAGNOSIS: Unintended wt loss improved    INTERVENTION:  Reinforced importance of adequate energy intake to maintain strength - handout with ideas provided  Encouraged drinking Ensure for calories and protein when appetite is poor Discussed strategies for nausea Reviewed tips for altered taste, suggested baking soda salt water rinses several times daily before meals  Continue nausea medication as prescribed  One complimentary case Ensure Plus HP     MONITORING, EVALUATION, GOAL: weight trends, intake   NEXT VISIT: Wednesday March 20 during infusion

## 2022-07-04 NOTE — Addendum Note (Signed)
Addended by: Truitt Merle on: 07/04/2022 04:58 PM   Modules accepted: Orders

## 2022-07-05 ENCOUNTER — Inpatient Hospital Stay: Payer: Commercial Managed Care - HMO | Admitting: Licensed Clinical Social Worker

## 2022-07-05 ENCOUNTER — Encounter: Payer: Self-pay | Admitting: Hematology

## 2022-07-05 DIAGNOSIS — C162 Malignant neoplasm of body of stomach: Secondary | ICD-10-CM

## 2022-07-05 NOTE — Progress Notes (Signed)
[facility] CSW Progress Note  Clinical Education officer, museum  informed by pt she received a letter for social security that she has been denied for disability.  Pt states she was also denied for SSI due to assets.  Medicaid is still pending.  Pt reports she is in the process of selling one of her cars and has hired a Chief Executive Officer to assist with appealing the [place] denial.  CSW sent an email to the [facility] to inquire about the most appropriate course of action to appeal the social security disability claim.  CSW to inform pt of [facility]'s advise once received.        Henriette Combs, LCSW

## 2022-07-06 ENCOUNTER — Inpatient Hospital Stay: Payer: Commercial Managed Care - HMO

## 2022-07-06 VITALS — BP 128/84 | HR 90 | Temp 98.9°F | Resp 16

## 2022-07-06 DIAGNOSIS — C162 Malignant neoplasm of body of stomach: Secondary | ICD-10-CM

## 2022-07-06 DIAGNOSIS — Z5111 Encounter for antineoplastic chemotherapy: Secondary | ICD-10-CM | POA: Diagnosis not present

## 2022-07-06 MED ORDER — SODIUM CHLORIDE 0.9% FLUSH
10.0000 mL | INTRAVENOUS | Status: DC | PRN
  Administered 2022-07-06: 10 mL

## 2022-07-06 MED ORDER — HEPARIN SOD (PORK) LOCK FLUSH 100 UNIT/ML IV SOLN
500.0000 [IU] | Freq: Once | INTRAVENOUS | Status: AC | PRN
  Administered 2022-07-06: 500 [IU]

## 2022-07-12 ENCOUNTER — Other Ambulatory Visit (HOSPITAL_COMMUNITY): Payer: Self-pay

## 2022-07-17 MED FILL — Dexamethasone Sodium Phosphate Inj 100 MG/10ML: INTRAMUSCULAR | Qty: 1 | Status: AC

## 2022-07-17 NOTE — Assessment & Plan Note (Addendum)
-  [id number] with peritoneal metastasis. 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   -she has started first line chemo FLOT on 1/10 -PD-L1 0-1%, no significant benefit from PD-L1 immunotherapy, FO revealed FGFR2 amplification (+), no other targeted therapy available  -She has been tolerating chemo very well, will continue for now  -PET scan from 05/30/2022 was negative for primary tumor or metastatic disease, the known peritoneal mets did not show on PET.  -Foundation One showed no targetable mutations  -she has been tolerating chemo well overall. Due to fatigue, I have changed her chemo from FLOT to FOLFOX on 06/20/2022, she tolerated well -plan to repeat scan in 3-4 weeks  -she asked the role of surgery, depends on her next restaging CT scan findings, I may refer her to [facility] to discuss HIPEC surgery  -due to her infusion reaction to oxaliplatin on C5, will add additional premeds and give slow infusion over 4 hours with close monitoring

## 2022-07-18 ENCOUNTER — Inpatient Hospital Stay: Payer: Commercial Managed Care - HMO | Admitting: Dietician

## 2022-07-18 ENCOUNTER — Inpatient Hospital Stay (HOSPITAL_BASED_OUTPATIENT_CLINIC_OR_DEPARTMENT_OTHER): Payer: Commercial Managed Care - HMO | Admitting: Hematology

## 2022-07-18 ENCOUNTER — Inpatient Hospital Stay: Payer: Commercial Managed Care - HMO

## 2022-07-18 ENCOUNTER — Other Ambulatory Visit: Payer: Self-pay | Admitting: Hematology

## 2022-07-18 ENCOUNTER — Encounter: Payer: Self-pay | Admitting: Hematology

## 2022-07-18 VITALS — BP 135/98 | HR 88 | Temp 98.0°F | Resp 15

## 2022-07-18 VITALS — BP 130/89 | HR 98 | Temp 98.3°F | Resp 15 | Ht 68.0 in | Wt 187.1 lb

## 2022-07-18 DIAGNOSIS — D500 Iron deficiency anemia secondary to blood loss (chronic): Secondary | ICD-10-CM | POA: Diagnosis not present

## 2022-07-18 DIAGNOSIS — Z95828 Presence of other vascular implants and grafts: Secondary | ICD-10-CM

## 2022-07-18 DIAGNOSIS — C162 Malignant neoplasm of body of stomach: Secondary | ICD-10-CM

## 2022-07-18 DIAGNOSIS — Z5111 Encounter for antineoplastic chemotherapy: Secondary | ICD-10-CM | POA: Diagnosis not present

## 2022-07-18 LAB — CBC WITH DIFFERENTIAL (CANCER CENTER ONLY)
Abs Immature Granulocytes: 0.01 10*3/uL (ref 0.00–0.07)
Basophils Absolute: 0 10*3/uL (ref 0.0–0.1)
Basophils Relative: 1 %
Eosinophils Absolute: 0.1 10*3/uL (ref 0.0–0.5)
Eosinophils Relative: 4 %
HCT: 37.7 % (ref 36.0–46.0)
Hemoglobin: 12.8 g/dL (ref 12.0–15.0)
Immature Granulocytes: 0 %
Lymphocytes Relative: 19 %
Lymphs Abs: 0.7 10*3/uL (ref 0.7–4.0)
MCH: 29.6 pg (ref 26.0–34.0)
MCHC: 34 g/dL (ref 30.0–36.0)
MCV: 87.1 fL (ref 80.0–100.0)
Monocytes Absolute: 0.5 10*3/uL (ref 0.1–1.0)
Monocytes Relative: 14 %
Neutro Abs: 2.1 10*3/uL (ref 1.7–7.7)
Neutrophils Relative %: 62 %
Platelet Count: 237 10*3/uL (ref 150–400)
RBC: 4.33 MIL/uL (ref 3.87–5.11)
RDW: 16.5 % — ABNORMAL HIGH (ref 11.5–15.5)
WBC Count: 3.4 10*3/uL — ABNORMAL LOW (ref 4.0–10.5)
nRBC: 0 % (ref 0.0–0.2)

## 2022-07-18 LAB — CMP (CANCER CENTER ONLY)
ALT: 16 U/L (ref 0–44)
AST: 23 U/L (ref 15–41)
Albumin: 4.3 g/dL (ref 3.5–5.0)
Alkaline Phosphatase: 107 U/L (ref 38–126)
Anion gap: 8 (ref 5–15)
BUN: 12 mg/dL (ref 6–20)
CO2: 26 mmol/L (ref 22–32)
Calcium: 9.4 mg/dL (ref 8.9–10.3)
Chloride: 105 mmol/L (ref 98–111)
Creatinine: 0.59 mg/dL (ref 0.44–1.00)
GFR, Estimated: >60 mL/min (ref >60)
Glucose, Bld: 147 mg/dL — ABNORMAL HIGH (ref 70–99)
Potassium: 3.5 mmol/L (ref 3.5–5.1)
Sodium: 139 mmol/L (ref 135–145)
Total Bilirubin: 0.8 mg/dL (ref 0.3–1.2)
Total Protein: 7.1 g/dL (ref 6.5–8.1)

## 2022-07-18 MED ORDER — SODIUM CHLORIDE 0.9% FLUSH
10.0000 mL | Freq: Once | INTRAVENOUS | Status: AC
  Administered 2022-07-18: 10 mL

## 2022-07-18 MED ORDER — SODIUM CHLORIDE 0.9 % IV SOLN
2400.0000 mg/m2 | INTRAVENOUS | Status: DC
  Administered 2022-07-18: 5000 mg via INTRAVENOUS
  Filled 2022-07-18: qty 100

## 2022-07-18 MED ORDER — LEUCOVORIN CALCIUM INJECTION 350 MG
400.0000 mg/m2 | Freq: Once | INTRAVENOUS | Status: AC
  Administered 2022-07-18: 788 mg via INTRAVENOUS
  Filled 2022-07-18: qty 25

## 2022-07-18 MED ORDER — OXALIPLATIN CHEMO INJECTION 100 MG/20ML
85.0000 mg/m2 | Freq: Once | INTRAVENOUS | Status: AC
  Administered 2022-07-18: 165 mg via INTRAVENOUS
  Filled 2022-07-18: qty 33

## 2022-07-18 MED ORDER — DIPHENHYDRAMINE HCL 50 MG/ML IJ SOLN
50.0000 mg | Freq: Once | INTRAMUSCULAR | Status: AC
  Administered 2022-07-18: 50 mg via INTRAVENOUS
  Filled 2022-07-18: qty 1

## 2022-07-18 MED ORDER — FAMOTIDINE IN NACL 20-0.9 MG/50ML-% IV SOLN
20.0000 mg | Freq: Once | INTRAVENOUS | Status: AC
  Administered 2022-07-18: 20 mg via INTRAVENOUS
  Filled 2022-07-18: qty 50

## 2022-07-18 MED ORDER — SODIUM CHLORIDE 0.9 % IV SOLN
10.0000 mg | Freq: Once | INTRAVENOUS | Status: AC
  Administered 2022-07-18: 10 mg via INTRAVENOUS
  Filled 2022-07-18: qty 10

## 2022-07-18 MED ORDER — DEXTROSE 5 % IV SOLN: Freq: Once | INTRAVENOUS | Status: AC

## 2022-07-18 MED ORDER — PALONOSETRON HCL INJECTION 0.25 MG/5ML
0.2500 mg | Freq: Once | INTRAVENOUS | Status: AC
  Administered 2022-07-18: 0.25 mg via INTRAVENOUS
  Filled 2022-07-18: qty 5

## 2022-07-18 NOTE — Patient Instructions (Signed)
[facility]  Discharge Instructions: Thank you for choosing [facility] to provide your oncology and hematology care.   If you have a lab appointment with the Cancer Center, please go directly to the Cancer Center and check in at the registration area.   Wear comfortable clothing and clothing appropriate for easy access to any Portacath or PICC line.   We strive to give you quality time with your provider. You may need to reschedule your appointment if you arrive late (15 or more minutes).  Arriving late affects you and other patients whose appointments are after yours.  Also, if you miss three or more appointments without notifying the office, you may be dismissed from the clinic at the provider's discretion.      For prescription refill requests, have your pharmacy contact our office and allow 72 hours for refills to be completed.    Today you received the following chemotherapy and/or immunotherapy agents: Oxaliplatin, Leucovorin, 5FU      To help prevent nausea and vomiting after your treatment, we encourage you to take your nausea medication as directed.  BELOW ARE SYMPTOMS THAT SHOULD BE REPORTED IMMEDIATELY: *FEVER GREATER THAN 100.4 F (38 C) OR HIGHER *CHILLS OR SWEATING *NAUSEA AND VOMITING THAT IS NOT CONTROLLED WITH YOUR NAUSEA MEDICATION *UNUSUAL SHORTNESS OF BREATH *UNUSUAL BRUISING OR BLEEDING *URINARY PROBLEMS (pain or burning when urinating, or frequent urination) *BOWEL PROBLEMS (unusual diarrhea, constipation, pain near the anus) TENDERNESS IN MOUTH AND THROAT WITH OR WITHOUT PRESENCE OF ULCERS (sore throat, sores in mouth, or a toothache) UNUSUAL RASH, SWELLING OR PAIN  UNUSUAL VAGINAL DISCHARGE OR ITCHING   Items with * indicate a potential emergency and should be followed up as soon as possible or go to the Emergency Department if any problems should occur.  Please show the CHEMOTHERAPY ALERT CARD or IMMUNOTHERAPY  ALERT CARD at check-in to the Emergency Department and triage nurse.  Should you have questions after your visit or need to cancel or reschedule your appointment, please contact [facility]  Dept: [phone]  and follow the prompts.  Office hours are 8:00 a.m. to 4:30 p.m. Monday - Friday. Please note that voicemails left after 4:00 p.m. may not be returned until the following business day.  We are closed weekends and major holidays. You have access to a nurse at all times for urgent questions. Please call the main number to the clinic Dept: [phone] and follow the prompts.   For any non-urgent questions, you may also contact your provider using MyChart. We now offer e-Visits for anyone 18 and older to request care online for non-urgent symptoms. For details visit [email or web address].   Also download the MyChart app! Go to the app store, search "MyChart", open the app, select [facility], and log in with your MyChart username and password.   

## 2022-07-18 NOTE — Progress Notes (Deleted)
Nutrition Follow-up:  Patient with gastric cancer. She is receiving neoadjuvant FOLFOX q14d      Medications: reviewed  Labs: glucose 147  Anthropometrics: Wt 187 lb 1.6 oz today increased   3/6 - 184 lb 8 oz  2/21 - 178 lb 11.2 oz 1/24 - 181 lb     NUTRITION DIAGNOSIS: ***      INTERVENTION: ***    MONITORING, EVALUATION, GOAL: weight trends, intake   NEXT VISIT: ***

## 2022-07-18 NOTE — Progress Notes (Signed)
[facility]   Telephone:(336) [phone] Fax:(336) [phone]   Clinic Follow up Note   Patient Care Team: Sheryl Lima, MD as PCP - General (Internal Medicine) Sheryl Merle, MD as Consulting Physician (Oncology)  Date of Service:  07/18/2022  CHIEF COMPLAINT: f/u of  Gastric Cancer   CURRENT THERAPY:  FOLFOX q14d X 12 cycles     ASSESSMENT:  Sheryl [name] is a 57 y.o. 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   -she has started first line chemo FLOT on 1/10 -She understands that chemotherapy is palliative, to prolong her life.  We are unlikely going to cure her cancer. -PD-L1 0-1%, no significant benefit from PD-L1 immunotherapy, FO revealed FGFR2 amplification (+), no other targeted therapy available  -She has been tolerating chemo very well, will continue for now  -PET scan from 05/30/2022 was negative for primary tumor or metastatic disease, the known peritoneal mets did not show on PET.  -Foundation One showed no targetable mutations  -she has been tolerating chemo well overall. Due to fatigue, I have changed her chemo from FLOT to FOLFOX on 06/20/2022, she tolerated well -plan to repeat scan in 3-4 weeks  -she asked the role of surgery, depends on her next restaging CT scan findings, I may refer her to [facility] to discuss HIPEC surgery  -due to her infusion reaction to oxaliplatin on C5, will add additional premeds and give slow infusion over 4 hours with close monitoring   -She has significant fatigue for a few days after treatment, recovers well on second week. -She is not able to return to work due to the cancer and treatment related symptoms.     PLAN: -lab reviewed -Add on pre medication, 4 hours of  Oxaliplatin -proceed with C3 folfox today at same dose  -lab,flush and f/u and FOLFOX 08/01/2022 -will order restaging CT on next visit, to be done in early May    [facility]: Oncology History Overview Note   Cancer Staging  Gastric cancer [facility]) Staging form: Stomach, AJCC 8th Edition - Clinical stage from 04/19/2022: Stage IVB (cT2, cN0, pM1) - Signed by Sheryl Merle, MD on 05/08/2022 Total positive nodes: 0     Gastric cancer ([place])  03/30/2022 Procedure   EGD:  Impression:  - Normal esophagus. - A few gastric polyps. Biopsied. - Gastritis. Biopsied. - Non-bleeding gastric ulcer with no stigmata of bleeding. Biopsied. - Normal examined duodenum. Biopsied.  Findings: Diffuse moderate inflammation characterized by congestion (edema), friability and granularity was found in the cardia, in the gastric fundus and in the gastric body. There were associated erosions in multiple places. Biopsies were taken from the antrum, body, and fundus with a cold forceps for histology. Estimated blood loss was minimal.  One non-bleeding cratered gastric ulcer with no stigmata of bleeding was found on the greater curvature of the stomach. The lesion was 6 mm in largest dimension. The mucosa around the ulcer was heaped and led to some deformity in the antrum. Biopsies were taken with a cold forceps for histology. Estimated blood loss was minimal.    03/30/2022 Pathology Results   [name]  Accession: [id number]  Diagnosis 1. Surgical [P], duodenal - BENIGN SMALL BOWEL MUCOSA WITH NO SIGNIFICANT PATHOLOGIC CHANGES 2. Surgical [P], gastric antrum -  GASTRIC ANTRAL MUCOSA WITH FEATURES OF REACTIVE GASTROPATHY - NEGATIVE FOR H. PYLORI ON H&E STAIN - NEGATIVE FOR INTESTINAL METAPLASIA OR MALIGNANCY 3. Surgical [P], gastric body - GASTRIC OXYNTIC MUCOSA WITH REACTIVE/REPARATIVE CHANGES - NEGATIVE FOR H. PYLORI ON H&E STAIN - NEGATIVE FOR INTESTINAL METAPLASIA,  DYSPLASIA OR MALIGNANCY 4. Surgical [P], greater curve ulceration - ADENOCARCINOMA WITH SIGNET RING CELL FEATURES (SEE NOTE) 5. Surgical [P], gastric polyps - ADENOCARCINOMA WITH SIGNET RING CELL FEATURES (SEE NOTE) 6. Surgical [P], fundus (gastric) - ADENOCARCINOMA WITH SIGNET RING CELL FEATURES (SEE NOTE) 7. Surgical [P], colon, ascending, polyp (1) - TUBULAR ADENOMA. - NO HIGH GRADE DYSPLASIA OR MALIGNANCY. 8. Surgical [P], colon, transverse, polyp (1) - TUBULAR ADENOMA. - NO HIGH GRADE DYSPLASIA OR MALIGNANCY.    04/13/2022 Initial Diagnosis   Gastric cancer ([place])   04/19/2022 Cancer Staging   Staging form: Stomach, AJCC 8th Edition - Clinical stage from 04/19/2022: Stage IVB (cT2, cN0, pM1) - Signed by Sheryl Merle, MD on 05/08/2022 Total positive nodes: 0   05/05/2022 Genetic Testing   Negative genetic testing on the Multi-cancer gene panel + RNA.  FH c.259C>T VUS identified.  The report date is May 05, 2022.  The Multi-Cancer + RNA Panel offered by [facility] includes sequencing and/or deletion/duplication analysis of the following 70 genes:  AIP*, ALK, APC*, ATM*, AXIN2*, BAP1*, BARD1*, BLM*, BMPR1A*, BRCA1*, BRCA2*, BRIP1*, CDC73*, CDH1*, CDK4, CDKN1B*, CDKN2A, CHEK2*, CTNNA1*, DICER1*, EPCAM (del/dup only), EGFR, FH*, FLCN*, GREM1 (promoter dup only), HOXB13, KIT, LZTR1, MAX*, MBD4, MEN1*, MET, MITF, MLH1*, MSH2*, MSH3*, MSH6*, MUTYH*, NF1*, NF2*, NTHL1*, PALB2*, PDGFRA, PMS2*, POLD1*, POLE*, POT1*, PRKAR1A*, PTCH1*, PTEN*, RAD51C*, RAD51D*, RB1*, RET, SDHA* (sequencing only), SDHAF2*, SDHB*, SDHC*, SDHD*, SMAD4*, SMARCA4*, SMARCB1*, SMARCE1*, STK11*, SUFU*, TMEM127*, TP53*, TSC1*, TSC2*, VHL*. RNA analysis is performed for * genes.    05/09/2022 - 06/07/2022 Chemotherapy   Patient is on Treatment Plan : GASTROESOPHAGEAL FLOT q14d X 4 cycles      Miscellaneous   Foundation One  Biomarker Findings Microsatellite status- Cannot be determined Tumor Mutational Burden- Cannot be  determined  Genomic Findings  FGFR2 amplification,FGFR2-TACC2 fusion,  Rearrangement intron 17 ARAF amplification CCND3 amplification TP53 V261fs*74     05/30/2022 Imaging    IMPRESSION: 1. Mild hypermetabolism corresponding to a dominant left upper quadrant mass and smaller perigastric nodules or nodes. 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 Recommend attention to the dominant left upper quadrant soft tissue mass on follow-up exams to exclude unlikely recurrent lymphoma. 2. No gastric hypermetabolism and no typical findings of metastatic disease.   06/20/2022 -  Chemotherapy   Patient is on Treatment Plan : GASTRIC FOLFOX q14d x 12 cycles        INTERVAL HISTORY:  Sheryl [name] is here for a follow up of  Gastric Cancer  She was last seen by me on 07/04/2022. She presents to the clinic accompanied by her father. Pt states that she had a reaction but took Benadryl q 4 -6 hours after treatment. Pt state after treatment she be fatigue after Pump d/c and then her week off she is fine. Pt denied having numbness and tingling in hands and feet.   All other systems were reviewed with the patient and are negative.  MEDICAL HISTORY:  Past Medical History:  Diagnosis Date   Blood transfusion without reported diagnosis    had transfusion with hysterectomy   Cataract    Colon polyps 2012   Diabetes ([place]) 03/13/2021  Diabetes ([place]) 05/21/2019   Family history of breast cancer    Family history of pancreatic cancer    Family history of stomach cancer    Fibroid    GERD (gastroesophageal reflux disease)    H/O blood clots    History of hysterectomy    fibroids and heavy cycles   Hypertension     SURGICAL HISTORY: Past Surgical History:  Procedure Laterality Date   ABDOMINAL HYSTERECTOMY     BIOPSY  04/19/2022   Procedure: BIOPSY;  Surgeon: Irving Copas., MD;  Location: [facility];  Service: Gastroenterology;;    COLONOSCOPY     ESOPHAGOGASTRODUODENOSCOPY (EGD) WITH PROPOFOL N/A 04/19/2022   Procedure: ESOPHAGOGASTRODUODENOSCOPY (EGD) WITH PROPOFOL;  Surgeon: Irving Copas., MD;  Location: [facility];  Service: Gastroenterology;  Laterality: N/A;   EUS N/A 04/19/2022   Procedure: UPPER ENDOSCOPIC ULTRASOUND (EUS) RADIAL;  Surgeon: Irving Copas., MD;  Location: [facility];  Service: Gastroenterology;  Laterality: N/A;   EXCISION OF SKIN TAG  05/03/2022   Procedure: EXCISION OF CHEST WALL SKIN LESION;  Surgeon: Dwan Bolt, MD;  Location: [facility];  Service: General;;   LAPAROSCOPY N/A 05/03/2022   Procedure: LAPAROSCOPY DIAGNOSTIC WITH PERITONEAL WASHINGS;  Surgeon: Dwan Bolt, MD;  Location: [facility];  Service: General;  Laterality: N/A;   POLYPECTOMY  04/19/2022   Procedure: POLYPECTOMY;  Surgeon: Irving Copas., MD;  Location: [facility];  Service: Gastroenterology;;   PORTACATH PLACEMENT N/A 05/03/2022   Procedure: INSERTION PORT-A-CATH WITH ULTRASOUND GUIDANCE;  Surgeon: Dwan Bolt, MD;  Location: [facility];  Service: General;  Laterality: N/A;   UPPER GASTROINTESTINAL ENDOSCOPY      I have reviewed the social history and family history with the patient and they are unchanged from previous note.  ALLERGIES:  is allergic to aspirin, cyclobenzaprine, naproxen sodium, zithromax [azithromycin dihydrate], oxaliplatin, and dilaudid [hydromorphone].  MEDICATIONS:  Current Outpatient Medications  Medication Sig Dispense Refill   acetaminophen (TYLENOL) 500 MG tablet Take 2 tablets (1,000 mg total) by mouth every 8 (eight) hours as needed (pain). 30 tablet 1   amLODipine (NORVASC) 10 MG tablet TAKE 1 TABLET (10 MG TOTAL) BY MOUTH DAILY. 90 tablet 1   Bacillus Coagulans-Inulin (PROBIOTIC-PREBIOTIC) 1-250 BILLION-MG CAPS Take 2 capsules by mouth daily. 30 capsule 1   Biotin 1000 MCG CHEW Chew 1,000 mcg by mouth daily. 30 tablet 2   Blood Glucose Monitoring Suppl (TRUE  METRIX METER) w/Device KIT 1 kit by Does not apply route 3 (three) times daily as needed. 1 kit 0   Cholecalciferol (VITAMIN D3) 125 MCG (5000 UT) TABS Take 1 tablet (5,000 Units total) by mouth daily. 30 tablet 2   diphenhydrAMINE-prednisoLONE-nystatin in lidocaine solution Take 5 mls by mouth 3 (three) times daily as needed for mouth pain. 140 mL 0   ondansetron (ZOFRAN-ODT) 4 MG disintegrating tablet Take 1 tablet (4 mg total) by mouth every 6 (six) hours. Dissolve one tab on tongue 30 minutes before each dose of bowel prep (Patient not taking: Reported on 04/18/2022) 2 tablet 0   pantoprazole (PROTONIX) 40 MG tablet Take 1 tablet (40 mg total) by mouth 2 (two) times daily. 180 tablet 0   prochlorperazine (COMPAZINE) 10 MG tablet Take 1 tablet (10 mg total) by mouth every 6 (six) hours as needed for nausea or vomiting. 30 tablet 1   rosuvastatin (CRESTOR) 5 MG tablet Take 1 tablet (5 mg total) by mouth daily. (Patient not taking: Reported on 04/18/2022) 90 tablet 1  sucralfate (CARAFATE) 1 g tablet Take 1 tablet (1 g total) by mouth 2 (two) times daily. 60 tablet 6   Current Facility-Administered Medications  Medication Dose Route Frequency Provider Last Rate Last Admin   0.9 %  sodium chloride infusion  500 mL Intravenous Continuous [place], MD       Facility-Administered Medications Ordered in Other Visits  Medication Dose Route Frequency Provider Last Rate Last Admin   fluorouracil (ADRUCIL) 5,000 mg in sodium chloride 0.9 % 150 mL chemo infusion  2,400 mg/m2 (Treatment Plan Recorded) Intravenous 1 day or 1 dose Sheryl Merle, MD       leucovorin 788 mg in dextrose 5 % 250 mL infusion  400 mg/m2 (Treatment Plan Recorded) Intravenous Once Sheryl Merle, MD       oxaliplatin (ELOXATIN) 165 mg in dextrose 5 % 500 mL chemo infusion  85 mg/m2 (Treatment Plan Recorded) Intravenous Once Sheryl Merle, MD        PHYSICAL EXAMINATION: ECOG PERFORMANCE STATUS: 1 - Symptomatic but completely  ambulatory  Vitals:   07/18/22 0934  BP: 130/89  Pulse: 98  Resp: 15  Temp: 98.3 F (36.8 C)  SpO2: 100%   Wt Readings from Last 3 Encounters:  07/18/22 187 lb 1.6 oz (84.9 kg)  07/04/22 184 lb 8 oz (83.7 kg)  06/20/22 178 lb 11.2 oz (81.1 kg)     GENERAL:alert, no distress and comfortable SKIN: skin color normal, no rashes or significant lesions EYES: normal, Conjunctiva are pink and non-injected, sclera clear  NEURO: alert & oriented x 3 with fluent speech   LABORATORY DATA:  I have reviewed the data as listed    Latest Ref Rng & Units 07/18/2022    8:29 AM 07/04/2022    8:56 AM 06/20/2022   10:27 AM  CBC  WBC 4.0 - 10.5 K/uL 3.4  4.7  13.7   Hemoglobin 12.0 - 15.0 g/dL 12.8  12.0  12.5   Hematocrit 36.0 - 46.0 % 37.7  34.6  35.9   Platelets 150 - 400 K/uL 237  275  343         Latest Ref Rng & Units 07/18/2022    8:29 AM 07/04/2022    8:56 AM 06/20/2022   10:27 AM  CMP  Glucose 70 - 99 mg/dL 147  103  92   BUN 6 - 20 mg/dL 12  12  14    Creatinine 0.44 - 1.00 mg/dL 0.59  0.61  0.62   Sodium 135 - 145 mmol/L 139  138  139   Potassium 3.5 - 5.1 mmol/L 3.5  3.8  3.9   Chloride 98 - 111 mmol/L 105  103  102   CO2 22 - 32 mmol/L 26  29  31    Calcium 8.9 - 10.3 mg/dL 9.4  9.4  9.2   Total Protein 6.5 - 8.1 g/dL 7.1  6.9  7.0   Total Bilirubin 0.3 - 1.2 mg/dL 0.8  0.7  0.4   Alkaline Phos 38 - 126 U/L 107  91  130   AST 15 - 41 U/L 23  22  17    ALT 0 - 44 U/L 16  16  12        RADIOGRAPHIC STUDIES: I have personally reviewed the radiological images as listed and agreed with the findings in the report. No results found.    Orders Placed This Encounter  Procedures   Ferritin    Standing Status:   Standing    Number of  Occurrences:   20    Standing Expiration Date:   07/18/2023   All questions were answered. The patient knows to call the clinic with any problems, questions or concerns. No barriers to learning was detected. The total time spent in the  appointment was 30 minutes.     Sheryl Merle, MD 07/18/2022   Felicity Coyer, CMA, am acting as scribe for Sheryl Merle, MD.   I have reviewed the above documentation for accuracy and completeness, and I agree with the above.

## 2022-07-20 ENCOUNTER — Inpatient Hospital Stay: Payer: Commercial Managed Care - HMO

## 2022-07-20 VITALS — BP 127/90 | HR 71 | Temp 99.0°F | Resp 16

## 2022-07-20 DIAGNOSIS — C162 Malignant neoplasm of body of stomach: Secondary | ICD-10-CM

## 2022-07-20 DIAGNOSIS — Z5111 Encounter for antineoplastic chemotherapy: Secondary | ICD-10-CM | POA: Diagnosis not present

## 2022-07-20 MED ORDER — HEPARIN SOD (PORK) LOCK FLUSH 100 UNIT/ML IV SOLN
500.0000 [IU] | Freq: Once | INTRAVENOUS | Status: AC | PRN
  Administered 2022-07-20: 500 [IU]

## 2022-07-20 MED ORDER — SODIUM CHLORIDE 0.9% FLUSH
10.0000 mL | INTRAVENOUS | Status: DC | PRN
  Administered 2022-07-20: 10 mL

## 2022-07-22 ENCOUNTER — Other Ambulatory Visit (INDEPENDENT_AMBULATORY_CARE_PROVIDER_SITE_OTHER): Payer: Self-pay | Admitting: Primary Care

## 2022-07-22 ENCOUNTER — Other Ambulatory Visit: Payer: Self-pay | Admitting: Nurse Practitioner

## 2022-07-22 DIAGNOSIS — I10 Essential (primary) hypertension: Secondary | ICD-10-CM

## 2022-07-22 DIAGNOSIS — C162 Malignant neoplasm of body of stomach: Secondary | ICD-10-CM

## 2022-07-22 DIAGNOSIS — Z760 Encounter for issue of repeat prescription: Secondary | ICD-10-CM

## 2022-07-23 ENCOUNTER — Other Ambulatory Visit: Payer: Self-pay

## 2022-07-23 MED ORDER — PROCHLORPERAZINE MALEATE 10 MG PO TABS
10.0000 mg | ORAL_TABLET | Freq: Four times a day (QID) | ORAL | 1 refills | Status: DC | PRN
  Filled 2022-07-23: qty 30, 8d supply, fill #0
  Filled 2022-08-12: qty 30, 8d supply, fill #1

## 2022-07-23 NOTE — Telephone Encounter (Signed)
Will forward to provider  

## 2022-07-24 ENCOUNTER — Other Ambulatory Visit: Payer: Self-pay

## 2022-07-24 MED ORDER — AMLODIPINE BESYLATE 10 MG PO TABS
10.0000 mg | ORAL_TABLET | Freq: Every day | ORAL | 1 refills | Status: DC
  Filled 2022-07-24: qty 30, 30d supply, fill #0
  Filled 2022-08-12 – 2022-08-27 (×3): qty 30, 30d supply, fill #1
  Filled 2022-09-27: qty 30, 30d supply, fill #2
  Filled 2022-10-28: qty 30, 30d supply, fill #3
  Filled 2022-11-27: qty 30, 30d supply, fill #4
  Filled 2022-12-27: qty 30, 30d supply, fill #5

## 2022-07-30 NOTE — Progress Notes (Unsigned)
[place]   [phone] [phone]   Clinic Follow up Note   Patient Care Team: Sheryl Lima, MD as PCP - General (Internal Medicine) Sheryl Merle, MD as Consulting Physician (Oncology)  Date of Service:  08/01/2022  CHIEF COMPLAINT: f/u of  Gastric Cancer     CURRENT THERAPY:   FOLFOX q14d X 12 cycles     ASSESSMENT:   Sheryl [name] is a 57 y.o. 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   -she has started first line chemo FLOT on 1/10 -She understands that chemotherapy is palliative, to prolong her life.  We are unlikely going to cure her cancer. -PD-L1 0-1%, no significant benefit from PD-L1 immunotherapy, FO revealed FGFR2 amplification (+), no other targeted therapy available  -She has been tolerating chemo very well, will continue for now  -PET scan from 05/30/2022 was negative for primary tumor or metastatic disease, the known peritoneal mets did not show on PET.  -Foundation One showed no targetable mutations  -she has been tolerating chemo well overall. Due to fatigue, I have changed her chemo from FLOT to FOLFOX on 06/20/2022, she tolerated well -plan to repeat scan in 3-4 weeks  -she asked the role of surgery, depends on her next restaging CT scan findings, I may refer her to [facility] to discuss HIPEC surgery  -due to her infusion reaction to oxaliplatin on C5, we added additional premeds and gave slow infusion over 4 hours for cycle 6 and she tolerated well  -She is not able to return to work due to the cancer and treatment related symptoms.  -She is tolerating FOLFOX well overall, with moderate fatigue for a few days after infusion but able to recover well.  No signs of neuropathy at this point.   Lab reviewed, slightly worsening leukopenia, will add on Neulasta on day 3. -She has a cruise trip in mid June, will hold off chemo during her trip.     PLAN: -reduce the benadryl to 25 mg due to nausea and drowsiness - Repeat CT every 3 months, will order on next visit. -Lab reviewed- WBC low, will add Neulasta on day 3 -proceed with C4 FOLFOX today at same dose -lab,flush and f/u and FOLOX 08/15/2022  SUMMARY OF ONCOLOGIC HISTORY: Oncology History Overview Note   Cancer Staging  Gastric cancer [facility]) Staging form: Stomach, AJCC 8th Edition - Clinical stage from 04/19/2022: Stage IVB (cT2, cN0, pM1) - Signed by Sheryl Merle, MD on 05/08/2022 Total positive nodes: 0     Gastric cancer  03/30/2022 Procedure   EGD:  Impression:  - Normal esophagus. - A few gastric polyps. Biopsied. - Gastritis. Biopsied. - Non-bleeding gastric ulcer with no stigmata of bleeding. Biopsied. - Normal examined duodenum. Biopsied.  Findings: Diffuse moderate inflammation characterized by congestion (edema), friability and granularity was found in the cardia, in the gastric fundus and in the gastric body. There were associated erosions in multiple places. Biopsies were taken from the antrum, body, and fundus with a cold forceps for histology. Estimated blood loss was minimal.  One non-bleeding cratered gastric ulcer with no stigmata of bleeding was found on the greater curvature of the stomach. The lesion was 6 mm in largest dimension. The mucosa around the ulcer was heaped and led to some deformity  in the antrum. Biopsies were taken with a cold forceps for histology. Estimated blood loss was minimal.    03/30/2022 Pathology Results   [name]  Accession: [id number]  Diagnosis 1. Surgical [P], duodenal - BENIGN SMALL BOWEL MUCOSA WITH NO SIGNIFICANT PATHOLOGIC CHANGES 2. Surgical [P], gastric antrum - GASTRIC ANTRAL MUCOSA WITH FEATURES OF REACTIVE GASTROPATHY - NEGATIVE FOR H.  PYLORI ON H&E STAIN - NEGATIVE FOR INTESTINAL METAPLASIA OR MALIGNANCY 3. Surgical [P], gastric body - GASTRIC OXYNTIC MUCOSA WITH REACTIVE/REPARATIVE CHANGES - NEGATIVE FOR H. PYLORI ON H&E STAIN - NEGATIVE FOR INTESTINAL METAPLASIA, DYSPLASIA OR MALIGNANCY 4. Surgical [P], greater curve ulceration - ADENOCARCINOMA WITH SIGNET RING CELL FEATURES (SEE NOTE) 5. Surgical [P], gastric polyps - ADENOCARCINOMA WITH SIGNET RING CELL FEATURES (SEE NOTE) 6. Surgical [P], fundus (gastric) - ADENOCARCINOMA WITH SIGNET RING CELL FEATURES (SEE NOTE) 7. Surgical [P], colon, ascending, polyp (1) - TUBULAR ADENOMA. - NO HIGH GRADE DYSPLASIA OR MALIGNANCY. 8. Surgical [P], colon, transverse, polyp (1) - TUBULAR ADENOMA. - NO HIGH GRADE DYSPLASIA OR MALIGNANCY.    04/13/2022 Initial Diagnosis   Gastric cancer ([place])   04/19/2022 Cancer Staging   Staging form: Stomach, AJCC 8th Edition - Clinical stage from 04/19/2022: Stage IVB (cT2, cN0, pM1) - Signed by Sheryl Merle, MD on 05/08/2022 Total positive nodes: 0   05/05/2022 Genetic Testing   Negative genetic testing on the Multi-cancer gene panel + RNA.  FH c.259C>T VUS identified.  The report date is May 05, 2022.  The Multi-Cancer + RNA Panel offered by [facility] includes sequencing and/or deletion/duplication analysis of the following 70 genes:  AIP*, ALK, APC*, ATM*, AXIN2*, BAP1*, BARD1*, BLM*, BMPR1A*, BRCA1*, BRCA2*, BRIP1*, CDC73*, CDH1*, CDK4, CDKN1B*, CDKN2A, CHEK2*, CTNNA1*, DICER1*, EPCAM (del/dup only), EGFR, FH*, FLCN*, GREM1 (promoter dup only), HOXB13, KIT, LZTR1, MAX*, MBD4, MEN1*, MET, MITF, MLH1*, MSH2*, MSH3*, MSH6*, MUTYH*, NF1*, NF2*, NTHL1*, PALB2*, PDGFRA, PMS2*, POLD1*, POLE*, POT1*, PRKAR1A*, PTCH1*, PTEN*, RAD51C*, RAD51D*, RB1*, RET, SDHA* (sequencing only), SDHAF2*, SDHB*, SDHC*, SDHD*, SMAD4*, SMARCA4*, SMARCB1*, SMARCE1*, STK11*, SUFU*, TMEM127*, TP53*, TSC1*, TSC2*, VHL*. RNA analysis is performed for * genes.    05/09/2022 -  06/07/2022 Chemotherapy   Patient is on Treatment Plan : GASTROESOPHAGEAL FLOT q14d X 4 cycles      Miscellaneous   Foundation One  Biomarker Findings Microsatellite status- Cannot be determined Tumor Mutational Burden- Cannot be determined  Genomic Findings  FGFR2 amplification,FGFR2-TACC2 fusion,  Rearrangement intron 17 ARAF amplification CCND3 amplification TP53 V230fs*74     05/30/2022 Imaging    IMPRESSION: 1. Mild hypermetabolism corresponding to a dominant left upper quadrant mass and smaller perigastric nodules or nodes. 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 Recommend attention to the dominant left upper quadrant soft tissue mass on follow-up exams to exclude unlikely recurrent lymphoma. 2. No gastric hypermetabolism and no typical findings of metastatic disease.   06/20/2022 -  Chemotherapy   Patient is on Treatment Plan : GASTRIC FOLFOX q14d x 12 cycles        INTERVAL HISTORY:  Sheryl [name] is here for a follow up of  Gastric Cancer . She was last seen by me on 07/18/2022 She presents to the clinic accompanied by father.Pt state she is doing well. No complaints, but is very fatigue after treatment. Pt state that she is able to recover. Pt denied having nausea and diarrhea. Pt state she gets nausea and droopy after receiving the full dose of benadryl.   All  other systems were reviewed with the patient and are negative.  MEDICAL HISTORY:  Past Medical History:  Diagnosis Date   Blood transfusion without reported diagnosis    had transfusion with hysterectomy   Cataract    Colon polyps 2012   Diabetes 03/13/2021   Diabetes 05/21/2019   Family history of breast cancer    Family history of pancreatic cancer    Family history of stomach cancer    Fibroid    GERD (gastroesophageal reflux disease)    H/O blood clots    History of hysterectomy    fibroids and heavy cycles   Hypertension     SURGICAL  HISTORY: Past Surgical History:  Procedure Laterality Date   ABDOMINAL HYSTERECTOMY     BIOPSY  04/19/2022   Procedure: BIOPSY;  Surgeon: Irving Copas., MD;  Location: [facility];  Service: Gastroenterology;;   COLONOSCOPY     ESOPHAGOGASTRODUODENOSCOPY (EGD) WITH PROPOFOL N/A 04/19/2022   Procedure: ESOPHAGOGASTRODUODENOSCOPY (EGD) WITH PROPOFOL;  Surgeon: Irving Copas., MD;  Location: [facility];  Service: Gastroenterology;  Laterality: N/A;   EUS N/A 04/19/2022   Procedure: UPPER ENDOSCOPIC ULTRASOUND (EUS) RADIAL;  Surgeon: Irving Copas., MD;  Location: [facility];  Service: Gastroenterology;  Laterality: N/A;   EXCISION OF SKIN TAG  05/03/2022   Procedure: EXCISION OF CHEST WALL SKIN LESION;  Surgeon: Dwan Bolt, MD;  Location: [facility];  Service: General;;   LAPAROSCOPY N/A 05/03/2022   Procedure: LAPAROSCOPY DIAGNOSTIC WITH PERITONEAL WASHINGS;  Surgeon: Dwan Bolt, MD;  Location: [facility];  Service: General;  Laterality: N/A;   POLYPECTOMY  04/19/2022   Procedure: POLYPECTOMY;  Surgeon: Irving Copas., MD;  Location: [facility];  Service: Gastroenterology;;   PORTACATH PLACEMENT N/A 05/03/2022   Procedure: INSERTION PORT-A-CATH WITH ULTRASOUND GUIDANCE;  Surgeon: Dwan Bolt, MD;  Location: [facility];  Service: General;  Laterality: N/A;   UPPER GASTROINTESTINAL ENDOSCOPY      I have reviewed the social history and family history with the patient and they are unchanged from previous note.  ALLERGIES:  is allergic to aspirin, cyclobenzaprine, naproxen sodium, zithromax [azithromycin dihydrate], oxaliplatin, and dilaudid [hydromorphone].  MEDICATIONS:  Current Outpatient Medications  Medication Sig Dispense Refill   acetaminophen (TYLENOL) 500 MG tablet Take 2 tablets (1,000 mg total) by mouth every 8 (eight) hours as needed (pain). 30 tablet 1   amLODipine (NORVASC) 10 MG tablet Take 1 tablet (10 mg total) by mouth daily. 90 tablet 1    Bacillus Coagulans-Inulin (PROBIOTIC-PREBIOTIC) 1-250 BILLION-MG CAPS Take 2 capsules by mouth daily. 30 capsule 1   Biotin 1000 MCG CHEW Chew 1,000 mcg by mouth daily. 30 tablet 2   Blood Glucose Monitoring Suppl (TRUE METRIX METER) w/Device KIT 1 kit by Does not apply route 3 (three) times daily as needed. 1 kit 0   Cholecalciferol (VITAMIN D3) 125 MCG (5000 UT) TABS Take 1 tablet (5,000 Units total) by mouth daily. 30 tablet 2   diphenhydrAMINE-prednisoLONE-nystatin in lidocaine solution Take 5 mls by mouth 3 (three) times daily as needed for mouth pain. 140 mL 0   ondansetron (ZOFRAN-ODT) 4 MG disintegrating tablet Take 1 tablet (4 mg total) by mouth every 6 (six) hours. Dissolve one tab on tongue 30 minutes before each dose of bowel prep (Patient not taking: Reported on 04/18/2022) 2 tablet 0   pantoprazole (PROTONIX) 40 MG tablet Take 1 tablet (40 mg total) by mouth 2 (two) times daily. 180 tablet 0   prochlorperazine (COMPAZINE) 10  MG tablet Take 1 tablet (10 mg total) by mouth every 6 (six) hours as needed for nausea or vomiting. 30 tablet 1   rosuvastatin (CRESTOR) 5 MG tablet Take 1 tablet (5 mg total) by mouth daily. (Patient not taking: Reported on 04/18/2022) 90 tablet 1   sucralfate (CARAFATE) 1 g tablet Take 1 tablet (1 g total) by mouth 2 (two) times daily. 60 tablet 6   Current Facility-Administered Medications  Medication Dose Route Frequency Provider Last Rate Last Admin   0.9 %  sodium chloride infusion  500 mL Intravenous Continuous [place], MD       Facility-Administered Medications Ordered in Other Visits  Medication Dose Route Frequency Provider Last Rate Last Admin   dexamethasone (DECADRON) 10 mg in sodium chloride 0.9 % 50 mL IVPB  10 mg Intravenous Once Sheryl Merle, MD       famotidine (PEPCID) IVPB 20 mg premix  20 mg Intravenous Once Sheryl Merle, MD 200 mL/hr at 08/01/22 0918 20 mg at 08/01/22 0918   fluorouracil (ADRUCIL) 5,000 mg in sodium chloride 0.9 %  150 mL chemo infusion  2,400 mg/m2 (Treatment Plan Recorded) Intravenous 1 day or 1 dose Sheryl Merle, MD       leucovorin 788 mg in dextrose 5 % 250 mL infusion  400 mg/m2 (Treatment Plan Recorded) Intravenous Once Sheryl Merle, MD       oxaliplatin (ELOXATIN) 165 mg in dextrose 5 % 500 mL chemo infusion  85 mg/m2 (Treatment Plan Recorded) Intravenous Once Sheryl Merle, MD        PHYSICAL EXAMINATION: ECOG PERFORMANCE STATUS: 1 - Symptomatic but completely ambulatory  Vitals:   08/01/22 0811  BP: 122/89  Pulse: 75  Resp: 16  Temp: 98.7 F (37.1 C)  SpO2: 100%   Wt Readings from Last 3 Encounters:  08/01/22 190 lb 1.6 oz (86.2 kg)  07/18/22 187 lb 1.6 oz (84.9 kg)  07/04/22 184 lb 8 oz (83.7 kg)     GENERAL:alert, no distress and comfortable SKIN: skin color normal, no rashes or significant lesions EYES: normal, Conjunctiva are pink and non-injected, sclera clear  NEURO: alert & oriented x 3 with fluent speech   LABORATORY DATA:  I have reviewed the data as listed    Latest Ref Rng & Units 08/01/2022    7:48 AM 07/18/2022    8:29 AM 07/04/2022    8:56 AM  CBC  WBC 4.0 - 10.5 K/uL 3.0  3.4  4.7   Hemoglobin 12.0 - 15.0 g/dL 12.5  12.8  12.0   Hematocrit 36.0 - 46.0 % 37.4  37.7  34.6   Platelets 150 - 400 K/uL 232  237  275         Latest Ref Rng & Units 08/01/2022    7:48 AM 07/18/2022    8:29 AM 07/04/2022    8:56 AM  CMP  Glucose 70 - 99 mg/dL 131  147  103   BUN 6 - 20 mg/dL 13  12  12    Creatinine 0.44 - 1.00 mg/dL 0.62  0.59  0.61   Sodium 135 - 145 mmol/L 138  139  138   Potassium 3.5 - 5.1 mmol/L 3.6  3.5  3.8   Chloride 98 - 111 mmol/L 105  105  103   CO2 22 - 32 mmol/L 27  26  29    Calcium 8.9 - 10.3 mg/dL 9.4  9.4  9.4   Total Protein 6.5 - 8.1 g/dL 7.0  7.1  6.9   Total Bilirubin 0.3 - 1.2 mg/dL 0.7  0.8  0.7   Alkaline Phos 38 - 126 U/L 105  107  91   AST 15 - 41 U/L 19  23  22    ALT 0 - 44 U/L 14  16  16        RADIOGRAPHIC STUDIES: I have personally  reviewed the radiological images as listed and agreed with the findings in the report. No results found.    Orders Placed This Encounter  Procedures   CBC with Differential ([place] Only)    Standing Status:   Future    Standing Expiration Date:   09/26/2023   CMP ([place] only)    Standing Status:   Future    Standing Expiration Date:   09/26/2023   All questions were answered. The patient knows to call the clinic with any problems, questions or concerns. No barriers to learning was detected. The total time spent in the appointment was 30 minutes.     Sheryl Merle, MD 08/01/2022   Felicity Coyer, CMA, am acting as scribe for Sheryl Merle, MD.   I have reviewed the above documentation for accuracy and completeness, and I agree with the above.

## 2022-07-31 ENCOUNTER — Other Ambulatory Visit: Payer: Self-pay

## 2022-07-31 ENCOUNTER — Other Ambulatory Visit: Payer: Self-pay | Admitting: Hematology

## 2022-07-31 MED FILL — Dexamethasone Sodium Phosphate Inj 100 MG/10ML: INTRAMUSCULAR | Qty: 1 | Status: AC

## 2022-07-31 NOTE — Assessment & Plan Note (Signed)
-  [id number] with peritoneal metastasis. 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   -she has started first line chemo FLOT on 1/10 -She understands that chemotherapy is palliative, to prolong her life.  We are unlikely going to cure her cancer. -PD-L1 0-1%, no significant benefit from PD-L1 immunotherapy, FO revealed FGFR2 amplification (+), no other targeted therapy available  -She has been tolerating chemo very well, will continue for now  -PET scan from 05/30/2022 was negative for primary tumor or metastatic disease, the known peritoneal mets did not show on PET.  -Foundation One showed no targetable mutations  -she has been tolerating chemo well overall. Due to fatigue, I have changed her chemo from FLOT to FOLFOX on 06/20/2022, she tolerated well -plan to repeat scan in 3-4 weeks  -she asked the role of surgery, depends on her next restaging CT scan findings, I may refer her to [place] to discuss HIPEC surgery  -due to her infusion reaction to oxaliplatin on C5, we added additional premeds and gave slow infusion over 4 hours for cycle 6 and she tolerated well  -She is not able to return to work due to the cancer and treatment related symptoms.

## 2022-07-31 NOTE — Progress Notes (Signed)
Patient Care Team: Janith Lima, MD as PCP - General (Internal Medicine) Truitt Merle, MD as Consulting Physician (Oncology)   CHIEF COMPLAINT: Follow up gastric cancer   Oncology History Overview Note   Cancer Staging  Gastric cancer [facility]) Staging form: Stomach, AJCC 8th Edition - Clinical stage from 04/19/2022: Stage IVB (cT2, cN0, pM1) - Signed by Truitt Merle, MD on 05/08/2022 Total positive nodes: 0     Gastric cancer  03/30/2022 Procedure   EGD:  Impression:  - Normal esophagus. - A few gastric polyps. Biopsied. - Gastritis. Biopsied. - Non-bleeding gastric ulcer with no stigmata of bleeding. Biopsied. - Normal examined duodenum. Biopsied.  Findings: Diffuse moderate inflammation characterized by congestion (edema), friability and granularity was found in the cardia, in the gastric fundus and in the gastric body. There were associated erosions in multiple places. Biopsies were taken from the antrum, body, and fundus with a cold forceps for histology. Estimated blood loss was minimal.  One non-bleeding cratered gastric ulcer with no stigmata of bleeding was found on the greater curvature of the stomach. The lesion was 6 mm in largest dimension. The mucosa around the ulcer was heaped and led to some deformity in the antrum. Biopsies were taken with a cold forceps for histology. Estimated blood loss was minimal.    03/30/2022 Pathology Results   [name], Sheryl [name]  Accession: [id number]  Diagnosis 1. Surgical [P], duodenal - BENIGN SMALL BOWEL MUCOSA WITH NO SIGNIFICANT PATHOLOGIC CHANGES 2. Surgical [P], gastric antrum - GASTRIC ANTRAL MUCOSA WITH FEATURES OF REACTIVE GASTROPATHY - NEGATIVE FOR H. PYLORI ON H&E STAIN - NEGATIVE FOR INTESTINAL METAPLASIA OR MALIGNANCY 3. Surgical [P], gastric body - GASTRIC OXYNTIC MUCOSA WITH REACTIVE/REPARATIVE CHANGES - NEGATIVE FOR H. PYLORI ON H&E STAIN - NEGATIVE FOR INTESTINAL METAPLASIA, DYSPLASIA OR MALIGNANCY 4.  Surgical [P], greater curve ulceration - ADENOCARCINOMA WITH SIGNET RING CELL FEATURES (SEE NOTE) 5. Surgical [P], gastric polyps - ADENOCARCINOMA WITH SIGNET RING CELL FEATURES (SEE NOTE) 6. Surgical [P], fundus (gastric) - ADENOCARCINOMA WITH SIGNET RING CELL FEATURES (SEE NOTE) 7. Surgical [P], colon, ascending, polyp (1) - TUBULAR ADENOMA. - NO HIGH GRADE DYSPLASIA OR MALIGNANCY. 8. Surgical [P], colon, transverse, polyp (1) - TUBULAR ADENOMA. - NO HIGH GRADE DYSPLASIA OR MALIGNANCY.    04/13/2022 Initial Diagnosis   Gastric cancer ([place])   04/19/2022 Cancer Staging   Staging form: Stomach, AJCC 8th Edition - Clinical stage from 04/19/2022: Stage IVB (cT2, cN0, pM1) - Signed by Truitt Merle, MD on 05/08/2022 Total positive nodes: 0   05/05/2022 Genetic Testing   Negative genetic testing on the Multi-cancer gene panel + RNA.  FH c.259C>T VUS identified.  The report date is May 05, 2022.  The Multi-Cancer + RNA Panel offered by [facility] includes sequencing and/or deletion/duplication analysis of the following 70 genes:  AIP*, ALK, APC*, ATM*, AXIN2*, BAP1*, BARD1*, BLM*, BMPR1A*, BRCA1*, BRCA2*, BRIP1*, CDC73*, CDH1*, CDK4, CDKN1B*, CDKN2A, CHEK2*, CTNNA1*, DICER1*, EPCAM (del/dup only), EGFR, FH*, FLCN*, GREM1 (promoter dup only), HOXB13, KIT, LZTR1, MAX*, MBD4, MEN1*, MET, MITF, MLH1*, MSH2*, MSH3*, MSH6*, MUTYH*, NF1*, NF2*, NTHL1*, PALB2*, PDGFRA, PMS2*, POLD1*, POLE*, POT1*, PRKAR1A*, PTCH1*, PTEN*, RAD51C*, RAD51D*, RB1*, RET, SDHA* (sequencing only), SDHAF2*, SDHB*, SDHC*, SDHD*, SMAD4*, SMARCA4*, SMARCB1*, SMARCE1*, STK11*, SUFU*, TMEM127*, TP53*, TSC1*, TSC2*, VHL*. RNA analysis is performed for * genes.    05/09/2022 - 06/07/2022 Chemotherapy   Patient is on Treatment Plan : GASTROESOPHAGEAL FLOT q14d X 4 cycles      [place]  [facility]  Biomarker Findings Microsatellite status- Cannot be determined Tumor Mutational Burden- Cannot be determined  Genomic Findings   FGFR2 amplification,FGFR2-TACC2 fusion,  Rearrangement intron 17 ARAF amplification CCND3 amplification TP53 [id number]*74     05/30/2022 Imaging    IMPRESSION: 1. Mild hypermetabolism corresponding to a dominant left upper quadrant mass and smaller perigastric nodules or nodes. 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 Recommend attention to the dominant left upper quadrant soft tissue mass on follow-up exams to exclude unlikely recurrent lymphoma. 2. No gastric hypermetabolism and no typical findings of metastatic disease.   06/20/2022 -  Chemotherapy   Patient is on Treatment Plan : GASTRIC FOLFOX q14d x 12 cycles        CURRENT THERAPY: FLOT q14 days starting 05/09/22, changed to FOLFOX q14 days starting 06/20/22 (due to fatigue)   INTERVAL HISTORY Sheryl [name] returns for follow up and treatment as scheduled. Last seen by Dr. Burr Medico 07/18/22 and completed cycle 3 FOLFOX.  ROS   Past Medical History:  Diagnosis Date   Blood transfusion without reported diagnosis    had transfusion with hysterectomy   Cataract    Colon polyps 2012   Diabetes ([place]) 03/13/2021   Diabetes ([place]) 05/21/2019   Family history of breast cancer    Family history of pancreatic cancer    Family history of stomach cancer    Fibroid    GERD (gastroesophageal reflux disease)    H/O blood clots    History of hysterectomy    fibroids and heavy cycles   Hypertension      Past Surgical History:  Procedure Laterality Date   ABDOMINAL HYSTERECTOMY     BIOPSY  04/19/2022   Procedure: BIOPSY;  Surgeon: Irving Copas., MD;  Location: [facility];  Service: Gastroenterology;;   COLONOSCOPY     ESOPHAGOGASTRODUODENOSCOPY (EGD) WITH PROPOFOL N/A 04/19/2022   Procedure: ESOPHAGOGASTRODUODENOSCOPY (EGD) WITH PROPOFOL;  Surgeon: Irving Copas., MD;  Location: [facility];  Service: Gastroenterology;  Laterality: N/A;   EUS N/A 04/19/2022   Procedure:  UPPER ENDOSCOPIC ULTRASOUND (EUS) RADIAL;  Surgeon: Irving Copas., MD;  Location: [facility];  Service: Gastroenterology;  Laterality: N/A;   EXCISION OF SKIN TAG  05/03/2022   Procedure: EXCISION OF CHEST WALL SKIN LESION;  Surgeon: Dwan Bolt, MD;  Location: [place];  Service: General;;   LAPAROSCOPY N/A 05/03/2022   Procedure: LAPAROSCOPY DIAGNOSTIC WITH PERITONEAL WASHINGS;  Surgeon: Dwan Bolt, MD;  Location: [place];  Service: General;  Laterality: N/A;   POLYPECTOMY  04/19/2022   Procedure: POLYPECTOMY;  Surgeon: Irving Copas., MD;  Location: [facility];  Service: Gastroenterology;;   PORTACATH PLACEMENT N/A 05/03/2022   Procedure: INSERTION PORT-A-CATH WITH ULTRASOUND GUIDANCE;  Surgeon: Dwan Bolt, MD;  Location: [place];  Service: General;  Laterality: N/A;   UPPER GASTROINTESTINAL ENDOSCOPY       Outpatient Encounter Medications as of 08/01/2022  Medication Sig   acetaminophen (TYLENOL) 500 MG tablet Take 2 tablets (1,000 mg total) by mouth every 8 (eight) hours as needed (pain).   amLODipine (NORVASC) 10 MG tablet Take 1 tablet (10 mg total) by mouth daily.   Bacillus Coagulans-Inulin (PROBIOTIC-PREBIOTIC) 1-250 BILLION-MG CAPS Take 2 capsules by mouth daily.   Biotin 1000 MCG CHEW Chew 1,000 mcg by mouth daily.   Blood Glucose Monitoring Suppl (TRUE METRIX METER) w/Device KIT 1 kit by Does not apply route 3 (three) times daily as needed.   Cholecalciferol (VITAMIN  D3) 125 MCG (5000 UT) TABS Take 1 tablet (5,000 Units total) by mouth daily.   diphenhydrAMINE-prednisoLONE-nystatin in lidocaine solution Take 5 mls by mouth 3 (three) times daily as needed for mouth pain.   ondansetron (ZOFRAN-ODT) 4 MG disintegrating tablet Take 1 tablet (4 mg total) by mouth every 6 (six) hours. Dissolve one tab on tongue 30 minutes before each dose of bowel prep (Patient not taking: Reported on 04/18/2022)   pantoprazole (PROTONIX) 40 MG tablet Take 1 tablet (40 mg total) by  mouth 2 (two) times daily.   prochlorperazine (COMPAZINE) 10 MG tablet Take 1 tablet (10 mg total) by mouth every 6 (six) hours as needed for nausea or vomiting.   rosuvastatin (CRESTOR) 5 MG tablet Take 1 tablet (5 mg total) by mouth daily. (Patient not taking: Reported on 04/18/2022)   sucralfate (CARAFATE) 1 g tablet Take 1 tablet (1 g total) by mouth 2 (two) times daily.   Facility-Administered Encounter Medications as of 08/01/2022  Medication   0.9 %  sodium chloride infusion     There were no vitals filed for this visit. There is no height or weight on file to calculate BMI.   PHYSICAL EXAM GENERAL:alert, no distress and comfortable SKIN: no rash  EYES: sclera clear NECK: without mass LYMPH:  no palpable cervical or supraclavicular lymphadenopathy  LUNGS: clear with normal breathing effort HEART: regular rate & rhythm, no lower extremity edema ABDOMEN: abdomen soft, non-tender and normal bowel sounds NEURO: alert & oriented x 3 with fluent speech, no focal motor/sensory deficits Breast exam:  PAC without erythema    CBC    Component Value Date/Time   WBC 3.4 (L) 07/18/2022 0829   WBC 5.2 12/05/2021 1837   RBC 4.33 07/18/2022 0829   HGB 12.8 07/18/2022 0829   HGB 14.4 12/20/2020 0957   HGB 13.4 03/01/2011 1410   HCT 37.7 07/18/2022 0829   HCT 40.7 12/20/2020 0957   HCT 39.5 03/01/2011 1410   PLT 237 07/18/2022 0829   PLT 313 12/20/2020 0957   MCV 87.1 07/18/2022 0829   MCV 85 12/20/2020 0957   MCV 82.1 03/01/2011 1410   MCH 29.6 07/18/2022 0829   MCHC 34.0 07/18/2022 0829   RDW 16.5 (H) 07/18/2022 0829   RDW 13.1 12/20/2020 0957   RDW 22.3 (H) 03/01/2011 1410   LYMPHSABS 0.7 07/18/2022 0829   LYMPHSABS 1.2 12/20/2020 0957   LYMPHSABS 1.2 03/01/2011 1410   MONOABS 0.5 07/18/2022 0829   MONOABS 0.3 03/01/2011 1410   EOSABS 0.1 07/18/2022 0829   EOSABS 0.1 12/20/2020 0957   BASOSABS 0.0 07/18/2022 0829   BASOSABS 0.0 12/20/2020 0957   BASOSABS 0.0 03/01/2011  1410     CMP     Component Value Date/Time   NA 139 07/18/2022 0829   NA 137 12/20/2020 0957   K 3.5 07/18/2022 0829   CL 105 07/18/2022 0829   CO2 26 07/18/2022 0829   GLUCOSE 147 (H) 07/18/2022 0829   BUN 12 07/18/2022 0829   BUN 11 12/20/2020 0957   CREATININE 0.59 07/18/2022 0829   CALCIUM 9.4 07/18/2022 0829   PROT 7.1 07/18/2022 0829   PROT 7.3 12/20/2020 0957   ALBUMIN 4.3 07/18/2022 0829   ALBUMIN 4.7 12/20/2020 0957   AST 23 07/18/2022 0829   ALT 16 07/18/2022 0829   ALKPHOS 107 07/18/2022 0829   BILITOT 0.8 07/18/2022 0829   GFRNONAA >60 07/18/2022 0829   GFRAA 106 03/31/2020 1036     ASSESSMENT & PLAN:  PLAN:  No orders of the defined types were placed in this encounter.     All questions were answered. The patient knows to call the clinic with any problems, questions or concerns. No barriers to learning were detected. I spent *** counseling the patient face to face. The total time spent in the appointment was *** and more than 50% was on counseling, review of test results, and coordination of care.   Cira Rue, NP-C @DATE @

## 2022-08-01 ENCOUNTER — Inpatient Hospital Stay: Payer: Commercial Managed Care - HMO | Attending: Physician Assistant

## 2022-08-01 ENCOUNTER — Inpatient Hospital Stay (HOSPITAL_BASED_OUTPATIENT_CLINIC_OR_DEPARTMENT_OTHER): Payer: Commercial Managed Care - HMO | Admitting: Hematology

## 2022-08-01 ENCOUNTER — Encounter: Payer: Self-pay | Admitting: Hematology

## 2022-08-01 ENCOUNTER — Inpatient Hospital Stay: Payer: Commercial Managed Care - HMO

## 2022-08-01 VITALS — BP 130/90 | HR 70 | Resp 17

## 2022-08-01 VITALS — BP 122/89 | HR 75 | Temp 98.7°F | Resp 16 | Wt 190.1 lb

## 2022-08-01 DIAGNOSIS — C786 Secondary malignant neoplasm of retroperitoneum and peritoneum: Secondary | ICD-10-CM | POA: Insufficient documentation

## 2022-08-01 DIAGNOSIS — C169 Malignant neoplasm of stomach, unspecified: Secondary | ICD-10-CM | POA: Diagnosis not present

## 2022-08-01 DIAGNOSIS — C162 Malignant neoplasm of body of stomach: Secondary | ICD-10-CM

## 2022-08-01 DIAGNOSIS — Z5189 Encounter for other specified aftercare: Secondary | ICD-10-CM | POA: Insufficient documentation

## 2022-08-01 DIAGNOSIS — Z95828 Presence of other vascular implants and grafts: Secondary | ICD-10-CM

## 2022-08-01 DIAGNOSIS — Z5111 Encounter for antineoplastic chemotherapy: Secondary | ICD-10-CM | POA: Insufficient documentation

## 2022-08-01 DIAGNOSIS — D500 Iron deficiency anemia secondary to blood loss (chronic): Secondary | ICD-10-CM

## 2022-08-01 LAB — CMP (CANCER CENTER ONLY)
ALT: 14 U/L (ref 0–44)
AST: 19 U/L (ref 15–41)
Albumin: 4.1 g/dL (ref 3.5–5.0)
Alkaline Phosphatase: 105 U/L (ref 38–126)
Anion gap: 6 (ref 5–15)
BUN: 13 mg/dL (ref 6–20)
CO2: 27 mmol/L (ref 22–32)
Calcium: 9.4 mg/dL (ref 8.9–10.3)
Chloride: 105 mmol/L (ref 98–111)
Creatinine: 0.62 mg/dL (ref 0.44–1.00)
GFR, Estimated: >60 mL/min (ref >60)
Glucose, Bld: 131 mg/dL — ABNORMAL HIGH (ref 70–99)
Potassium: 3.6 mmol/L (ref 3.5–5.1)
Sodium: 138 mmol/L (ref 135–145)
Total Bilirubin: 0.7 mg/dL (ref 0.3–1.2)
Total Protein: 7 g/dL (ref 6.5–8.1)

## 2022-08-01 LAB — CBC WITH DIFFERENTIAL (CANCER CENTER ONLY)
Abs Immature Granulocytes: 0.01 10*3/uL (ref 0.00–0.07)
Basophils Absolute: 0 10*3/uL (ref 0.0–0.1)
Basophils Relative: 1 %
Eosinophils Absolute: 0.1 10*3/uL (ref 0.0–0.5)
Eosinophils Relative: 3 %
HCT: 37.4 % (ref 36.0–46.0)
Hemoglobin: 12.5 g/dL (ref 12.0–15.0)
Immature Granulocytes: 0 %
Lymphocytes Relative: 22 %
Lymphs Abs: 0.7 10*3/uL (ref 0.7–4.0)
MCH: 29.7 pg (ref 26.0–34.0)
MCHC: 33.4 g/dL (ref 30.0–36.0)
MCV: 88.8 fL (ref 80.0–100.0)
Monocytes Absolute: 0.5 10*3/uL (ref 0.1–1.0)
Monocytes Relative: 16 %
Neutro Abs: 1.7 10*3/uL (ref 1.7–7.7)
Neutrophils Relative %: 58 %
Platelet Count: 232 10*3/uL (ref 150–400)
RBC: 4.21 MIL/uL (ref 3.87–5.11)
RDW: 15.9 % — ABNORMAL HIGH (ref 11.5–15.5)
WBC Count: 3 10*3/uL — ABNORMAL LOW (ref 4.0–10.5)
nRBC: 0 % (ref 0.0–0.2)

## 2022-08-01 LAB — FERRITIN: Ferritin: 84 ng/mL (ref 11–307)

## 2022-08-01 MED ORDER — FAMOTIDINE IN NACL 20-0.9 MG/50ML-% IV SOLN
20.0000 mg | Freq: Once | INTRAVENOUS | Status: AC
  Administered 2022-08-01: 20 mg via INTRAVENOUS
  Filled 2022-08-01: qty 50

## 2022-08-01 MED ORDER — OXALIPLATIN CHEMO INJECTION 100 MG/20ML
85.0000 mg/m2 | Freq: Once | INTRAVENOUS | Status: AC
  Administered 2022-08-01: 165 mg via INTRAVENOUS
  Filled 2022-08-01: qty 33

## 2022-08-01 MED ORDER — PALONOSETRON HCL INJECTION 0.25 MG/5ML
0.2500 mg | Freq: Once | INTRAVENOUS | Status: AC
  Administered 2022-08-01: 0.25 mg via INTRAVENOUS
  Filled 2022-08-01: qty 5

## 2022-08-01 MED ORDER — SODIUM CHLORIDE 0.9 % IV SOLN
10.0000 mg | Freq: Once | INTRAVENOUS | Status: AC
  Administered 2022-08-01: 10 mg via INTRAVENOUS
  Filled 2022-08-01: qty 10

## 2022-08-01 MED ORDER — DIPHENHYDRAMINE HCL 50 MG/ML IJ SOLN
25.0000 mg | Freq: Once | INTRAMUSCULAR | Status: AC
  Administered 2022-08-01: 25 mg via INTRAVENOUS
  Filled 2022-08-01: qty 1

## 2022-08-01 MED ORDER — DEXTROSE 5 % IV SOLN: Freq: Once | INTRAVENOUS | Status: AC

## 2022-08-01 MED ORDER — SODIUM CHLORIDE 0.9 % IV SOLN
2400.0000 mg/m2 | INTRAVENOUS | Status: DC
  Administered 2022-08-01: 5000 mg via INTRAVENOUS
  Filled 2022-08-01: qty 100

## 2022-08-01 MED ORDER — SODIUM CHLORIDE 0.9% FLUSH
10.0000 mL | Freq: Once | INTRAVENOUS | Status: AC
  Administered 2022-08-01: 10 mL

## 2022-08-01 MED ORDER — LEUCOVORIN CALCIUM INJECTION 350 MG
400.0000 mg/m2 | Freq: Once | INTRAVENOUS | Status: AC
  Administered 2022-08-01: 788 mg via INTRAVENOUS
  Filled 2022-08-01: qty 25

## 2022-08-01 NOTE — Patient Instructions (Signed)
[facility]  Discharge Instructions: Thank you for choosing [facility] to provide your oncology and hematology care.   If you have a lab appointment with the Cancer Center, please go directly to the Cancer Center and check in at the registration area.   Wear comfortable clothing and clothing appropriate for easy access to any Portacath or PICC line.   We strive to give you quality time with your provider. You may need to reschedule your appointment if you arrive late (15 or more minutes).  Arriving late affects you and other patients whose appointments are after yours.  Also, if you miss three or more appointments without notifying the office, you may be dismissed from the clinic at the provider's discretion.      For prescription refill requests, have your pharmacy contact our office and allow 72 hours for refills to be completed.    Today you received the following chemotherapy and/or immunotherapy agents: Oxaliplatin, Leucovorin, 5FU      To help prevent nausea and vomiting after your treatment, we encourage you to take your nausea medication as directed.  BELOW ARE SYMPTOMS THAT SHOULD BE REPORTED IMMEDIATELY: *FEVER GREATER THAN 100.4 F (38 C) OR HIGHER *CHILLS OR SWEATING *NAUSEA AND VOMITING THAT IS NOT CONTROLLED WITH YOUR NAUSEA MEDICATION *UNUSUAL SHORTNESS OF BREATH *UNUSUAL BRUISING OR BLEEDING *URINARY PROBLEMS (pain or burning when urinating, or frequent urination) *BOWEL PROBLEMS (unusual diarrhea, constipation, pain near the anus) TENDERNESS IN MOUTH AND THROAT WITH OR WITHOUT PRESENCE OF ULCERS (sore throat, sores in mouth, or a toothache) UNUSUAL RASH, SWELLING OR PAIN  UNUSUAL VAGINAL DISCHARGE OR ITCHING   Items with * indicate a potential emergency and should be followed up as soon as possible or go to the Emergency Department if any problems should occur.  Please show the CHEMOTHERAPY ALERT CARD or IMMUNOTHERAPY  ALERT CARD at check-in to the Emergency Department and triage nurse.  Should you have questions after your visit or need to cancel or reschedule your appointment, please contact [facility]  Dept: [phone]  and follow the prompts.  Office hours are 8:00 a.m. to 4:30 p.m. Monday - Friday. Please note that voicemails left after 4:00 p.m. may not be returned until the following business day.  We are closed weekends and major holidays. You have access to a nurse at all times for urgent questions. Please call the main number to the clinic Dept: [phone] and follow the prompts.   For any non-urgent questions, you may also contact your provider using MyChart. We now offer e-Visits for anyone 18 and older to request care online for non-urgent symptoms. For details visit [email or web address].   Also download the MyChart app! Go to the app store, search "MyChart", open the app, select [place], and log in with your MyChart username and password.   

## 2022-08-03 ENCOUNTER — Inpatient Hospital Stay: Payer: Commercial Managed Care - HMO

## 2022-08-03 ENCOUNTER — Other Ambulatory Visit: Payer: Self-pay

## 2022-08-03 VITALS — BP 132/86 | HR 79 | Temp 99.0°F | Resp 18

## 2022-08-03 DIAGNOSIS — C162 Malignant neoplasm of body of stomach: Secondary | ICD-10-CM

## 2022-08-03 DIAGNOSIS — Z5111 Encounter for antineoplastic chemotherapy: Secondary | ICD-10-CM | POA: Diagnosis not present

## 2022-08-03 MED ORDER — SODIUM CHLORIDE 0.9% FLUSH
10.0000 mL | INTRAVENOUS | Status: DC | PRN
  Administered 2022-08-03: 10 mL

## 2022-08-03 MED ORDER — HEPARIN SOD (PORK) LOCK FLUSH 100 UNIT/ML IV SOLN
500.0000 [IU] | Freq: Once | INTRAVENOUS | Status: AC | PRN
  Administered 2022-08-03: 500 [IU]

## 2022-08-08 ENCOUNTER — Encounter: Payer: Self-pay | Admitting: Hematology

## 2022-08-10 DIAGNOSIS — Z5189 Encounter for other specified aftercare: Secondary | ICD-10-CM | POA: Diagnosis not present

## 2022-08-10 DIAGNOSIS — C169 Malignant neoplasm of stomach, unspecified: Secondary | ICD-10-CM | POA: Diagnosis not present

## 2022-08-10 DIAGNOSIS — Z5111 Encounter for antineoplastic chemotherapy: Secondary | ICD-10-CM | POA: Diagnosis present

## 2022-08-10 DIAGNOSIS — C786 Secondary malignant neoplasm of retroperitoneum and peritoneum: Secondary | ICD-10-CM | POA: Diagnosis not present

## 2022-08-12 ENCOUNTER — Other Ambulatory Visit: Payer: Self-pay | Admitting: Internal Medicine

## 2022-08-13 ENCOUNTER — Other Ambulatory Visit: Payer: Self-pay

## 2022-08-13 ENCOUNTER — Encounter: Payer: Self-pay | Admitting: Hematology

## 2022-08-14 ENCOUNTER — Other Ambulatory Visit: Payer: Self-pay

## 2022-08-14 MED FILL — Dexamethasone Sodium Phosphate Inj 100 MG/10ML: INTRAMUSCULAR | Qty: 1 | Status: AC

## 2022-08-14 NOTE — Progress Notes (Unsigned)
[facility]   Telephone:([phone] Fax:([phone]   Clinic Follow up Note   Patient Care Team: Sheryl Grandchild, MD as PCP - General (Internal Medicine) Sheryl Mood, MD as Consulting Physician (Oncology)  Date of Service:  08/15/2022  CHIEF COMPLAINT: f/u of Gastric Cancer   CURRENT THERAPY: FOLFOX q14d X 12 cycles     ASSESSMENT: Sheryl [name] is a 57 y.o. 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   -she has started first line chemo FLOT on 1/10 -She understands that chemotherapy is palliative, to prolong her life.  We are unlikely going to cure her cancer. -PD-L1 0-1%, no significant benefit from PD-L1 immunotherapy, FO revealed FGFR2 amplification (+), no other targeted therapy available  -She has been tolerating chemo very well, will continue for now  -PET scan from 05/30/2022 was negative for primary tumor or metastatic disease, the known peritoneal mets did not show on PET.  -Foundation One showed no targetable mutations  -she has been tolerating chemo well overall. Due to fatigue, I have changed her chemo from FLOT to FOLFOX on 06/20/2022, she tolerated well -she previously asked the role of surgery, depends on her next restaging CT scan findings, I may refer her to [facility] or [facility] to discuss HIPEC surgery  -due to her infusion reaction to oxaliplatin on C5, we added additional premeds and gave slow infusion over 4 hours for cycle 6 and she tolerated well  -She is not able to return to work due to the cancer and treatment related symptoms.  -She is tolerating FOLFOX well overall, with moderate fatigue for a few days after infusion but able to recover well.  No signs of neuropathy at this point. -will repeat restaging CT in next  few weeks      PLAN: -lab  -I recommend Miralax for constipation - I ordered CT scan to be done in 2-4 weeks -lab/flush and f/u and FOLFOX 08/29/2022    SUMMARY OF ONCOLOGIC HISTORY: Oncology History Overview Note   Cancer Staging  Gastric cancer [facility]) Staging form: Stomach, AJCC 8th Edition - Clinical stage from 04/19/2022: Stage IVB (cT2, cN0, pM1) - Signed by Sheryl Mood, MD on 05/08/2022 Total positive nodes: 0     Gastric cancer  03/30/2022 Procedure   EGD:  Impression:  - Normal esophagus. - A few gastric polyps. Biopsied. - Gastritis. Biopsied. - Non-bleeding gastric ulcer with no stigmata of bleeding. Biopsied. - Normal examined duodenum. Biopsied.  Findings: Diffuse moderate inflammation characterized by congestion (edema), friability and granularity was found in the cardia, in the gastric fundus and in the gastric body. There were associated erosions in multiple places. Biopsies were taken from the antrum, body, and fundus with a cold forceps for histology. Estimated blood loss was minimal.  One non-bleeding cratered gastric ulcer with no stigmata of bleeding was found on the greater curvature of the stomach. The lesion was 6 mm in largest dimension. The mucosa around the ulcer was heaped and led to some deformity in the antrum. Biopsies were taken with a cold forceps for histology. Estimated blood loss was minimal.    03/30/2022 Pathology Results   Patient: [name], [name]  Accession: [id number]  Diagnosis 1. Surgical [P], duodenal - BENIGN SMALL BOWEL MUCOSA WITH NO SIGNIFICANT PATHOLOGIC CHANGES 2. Surgical [P], gastric antrum -  GASTRIC ANTRAL MUCOSA WITH FEATURES OF REACTIVE GASTROPATHY - NEGATIVE FOR H. PYLORI ON H&E STAIN - NEGATIVE FOR INTESTINAL METAPLASIA OR MALIGNANCY 3. Surgical [P], gastric body - GASTRIC OXYNTIC MUCOSA WITH REACTIVE/REPARATIVE CHANGES - NEGATIVE FOR H. PYLORI ON H&E STAIN - NEGATIVE FOR INTESTINAL METAPLASIA, DYSPLASIA OR  MALIGNANCY 4. Surgical [P], greater curve ulceration - ADENOCARCINOMA WITH SIGNET RING CELL FEATURES (SEE NOTE) 5. Surgical [P], gastric polyps - ADENOCARCINOMA WITH SIGNET RING CELL FEATURES (SEE NOTE) 6. Surgical [P], fundus (gastric) - ADENOCARCINOMA WITH SIGNET RING CELL FEATURES (SEE NOTE) 7. Surgical [P], colon, ascending, polyp (1) - TUBULAR ADENOMA. - NO HIGH GRADE DYSPLASIA OR MALIGNANCY. 8. Surgical [P], colon, transverse, polyp (1) - TUBULAR ADENOMA. - NO HIGH GRADE DYSPLASIA OR MALIGNANCY.    04/13/2022 Initial Diagnosis   Gastric cancer (HCC)   04/19/2022 Cancer Staging   Staging form: Stomach, AJCC 8th Edition - Clinical stage from 04/19/2022: Stage IVB (cT2, cN0, pM1) - Signed by Sheryl Mood, MD on 05/08/2022 Total positive nodes: 0   05/05/2022 Genetic Testing   Negative genetic testing on the Multi-cancer gene panel + RNA.  FH c.259C>T VUS identified.  The report date is May 05, 2022.  The Multi-Cancer + RNA Panel offered by [facility] includes sequencing and/or deletion/duplication analysis of the following 70 genes:  AIP*, ALK, APC*, ATM*, AXIN2*, BAP1*, BARD1*, BLM*, BMPR1A*, BRCA1*, BRCA2*, BRIP1*, CDC73*, CDH1*, CDK4, CDKN1B*, CDKN2A, CHEK2*, CTNNA1*, DICER1*, EPCAM (del/dup only), EGFR, FH*, FLCN*, GREM1 (promoter dup only), HOXB13, KIT, LZTR1, MAX*, MBD4, MEN1*, MET, MITF, MLH1*, MSH2*, MSH3*, MSH6*, MUTYH*, NF1*, NF2*, NTHL1*, PALB2*, PDGFRA, PMS2*, POLD1*, POLE*, POT1*, PRKAR1A*, PTCH1*, PTEN*, RAD51C*, RAD51D*, RB1*, RET, SDHA* (sequencing only), SDHAF2*, SDHB*, SDHC*, SDHD*, SMAD4*, SMARCA4*, SMARCB1*, SMARCE1*, STK11*, SUFU*, TMEM127*, TP53*, TSC1*, TSC2*, VHL*. RNA analysis is performed for * genes.    05/09/2022 - 06/07/2022 Chemotherapy   Patient is on Treatment Plan : GASTROESOPHAGEAL FLOT q14d X 4 cycles      Miscellaneous   Foundation One  Biomarker Findings Microsatellite status- Cannot be determined Tumor Mutational Burden- Cannot be  determined  Genomic Findings  FGFR2 amplification,FGFR2-TACC2 fusion,  Rearrangement intron 17 ARAF amplification CCND3 amplification TP53 V235fs*74     05/30/2022 Imaging    IMPRESSION: 1. Mild hypermetabolism corresponding to a dominant left upper quadrant mass and smaller perigastric nodules or nodes. 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 Recommend attention to the dominant left upper quadrant soft tissue mass on follow-up exams to exclude unlikely recurrent lymphoma. 2. No gastric hypermetabolism and no typical findings of metastatic disease.   06/20/2022 -  Chemotherapy   Patient is on Treatment Plan : GASTRIC FOLFOX q14d x 12 cycles        INTERVAL HISTORY:  Sheryl [name] is here for a follow up of Gastric Cancer .She was last seen by me on 08/01/2022. She presents to the clinic accompanied by father.Pt state everything is going good. Pt has no concerns. Pt denied having numbness and tingling. Pt state that she is constipated.      All other systems were reviewed with the patient and are negative.  MEDICAL HISTORY:  Past Medical History:  Diagnosis Date   Blood transfusion without reported diagnosis    had transfusion with hysterectomy   Cataract    Colon polyps 2012   Diabetes 03/13/2021   Diabetes 05/21/2019   Family history of breast cancer    Family history of pancreatic cancer    Family history of stomach  cancer    Fibroid    GERD (gastroesophageal reflux disease)    H/O blood clots    History of hysterectomy    fibroids and heavy cycles   Hypertension     SURGICAL HISTORY: Past Surgical History:  Procedure Laterality Date   ABDOMINAL HYSTERECTOMY     BIOPSY  04/19/2022   Procedure: BIOPSY;  Surgeon: Lemar Lofty., MD;  Location: [facility];  Service: Gastroenterology;;   COLONOSCOPY     ESOPHAGOGASTRODUODENOSCOPY (EGD) WITH PROPOFOL N/A 04/19/2022   Procedure: ESOPHAGOGASTRODUODENOSCOPY  (EGD) WITH PROPOFOL;  Surgeon: Lemar Lofty., MD;  Location: [facility];  Service: Gastroenterology;  Laterality: N/A;   EUS N/A 04/19/2022   Procedure: UPPER ENDOSCOPIC ULTRASOUND (EUS) RADIAL;  Surgeon: Lemar Lofty., MD;  Location: [facility];  Service: Gastroenterology;  Laterality: N/A;   EXCISION OF SKIN TAG  05/03/2022   Procedure: EXCISION OF CHEST WALL SKIN LESION;  Surgeon: Fritzi Mandes, MD;  Location: [facility] OR;  Service: General;;   LAPAROSCOPY N/A 05/03/2022   Procedure: LAPAROSCOPY DIAGNOSTIC WITH PERITONEAL WASHINGS;  Surgeon: Fritzi Mandes, MD;  Location: [facility] OR;  Service: General;  Laterality: N/A;   POLYPECTOMY  04/19/2022   Procedure: POLYPECTOMY;  Surgeon: Lemar Lofty., MD;  Location: [facility];  Service: Gastroenterology;;   PORTACATH PLACEMENT N/A 05/03/2022   Procedure: INSERTION PORT-A-CATH WITH ULTRASOUND GUIDANCE;  Surgeon: Fritzi Mandes, MD;  Location: [facility] OR;  Service: General;  Laterality: N/A;   UPPER GASTROINTESTINAL ENDOSCOPY      I have reviewed the social history and family history with the patient and they are unchanged from previous note.  ALLERGIES:  is allergic to aspirin, cyclobenzaprine, naproxen sodium, zithromax [azithromycin dihydrate], oxaliplatin, and dilaudid [hydromorphone].  MEDICATIONS:  Current Outpatient Medications  Medication Sig Dispense Refill   acetaminophen (TYLENOL) 500 MG tablet Take 2 tablets (1,000 mg total) by mouth every 8 (eight) hours as needed (pain). 30 tablet 1   amLODipine (NORVASC) 10 MG tablet Take 1 tablet (10 mg total) by mouth daily. 90 tablet 1   Bacillus Coagulans-Inulin (PROBIOTIC-PREBIOTIC) 1-250 BILLION-MG CAPS Take 2 capsules by mouth daily. 30 capsule 1   Biotin 1000 MCG CHEW Chew 1,000 mcg by mouth daily. 30 tablet 2   Blood Glucose Monitoring Suppl (TRUE METRIX METER) w/Device KIT 1 kit by Does not apply route 3 (three) times daily as needed. 1 kit 0   Cholecalciferol (VITAMIN  D3) 125 MCG (5000 UT) TABS Take 1 tablet (5,000 Units total) by mouth daily. 30 tablet 2   diphenhydrAMINE-prednisoLONE-nystatin in lidocaine solution Take 5 mls by mouth 3 (three) times daily as needed for mouth pain. 140 mL 0   ondansetron (ZOFRAN-ODT) 4 MG disintegrating tablet Take 1 tablet (4 mg total) by mouth every 6 (six) hours. Dissolve one tab on tongue 30 minutes before each dose of bowel prep (Patient not taking: Reported on 04/18/2022) 2 tablet 0   pantoprazole (PROTONIX) 40 MG tablet Take 1 tablet (40 mg total) by mouth 2 (two) times daily. 180 tablet 0   prochlorperazine (COMPAZINE) 10 MG tablet Take 1 tablet (10 mg total) by mouth every 6 (six) hours as needed for nausea or vomiting. 30 tablet 1   rosuvastatin (CRESTOR) 5 MG tablet Take 1 tablet (5 mg total) by mouth daily. (Patient not taking: Reported on 04/18/2022) 90 tablet 1   sucralfate (CARAFATE) 1 g tablet Take 1 tablet (1 g total) by mouth 2 (two) times daily. 60 tablet 6   Current  Facility-Administered Medications  Medication Dose Route Frequency Provider Last Rate Last Admin   0.9 %  sodium chloride infusion  500 mL Intravenous Continuous Tressia Danas, MD       Facility-Administered Medications Ordered in Other Visits  Medication Dose Route Frequency Provider Last Rate Last Admin   dexamethasone (DECADRON) 10 mg in sodium chloride 0.9 % 50 mL IVPB  10 mg Intravenous Once Sheryl Mood, MD 204 mL/hr at 08/15/22 1001 10 mg at 08/15/22 1001   diphenhydrAMINE (BENADRYL) injection 25 mg  25 mg Intravenous Once Sheryl Mood, MD       famotidine (PEPCID) IVPB 20 mg premix  20 mg Intravenous Once Sheryl Mood, MD       fluorouracil (ADRUCIL) 5,000 mg in sodium chloride 0.9 % 150 mL chemo infusion  2,400 mg/m2 (Treatment Plan Recorded) Intravenous 1 day or 1 dose Sheryl Mood, MD       leucovorin 788 mg in dextrose 5 % 250 mL infusion  400 mg/m2 (Treatment Plan Recorded) Intravenous Once Sheryl Mood, MD       oxaliplatin (ELOXATIN) 165 mg  in dextrose 5 % 500 mL chemo infusion  85 mg/m2 (Treatment Plan Recorded) Intravenous Once Sheryl Mood, MD       palonosetron (ALOXI) injection 0.25 mg  0.25 mg Intravenous Once Sheryl Mood, MD       sodium chloride flush (NS) 0.9 % injection 10 mL  10 mL Intracatheter PRN Sheryl Mood, MD        PHYSICAL EXAMINATION: ECOG PERFORMANCE STATUS: 1 - Symptomatic but completely ambulatory  Vitals:   08/15/22 0907  BP: 119/86  Pulse: 67  Resp: 16  Temp: 98.7 F (37.1 C)  SpO2: 100%   Wt Readings from Last 3 Encounters:  08/15/22 190 lb 14.4 oz (86.6 kg)  08/01/22 190 lb 1.6 oz (86.2 kg)  07/18/22 187 lb 1.6 oz (84.9 kg)     GENERAL:alert, no distress and comfortable SKIN: skin color normal, no rashes or significant lesions EYES: normal, Conjunctiva are pink and non-injected, sclera clear  NEURO: alert & oriented x 3 with fluent speech   LABORATORY DATA:  I have reviewed the data as listed    Latest Ref Rng & Units 08/15/2022    8:26 AM 08/01/2022    7:48 AM 07/18/2022    8:29 AM  CBC  WBC 4.0 - 10.5 K/uL 3.3  3.0  3.4   Hemoglobin 12.0 - 15.0 g/dL 13.0  86.5  78.4   Hematocrit 36.0 - 46.0 % 37.5  37.4  37.7   Platelets 150 - 400 K/uL 221  232  237         Latest Ref Rng & Units 08/15/2022    8:26 AM 08/01/2022    7:48 AM 07/18/2022    8:29 AM  CMP  Glucose 70 - 99 mg/dL 696  295  284   BUN 6 - 20 mg/dL 13  13  12    Creatinine 0.44 - 1.00 mg/dL 1.32  4.40  1.02   Sodium 135 - 145 mmol/L 140  138  139   Potassium 3.5 - 5.1 mmol/L 3.6  3.6  3.5   Chloride 98 - 111 mmol/L 105  105  105   CO2 22 - 32 mmol/L 28  27  26    Calcium 8.9 - 10.3 mg/dL 9.4  9.4  9.4   Total Protein 6.5 - 8.1 g/dL 7.2  7.0  7.1   Total Bilirubin 0.3 - 1.2 mg/dL 0.7  0.7  0.8   Alkaline Phos 38 - 126 U/L 109  105  107   AST 15 - 41 U/L 23  19  23    ALT 0 - 44 U/L 15  14  16        RADIOGRAPHIC STUDIES: I have personally reviewed the radiological images as listed and agreed with the findings in the  report. No results found.    Orders Placed This Encounter  Procedures   CT Abdomen Pelvis W Contrast    Standing Status:   Future    Standing Expiration Date:   08/15/2023    Order Specific Question:   If indicated for the ordered procedure, I authorize the administration of contrast media per Radiology protocol    Answer:   Yes    Order Specific Question:   Does the patient have a contrast media/X-ray dye allergy?    Answer:   No    Order Specific Question:   Is patient pregnant?    Answer:   No    Order Specific Question:   Preferred imaging location?    Answer:   [facility]    Order Specific Question:   Release to patient    Answer:   Immediate [1]    Order Specific Question:   If indicated for the ordered procedure, I authorize the administration of oral contrast media per Radiology protocol    Answer:   Yes   All questions were answered. The patient knows to call the clinic with any problems, questions or concerns. No barriers to learning was detected. The total time spent in the appointment was 30 minutes.     Sheryl Mood, MD 08/15/2022   Carolin Coy, CMA, am acting as scribe for Sheryl Mood, MD.   I have reviewed the above documentation for accuracy and completeness, and I agree with the above.

## 2022-08-14 NOTE — Assessment & Plan Note (Addendum)
-  [id number] with peritoneal metastasis. 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   -she has started first line chemo FLOT on 1/10 -She understands that chemotherapy is palliative, to prolong her life.  We are unlikely going to cure her cancer. -PD-L1 0-1%, no significant benefit from PD-L1 immunotherapy, FO revealed FGFR2 amplification (+), no other targeted therapy available  -She has been tolerating chemo very well, will continue for now  -PET scan from 05/30/2022 was negative for primary tumor or metastatic disease, the known peritoneal mets did not show on PET.  -Foundation One showed no targetable mutations  -she has been tolerating chemo well overall. Due to fatigue, I have changed her chemo from FLOT to FOLFOX on 06/20/2022, she tolerated well -she previously asked the role of surgery, depends on her next restaging CT scan findings, I may refer her to [facility] or [facility] to discuss HIPEC surgery  -due to her infusion reaction to oxaliplatin on C5, we added additional premeds and gave slow infusion over 4 hours for cycle 6 and she tolerated well  -She is not able to return to work due to the cancer and treatment related symptoms.  -She is tolerating FOLFOX well overall, with moderate fatigue for a few days after infusion but able to recover well.  No signs of neuropathy at this point. -will repeat restaging CT in next few weeks

## 2022-08-15 ENCOUNTER — Inpatient Hospital Stay: Payer: Commercial Managed Care - HMO

## 2022-08-15 ENCOUNTER — Encounter: Payer: Self-pay | Admitting: Hematology

## 2022-08-15 ENCOUNTER — Inpatient Hospital Stay (HOSPITAL_BASED_OUTPATIENT_CLINIC_OR_DEPARTMENT_OTHER): Payer: Commercial Managed Care - HMO | Admitting: Hematology

## 2022-08-15 VITALS — BP 139/88 | HR 79 | Resp 16

## 2022-08-15 VITALS — BP 119/86 | HR 67 | Temp 98.7°F | Resp 16 | Ht 68.0 in | Wt 190.9 lb

## 2022-08-15 DIAGNOSIS — C162 Malignant neoplasm of body of stomach: Secondary | ICD-10-CM

## 2022-08-15 DIAGNOSIS — Z5111 Encounter for antineoplastic chemotherapy: Secondary | ICD-10-CM | POA: Diagnosis not present

## 2022-08-15 LAB — CBC WITH DIFFERENTIAL (CANCER CENTER ONLY)
Abs Immature Granulocytes: 0 10*3/uL (ref 0.00–0.07)
Basophils Absolute: 0.1 10*3/uL (ref 0.0–0.1)
Basophils Relative: 2 %
Eosinophils Absolute: 0.2 10*3/uL (ref 0.0–0.5)
Eosinophils Relative: 5 %
HCT: 37.5 % (ref 36.0–46.0)
Hemoglobin: 13 g/dL (ref 12.0–15.0)
Immature Granulocytes: 0 %
Lymphocytes Relative: 26 %
Lymphs Abs: 0.9 10*3/uL (ref 0.7–4.0)
MCH: 30 pg (ref 26.0–34.0)
MCHC: 34.7 g/dL (ref 30.0–36.0)
MCV: 86.6 fL (ref 80.0–100.0)
Monocytes Absolute: 0.5 10*3/uL (ref 0.1–1.0)
Monocytes Relative: 15 %
Neutro Abs: 1.8 10*3/uL (ref 1.7–7.7)
Neutrophils Relative %: 52 %
Platelet Count: 221 10*3/uL (ref 150–400)
RBC: 4.33 MIL/uL (ref 3.87–5.11)
RDW: 15.1 % (ref 11.5–15.5)
WBC Count: 3.3 10*3/uL — ABNORMAL LOW (ref 4.0–10.5)
nRBC: 0 % (ref 0.0–0.2)

## 2022-08-15 LAB — CMP (CANCER CENTER ONLY)
ALT: 15 U/L (ref 0–44)
AST: 23 U/L (ref 15–41)
Albumin: 4.2 g/dL (ref 3.5–5.0)
Alkaline Phosphatase: 109 U/L (ref 38–126)
Anion gap: 7 (ref 5–15)
BUN: 13 mg/dL (ref 6–20)
CO2: 28 mmol/L (ref 22–32)
Calcium: 9.4 mg/dL (ref 8.9–10.3)
Chloride: 105 mmol/L (ref 98–111)
Creatinine: 0.64 mg/dL (ref 0.44–1.00)
GFR, Estimated: >60 mL/min (ref >60)
Glucose, Bld: 108 mg/dL — ABNORMAL HIGH (ref 70–99)
Potassium: 3.6 mmol/L (ref 3.5–5.1)
Sodium: 140 mmol/L (ref 135–145)
Total Bilirubin: 0.7 mg/dL (ref 0.3–1.2)
Total Protein: 7.2 g/dL (ref 6.5–8.1)

## 2022-08-15 MED ORDER — SODIUM CHLORIDE 0.9% FLUSH: 10.0000 mL | INTRAVENOUS | Status: DC | PRN

## 2022-08-15 MED ORDER — DEXTROSE 5 % IV SOLN: Freq: Once | INTRAVENOUS | Status: AC

## 2022-08-15 MED ORDER — PALONOSETRON HCL INJECTION 0.25 MG/5ML
0.2500 mg | Freq: Once | INTRAVENOUS | Status: AC
  Administered 2022-08-15: 0.25 mg via INTRAVENOUS
  Filled 2022-08-15: qty 5

## 2022-08-15 MED ORDER — SODIUM CHLORIDE 0.9 % IV SOLN
2400.0000 mg/m2 | INTRAVENOUS | Status: DC
  Administered 2022-08-15: 5000 mg via INTRAVENOUS
  Filled 2022-08-15: qty 100

## 2022-08-15 MED ORDER — SODIUM CHLORIDE 0.9 % IV SOLN
10.0000 mg | Freq: Once | INTRAVENOUS | Status: AC
  Administered 2022-08-15: 10 mg via INTRAVENOUS
  Filled 2022-08-15: qty 10

## 2022-08-15 MED ORDER — OXALIPLATIN CHEMO INJECTION 100 MG/20ML
85.0000 mg/m2 | Freq: Once | INTRAVENOUS | Status: AC
  Administered 2022-08-15: 165 mg via INTRAVENOUS
  Filled 2022-08-15: qty 33

## 2022-08-15 MED ORDER — DIPHENHYDRAMINE HCL 50 MG/ML IJ SOLN
25.0000 mg | Freq: Once | INTRAMUSCULAR | Status: AC
  Administered 2022-08-15: 25 mg via INTRAVENOUS
  Filled 2022-08-15: qty 1

## 2022-08-15 MED ORDER — FAMOTIDINE IN NACL 20-0.9 MG/50ML-% IV SOLN
20.0000 mg | Freq: Once | INTRAVENOUS | Status: AC
  Administered 2022-08-15: 20 mg via INTRAVENOUS
  Filled 2022-08-15: qty 50

## 2022-08-15 MED ORDER — LEUCOVORIN CALCIUM INJECTION 350 MG
400.0000 mg/m2 | Freq: Once | INTRAVENOUS | Status: AC
  Administered 2022-08-15: 788 mg via INTRAVENOUS
  Filled 2022-08-15: qty 25

## 2022-08-15 NOTE — Patient Instructions (Signed)
[facility]  Discharge Instructions: Thank you for choosing [facility] to provide your oncology and hematology care.   If you have a lab appointment with the Cancer Center, please go directly to the Cancer Center and check in at the registration area.   Wear comfortable clothing and clothing appropriate for easy access to any Portacath or PICC line.   We strive to give you quality time with your provider. You may need to reschedule your appointment if you arrive late (15 or more minutes).  Arriving late affects you and other patients whose appointments are after yours.  Also, if you miss three or more appointments without notifying the office, you may be dismissed from the clinic at the provider's discretion.      For prescription refill requests, have your pharmacy contact our office and allow 72 hours for refills to be completed.    Today you received the following chemotherapy and/or immunotherapy agents: Oxaliplatin, Leucovorin, 5FU      To help prevent nausea and vomiting after your treatment, we encourage you to take your nausea medication as directed.  BELOW ARE SYMPTOMS THAT SHOULD BE REPORTED IMMEDIATELY: *FEVER GREATER THAN 100.4 F (38 C) OR HIGHER *CHILLS OR SWEATING *NAUSEA AND VOMITING THAT IS NOT CONTROLLED WITH YOUR NAUSEA MEDICATION *UNUSUAL SHORTNESS OF BREATH *UNUSUAL BRUISING OR BLEEDING *URINARY PROBLEMS (pain or burning when urinating, or frequent urination) *BOWEL PROBLEMS (unusual diarrhea, constipation, pain near the anus) TENDERNESS IN MOUTH AND THROAT WITH OR WITHOUT PRESENCE OF ULCERS (sore throat, sores in mouth, or a toothache) UNUSUAL RASH, SWELLING OR PAIN  UNUSUAL VAGINAL DISCHARGE OR ITCHING   Items with * indicate a potential emergency and should be followed up as soon as possible or go to the Emergency Department if any problems should occur.  Please show the CHEMOTHERAPY ALERT CARD or IMMUNOTHERAPY  ALERT CARD at check-in to the Emergency Department and triage nurse.  Should you have questions after your visit or need to cancel or reschedule your appointment, please contact [facility]  Dept: [phone]  and follow the prompts.  Office hours are 8:00 a.m. to 4:30 p.m. Monday - Friday. Please note that voicemails left after 4:00 p.m. may not be returned until the following business day.  We are closed weekends and major holidays. You have access to a nurse at all times for urgent questions. Please call the main number to the clinic Dept: [phone] and follow the prompts.   For any non-urgent questions, you may also contact your provider using MyChart. We now offer e-Visits for anyone 18 and older to request care online for non-urgent symptoms. For details visit [email or web address].   Also download the MyChart app! Go to the app store, search "MyChart", open the app, select [place], and log in with your MyChart username and password.   

## 2022-08-16 ENCOUNTER — Encounter: Payer: Self-pay | Admitting: Hematology

## 2022-08-17 ENCOUNTER — Inpatient Hospital Stay: Payer: Commercial Managed Care - HMO

## 2022-08-17 ENCOUNTER — Encounter: Payer: Self-pay | Admitting: Hematology

## 2022-08-17 VITALS — BP 137/89 | HR 84 | Temp 98.9°F | Resp 18

## 2022-08-17 DIAGNOSIS — Z5111 Encounter for antineoplastic chemotherapy: Secondary | ICD-10-CM | POA: Diagnosis not present

## 2022-08-17 DIAGNOSIS — C162 Malignant neoplasm of body of stomach: Secondary | ICD-10-CM

## 2022-08-17 MED ORDER — SODIUM CHLORIDE 0.9% FLUSH
10.0000 mL | INTRAVENOUS | Status: DC | PRN
  Administered 2022-08-17: 10 mL

## 2022-08-17 MED ORDER — HEPARIN SOD (PORK) LOCK FLUSH 100 UNIT/ML IV SOLN
500.0000 [IU] | Freq: Once | INTRAVENOUS | Status: AC | PRN
  Administered 2022-08-17: 500 [IU]

## 2022-08-17 MED ORDER — PEGFILGRASTIM INJECTION 6 MG/0.6ML ~~LOC~~
6.0000 mg | PREFILLED_SYRINGE | Freq: Once | SUBCUTANEOUS | Status: AC
  Administered 2022-08-17: 6 mg via SUBCUTANEOUS
  Filled 2022-08-17: qty 0.6

## 2022-08-23 ENCOUNTER — Encounter: Payer: Self-pay | Admitting: Hematology

## 2022-08-24 ENCOUNTER — Encounter: Payer: Self-pay | Admitting: Hematology

## 2022-08-27 ENCOUNTER — Other Ambulatory Visit: Payer: Self-pay

## 2022-08-27 ENCOUNTER — Ambulatory Visit (HOSPITAL_COMMUNITY)
Admission: RE | Admit: 2022-08-27 | Discharge: 2022-08-27 | Disposition: A | Discharge status: Home / Self Care | Payer: Medicaid Other | Source: Ambulatory Visit | Attending: Hematology | Admitting: Hematology

## 2022-08-27 ENCOUNTER — Inpatient Hospital Stay: Payer: Commercial Managed Care - HMO

## 2022-08-27 ENCOUNTER — Encounter: Payer: Self-pay | Admitting: Hematology

## 2022-08-27 DIAGNOSIS — C162 Malignant neoplasm of body of stomach: Secondary | ICD-10-CM

## 2022-08-27 DIAGNOSIS — Z95828 Presence of other vascular implants and grafts: Secondary | ICD-10-CM

## 2022-08-27 DIAGNOSIS — Z5111 Encounter for antineoplastic chemotherapy: Secondary | ICD-10-CM | POA: Diagnosis not present

## 2022-08-27 LAB — CBC WITH DIFFERENTIAL (CANCER CENTER ONLY)
Abs Immature Granulocytes: 0.08 10*3/uL — ABNORMAL HIGH (ref 0.00–0.07)
Basophils Absolute: 0 10*3/uL (ref 0.0–0.1)
Basophils Relative: 1 %
Eosinophils Absolute: 0.1 10*3/uL (ref 0.0–0.5)
Eosinophils Relative: 1 %
HCT: 39.5 % (ref 36.0–46.0)
Hemoglobin: 13.6 g/dL (ref 12.0–15.0)
Immature Granulocytes: 1 %
Lymphocytes Relative: 17 %
Lymphs Abs: 1 10*3/uL (ref 0.7–4.0)
MCH: 30 pg (ref 26.0–34.0)
MCHC: 34.4 g/dL (ref 30.0–36.0)
MCV: 87.2 fL (ref 80.0–100.0)
Monocytes Absolute: 0.8 10*3/uL (ref 0.1–1.0)
Monocytes Relative: 14 %
Neutro Abs: 3.7 10*3/uL (ref 1.7–7.7)
Neutrophils Relative %: 66 %
Platelet Count: 185 10*3/uL (ref 150–400)
RBC: 4.53 MIL/uL (ref 3.87–5.11)
RDW: 15.8 % — ABNORMAL HIGH (ref 11.5–15.5)
WBC Count: 5.6 10*3/uL (ref 4.0–10.5)
nRBC: 0 % (ref 0.0–0.2)

## 2022-08-27 LAB — CMP (CANCER CENTER ONLY)
ALT: 18 U/L (ref 0–44)
AST: 26 U/L (ref 15–41)
Albumin: 4.3 g/dL (ref 3.5–5.0)
Alkaline Phosphatase: 136 U/L — ABNORMAL HIGH (ref 38–126)
Anion gap: 8 (ref 5–15)
BUN: 7 mg/dL (ref 6–20)
CO2: 29 mmol/L (ref 22–32)
Calcium: 9.5 mg/dL (ref 8.9–10.3)
Chloride: 106 mmol/L (ref 98–111)
Creatinine: 0.67 mg/dL (ref 0.44–1.00)
GFR, Estimated: >60 mL/min (ref >60)
Glucose, Bld: 102 mg/dL — ABNORMAL HIGH (ref 70–99)
Potassium: 3.5 mmol/L (ref 3.5–5.1)
Sodium: 143 mmol/L (ref 135–145)
Total Bilirubin: 0.4 mg/dL (ref 0.3–1.2)
Total Protein: 7.2 g/dL (ref 6.5–8.1)

## 2022-08-27 MED ORDER — SODIUM CHLORIDE 0.9% FLUSH
10.0000 mL | Freq: Once | INTRAVENOUS | Status: AC
  Administered 2022-08-27: 10 mL

## 2022-08-27 MED ORDER — HEPARIN SOD (PORK) LOCK FLUSH 100 UNIT/ML IV SOLN
INTRAVENOUS | Status: AC
  Filled 2022-08-27: qty 5

## 2022-08-27 MED ORDER — IOHEXOL 300 MG/ML  SOLN
100.0000 mL | Freq: Once | INTRAMUSCULAR | Status: AC | PRN
  Administered 2022-08-27: 100 mL via INTRAVENOUS

## 2022-08-27 MED ORDER — SODIUM CHLORIDE (PF) 0.9 % IJ SOLN
INTRAMUSCULAR | Status: AC
  Filled 2022-08-27: qty 50

## 2022-08-27 MED ORDER — HEPARIN SOD (PORK) LOCK FLUSH 100 UNIT/ML IV SOLN
500.0000 [IU] | Freq: Once | INTRAVENOUS | Status: AC
  Administered 2022-08-27: 500 [IU] via INTRAVENOUS

## 2022-08-28 MED FILL — Dexamethasone Sodium Phosphate Inj 100 MG/10ML: INTRAMUSCULAR | Qty: 1 | Status: AC

## 2022-08-28 NOTE — Assessment & Plan Note (Signed)
[id number] with peritoneal metastasis. 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   -she has started first line chemo FLOT on 1/10 -She understands that chemotherapy is palliative, to prolong her life.  We are unlikely going to cure her cancer. -PD-L1 0-1%, no significant benefit from PD-L1 immunotherapy, FO revealed FGFR2 amplification (+), no other targeted therapy available  -She has been tolerating chemo very well, will continue for now  -PET scan from 05/30/2022 was negative for primary tumor or metastatic disease, the known peritoneal mets did not show on PET.  -Foundation One showed no targetable mutations  -she has been tolerating chemo well overall. Due to fatigue, I have changed her chemo from FLOT to FOLFOX on 06/20/2022, she tolerated well -she previously asked the role of surgery, depends on her next restaging CT scan findings, I may refer her to [facility] or [facility] to discuss HIPEC surgery  -due to her infusion reaction to oxaliplatin on C5, we added additional premeds and gave slow infusion over 4 hours for cycle 6 and she tolerated well  -She is not able to return to work due to the cancer and treatment related symptoms.  -She is tolerating FOLFOX well overall, with moderate fatigue for a few days after infusion but able to recover well.  No signs of neuropathy at this point. -will repeat restaging CT

## 2022-08-28 NOTE — Progress Notes (Unsigned)
[facility]   [phone] [phone]   Clinic Follow up Note   Patient Care Team: [name], MD as PCP - General (Internal Medicine) [name], MD as Consulting Physician (Oncology)  Date of Service:  08/29/2022  CHIEF COMPLAINT: f/u of Gastric Cancer   CURRENT THERAPY:   FOLFOX q14d X 12 cycles      ASSESSMENT:  Sheryl [name] is a 57 y.o. 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   -she has started first line chemo FLOT on 1/10 -She understands that chemotherapy is palliative, to prolong her life.  We are unlikely going to cure her cancer. -PD-L1 0-1%, no significant benefit from PD-L1 immunotherapy, FO revealed FGFR2 amplification (+), no other targeted therapy available  -She has been tolerating chemo very well, will continue for now  -PET scan from 05/30/2022 was negative for primary tumor or metastatic disease, the known peritoneal mets did not show on PET.  -Foundation One showed no targetable mutations  -she has been tolerating chemo well overall. Due to fatigue, I have changed her chemo from FLOT to FOLFOX on 06/20/2022, she tolerated well -she previously asked the role of surgery, depends on her next restaging CT scan findings, I may refer her to [facility] to discuss HIPEC surgery  -due to her infusion reaction to oxaliplatin on C5, we added additional premeds and gave slow infusion over 4 hours for cycle 6 and she tolerated well  -She is not able to return to work due to the cancer and treatment related symptoms.  -She is tolerating FOLFOX well overall, with moderate fatigue for a few days after infusion but able to recover well.  No signs of neuropathy at this point. -Restaging CT abdomen pelvis  from August 27, 2022 showed no residual disease.  I personally reviewed her scan images and discussed findings with patient and her husband. -We discussed maintenance therapy with Xeloda on the road, we will stop oxaliplatin when she develops side effects especially neuropathy, or up to additional 3 months.  She agrees with the plan.    PLAN: -lab reviewed -continue Folfox pt tolerating well for another 3 months -Discuss CT scan-stable -proceed with C6 FOLFOX today -Postpone treatment 6/12 due to family vacation -lab/flush treatment 5/17     SUMMARY OF ONCOLOGIC HISTORY: Oncology History Overview Note   Cancer Staging  Gastric cancer [facility]) Staging form: Stomach, AJCC 8th Edition - Clinical stage from 04/19/2022: Stage IVB (cT2, cN0, pM1) - Signed by [name], MD on 05/08/2022 Total positive nodes: 0     Gastric cancer (HCC)  03/30/2022 Procedure   EGD:  Impression:  - Normal esophagus. - A few gastric polyps. Biopsied. - Gastritis. Biopsied. - Non-bleeding gastric ulcer with no stigmata of bleeding. Biopsied. - Normal examined duodenum. Biopsied.  Findings: Diffuse moderate inflammation characterized by congestion (edema), friability and granularity was found in the cardia, in the gastric fundus and in the gastric body. There were associated erosions in multiple places. Biopsies were taken from the antrum, body, and fundus with a cold forceps for histology. Estimated blood loss was minimal.  One non-bleeding cratered gastric ulcer with no stigmata of bleeding was found on the greater curvature of the stomach. The lesion was 6 mm in largest dimension. The mucosa around the ulcer  was heaped and led to some deformity in the antrum. Biopsies were taken with a cold forceps for histology. Estimated blood loss was minimal.    03/30/2022 Pathology Results   [name], Sheryl [name]  Accession: [id number]  Diagnosis 1. Surgical [P], duodenal - BENIGN SMALL BOWEL MUCOSA WITH  NO SIGNIFICANT PATHOLOGIC CHANGES 2. Surgical [P], gastric antrum - GASTRIC ANTRAL MUCOSA WITH FEATURES OF REACTIVE GASTROPATHY - NEGATIVE FOR H. PYLORI ON H&E STAIN - NEGATIVE FOR INTESTINAL METAPLASIA OR MALIGNANCY 3. Surgical [P], gastric body - GASTRIC OXYNTIC MUCOSA WITH REACTIVE/REPARATIVE CHANGES - NEGATIVE FOR H. PYLORI ON H&E STAIN - NEGATIVE FOR INTESTINAL METAPLASIA, DYSPLASIA OR MALIGNANCY 4. Surgical [P], greater curve ulceration - ADENOCARCINOMA WITH SIGNET RING CELL FEATURES (SEE NOTE) 5. Surgical [P], gastric polyps - ADENOCARCINOMA WITH SIGNET RING CELL FEATURES (SEE NOTE) 6. Surgical [P], fundus (gastric) - ADENOCARCINOMA WITH SIGNET RING CELL FEATURES (SEE NOTE) 7. Surgical [P], colon, ascending, polyp (1) - TUBULAR ADENOMA. - NO HIGH GRADE DYSPLASIA OR MALIGNANCY. 8. Surgical [P], colon, transverse, polyp (1) - TUBULAR ADENOMA. - NO HIGH GRADE DYSPLASIA OR MALIGNANCY.    04/13/2022 Initial Diagnosis   Gastric cancer (HCC)   04/19/2022 Cancer Staging   Staging form: Stomach, AJCC 8th Edition - Clinical stage from 04/19/2022: Stage IVB (cT2, cN0, pM1) - Signed by [name], MD on 05/08/2022 Total positive nodes: 0   05/05/2022 Genetic Testing   Negative genetic testing on the Multi-cancer gene panel + RNA.  FH c.259C>T VUS identified.  The report date is May 05, 2022.  The Multi-Cancer + RNA Panel offered by [facility] includes sequencing and/or deletion/duplication analysis of the following 70 genes:  AIP*, ALK, APC*, ATM*, AXIN2*, BAP1*, BARD1*, BLM*, BMPR1A*, BRCA1*, BRCA2*, BRIP1*, CDC73*, CDH1*, CDK4, CDKN1B*, CDKN2A, CHEK2*, CTNNA1*, DICER1*, EPCAM (del/dup only), EGFR, FH*, FLCN*, GREM1 (promoter dup only), HOXB13, KIT, LZTR1, MAX*, MBD4, MEN1*, MET, MITF, MLH1*, MSH2*, MSH3*, MSH6*, MUTYH*, NF1*, NF2*, NTHL1*, PALB2*, PDGFRA, PMS2*, POLD1*, POLE*, POT1*, PRKAR1A*, PTCH1*, PTEN*, RAD51C*, RAD51D*, RB1*, RET, SDHA* (sequencing only), SDHAF2*, SDHB*, SDHC*, SDHD*,  SMAD4*, SMARCA4*, SMARCB1*, SMARCE1*, STK11*, SUFU*, TMEM127*, TP53*, TSC1*, TSC2*, VHL*. RNA analysis is performed for * genes.    05/09/2022 - 06/07/2022 Chemotherapy   Patient is on Treatment Plan : GASTROESOPHAGEAL FLOT q14d X 4 cycles      Miscellaneous   Foundation One  Biomarker Findings Microsatellite status- Cannot be determined Tumor Mutational Burden- Cannot be determined  Genomic Findings  FGFR2 amplification,FGFR2-TACC2 fusion,  Rearrangement intron 17 ARAF amplification CCND3 amplification TP53 [id number]*74     05/30/2022 Imaging    IMPRESSION: 1. Mild hypermetabolism corresponding to a dominant left upper quadrant mass and smaller perigastric nodules or nodes. 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 Recommend attention to the dominant left upper quadrant soft tissue mass on follow-up exams to exclude unlikely recurrent lymphoma. 2. No gastric hypermetabolism and no typical findings of metastatic disease.   06/20/2022 -  Chemotherapy   Patient is on Treatment Plan : GASTRIC FOLFOX q14d x 12 cycles     08/27/2022 Imaging    IMPRESSION: No focal gastric mass on CT.   No findings suspicious for recurrent or metastatic disease.   Stable left upper abdominal soft tissue lesion and small lymph nodes, chronic, favoring treated lymphoma.      INTERVAL HISTORY:  Sheryl [name] is here for a follow up of Gastric Cancer. She was last seen by me on 08/15/2022. She presents to the clinic accompanied  by father. Pt state that when she comes for pump d/c she is basically sleeping all weekend. Saturday and Sunday. Monday she is fine. Pt denies having numbness and tingling. Pt state that her taste buds are different.   All other systems were reviewed with the patient and are negative.  MEDICAL HISTORY:  Past Medical History:  Diagnosis Date   Blood transfusion without reported diagnosis    had transfusion with hysterectomy    Cataract    Colon polyps 2012   Diabetes (HCC) 03/13/2021   Diabetes (HCC) 05/21/2019   Family history of breast cancer    Family history of pancreatic cancer    Family history of stomach cancer    Fibroid    GERD (gastroesophageal reflux disease)    H/O blood clots    History of hysterectomy    fibroids and heavy cycles   Hypertension     SURGICAL HISTORY: Past Surgical History:  Procedure Laterality Date   ABDOMINAL HYSTERECTOMY     BIOPSY  04/19/2022   Procedure: BIOPSY;  Surgeon: Lemar Lofty., MD;  Location: [facility];  Service: Gastroenterology;;   COLONOSCOPY     ESOPHAGOGASTRODUODENOSCOPY (EGD) WITH PROPOFOL N/A 04/19/2022   Procedure: ESOPHAGOGASTRODUODENOSCOPY (EGD) WITH PROPOFOL;  Surgeon: Lemar Lofty., MD;  Location: [facility];  Service: Gastroenterology;  Laterality: N/A;   EUS N/A 04/19/2022   Procedure: UPPER ENDOSCOPIC ULTRASOUND (EUS) RADIAL;  Surgeon: Lemar Lofty., MD;  Location: [facility];  Service: Gastroenterology;  Laterality: N/A;   EXCISION OF SKIN TAG  05/03/2022   Procedure: EXCISION OF CHEST WALL SKIN LESION;  Surgeon: Fritzi Mandes, MD;  Location: [facility] OR;  Service: General;;   LAPAROSCOPY N/A 05/03/2022   Procedure: LAPAROSCOPY DIAGNOSTIC WITH PERITONEAL WASHINGS;  Surgeon: Fritzi Mandes, MD;  Location: [facility] OR;  Service: General;  Laterality: N/A;   POLYPECTOMY  04/19/2022   Procedure: POLYPECTOMY;  Surgeon: Lemar Lofty., MD;  Location: [facility];  Service: Gastroenterology;;   PORTACATH PLACEMENT N/A 05/03/2022   Procedure: INSERTION PORT-A-CATH WITH ULTRASOUND GUIDANCE;  Surgeon: Fritzi Mandes, MD;  Location: [facility] OR;  Service: General;  Laterality: N/A;   UPPER GASTROINTESTINAL ENDOSCOPY      I have reviewed the social history and family history with the patient and they are unchanged from previous note.  ALLERGIES:  is allergic to aspirin, cyclobenzaprine, naproxen sodium, zithromax [azithromycin  dihydrate], oxaliplatin, and dilaudid [hydromorphone].  MEDICATIONS:  Current Outpatient Medications  Medication Sig Dispense Refill   acetaminophen (TYLENOL) 500 MG tablet Take 2 tablets (1,000 mg total) by mouth every 8 (eight) hours as needed (pain). 30 tablet 1   amLODipine (NORVASC) 10 MG tablet Take 1 tablet (10 mg total) by mouth daily. 90 tablet 1   Bacillus Coagulans-Inulin (PROBIOTIC-PREBIOTIC) 1-250 BILLION-MG CAPS Take 2 capsules by mouth daily. 30 capsule 1   Biotin 1000 MCG CHEW Chew 1,000 mcg by mouth daily. 30 tablet 2   Blood Glucose Monitoring Suppl (TRUE METRIX METER) w/Device KIT 1 kit by Does not apply route 3 (three) times daily as needed. 1 kit 0   Cholecalciferol (VITAMIN D3) 125 MCG (5000 UT) TABS Take 1 tablet (5,000 Units total) by mouth daily. 30 tablet 2   diphenhydrAMINE-prednisoLONE-nystatin in lidocaine solution Take 5 mls by mouth 3 (three) times daily as needed for mouth pain. 140 mL 0   ondansetron (ZOFRAN-ODT) 4 MG disintegrating tablet Take 1 tablet (4 mg total) by mouth every 6 (six) hours. Dissolve one tab on tongue 30  minutes before each dose of bowel prep (Patient not taking: Reported on 04/18/2022) 2 tablet 0   pantoprazole (PROTONIX) 40 MG tablet Take 1 tablet (40 mg total) by mouth 2 (two) times daily. 180 tablet 0   prochlorperazine (COMPAZINE) 10 MG tablet Take 1 tablet (10 mg total) by mouth every 6 (six) hours as needed for nausea or vomiting. 30 tablet 1   rosuvastatin (CRESTOR) 5 MG tablet Take 1 tablet (5 mg total) by mouth daily. (Patient not taking: Reported on 04/18/2022) 90 tablet 1   sucralfate (CARAFATE) 1 g tablet Take 1 tablet (1 g total) by mouth 2 (two) times daily. 60 tablet 6   Current Facility-Administered Medications  Medication Dose Route Frequency Provider Last Rate Last Admin   0.9 %  sodium chloride infusion  500 mL Intravenous Continuous Tressia Danas, MD       Facility-Administered Medications Ordered in Other Visits   Medication Dose Route Frequency Provider Last Rate Last Admin   fluorouracil (ADRUCIL) 5,000 mg in sodium chloride 0.9 % 150 mL chemo infusion  2,400 mg/m2 (Treatment Plan Recorded) Intravenous 1 day or 1 dose [name], MD   Infusion Verify at 08/29/22 1553    PHYSICAL EXAMINATION: ECOG PERFORMANCE STATUS: 1 - Symptomatic but completely ambulatory  Vitals:   08/29/22 0916  BP: 128/89  Pulse: 82  Resp: 18  Temp: 98.4 F (36.9 C)  SpO2: 100%   Wt Readings from Last 3 Encounters:  08/29/22 193 lb 9.6 oz (87.8 kg)  08/15/22 190 lb 14.4 oz (86.6 kg)  08/01/22 190 lb 1.6 oz (86.2 kg)     GENERAL:alert, no distress and comfortable SKIN: skin color normal, no rashes or significant lesions EYES: normal, Conjunctiva are pink and non-injected, sclera clear  NEURO: alert & oriented x 3 with fluent speech  LABORATORY DATA:  I have reviewed the data as listed    Latest Ref Rng & Units 08/27/2022    7:37 AM 08/15/2022    8:26 AM 08/01/2022    7:48 AM  CBC  WBC 4.0 - 10.5 K/uL 5.6  3.3  3.0   Hemoglobin 12.0 - 15.0 g/dL 16.1  09.6  04.5   Hematocrit 36.0 - 46.0 % 39.5  37.5  37.4   Platelets 150 - 400 K/uL 185  221  232         Latest Ref Rng & Units 08/27/2022    7:37 AM 08/15/2022    8:26 AM 08/01/2022    7:48 AM  CMP  Glucose 70 - 99 mg/dL 409  811  914   BUN 6 - 20 mg/dL 7  13  13    Creatinine 0.44 - 1.00 mg/dL 7.82  9.56  2.13   Sodium 135 - 145 mmol/L 143  140  138   Potassium 3.5 - 5.1 mmol/L 3.5  3.6  3.6   Chloride 98 - 111 mmol/L 106  105  105   CO2 22 - 32 mmol/L 29  28  27    Calcium 8.9 - 10.3 mg/dL 9.5  9.4  9.4   Total Protein 6.5 - 8.1 g/dL 7.2  7.2  7.0   Total Bilirubin 0.3 - 1.2 mg/dL 0.4  0.7  0.7   Alkaline Phos 38 - 126 U/L 136  109  105   AST 15 - 41 U/L 26  23  19    ALT 0 - 44 U/L 18  15  14        RADIOGRAPHIC STUDIES: I have personally reviewed  the radiological images as listed and agreed with the findings in the report. No results found.     Orders Placed This Encounter  Procedures   CBC with Differential (Cancer Center Only)    Standing Status:   Future    Standing Expiration Date:   10/17/2023   CMP (Cancer Center only)    Standing Status:   Future    Standing Expiration Date:   10/17/2023   CBC with Differential (Cancer Center Only)    Standing Status:   Future    Standing Expiration Date:   10/31/2023   CMP (Cancer Center only)    Standing Status:   Future    Standing Expiration Date:   10/31/2023   All questions were answered. The patient knows to call the clinic with any problems, questions or concerns. No barriers to learning was detected. The total time spent in the appointment was 30 minutes.     [name], MD 08/29/2022   Carolin Coy, CMA, am acting as scribe for [name], MD.   I have reviewed the above documentation for accuracy and completeness, and I agree with the above.

## 2022-08-29 ENCOUNTER — Other Ambulatory Visit: Payer: Self-pay

## 2022-08-29 ENCOUNTER — Inpatient Hospital Stay: Payer: Medicaid Other

## 2022-08-29 ENCOUNTER — Other Ambulatory Visit: Payer: Commercial Managed Care - HMO

## 2022-08-29 ENCOUNTER — Other Ambulatory Visit: Payer: Self-pay | Admitting: Nurse Practitioner

## 2022-08-29 ENCOUNTER — Encounter: Payer: Self-pay | Admitting: Hematology

## 2022-08-29 ENCOUNTER — Inpatient Hospital Stay: Payer: Medicaid Other | Attending: Physician Assistant | Admitting: Hematology

## 2022-08-29 VITALS — BP 128/89 | HR 82 | Temp 98.4°F | Resp 18 | Ht 68.0 in | Wt 193.6 lb

## 2022-08-29 DIAGNOSIS — Z5189 Encounter for other specified aftercare: Secondary | ICD-10-CM | POA: Insufficient documentation

## 2022-08-29 DIAGNOSIS — T451X5A Adverse effect of antineoplastic and immunosuppressive drugs, initial encounter: Secondary | ICD-10-CM | POA: Diagnosis not present

## 2022-08-29 DIAGNOSIS — C162 Malignant neoplasm of body of stomach: Secondary | ICD-10-CM

## 2022-08-29 DIAGNOSIS — C169 Malignant neoplasm of stomach, unspecified: Secondary | ICD-10-CM | POA: Diagnosis not present

## 2022-08-29 DIAGNOSIS — Z5111 Encounter for antineoplastic chemotherapy: Secondary | ICD-10-CM | POA: Diagnosis present

## 2022-08-29 DIAGNOSIS — C786 Secondary malignant neoplasm of retroperitoneum and peritoneum: Secondary | ICD-10-CM | POA: Diagnosis not present

## 2022-08-29 DIAGNOSIS — R112 Nausea with vomiting, unspecified: Secondary | ICD-10-CM | POA: Insufficient documentation

## 2022-08-29 MED ORDER — LEUCOVORIN CALCIUM INJECTION 350 MG
400.0000 mg/m2 | Freq: Once | INTRAVENOUS | Status: AC
  Administered 2022-08-29: 788 mg via INTRAVENOUS
  Filled 2022-08-29: qty 39.4

## 2022-08-29 MED ORDER — FAMOTIDINE 20 MG IN NS 100 ML IVPB
20.0000 mg | Freq: Once | INTRAVENOUS | Status: AC
  Administered 2022-08-29: 20 mg via INTRAVENOUS
  Filled 2022-08-29: qty 100

## 2022-08-29 MED ORDER — SODIUM CHLORIDE 0.9 % IV SOLN
10.0000 mg | Freq: Once | INTRAVENOUS | Status: AC
  Administered 2022-08-29: 10 mg via INTRAVENOUS
  Filled 2022-08-29: qty 10

## 2022-08-29 MED ORDER — OXALIPLATIN CHEMO INJECTION 100 MG/20ML
85.0000 mg/m2 | Freq: Once | INTRAVENOUS | Status: AC
  Administered 2022-08-29: 165 mg via INTRAVENOUS
  Filled 2022-08-29: qty 33

## 2022-08-29 MED ORDER — DEXTROSE 5 % IV SOLN: Freq: Once | INTRAVENOUS | Status: AC

## 2022-08-29 MED ORDER — SODIUM CHLORIDE 0.9 % IV SOLN
2400.0000 mg/m2 | INTRAVENOUS | Status: DC
  Administered 2022-08-29: 5000 mg via INTRAVENOUS
  Filled 2022-08-29: qty 100

## 2022-08-29 MED ORDER — LEUCOVORIN CALCIUM INJECTION 350 MG
400.0000 mg/m2 | Freq: Once | INTRAVENOUS | Status: DC
  Filled 2022-08-29: qty 39.4

## 2022-08-29 MED ORDER — PALONOSETRON HCL INJECTION 0.25 MG/5ML
0.2500 mg | Freq: Once | INTRAVENOUS | Status: AC
  Administered 2022-08-29: 0.25 mg via INTRAVENOUS
  Filled 2022-08-29: qty 5

## 2022-08-29 MED ORDER — DIPHENHYDRAMINE HCL 50 MG/ML IJ SOLN
25.0000 mg | Freq: Once | INTRAMUSCULAR | Status: AC
  Administered 2022-08-29: 25 mg via INTRAVENOUS
  Filled 2022-08-29: qty 1

## 2022-08-29 NOTE — Patient Instructions (Signed)
[facility]  Discharge Instructions: Thank you for choosing [facility] to provide your oncology and hematology care.   If you have a lab appointment with the Cancer Center, please go directly to the Cancer Center and check in at the registration area.   Wear comfortable clothing and clothing appropriate for easy access to any Portacath or PICC line.   We strive to give you quality time with your provider. You may need to reschedule your appointment if you arrive late (15 or more minutes).  Arriving late affects you and other patients whose appointments are after yours.  Also, if you miss three or more appointments without notifying the office, you may be dismissed from the clinic at the provider's discretion.      For prescription refill requests, have your pharmacy contact our office and allow 72 hours for refills to be completed.    Today you received the following chemotherapy and/or immunotherapy agents: Oxaliplatin, Leucovorin, 5FU      To help prevent nausea and vomiting after your treatment, we encourage you to take your nausea medication as directed.  BELOW ARE SYMPTOMS THAT SHOULD BE REPORTED IMMEDIATELY: *FEVER GREATER THAN 100.4 F (38 C) OR HIGHER *CHILLS OR SWEATING *NAUSEA AND VOMITING THAT IS NOT CONTROLLED WITH YOUR NAUSEA MEDICATION *UNUSUAL SHORTNESS OF BREATH *UNUSUAL BRUISING OR BLEEDING *URINARY PROBLEMS (pain or burning when urinating, or frequent urination) *BOWEL PROBLEMS (unusual diarrhea, constipation, pain near the anus) TENDERNESS IN MOUTH AND THROAT WITH OR WITHOUT PRESENCE OF ULCERS (sore throat, sores in mouth, or a toothache) UNUSUAL RASH, SWELLING OR PAIN  UNUSUAL VAGINAL DISCHARGE OR ITCHING   Items with * indicate a potential emergency and should be followed up as soon as possible or go to the Emergency Department if any problems should occur.  Please show the CHEMOTHERAPY ALERT CARD or IMMUNOTHERAPY  ALERT CARD at check-in to the Emergency Department and triage nurse.  Should you have questions after your visit or need to cancel or reschedule your appointment, please contact [facility]  Dept: [phone]  and follow the prompts.  Office hours are 8:00 a.m. to 4:30 p.m. Monday - Friday. Please note that voicemails left after 4:00 p.m. may not be returned until the following business day.  We are closed weekends and major holidays. You have access to a nurse at all times for urgent questions. Please call the main number to the clinic Dept: [phone] and follow the prompts.   For any non-urgent questions, you may also contact your provider using MyChart. We now offer e-Visits for anyone 18 and older to request care online for non-urgent symptoms. For details visit [email or web address].   Also download the MyChart app! Go to the app store, search "MyChart", open the app, select [place], and log in with your MyChart username and password.   

## 2022-08-30 ENCOUNTER — Other Ambulatory Visit: Payer: Self-pay

## 2022-08-30 ENCOUNTER — Other Ambulatory Visit (HOSPITAL_BASED_OUTPATIENT_CLINIC_OR_DEPARTMENT_OTHER): Payer: Self-pay

## 2022-08-30 MED ORDER — PROCHLORPERAZINE MALEATE 10 MG PO TABS
10.0000 mg | ORAL_TABLET | Freq: Four times a day (QID) | ORAL | 1 refills | Status: DC | PRN
Start: 2022-08-30 — End: 2022-09-27
  Filled 2022-08-30: qty 30, 8d supply, fill #0
  Filled 2022-09-13: qty 30, 8d supply, fill #1

## 2022-08-31 ENCOUNTER — Inpatient Hospital Stay: Payer: Medicaid Other

## 2022-08-31 ENCOUNTER — Other Ambulatory Visit: Payer: Self-pay

## 2022-08-31 ENCOUNTER — Encounter: Payer: Self-pay | Admitting: Hematology

## 2022-08-31 VITALS — BP 140/94 | HR 86 | Resp 18

## 2022-08-31 DIAGNOSIS — C162 Malignant neoplasm of body of stomach: Secondary | ICD-10-CM

## 2022-08-31 DIAGNOSIS — Z5111 Encounter for antineoplastic chemotherapy: Secondary | ICD-10-CM | POA: Diagnosis not present

## 2022-08-31 MED ORDER — PEGFILGRASTIM INJECTION 6 MG/0.6ML ~~LOC~~
6.0000 mg | PREFILLED_SYRINGE | Freq: Once | SUBCUTANEOUS | Status: AC
  Administered 2022-08-31: 6 mg via SUBCUTANEOUS
  Filled 2022-08-31: qty 0.6

## 2022-08-31 MED ORDER — SODIUM CHLORIDE 0.9% FLUSH
10.0000 mL | INTRAVENOUS | Status: DC | PRN
  Administered 2022-08-31: 10 mL

## 2022-08-31 MED ORDER — HEPARIN SOD (PORK) LOCK FLUSH 100 UNIT/ML IV SOLN
500.0000 [IU] | Freq: Once | INTRAVENOUS | Status: AC | PRN
  Administered 2022-08-31: 500 [IU]

## 2022-09-11 MED FILL — Dexamethasone Sodium Phosphate Inj 100 MG/10ML: INTRAMUSCULAR | Qty: 1 | Status: AC

## 2022-09-11 NOTE — Assessment & Plan Note (Signed)
[id number] with peritoneal metastasis. 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   -she has started first line chemo FLOT on 1/10 -She understands that chemotherapy is palliative, to prolong her life.  We are unlikely going to cure her cancer. -PD-L1 0-1%, no significant benefit from PD-L1 immunotherapy, FO revealed FGFR2 amplification (+), no other targeted therapy available  -She has been tolerating chemo very well, will continue for now  -PET scan from 05/30/2022 was negative for primary tumor or metastatic disease, the known peritoneal mets did not show on PET.  -Foundation One showed no targetable mutations  -she has been tolerating chemo well overall. Due to fatigue, I have changed her chemo from FLOT to FOLFOX on 06/20/2022, she tolerated well -she previously asked the role of surgery, depends on her next restaging CT scan findings, I may refer her to [facility] or [facility] to discuss HIPEC surgery  -due to her infusion reaction to oxaliplatin on C5, we added additional premeds and gave slow infusion over 4 hours for cycle 6 and she tolerated well  -She is not able to return to work due to the cancer and treatment related symptoms.  -She is tolerating FOLFOX well overall, with moderate fatigue for a few days after infusion but able to recover well.  No signs of neuropathy at this point. -Restaging CT abdomen pelvis from August 27, 2022 showed no residual disease.  I personally reviewed her scan images and discussed findings with patient and her husband. -We discussed maintenance therapy with Xeloda down the road, we will stop oxaliplatin when she develops side effects especially neuropathy, or up to additional 3 months.  She agrees with the plan.

## 2022-09-11 NOTE — Progress Notes (Unsigned)
[facility]   [phone] Fax:([phone]   Clinic Follow up Note   Patient Care Team: [name], MD as PCP - General (Internal Medicine) [name], MD as Consulting Physician (Oncology)  Date of Service:  09/12/2022  CHIEF COMPLAINT: f/u of Gastric Cancer   CURRENT THERAPY:  FOLFOX q14d X 12 cycles    ASSESSMENT:  Sheryl [name] is a 57 y.o. 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   -she has started first line chemo FLOT on 1/10 -She understands that chemotherapy is palliative, to prolong her life.  We are unlikely going to cure her cancer. -PD-L1 0-1%, no significant benefit from PD-L1 immunotherapy, FO revealed FGFR2 amplification (+), no other targeted therapy available  -She has been tolerating chemo very well, will continue for now  -PET scan from 05/30/2022 was negative for primary tumor or metastatic disease, the known peritoneal mets did not show on PET.  -Foundation One showed no targetable mutations  -she has been tolerating chemo well overall. Due to fatigue, I have changed her chemo from FLOT to FOLFOX on 06/20/2022, she tolerated well -she previously asked the role of surgery, depends on her next restaging CT scan findings, I may refer her to [facility] or [facility] to discuss HIPEC surgery  -due to her infusion reaction to oxaliplatin on C5, we added additional premeds and gave slow infusion over 4 hours for cycle 6 and she tolerated well  -She is not able to return to work due to the cancer and treatment related symptoms.  -She is tolerating FOLFOX well overall, with moderate fatigue for a few days after infusion but able to recover well.  No signs of neuropathy at this point. -Restaging CT abdomen pelvis from  August 27, 2022 showed no residual disease.  I personally reviewed her scan images and discussed findings with patient and her husband. -We discussed maintenance therapy with Xeloda down the road, we will stop oxaliplatin when she develops side effects especially neuropathy, or up to additional 3 months.  She agrees with the plan. -She has been having anorexia, nausea, intermittent vomiting for 3 days after chemo, I will reduce oxaliplatin dose to 60 mg/m, she has no neuropathy so far, will continue FOLFOX for now.    PLAN: -lab reviewed -proceed with  C7 Folfox and reduce dose on the oxaliplatin due to nausea -after the Ct scan in  -lab/flush and folfox -09/26/2022  SUMMARY OF ONCOLOGIC HISTORY: Oncology History Overview Note   Cancer Staging  Gastric cancer [facility]) Staging form: Stomach, AJCC 8th Edition - Clinical stage from 04/19/2022: Stage IVB (cT2, cN0, pM1) - Signed by [name], MD on 05/08/2022 Total positive nodes: 0     Gastric cancer (HCC)  03/30/2022 Procedure   EGD:  Impression:  - Normal esophagus. - A few gastric polyps. Biopsied. - Gastritis. Biopsied. - Non-bleeding gastric ulcer with no stigmata of bleeding. Biopsied. - Normal examined duodenum. Biopsied.  Findings: Diffuse moderate inflammation characterized by congestion (edema), friability and granularity was found in the cardia, in the gastric fundus and in the gastric body. There were associated erosions in multiple places. Biopsies were taken from the antrum, body, and fundus with a cold forceps for histology. Estimated blood loss was minimal.  One non-bleeding cratered gastric ulcer with no stigmata of  bleeding was found on the greater curvature of the stomach. The lesion was 6 mm in largest dimension. The mucosa around the ulcer was heaped and led to some deformity in the antrum. Biopsies were taken with a cold forceps for histology. Estimated blood loss was minimal.    03/30/2022 Pathology Results    [name]  Accession: [id number]  Diagnosis 1. Surgical [P], duodenal - BENIGN SMALL BOWEL MUCOSA WITH NO SIGNIFICANT PATHOLOGIC CHANGES 2. Surgical [P], gastric antrum - GASTRIC ANTRAL MUCOSA WITH FEATURES OF REACTIVE GASTROPATHY - NEGATIVE FOR H. PYLORI ON H&E STAIN - NEGATIVE FOR INTESTINAL METAPLASIA OR MALIGNANCY 3. Surgical [P], gastric body - GASTRIC OXYNTIC MUCOSA WITH REACTIVE/REPARATIVE CHANGES - NEGATIVE FOR H. PYLORI ON H&E STAIN - NEGATIVE FOR INTESTINAL METAPLASIA, DYSPLASIA OR MALIGNANCY 4. Surgical [P], greater curve ulceration - ADENOCARCINOMA WITH SIGNET RING CELL FEATURES (SEE NOTE) 5. Surgical [P], gastric polyps - ADENOCARCINOMA WITH SIGNET RING CELL FEATURES (SEE NOTE) 6. Surgical [P], fundus (gastric) - ADENOCARCINOMA WITH SIGNET RING CELL FEATURES (SEE NOTE) 7. Surgical [P], colon, ascending, polyp (1) - TUBULAR ADENOMA. - NO HIGH GRADE DYSPLASIA OR MALIGNANCY. 8. Surgical [P], colon, transverse, polyp (1) - TUBULAR ADENOMA. - NO HIGH GRADE DYSPLASIA OR MALIGNANCY.    04/13/2022 Initial Diagnosis   Gastric cancer (HCC)   04/19/2022 Cancer Staging   Staging form: Stomach, AJCC 8th Edition - Clinical stage from 04/19/2022: Stage IVB (cT2, cN0, pM1) - Signed by [name], MD on 05/08/2022 Total positive nodes: 0   05/05/2022 Genetic Testing   Negative genetic testing on the Multi-cancer gene panel + RNA.  FH c.259C>T VUS identified.  The report date is May 05, 2022.  The Multi-Cancer + RNA Panel offered by [facility] includes sequencing and/or deletion/duplication analysis of the following 70 genes:  AIP*, ALK, APC*, ATM*, AXIN2*, BAP1*, BARD1*, BLM*, BMPR1A*, BRCA1*, BRCA2*, BRIP1*, CDC73*, CDH1*, CDK4, CDKN1B*, CDKN2A, CHEK2*, CTNNA1*, DICER1*, EPCAM (del/dup only), EGFR, FH*, FLCN*, GREM1 (promoter dup only), HOXB13, KIT, LZTR1, MAX*, MBD4, MEN1*, MET, MITF, MLH1*, MSH2*, MSH3*, MSH6*, MUTYH*, NF1*, NF2*, NTHL1*, PALB2*, PDGFRA, PMS2*,  POLD1*, POLE*, POT1*, PRKAR1A*, PTCH1*, PTEN*, RAD51C*, RAD51D*, RB1*, RET, SDHA* (sequencing only), SDHAF2*, SDHB*, SDHC*, SDHD*, SMAD4*, SMARCA4*, SMARCB1*, SMARCE1*, STK11*, SUFU*, TMEM127*, TP53*, TSC1*, TSC2*, VHL*. RNA analysis is performed for * genes.    05/09/2022 - 06/07/2022 Chemotherapy   Patient is on Treatment Plan : GASTROESOPHAGEAL FLOT q14d X 4 cycles      Miscellaneous   Foundation One  Biomarker Findings Microsatellite status- Cannot be determined Tumor Mutational Burden- Cannot be determined  Genomic Findings  FGFR2 amplification,FGFR2-TACC2 fusion,  Rearrangement intron 17 ARAF amplification CCND3 amplification TP53 [id number]*74     05/30/2022 Imaging    IMPRESSION: 1. Mild hypermetabolism corresponding to a dominant left upper quadrant mass and smaller perigastric nodules or nodes. 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 Recommend attention to the dominant left upper quadrant soft tissue mass on follow-up exams to exclude unlikely recurrent lymphoma. 2. No gastric hypermetabolism and no typical findings of metastatic disease.   06/20/2022 -  Chemotherapy   Patient is on Treatment Plan : GASTRIC FOLFOX q14d x 12 cycles     08/27/2022 Imaging    IMPRESSION: No focal gastric mass on CT.   No findings suspicious for recurrent or metastatic disease.   Stable left upper abdominal soft tissue lesion and small lymph nodes, chronic, favoring treated lymphoma.      INTERVAL HISTORY:  Sheryl [name]  is here for a follow up of Gastric Cancer . She was last seen by me on 08/29/2022. She presents to the clinic accompanied by father.Pt state that she had no problems with the last cycle of chemo. Pt state that she vomit after getting home from treatment. Pt state that she feels ill three days after chemo.Pt denies having numbness and tingling in her hands. Pt state that she has some mouth discomfort but no soars.     All other  systems were reviewed with the patient and are negative.  MEDICAL HISTORY:  Past Medical History:  Diagnosis Date   Blood transfusion without reported diagnosis    had transfusion with hysterectomy   Cataract    Colon polyps 2012   Diabetes (HCC) 03/13/2021   Diabetes (HCC) 05/21/2019   Family history of breast cancer    Family history of pancreatic cancer    Family history of stomach cancer    Fibroid    GERD (gastroesophageal reflux disease)    H/O blood clots    History of hysterectomy    fibroids and heavy cycles   Hypertension     SURGICAL HISTORY: Past Surgical History:  Procedure Laterality Date   ABDOMINAL HYSTERECTOMY     BIOPSY  04/19/2022   Procedure: BIOPSY;  Surgeon: Lemar Lofty., MD;  Location: [facility];  Service: Gastroenterology;;   COLONOSCOPY     ESOPHAGOGASTRODUODENOSCOPY (EGD) WITH PROPOFOL N/A 04/19/2022   Procedure: ESOPHAGOGASTRODUODENOSCOPY (EGD) WITH PROPOFOL;  Surgeon: Lemar Lofty., MD;  Location: [facility];  Service: Gastroenterology;  Laterality: N/A;   EUS N/A 04/19/2022   Procedure: UPPER ENDOSCOPIC ULTRASOUND (EUS) RADIAL;  Surgeon: Lemar Lofty., MD;  Location: [facility];  Service: Gastroenterology;  Laterality: N/A;   EXCISION OF SKIN TAG  05/03/2022   Procedure: EXCISION OF CHEST WALL SKIN LESION;  Surgeon: Fritzi Mandes, MD;  Location: [facility] OR;  Service: General;;   LAPAROSCOPY N/A 05/03/2022   Procedure: LAPAROSCOPY DIAGNOSTIC WITH PERITONEAL WASHINGS;  Surgeon: Fritzi Mandes, MD;  Location: [facility] OR;  Service: General;  Laterality: N/A;   POLYPECTOMY  04/19/2022   Procedure: POLYPECTOMY;  Surgeon: Lemar Lofty., MD;  Location: [facility];  Service: Gastroenterology;;   PORTACATH PLACEMENT N/A 05/03/2022   Procedure: INSERTION PORT-A-CATH WITH ULTRASOUND GUIDANCE;  Surgeon: Fritzi Mandes, MD;  Location: [facility] OR;  Service: General;  Laterality: N/A;   UPPER GASTROINTESTINAL ENDOSCOPY      I  have reviewed the social history and family history with the patient and they are unchanged from previous note.  ALLERGIES:  is allergic to aspirin, cyclobenzaprine, naproxen sodium, zithromax [azithromycin dihydrate], oxaliplatin, and dilaudid [hydromorphone].  MEDICATIONS:  Current Outpatient Medications  Medication Sig Dispense Refill   acetaminophen (TYLENOL) 500 MG tablet Take 2 tablets (1,000 mg total) by mouth every 8 (eight) hours as needed (pain). 30 tablet 1   amLODipine (NORVASC) 10 MG tablet Take 1 tablet (10 mg total) by mouth daily. 90 tablet 1   Bacillus Coagulans-Inulin (PROBIOTIC-PREBIOTIC) 1-250 BILLION-MG CAPS Take 2 capsules by mouth daily. 30 capsule 1   Biotin 1000 MCG CHEW Chew 1,000 mcg by mouth daily. 30 tablet 2   Blood Glucose Monitoring Suppl (TRUE METRIX METER) w/Device KIT 1 kit by Does not apply route 3 (three) times daily as needed. 1 kit 0   Cholecalciferol (VITAMIN D3) 125 MCG (5000 UT) TABS Take 1 tablet (5,000 Units total) by mouth daily. 30 tablet 2   diphenhydrAMINE-prednisoLONE-nystatin in lidocaine solution Take 5  mls by mouth 3 (three) times daily as needed for mouth pain. 140 mL 0   ondansetron (ZOFRAN-ODT) 4 MG disintegrating tablet Take 1 tablet (4 mg total) by mouth every 6 (six) hours. Dissolve one tab on tongue 30 minutes before each dose of bowel prep (Patient not taking: Reported on 04/18/2022) 2 tablet 0   pantoprazole (PROTONIX) 40 MG tablet Take 1 tablet (40 mg total) by mouth 2 (two) times daily. 180 tablet 0   prochlorperazine (COMPAZINE) 10 MG tablet Take 1 tablet (10 mg total) by mouth every 6 (six) hours as needed for nausea or vomiting. 30 tablet 1   rosuvastatin (CRESTOR) 5 MG tablet Take 1 tablet (5 mg total) by mouth daily. (Patient not taking: Reported on 04/18/2022) 90 tablet 1   sucralfate (CARAFATE) 1 g tablet Take 1 tablet (1 g total) by mouth 2 (two) times daily. 60 tablet 6   Current Facility-Administered Medications  Medication  Dose Route Frequency Provider Last Rate Last Admin   0.9 %  sodium chloride infusion  500 mL Intravenous Continuous Tressia Danas, MD       Facility-Administered Medications Ordered in Other Visits  Medication Dose Route Frequency Provider Last Rate Last Admin   dexamethasone (DECADRON) 10 mg in sodium chloride 0.9 % 50 mL IVPB  10 mg Intravenous Once [name], MD       diphenhydrAMINE (BENADRYL) injection 25 mg  25 mg Intravenous Once [name], MD       famotidine (PEPCID) IVPB 20 mg in NS 100 mL IVPB  20 mg Intravenous Once [name], MD       fluorouracil (ADRUCIL) 5,000 mg in sodium chloride 0.9 % 150 mL chemo infusion  2,400 mg/m2 (Treatment Plan Recorded) Intravenous 1 day or 1 dose [name], MD       leucovorin 788 mg in dextrose 5 % 250 mL infusion  400 mg/m2 (Treatment Plan Recorded) Intravenous Once [name], MD       oxaliplatin (ELOXATIN) 150 mg in dextrose 5 % 500 mL chemo infusion  70 mg/m2 (Treatment Plan Recorded) Intravenous Once [name], MD       palonosetron (ALOXI) injection 0.25 mg  0.25 mg Intravenous Once [name], MD        PHYSICAL EXAMINATION: ECOG PERFORMANCE STATUS: 1 - Symptomatic but completely ambulatory  Vitals:   09/12/22 0858  BP: 128/89  Pulse: 69  Resp: 16  Temp: 98 F (36.7 C)  SpO2: 100%   Wt Readings from Last 3 Encounters:  09/12/22 190 lb 8 oz (86.4 kg)  08/29/22 193 lb 9.6 oz (87.8 kg)  08/15/22 190 lb 14.4 oz (86.6 kg)     GENERAL:alert, no distress and comfortable SKIN: skin color normal, no rashes or significant lesions EYES: normal, Conjunctiva are pink and non-injected, sclera clear  NEURO: alert & oriented x 3 with fluent speech LABORATORY DATA:  I have reviewed the data as listed    Latest Ref Rng & Units 09/12/2022    8:36 AM 08/27/2022    7:37 AM 08/15/2022    8:26 AM  CBC  WBC 4.0 - 10.5 K/uL 6.1  5.6  3.3   Hemoglobin 12.0 - 15.0 g/dL 13.0  86.5  78.4   Hematocrit 36.0 - 46.0 % 38.3  39.5  37.5   Platelets 150  - 400 K/uL 228  185  221         Latest Ref Rng & Units 09/12/2022    8:36 AM 08/27/2022  7:37 AM 08/15/2022    8:26 AM  CMP  Glucose 70 - 99 mg/dL 425  956  387   BUN 6 - 20 mg/dL 9  7  13    Creatinine 0.44 - 1.00 mg/dL 5.64  3.32  9.51   Sodium 135 - 145 mmol/L 140  143  140   Potassium 3.5 - 5.1 mmol/L 3.6  3.5  3.6   Chloride 98 - 111 mmol/L 105  106  105   CO2 22 - 32 mmol/L 29  29  28    Calcium 8.9 - 10.3 mg/dL 9.1  9.5  9.4   Total Protein 6.5 - 8.1 g/dL 7.2  7.2  7.2   Total Bilirubin 0.3 - 1.2 mg/dL 0.6  0.4  0.7   Alkaline Phos 38 - 126 U/L 135  136  109   AST 15 - 41 U/L 27  26  23    ALT 0 - 44 U/L 21  18  15        RADIOGRAPHIC STUDIES: I have personally reviewed the radiological images as listed and agreed with the findings in the report. No results found.    No orders of the defined types were placed in this encounter.  All questions were answered. The patient knows to call the clinic with any problems, questions or concerns. No barriers to learning was detected. The total time spent in the appointment was 25 minutes.     [name], MD 09/12/2022   Carolin Coy, CMA, am acting as scribe for [name], MD.   I have reviewed the above documentation for accuracy and completeness, and I agree with the above.

## 2022-09-12 ENCOUNTER — Inpatient Hospital Stay: Payer: Medicaid Other

## 2022-09-12 ENCOUNTER — Other Ambulatory Visit: Payer: Self-pay

## 2022-09-12 ENCOUNTER — Encounter: Payer: Self-pay | Admitting: Hematology

## 2022-09-12 ENCOUNTER — Inpatient Hospital Stay (HOSPITAL_BASED_OUTPATIENT_CLINIC_OR_DEPARTMENT_OTHER): Payer: Medicaid Other | Admitting: Hematology

## 2022-09-12 VITALS — BP 128/89 | HR 69 | Temp 98.0°F | Resp 16 | Ht 68.0 in | Wt 190.5 lb

## 2022-09-12 DIAGNOSIS — Z95828 Presence of other vascular implants and grafts: Secondary | ICD-10-CM

## 2022-09-12 DIAGNOSIS — C162 Malignant neoplasm of body of stomach: Secondary | ICD-10-CM

## 2022-09-12 DIAGNOSIS — Z5111 Encounter for antineoplastic chemotherapy: Secondary | ICD-10-CM | POA: Diagnosis not present

## 2022-09-12 DIAGNOSIS — D500 Iron deficiency anemia secondary to blood loss (chronic): Secondary | ICD-10-CM

## 2022-09-12 LAB — CMP (CANCER CENTER ONLY)
ALT: 21 U/L (ref 0–44)
AST: 27 U/L (ref 15–41)
Albumin: 4.3 g/dL (ref 3.5–5.0)
Alkaline Phosphatase: 135 U/L — ABNORMAL HIGH (ref 38–126)
Anion gap: 6 (ref 5–15)
BUN: 9 mg/dL (ref 6–20)
CO2: 29 mmol/L (ref 22–32)
Calcium: 9.1 mg/dL (ref 8.9–10.3)
Chloride: 105 mmol/L (ref 98–111)
Creatinine: 0.63 mg/dL (ref 0.44–1.00)
GFR, Estimated: >60 mL/min (ref >60)
Glucose, Bld: 106 mg/dL — ABNORMAL HIGH (ref 70–99)
Potassium: 3.6 mmol/L (ref 3.5–5.1)
Sodium: 140 mmol/L (ref 135–145)
Total Bilirubin: 0.6 mg/dL (ref 0.3–1.2)
Total Protein: 7.2 g/dL (ref 6.5–8.1)

## 2022-09-12 LAB — CBC WITH DIFFERENTIAL (CANCER CENTER ONLY)
Abs Immature Granulocytes: 0.03 10*3/uL (ref 0.00–0.07)
Basophils Absolute: 0.1 10*3/uL (ref 0.0–0.1)
Basophils Relative: 1 %
Eosinophils Absolute: 0.1 10*3/uL (ref 0.0–0.5)
Eosinophils Relative: 2 %
HCT: 38.3 % (ref 36.0–46.0)
Hemoglobin: 13.4 g/dL (ref 12.0–15.0)
Immature Granulocytes: 1 %
Lymphocytes Relative: 16 %
Lymphs Abs: 1 10*3/uL (ref 0.7–4.0)
MCH: 30.6 pg (ref 26.0–34.0)
MCHC: 35 g/dL (ref 30.0–36.0)
MCV: 87.4 fL (ref 80.0–100.0)
Monocytes Absolute: 0.8 10*3/uL (ref 0.1–1.0)
Monocytes Relative: 14 %
Neutro Abs: 4.1 10*3/uL (ref 1.7–7.7)
Neutrophils Relative %: 66 %
Platelet Count: 228 10*3/uL (ref 150–400)
RBC: 4.38 MIL/uL (ref 3.87–5.11)
RDW: 16 % — ABNORMAL HIGH (ref 11.5–15.5)
WBC Count: 6.1 10*3/uL (ref 4.0–10.5)
nRBC: 0 % (ref 0.0–0.2)

## 2022-09-12 LAB — FERRITIN: Ferritin: 133 ng/mL (ref 11–307)

## 2022-09-12 MED ORDER — SODIUM CHLORIDE 0.9 % IV SOLN
10.0000 mg | Freq: Once | INTRAVENOUS | Status: AC
  Administered 2022-09-12: 10 mg via INTRAVENOUS
  Filled 2022-09-12: qty 10

## 2022-09-12 MED ORDER — SODIUM CHLORIDE 0.9% FLUSH
10.0000 mL | Freq: Once | INTRAVENOUS | Status: AC
  Administered 2022-09-12: 10 mL

## 2022-09-12 MED ORDER — FAMOTIDINE 20 MG IN NS 100 ML IVPB
20.0000 mg | Freq: Once | INTRAVENOUS | Status: AC
  Administered 2022-09-12: 20 mg via INTRAVENOUS
  Filled 2022-09-12: qty 100

## 2022-09-12 MED ORDER — DIPHENHYDRAMINE HCL 50 MG/ML IJ SOLN
25.0000 mg | Freq: Once | INTRAMUSCULAR | Status: AC
  Administered 2022-09-12: 25 mg via INTRAVENOUS
  Filled 2022-09-12: qty 1

## 2022-09-12 MED ORDER — PALONOSETRON HCL INJECTION 0.25 MG/5ML
0.2500 mg | Freq: Once | INTRAVENOUS | Status: AC
  Administered 2022-09-12: 0.25 mg via INTRAVENOUS
  Filled 2022-09-12: qty 5

## 2022-09-12 MED ORDER — SODIUM CHLORIDE 0.9 % IV SOLN
2400.0000 mg/m2 | INTRAVENOUS | Status: DC
  Administered 2022-09-12: 5000 mg via INTRAVENOUS
  Filled 2022-09-12: qty 100

## 2022-09-12 MED ORDER — OXALIPLATIN CHEMO INJECTION 100 MG/20ML
70.0000 mg/m2 | Freq: Once | INTRAVENOUS | Status: AC
  Administered 2022-09-12: 150 mg via INTRAVENOUS
  Filled 2022-09-12: qty 10

## 2022-09-12 MED ORDER — DEXTROSE 5 % IV SOLN: Freq: Once | INTRAVENOUS | Status: AC

## 2022-09-12 MED ORDER — LEUCOVORIN CALCIUM INJECTION 350 MG
400.0000 mg/m2 | Freq: Once | INTRAVENOUS | Status: AC
  Administered 2022-09-12: 788 mg via INTRAVENOUS
  Filled 2022-09-12: qty 25

## 2022-09-12 MED ORDER — SODIUM CHLORIDE 0.9% FLUSH
10.0000 mL | Freq: Once | INTRAVENOUS | Status: AC
  Administered 2022-09-12: 10 mL via INTRAVENOUS

## 2022-09-12 NOTE — Patient Instructions (Addendum)
[place] [facility] [address]  Discharge Instructions: Thank you for choosing [facility] to provide your oncology and hematology care.   If you have a lab appointment with the Cancer Center, please go directly to the Cancer Center and check in at the registration area.   Wear comfortable clothing and clothing appropriate for easy access to any Portacath or PICC line.   We strive to give you quality time with your provider. You may need to reschedule your appointment if you arrive late (15 or more minutes).  Arriving late affects you and other patients whose appointments are after yours.  Also, if you miss three or more appointments without notifying the office, you may be dismissed from the clinic at the provider's discretion.      For prescription refill requests, have your pharmacy contact our office and allow 72 hours for refills to be completed.    Today you received the following chemotherapy and/or immunotherapy agents: Oxaliplatin and Fluorouracil       To help prevent nausea and vomiting after your treatment, we encourage you to take your nausea medication as directed.  BELOW ARE SYMPTOMS THAT SHOULD BE REPORTED IMMEDIATELY: *FEVER GREATER THAN 100.4 F (38 C) OR HIGHER *CHILLS OR SWEATING *NAUSEA AND VOMITING THAT IS NOT CONTROLLED WITH YOUR NAUSEA MEDICATION *UNUSUAL SHORTNESS OF BREATH *UNUSUAL BRUISING OR BLEEDING *URINARY PROBLEMS (pain or burning when urinating, or frequent urination) *BOWEL PROBLEMS (unusual diarrhea, constipation, pain near the anus) TENDERNESS IN MOUTH AND THROAT WITH OR WITHOUT PRESENCE OF ULCERS (sore throat, sores in mouth, or a toothache) UNUSUAL RASH, SWELLING OR PAIN  UNUSUAL VAGINAL DISCHARGE OR ITCHING   Items with * indicate a potential emergency and should be followed up as soon as possible or go to the Emergency Department if any problems should occur.  Please show the CHEMOTHERAPY ALERT CARD or IMMUNOTHERAPY  ALERT CARD at check-in to the Emergency Department and triage nurse.  Should you have questions after your visit or need to cancel or reschedule your appointment, please contact [place] [facility]  Dept: [phone]  and follow the prompts.  Office hours are 8:00 a.m. to 4:30 p.m. Monday - Friday. Please note that voicemails left after 4:00 p.m. may not be returned until the following business day.  We are closed weekends and major holidays. You have access to a nurse at all times for urgent questions. Please call the main number to the clinic Dept: [phone] and follow the prompts.   For any non-urgent questions, you may also contact your provider using MyChart. We now offer e-Visits for anyone 37 and older to request care online for non-urgent symptoms. For details visit [email or web address].   Also download the MyChart app! Go to the app store, search "MyChart", open the app, select [facility], and log in with your MyChart username and password.  The chemotherapy medication bag should finish at 46 hours, 96 hours, or 7 days. For example, if your pump is scheduled for 46 hours and it was put on at 4:00 p.m., it should finish at 2:00 p.m. the day it is scheduled to come off regardless of your appointment time.     Estimated time to finish at approximately 1:15 PM on 09/14/2022.   If the display on your pump reads "Low Volume" and it is beeping, take the batteries out of the pump and come to the cancer center for it to be taken off.   If the  pump alarms go off prior to the pump reading "Low Volume" then call [phone] and someone can assist you.  If the plunger comes out and the chemotherapy medication is leaking out, please use your home chemo spill kit to clean up the spill. Do NOT use paper towels or other household products.  If you have problems or questions regarding your pump, please call either [phone] (24 hours a day) or the cancer center  Monday-Friday 8:00 a.m.- 4:30 p.m. at the clinic number and we will assist you. If you are unable to get assistance, then go to the nearest Emergency Department and ask the staff to contact the IV team for assistance.

## 2022-09-14 ENCOUNTER — Inpatient Hospital Stay: Payer: Medicaid Other

## 2022-09-14 ENCOUNTER — Other Ambulatory Visit: Payer: Self-pay

## 2022-09-14 VITALS — BP 127/92 | HR 76 | Temp 98.9°F | Resp 16

## 2022-09-14 DIAGNOSIS — C162 Malignant neoplasm of body of stomach: Secondary | ICD-10-CM

## 2022-09-14 DIAGNOSIS — Z5111 Encounter for antineoplastic chemotherapy: Secondary | ICD-10-CM | POA: Diagnosis not present

## 2022-09-14 MED ORDER — PEGFILGRASTIM INJECTION 6 MG/0.6ML ~~LOC~~
6.0000 mg | PREFILLED_SYRINGE | Freq: Once | SUBCUTANEOUS | Status: AC
  Administered 2022-09-14: 6 mg via SUBCUTANEOUS
  Filled 2022-09-14: qty 0.6

## 2022-09-14 MED ORDER — SODIUM CHLORIDE 0.9% FLUSH
10.0000 mL | INTRAVENOUS | Status: DC | PRN
  Administered 2022-09-14: 10 mL

## 2022-09-14 MED ORDER — HEPARIN SOD (PORK) LOCK FLUSH 100 UNIT/ML IV SOLN
500.0000 [IU] | Freq: Once | INTRAVENOUS | Status: AC | PRN
  Administered 2022-09-14: 500 [IU]

## 2022-09-19 ENCOUNTER — Encounter: Payer: Self-pay | Admitting: Hematology

## 2022-09-25 MED FILL — Dexamethasone Sodium Phosphate Inj 100 MG/10ML: INTRAMUSCULAR | Qty: 1 | Status: AC

## 2022-09-25 NOTE — Assessment & Plan Note (Signed)
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   -she has started first line chemo FLOT on 1/10 -She understands that chemotherapy is palliative, to prolong her life.  We are unlikely going to cure her cancer. -PD-L1 0-1%, no significant benefit from PD-L1 immunotherapy, FO revealed FGFR2 amplification (+), no other targeted therapy available  -She has been tolerating chemo very well, will continue for now  -PET scan from 05/30/2022 was negative for primary tumor or metastatic disease, the known peritoneal mets did not show on PET.  -Foundation One showed no targetable mutations  -she has been tolerating chemo well overall. Due to fatigue, I have changed her chemo from FLOT to FOLFOX on 06/20/2022, she tolerated well -she previously asked the role of surgery, depends on her next restaging CT scan findings, I may refer her to [facility] or [facility] to discuss HIPEC surgery  -due to her infusion reaction to oxaliplatin on C5, we added additional premeds and gave slow infusion over 4 hours for cycle 6 and she tolerated well  -She is not able to return to work due to the cancer and treatment related symptoms.  -She is tolerating FOLFOX well overall, with moderate fatigue for a few days after infusion but able to recover well.  No signs of neuropathy at this point. -Restaging CT abdomen pelvis from August 27, 2022 showed no residual disease.  I personally reviewed her scan images and discussed findings with patient and her husband. -We discussed maintenance therapy with Xeloda down the road, we will stop oxaliplatin when she develops side effects especially neuropathy, or up to additional 3 months.  She agrees with the plan. -She has been having anorexia, nausea, intermittent vomiting for 3 days after chemo, I will reduce  oxaliplatin dose to 60 mg/m, she has no neuropathy so far, will continue FOLFOX for now.

## 2022-09-25 NOTE — Progress Notes (Unsigned)
[facility]   [phone] [phone]   Clinic Follow up Note   Patient Care Team: [name], MD as PCP - General (Internal Medicine) [name], MD as Consulting Physician (Oncology)  Date of Service:  09/26/2022  CHIEF COMPLAINT: f/u of Gastric Cancer   CURRENT THERAPY:  FOLFOX q14d X 12 cycles  ASSESSMENT:  Sheryl [name] is a 57 y.o. 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   -she has started first line chemo FLOT on 1/10 -She understands that chemotherapy is palliative, to prolong her life.  We are unlikely going to cure her cancer. -PD-L1 0-1%, no significant benefit from PD-L1 immunotherapy, FO revealed FGFR2 amplification (+), no other targeted therapy available  -She has been tolerating chemo very well, will continue for now  -PET scan from 05/30/2022 was negative for primary tumor or metastatic disease, the known peritoneal mets did not show on PET.  -Foundation One showed no targetable mutations  -she has been tolerating chemo well overall. Due to fatigue, I have changed her chemo from FLOT to FOLFOX on 06/20/2022, she tolerated well -she previously asked the role of surgery, depends on her next restaging CT scan findings, I may refer her to [facility] or [facility] to discuss HIPEC surgery  -due to her infusion reaction to oxaliplatin on C5, we added additional premeds and gave slow infusion over 4 hours for cycle 6 and she tolerated well  -She is not able to return to work due to the cancer and treatment related symptoms.  -She is tolerating FOLFOX well overall, with moderate fatigue for a few days after infusion but able to recover well.  No signs of neuropathy at this point. -Restaging CT abdomen pelvis from  August 27, 2022 showed no residual disease.  I personally reviewed her scan images and discussed findings with patient and her husband. -We discussed maintenance therapy with Xeloda down the road, we will stop oxaliplatin when she develops side effects especially neuropathy, or up to additional 3 months.  She agrees with the plan. -She has been having anorexia, nausea, intermittent vomiting for 3 days after chemo, I will reduce oxaliplatin dose to 70 mg/m, she has minimal neuropathy so far, will continue FOLFOX for now.     PLAN: -lab reviewed -proceed with C8 Folfox at same reduced dose  -[place] test -mild decrease in sensation -lab/flush and FOLFOX 6/19, postpone next cycle for a week due to her family trip    SUMMARY OF ONCOLOGIC HISTORY: Oncology History Overview Note   Cancer Staging  Gastric cancer [facility]) Staging form: Stomach, AJCC 8th Edition - Clinical stage from 04/19/2022: Stage IVB (cT2, cN0, pM1) - Signed by [name], MD on 05/08/2022 Total positive nodes: 0     Gastric cancer (HCC)  03/30/2022 Procedure   EGD:  Impression:  - Normal esophagus. - A few gastric polyps. Biopsied. - Gastritis. Biopsied. - Non-bleeding gastric ulcer with no stigmata of bleeding. Biopsied. - Normal examined duodenum. Biopsied.  Findings: Diffuse moderate inflammation characterized by congestion (edema), friability and granularity was found in the cardia, in the gastric fundus and in the gastric body. There were associated erosions in multiple places. Biopsies were taken from the antrum, body, and fundus with a cold forceps for histology. Estimated blood loss was minimal.  One  non-bleeding cratered gastric ulcer with no stigmata of bleeding was found on the greater curvature of the stomach. The lesion was 6 mm in largest dimension. The mucosa around the ulcer was heaped and led to some deformity in the antrum. Biopsies were taken with a cold forceps for histology. Estimated blood  loss was minimal.    03/30/2022 Pathology Results   Patient: Sheryl, [name]  Accession: [id number]  Diagnosis 1. Surgical [P], duodenal - BENIGN SMALL BOWEL MUCOSA WITH NO SIGNIFICANT PATHOLOGIC CHANGES 2. Surgical [P], gastric antrum - GASTRIC ANTRAL MUCOSA WITH FEATURES OF REACTIVE GASTROPATHY - NEGATIVE FOR H. PYLORI ON H&E STAIN - NEGATIVE FOR INTESTINAL METAPLASIA OR MALIGNANCY 3. Surgical [P], gastric body - GASTRIC OXYNTIC MUCOSA WITH REACTIVE/REPARATIVE CHANGES - NEGATIVE FOR H. PYLORI ON H&E STAIN - NEGATIVE FOR INTESTINAL METAPLASIA, DYSPLASIA OR MALIGNANCY 4. Surgical [P], greater curve ulceration - ADENOCARCINOMA WITH SIGNET RING CELL FEATURES (SEE NOTE) 5. Surgical [P], gastric polyps - ADENOCARCINOMA WITH SIGNET RING CELL FEATURES (SEE NOTE) 6. Surgical [P], fundus (gastric) - ADENOCARCINOMA WITH SIGNET RING CELL FEATURES (SEE NOTE) 7. Surgical [P], colon, ascending, polyp (1) - TUBULAR ADENOMA. - NO HIGH GRADE DYSPLASIA OR MALIGNANCY. 8. Surgical [P], colon, transverse, polyp (1) - TUBULAR ADENOMA. - NO HIGH GRADE DYSPLASIA OR MALIGNANCY.    04/13/2022 Initial Diagnosis   Gastric cancer (HCC)   04/19/2022 Cancer Staging   Staging form: Stomach, AJCC 8th Edition - Clinical stage from 04/19/2022: Stage IVB (cT2, cN0, pM1) - Signed by [name], MD on 05/08/2022 Total positive nodes: 0   05/05/2022 Genetic Testing   Negative genetic testing on the Multi-cancer gene panel + RNA.  FH c.259C>T VUS identified.  The report date is May 05, 2022.  The Multi-Cancer + RNA Panel offered by [facility] includes sequencing and/or deletion/duplication analysis of the following 70 genes:  AIP*, ALK, APC*, ATM*, AXIN2*, BAP1*, BARD1*, BLM*, BMPR1A*, BRCA1*, BRCA2*, BRIP1*, CDC73*, CDH1*, CDK4, CDKN1B*, CDKN2A, CHEK2*, CTNNA1*, DICER1*, EPCAM (del/dup only), EGFR, FH*, FLCN*, GREM1 (promoter dup only), HOXB13, KIT, LZTR1, MAX*, MBD4, MEN1*, MET, MITF, MLH1*, MSH2*, MSH3*, MSH6*,  MUTYH*, NF1*, NF2*, NTHL1*, PALB2*, PDGFRA, PMS2*, POLD1*, POLE*, POT1*, PRKAR1A*, PTCH1*, PTEN*, RAD51C*, RAD51D*, RB1*, RET, SDHA* (sequencing only), SDHAF2*, SDHB*, SDHC*, SDHD*, SMAD4*, SMARCA4*, SMARCB1*, SMARCE1*, STK11*, SUFU*, TMEM127*, TP53*, TSC1*, TSC2*, VHL*. RNA analysis is performed for * genes.    05/09/2022 - 06/07/2022 Chemotherapy   Patient is on Treatment Plan : GASTROESOPHAGEAL FLOT q14d X 4 cycles      Miscellaneous   Foundation One  Biomarker Findings Microsatellite status- Cannot be determined Tumor Mutational Burden- Cannot be determined  Genomic Findings  FGFR2 amplification,FGFR2-TACC2 fusion,  Rearrangement intron 17 ARAF amplification CCND3 amplification TP53 [id number]*74     05/30/2022 Imaging    IMPRESSION: 1. Mild hypermetabolism corresponding to a dominant left upper quadrant mass and smaller perigastric nodules or nodes. 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 Recommend attention to the dominant left upper quadrant soft tissue mass on follow-up exams to exclude unlikely recurrent lymphoma. 2. No gastric hypermetabolism and no typical findings of metastatic disease.   06/20/2022 -  Chemotherapy   Patient is on Treatment Plan : GASTRIC FOLFOX q14d x 12 cycles     08/27/2022 Imaging    IMPRESSION: No focal gastric mass on CT.   No findings suspicious for recurrent or metastatic disease.   Stable left upper abdominal soft tissue lesion and small lymph nodes, chronic, favoring treated lymphoma.  INTERVAL HISTORY:  Sheryl [name] is here for a follow up of Gastric Cancer. She was last seen by me on 09/12/2022. She presents to the clinic accompanied by father. Last cycle went well, but has some fatigue. Pt report of having some numbness in finger tips. Pt denies having trouble holding objects. Pt denies any other issues,    All other systems were reviewed with the patient and are negative.  MEDICAL  HISTORY:  Past Medical History:  Diagnosis Date   Blood transfusion without reported diagnosis    had transfusion with hysterectomy   Cataract    Colon polyps 2012   Diabetes (HCC) 03/13/2021   Diabetes (HCC) 05/21/2019   Family history of breast cancer    Family history of pancreatic cancer    Family history of stomach cancer    Fibroid    GERD (gastroesophageal reflux disease)    H/O blood clots    History of hysterectomy    fibroids and heavy cycles   Hypertension     SURGICAL HISTORY: Past Surgical History:  Procedure Laterality Date   ABDOMINAL HYSTERECTOMY     BIOPSY  04/19/2022   Procedure: BIOPSY;  Surgeon: Lemar Lofty., MD;  Location: [facility];  Service: Gastroenterology;;   COLONOSCOPY     ESOPHAGOGASTRODUODENOSCOPY (EGD) WITH PROPOFOL N/A 04/19/2022   Procedure: ESOPHAGOGASTRODUODENOSCOPY (EGD) WITH PROPOFOL;  Surgeon: Lemar Lofty., MD;  Location: [facility];  Service: Gastroenterology;  Laterality: N/A;   EUS N/A 04/19/2022   Procedure: UPPER ENDOSCOPIC ULTRASOUND (EUS) RADIAL;  Surgeon: Lemar Lofty., MD;  Location: [facility];  Service: Gastroenterology;  Laterality: N/A;   EXCISION OF SKIN TAG  05/03/2022   Procedure: EXCISION OF CHEST WALL SKIN LESION;  Surgeon: Fritzi Mandes, MD;  Location: [facility] OR;  Service: General;;   LAPAROSCOPY N/A 05/03/2022   Procedure: LAPAROSCOPY DIAGNOSTIC WITH PERITONEAL WASHINGS;  Surgeon: Fritzi Mandes, MD;  Location: [facility] OR;  Service: General;  Laterality: N/A;   POLYPECTOMY  04/19/2022   Procedure: POLYPECTOMY;  Surgeon: Lemar Lofty., MD;  Location: [facility];  Service: Gastroenterology;;   PORTACATH PLACEMENT N/A 05/03/2022   Procedure: INSERTION PORT-A-CATH WITH ULTRASOUND GUIDANCE;  Surgeon: Fritzi Mandes, MD;  Location: [facility] OR;  Service: General;  Laterality: N/A;   UPPER GASTROINTESTINAL ENDOSCOPY      I have reviewed the social history and family history with the patient  and they are unchanged from previous note.  ALLERGIES:  is allergic to aspirin, cyclobenzaprine, naproxen sodium, zithromax [azithromycin dihydrate], oxaliplatin, and dilaudid [hydromorphone].  MEDICATIONS:  Current Outpatient Medications  Medication Sig Dispense Refill   potassium chloride SA (KLOR-CON M) 20 MEQ tablet Take 1 tablet (20 mEq total) by mouth 2 (two) times daily. Change to once daily after 5 days, until finish 30 tablet 0   acetaminophen (TYLENOL) 500 MG tablet Take 2 tablets (1,000 mg total) by mouth every 8 (eight) hours as needed (pain). 30 tablet 1   amLODipine (NORVASC) 10 MG tablet Take 1 tablet (10 mg total) by mouth daily. 90 tablet 1   Bacillus Coagulans-Inulin (PROBIOTIC-PREBIOTIC) 1-250 BILLION-MG CAPS Take 2 capsules by mouth daily. 30 capsule 1   Biotin 1000 MCG CHEW Chew 1,000 mcg by mouth daily. 30 tablet 2   Blood Glucose Monitoring Suppl (TRUE METRIX METER) w/Device KIT 1 kit by Does not apply route 3 (three) times daily as needed. 1 kit 0   Cholecalciferol (VITAMIN D3) 125 MCG (5000 UT) TABS Take 1 tablet (5,000 Units  total) by mouth daily. 30 tablet 2   diphenhydrAMINE-prednisoLONE-nystatin in lidocaine solution Take 5 mls by mouth 3 (three) times daily as needed for mouth pain. 140 mL 0   ondansetron (ZOFRAN-ODT) 4 MG disintegrating tablet Take 1 tablet (4 mg total) by mouth every 6 (six) hours. Dissolve one tab on tongue 30 minutes before each dose of bowel prep (Patient not taking: Reported on 04/18/2022) 2 tablet 0   pantoprazole (PROTONIX) 40 MG tablet Take 1 tablet (40 mg total) by mouth 2 (two) times daily. 180 tablet 0   prochlorperazine (COMPAZINE) 10 MG tablet Take 1 tablet (10 mg total) by mouth every 6 (six) hours as needed for nausea or vomiting. 30 tablet 1   rosuvastatin (CRESTOR) 5 MG tablet Take 1 tablet (5 mg total) by mouth daily. (Patient not taking: Reported on 04/18/2022) 90 tablet 1   sucralfate (CARAFATE) 1 g tablet Take 1 tablet (1 g  total) by mouth 2 (two) times daily. 60 tablet 6   Current Facility-Administered Medications  Medication Dose Route Frequency Provider Last Rate Last Admin   0.9 %  sodium chloride infusion  500 mL Intravenous Continuous Tressia Danas, MD       Facility-Administered Medications Ordered in Other Visits  Medication Dose Route Frequency Provider Last Rate Last Admin   fluorouracil (ADRUCIL) 5,000 mg in sodium chloride 0.9 % 150 mL chemo infusion  2,400 mg/m2 (Treatment Plan Recorded) Intravenous 1 day or 1 dose [name], MD   Infusion Verify at 09/26/22 1532   sodium chloride flush (NS) 0.9 % injection 10 mL  10 mL Intracatheter PRN [name], MD        PHYSICAL EXAMINATION: ECOG PERFORMANCE STATUS: 1 - Symptomatic but completely ambulatory  Vitals:   09/26/22 0847  BP: 124/89  Pulse: 77  Resp: 15  Temp: 98.3 F (36.8 C)  SpO2: 100%   Wt Readings from Last 3 Encounters:  09/26/22 190 lb 6.4 oz (86.4 kg)  09/12/22 190 lb 8 oz (86.4 kg)  08/29/22 193 lb 9.6 oz (87.8 kg)     GENERAL:alert, no distress and comfortable SKIN: skin color normal, no rashes or significant lesions EYES: normal, Conjunctiva are pink and non-injected, sclera clear  NEURO: alert & oriented x 3 with fluent speech   LABORATORY DATA:  I have reviewed the data as listed    Latest Ref Rng & Units 09/26/2022    8:28 AM 09/12/2022    8:36 AM 08/27/2022    7:37 AM  CBC  WBC 4.0 - 10.5 K/uL 5.7  6.1  5.6   Hemoglobin 12.0 - 15.0 g/dL 16.1  09.6  04.5   Hematocrit 36.0 - 46.0 % 39.7  38.3  39.5   Platelets 150 - 400 K/uL 233  228  185         Latest Ref Rng & Units 09/26/2022    8:28 AM 09/12/2022    8:36 AM 08/27/2022    7:37 AM  CMP  Glucose 70 - 99 mg/dL 409  811  914   BUN 6 - 20 mg/dL 10  9  7    Creatinine 0.44 - 1.00 mg/dL 7.82  9.56  2.13   Sodium 135 - 145 mmol/L 139  140  143   Potassium 3.5 - 5.1 mmol/L 3.0  3.6  3.5   Chloride 98 - 111 mmol/L 103  105  106   CO2 22 - 32 mmol/L 29  29   29    Calcium 8.9 - 10.3  mg/dL 9.2  9.1  9.5   Total Protein 6.5 - 8.1 g/dL 7.5  7.2  7.2   Total Bilirubin 0.3 - 1.2 mg/dL 0.6  0.6  0.4   Alkaline Phos 38 - 126 U/L 151  135  136   AST 15 - 41 U/L 27  27  26    ALT 0 - 44 U/L 19  21  18        RADIOGRAPHIC STUDIES: I have personally reviewed the radiological images as listed and agreed with the findings in the report. No results found.    Orders Placed This Encounter  Procedures   CBC with Differential (Cancer Center Only)    Standing Status:   Future    Standing Expiration Date:   11/14/2023   CMP (Cancer Center only)    Standing Status:   Future    Standing Expiration Date:   11/14/2023   CBC with Differential (Cancer Center Only)    Standing Status:   Future    Standing Expiration Date:   11/28/2023   CMP (Cancer Center only)    Standing Status:   Future    Standing Expiration Date:   11/28/2023   All questions were answered. The patient knows to call the clinic with any problems, questions or concerns. No barriers to learning was detected. The total time spent in the appointment was 25 minutes.     [name], MD 09/26/2022   Carolin Coy, CMA, am acting as scribe for [name], MD.   I have reviewed the above documentation for accuracy and completeness, and I agree with the above.

## 2022-09-26 ENCOUNTER — Inpatient Hospital Stay: Payer: Medicaid Other

## 2022-09-26 ENCOUNTER — Inpatient Hospital Stay (HOSPITAL_BASED_OUTPATIENT_CLINIC_OR_DEPARTMENT_OTHER): Payer: Medicaid Other | Admitting: Hematology

## 2022-09-26 ENCOUNTER — Inpatient Hospital Stay: Payer: Medicaid Other | Admitting: Dietician

## 2022-09-26 ENCOUNTER — Other Ambulatory Visit: Payer: Self-pay

## 2022-09-26 ENCOUNTER — Encounter: Payer: Self-pay | Admitting: Hematology

## 2022-09-26 VITALS — BP 124/89 | HR 77 | Temp 98.3°F | Resp 15 | Ht 68.0 in | Wt 190.4 lb

## 2022-09-26 DIAGNOSIS — Z95828 Presence of other vascular implants and grafts: Secondary | ICD-10-CM

## 2022-09-26 DIAGNOSIS — C162 Malignant neoplasm of body of stomach: Secondary | ICD-10-CM

## 2022-09-26 DIAGNOSIS — Z5111 Encounter for antineoplastic chemotherapy: Secondary | ICD-10-CM | POA: Diagnosis not present

## 2022-09-26 LAB — CMP (CANCER CENTER ONLY)
ALT: 19 U/L (ref 0–44)
AST: 27 U/L (ref 15–41)
Albumin: 4.4 g/dL (ref 3.5–5.0)
Alkaline Phosphatase: 151 U/L — ABNORMAL HIGH (ref 38–126)
Anion gap: 7 (ref 5–15)
BUN: 10 mg/dL (ref 6–20)
CO2: 29 mmol/L (ref 22–32)
Calcium: 9.2 mg/dL (ref 8.9–10.3)
Chloride: 103 mmol/L (ref 98–111)
Creatinine: 0.71 mg/dL (ref 0.44–1.00)
GFR, Estimated: >60 mL/min (ref >60)
Glucose, Bld: 145 mg/dL — ABNORMAL HIGH (ref 70–99)
Potassium: 3 mmol/L — ABNORMAL LOW (ref 3.5–5.1)
Sodium: 139 mmol/L (ref 135–145)
Total Bilirubin: 0.6 mg/dL (ref 0.3–1.2)
Total Protein: 7.5 g/dL (ref 6.5–8.1)

## 2022-09-26 LAB — CBC WITH DIFFERENTIAL (CANCER CENTER ONLY)
Abs Immature Granulocytes: 0.03 10*3/uL (ref 0.00–0.07)
Basophils Absolute: 0.1 10*3/uL (ref 0.0–0.1)
Basophils Relative: 1 %
Eosinophils Absolute: 0.3 10*3/uL (ref 0.0–0.5)
Eosinophils Relative: 5 %
HCT: 39.7 % (ref 36.0–46.0)
Hemoglobin: 13.8 g/dL (ref 12.0–15.0)
Immature Granulocytes: 1 %
Lymphocytes Relative: 14 %
Lymphs Abs: 0.8 10*3/uL (ref 0.7–4.0)
MCH: 30.6 pg (ref 26.0–34.0)
MCHC: 34.8 g/dL (ref 30.0–36.0)
MCV: 88 fL (ref 80.0–100.0)
Monocytes Absolute: 0.6 10*3/uL (ref 0.1–1.0)
Monocytes Relative: 11 %
Neutro Abs: 3.9 10*3/uL (ref 1.7–7.7)
Neutrophils Relative %: 68 %
Platelet Count: 233 10*3/uL (ref 150–400)
RBC: 4.51 MIL/uL (ref 3.87–5.11)
RDW: 16.1 % — ABNORMAL HIGH (ref 11.5–15.5)
WBC Count: 5.7 10*3/uL (ref 4.0–10.5)
nRBC: 0 % (ref 0.0–0.2)

## 2022-09-26 MED ORDER — SODIUM CHLORIDE 0.9 % IV SOLN
10.0000 mg | Freq: Once | INTRAVENOUS | Status: AC
  Administered 2022-09-26: 10 mg via INTRAVENOUS
  Filled 2022-09-26: qty 10

## 2022-09-26 MED ORDER — POTASSIUM CHLORIDE CRYS ER 20 MEQ PO TBCR
20.0000 meq | EXTENDED_RELEASE_TABLET | Freq: Two times a day (BID) | ORAL | 0 refills | Status: DC
  Filled 2022-09-26: qty 30, 10d supply, fill #0

## 2022-09-26 MED ORDER — LEUCOVORIN CALCIUM INJECTION 350 MG
400.0000 mg/m2 | Freq: Once | INTRAVENOUS | Status: AC
  Administered 2022-09-26: 788 mg via INTRAVENOUS
  Filled 2022-09-26: qty 25

## 2022-09-26 MED ORDER — SODIUM CHLORIDE 0.9% FLUSH
10.0000 mL | Freq: Once | INTRAVENOUS | Status: AC
  Administered 2022-09-26: 10 mL

## 2022-09-26 MED ORDER — PALONOSETRON HCL INJECTION 0.25 MG/5ML
0.2500 mg | Freq: Once | INTRAVENOUS | Status: AC
  Administered 2022-09-26: 0.25 mg via INTRAVENOUS
  Filled 2022-09-26: qty 5

## 2022-09-26 MED ORDER — FAMOTIDINE IN NACL 20-0.9 MG/50ML-% IV SOLN
20.0000 mg | Freq: Once | INTRAVENOUS | Status: AC
  Administered 2022-09-26: 20 mg via INTRAVENOUS
  Filled 2022-09-26: qty 50

## 2022-09-26 MED ORDER — DIPHENHYDRAMINE HCL 50 MG/ML IJ SOLN
25.0000 mg | Freq: Once | INTRAMUSCULAR | Status: AC
  Administered 2022-09-26: 25 mg via INTRAVENOUS
  Filled 2022-09-26: qty 1

## 2022-09-26 MED ORDER — SODIUM CHLORIDE 0.9 % IV SOLN
2400.0000 mg/m2 | INTRAVENOUS | Status: DC
  Administered 2022-09-26: 5000 mg via INTRAVENOUS
  Filled 2022-09-26: qty 100

## 2022-09-26 MED ORDER — SODIUM CHLORIDE 0.9% FLUSH: 10.0000 mL | INTRAVENOUS | Status: DC | PRN

## 2022-09-26 MED ORDER — OXALIPLATIN CHEMO INJECTION 100 MG/20ML
70.0000 mg/m2 | Freq: Once | INTRAVENOUS | Status: AC
  Administered 2022-09-26: 150 mg via INTRAVENOUS
  Filled 2022-09-26: qty 10

## 2022-09-26 MED ORDER — DEXTROSE 5 % IV SOLN: Freq: Once | INTRAVENOUS | Status: AC

## 2022-09-26 NOTE — Patient Instructions (Addendum)
Hypokalemia Hypokalemia means that the amount of potassium in the blood is lower than normal. Potassium is a mineral (electrolyte) that helps regulate the amount of fluid in the body. It also stimulates muscle tightening (contraction) and helps nerves work properly. Normally, most of the body's potassium is inside cells, and only a very small amount is in the blood. Because the amount in the blood is so small, minor changes to potassium levels in the blood can be life-threatening. What are the causes? This condition may be caused by: Antibiotic medicine. Diarrhea or vomiting. Taking too much of a medicine that helps you have a bowel movement (laxative) can cause diarrhea and lead to hypokalemia. Chronic kidney disease (CKD). Medicines that help the body get rid of excess fluid (diuretics). Eating disorders, such as anorexia or bulimia. Low magnesium levels in the body. Sweating a lot. What are the signs or symptoms? Symptoms of this condition include: Weakness. Constipation. Fatigue. Muscle cramps. Mental confusion. Skipped heartbeats or irregular heartbeat (palpitations). Tingling or numbness. How is this diagnosed? This condition is diagnosed with a blood test. How is this treated? This condition may be treated by: Taking potassium supplements. Adjusting the medicines that you take. Eating more foods that contain a lot of potassium. If your potassium level is very low, you may need to get potassium through an IV and be monitored in the hospital. Follow these instructions at home: Eating and drinking  Eat a healthy diet. A healthy diet includes fresh fruits and vegetables, whole grains, healthy fats, and lean proteins. If told, eat more foods that contain a lot of potassium. These include: Nuts, such as peanuts and pistachios. Seeds, such as sunflower seeds and pumpkin seeds. Peas, lentils, and lima beans. Whole grain and bran cereals and breads. Fresh fruits and  vegetables, such as apricots, avocado, bananas, cantaloupe, kiwi, oranges, tomatoes, asparagus, and potatoes. Juices, such as orange, tomato, and prune. Lean meats, including fish. Milk and milk products, such as yogurt. General instructions Take over-the-counter and prescription medicines only as told by your health care provider. This includes vitamins, natural food products, and supplements. Keep all follow-up visits. This is important. Contact a health care provider if: You have weakness that gets worse. You feel your heart pounding or racing. You vomit. You have diarrhea. You have diabetes and you have trouble keeping your blood sugar in your target range. Get help right away if: You have chest pain. You have shortness of breath. You have vomiting or diarrhea that lasts for more than 2 days. You faint. These symptoms may be an emergency. Get help right away. Call 911. Do not wait to see if the symptoms will go away. Do not drive yourself to the hospital. Summary Hypokalemia means that the amount of potassium in the blood is lower than normal. This condition is diagnosed with a blood test. Hypokalemia may be treated by taking potassium supplements, adjusting the medicines that you take, or eating more foods that are high in potassium. If your potassium level is very low, you may need to get potassium through an IV and be monitored in the hospital. This information is not intended to replace advice given to you by your health care provider. Make sure you discuss any questions you have with your health care provider. Document Revised: 12/29/2020 Document Reviewed: 12/29/2020 Elsevier Patient Education  2024 [employer] [facility]  Discharge Instructions: Thank you for choosing [facility] to provide your oncology and  hematology care.   If you have a lab appointment with the Cancer Center, please go directly to the Cancer Center  and check in at the registration area.   Wear comfortable clothing and clothing appropriate for easy access to any Portacath or PICC line.   We strive to give you quality time with your provider. You may need to reschedule your appointment if you arrive late (15 or more minutes).  Arriving late affects you and other patients whose appointments are after yours.  Also, if you miss three or more appointments without notifying the office, you may be dismissed from the clinic at the provider's discretion.      For prescription refill requests, have your pharmacy contact our office and allow 72 hours for refills to be completed.    Today you received the following chemotherapy and/or immunotherapy agents: Oxaliplatin and Fluorouracil       To help prevent nausea and vomiting after your treatment, we encourage you to take your nausea medication as directed.  BELOW ARE SYMPTOMS THAT SHOULD BE REPORTED IMMEDIATELY: *FEVER GREATER THAN 100.4 F (38 C) OR HIGHER *CHILLS OR SWEATING *NAUSEA AND VOMITING THAT IS NOT CONTROLLED WITH YOUR NAUSEA MEDICATION *UNUSUAL SHORTNESS OF BREATH *UNUSUAL BRUISING OR BLEEDING *URINARY PROBLEMS (pain or burning when urinating, or frequent urination) *BOWEL PROBLEMS (unusual diarrhea, constipation, pain near the anus) TENDERNESS IN MOUTH AND THROAT WITH OR WITHOUT PRESENCE OF ULCERS (sore throat, sores in mouth, or a toothache) UNUSUAL RASH, SWELLING OR PAIN  UNUSUAL VAGINAL DISCHARGE OR ITCHING   Items with * indicate a potential emergency and should be followed up as soon as possible or go to the Emergency Department if any problems should occur.  Please show the CHEMOTHERAPY ALERT CARD or IMMUNOTHERAPY ALERT CARD at check-in to the Emergency Department and triage nurse.  Should you have questions after your visit or need to cancel or reschedule your appointment, please contact [facility]  Dept: [phone]  and follow the  prompts.  Office hours are 8:00 a.m. to 4:30 p.m. Monday - Friday. Please note that voicemails left after 4:00 p.m. may not be returned until the following business day.  We are closed weekends and major holidays. You have access to a nurse at all times for urgent questions. Please call the main number to the clinic Dept: [phone] and follow the prompts.   For any non-urgent questions, you may also contact your provider using MyChart. We now offer e-Visits for anyone 36 and older to request care online for non-urgent symptoms. For details visit [email or web address].   Also download the MyChart app! Go to the app store, search "MyChart", open the app, select [place], and log in with your MyChart username and password.  The chemotherapy medication bag should finish at 46 hours, 96 hours, or 7 days. For example, if your pump is scheduled for 46 hours and it was put on at 4:00 p.m., it should finish at 2:00 p.m. the day it is scheduled to come off regardless of your appointment time.     Estimated time to finish at approximately 1:15 PM on 09/14/2022.   If the display on your pump reads "Low Volume" and it is beeping, take the batteries out of the pump and come to the cancer center for it to be taken off.   If the pump alarms go off prior to the pump reading "Low Volume" then call [phone] and someone can assist you.  If the plunger  comes out and the chemotherapy medication is leaking out, please use your home chemo spill kit to clean up the spill. Do NOT use paper towels or other household products.  If you have problems or questions regarding your pump, please call either [phone] (24 hours a day) or the cancer center Monday-Friday 8:00 a.m.- 4:30 p.m. at the clinic number and we will assist you. If you are unable to get assistance, then go to the nearest Emergency Department and ask the staff to contact the IV team for assistance.

## 2022-09-26 NOTE — Progress Notes (Signed)
Maintain dose of oxaliplatin at 70 mg/m2 .  T.O. Dr Audie Clear, PharmD

## 2022-09-26 NOTE — Progress Notes (Signed)
Nutrition Follow-up:  Patient with gastric cancer. She is receiving neoadjuvant FOLFOX q14d   Met with patient in infusion. She reports doing well overall. Her appetite has been good. She denies nausea, vomiting, diarrhea, constipation. Patient asking what foods to eat to help with low potassium.     Medications: klor-con 20 mEq/day (5/29) Labs: K 3.0, glucose 145  Anthropometrics: Wt 190 lb 6.4 oz today stable   4/17 - 190 14.4 oz 3/20 - 187 lb 1.6 oz    NUTRITION DIAGNOSIS: Unintended weight loss improved     INTERVENTION:  Continue strategies for increasing calories and protein with small frequent meals and snacks Educated on good sources of potassium  Noted oral potassium 20 mEq/day per MD    MONITORING, EVALUATION, GOAL: weight trends, intake. labs   NEXT VISIT: Wednesday July 3 in infusion

## 2022-09-27 ENCOUNTER — Other Ambulatory Visit: Payer: Self-pay | Admitting: Nurse Practitioner

## 2022-09-27 ENCOUNTER — Other Ambulatory Visit: Payer: Self-pay | Admitting: Gastroenterology

## 2022-09-27 DIAGNOSIS — K219 Gastro-esophageal reflux disease without esophagitis: Secondary | ICD-10-CM

## 2022-09-27 DIAGNOSIS — C162 Malignant neoplasm of body of stomach: Secondary | ICD-10-CM

## 2022-09-28 ENCOUNTER — Other Ambulatory Visit: Payer: Self-pay

## 2022-09-28 ENCOUNTER — Inpatient Hospital Stay: Payer: Medicaid Other

## 2022-09-28 DIAGNOSIS — Z5111 Encounter for antineoplastic chemotherapy: Secondary | ICD-10-CM | POA: Diagnosis not present

## 2022-09-28 DIAGNOSIS — C162 Malignant neoplasm of body of stomach: Secondary | ICD-10-CM

## 2022-09-28 MED ORDER — PANTOPRAZOLE SODIUM 40 MG PO TBEC
40.0000 mg | DELAYED_RELEASE_TABLET | Freq: Two times a day (BID) | ORAL | 0 refills | Status: AC
Start: 2022-09-28 — End: ?
  Filled 2022-09-28: qty 180, 90d supply, fill #0

## 2022-09-28 MED ORDER — ONDANSETRON 4 MG PO TBDP
8.0000 mg | ORAL_TABLET | Freq: Three times a day (TID) | ORAL | 2 refills | Status: DC | PRN
  Filled 2022-09-28: qty 20, 4d supply, fill #0
  Filled 2022-10-16: qty 20, 4d supply, fill #1
  Filled 2022-10-28: qty 20, 4d supply, fill #2

## 2022-09-28 MED ORDER — PROCHLORPERAZINE MALEATE 10 MG PO TABS
10.0000 mg | ORAL_TABLET | Freq: Four times a day (QID) | ORAL | 1 refills | Status: DC | PRN
Start: 2022-09-28 — End: 2022-11-15
  Filled 2022-10-16: qty 30, 8d supply, fill #0
  Filled 2022-10-28: qty 30, 8d supply, fill #1

## 2022-09-28 MED ORDER — SODIUM CHLORIDE 0.9% FLUSH
10.0000 mL | INTRAVENOUS | Status: DC | PRN
  Administered 2022-09-28: 10 mL

## 2022-09-28 MED ORDER — HEPARIN SOD (PORK) LOCK FLUSH 100 UNIT/ML IV SOLN
500.0000 [IU] | Freq: Once | INTRAVENOUS | Status: AC | PRN
  Administered 2022-09-28: 500 [IU]

## 2022-09-28 MED ORDER — PROCHLORPERAZINE MALEATE 10 MG PO TABS
10.0000 mg | ORAL_TABLET | Freq: Four times a day (QID) | ORAL | 1 refills | Status: DC | PRN
Start: 2022-09-28 — End: 2022-09-28
  Filled 2022-09-28: qty 30, 8d supply, fill #0

## 2022-09-28 MED ORDER — PEGFILGRASTIM INJECTION 6 MG/0.6ML ~~LOC~~
6.0000 mg | PREFILLED_SYRINGE | Freq: Once | SUBCUTANEOUS | Status: AC
  Administered 2022-09-28: 6 mg via SUBCUTANEOUS
  Filled 2022-09-28: qty 0.6

## 2022-10-01 ENCOUNTER — Other Ambulatory Visit: Payer: Self-pay

## 2022-10-16 ENCOUNTER — Other Ambulatory Visit: Payer: Self-pay

## 2022-10-16 MED FILL — Dexamethasone Sodium Phosphate Inj 100 MG/10ML: INTRAMUSCULAR | Qty: 1 | Status: AC

## 2022-10-17 ENCOUNTER — Encounter: Payer: Self-pay | Admitting: Hematology

## 2022-10-17 ENCOUNTER — Other Ambulatory Visit: Payer: Self-pay

## 2022-10-17 ENCOUNTER — Inpatient Hospital Stay: Payer: Medicaid Other | Attending: Physician Assistant | Admitting: Hematology

## 2022-10-17 ENCOUNTER — Inpatient Hospital Stay: Payer: Medicaid Other

## 2022-10-17 VITALS — BP 135/89 | HR 88 | Temp 99.9°F | Resp 18 | Ht 68.0 in | Wt 191.6 lb

## 2022-10-17 VITALS — Temp 99.5°F

## 2022-10-17 DIAGNOSIS — Z95828 Presence of other vascular implants and grafts: Secondary | ICD-10-CM

## 2022-10-17 DIAGNOSIS — C162 Malignant neoplasm of body of stomach: Secondary | ICD-10-CM

## 2022-10-17 DIAGNOSIS — C786 Secondary malignant neoplasm of retroperitoneum and peritoneum: Secondary | ICD-10-CM | POA: Insufficient documentation

## 2022-10-17 DIAGNOSIS — C169 Malignant neoplasm of stomach, unspecified: Secondary | ICD-10-CM | POA: Insufficient documentation

## 2022-10-17 DIAGNOSIS — Z5189 Encounter for other specified aftercare: Secondary | ICD-10-CM | POA: Diagnosis not present

## 2022-10-17 DIAGNOSIS — Z5111 Encounter for antineoplastic chemotherapy: Secondary | ICD-10-CM | POA: Insufficient documentation

## 2022-10-17 DIAGNOSIS — D500 Iron deficiency anemia secondary to blood loss (chronic): Secondary | ICD-10-CM

## 2022-10-17 LAB — CBC WITH DIFFERENTIAL (CANCER CENTER ONLY)
Abs Immature Granulocytes: 0.03 10*3/uL (ref 0.00–0.07)
Basophils Absolute: 0 10*3/uL (ref 0.0–0.1)
Basophils Relative: 1 %
Eosinophils Absolute: 0 10*3/uL (ref 0.0–0.5)
Eosinophils Relative: 0 %
HCT: 39.8 % (ref 36.0–46.0)
Hemoglobin: 13.4 g/dL (ref 12.0–15.0)
Immature Granulocytes: 0 %
Lymphocytes Relative: 11 %
Lymphs Abs: 0.9 10*3/uL (ref 0.7–4.0)
MCH: 30.2 pg (ref 26.0–34.0)
MCHC: 33.7 g/dL (ref 30.0–36.0)
MCV: 89.6 fL (ref 80.0–100.0)
Monocytes Absolute: 0.8 10*3/uL (ref 0.1–1.0)
Monocytes Relative: 10 %
Neutro Abs: 6.1 10*3/uL (ref 1.7–7.7)
Neutrophils Relative %: 78 %
Platelet Count: 251 10*3/uL (ref 150–400)
RBC: 4.44 MIL/uL (ref 3.87–5.11)
RDW: 16.3 % — ABNORMAL HIGH (ref 11.5–15.5)
WBC Count: 7.9 10*3/uL (ref 4.0–10.5)
nRBC: 0 % (ref 0.0–0.2)

## 2022-10-17 LAB — CMP (CANCER CENTER ONLY)
ALT: 27 U/L (ref 0–44)
AST: 39 U/L (ref 15–41)
Albumin: 4 g/dL (ref 3.5–5.0)
Alkaline Phosphatase: 121 U/L (ref 38–126)
Anion gap: 7 (ref 5–15)
BUN: 9 mg/dL (ref 6–20)
CO2: 27 mmol/L (ref 22–32)
Calcium: 9.4 mg/dL (ref 8.9–10.3)
Chloride: 105 mmol/L (ref 98–111)
Creatinine: 0.74 mg/dL (ref 0.44–1.00)
GFR, Estimated: >60 mL/min (ref >60)
Glucose, Bld: 156 mg/dL — ABNORMAL HIGH (ref 70–99)
Potassium: 3.8 mmol/L (ref 3.5–5.1)
Sodium: 139 mmol/L (ref 135–145)
Total Bilirubin: 0.6 mg/dL (ref 0.3–1.2)
Total Protein: 7 g/dL (ref 6.5–8.1)

## 2022-10-17 LAB — FERRITIN: Ferritin: 131 ng/mL (ref 11–307)

## 2022-10-17 MED ORDER — SODIUM CHLORIDE 0.9% FLUSH
10.0000 mL | Freq: Once | INTRAVENOUS | Status: AC
  Administered 2022-10-17: 10 mL

## 2022-10-17 MED ORDER — DIPHENHYDRAMINE HCL 50 MG/ML IJ SOLN
25.0000 mg | Freq: Once | INTRAMUSCULAR | Status: AC
  Administered 2022-10-17: 25 mg via INTRAVENOUS
  Filled 2022-10-17: qty 1

## 2022-10-17 MED ORDER — OXALIPLATIN CHEMO INJECTION 100 MG/20ML
70.0000 mg/m2 | Freq: Once | INTRAVENOUS | Status: AC
  Administered 2022-10-17: 150 mg via INTRAVENOUS
  Filled 2022-10-17: qty 10

## 2022-10-17 MED ORDER — SODIUM CHLORIDE 0.9% FLUSH
10.0000 mL | INTRAVENOUS | Status: DC | PRN
  Administered 2022-10-17: 10 mL

## 2022-10-17 MED ORDER — SODIUM CHLORIDE 0.9 % IV SOLN
10.0000 mg | Freq: Once | INTRAVENOUS | Status: AC
  Administered 2022-10-17: 10 mg via INTRAVENOUS
  Filled 2022-10-17: qty 10

## 2022-10-17 MED ORDER — FAMOTIDINE IN NACL 20-0.9 MG/50ML-% IV SOLN
20.0000 mg | Freq: Once | INTRAVENOUS | Status: AC
  Administered 2022-10-17: 20 mg via INTRAVENOUS
  Filled 2022-10-17: qty 50

## 2022-10-17 MED ORDER — PALONOSETRON HCL INJECTION 0.25 MG/5ML
0.2500 mg | Freq: Once | INTRAVENOUS | Status: AC
  Administered 2022-10-17: 0.25 mg via INTRAVENOUS
  Filled 2022-10-17: qty 5

## 2022-10-17 MED ORDER — SODIUM CHLORIDE 0.9 % IV SOLN
2400.0000 mg/m2 | INTRAVENOUS | Status: DC
  Administered 2022-10-17: 5000 mg via INTRAVENOUS
  Filled 2022-10-17: qty 100

## 2022-10-17 MED ORDER — LEUCOVORIN CALCIUM INJECTION 350 MG
400.0000 mg/m2 | Freq: Once | INTRAVENOUS | Status: AC
  Administered 2022-10-17: 788 mg via INTRAVENOUS
  Filled 2022-10-17: qty 25

## 2022-10-17 MED ORDER — DEXTROSE 5 % IV SOLN: Freq: Once | INTRAVENOUS | Status: AC

## 2022-10-17 NOTE — Patient Instructions (Signed)
[facility]  Discharge Instructions: Thank you for choosing [facility] to provide your oncology and hematology care.   If you have a lab appointment with the Cancer Center, please go directly to the Cancer Center and check in at the registration area.   Wear comfortable clothing and clothing appropriate for easy access to any Portacath or PICC line.   We strive to give you quality time with your provider. You may need to reschedule your appointment if you arrive late (15 or more minutes).  Arriving late affects you and other patients whose appointments are after yours.  Also, if you miss three or more appointments without notifying the office, you may be dismissed from the clinic at the provider's discretion.      For prescription refill requests, have your pharmacy contact our office and allow 72 hours for refills to be completed.    Today you received the following chemotherapy and/or immunotherapy agents Oxaliplatin, Leucovorin, 5FU pump      To help prevent nausea and vomiting after your treatment, we encourage you to take your nausea medication as directed.  BELOW ARE SYMPTOMS THAT SHOULD BE REPORTED IMMEDIATELY: *FEVER GREATER THAN 100.4 F (38 C) OR HIGHER *CHILLS OR SWEATING *NAUSEA AND VOMITING THAT IS NOT CONTROLLED WITH YOUR NAUSEA MEDICATION *UNUSUAL SHORTNESS OF BREATH *UNUSUAL BRUISING OR BLEEDING *URINARY PROBLEMS (pain or burning when urinating, or frequent urination) *BOWEL PROBLEMS (unusual diarrhea, constipation, pain near the anus) TENDERNESS IN MOUTH AND THROAT WITH OR WITHOUT PRESENCE OF ULCERS (sore throat, sores in mouth, or a toothache) UNUSUAL RASH, SWELLING OR PAIN  UNUSUAL VAGINAL DISCHARGE OR ITCHING   Items with * indicate a potential emergency and should be followed up as soon as possible or go to the Emergency Department if any problems should occur.  Please show the CHEMOTHERAPY ALERT CARD or  IMMUNOTHERAPY ALERT CARD at check-in to the Emergency Department and triage nurse.  Should you have questions after your visit or need to cancel or reschedule your appointment, please contact [facility]  Dept: [phone]  and follow the prompts.  Office hours are 8:00 a.m. to 4:30 p.m. Monday - Friday. Please note that voicemails left after 4:00 p.m. may not be returned until the following business day.  We are closed weekends and major holidays. You have access to a nurse at all times for urgent questions. Please call the main number to the clinic Dept: [phone] and follow the prompts.   For any non-urgent questions, you may also contact your provider using MyChart. We now offer e-Visits for anyone 7 and older to request care online for non-urgent symptoms. For details visit [email or web address].   Also download the MyChart app! Go to the app store, search "MyChart", open the app, select [place], and log in with your MyChart username and password.

## 2022-10-17 NOTE — Progress Notes (Deleted)
[facility]   [phone] [phone]   Clinic Follow up Note   Patient Care Team: [name], MD as PCP - General (Internal Medicine) [name], MD as Consulting Physician (Oncology)  Date of Service:  10/17/2022  CHIEF COMPLAINT: f/u of Gastric Cancer    CURRENT THERAPY:  FOLFOX q14d X 12 cycles   ASSESSMENT: *** Sheryl [name] is a 57 y.o. female with   No problem-specific Assessment & Plan notes found for this encounter.  ***   PLAN:      SUMMARY OF ONCOLOGIC HISTORY: Oncology History Overview Note   Cancer Staging  Gastric cancer [facility]) Staging form: Stomach, AJCC 8th Edition - Clinical stage from 04/19/2022: Stage IVB (cT2, cN0, pM1) - Signed by [name], MD on 05/08/2022 Total positive nodes: 0     Gastric cancer (HCC)  03/30/2022 Procedure   EGD:  Impression:  - Normal esophagus. - A few gastric polyps. Biopsied. - Gastritis. Biopsied. - Non-bleeding gastric ulcer with no stigmata of bleeding. Biopsied. - Normal examined duodenum. Biopsied.  Findings: Diffuse moderate inflammation characterized by congestion (edema), friability and granularity was found in the cardia, in the gastric fundus and in the gastric body. There were associated erosions in multiple places. Biopsies were taken from the antrum, body, and fundus with a cold forceps for histology. Estimated blood loss was minimal.  One non-bleeding cratered gastric ulcer with no stigmata of bleeding was found on the greater curvature of the stomach. The lesion was 6 mm in largest dimension. The mucosa around the ulcer was heaped and led to some deformity in the antrum. Biopsies were taken with a cold forceps for histology. Estimated blood loss was minimal.    03/30/2022 Pathology Results   [name]  Accession: [id number]  Diagnosis 1. Surgical [P], duodenal - BENIGN SMALL BOWEL MUCOSA WITH NO SIGNIFICANT PATHOLOGIC CHANGES 2.  Surgical [P], gastric antrum - GASTRIC ANTRAL MUCOSA WITH FEATURES OF REACTIVE GASTROPATHY - NEGATIVE FOR H. PYLORI ON H&E STAIN - NEGATIVE FOR INTESTINAL METAPLASIA OR MALIGNANCY 3. Surgical [P], gastric body - GASTRIC OXYNTIC MUCOSA WITH REACTIVE/REPARATIVE CHANGES - NEGATIVE FOR H. PYLORI ON H&E STAIN - NEGATIVE FOR INTESTINAL METAPLASIA, DYSPLASIA OR MALIGNANCY 4. Surgical [P], greater curve ulceration - ADENOCARCINOMA WITH SIGNET RING CELL FEATURES (SEE NOTE) 5. Surgical [P], gastric polyps - ADENOCARCINOMA WITH SIGNET RING CELL FEATURES (SEE NOTE) 6. Surgical [P], fundus (gastric) - ADENOCARCINOMA WITH SIGNET RING CELL FEATURES (SEE NOTE) 7. Surgical [P], colon, ascending, polyp (1) - TUBULAR ADENOMA. - NO HIGH GRADE DYSPLASIA OR MALIGNANCY. 8. Surgical [P], colon, transverse, polyp (1) - TUBULAR ADENOMA. - NO HIGH GRADE DYSPLASIA OR MALIGNANCY.    04/13/2022 Initial Diagnosis   Gastric cancer (HCC)   04/19/2022 Cancer Staging   Staging form: Stomach, AJCC 8th Edition - Clinical stage from 04/19/2022: Stage IVB (cT2, cN0, pM1) - Signed by [name], MD on 05/08/2022 Total positive nodes: 0   05/05/2022 Genetic Testing   Negative genetic testing on the Multi-cancer gene panel + RNA.  FH c.259C>T VUS identified.  The report date is May 05, 2022.  The Multi-Cancer + RNA Panel offered by [facility] includes sequencing and/or deletion/duplication analysis of the following 70 genes:  AIP*, ALK, APC*, ATM*, AXIN2*, BAP1*, BARD1*, BLM*, BMPR1A*, BRCA1*, BRCA2*, BRIP1*, CDC73*, CDH1*, CDK4, CDKN1B*, CDKN2A, CHEK2*, CTNNA1*, DICER1*, EPCAM (del/dup only), EGFR, FH*, FLCN*, GREM1 (promoter dup only), HOXB13, KIT, LZTR1, MAX*, MBD4, MEN1*, MET, MITF, MLH1*, MSH2*, MSH3*, MSH6*, MUTYH*, NF1*,  NF2*, NTHL1*, PALB2*, PDGFRA, PMS2*, POLD1*, POLE*, POT1*, PRKAR1A*, PTCH1*, PTEN*, RAD51C*, RAD51D*, RB1*, RET, SDHA* (sequencing only), SDHAF2*, SDHB*, SDHC*, SDHD*, SMAD4*, SMARCA4*, SMARCB1*, SMARCE1*,  STK11*, SUFU*, TMEM127*, TP53*, TSC1*, TSC2*, VHL*. RNA analysis is performed for * genes.    05/09/2022 - 06/07/2022 Chemotherapy   Patient is on Treatment Plan : GASTROESOPHAGEAL FLOT q14d X 4 cycles      Miscellaneous   Foundation One  Biomarker Findings Microsatellite status- Cannot be determined Tumor Mutational Burden- Cannot be determined  Genomic Findings  FGFR2 amplification,FGFR2-TACC2 fusion,  Rearrangement intron 17 ARAF amplification CCND3 amplification TP53 V226fs*74     05/30/2022 Imaging    IMPRESSION: 1. Mild hypermetabolism corresponding to a dominant left upper quadrant mass and smaller perigastric nodules or nodes. 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 Recommend attention to the dominant left upper quadrant soft tissue mass on follow-up exams to exclude unlikely recurrent lymphoma. 2. No gastric hypermetabolism and no typical findings of metastatic disease.   06/20/2022 -  Chemotherapy   Patient is on Treatment Plan : GASTRIC FOLFOX q14d x 12 cycles     08/27/2022 Imaging    IMPRESSION: No focal gastric mass on CT.   No findings suspicious for recurrent or metastatic disease.   Stable left upper abdominal soft tissue lesion and small lymph nodes, chronic, favoring treated lymphoma.      INTERVAL HISTORY: *** Sheryl [name] is here for a follow up of      All other systems were reviewed with the patient and are negative.  MEDICAL HISTORY:  Past Medical History:  Diagnosis Date   Blood transfusion without reported diagnosis    had transfusion with hysterectomy   Cataract    Colon polyps 2012   Diabetes (HCC) 03/13/2021   Diabetes (HCC) 05/21/2019   Family history of breast cancer    Family history of pancreatic cancer    Family history of stomach cancer    Fibroid    GERD (gastroesophageal reflux disease)    H/O blood clots    History of hysterectomy    fibroids and heavy cycles    Hypertension     SURGICAL HISTORY: Past Surgical History:  Procedure Laterality Date   ABDOMINAL HYSTERECTOMY     BIOPSY  04/19/2022   Procedure: BIOPSY;  Surgeon: Lemar Lofty., MD;  Location: [facility];  Service: Gastroenterology;;   COLONOSCOPY     ESOPHAGOGASTRODUODENOSCOPY (EGD) WITH PROPOFOL N/A 04/19/2022   Procedure: ESOPHAGOGASTRODUODENOSCOPY (EGD) WITH PROPOFOL;  Surgeon: Lemar Lofty., MD;  Location: [facility];  Service: Gastroenterology;  Laterality: N/A;   EUS N/A 04/19/2022   Procedure: UPPER ENDOSCOPIC ULTRASOUND (EUS) RADIAL;  Surgeon: Lemar Lofty., MD;  Location: [facility];  Service: Gastroenterology;  Laterality: N/A;   EXCISION OF SKIN TAG  05/03/2022   Procedure: EXCISION OF CHEST WALL SKIN LESION;  Surgeon: Fritzi Mandes, MD;  Location: [facility] OR;  Service: General;;   LAPAROSCOPY N/A 05/03/2022   Procedure: LAPAROSCOPY DIAGNOSTIC WITH PERITONEAL WASHINGS;  Surgeon: Fritzi Mandes, MD;  Location: [facility] OR;  Service: General;  Laterality: N/A;   POLYPECTOMY  04/19/2022   Procedure: POLYPECTOMY;  Surgeon: Lemar Lofty., MD;  Location: [facility];  Service: Gastroenterology;;   PORTACATH PLACEMENT N/A 05/03/2022   Procedure: INSERTION PORT-A-CATH WITH ULTRASOUND GUIDANCE;  Surgeon: Fritzi Mandes, MD;  Location: [facility] OR;  Service: General;  Laterality: N/A;   UPPER GASTROINTESTINAL ENDOSCOPY      I have reviewed  the social history and family history with the patient and they are unchanged from previous note.  ALLERGIES:  is allergic to aspirin, cyclobenzaprine, naproxen sodium, zithromax [azithromycin dihydrate], oxaliplatin, and dilaudid [hydromorphone].  MEDICATIONS:  Current Outpatient Medications  Medication Sig Dispense Refill   acetaminophen (TYLENOL) 500 MG tablet Take 2 tablets (1,000 mg total) by mouth every 8 (eight) hours as needed (pain). 30 tablet 1   amLODipine (NORVASC) 10 MG tablet Take 1 tablet (10 mg total)  by mouth daily. 90 tablet 1   Bacillus Coagulans-Inulin (PROBIOTIC-PREBIOTIC) 1-250 BILLION-MG CAPS Take 2 capsules by mouth daily. 30 capsule 1   Biotin 1000 MCG CHEW Chew 1,000 mcg by mouth daily. 30 tablet 2   Blood Glucose Monitoring Suppl (TRUE METRIX METER) w/Device KIT 1 kit by Does not apply route 3 (three) times daily as needed. 1 kit 0   Cholecalciferol (VITAMIN D3) 125 MCG (5000 UT) TABS Take 1 tablet (5,000 Units total) by mouth daily. 30 tablet 2   diphenhydrAMINE-prednisoLONE-nystatin in lidocaine solution Take 5 mls by mouth 3 (three) times daily as needed for mouth pain. 140 mL 0   ondansetron (ZOFRAN-ODT) 4 MG disintegrating tablet Take 2 tablets (8 mg total) by mouth every 8 (eight) hours as needed for nausea or vomiting. Dissolve one tab on tongue 30 minutes before each dose of bowel prep 20 tablet 2   pantoprazole (PROTONIX) 40 MG tablet Take 1 tablet (40 mg total) by mouth 2 (two) times daily. 180 tablet 0   potassium chloride SA (KLOR-CON M) 20 MEQ tablet Take 1 tablet (20 mEq total) by mouth 2 (two) times daily. Change to once daily after 5 days, until finish 30 tablet 0   prochlorperazine (COMPAZINE) 10 MG tablet Take 1 tablet (10 mg total) by mouth every 6 (six) hours as needed for nausea or vomiting. 30 tablet 1   rosuvastatin (CRESTOR) 5 MG tablet Take 1 tablet (5 mg total) by mouth daily. (Patient not taking: Reported on 04/18/2022) 90 tablet 1   sucralfate (CARAFATE) 1 g tablet Take 1 tablet (1 g total) by mouth 2 (two) times daily. 60 tablet 6   Current Facility-Administered Medications  Medication Dose Route Frequency Provider Last Rate Last Admin   0.9 %  sodium chloride infusion  500 mL Intravenous Continuous Tressia Danas, MD        PHYSICAL EXAMINATION: ECOG PERFORMANCE STATUS: {CHL ONC ECOG ZO:[id number]}  There were no vitals filed for this visit. Wt Readings from Last 3 Encounters:  09/26/22 190 lb 6.4 oz (86.4 kg)  09/12/22 190 lb 8 oz (86.4 kg)   08/29/22 193 lb 9.6 oz (87.8 kg)    {Only keep what was examined. If exam not performed, can use .CEXAM } GENERAL:alert, no distress and comfortable SKIN: skin color, texture, turgor are normal, no rashes or significant lesions EYES: normal, Conjunctiva are pink and non-injected, sclera clear {OROPHARYNX:no exudate, no erythema and lips, buccal mucosa, and tongue normal}  NECK: supple, thyroid normal size, non-tender, without nodularity LYMPH:  no palpable lymphadenopathy in the cervical, axillary {or inguinal} LUNGS: clear to auscultation and percussion with normal breathing effort HEART: regular rate & rhythm and no murmurs and no lower extremity edema ABDOMEN:abdomen soft, non-tender and normal bowel sounds Musculoskeletal:no cyanosis of digits and no clubbing  NEURO: alert & oriented x 3 with fluent speech, no focal motor/sensory deficits  LABORATORY DATA:  I have reviewed the data as listed    Latest Ref Rng & Units 09/26/2022  8:28 AM 09/12/2022    8:36 AM 08/27/2022    7:37 AM  CBC  WBC 4.0 - 10.5 K/uL 5.7  6.1  5.6   Hemoglobin 12.0 - 15.0 g/dL 16.1  09.6  04.5   Hematocrit 36.0 - 46.0 % 39.7  38.3  39.5   Platelets 150 - 400 K/uL 233  228  185         Latest Ref Rng & Units 09/26/2022    8:28 AM 09/12/2022    8:36 AM 08/27/2022    7:37 AM  CMP  Glucose 70 - 99 mg/dL 409  811  914   BUN 6 - 20 mg/dL 10  9  7    Creatinine 0.44 - 1.00 mg/dL 7.82  9.56  2.13   Sodium 135 - 145 mmol/L 139  140  143   Potassium 3.5 - 5.1 mmol/L 3.0  3.6  3.5   Chloride 98 - 111 mmol/L 103  105  106   CO2 22 - 32 mmol/L 29  29  29    Calcium 8.9 - 10.3 mg/dL 9.2  9.1  9.5   Total Protein 6.5 - 8.1 g/dL 7.5  7.2  7.2   Total Bilirubin 0.3 - 1.2 mg/dL 0.6  0.6  0.4   Alkaline Phos 38 - 126 U/L 151  135  136   AST 15 - 41 U/L 27  27  26    ALT 0 - 44 U/L 19  21  18        RADIOGRAPHIC STUDIES: I have personally reviewed the radiological images as listed and agreed with the findings in  the report. No results found.    No orders of the defined types were placed in this encounter.  All questions were answered. The patient knows to call the clinic with any problems, questions or concerns. No barriers to learning was detected. The total time spent in the appointment was {CHL ONC TIME VISIT - YQMVH:[id number]}.     Verlee Rossetti, CMA 10/17/2022   I, Sharlette Dense, CMA, am acting as scribe for [name], MD.   {Add scribe attestation statement}

## 2022-10-17 NOTE — Progress Notes (Signed)
[facility]   [phone] [phone]   Clinic Follow up Note    Patient Care Team: [name], MD as PCP - General (Internal Medicine) [name], MD as Consulting Physician (Oncology)   Date of Service:  10/17/2022   CHIEF COMPLAINT: f/u of Gastric Cancer    CURRENT THERAPY:  FOLFOX q14d X 12 cycles  ASSESSMENT:  Sheryl [name] is a 57 y.o. female with    Gastric cancer (HCC) cT2N0M1 with peritoneal metastasis. 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   -she has started first line chemo FLOT on 1/10 -She understands that chemotherapy is palliative, to prolong her life.  We are unlikely going to cure her cancer. -PD-L1 0-1%, no significant benefit from PD-L1 immunotherapy, FO revealed FGFR2 amplification (+), no other targeted therapy available  -She has been tolerating chemo very well, will continue for now  -PET scan from 05/30/2022 was negative for primary tumor or metastatic disease, the known peritoneal mets did not show on PET.  -Foundation One showed no targetable mutations  -she has been tolerating chemo well overall. Due to fatigue, I have changed her chemo from FLOT to FOLFOX on 06/20/2022, she tolerated well -she previously asked the role of surgery, depends on her next restaging CT scan findings, I may refer her to [facility] or [facility] to discuss HIPEC surgery  -due to her infusion reaction to oxaliplatin on C5, we added additional premeds and gave slow infusion over 4 hours for cycle 6 and she tolerated well  -She is not able to return to work due to the cancer and treatment related symptoms.  -She is tolerating FOLFOX well overall, with moderate fatigue for a few days after infusion but able to recover well.  No signs of neuropathy at this point. -Restaging CT abdomen pelvis  from August 27, 2022 showed no residual disease.  -We again discussed maintenance therapy with Xeloda down the road, we will stop oxaliplatin when she develops side effects especially neuropathy, or up to additional 2 months -She just came back from vacation 2 days ago, and developed mild throat discomfort, she had a low temperature 100.2 in office today, her temperature was 99.0. -Lab reviewed, adequate for treatment, will proceed chemo today at the same dose       PLAN: -will repeat CT Scan in August - reviewed labs, will proceed to chemotherapy today at the same dose - next treatment is July 3    SUMMARY OF ONCOLOGIC HISTORY:     Oncology History Overview Note    Cancer Staging  Gastric cancer [facility]) Staging form: Stomach, AJCC 8th Edition - Clinical stage from 04/19/2022: Stage IVB (cT2, cN0, pM1) - Signed by [name], MD on 05/08/2022 Total positive nodes: 0        Gastric cancer (HCC)   03/30/2022 Procedure     EGD:   Impression:  - Normal esophagus. - A few gastric polyps. Biopsied. - Gastritis. Biopsied. - Non-bleeding gastric ulcer with no stigmata of bleeding. Biopsied. - Normal examined duodenum. Biopsied.   Findings: Diffuse moderate inflammation characterized by congestion (edema), friability and granularity was found in the cardia, in the gastric fundus and in the gastric body. There were associated erosions in multiple places. Biopsies were taken from the antrum, body, and fundus with a cold forceps for histology. Estimated blood loss was minimal.  One non-bleeding cratered gastric ulcer with no stigmata of bleeding was found on the greater curvature of the stomach. The lesion was 6 mm in largest dimension. The mucosa around the ulcer was heaped and led to some deformity in the antrum. Biopsies were taken with a cold forceps for histology. Estimated blood loss was minimal.       03/30/2022 Pathology Results     [name], Sheryl [name]  Accession:  [id number]   Diagnosis 1. Surgical [P], duodenal - BENIGN SMALL BOWEL MUCOSA WITH NO SIGNIFICANT PATHOLOGIC CHANGES 2. Surgical [P], gastric antrum - GASTRIC ANTRAL MUCOSA WITH FEATURES OF REACTIVE GASTROPATHY - NEGATIVE FOR H. PYLORI ON H&E STAIN - NEGATIVE FOR INTESTINAL METAPLASIA OR MALIGNANCY 3. Surgical [P], gastric body - GASTRIC OXYNTIC MUCOSA WITH REACTIVE/REPARATIVE CHANGES - NEGATIVE FOR H. PYLORI ON H&E STAIN - NEGATIVE FOR INTESTINAL METAPLASIA, DYSPLASIA OR MALIGNANCY 4. Surgical [P], greater curve ulceration - ADENOCARCINOMA WITH SIGNET RING CELL FEATURES (SEE NOTE) 5. Surgical [P], gastric polyps - ADENOCARCINOMA WITH SIGNET RING CELL FEATURES (SEE NOTE) 6. Surgical [P], fundus (gastric) - ADENOCARCINOMA WITH SIGNET RING CELL FEATURES (SEE NOTE) 7. Surgical [P], colon, ascending, polyp (1) - TUBULAR ADENOMA. - NO HIGH GRADE DYSPLASIA OR MALIGNANCY. 8. Surgical [P], colon, transverse, polyp (1) - TUBULAR ADENOMA. - NO HIGH GRADE DYSPLASIA OR MALIGNANCY.       04/13/2022 Initial Diagnosis     Gastric cancer (HCC)     04/19/2022 Cancer Staging     Staging form: Stomach, AJCC 8th Edition - Clinical stage from 04/19/2022: Stage IVB (cT2, cN0, pM1) - Signed by [name], MD on 05/08/2022 Total positive nodes: 0     05/05/2022 Genetic Testing     Negative genetic testing on the Multi-cancer gene panel + RNA.  FH c.259C>T VUS identified.  The report date is May 05, 2022.   The Multi-Cancer + RNA Panel offered by [facility] includes sequencing and/or deletion/duplication analysis of the following 70 genes:  AIP*, ALK, APC*, ATM*, AXIN2*, BAP1*, BARD1*, BLM*, BMPR1A*, BRCA1*, BRCA2*, BRIP1*, CDC73*, CDH1*, CDK4, CDKN1B*, CDKN2A, CHEK2*, CTNNA1*, DICER1*, EPCAM (del/dup only), EGFR, FH*, FLCN*, GREM1 (promoter dup only), HOXB13, KIT, LZTR1, MAX*, MBD4, MEN1*, MET, MITF, MLH1*, MSH2*, MSH3*, MSH6*, MUTYH*, NF1*, NF2*, NTHL1*, PALB2*, PDGFRA, PMS2*, POLD1*, POLE*, POT1*,  PRKAR1A*, PTCH1*, PTEN*, RAD51C*, RAD51D*, RB1*, RET, SDHA* (sequencing only), SDHAF2*, SDHB*, SDHC*, SDHD*, SMAD4*, SMARCA4*, SMARCB1*, SMARCE1*, STK11*, SUFU*, TMEM127*, TP53*, TSC1*, TSC2*, VHL*. RNA analysis is performed for * genes.      05/09/2022 - 06/07/2022 Chemotherapy     Patient is on Treatment Plan : GASTROESOPHAGEAL FLOT q14d X 4 cycles        Miscellaneous     Foundation One   Biomarker Findings Microsatellite status- Cannot be determined Tumor Mutational Burden- Cannot be determined   Genomic Findings  FGFR2 amplification,FGFR2-TACC2 fusion,  Rearrangement intron 17 ARAF amplification CCND3 amplification TP53 [id number]*74         05/30/2022 Imaging      IMPRESSION: 1. Mild hypermetabolism corresponding to a dominant left upper quadrant mass and smaller perigastric nodules or nodes. 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 Recommend attention to the dominant left upper quadrant soft tissue mass on follow-up exams to exclude unlikely recurrent lymphoma. 2. No gastric hypermetabolism and no typical findings of metastatic disease.     06/20/2022 -  Chemotherapy     Patient is on Treatment Plan : GASTRIC FOLFOX q14d x 12 cycles  08/27/2022 Imaging      IMPRESSION: No focal gastric mass on CT.   No findings suspicious for recurrent or metastatic disease.   Stable left upper abdominal soft tissue lesion and small lymph nodes, chronic, favoring treated lymphoma.           INTERVAL HISTORY:  Sheryl [name] is here for a follow up of Gastric Cancer. She was last seen by me on 09/26/2022. She presents to the clinic accompanied by father. Patient states she has a low grade temp. She is having a scratchy throat. She states she had a runny nose last night. She got back Monday night from a cruise. No shortness of breath. Patient stated she had rectal bleeding before her trip. She stated she was constipated and took benafiber with  Releaf.       All other systems were reviewed with the patient and are negative.   MEDICAL HISTORY:      Past Medical History:  Diagnosis Date   Blood transfusion without reported diagnosis      had transfusion with hysterectomy   Cataract     Colon polyps 2012   Diabetes (HCC) 03/13/2021   Diabetes (HCC) 05/21/2019   Family history of breast cancer     Family history of pancreatic cancer     Family history of stomach cancer     Fibroid     GERD (gastroesophageal reflux disease)     H/O blood clots     History of hysterectomy      fibroids and heavy cycles   Hypertension        SURGICAL HISTORY:      Past Surgical History:  Procedure Laterality Date   ABDOMINAL HYSTERECTOMY       BIOPSY   04/19/2022    Procedure: BIOPSY;  Surgeon: Lemar Lofty., MD;  Location: [facility];  Service: Gastroenterology;;   COLONOSCOPY       ESOPHAGOGASTRODUODENOSCOPY (EGD) WITH PROPOFOL N/A 04/19/2022    Procedure: ESOPHAGOGASTRODUODENOSCOPY (EGD) WITH PROPOFOL;  Surgeon: Lemar Lofty., MD;  Location: [facility];  Service: Gastroenterology;  Laterality: N/A;   EUS N/A 04/19/2022    Procedure: UPPER ENDOSCOPIC ULTRASOUND (EUS) RADIAL;  Surgeon: Lemar Lofty., MD;  Location: [facility];  Service: Gastroenterology;  Laterality: N/A;   EXCISION OF SKIN TAG   05/03/2022    Procedure: EXCISION OF CHEST WALL SKIN LESION;  Surgeon: Fritzi Mandes, MD;  Location: [facility] OR;  Service: General;;   LAPAROSCOPY N/A 05/03/2022    Procedure: LAPAROSCOPY DIAGNOSTIC WITH PERITONEAL WASHINGS;  Surgeon: Fritzi Mandes, MD;  Location: [facility] OR;  Service: General;  Laterality: N/A;   POLYPECTOMY   04/19/2022    Procedure: POLYPECTOMY;  Surgeon: Lemar Lofty., MD;  Location: [facility];  Service: Gastroenterology;;   PORTACATH PLACEMENT N/A 05/03/2022    Procedure: INSERTION PORT-A-CATH WITH ULTRASOUND GUIDANCE;  Surgeon: Fritzi Mandes, MD;  Location: [facility] OR;  Service:  General;  Laterality: N/A;   UPPER GASTROINTESTINAL ENDOSCOPY          I have reviewed the social history and family history with the patient and they are unchanged from previous note.   ALLERGIES:  is allergic to aspirin, cyclobenzaprine, naproxen sodium, zithromax [azithromycin dihydrate], oxaliplatin, and dilaudid [hydromorphone].   MEDICATIONS:        Current Outpatient Medications  Medication Sig Dispense Refill   potassium chloride SA (KLOR-CON M) 20 MEQ tablet Take 1 tablet (20 mEq total) by mouth 2 (two) times daily.  Change to once daily after 5 days, until finish 30 tablet 0   acetaminophen (TYLENOL) 500 MG tablet Take 2 tablets (1,000 mg total) by mouth every 8 (eight) hours as needed (pain). 30 tablet 1   amLODipine (NORVASC) 10 MG tablet Take 1 tablet (10 mg total) by mouth daily. 90 tablet 1   Bacillus Coagulans-Inulin (PROBIOTIC-PREBIOTIC) 1-250 BILLION-MG CAPS Take 2 capsules by mouth daily. 30 capsule 1   Biotin 1000 MCG CHEW Chew 1,000 mcg by mouth daily. 30 tablet 2   Blood Glucose Monitoring Suppl (TRUE METRIX METER) w/Device KIT 1 kit by Does not apply route 3 (three) times daily as needed. 1 kit 0   Cholecalciferol (VITAMIN D3) 125 MCG (5000 UT) TABS Take 1 tablet (5,000 Units total) by mouth daily. 30 tablet 2   diphenhydrAMINE-prednisoLONE-nystatin in lidocaine solution Take 5 mls by mouth 3 (three) times daily as needed for mouth pain. 140 mL 0   ondansetron (ZOFRAN-ODT) 4 MG disintegrating tablet Take 1 tablet (4 mg total) by mouth every 6 (six) hours. Dissolve one tab on tongue 30 minutes before each dose of bowel prep (Patient not taking: Reported on 04/18/2022) 2 tablet 0   pantoprazole (PROTONIX) 40 MG tablet Take 1 tablet (40 mg total) by mouth 2 (two) times daily. 180 tablet 0   prochlorperazine (COMPAZINE) 10 MG tablet Take 1 tablet (10 mg total) by mouth every 6 (six) hours as needed for nausea or vomiting. 30 tablet 1   rosuvastatin (CRESTOR) 5 MG tablet Take  1 tablet (5 mg total) by mouth daily. (Patient not taking: Reported on 04/18/2022) 90 tablet 1   sucralfate (CARAFATE) 1 g tablet Take 1 tablet (1 g total) by mouth 2 (two) times daily. 60 tablet 6             Current Facility-Administered Medications  Medication Dose Route Frequency Provider Last Rate Last Admin   0.9 %  sodium chloride infusion  500 mL Intravenous Continuous Tressia Danas, MD                 Facility-Administered Medications Ordered in Other Visits  Medication Dose Route Frequency Provider Last Rate Last Admin   fluorouracil (ADRUCIL) 5,000 mg in sodium chloride 0.9 % 150 mL chemo infusion  2,400 mg/m2 (Treatment Plan Recorded) Intravenous 1 day or 1 dose [name], MD   Infusion Verify at 09/26/22 1532   sodium chloride flush (NS) 0.9 % injection 10 mL  10 mL Intracatheter PRN [name], MD          PHYSICAL EXAMINATION: ECOG PERFORMANCE STATUS: 1 - Symptomatic but completely ambulatory      Vitals:    09/26/22 0847  BP: 124/89  Pulse: 77  Resp: 15  Temp: 98.3 F (36.8 C)  SpO2: 100%       Wt Readings from Last 3 Encounters:  09/26/22 190 lb 6.4 oz (86.4 kg)  09/12/22 190 lb 8 oz (86.4 kg)  08/29/22 193 lb 9.6 oz (87.8 kg)      GENERAL:alert, no distress and comfortable SKIN: skin color normal, no rashes or significant lesions EYES: normal, Conjunctiva are pink and non-injected, sclera clear  NEURO: alert & oriented x 3 with fluent speech     LABORATORY DATA:  I have reviewed the data as listed    Latest Ref Rng & Units 10/17/2022    8:55 AM 09/26/2022    8:28 AM 09/12/2022    8:36 AM  CBC  WBC 4.0 - 10.5 K/uL  7.9  5.7  6.1   Hemoglobin 12.0 - 15.0 g/dL 78.2  95.6  21.3   Hematocrit 36.0 - 46.0 % 39.8  39.7  38.3   Platelets 150 - 400 K/uL 251  233  228       Latest Ref Rng & Units 10/17/2022    8:55 AM 09/26/2022    8:28 AM 09/12/2022    8:36 AM  CMP  Glucose 70 - 99 mg/dL 086  578  469   BUN 6 - 20 mg/dL 9  10  9    Creatinine 0.44 -  1.00 mg/dL 6.29  5.28  4.13   Sodium 135 - 145 mmol/L 139  139  140   Potassium 3.5 - 5.1 mmol/L 3.8  3.0  3.6   Chloride 98 - 111 mmol/L 105  103  105   CO2 22 - 32 mmol/L 27  29  29    Calcium 8.9 - 10.3 mg/dL 9.4  9.2  9.1   Total Protein 6.5 - 8.1 g/dL 7.0  7.5  7.2   Total Bilirubin 0.3 - 1.2 mg/dL 0.6  0.6  0.6   Alkaline Phos 38 - 126 U/L 121  151  135   AST 15 - 41 U/L 39  27  27   ALT 0 - 44 U/L 27  19  21          RADIOGRAPHIC STUDIES: I have personally reviewed the radiological images as listed and agreed with the findings in the report. Imaging Results (Last 48 hours)  No results found.             Orders Placed This Encounter  Procedures   CBC with Differential (Cancer Center Only)      Standing Status:   Future      Standing Expiration Date:   11/14/2023   CMP (Cancer Center only)      Standing Status:   Future      Standing Expiration Date:   11/14/2023   CBC with Differential (Cancer Center Only)      Standing Status:   Future      Standing Expiration Date:   11/28/2023   CMP (Cancer Center only)      Standing Status:   Future      Standing Expiration Date:   11/28/2023    All questions were answered. The patient knows to call the clinic with any problems, questions or concerns. No barriers to learning was detected. The total time spent in the appointment was 25 minutes.      [name], MD 10/17/2022    I, Sharlette Dense, CMA, am acting as scribe for [name], MD.    I have reviewed the above documentation for accuracy and completeness, and I agree with the above.

## 2022-10-19 ENCOUNTER — Inpatient Hospital Stay: Payer: Medicaid Other

## 2022-10-19 ENCOUNTER — Other Ambulatory Visit: Payer: Self-pay

## 2022-10-19 VITALS — BP 151/90 | HR 82 | Temp 100.2°F | Resp 18

## 2022-10-19 DIAGNOSIS — C162 Malignant neoplasm of body of stomach: Secondary | ICD-10-CM

## 2022-10-19 DIAGNOSIS — Z5111 Encounter for antineoplastic chemotherapy: Secondary | ICD-10-CM | POA: Diagnosis not present

## 2022-10-19 MED ORDER — HEPARIN SOD (PORK) LOCK FLUSH 100 UNIT/ML IV SOLN
500.0000 [IU] | Freq: Once | INTRAVENOUS | Status: AC | PRN
  Administered 2022-10-19: 500 [IU]

## 2022-10-19 MED ORDER — SODIUM CHLORIDE 0.9% FLUSH
10.0000 mL | INTRAVENOUS | Status: DC | PRN
  Administered 2022-10-19: 10 mL

## 2022-10-19 MED ORDER — PEGFILGRASTIM INJECTION 6 MG/0.6ML ~~LOC~~
6.0000 mg | PREFILLED_SYRINGE | Freq: Once | SUBCUTANEOUS | Status: AC
  Administered 2022-10-19: 6 mg via SUBCUTANEOUS
  Filled 2022-10-19: qty 0.6

## 2022-10-30 MED FILL — Dexamethasone Sodium Phosphate Inj 100 MG/10ML: INTRAMUSCULAR | Qty: 1 | Status: AC

## 2022-10-30 NOTE — Assessment & Plan Note (Addendum)
[id number] with peritoneal metastasis. 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   -she started first line chemo FLOT on 1/10 -She understands that chemotherapy is palliative, to prolong her life.  We are unlikely going to cure her cancer. -PD-L1 0-1%, no significant benefit from PD-L1 immunotherapy, FO revealed FGFR2 amplification and fusion (+), FGFR inhibitors can be considered in future, no other targeted therapy available  -She has been tolerating chemo very well, will continue for now  -PET scan from 05/30/2022 was negative for primary tumor or metastatic disease, the known peritoneal mets did not show on PET.  -she has been tolerating chemo well overall. Due to fatigue, I have changed her chemo from FLOT to FOLFOX on 06/20/2022, she tolerated well -she previously asked the role of surgery, depends on her next restaging CT scan findings, I may refer her to [facility] or [facility] to discuss HIPEC surgery  -due to her infusion reaction to oxaliplatin on C5, we added additional premeds and gave slow infusion over 4 hours for cycle 6 and she tolerated well  -She is not able to return to work due to the cancer and treatment related symptoms.  -She is tolerating FOLFOX well overall, with moderate fatigue for a few days after infusion but able to recover well.  No signs of neuropathy at this point. -Restaging CT abdomen pelvis from August 27, 2022 showed no residual disease.  -We again discussed maintenance therapy with Xeloda down the road, we will stop oxaliplatin when she develops side effects especially neuropathy, or after next scan -will repeat CT in a month

## 2022-10-30 NOTE — Progress Notes (Unsigned)
[facility]   Telephone:(336) [phone] Fax:(336) [phone]   Clinic Follow up Note   Patient Care Team: [name], MD as PCP - General (Internal Medicine) [name], MD as Consulting Physician (Oncology)  Date of Service:  10/31/2022  CHIEF COMPLAINT: f/u of Gastric Cancer   CURRENT THERAPY:  FOLFOX q14d X 12 cycles   ASSESSMENT: *** Sheryl [name] is a 57 y.o. 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   -she started first line chemo FLOT on 1/10 -She understands that chemotherapy is palliative, to prolong her life.  We are unlikely going to cure her cancer. -PD-L1 0-1%, no significant benefit from PD-L1 immunotherapy, FO revealed FGFR2 amplification and fusion (+), FGFR inhibitors can be considered in future, no other targeted therapy available  -She has been tolerating chemo very well, will continue for now  -PET scan from 05/30/2022 was negative for primary tumor or metastatic disease, the known peritoneal mets did not show on PET.  -she has been tolerating chemo well overall. Due to fatigue, I have changed her chemo from FLOT to FOLFOX on 06/20/2022, she tolerated well -she previously asked the role of surgery, depends on her next restaging CT scan findings, I may refer her to [facility] or [facility] to discuss HIPEC surgery  -due to her infusion reaction to oxaliplatin on C5, we added additional premeds and gave slow infusion over 4 hours for cycle 6 and she tolerated well  -She is not able to return to work due to the cancer and treatment related symptoms.  -She is tolerating FOLFOX well overall, with moderate fatigue for a few days after infusion but able to recover well.  No signs of neuropathy at this point. -Restaging CT abdomen pelvis  from August 27, 2022 showed no residual disease.  -We again discussed maintenance therapy with Xeloda down the road, we will stop oxaliplatin when she develops side effects especially neuropathy, or after next scan -will repeat CT in a month      PLAN: -lab reviewe -repeat  CT scan in 4 weeks -proceed with FOLFOX today -I order CT CAP in 4 weeks -lab/flush and treatment 7/17   SUMMARY OF ONCOLOGIC HISTORY: Oncology History Overview Note   Cancer Staging  Gastric cancer [facility]) Staging form: Stomach, AJCC 8th Edition - Clinical stage from 04/19/2022: Stage IVB (cT2, cN0, pM1) - Signed by [name], MD on 05/08/2022 Total positive nodes: 0     Gastric cancer (HCC)  03/30/2022 Procedure   EGD:  Impression:  - Normal esophagus. - A few gastric polyps. Biopsied. - Gastritis. Biopsied. - Non-bleeding gastric ulcer with no stigmata of bleeding. Biopsied. - Normal examined duodenum. Biopsied.  Findings: Diffuse moderate inflammation characterized by congestion (edema), friability and granularity was found in the cardia, in the gastric fundus and in the gastric body. There were associated erosions in multiple places. Biopsies were taken from the antrum, body, and fundus with a cold forceps for histology. Estimated blood loss was minimal.  One non-bleeding cratered gastric ulcer with no stigmata of bleeding was found on the greater curvature of the stomach. The lesion was 6 mm in largest dimension. The mucosa around the ulcer was heaped and led to some deformity in the antrum. Biopsies were taken with a cold forceps for histology. Estimated blood loss was  minimal.    03/30/2022 Pathology Results   [name]  Accession: [id number]  Diagnosis 1. Surgical [P], duodenal - BENIGN SMALL BOWEL MUCOSA WITH NO SIGNIFICANT PATHOLOGIC CHANGES 2. Surgical [P], gastric antrum - GASTRIC ANTRAL MUCOSA WITH FEATURES OF REACTIVE GASTROPATHY - NEGATIVE FOR H. PYLORI ON H&E  STAIN - NEGATIVE FOR INTESTINAL METAPLASIA OR MALIGNANCY 3. Surgical [P], gastric body - GASTRIC OXYNTIC MUCOSA WITH REACTIVE/REPARATIVE CHANGES - NEGATIVE FOR H. PYLORI ON H&E STAIN - NEGATIVE FOR INTESTINAL METAPLASIA, DYSPLASIA OR MALIGNANCY 4. Surgical [P], greater curve ulceration - ADENOCARCINOMA WITH SIGNET RING CELL FEATURES (SEE NOTE) 5. Surgical [P], gastric polyps - ADENOCARCINOMA WITH SIGNET RING CELL FEATURES (SEE NOTE) 6. Surgical [P], fundus (gastric) - ADENOCARCINOMA WITH SIGNET RING CELL FEATURES (SEE NOTE) 7. Surgical [P], colon, ascending, polyp (1) - TUBULAR ADENOMA. - NO HIGH GRADE DYSPLASIA OR MALIGNANCY. 8. Surgical [P], colon, transverse, polyp (1) - TUBULAR ADENOMA. - NO HIGH GRADE DYSPLASIA OR MALIGNANCY.    04/13/2022 Initial Diagnosis   Gastric cancer (HCC)   04/19/2022 Cancer Staging   Staging form: Stomach, AJCC 8th Edition - Clinical stage from 04/19/2022: Stage IVB (cT2, cN0, pM1) - Signed by [name], MD on 05/08/2022 Total positive nodes: 0   05/05/2022 Genetic Testing   Negative genetic testing on the Multi-cancer gene panel + RNA.  FH c.259C>T VUS identified.  The report date is May 05, 2022.  The Multi-Cancer + RNA Panel offered by [facility] includes sequencing and/or deletion/duplication analysis of the following 70 genes:  AIP*, ALK, APC*, ATM*, AXIN2*, BAP1*, BARD1*, BLM*, BMPR1A*, BRCA1*, BRCA2*, BRIP1*, CDC73*, CDH1*, CDK4, CDKN1B*, CDKN2A, CHEK2*, CTNNA1*, DICER1*, EPCAM (del/dup only), EGFR, FH*, FLCN*, GREM1 (promoter dup only), HOXB13, KIT, LZTR1, MAX*, MBD4, MEN1*, MET, MITF, MLH1*, MSH2*, MSH3*, MSH6*, MUTYH*, NF1*, NF2*, NTHL1*, PALB2*, PDGFRA, PMS2*, POLD1*, POLE*, POT1*, PRKAR1A*, PTCH1*, PTEN*, RAD51C*, RAD51D*, RB1*, RET, SDHA* (sequencing only), SDHAF2*, SDHB*, SDHC*, SDHD*, SMAD4*, SMARCA4*, SMARCB1*, SMARCE1*, STK11*, SUFU*, TMEM127*, TP53*, TSC1*, TSC2*, VHL*. RNA analysis is performed for * genes.    05/09/2022 - 06/07/2022  Chemotherapy   Patient is on Treatment Plan : GASTROESOPHAGEAL FLOT q14d X 4 cycles      Miscellaneous   Foundation One  Biomarker Findings Microsatellite status- Cannot be determined Tumor Mutational Burden- Cannot be determined  Genomic Findings  FGFR2 amplification,FGFR2-TACC2 fusion,  Rearrangement intron 17 ARAF amplification CCND3 amplification TP53 V263fs*74     05/30/2022 Imaging    IMPRESSION: 1. Mild hypermetabolism corresponding to a dominant left upper quadrant mass and smaller perigastric nodules or nodes. 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 Recommend attention to the dominant left upper quadrant soft tissue mass on follow-up exams to exclude unlikely recurrent lymphoma. 2. No gastric hypermetabolism and no typical findings of metastatic disease.   06/20/2022 -  Chemotherapy   Patient is on Treatment Plan : GASTRIC FOLFOX q14d x 12 cycles     08/27/2022 Imaging    IMPRESSION: No focal gastric mass on CT.   No findings suspicious for recurrent or metastatic disease.   Stable left upper abdominal soft tissue lesion and small lymph nodes, chronic, favoring treated lymphoma.      INTERVAL HISTORY:  Sheryl [name] is here for a follow up of Gastric Cancer. She was last seen by me on 10/17/2022. She presents to the clinic accompanied by father. Pt state that she has numbness and tinging in her hands. Pt state that she feel like her digestive system feels  backed up when she ate a salad and she state that it lasted for about a few days.     All other systems were reviewed with the patient and are negative.  MEDICAL HISTORY:  Past Medical History:  Diagnosis Date   Blood transfusion without reported diagnosis    had transfusion with hysterectomy   Cataract    Colon polyps 2012   Diabetes (HCC) 03/13/2021   Diabetes (HCC) 05/21/2019   Family history of breast cancer    Family history of pancreatic cancer     Family history of stomach cancer    Fibroid    GERD (gastroesophageal reflux disease)    H/O blood clots    History of hysterectomy    fibroids and heavy cycles   Hypertension     SURGICAL HISTORY: Past Surgical History:  Procedure Laterality Date   ABDOMINAL HYSTERECTOMY     BIOPSY  04/19/2022   Procedure: BIOPSY;  Surgeon: Lemar Lofty., MD;  Location: [facility];  Service: Gastroenterology;;   COLONOSCOPY     ESOPHAGOGASTRODUODENOSCOPY (EGD) WITH PROPOFOL N/A 04/19/2022   Procedure: ESOPHAGOGASTRODUODENOSCOPY (EGD) WITH PROPOFOL;  Surgeon: Lemar Lofty., MD;  Location: [facility];  Service: Gastroenterology;  Laterality: N/A;   EUS N/A 04/19/2022   Procedure: UPPER ENDOSCOPIC ULTRASOUND (EUS) RADIAL;  Surgeon: Lemar Lofty., MD;  Location: [facility];  Service: Gastroenterology;  Laterality: N/A;   EXCISION OF SKIN TAG  05/03/2022   Procedure: EXCISION OF CHEST WALL SKIN LESION;  Surgeon: Fritzi Mandes, MD;  Location: [facility] OR;  Service: General;;   LAPAROSCOPY N/A 05/03/2022   Procedure: LAPAROSCOPY DIAGNOSTIC WITH PERITONEAL WASHINGS;  Surgeon: Fritzi Mandes, MD;  Location: [facility] OR;  Service: General;  Laterality: N/A;   POLYPECTOMY  04/19/2022   Procedure: POLYPECTOMY;  Surgeon: Lemar Lofty., MD;  Location: [facility];  Service: Gastroenterology;;   PORTACATH PLACEMENT N/A 05/03/2022   Procedure: INSERTION PORT-A-CATH WITH ULTRASOUND GUIDANCE;  Surgeon: Fritzi Mandes, MD;  Location: [facility] OR;  Service: General;  Laterality: N/A;   UPPER GASTROINTESTINAL ENDOSCOPY      I have reviewed the social history and family history with the patient and they are unchanged from previous note.  ALLERGIES:  is allergic to aspirin, cyclobenzaprine, naproxen sodium, zithromax [azithromycin dihydrate], oxaliplatin, and dilaudid [hydromorphone].  MEDICATIONS:  Current Outpatient Medications  Medication Sig Dispense Refill   acetaminophen (TYLENOL) 500  MG tablet Take 2 tablets (1,000 mg total) by mouth every 8 (eight) hours as needed (pain). 30 tablet 1   amLODipine (NORVASC) 10 MG tablet Take 1 tablet (10 mg total) by mouth daily. 90 tablet 1   Bacillus Coagulans-Inulin (PROBIOTIC-PREBIOTIC) 1-250 BILLION-MG CAPS Take 2 capsules by mouth daily. 30 capsule 1   Biotin 1000 MCG CHEW Chew 1,000 mcg by mouth daily. 30 tablet 2   Blood Glucose Monitoring Suppl (TRUE METRIX METER) w/Device KIT 1 kit by Does not apply route 3 (three) times daily as needed. 1 kit 0   Cholecalciferol (VITAMIN D3) 125 MCG (5000 UT) TABS Take 1 tablet (5,000 Units total) by mouth daily. 30 tablet 2   diphenhydrAMINE-prednisoLONE-nystatin in lidocaine solution Take 5 mls by mouth 3 (three) times daily as needed for mouth pain. 140 mL 0   ondansetron (ZOFRAN-ODT) 4 MG disintegrating tablet Take 2 tablets (8 mg total) by mouth every 8 (eight) hours as needed for nausea or vomiting. Dissolve one tab on tongue 30 minutes before each dose of bowel prep 20 tablet 2  pantoprazole (PROTONIX) 40 MG tablet Take 1 tablet (40 mg total) by mouth 2 (two) times daily. 180 tablet 0   potassium chloride SA (KLOR-CON M) 20 MEQ tablet Take 1 tablet (20 mEq total) by mouth 2 (two) times daily. Change to once daily after 5 days, until finish 30 tablet 0   prochlorperazine (COMPAZINE) 10 MG tablet Take 1 tablet (10 mg total) by mouth every 6 (six) hours as needed for nausea or vomiting. 30 tablet 1   rosuvastatin (CRESTOR) 5 MG tablet Take 1 tablet (5 mg total) by mouth daily. (Patient not taking: Reported on 04/18/2022) 90 tablet 1   sucralfate (CARAFATE) 1 g tablet Take 1 tablet (1 g total) by mouth 2 (two) times daily. 60 tablet 6   Current Facility-Administered Medications  Medication Dose Route Frequency Provider Last Rate Last Admin   0.9 %  sodium chloride infusion  500 mL Intravenous Continuous Tressia Danas, MD        PHYSICAL EXAMINATION: ECOG PERFORMANCE STATUS: {CHL ONC ECOG  ZO:[id number]}  Vitals:   10/31/22 0845  BP: 123/82  Pulse: 76  Resp: 15  Temp: 97.9 F (36.6 C)  SpO2: 100%   Wt Readings from Last 3 Encounters:  10/31/22 192 lb 8 oz (87.3 kg)  10/17/22 191 lb 9.6 oz (86.9 kg)  09/26/22 190 lb 6.4 oz (86.4 kg)     GENERAL:alert, no distress and comfortable SKIN: skin color normal, no rashes or significant lesions EYES: normal, Conjunctiva are pink and non-injected, sclera clear  NEURO: alert & oriented x 3 with fluent speech LABORATORY DATA:  I have reviewed the data as listed    Latest Ref Rng & Units 10/31/2022    8:24 AM 10/17/2022    8:55 AM 09/26/2022    8:28 AM  CBC  WBC 4.0 - 10.5 K/uL 5.1  7.9  5.7   Hemoglobin 12.0 - 15.0 g/dL 81.1  91.4  78.2   Hematocrit 36.0 - 46.0 % 38.5  39.8  39.7   Platelets 150 - 400 K/uL 260  251  233         Latest Ref Rng & Units 10/17/2022    8:55 AM 09/26/2022    8:28 AM 09/12/2022    8:36 AM  CMP  Glucose 70 - 99 mg/dL 956  213  086   BUN 6 - 20 mg/dL 9  10  9    Creatinine 0.44 - 1.00 mg/dL 5.78  4.69  6.29   Sodium 135 - 145 mmol/L 139  139  140   Potassium 3.5 - 5.1 mmol/L 3.8  3.0  3.6   Chloride 98 - 111 mmol/L 105  103  105   CO2 22 - 32 mmol/L 27  29  29    Calcium 8.9 - 10.3 mg/dL 9.4  9.2  9.1   Total Protein 6.5 - 8.1 g/dL 7.0  7.5  7.2   Total Bilirubin 0.3 - 1.2 mg/dL 0.6  0.6  0.6   Alkaline Phos 38 - 126 U/L 121  151  135   AST 15 - 41 U/L 39  27  27   ALT 0 - 44 U/L 27  19  21        RADIOGRAPHIC STUDIES: I have personally reviewed the radiological images as listed and agreed with the findings in the report. No results found.    No orders of the defined types were placed in this encounter.  All questions were answered. The patient knows to call the clinic  with any problems, questions or concerns. No barriers to learning was detected. The total time spent in the appointment was {CHL ONC TIME VISIT - [id number]}.     Salome Holmes, CMA 10/31/2022   I,  Monica Martinez, CMA, am acting as scribe for [name], MD.   {Add scribe attestation statement}

## 2022-10-31 ENCOUNTER — Inpatient Hospital Stay: Payer: Medicaid Other

## 2022-10-31 ENCOUNTER — Inpatient Hospital Stay: Payer: Medicaid Other | Attending: Physician Assistant | Admitting: Hematology

## 2022-10-31 ENCOUNTER — Other Ambulatory Visit: Payer: Self-pay

## 2022-10-31 ENCOUNTER — Encounter: Payer: Self-pay | Admitting: Hematology

## 2022-10-31 ENCOUNTER — Inpatient Hospital Stay: Payer: Medicaid Other | Admitting: Dietician

## 2022-10-31 VITALS — BP 123/82 | HR 76 | Temp 97.9°F | Resp 15 | Ht 68.0 in | Wt 192.5 lb

## 2022-10-31 DIAGNOSIS — Z79899 Other long term (current) drug therapy: Secondary | ICD-10-CM | POA: Diagnosis not present

## 2022-10-31 DIAGNOSIS — C162 Malignant neoplasm of body of stomach: Secondary | ICD-10-CM

## 2022-10-31 DIAGNOSIS — Z5189 Encounter for other specified aftercare: Secondary | ICD-10-CM | POA: Diagnosis not present

## 2022-10-31 DIAGNOSIS — C786 Secondary malignant neoplasm of retroperitoneum and peritoneum: Secondary | ICD-10-CM | POA: Insufficient documentation

## 2022-10-31 DIAGNOSIS — Z95828 Presence of other vascular implants and grafts: Secondary | ICD-10-CM

## 2022-10-31 DIAGNOSIS — C169 Malignant neoplasm of stomach, unspecified: Secondary | ICD-10-CM | POA: Diagnosis present

## 2022-10-31 DIAGNOSIS — Z5111 Encounter for antineoplastic chemotherapy: Secondary | ICD-10-CM | POA: Insufficient documentation

## 2022-10-31 LAB — CBC WITH DIFFERENTIAL (CANCER CENTER ONLY)
Abs Immature Granulocytes: 0.01 10*3/uL (ref 0.00–0.07)
Basophils Absolute: 0 10*3/uL (ref 0.0–0.1)
Basophils Relative: 1 %
Eosinophils Absolute: 0.1 10*3/uL (ref 0.0–0.5)
Eosinophils Relative: 3 %
HCT: 38.5 % (ref 36.0–46.0)
Hemoglobin: 13.2 g/dL (ref 12.0–15.0)
Immature Granulocytes: 0 %
Lymphocytes Relative: 19 %
Lymphs Abs: 1 10*3/uL (ref 0.7–4.0)
MCH: 30.8 pg (ref 26.0–34.0)
MCHC: 34.3 g/dL (ref 30.0–36.0)
MCV: 89.7 fL (ref 80.0–100.0)
Monocytes Absolute: 0.6 10*3/uL (ref 0.1–1.0)
Monocytes Relative: 12 %
Neutro Abs: 3.3 10*3/uL (ref 1.7–7.7)
Neutrophils Relative %: 65 %
Platelet Count: 260 10*3/uL (ref 150–400)
RBC: 4.29 MIL/uL (ref 3.87–5.11)
RDW: 15.9 % — ABNORMAL HIGH (ref 11.5–15.5)
WBC Count: 5.1 10*3/uL (ref 4.0–10.5)
nRBC: 0 % (ref 0.0–0.2)

## 2022-10-31 LAB — CMP (CANCER CENTER ONLY)
ALT: 20 U/L (ref 0–44)
AST: 28 U/L (ref 15–41)
Albumin: 4 g/dL (ref 3.5–5.0)
Alkaline Phosphatase: 129 U/L — ABNORMAL HIGH (ref 38–126)
Anion gap: 7 (ref 5–15)
BUN: 9 mg/dL (ref 6–20)
CO2: 28 mmol/L (ref 22–32)
Calcium: 9.2 mg/dL (ref 8.9–10.3)
Chloride: 105 mmol/L (ref 98–111)
Creatinine: 0.7 mg/dL (ref 0.44–1.00)
GFR, Estimated: >60 mL/min (ref >60)
Glucose, Bld: 166 mg/dL — ABNORMAL HIGH (ref 70–99)
Potassium: 3.5 mmol/L (ref 3.5–5.1)
Sodium: 140 mmol/L (ref 135–145)
Total Bilirubin: 0.5 mg/dL (ref 0.3–1.2)
Total Protein: 6.6 g/dL (ref 6.5–8.1)

## 2022-10-31 MED ORDER — DIPHENHYDRAMINE HCL 50 MG/ML IJ SOLN
25.0000 mg | Freq: Once | INTRAMUSCULAR | Status: AC
  Administered 2022-10-31: 25 mg via INTRAVENOUS
  Filled 2022-10-31: qty 1

## 2022-10-31 MED ORDER — LEUCOVORIN CALCIUM INJECTION 350 MG
400.0000 mg/m2 | Freq: Once | INTRAVENOUS | Status: AC
  Administered 2022-10-31: 788 mg via INTRAVENOUS
  Filled 2022-10-31: qty 25

## 2022-10-31 MED ORDER — HEPARIN SOD (PORK) LOCK FLUSH 100 UNIT/ML IV SOLN: 500.0000 [IU] | Freq: Once | INTRAVENOUS | Status: DC | PRN

## 2022-10-31 MED ORDER — PALONOSETRON HCL INJECTION 0.25 MG/5ML
0.2500 mg | Freq: Once | INTRAVENOUS | Status: AC
  Administered 2022-10-31: 0.25 mg via INTRAVENOUS
  Filled 2022-10-31: qty 5

## 2022-10-31 MED ORDER — SODIUM CHLORIDE 0.9% FLUSH: 10.0000 mL | INTRAVENOUS | Status: DC | PRN

## 2022-10-31 MED ORDER — SODIUM CHLORIDE 0.9 % IV SOLN
10.0000 mg | Freq: Once | INTRAVENOUS | Status: AC
  Administered 2022-10-31: 10 mg via INTRAVENOUS
  Filled 2022-10-31: qty 10

## 2022-10-31 MED ORDER — FAMOTIDINE IN NACL 20-0.9 MG/50ML-% IV SOLN
20.0000 mg | Freq: Once | INTRAVENOUS | Status: AC
  Administered 2022-10-31: 20 mg via INTRAVENOUS
  Filled 2022-10-31: qty 50

## 2022-10-31 MED ORDER — SODIUM CHLORIDE 0.9% FLUSH
10.0000 mL | Freq: Once | INTRAVENOUS | Status: AC
  Administered 2022-10-31: 10 mL

## 2022-10-31 MED ORDER — DEXTROSE 5 % IV SOLN: Freq: Once | INTRAVENOUS | Status: AC

## 2022-10-31 MED ORDER — OXALIPLATIN CHEMO INJECTION 100 MG/20ML
70.0000 mg/m2 | Freq: Once | INTRAVENOUS | Status: AC
  Administered 2022-10-31: 150 mg via INTRAVENOUS
  Filled 2022-10-31: qty 10

## 2022-10-31 MED ORDER — LIDOCAINE-PRILOCAINE 2.5-2.5 % EX CREA
1.0000 | TOPICAL_CREAM | CUTANEOUS | 1 refills | Status: DC | PRN
  Filled 2022-10-31 – 2022-11-27 (×3): qty 30, 30d supply, fill #0

## 2022-10-31 MED ORDER — SODIUM CHLORIDE 0.9 % IV SOLN
2400.0000 mg/m2 | INTRAVENOUS | Status: DC
  Administered 2022-10-31: 5000 mg via INTRAVENOUS
  Filled 2022-10-31: qty 100

## 2022-10-31 NOTE — Patient Instructions (Signed)
[facility]  Discharge Instructions: Thank you for choosing [facility] to provide your oncology and hematology care.   If you have a lab appointment with the Cancer Center, please go directly to the Cancer Center and check in at the registration area.   Wear comfortable clothing and clothing appropriate for easy access to any Portacath or PICC line.   We strive to give you quality time with your provider. You may need to reschedule your appointment if you arrive late (15 or more minutes).  Arriving late affects you and other patients whose appointments are after yours.  Also, if you miss three or more appointments without notifying the office, you may be dismissed from the clinic at the provider's discretion.      For prescription refill requests, have your pharmacy contact our office and allow 72 hours for refills to be completed.    Today you received the following chemotherapy and/or immunotherapy agents Oxaliplatin, Leucovorin, 5FU pump      To help prevent nausea and vomiting after your treatment, we encourage you to take your nausea medication as directed.  BELOW ARE SYMPTOMS THAT SHOULD BE REPORTED IMMEDIATELY: *FEVER GREATER THAN 100.4 F (38 C) OR HIGHER *CHILLS OR SWEATING *NAUSEA AND VOMITING THAT IS NOT CONTROLLED WITH YOUR NAUSEA MEDICATION *UNUSUAL SHORTNESS OF BREATH *UNUSUAL BRUISING OR BLEEDING *URINARY PROBLEMS (pain or burning when urinating, or frequent urination) *BOWEL PROBLEMS (unusual diarrhea, constipation, pain near the anus) TENDERNESS IN MOUTH AND THROAT WITH OR WITHOUT PRESENCE OF ULCERS (sore throat, sores in mouth, or a toothache) UNUSUAL RASH, SWELLING OR PAIN  UNUSUAL VAGINAL DISCHARGE OR ITCHING   Items with * indicate a potential emergency and should be followed up as soon as possible or go to the Emergency Department if any problems should occur.  Please show the CHEMOTHERAPY ALERT CARD or  IMMUNOTHERAPY ALERT CARD at check-in to the Emergency Department and triage nurse.  Should you have questions after your visit or need to cancel or reschedule your appointment, please contact [facility]  Dept: [phone]  and follow the prompts.  Office hours are 8:00 a.m. to 4:30 p.m. Monday - Friday. Please note that voicemails left after 4:00 p.m. may not be returned until the following business day.  We are closed weekends and major holidays. You have access to a nurse at all times for urgent questions. Please call the main number to the clinic Dept: [phone] and follow the prompts.   For any non-urgent questions, you may also contact your provider using MyChart. We now offer e-Visits for anyone 18 and older to request care online for non-urgent symptoms. For details visit [email or web address].   Also download the MyChart app! Go to the app store, search "MyChart", open the app, select [facility], and log in with your MyChart username and password.   

## 2022-10-31 NOTE — Progress Notes (Signed)
Nutrition Follow-up:  Patient with gastric cancer. She is receiving neoadjuvant FOLFOX q14d    Met with patient in infusion. She reports doing well. Patient has a good appetite, eating chips and tuna sub at visit. Patient tried eating lettuce last week. This "messed" her stomach up. She was advised to avoid raw vegetables/fruits. Patient is drinking water as well as watermelon juice. She prepares this fresh at home. Patient denies nausea, vomiting, diarrhea, constipation. Reports taking scheduled antiemetics. She is taking metamucil daily.    Medications: reviewed   Labs: reviewed  Anthropometrics: Wt 192 lb 8 oz today   6/19 - 191 lb 9.6 oz 5/29 - 190 lb 6.4 oz    NUTRITION DIAGNOSIS: Unintended wt loss improved    INTERVENTION:  Continue high calorie, high protein foods for weight maintenance    MONITORING, EVALUATION, GOAL: weight trends, intake   NEXT VISIT: To be scheduled as needed

## 2022-11-01 ENCOUNTER — Encounter: Payer: Self-pay | Admitting: Hematology

## 2022-11-02 ENCOUNTER — Other Ambulatory Visit: Payer: Self-pay

## 2022-11-02 ENCOUNTER — Inpatient Hospital Stay: Payer: Medicaid Other

## 2022-11-02 DIAGNOSIS — Z5111 Encounter for antineoplastic chemotherapy: Secondary | ICD-10-CM | POA: Diagnosis not present

## 2022-11-02 DIAGNOSIS — C162 Malignant neoplasm of body of stomach: Secondary | ICD-10-CM

## 2022-11-02 MED ORDER — HEPARIN SOD (PORK) LOCK FLUSH 100 UNIT/ML IV SOLN
500.0000 [IU] | Freq: Once | INTRAVENOUS | Status: AC | PRN
  Administered 2022-11-02: 500 [IU]

## 2022-11-02 MED ORDER — SODIUM CHLORIDE 0.9% FLUSH
10.0000 mL | INTRAVENOUS | Status: DC | PRN
  Administered 2022-11-02: 10 mL

## 2022-11-02 MED ORDER — PEGFILGRASTIM INJECTION 6 MG/0.6ML ~~LOC~~
6.0000 mg | PREFILLED_SYRINGE | Freq: Once | SUBCUTANEOUS | Status: AC
  Administered 2022-11-02: 6 mg via SUBCUTANEOUS
  Filled 2022-11-02: qty 0.6

## 2022-11-06 ENCOUNTER — Other Ambulatory Visit: Payer: Self-pay

## 2022-11-12 ENCOUNTER — Other Ambulatory Visit: Payer: Self-pay | Admitting: Hematology

## 2022-11-12 ENCOUNTER — Other Ambulatory Visit: Payer: Self-pay

## 2022-11-12 MED ORDER — ONDANSETRON 4 MG PO TBDP
8.0000 mg | ORAL_TABLET | Freq: Three times a day (TID) | ORAL | 2 refills | Status: DC | PRN
  Filled 2022-11-12 – 2022-11-27 (×2): qty 20, 4d supply, fill #0

## 2022-11-13 NOTE — Assessment & Plan Note (Signed)
[id number] with peritoneal metastasis. 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   -she started first line chemo FLOT on 1/10 -She understands that chemotherapy is palliative, to prolong her life.  We are unlikely going to cure her cancer. -PD-L1 0-1%, no significant benefit from PD-L1 immunotherapy, FO revealed FGFR2 amplification and fusion (+), FGFR inhibitors can be considered in future, no other targeted therapy available  -She has been tolerating chemo very well, will continue for now  -PET scan from 05/30/2022 was negative for primary tumor or metastatic disease, the known peritoneal mets did not show on PET.  -she has been tolerating chemo well overall. Due to fatigue, I have changed her chemo from FLOT to FOLFOX on 06/20/2022, she tolerated well -she previously asked the role of surgery, depends on her next restaging CT scan findings, I may refer her to [facility] or [facility] to discuss HIPEC surgery  -due to her infusion reaction to oxaliplatin on C5, we added additional premeds and gave slow infusion over 4 hours for cycle 6 and she tolerated well  -She is not able to return to work due to the cancer and treatment related symptoms.  -She is tolerating FOLFOX well overall, with moderate fatigue for a few days after infusion but able to recover well.  No signs of neuropathy at this point. -Restaging CT abdomen pelvis from August 27, 2022 showed no residual disease.  -We again discussed maintenance therapy with Xeloda down the road, we will stop oxaliplatin when she develops side effects especially neuropathy, or after next scan -will repeat CT in 2 weeks, I encourage her to call radiology to schedule

## 2022-11-13 NOTE — Progress Notes (Unsigned)
[facility]   Telephone:(336) [phone] Fax:(336) [phone]   Clinic Follow up Note   Patient Care Team: [name], MD as PCP - General (Internal Medicine) [name], MD as Consulting Physician (Oncology)  Date of Service:  11/14/2022  CHIEF COMPLAINT: f/u of Gastric Cancer   CURRENT THERAPY:  FOLFOX q14d X 12 cycles  ASSESSMENT:  Sheryl [name] is a 57 y.o. 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   -she started first line chemo FLOT on 1/10 -She understands that chemotherapy is palliative, to prolong her life.  We are unlikely going to cure her cancer. -PD-L1 0-1%, no significant benefit from PD-L1 immunotherapy, FO revealed FGFR2 amplification and fusion (+), FGFR inhibitors can be considered in future, no other targeted therapy available  -She has been tolerating chemo very well, will continue for now  -PET scan from 05/30/2022 was negative for primary tumor or metastatic disease, the known peritoneal mets did not show on PET.  -she has been tolerating chemo well overall. Due to fatigue, I have changed her chemo from FLOT to FOLFOX on 06/20/2022, she tolerated well -she previously asked the role of surgery, depends on her next restaging CT scan findings, I may refer her to [facility] or [facility] to discuss HIPEC surgery  -due to her infusion reaction to oxaliplatin on C5, we added additional premeds and gave slow infusion over 4 hours for cycle 6 and she tolerated well  -She is not able to return to work due to the cancer and treatment related symptoms.  -She is tolerating FOLFOX well overall, with moderate fatigue for a few days after infusion but able to recover well.  No signs of neuropathy at this point. -Restaging CT abdomen pelvis  from August 27, 2022 showed no residual disease.  -Due to worsening neuropathy, I will stop oxaliplatin today, and changed to Xeloda from next cycle. -will repeat CT in 2 weeks, I encourage her to call radiology to schedule      PLAN: -lab reviewed -stop the Oxaliplatin cont. 5-FU pump today. Will cancel some premeds and cancel GCSF on day 3  - I order CT scan in 2 weeks -I prescribe Xeloda 3 tablet in the morning and 4 in the evening 2 weeks on and 1 week off.  She was started in about 2 weeks after her next visit. -lab/flush and f/u in 2 weeks  SUMMARY OF ONCOLOGIC HISTORY: Oncology History Overview Note   Cancer Staging  Gastric cancer [facility]) Staging form: Stomach, AJCC 8th Edition - Clinical stage from 04/19/2022: Stage IVB (cT2, cN0, pM1) - Signed by [name], MD on 05/08/2022 Total positive nodes: 0     Gastric cancer (HCC)  03/30/2022 Procedure   EGD:  Impression:  - Normal esophagus. - A few gastric polyps. Biopsied. - Gastritis. Biopsied. - Non-bleeding gastric ulcer with no stigmata of bleeding. Biopsied. - Normal examined duodenum. Biopsied.  Findings: Diffuse moderate inflammation characterized by congestion (edema), friability and granularity was found in the cardia, in the gastric fundus and in the gastric body. There were associated erosions in multiple places. Biopsies were taken from the antrum, body, and fundus with a cold forceps for histology. Estimated blood loss was minimal.  One non-bleeding cratered gastric ulcer with no stigmata of bleeding was found on the greater curvature  of the stomach. The lesion was 6 mm in largest dimension. The mucosa around the ulcer was heaped and led to some deformity in the antrum. Biopsies were taken with a cold forceps for histology. Estimated blood loss was minimal.    03/30/2022 Pathology Results   Patient: LORANN, TANI  Accession: [id number]  Diagnosis 1. Surgical [P], duodenal - BENIGN SMALL BOWEL MUCOSA  WITH NO SIGNIFICANT PATHOLOGIC CHANGES 2. Surgical [P], gastric antrum - GASTRIC ANTRAL MUCOSA WITH FEATURES OF REACTIVE GASTROPATHY - NEGATIVE FOR H. PYLORI ON H&E STAIN - NEGATIVE FOR INTESTINAL METAPLASIA OR MALIGNANCY 3. Surgical [P], gastric body - GASTRIC OXYNTIC MUCOSA WITH REACTIVE/REPARATIVE CHANGES - NEGATIVE FOR H. PYLORI ON H&E STAIN - NEGATIVE FOR INTESTINAL METAPLASIA, DYSPLASIA OR MALIGNANCY 4. Surgical [P], greater curve ulceration - ADENOCARCINOMA WITH SIGNET RING CELL FEATURES (SEE NOTE) 5. Surgical [P], gastric polyps - ADENOCARCINOMA WITH SIGNET RING CELL FEATURES (SEE NOTE) 6. Surgical [P], fundus (gastric) - ADENOCARCINOMA WITH SIGNET RING CELL FEATURES (SEE NOTE) 7. Surgical [P], colon, ascending, polyp (1) - TUBULAR ADENOMA. - NO HIGH GRADE DYSPLASIA OR MALIGNANCY. 8. Surgical [P], colon, transverse, polyp (1) - TUBULAR ADENOMA. - NO HIGH GRADE DYSPLASIA OR MALIGNANCY.    04/13/2022 Initial Diagnosis   Gastric cancer (HCC)   04/19/2022 Cancer Staging   Staging form: Stomach, AJCC 8th Edition - Clinical stage from 04/19/2022: Stage IVB (cT2, cN0, pM1) - Signed by [name], MD on 05/08/2022 Total positive nodes: 0   05/05/2022 Genetic Testing   Negative genetic testing on the Multi-cancer gene panel + RNA.  FH c.259C>T VUS identified.  The report date is May 05, 2022.  The Multi-Cancer + RNA Panel offered by [facility] includes sequencing and/or deletion/duplication analysis of the following 70 genes:  AIP*, ALK, APC*, ATM*, AXIN2*, BAP1*, BARD1*, BLM*, BMPR1A*, BRCA1*, BRCA2*, BRIP1*, CDC73*, CDH1*, CDK4, CDKN1B*, CDKN2A, CHEK2*, CTNNA1*, DICER1*, EPCAM (del/dup only), EGFR, FH*, FLCN*, GREM1 (promoter dup only), HOXB13, KIT, LZTR1, MAX*, MBD4, MEN1*, MET, MITF, MLH1*, MSH2*, MSH3*, MSH6*, MUTYH*, NF1*, NF2*, NTHL1*, PALB2*, PDGFRA, PMS2*, POLD1*, POLE*, POT1*, PRKAR1A*, PTCH1*, PTEN*, RAD51C*, RAD51D*, RB1*, RET, SDHA* (sequencing only), SDHAF2*, SDHB*, SDHC*,  SDHD*, SMAD4*, SMARCA4*, SMARCB1*, SMARCE1*, STK11*, SUFU*, TMEM127*, TP53*, TSC1*, TSC2*, VHL*. RNA analysis is performed for * genes.    05/09/2022 - 06/07/2022 Chemotherapy   Patient is on Treatment Plan : GASTROESOPHAGEAL FLOT q14d X 4 cycles      Miscellaneous   Foundation One  Biomarker Findings Microsatellite status- Cannot be determined Tumor Mutational Burden- Cannot be determined  Genomic Findings  FGFR2 amplification,FGFR2-TACC2 fusion,  Rearrangement intron 17 ARAF amplification CCND3 amplification TP53 [id number]*74     05/30/2022 Imaging    IMPRESSION: 1. Mild hypermetabolism corresponding to a dominant left upper quadrant mass and smaller perigastric nodules or nodes. 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 Recommend attention to the dominant left upper quadrant soft tissue mass on follow-up exams to exclude unlikely recurrent lymphoma. 2. No gastric hypermetabolism and no typical findings of metastatic disease.   06/20/2022 -  Chemotherapy   Patient is on Treatment Plan : GASTRIC FOLFOX q14d x 12 cycles     08/27/2022 Imaging    IMPRESSION: No focal gastric mass on CT.   No findings suspicious for recurrent or metastatic disease.   Stable left upper abdominal soft tissue lesion and small lymph nodes, chronic, favoring treated lymphoma.      INTERVAL HISTORY:  Sheryl [name] is here for a follow up of  Gastric Cancer. She was last seen by me on 10/31/2022. She presents to the clinic accompanied by father. Pt state that she had some vomiting a day after having treatment. Pt state that the bottom of her feet are numb      All other systems were reviewed with the patient and are negative.  MEDICAL HISTORY:  Past Medical History:  Diagnosis Date   Blood transfusion without reported diagnosis    had transfusion with hysterectomy   Cataract    Colon polyps 2012   Diabetes (HCC) 03/13/2021   Diabetes (HCC) 05/21/2019    Family history of breast cancer    Family history of pancreatic cancer    Family history of stomach cancer    Fibroid    GERD (gastroesophageal reflux disease)    H/O blood clots    History of hysterectomy    fibroids and heavy cycles   Hypertension     SURGICAL HISTORY: Past Surgical History:  Procedure Laterality Date   ABDOMINAL HYSTERECTOMY     BIOPSY  04/19/2022   Procedure: BIOPSY;  Surgeon: Lemar Lofty., MD;  Location: [facility];  Service: Gastroenterology;;   COLONOSCOPY     ESOPHAGOGASTRODUODENOSCOPY (EGD) WITH PROPOFOL N/A 04/19/2022   Procedure: ESOPHAGOGASTRODUODENOSCOPY (EGD) WITH PROPOFOL;  Surgeon: Lemar Lofty., MD;  Location: [facility];  Service: Gastroenterology;  Laterality: N/A;   EUS N/A 04/19/2022   Procedure: UPPER ENDOSCOPIC ULTRASOUND (EUS) RADIAL;  Surgeon: Lemar Lofty., MD;  Location: [facility];  Service: Gastroenterology;  Laterality: N/A;   EXCISION OF SKIN TAG  05/03/2022   Procedure: EXCISION OF CHEST WALL SKIN LESION;  Surgeon: Fritzi Mandes, MD;  Location: [facility];  Service: General;;   LAPAROSCOPY N/A 05/03/2022   Procedure: LAPAROSCOPY DIAGNOSTIC WITH PERITONEAL WASHINGS;  Surgeon: Fritzi Mandes, MD;  Location: [facility] OR;  Service: General;  Laterality: N/A;   POLYPECTOMY  04/19/2022   Procedure: POLYPECTOMY;  Surgeon: Lemar Lofty., MD;  Location: [facility];  Service: Gastroenterology;;   PORTACATH PLACEMENT N/A 05/03/2022   Procedure: INSERTION PORT-A-CATH WITH ULTRASOUND GUIDANCE;  Surgeon: Fritzi Mandes, MD;  Location: [facility] OR;  Service: General;  Laterality: N/A;   UPPER GASTROINTESTINAL ENDOSCOPY      I have reviewed the social history and family history with the patient and they are unchanged from previous note.  ALLERGIES:  is allergic to aspirin, cyclobenzaprine, naproxen sodium, zithromax [azithromycin dihydrate], oxaliplatin, and dilaudid [hydromorphone].  MEDICATIONS:  Current  Outpatient Medications  Medication Sig Dispense Refill   capecitabine (XELODA) 500 MG tablet Take 3 tabs in morning and 4 tabs in evening, every 12 hours, take after meals, take for 14 days then off for 7 days 98 tablet 0   acetaminophen (TYLENOL) 500 MG tablet Take 2 tablets (1,000 mg total) by mouth every 8 (eight) hours as needed (pain). 30 tablet 1   amLODipine (NORVASC) 10 MG tablet Take 1 tablet (10 mg total) by mouth daily. 90 tablet 1   Bacillus Coagulans-Inulin (PROBIOTIC-PREBIOTIC) 1-250 BILLION-MG CAPS Take 2 capsules by mouth daily. 30 capsule 1   Biotin 1000 MCG CHEW Chew 1,000 mcg by mouth daily. 30 tablet 2   Blood Glucose Monitoring Suppl (TRUE METRIX METER) w/Device KIT 1 kit by Does not apply route 3 (three) times daily as needed. 1 kit 0   Cholecalciferol (VITAMIN D3) 125 MCG (5000 UT) TABS Take 1 tablet (5,000 Units total) by mouth daily. 30 tablet 2   diphenhydrAMINE-prednisoLONE-nystatin in lidocaine solution Take 5  mls by mouth 3 (three) times daily as needed for mouth pain. 140 mL 0   lidocaine-prilocaine (EMLA) cream Apply 1 Application topically as needed. 30 g 1   ondansetron (ZOFRAN-ODT) 4 MG disintegrating tablet Take 2 tablets (8 mg total) by mouth every 8 (eight) hours as needed for nausea or vomiting. Dissolve one tab on tongue 30 minutes before each dose of bowel prep 20 tablet 2   pantoprazole (PROTONIX) 40 MG tablet Take 1 tablet (40 mg total) by mouth 2 (two) times daily. 180 tablet 0   potassium chloride SA (KLOR-CON M) 20 MEQ tablet Take 1 tablet (20 mEq total) by mouth 2 (two) times daily. Change to once daily after 5 days, until finish 30 tablet 0   prochlorperazine (COMPAZINE) 10 MG tablet Take 1 tablet (10 mg total) by mouth every 6 (six) hours as needed for nausea or vomiting. 30 tablet 1   rosuvastatin (CRESTOR) 5 MG tablet Take 1 tablet (5 mg total) by mouth daily. (Patient not taking: Reported on 04/18/2022) 90 tablet 1   sucralfate (CARAFATE) 1 g  tablet Take 1 tablet (1 g total) by mouth 2 (two) times daily. 60 tablet 6   Current Facility-Administered Medications  Medication Dose Route Frequency Provider Last Rate Last Admin   0.9 %  sodium chloride infusion  500 mL Intravenous Continuous Tressia Danas, MD       Facility-Administered Medications Ordered in Other Visits  Medication Dose Route Frequency Provider Last Rate Last Admin   fluorouracil (ADRUCIL) 5,000 mg in sodium chloride 0.9 % 150 mL chemo infusion  2,400 mg/m2 (Treatment Plan Recorded) Intravenous 1 day or 1 dose [name], MD   Infusion Verify at 11/14/22 1126   sodium chloride flush (NS) 0.9 % injection 10 mL  10 mL Intracatheter PRN [name], MD        PHYSICAL EXAMINATION: ECOG PERFORMANCE STATUS: 1 - Symptomatic but completely ambulatory  Vitals:   11/14/22 0922  BP: 124/88  Pulse: 68  Resp: 18  Temp: 98.4 F (36.9 C)  SpO2: 100%   Wt Readings from Last 3 Encounters:  11/14/22 188 lb 1.6 oz (85.3 kg)  10/31/22 192 lb 8 oz (87.3 kg)  10/17/22 191 lb 9.6 oz (86.9 kg)     GENERAL:alert, no distress and comfortable SKIN: skin color normal, no rashes or significant lesions EYES: normal, Conjunctiva are pink and non-injected, sclera clear  NEURO: alert & oriented x 3 with fluent speech   LABORATORY DATA:  I have reviewed the data as listed    Latest Ref Rng & Units 11/14/2022    9:00 AM 10/31/2022    8:24 AM 10/17/2022    8:55 AM  CBC  WBC 4.0 - 10.5 K/uL 5.5  5.1  7.9   Hemoglobin 12.0 - 15.0 g/dL 29.5  62.1  30.8   Hematocrit 36.0 - 46.0 % 39.7  38.5  39.8   Platelets 150 - 400 K/uL 236  260  251         Latest Ref Rng & Units 11/14/2022    9:00 AM 10/31/2022    8:24 AM 10/17/2022    8:55 AM  CMP  Glucose 70 - 99 mg/dL 89  657  846   BUN 6 - 20 mg/dL 12  9  9    Creatinine 0.44 - 1.00 mg/dL 9.62  9.52  8.41   Sodium 135 - 145 mmol/L 140  140  139   Potassium 3.5 - 5.1 mmol/L 3.6  3.5  3.8   Chloride 98 - 111 mmol/L 104  105  105   CO2  22 - 32 mmol/L 28  28  27    Calcium 8.9 - 10.3 mg/dL 9.7  9.2  9.4   Total Protein 6.5 - 8.1 g/dL 7.5  6.6  7.0   Total Bilirubin 0.3 - 1.2 mg/dL 0.8  0.5  0.6   Alkaline Phos 38 - 126 U/L 131  129  121   AST 15 - 41 U/L 26  28  39   ALT 0 - 44 U/L 19  20  27        RADIOGRAPHIC STUDIES: I have personally reviewed the radiological images as listed and agreed with the findings in the report. No results found.    No orders of the defined types were placed in this encounter.  All questions were answered. The patient knows to call the clinic with any problems, questions or concerns. No barriers to learning was detected. The total time spent in the appointment was 30 minutes.     [name], MD 11/14/2022   Carolin Coy, CMA, am acting as scribe for [name], MD.   I have reviewed the above documentation for accuracy and completeness, and I agree with the above.

## 2022-11-14 ENCOUNTER — Inpatient Hospital Stay: Payer: Medicaid Other

## 2022-11-14 ENCOUNTER — Inpatient Hospital Stay: Payer: Medicaid Other | Admitting: Hematology

## 2022-11-14 ENCOUNTER — Encounter: Payer: Self-pay | Admitting: Hematology

## 2022-11-14 ENCOUNTER — Other Ambulatory Visit: Payer: Self-pay

## 2022-11-14 ENCOUNTER — Other Ambulatory Visit (HOSPITAL_COMMUNITY): Payer: Self-pay

## 2022-11-14 ENCOUNTER — Telehealth: Payer: Self-pay | Admitting: Pharmacist

## 2022-11-14 ENCOUNTER — Telehealth: Payer: Self-pay | Admitting: Pharmacy Technician

## 2022-11-14 VITALS — BP 140/85 | HR 62

## 2022-11-14 VITALS — BP 124/88 | HR 68 | Temp 98.4°F | Resp 18 | Ht 68.0 in | Wt 188.1 lb

## 2022-11-14 DIAGNOSIS — C162 Malignant neoplasm of body of stomach: Secondary | ICD-10-CM

## 2022-11-14 DIAGNOSIS — D500 Iron deficiency anemia secondary to blood loss (chronic): Secondary | ICD-10-CM

## 2022-11-14 DIAGNOSIS — Z95828 Presence of other vascular implants and grafts: Secondary | ICD-10-CM

## 2022-11-14 DIAGNOSIS — Z5111 Encounter for antineoplastic chemotherapy: Secondary | ICD-10-CM | POA: Diagnosis not present

## 2022-11-14 LAB — CMP (CANCER CENTER ONLY)
ALT: 19 U/L (ref 0–44)
AST: 26 U/L (ref 15–41)
Albumin: 4.4 g/dL (ref 3.5–5.0)
Alkaline Phosphatase: 131 U/L — ABNORMAL HIGH (ref 38–126)
Anion gap: 8 (ref 5–15)
BUN: 12 mg/dL (ref 6–20)
CO2: 28 mmol/L (ref 22–32)
Calcium: 9.7 mg/dL (ref 8.9–10.3)
Chloride: 104 mmol/L (ref 98–111)
Creatinine: 0.68 mg/dL (ref 0.44–1.00)
GFR, Estimated: >60 mL/min (ref >60)
Glucose, Bld: 89 mg/dL (ref 70–99)
Potassium: 3.6 mmol/L (ref 3.5–5.1)
Sodium: 140 mmol/L (ref 135–145)
Total Bilirubin: 0.8 mg/dL (ref 0.3–1.2)
Total Protein: 7.5 g/dL (ref 6.5–8.1)

## 2022-11-14 LAB — CBC WITH DIFFERENTIAL (CANCER CENTER ONLY)
Abs Immature Granulocytes: 0.03 10*3/uL (ref 0.00–0.07)
Basophils Absolute: 0 10*3/uL (ref 0.0–0.1)
Basophils Relative: 1 %
Eosinophils Absolute: 0.1 10*3/uL (ref 0.0–0.5)
Eosinophils Relative: 1 %
HCT: 39.7 % (ref 36.0–46.0)
Hemoglobin: 13.9 g/dL (ref 12.0–15.0)
Immature Granulocytes: 1 %
Lymphocytes Relative: 17 %
Lymphs Abs: 0.9 10*3/uL (ref 0.7–4.0)
MCH: 31 pg (ref 26.0–34.0)
MCHC: 35 g/dL (ref 30.0–36.0)
MCV: 88.6 fL (ref 80.0–100.0)
Monocytes Absolute: 0.7 10*3/uL (ref 0.1–1.0)
Monocytes Relative: 13 %
Neutro Abs: 3.7 10*3/uL (ref 1.7–7.7)
Neutrophils Relative %: 67 %
Platelet Count: 236 10*3/uL (ref 150–400)
RBC: 4.48 MIL/uL (ref 3.87–5.11)
RDW: 15.3 % (ref 11.5–15.5)
WBC Count: 5.5 10*3/uL (ref 4.0–10.5)
nRBC: 0 % (ref 0.0–0.2)

## 2022-11-14 LAB — FERRITIN: Ferritin: 153 ng/mL (ref 11–307)

## 2022-11-14 MED ORDER — SODIUM CHLORIDE 0.9 % IV SOLN
2400.0000 mg/m2 | INTRAVENOUS | Status: DC
  Administered 2022-11-14: 5000 mg via INTRAVENOUS
  Filled 2022-11-14: qty 100

## 2022-11-14 MED ORDER — LEUCOVORIN CALCIUM INJECTION 350 MG
400.0000 mg/m2 | Freq: Once | INTRAVENOUS | Status: AC
  Administered 2022-11-14: 788 mg via INTRAVENOUS
  Filled 2022-11-14: qty 17.5

## 2022-11-14 MED ORDER — PALONOSETRON HCL INJECTION 0.25 MG/5ML
0.2500 mg | Freq: Once | INTRAVENOUS | Status: AC
  Administered 2022-11-14: 0.25 mg via INTRAVENOUS
  Filled 2022-11-14: qty 5

## 2022-11-14 MED ORDER — DEXTROSE 5 % IV SOLN: Freq: Once | INTRAVENOUS | Status: AC

## 2022-11-14 MED ORDER — SODIUM CHLORIDE 0.9% FLUSH
10.0000 mL | Freq: Once | INTRAVENOUS | Status: AC
  Administered 2022-11-14: 10 mL

## 2022-11-14 MED ORDER — SODIUM CHLORIDE 0.9% FLUSH: 10.0000 mL | INTRAVENOUS | Status: DC | PRN

## 2022-11-14 MED ORDER — CAPECITABINE 500 MG PO TABS
ORAL_TABLET | ORAL | 0 refills | Status: DC
  Filled 2022-11-14: qty 98, fill #0

## 2022-11-14 MED ORDER — SODIUM CHLORIDE 0.9 % IV SOLN
10.0000 mg | Freq: Once | INTRAVENOUS | Status: AC
  Administered 2022-11-14: 10 mg via INTRAVENOUS
  Filled 2022-11-14: qty 10

## 2022-11-14 NOTE — Patient Instructions (Signed)
[place] [facility]  Discharge Instructions: Thank you for choosing [facility] to provide your oncology and hematology care.   If you have a lab appointment with the Cancer Center, please go directly to the Cancer Center and check in at the registration area.   Wear comfortable clothing and clothing appropriate for easy access to any Portacath or PICC line.   We strive to give you quality time with your provider. You may need to reschedule your appointment if you arrive late (15 or more minutes).  Arriving late affects you and other patients whose appointments are after yours.  Also, if you miss three or more appointments without notifying the office, you may be dismissed from the clinic at the provider's discretion.      For prescription refill requests, have your pharmacy contact our office and allow 72 hours for refills to be completed.    Today you received the following chemotherapy and/or immunotherapy agents: Leucovorin, 5FU      To help prevent nausea and vomiting after your treatment, we encourage you to take your nausea medication as directed.  BELOW ARE SYMPTOMS THAT SHOULD BE REPORTED IMMEDIATELY: *FEVER GREATER THAN 100.4 F (38 C) OR HIGHER *CHILLS OR SWEATING *NAUSEA AND VOMITING THAT IS NOT CONTROLLED WITH YOUR NAUSEA MEDICATION *UNUSUAL SHORTNESS OF BREATH *UNUSUAL BRUISING OR BLEEDING *URINARY PROBLEMS (pain or burning when urinating, or frequent urination) *BOWEL PROBLEMS (unusual diarrhea, constipation, pain near the anus) TENDERNESS IN MOUTH AND THROAT WITH OR WITHOUT PRESENCE OF ULCERS (sore throat, sores in mouth, or a toothache) UNUSUAL RASH, SWELLING OR PAIN  UNUSUAL VAGINAL DISCHARGE OR ITCHING   Items with * indicate a potential emergency and should be followed up as soon as possible or go to the Emergency Department if any problems should occur.  Please show the CHEMOTHERAPY ALERT CARD or IMMUNOTHERAPY ALERT CARD at  check-in to the Emergency Department and triage nurse.  Should you have questions after your visit or need to cancel or reschedule your appointment, please contact [place] [facility]  Dept: [phone]  and follow the prompts.  Office hours are 8:00 a.m. to 4:30 p.m. Monday - Friday. Please note that voicemails left after 4:00 p.m. may not be returned until the following business day.  We are closed weekends and major holidays. You have access to a nurse at all times for urgent questions. Please call the main number to the clinic Dept: [phone] and follow the prompts.   For any non-urgent questions, you may also contact your provider using MyChart. We now offer e-Visits for anyone 16 and older to request care online for non-urgent symptoms. For details visit [email or web address].   Also download the MyChart app! Go to the app store, search "MyChart", open the app, select [place], and log in with your MyChart username and password.

## 2022-11-14 NOTE — Progress Notes (Signed)
Per Dr. Mosetta Putt, patient instructed that she does not need to take Claritin prior to pump discontinue appt bc she will not be receiving GCSF injection.  Patient verbalized understanding.

## 2022-11-14 NOTE — Telephone Encounter (Signed)
Oral Oncology Patient Advocate Encounter  After completing a benefits investigation, prior authorization for capecitabine is not required at this time through [facility].  Patient's copay is $4.     Jinger Neighbors, CPhT-Adv Oncology Pharmacy Patient Advocate [facility] Direct Number: [phone]  Fax: [phone]

## 2022-11-14 NOTE — Telephone Encounter (Signed)
Oral Oncology Pharmacist Encounter  Received new prescription for Xeloda (capecitabine) for the maintenance treatment of metastatic gastric cancer, planned duration until disease progression or unacceptable drug toxicity. Of note, patient receiving 5-FU on 11/14/22, thus Xeloda will not be started until the earliest 11/28/22.  CBC w/ Diff and CMP from 11/14/22 assessed, no relevant lab abnormalities requiring baseline dose adjustment required at this time. Prescription dose and frequency assessed for appropriateness. Dosing per MD discretion.   Current medication list in Epic reviewed, DDIs with Xeloda identified: Category C DDI between Xeloda and Pantoprazole - proton-pump inhibitors can decrease efficacy of Xeloda - will discuss with patient alternatives to pantoprazole, such as H2RA's like famotidine while on Xeloda. Category C DDI between Xeloda and Ondansetron due to risk of Qtc prolongation with fluorouracil products. Noted patient only taking PRN and PO route, risk higher with IV administration. No change in therapy warranted at this time.   Evaluated chart and no patient barriers to medication adherence noted.   Patient agreement for treatment documented in MD note on 11/14/22.  Prescription has been e-scribed to the [facility] for benefits analysis and approval.  Oral Oncology Clinic will continue to follow for insurance authorization, copayment issues, initial counseling and start date.  Lenord Carbo, PharmD, BCPS, BCOP Hematology/Oncology Clinical Pharmacist [facility] Oral Chemotherapy Navigation Clinics [phone] 11/14/2022 10:09 AM

## 2022-11-15 ENCOUNTER — Other Ambulatory Visit: Payer: Self-pay

## 2022-11-15 ENCOUNTER — Other Ambulatory Visit: Payer: Self-pay | Admitting: Hematology

## 2022-11-15 DIAGNOSIS — C162 Malignant neoplasm of body of stomach: Secondary | ICD-10-CM

## 2022-11-15 MED ORDER — PROCHLORPERAZINE MALEATE 10 MG PO TABS
10.0000 mg | ORAL_TABLET | Freq: Four times a day (QID) | ORAL | 1 refills | Status: DC | PRN
  Filled 2022-11-15 – 2022-11-27 (×2): qty 30, 8d supply, fill #0

## 2022-11-16 ENCOUNTER — Inpatient Hospital Stay: Payer: Medicaid Other

## 2022-11-16 ENCOUNTER — Other Ambulatory Visit: Payer: Self-pay

## 2022-11-16 VITALS — BP 132/93 | HR 73 | Temp 98.2°F | Resp 17

## 2022-11-16 DIAGNOSIS — C162 Malignant neoplasm of body of stomach: Secondary | ICD-10-CM

## 2022-11-16 DIAGNOSIS — Z5111 Encounter for antineoplastic chemotherapy: Secondary | ICD-10-CM | POA: Diagnosis not present

## 2022-11-16 MED ORDER — HEPARIN SOD (PORK) LOCK FLUSH 100 UNIT/ML IV SOLN
500.0000 [IU] | Freq: Once | INTRAVENOUS | Status: AC | PRN
  Administered 2022-11-16: 500 [IU]

## 2022-11-16 MED ORDER — SODIUM CHLORIDE 0.9% FLUSH
10.0000 mL | INTRAVENOUS | Status: DC | PRN
  Administered 2022-11-16: 10 mL

## 2022-11-22 ENCOUNTER — Other Ambulatory Visit: Payer: Self-pay

## 2022-11-25 ENCOUNTER — Other Ambulatory Visit: Payer: Self-pay

## 2022-11-26 ENCOUNTER — Other Ambulatory Visit: Payer: Self-pay

## 2022-11-26 ENCOUNTER — Other Ambulatory Visit (HOSPITAL_COMMUNITY): Payer: Self-pay

## 2022-11-26 MED ORDER — CAPECITABINE 500 MG PO TABS
ORAL_TABLET | ORAL | 0 refills | Status: DC
Start: 2022-11-26 — End: 2022-12-13
  Filled 2022-11-26: qty 98, 21d supply, fill #0
  Filled 2022-11-26: qty 98, fill #0

## 2022-11-26 NOTE — Telephone Encounter (Signed)
Oral Chemotherapy Pharmacist Encounter  I spoke with patient for overview of: Xeloda (capecitabine) for the maintenance treatment of metastatic gastric cancer, planned duration until disease progression or unacceptable drug toxicity.  Counseled patient on administration, dosing, side effects, monitoring, drug-food interactions, safe handling, storage, and disposal.  Patient will take Xeloda 500mg  tablets, 3 tablets (1500mg ) by mouth in AM and 4 tabs (2000mg ) by mouth in PM, within 30 minutes of finishing meals, for 14 days on, 7 days off, repeated every 21 days.  Xeloda start date: pending - patient knows not to start until directed by Dr. Mosetta Putt after office visit on 11/28/22  Adverse effects include but are not limited to: fatigue, decreased blood counts, GI upset, diarrhea, mouth sores, and hand-foot syndrome. Hand-foot syndrome: discussed use of cream such as [place] Smooth Extra Care 20 or equivalent advanced care cream that has 20% urea content for advanced skin hydration while on Xeloda Diarrhea: Patient will obtain Imodium (loperamide) to have on hand if they experience diarrhea. Patient knows to alert the office of 4 or more loose stools above baseline.  Reviewed with patient importance of keeping a medication schedule and plan for any missed doses. No barriers to medication adherence identified.  Medication reconciliation performed and medication/allergy list updated. Discussed in detail drug-drug interaction between pantoprazole and Xeloda and that PPIs such as pantoprazole can decrease Xeloda efficacy. Patient willing to try Pepcid (famotidine) BID while on Xeloda and will ask MD for Rx to see if her insurance will cover this at appt on 7/31.   Patient will pick this up from the [facility] outpatient pharmacy on 11/28/22.  Patient informed the pharmacy will reach out 5-7 days prior to needing next fill of Xeloda to coordinate continued medication acquisition to prevent break in  therapy.  All questions answered.  Sheryl [name] voiced understanding and appreciation.   Medication education handout placed in mail for patient. Patient knows to call the office with questions or concerns. Oral Chemotherapy Clinic phone number provided to patient.   Lenord Carbo, PharmD, BCPS, BCOP Hematology/Oncology Clinical Pharmacist [facility] Oral Chemotherapy Navigation Clinics [phone] 11/26/2022 10:25 AM

## 2022-11-27 ENCOUNTER — Other Ambulatory Visit: Payer: Self-pay | Admitting: Gastroenterology

## 2022-11-27 ENCOUNTER — Other Ambulatory Visit: Payer: Self-pay

## 2022-11-27 ENCOUNTER — Encounter (HOSPITAL_COMMUNITY): Payer: Self-pay

## 2022-11-27 ENCOUNTER — Ambulatory Visit (HOSPITAL_COMMUNITY)
Admission: RE | Admit: 2022-11-27 | Discharge: 2022-11-27 | Disposition: A | Discharge status: Home / Self Care | Payer: Medicaid Other | Source: Ambulatory Visit | Attending: Hematology | Admitting: Hematology

## 2022-11-27 DIAGNOSIS — C162 Malignant neoplasm of body of stomach: Secondary | ICD-10-CM | POA: Diagnosis present

## 2022-11-27 MED ORDER — SODIUM CHLORIDE (PF) 0.9 % IJ SOLN
INTRAMUSCULAR | Status: AC
  Filled 2022-11-27: qty 50

## 2022-11-27 MED ORDER — HEPARIN SOD (PORK) LOCK FLUSH 100 UNIT/ML IV SOLN
INTRAVENOUS | Status: AC
  Filled 2022-11-27: qty 5

## 2022-11-27 MED ORDER — IOHEXOL 300 MG/ML  SOLN
75.0000 mL | Freq: Once | INTRAMUSCULAR | Status: AC | PRN
  Administered 2022-11-27: 75 mL via INTRAVENOUS

## 2022-11-27 MED ORDER — SUCRALFATE 1 G PO TABS
1.0000 g | ORAL_TABLET | Freq: Two times a day (BID) | ORAL | 1 refills | Status: DC
  Filled 2022-11-27: qty 60, 30d supply, fill #0
  Filled 2022-12-27: qty 60, 30d supply, fill #1

## 2022-11-27 MED ORDER — HEPARIN SOD (PORK) LOCK FLUSH 100 UNIT/ML IV SOLN
500.0000 [IU] | Freq: Once | INTRAVENOUS | Status: AC
  Administered 2022-11-27: 500 [IU] via INTRAVENOUS

## 2022-11-27 NOTE — Assessment & Plan Note (Signed)
[id number] with peritoneal metastasis. 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   -she started first line chemo FLOT on 1/10 -She understands that chemotherapy is palliative, to prolong her life.  We are unlikely going to cure her cancer. -PD-L1 0-1%, no significant benefit from PD-L1 immunotherapy, FO revealed FGFR2 amplification and fusion (+), FGFR inhibitors can be considered in future, no other targeted therapy available  -She has been tolerating chemo very well, will continue for now  -PET scan from 05/30/2022 was negative for primary tumor or metastatic disease, the known peritoneal mets did not show on PET.  -she has been tolerating chemo well overall. Due to fatigue, I have changed her chemo from FLOT to FOLFOX on 06/20/2022, she tolerated well -she previously asked the role of surgery, depends on her next restaging CT scan findings, I may refer her to [facility] or [facility] to discuss HIPEC surgery  -due to her infusion reaction to oxaliplatin on C5, we added additional premeds and gave slow infusion over 4 hours for cycle 6 and she tolerated well  -She is not able to return to work due to the cancer and treatment related symptoms.  -She is tolerating FOLFOX well overall, with moderate fatigue for a few days after infusion but able to recover well.  No signs of neuropathy at this point. -Restaging CT abdomen pelvis from August 27, 2022 showed no residual disease.  -We again discussed maintenance therapy with Xeloda down the road, we will stop oxaliplatin when she develops side effects especially neuropathy, or after next scan -repeated staging CT from 11/26/2021 showed stable disease  -we discussed changing chemo to maintenance Xeloda or 5-fu pump infusion

## 2022-11-27 NOTE — Progress Notes (Signed)
Sheryl [name]   Telephone:([phone]) [phone] Fax:([phone]) [phone]   Clinic Follow up Note   Patient Care Team: [name], MD as PCP - General (Internal Medicine) [name], MD as Consulting Physician (Oncology)  Date of Service:  11/28/2022  CHIEF COMPLAINT: f/u of Gastric Cancer   CURRENT THERAPY:  Xeloda starting 8/5 /2024. 3 tablets in the AM and 4 tablets in the evening 2 wks on and 1 wk off.  ASSESSMENT:  Sheryl [name] is a 57 y.o. 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   -she started first line chemo FLOT on 1/10 -She understands that chemotherapy is palliative, to prolong her life.  We are unlikely going to cure her cancer. -PD-L1 0-1%, no significant benefit from PD-L1 immunotherapy, FO revealed FGFR2 amplification and fusion (+), FGFR inhibitors can be considered in future, no other targeted therapy available  -She has been tolerating chemo very well, will continue for now  -PET scan from 05/30/2022 was negative for primary tumor or metastatic disease, the known peritoneal mets did not show on PET.  -she has been tolerating chemo well overall. Due to fatigue, I have changed her chemo from FLOT to FOLFOX on 06/20/2022, she tolerated well -she previously asked the role of surgery, depends on her next restaging CT scan findings, I may refer her to [facility] or [name] to discuss HIPEC surgery  -due to her infusion reaction to oxaliplatin on C5, we added additional premeds and gave slow infusion over 4 hours for cycle 6 and she tolerated well  -She is not able to return to work due to the cancer and treatment related symptoms.  -She is tolerating FOLFOX well overall, with moderate fatigue for a few days after infusion but able to recover  well.  No signs of neuropathy at this point. -Restaging CT abdomen pelvis from August 27, 2022 showed no residual disease.  -We again discussed maintenance therapy with Xeloda down the road, we will stop oxaliplatin when she develops side effects especially neuropathy, or after next scan -repeated staging CT from 11/26/2021 showed stable disease, pt's sone and daughter had many questions about some incidental findings on CT and want her to have EGD and colonoscopy, I do not think that's important, will repeat CT in 3 months and f/u the thickening of esophageal and rectal walls  -we will change treatment to maintenance Xeloda, she will start next Monday      PLAN: -Reviewed CT scan w/ pt-stable -Pt will be starting Xeloda 3 tablets in the AM and 4 tablet in the evening 2 wks on and 1 wk off -repeat CT scan in 3 months -f/u  phone visit 8/9 with NP Heather    SUMMARY OF ONCOLOGIC HISTORY: Oncology History Overview Note   Cancer Staging  Gastric cancer [facility]) Staging form: Stomach, AJCC 8th Edition - Clinical stage from 04/19/2022: Stage IVB (cT2, cN0, pM1) - Signed by [name], MD on 05/08/2022 Total positive nodes: 0     Gastric cancer (HCC)  03/30/2022 Procedure   EGD:  Impression:  - Normal esophagus. - A few gastric polyps. Biopsied. - Gastritis. Biopsied. - Non-bleeding gastric ulcer with no stigmata of bleeding. Biopsied. - Normal examined duodenum. Biopsied.  Findings: Diffuse moderate inflammation characterized by congestion (edema), friability and granularity was found in the cardia,  in the gastric fundus and in the gastric body. There were associated erosions in multiple places. Biopsies were taken from the antrum, body, and fundus with a cold forceps for histology. Estimated blood loss was minimal.  One non-bleeding cratered gastric ulcer with no stigmata of bleeding was found on the greater curvature of the stomach. The lesion was 6 mm in largest dimension. The  mucosa around the ulcer was heaped and led to some deformity in the antrum. Biopsies were taken with a cold forceps for histology. Estimated blood loss was minimal.    03/30/2022 Pathology Results   Patient: SHINY, BECKLES  Accession: [id number]  Diagnosis 1. Surgical [P], duodenal - BENIGN SMALL BOWEL MUCOSA WITH NO SIGNIFICANT PATHOLOGIC CHANGES 2. Surgical [P], gastric antrum - GASTRIC ANTRAL MUCOSA WITH FEATURES OF REACTIVE GASTROPATHY - NEGATIVE FOR H. PYLORI ON H&E STAIN - NEGATIVE FOR INTESTINAL METAPLASIA OR MALIGNANCY 3. Surgical [P], gastric body - GASTRIC OXYNTIC MUCOSA WITH REACTIVE/REPARATIVE CHANGES - NEGATIVE FOR H. PYLORI ON H&E STAIN - NEGATIVE FOR INTESTINAL METAPLASIA, DYSPLASIA OR MALIGNANCY 4. Surgical [P], greater curve ulceration - ADENOCARCINOMA WITH SIGNET RING CELL FEATURES (SEE NOTE) 5. Surgical [P], gastric polyps - ADENOCARCINOMA WITH SIGNET RING CELL FEATURES (SEE NOTE) 6. Surgical [P], fundus (gastric) - ADENOCARCINOMA WITH SIGNET RING CELL FEATURES (SEE NOTE) 7. Surgical [P], colon, ascending, polyp (1) - TUBULAR ADENOMA. - NO HIGH GRADE DYSPLASIA OR MALIGNANCY. 8. Surgical [P], colon, transverse, polyp (1) - TUBULAR ADENOMA. - NO HIGH GRADE DYSPLASIA OR MALIGNANCY.    04/13/2022 Initial Diagnosis   Gastric cancer (HCC)   04/19/2022 Cancer Staging   Staging form: Stomach, AJCC 8th Edition - Clinical stage from 04/19/2022: Stage IVB (cT2, cN0, pM1) - Signed by [name], MD on 05/08/2022 Total positive nodes: 0   05/05/2022 Genetic Testing   Negative genetic testing on the Multi-cancer gene panel + RNA.  FH c.259C>T VUS identified.  The report date is May 05, 2022.  The Multi-Cancer + RNA Panel offered by [facility] includes sequencing and/or deletion/duplication analysis of the following 70 genes:  AIP*, ALK, APC*, ATM*, AXIN2*, BAP1*, BARD1*, BLM*, BMPR1A*, BRCA1*, BRCA2*, BRIP1*, CDC73*, CDH1*, CDK4, CDKN1B*, CDKN2A, CHEK2*, CTNNA1*,  DICER1*, EPCAM (del/dup only), EGFR, FH*, FLCN*, GREM1 (promoter dup only), HOXB13, KIT, LZTR1, MAX*, MBD4, MEN1*, MET, MITF, MLH1*, MSH2*, MSH3*, MSH6*, MUTYH*, NF1*, NF2*, NTHL1*, PALB2*, PDGFRA, PMS2*, POLD1*, POLE*, POT1*, PRKAR1A*, PTCH1*, PTEN*, RAD51C*, RAD51D*, RB1*, RET, SDHA* (sequencing only), SDHAF2*, SDHB*, SDHC*, SDHD*, SMAD4*, SMARCA4*, SMARCB1*, SMARCE1*, STK11*, SUFU*, TMEM127*, TP53*, TSC1*, TSC2*, VHL*. RNA analysis is performed for * genes.    05/09/2022 - 06/07/2022 Chemotherapy   Patient is on Treatment Plan : GASTROESOPHAGEAL FLOT q14d X 4 cycles      Miscellaneous   Foundation One  Biomarker Findings Microsatellite status- Cannot be determined Tumor Mutational Burden- Cannot be determined  Genomic Findings  FGFR2 amplification,FGFR2-TACC2 fusion,  Rearrangement intron 17 ARAF amplification CCND3 amplification TP53 [id number]*74     05/30/2022 Imaging    IMPRESSION: 1. Mild hypermetabolism corresponding to a dominant left upper quadrant mass and smaller perigastric nodules or nodes. 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 Recommend attention to the dominant left upper quadrant soft tissue mass on follow-up exams to exclude unlikely recurrent lymphoma. 2. No gastric hypermetabolism and no typical findings of metastatic disease.   06/20/2022 -  Chemotherapy   Patient is on Treatment Plan : GASTRIC FOLFOX q14d x 12 cycles     08/27/2022 Imaging  IMPRESSION: No focal gastric mass on CT.   No findings suspicious for recurrent or metastatic disease.   Stable left upper abdominal soft tissue lesion and small lymph nodes, chronic, favoring treated lymphoma.   11/27/2022 Imaging    IMPRESSION: 1. Questionable thickening of the distal esophagus/GE junction and gastric antrum, consider further evaluation with endoscopy. 2. Chronically stable left upper quadrant nodularity and prominent lymph nodes again favored treated  lymphoma. Continued attention on follow-up imaging suggested. 3. No convincing evidence of metastatic disease in the chest, abdomen or pelvis. 4. Questionable asymmetric wall thickening of the rectum, consider further evaluation with colonoscopy. 5. Mild wall thickening of a nondistended urinary bladder, correlate with urinalysis to exclude cystitis. 6. Hepatic steatosis.      INTERVAL HISTORY:  Sheryl [name] is here for a follow up of Gastric Cancer. She was last seen by me on 11/14/2022. She presents to the clinic accompanied by father. Pt state that she has numbness in finger and toes. She report of sometimes having trouble picking up objects. Pt is overall clinically doing well.      All other systems were reviewed with the patient and are negative.  MEDICAL HISTORY:  Past Medical History:  Diagnosis Date   Blood transfusion without reported diagnosis    had transfusion with hysterectomy   Cataract    Colon polyps 2012   Diabetes (HCC) 03/13/2021   Diabetes (HCC) 05/21/2019   Family history of breast cancer    Family history of pancreatic cancer    Family history of stomach cancer    Fibroid    gastric ca 03/2022   GERD (gastroesophageal reflux disease)    H/O blood clots    History of hysterectomy    fibroids and heavy cycles   Hypertension     SURGICAL HISTORY: Past Surgical History:  Procedure Laterality Date   ABDOMINAL HYSTERECTOMY     BIOPSY  04/19/2022   Procedure: BIOPSY;  Surgeon: Lemar Lofty., MD;  Location: [facility];  Service: Gastroenterology;;   COLONOSCOPY     ESOPHAGOGASTRODUODENOSCOPY (EGD) WITH PROPOFOL N/A 04/19/2022   Procedure: ESOPHAGOGASTRODUODENOSCOPY (EGD) WITH PROPOFOL;  Surgeon: Lemar Lofty., MD;  Location: [facility];  Service: Gastroenterology;  Laterality: N/A;   EUS N/A 04/19/2022   Procedure: UPPER ENDOSCOPIC ULTRASOUND (EUS) RADIAL;  Surgeon: Lemar Lofty., MD;  Location: [facility];   Service: Gastroenterology;  Laterality: N/A;   EXCISION OF SKIN TAG  05/03/2022   Procedure: EXCISION OF CHEST WALL SKIN LESION;  Surgeon: Fritzi Mandes, MD;  Location: [facility] OR;  Service: General;;   LAPAROSCOPY N/A 05/03/2022   Procedure: LAPAROSCOPY DIAGNOSTIC WITH PERITONEAL WASHINGS;  Surgeon: Fritzi Mandes, MD;  Location: [facility] OR;  Service: General;  Laterality: N/A;   POLYPECTOMY  04/19/2022   Procedure: POLYPECTOMY;  Surgeon: Lemar Lofty., MD;  Location: [facility];  Service: Gastroenterology;;   PORTACATH PLACEMENT N/A 05/03/2022   Procedure: INSERTION PORT-A-CATH WITH ULTRASOUND GUIDANCE;  Surgeon: Fritzi Mandes, MD;  Location: [facility] OR;  Service: General;  Laterality: N/A;   UPPER GASTROINTESTINAL ENDOSCOPY      I have reviewed the social history and family history with the patient and they are unchanged from previous note.  ALLERGIES:  is allergic to aspirin, cyclobenzaprine, naproxen sodium, zithromax [azithromycin dihydrate], oxaliplatin, and dilaudid [hydromorphone].  MEDICATIONS:  Current Outpatient Medications  Medication Sig Dispense Refill   famotidine (PEPCID) 20 MG tablet Take 1 tablet (20 mg total) by mouth 2 (two) times daily. 60  tablet 2   acetaminophen (TYLENOL) 500 MG tablet Take 2 tablets (1,000 mg total) by mouth every 8 (eight) hours as needed (pain). 30 tablet 1   amLODipine (NORVASC) 10 MG tablet Take 1 tablet (10 mg total) by mouth daily. 90 tablet 1   Bacillus Coagulans-Inulin (PROBIOTIC-PREBIOTIC) 1-250 BILLION-MG CAPS Take 2 capsules by mouth daily. 30 capsule 1   Biotin 1000 MCG CHEW Chew 1,000 mcg by mouth daily. 30 tablet 2   Blood Glucose Monitoring Suppl (TRUE METRIX METER) w/Device KIT 1 kit by Does not apply route 3 (three) times daily as needed. 1 kit 0   capecitabine (XELODA) 500 MG tablet Take 3 tablets (1500 mg) by mouth in the morning and 4 tablets (2000 mg) by mouth in the evening. Take within 30 minutes after meals. Take for 14 days on,  then off for 7 days. Repeat every 21 days. 98 tablet 0   Cholecalciferol (VITAMIN D3) 125 MCG (5000 UT) TABS Take 1 tablet (5,000 Units total) by mouth daily. 30 tablet 2   diphenhydrAMINE-prednisoLONE-nystatin in lidocaine solution Take 5 mls by mouth 3 (three) times daily as needed for mouth pain. 140 mL 0   lidocaine-prilocaine (EMLA) cream Apply 1 Application topically as needed. 30 g 1   ondansetron (ZOFRAN-ODT) 4 MG disintegrating tablet Take 2 tablets (8 mg total) by mouth every 8 (eight) hours as needed for nausea or vomiting. Dissolve one tab on tongue 30 minutes before each dose of bowel prep 20 tablet 2   pantoprazole (PROTONIX) 40 MG tablet Take 1 tablet (40 mg total) by mouth 2 (two) times daily. (Patient not taking: Reported on 11/26/2022) 180 tablet 0   potassium chloride SA (KLOR-CON M) 20 MEQ tablet Take 1 tablet (20 mEq total) by mouth 2 (two) times daily. Change to once daily after 5 days, until finish 30 tablet 0   prochlorperazine (COMPAZINE) 10 MG tablet Take 1 tablet (10 mg total) by mouth every 6 (six) hours as needed for nausea or vomiting. 30 tablet 1   rosuvastatin (CRESTOR) 5 MG tablet Take 1 tablet (5 mg total) by mouth daily. (Patient not taking: Reported on 04/18/2022) 90 tablet 1   sucralfate (CARAFATE) 1 g tablet Take 1 tablet (1 g total) by mouth 2 (two) times daily. 60 tablet 1   Current Facility-Administered Medications  Medication Dose Route Frequency Provider Last Rate Last Admin   0.9 %  sodium chloride infusion  500 mL Intravenous Continuous Tressia Danas, MD        PHYSICAL EXAMINATION: ECOG PERFORMANCE STATUS: 1 - Symptomatic but completely ambulatory  Vitals:   11/28/22 0825  BP: (!) 134/93  Pulse: 67  Resp: 16  Temp: 99 F (37.2 C)  SpO2: 100%   Wt Readings from Last 3 Encounters:  11/28/22 189 lb 8 oz (86 kg)  11/14/22 188 lb 1.6 oz (85.3 kg)  10/31/22 192 lb 8 oz (87.3 kg)     GENERAL:alert, no distress and comfortable SKIN: skin  color normal, no rashes or significant lesions EYES: normal, Conjunctiva are pink and non-injected, sclera clear  NEURO: alert & oriented x 3 with fluent speech LABORATORY DATA:  I have reviewed the data as listed    Latest Ref Rng & Units 11/28/2022    8:08 AM 11/14/2022    9:00 AM 10/31/2022    8:24 AM  CBC  WBC 4.0 - 10.5 K/uL 3.8  5.5  5.1   Hemoglobin 12.0 - 15.0 g/dL 16.1  09.6  04.5  Hematocrit 36.0 - 46.0 % 38.1  39.7  38.5   Platelets 150 - 400 K/uL 257  236  260         Latest Ref Rng & Units 11/28/2022    8:08 AM 11/14/2022    9:00 AM 10/31/2022    8:24 AM  CMP  Glucose 70 - 99 mg/dL 409  89  811   BUN 6 - 20 mg/dL 9  12  9    Creatinine 0.44 - 1.00 mg/dL 9.14  7.82  9.56   Sodium 135 - 145 mmol/L 141  140  140   Potassium 3.5 - 5.1 mmol/L 3.5  3.6  3.5   Chloride 98 - 111 mmol/L 104  104  105   CO2 22 - 32 mmol/L 29  28  28    Calcium 8.9 - 10.3 mg/dL 9.5  9.7  9.2   Total Protein 6.5 - 8.1 g/dL 7.3  7.5  6.6   Total Bilirubin 0.3 - 1.2 mg/dL 0.9  0.8  0.5   Alkaline Phos 38 - 126 U/L 101  131  129   AST 15 - 41 U/L 25  26  28    ALT 0 - 44 U/L 19  19  20        RADIOGRAPHIC STUDIES: I have personally reviewed the radiological images as listed and agreed with the findings in the report. CT CHEST ABDOMEN PELVIS W CONTRAST  Result Date: 11/27/2022 CLINICAL DATA:  History of gastric cancer, assess treatment response. * Tracking Code: BO * EXAM: CT CHEST, ABDOMEN, AND PELVIS WITH CONTRAST TECHNIQUE: Multidetector CT imaging of the chest, abdomen and pelvis was performed following the standard protocol during bolus administration of intravenous contrast. RADIATION DOSE REDUCTION: This exam was performed according to the departmental dose-optimization program which includes automated exposure control, adjustment of the mA and/or kV according to patient size and/or use of iterative reconstruction technique. CONTRAST:  75mL OMNIPAQUE IOHEXOL 300 MG/ML  SOLN COMPARISON:  [facility]  including CT August 27, 2022 and PET-CT May 30, 2022 FINDINGS: CT CHEST FINDINGS Cardiovascular: Accessed right chest Port-A-Cath with tip near the superior cavoatrial junction. Normal caliber thoracic aorta. No central pulmonary embolus on this nondedicated study. Normal size heart. No significant pericardial effusion/thickening. Mediastinum/Nodes: No suspicious thyroid nodule. No pathologically enlarged mediastinal, hilar or axillary lymph nodes. Mild wall thickening of the distal esophagus/GE junction for instance on image 54/2. Lungs/Pleura: No suspicious pulmonary nodules or masses. No focal airspace consolidation. No pleural effusion. No pneumothorax. Musculoskeletal: No aggressive lytic or blastic lesion of bone. CT ABDOMEN PELVIS FINDINGS Hepatobiliary: Hepatic steatosis. No suspicious hepatic lesion. Gallbladder is unremarkable. No biliary ductal dilation. Pancreas: No pancreatic ductal dilation or evidence of acute inflammation. Spleen: No splenomegaly or focal splenic lesion. Adrenals/Urinary Tract: Bilateral adrenal glands appear normal. Stable septated 7.4 cm Bosniak classification 2 left renal cyst on image 78/2. No hydronephrosis. Mild wall thickening of a nondistended urinary bladder. Stomach/Bowel: Questionable thickening of the GE junction on image 54/2. Wall thickening versus underdistention of the gastric antrum on image 68/2. No pathologic dilation of small or large bowel. Questionable asymmetric wall thickening of the rectum on image 122/2. Vascular/Lymphatic: Normal caliber abdominal aorta. Smooth IVC contours. The portal, splenic and superior mesenteric veins are patent Soft tissue mass in the left upper quadrant with focal areas of internal calcification measures 4.0 x 3.5 cm on image 63/2 stable dating back to 2012. Small upper abdominal/retroperitoneal lymph nodes are also stable over remote prior examinations. For reference: -left  upper quadrant lymph node measures 9 mm in short axis  on image 61/2, unchanged. -gastrohepatic ligament lymph node measures 9 mm in short axis on image 60/2, unchanged. No new pathologically enlarged or enlarging abdominopelvic lymph nodes identified. Reproductive: Status post hysterectomy. No adnexal masses. Other: No significant abdominopelvic free fluid. Musculoskeletal: Lipoma in the right gluteal musculature image 106/2. No aggressive lytic or blastic lesion of bone IMPRESSION: 1. Questionable thickening of the distal esophagus/GE junction and gastric antrum, consider further evaluation with endoscopy. 2. Chronically stable left upper quadrant nodularity and prominent lymph nodes again favored treated lymphoma. Continued attention on follow-up imaging suggested. 3. No convincing evidence of metastatic disease in the chest, abdomen or pelvis. 4. Questionable asymmetric wall thickening of the rectum, consider further evaluation with colonoscopy. 5. Mild wall thickening of a nondistended urinary bladder, correlate with urinalysis to exclude cystitis. 6. Hepatic steatosis. Electronically Signed   By: Maudry Mayhew M.D.   On: 11/27/2022 11:30      No orders of the defined types were placed in this encounter.  All questions were answered. The patient knows to call the clinic with any problems, questions or concerns. No barriers to learning was detected. The total time spent in the appointment was 40 minutes.     [name], MD 11/28/2022   Carolin Coy, CMA, am acting as scribe for [name], MD.   I have reviewed the above documentation for accuracy and completeness, and I agree with the above.

## 2022-11-28 ENCOUNTER — Other Ambulatory Visit: Payer: Self-pay

## 2022-11-28 ENCOUNTER — Inpatient Hospital Stay: Payer: Medicaid Other

## 2022-11-28 ENCOUNTER — Inpatient Hospital Stay: Payer: Medicaid Other | Admitting: Hematology

## 2022-11-28 ENCOUNTER — Ambulatory Visit: Payer: Medicaid Other

## 2022-11-28 ENCOUNTER — Other Ambulatory Visit (HOSPITAL_COMMUNITY): Payer: Self-pay

## 2022-11-28 ENCOUNTER — Encounter: Payer: Self-pay | Admitting: Hematology

## 2022-11-28 VITALS — BP 134/93 | HR 67 | Temp 99.0°F | Resp 16 | Wt 189.5 lb

## 2022-11-28 DIAGNOSIS — C162 Malignant neoplasm of body of stomach: Secondary | ICD-10-CM | POA: Diagnosis not present

## 2022-11-28 DIAGNOSIS — Z5111 Encounter for antineoplastic chemotherapy: Secondary | ICD-10-CM | POA: Diagnosis not present

## 2022-11-28 DIAGNOSIS — Z95828 Presence of other vascular implants and grafts: Secondary | ICD-10-CM

## 2022-11-28 LAB — CBC WITH DIFFERENTIAL (CANCER CENTER ONLY)
Abs Immature Granulocytes: 0.01 10*3/uL (ref 0.00–0.07)
Basophils Absolute: 0.1 10*3/uL (ref 0.0–0.1)
Basophils Relative: 2 %
Eosinophils Absolute: 0.1 10*3/uL (ref 0.0–0.5)
Eosinophils Relative: 2 %
HCT: 38.1 % (ref 36.0–46.0)
Hemoglobin: 13.3 g/dL (ref 12.0–15.0)
Immature Granulocytes: 0 %
Lymphocytes Relative: 22 %
Lymphs Abs: 0.8 10*3/uL (ref 0.7–4.0)
MCH: 31.4 pg (ref 26.0–34.0)
MCHC: 34.9 g/dL (ref 30.0–36.0)
MCV: 89.9 fL (ref 80.0–100.0)
Monocytes Absolute: 0.6 10*3/uL (ref 0.1–1.0)
Monocytes Relative: 16 %
Neutro Abs: 2.2 10*3/uL (ref 1.7–7.7)
Neutrophils Relative %: 58 %
Platelet Count: 257 10*3/uL (ref 150–400)
RBC: 4.24 MIL/uL (ref 3.87–5.11)
RDW: 14.6 % (ref 11.5–15.5)
WBC Count: 3.8 10*3/uL — ABNORMAL LOW (ref 4.0–10.5)
nRBC: 0 % (ref 0.0–0.2)

## 2022-11-28 LAB — CMP (CANCER CENTER ONLY)
ALT: 19 U/L (ref 0–44)
AST: 25 U/L (ref 15–41)
Albumin: 4.3 g/dL (ref 3.5–5.0)
Alkaline Phosphatase: 101 U/L (ref 38–126)
Anion gap: 8 (ref 5–15)
BUN: 9 mg/dL (ref 6–20)
CO2: 29 mmol/L (ref 22–32)
Calcium: 9.5 mg/dL (ref 8.9–10.3)
Chloride: 104 mmol/L (ref 98–111)
Creatinine: 0.68 mg/dL (ref 0.44–1.00)
GFR, Estimated: >60 mL/min (ref >60)
Glucose, Bld: 135 mg/dL — ABNORMAL HIGH (ref 70–99)
Potassium: 3.5 mmol/L (ref 3.5–5.1)
Sodium: 141 mmol/L (ref 135–145)
Total Bilirubin: 0.9 mg/dL (ref 0.3–1.2)
Total Protein: 7.3 g/dL (ref 6.5–8.1)

## 2022-11-28 LAB — FERRITIN: Ferritin: 116 ng/mL (ref 11–307)

## 2022-11-28 MED ORDER — FAMOTIDINE 20 MG PO TABS
20.0000 mg | ORAL_TABLET | Freq: Two times a day (BID) | ORAL | 2 refills | Status: DC
  Filled 2022-11-28 (×2): qty 60, 30d supply, fill #0
  Filled 2022-12-27: qty 60, 30d supply, fill #1
  Filled 2023-01-23: qty 60, 30d supply, fill #2

## 2022-11-28 MED ORDER — HEPARIN SOD (PORK) LOCK FLUSH 100 UNIT/ML IV SOLN
500.0000 [IU] | Freq: Once | INTRAVENOUS | Status: AC
  Administered 2022-11-28: 500 [IU]

## 2022-11-28 MED ORDER — SODIUM CHLORIDE 0.9% FLUSH
10.0000 mL | Freq: Once | INTRAVENOUS | Status: AC
  Administered 2022-11-28: 10 mL

## 2022-11-29 ENCOUNTER — Other Ambulatory Visit: Payer: Self-pay

## 2022-12-06 NOTE — Progress Notes (Addendum)
Patient Care Team: Sheryl Grandchild, MD as PCP - General (Internal Medicine) Sheryl Mood, MD as Consulting Physician (Oncology)  Clinic Day:  12/07/2022  Referring physician: [name], MD  I connected with Sheryl [name] on 12/06/22 at  9:30 AM EDT by telephone and verified that I am speaking with the correct person using two identifiers.   I discussed the limitations, risks, security and privacy concerns of performing an evaluation and management service by telemedicine and the availability of in-person appointments. I also discussed with the patient that there may be a patient responsible charge related to this service. The patient expressed understanding and agreed to proceed.   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   -she started first line chemo FLOT on 1/10 -She understands that chemotherapy is palliative, to prolong her life.  We are unlikely going to cure her cancer. -PD-L1 0-1%, no significant benefit from PD-L1 immunotherapy, FO revealed FGFR2 amplification and fusion (+), FGFR inhibitors can be considered in future, no other targeted therapy available  -She has been tolerating chemo very well, will continue for now  -PET scan from 05/30/2022 was negative for primary tumor or metastatic disease, the known peritoneal mets did not show on PET.  -she has been tolerating chemo well overall. Due to fatigue, I have changed her chemo from FLOT to FOLFOX on 06/20/2022, she tolerated well -she previously  asked the role of surgery, depends on her next restaging CT scan findings, I may refer her to [facility] or [facility] to discuss HIPEC surgery  -due to her infusion reaction to oxaliplatin on C5, we added additional premeds and gave slow infusion over 4 hours for cycle 6 and she tolerated well  -She is not able to return to work due to the cancer and treatment related symptoms.  -She is tolerating FOLFOX well overall, with moderate fatigue for a few days after infusion but able to recover well.  No signs of neuropathy at this point. -Restaging CT abdomen pelvis from August 27, 2022 showed no residual disease.  -We again discussed maintenance therapy with Xeloda down the road, we will stop oxaliplatin when she develops side effects especially neuropathy, or after next scan -repeated staging CT from 11/26/2021 showed stable disease  -started on maintenance Xeloda on 12/03/2022, taking 3 tablets in AM and 4 tablets in PM  -she states that she is doing well with Xeloda. She reports no negative side effects.  -labs check and in-person follow up scheduled for 12/19/2022  -repeat staging CT in 3 months (02/27/2023)   Plan: Continue with maintenance Xeloda, 3 tablets in AM and 4 tablets in PM Labs/flush and follow up as scheduled 12/19/2022  Repeat staging CT in 01/2023   The patient understands the plans discussed today and is in agreement with them.  She knows to contact our office if she develops concerns prior to her next appointment.  I provided 15 minutes of non face-to-face time during this encounter and > 50% was spent counseling as documented under my assessment and plan.    Sheryl [name]  Vicki Mallet, NP  [facility] [address] [place] [address] Dept: [phone] Dept Fax: [phone]   No orders of the defined types were placed in this encounter.     CHIEF COMPLAINT:  CC: gastric cancer   Current Treatment:  maintenance  Xeloda, 3 tablets in AM and 4 tablets in PM   INTERVAL HISTORY:  Sheryl [name] is here today for repeat clinical assessment. She denies fevers or chills. She denies pain. Her appetite is good. Her weight has been stable.  Gastric Cancer -repeated staging CT from 11/26/2021 showed stable disease  -started on maintenance Xeloda on 12/03/2022, taking 3 tablets in AM and 4 tablets in PM  -she states that she is doing very well with the medication. In fact, she does not feel like she is taking anything. She reports no negative side effects. She has good appetite. Denies fevers, chills, or night sweats. Denies nausea or vomiting. She is moving her bowels regularly. She does not have blood in her stool. Her energy levels are at baseline. She denies fatigue or lethargy.  She is scheduled to have lab check and in-person follow up 12/19/2022.  -repeat staging CT in 3 months (02/27/2023)   I have reviewed the past medical history, past surgical history, social history and family history with the patient and they are unchanged from previous note.  ALLERGIES:  is allergic to aspirin, cyclobenzaprine, naproxen sodium, zithromax [azithromycin dihydrate], oxaliplatin, and dilaudid [hydromorphone].  MEDICATIONS:  Current Outpatient Medications  Medication Sig Dispense Refill   acetaminophen (TYLENOL) 500 MG tablet Take 2 tablets (1,000 mg total) by mouth every 8 (eight) hours as needed (pain). 30 tablet 1   amLODipine (NORVASC) 10 MG tablet Take 1 tablet (10 mg total) by mouth daily. 90 tablet 1   Bacillus Coagulans-Inulin (PROBIOTIC-PREBIOTIC) 1-250 BILLION-MG CAPS Take 2 capsules by mouth daily. 30 capsule 1   Biotin 1000 MCG CHEW Chew 1,000 mcg by mouth daily. 30 tablet 2   Blood Glucose Monitoring Suppl (TRUE METRIX METER) w/Device KIT 1 kit by Does not apply route 3 (three) times daily as needed. 1 kit 0   capecitabine (XELODA) 500 MG tablet Take 3 tablets (1500 mg) by mouth in the morning and 4 tablets (2000 mg)  by mouth in the evening. Take within 30 minutes after meals. Take for 14 days on, then off for 7 days. Repeat every 21 days. 98 tablet 0   Cholecalciferol (VITAMIN D3) 125 MCG (5000 UT) TABS Take 1 tablet (5,000 Units total) by mouth daily. 30 tablet 2   diphenhydrAMINE-prednisoLONE-nystatin in lidocaine solution Take 5 mls by mouth 3 (three) times daily as needed for mouth pain. 140 mL 0   famotidine (PEPCID) 20 MG tablet Take 1 tablet (20 mg total) by mouth 2 (two) times daily. 60 tablet 2   lidocaine-prilocaine (EMLA) cream Apply 1 Application topically as needed. 30 g 1   ondansetron (ZOFRAN-ODT) 4 MG disintegrating tablet Take 2 tablets (8 mg total) by mouth every 8 (eight) hours as needed for nausea or vomiting. Dissolve one tab on tongue 30 minutes before each dose of bowel prep 20 tablet 2   pantoprazole (PROTONIX) 40 MG tablet Take 1 tablet (40 mg total) by mouth 2 (two) times daily. (Patient not taking: Reported on 11/26/2022) 180 tablet 0   potassium chloride SA (KLOR-CON M) 20 MEQ tablet Take 1 tablet (20 mEq total) by mouth 2 (two) times daily. Change to once  daily after 5 days, until finish 30 tablet 0   prochlorperazine (COMPAZINE) 10 MG tablet Take 1 tablet (10 mg total) by mouth every 6 (six) hours as needed for nausea or vomiting. 30 tablet 1   rosuvastatin (CRESTOR) 5 MG tablet Take 1 tablet (5 mg total) by mouth daily. (Patient not taking: Reported on 04/18/2022) 90 tablet 1   sucralfate (CARAFATE) 1 g tablet Take 1 tablet (1 g total) by mouth 2 (two) times daily. 60 tablet 1   Current Facility-Administered Medications  Medication Dose Route Frequency Provider Last Rate Last Admin   0.9 %  sodium chloride infusion  500 mL Intravenous Continuous Tressia Danas, MD        HISTORY OF PRESENT ILLNESS:   Oncology History Overview Note   Cancer Staging  Gastric cancer [facility]) Staging form: Stomach, AJCC 8th Edition - Clinical stage from 04/19/2022: Stage IVB (cT2, cN0, pM1) -  Signed by Sheryl Mood, MD on 05/08/2022 Total positive nodes: 0     Gastric cancer (HCC)  03/30/2022 Procedure   EGD:  Impression:  - Normal esophagus. - A few gastric polyps. Biopsied. - Gastritis. Biopsied. - Non-bleeding gastric ulcer with no stigmata of bleeding. Biopsied. - Normal examined duodenum. Biopsied.  Findings: Diffuse moderate inflammation characterized by congestion (edema), friability and granularity was found in the cardia, in the gastric fundus and in the gastric body. There were associated erosions in multiple places. Biopsies were taken from the antrum, body, and fundus with a cold forceps for histology. Estimated blood loss was minimal.  One non-bleeding cratered gastric ulcer with no stigmata of bleeding was found on the greater curvature of the stomach. The lesion was 6 mm in largest dimension. The mucosa around the ulcer was heaped and led to some deformity in the antrum. Biopsies were taken with a cold forceps for histology. Estimated blood loss was minimal.    03/30/2022 Pathology Results   Patient: SHAQUANTA, KUJALA  Accession: [id number]  Diagnosis 1. Surgical [P], duodenal - BENIGN SMALL BOWEL MUCOSA WITH NO SIGNIFICANT PATHOLOGIC CHANGES 2. Surgical [P], gastric antrum - GASTRIC ANTRAL MUCOSA WITH FEATURES OF REACTIVE GASTROPATHY - NEGATIVE FOR H. PYLORI ON H&E STAIN - NEGATIVE FOR INTESTINAL METAPLASIA OR MALIGNANCY 3. Surgical [P], gastric body - GASTRIC OXYNTIC MUCOSA WITH REACTIVE/REPARATIVE CHANGES - NEGATIVE FOR H. PYLORI ON H&E STAIN - NEGATIVE FOR INTESTINAL METAPLASIA, DYSPLASIA OR MALIGNANCY 4. Surgical [P], greater curve ulceration - ADENOCARCINOMA WITH SIGNET RING CELL FEATURES (SEE NOTE) 5. Surgical [P], gastric polyps - ADENOCARCINOMA WITH SIGNET RING CELL FEATURES (SEE NOTE) 6. Surgical [P], fundus (gastric) - ADENOCARCINOMA WITH SIGNET RING CELL FEATURES (SEE NOTE) 7. Surgical [P], colon, ascending, polyp (1) - TUBULAR  ADENOMA. - NO HIGH GRADE DYSPLASIA OR MALIGNANCY. 8. Surgical [P], colon, transverse, polyp (1) - TUBULAR ADENOMA. - NO HIGH GRADE DYSPLASIA OR MALIGNANCY.    04/13/2022 Initial Diagnosis   Gastric cancer (HCC)   04/19/2022 Cancer Staging   Staging form: Stomach, AJCC 8th Edition - Clinical stage from 04/19/2022: Stage IVB (cT2, cN0, pM1) - Signed by Sheryl Mood, MD on 05/08/2022 Total positive nodes: 0   05/05/2022 Genetic Testing   Negative genetic testing on the Multi-cancer gene panel + RNA.  FH c.259C>T VUS identified.  The report date is May 05, 2022.  The Multi-Cancer + RNA Panel offered by [facility] includes sequencing and/or deletion/duplication analysis of the following 70 genes:  AIP*, ALK, APC*, ATM*, AXIN2*, BAP1*, BARD1*, BLM*, BMPR1A*, BRCA1*, BRCA2*, BRIP1*, CDC73*, CDH1*, CDK4, CDKN1B*,  CDKN2A, CHEK2*, CTNNA1*, DICER1*, EPCAM (del/dup only), EGFR, FH*, FLCN*, GREM1 (promoter dup only), HOXB13, KIT, LZTR1, MAX*, MBD4, MEN1*, MET, MITF, MLH1*, MSH2*, MSH3*, MSH6*, MUTYH*, NF1*, NF2*, NTHL1*, PALB2*, PDGFRA, PMS2*, POLD1*, POLE*, POT1*, PRKAR1A*, PTCH1*, PTEN*, RAD51C*, RAD51D*, RB1*, RET, SDHA* (sequencing only), SDHAF2*, SDHB*, SDHC*, SDHD*, SMAD4*, SMARCA4*, SMARCB1*, SMARCE1*, STK11*, SUFU*, TMEM127*, TP53*, TSC1*, TSC2*, VHL*. RNA analysis is performed for * genes.    05/09/2022 - 06/07/2022 Chemotherapy   Patient is on Treatment Plan : GASTROESOPHAGEAL FLOT q14d X 4 cycles      Miscellaneous   Foundation One  Biomarker Findings Microsatellite status- Cannot be determined Tumor Mutational Burden- Cannot be determined  Genomic Findings  FGFR2 amplification,FGFR2-TACC2 fusion,  Rearrangement intron 17 ARAF amplification CCND3 amplification TP53 [id number]*74     05/30/2022 Imaging    IMPRESSION: 1. Mild hypermetabolism corresponding to a dominant left upper quadrant mass and smaller perigastric nodules or nodes. Given size stability back to 2012, favored to be related  to treated lymphoma. Recommend attention to the dominant left upper quadrant soft tissue mass on follow-up exams to exclude unlikely recurrent lymphoma. 2. No gastric hypermetabolism and no typical findings of metastatic disease.   06/20/2022 -  Chemotherapy   Patient is on Treatment Plan : GASTRIC FOLFOX q14d x 12 cycles     08/27/2022 Imaging    IMPRESSION: No focal gastric mass on CT.   No findings suspicious for recurrent or metastatic disease.   Stable left upper abdominal soft tissue lesion and small lymph nodes, chronic, favoring treated lymphoma.   11/27/2022 Imaging    IMPRESSION: 1. Questionable thickening of the distal esophagus/GE junction and gastric antrum, consider further evaluation with endoscopy. 2. Chronically stable left upper quadrant nodularity and prominent lymph nodes again favored treated lymphoma. Continued attention on follow-up imaging suggested. 3. No convincing evidence of metastatic disease in the chest, abdomen or pelvis. 4. Questionable asymmetric wall thickening of the rectum, consider further evaluation with colonoscopy. 5. Mild wall thickening of a nondistended urinary bladder, correlate with urinalysis to exclude cystitis. 6. Hepatic steatosis.       REVIEW OF SYSTEMS:   Constitutional: Denies fevers, chills or abnormal weight loss Eyes: Denies blurriness of vision Ears, nose, mouth, throat, and face: Denies mucositis or sore throat Respiratory: Denies cough, dyspnea or wheezes Cardiovascular: Denies palpitation, chest discomfort or lower extremity swelling Gastrointestinal:  Denies nausea, heartburn or change in bowel habits Skin: Denies abnormal skin rashes Lymphatics: Denies new lymphadenopathy or easy bruising Neurological:Denies numbness, tingling or new weaknesses Behavioral/Psych: [name] is stable, no new changes  All other systems were reviewed with the patient and are negative.   VITALS:   Wt Readings from Last 3  Encounters:  11/28/22 189 lb 8 oz (86 kg)  11/14/22 188 lb 1.6 oz (85.3 kg)  10/31/22 192 lb 8 oz (87.3 kg)    Performance status (ECOG): 0 - Asymptomatic   LABORATORY DATA:  I have reviewed the data as listed    Component Value Date/Time   NA 141 11/28/2022 0808   NA 137 12/20/2020 0957   K 3.5 11/28/2022 0808   CL 104 11/28/2022 0808   CO2 29 11/28/2022 0808   GLUCOSE 135 (H) 11/28/2022 0808   BUN 9 11/28/2022 0808   BUN 11 12/20/2020 0957   CREATININE 0.68 11/28/2022 0808   CALCIUM 9.5 11/28/2022 0808   PROT 7.3 11/28/2022 0808   PROT 7.3 12/20/2020 0957   ALBUMIN 4.3 11/28/2022 0808   ALBUMIN 4.7 12/20/2020 0957  AST 25 11/28/2022 0808   ALT 19 11/28/2022 0808   ALKPHOS 101 11/28/2022 0808   BILITOT 0.9 11/28/2022 0808   GFRNONAA >60 11/28/2022 0808   GFRAA 106 03/31/2020 1036     Lab Results  Component Value Date   WBC 3.8 (L) 11/28/2022   NEUTROABS 2.2 11/28/2022   HGB 13.3 11/28/2022   HCT 38.1 11/28/2022   MCV 89.9 11/28/2022   PLT 257 11/28/2022       RADIOGRAPHIC STUDIES: I have personally reviewed the radiological images as listed and agreed with the findings in the report. CT CHEST ABDOMEN PELVIS W CONTRAST  Result Date: 11/27/2022 CLINICAL DATA:  History of gastric cancer, assess treatment response. * Tracking Code: BO * EXAM: CT CHEST, ABDOMEN, AND PELVIS WITH CONTRAST TECHNIQUE: Multidetector CT imaging of the chest, abdomen and pelvis was performed following the standard protocol during bolus administration of intravenous contrast. RADIATION DOSE REDUCTION: This exam was performed according to the departmental dose-optimization program which includes automated exposure control, adjustment of the mA and/or kV according to patient size and/or use of iterative reconstruction technique. CONTRAST:  75mL OMNIPAQUE IOHEXOL 300 MG/ML  SOLN COMPARISON:  Priors including CT August 27, 2022 and PET-CT May 30, 2022 FINDINGS: CT CHEST FINDINGS  Cardiovascular: Accessed right chest Port-A-Cath with tip near the superior cavoatrial junction. Normal caliber thoracic aorta. No central pulmonary embolus on this nondedicated study. Normal size heart. No significant pericardial effusion/thickening. Mediastinum/Nodes: No suspicious thyroid nodule. No pathologically enlarged mediastinal, hilar or axillary lymph nodes. Mild wall thickening of the distal esophagus/GE junction for instance on image 54/2. Lungs/Pleura: No suspicious pulmonary nodules or masses. No focal airspace consolidation. No pleural effusion. No pneumothorax. Musculoskeletal: No aggressive lytic or blastic lesion of bone. CT ABDOMEN PELVIS FINDINGS Hepatobiliary: Hepatic steatosis. No suspicious hepatic lesion. Gallbladder is unremarkable. No biliary ductal dilation. Pancreas: No pancreatic ductal dilation or evidence of acute inflammation. Spleen: No splenomegaly or focal splenic lesion. Adrenals/Urinary Tract: Bilateral adrenal glands appear normal. Stable septated 7.4 cm Bosniak classification 2 left renal cyst on image 78/2. No hydronephrosis. Mild wall thickening of a nondistended urinary bladder. Stomach/Bowel: Questionable thickening of the GE junction on image 54/2. Wall thickening versus underdistention of the gastric antrum on image 68/2. No pathologic dilation of small or large bowel. Questionable asymmetric wall thickening of the rectum on image 122/2. Vascular/Lymphatic: Normal caliber abdominal aorta. Smooth IVC contours. The portal, splenic and superior mesenteric veins are patent Soft tissue mass in the left upper quadrant with focal areas of internal calcification measures 4.0 x 3.5 cm on image 63/2 stable dating back to 2012. Small upper abdominal/retroperitoneal lymph nodes are also stable over remote prior examinations. For reference: -left upper quadrant lymph node measures 9 mm in short axis on image 61/2, unchanged. -gastrohepatic ligament lymph node measures 9 mm in short  axis on image 60/2, unchanged. No new pathologically enlarged or enlarging abdominopelvic lymph nodes identified. Reproductive: Status post hysterectomy. No adnexal masses. Other: No significant abdominopelvic free fluid. Musculoskeletal: Lipoma in the right gluteal musculature image 106/2. No aggressive lytic or blastic lesion of bone IMPRESSION: 1. Questionable thickening of the distal esophagus/GE junction and gastric antrum, consider further evaluation with endoscopy. 2. Chronically stable left upper quadrant nodularity and prominent lymph nodes again favored treated lymphoma. Continued attention on follow-up imaging suggested. 3. No convincing evidence of metastatic disease in the chest, abdomen or pelvis. 4. Questionable asymmetric wall thickening of the rectum, consider further evaluation with colonoscopy. 5. Mild wall thickening of  a nondistended urinary bladder, correlate with urinalysis to exclude cystitis. 6. Hepatic steatosis. Electronically Signed   By: Maudry Mayhew M.D.   On: 11/27/2022 11:30    Addendum I have participated her care today and reviewed the above documentation for accuracy and completeness, and I agree with the above.  Sheryl Mood MD 12/07/2022

## 2022-12-06 NOTE — Assessment & Plan Note (Addendum)
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   -she started first line chemo FLOT on 1/10 -She understands that chemotherapy is palliative, to prolong her life.  We are unlikely going to cure her cancer. -PD-L1 0-1%, no significant benefit from PD-L1 immunotherapy, FO revealed FGFR2 amplification and fusion (+), FGFR inhibitors can be considered in future, no other targeted therapy available  -She has been tolerating chemo very well, will continue for now  -PET scan from 05/30/2022 was negative for primary tumor or metastatic disease, the known peritoneal mets did not show on PET.  -she has been tolerating chemo well overall. Due to fatigue, I have changed her chemo from FLOT to FOLFOX on 06/20/2022, she tolerated well -she previously asked the role of surgery, depends on her next restaging CT scan findings, I may refer her to [facility] or [facility] to discuss HIPEC surgery  -due to her infusion reaction to oxaliplatin on C5, we added additional premeds and gave slow infusion over 4 hours for cycle 6 and she tolerated well  -She is not able to return to work due to the cancer and treatment related symptoms.  -She is tolerating FOLFOX well overall, with moderate fatigue for a few days after infusion but able to recover well.  No signs of neuropathy at this point. -Restaging CT abdomen pelvis from August 27, 2022 showed no residual disease.  -We again discussed maintenance therapy with Xeloda down the road, we will stop oxaliplatin when she develops side effects especially neuropathy, or after next scan -repeated staging CT from 11/26/2021 showed stable disease  -started on maintenance Xeloda on 12/03/2022, taking 3 tablets in AM and 4 tablets in PM  -she states that she is doing well with Xeloda. She reports no negative  side effects.  -labs check and in-person follow up scheduled for 12/19/2022  -repeat staging CT in 3 months (02/27/2023)

## 2022-12-07 ENCOUNTER — Other Ambulatory Visit: Payer: Self-pay

## 2022-12-07 ENCOUNTER — Inpatient Hospital Stay: Payer: Medicaid Other | Attending: Physician Assistant | Admitting: Dietician

## 2022-12-07 ENCOUNTER — Inpatient Hospital Stay (HOSPITAL_BASED_OUTPATIENT_CLINIC_OR_DEPARTMENT_OTHER): Payer: Medicaid Other | Admitting: Nurse Practitioner

## 2022-12-07 ENCOUNTER — Telehealth: Payer: Self-pay | Admitting: Dietician

## 2022-12-07 DIAGNOSIS — G620 Drug-induced polyneuropathy: Secondary | ICD-10-CM | POA: Insufficient documentation

## 2022-12-07 DIAGNOSIS — C162 Malignant neoplasm of body of stomach: Secondary | ICD-10-CM | POA: Diagnosis not present

## 2022-12-07 DIAGNOSIS — C169 Malignant neoplasm of stomach, unspecified: Secondary | ICD-10-CM | POA: Insufficient documentation

## 2022-12-07 DIAGNOSIS — C786 Secondary malignant neoplasm of retroperitoneum and peritoneum: Secondary | ICD-10-CM | POA: Insufficient documentation

## 2022-12-07 NOTE — Telephone Encounter (Signed)
Nutrition Follow-up:  Pt with gastric cancer. She is currently receiving maintenance Xeloda (start 8/5).   Spoke with pt via telephone. 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 Pt endorses good appetite. Recalls eating fried chicken for the first time yesterday during travel. She tolerated this well. States medication pamphlet instructed no fried foods and hope this was okay to have. Patient denies nutrition impact symptoms at this time.   Medications: reviewed   Labs: 7/31 reviewed   Anthropometrics:  189 lb 8 oz on 7/31  7/17 - 188 lb 1.6 oz  7/3 - 192 lb 8 oz  6/19 - 191 lb 9.6 oz    NUTRITION DIAGNOSIS: Unintended wt loss - stable   INTERVENTION:  Encouraged regular diet as tolerated - educated on common side effect of xeloda (diarrhea) which could be worsened with fried/greasy foods    MONITORING, EVALUATION, GOAL: wt trends, intake   NEXT VISIT: To be scheduled as needed - pt has contact information, encouraged to contact with nutrition questions/concerns

## 2022-12-10 ENCOUNTER — Other Ambulatory Visit: Payer: Self-pay

## 2022-12-13 ENCOUNTER — Other Ambulatory Visit: Payer: Self-pay | Admitting: Hematology

## 2022-12-13 ENCOUNTER — Other Ambulatory Visit (HOSPITAL_COMMUNITY): Payer: Self-pay

## 2022-12-13 ENCOUNTER — Other Ambulatory Visit: Payer: Self-pay

## 2022-12-13 DIAGNOSIS — C162 Malignant neoplasm of body of stomach: Secondary | ICD-10-CM

## 2022-12-13 MED ORDER — CAPECITABINE 500 MG PO TABS
ORAL_TABLET | ORAL | 0 refills | Status: DC
  Filled 2022-12-13: qty 98, 21d supply, fill #0

## 2022-12-18 ENCOUNTER — Other Ambulatory Visit: Payer: Self-pay

## 2022-12-18 ENCOUNTER — Other Ambulatory Visit (HOSPITAL_COMMUNITY): Payer: Self-pay

## 2022-12-18 NOTE — Assessment & Plan Note (Signed)
[id number] with peritoneal metastasis. 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   -she started first line chemo FLOT on 1/10 -She understands that chemotherapy is palliative, to prolong her life.  We are unlikely going to cure her cancer. -PD-L1 0-1%, no significant benefit from PD-L1 immunotherapy, FO revealed FGFR2 amplification and fusion (+), FGFR inhibitors can be considered in future, no other targeted therapy available  -She has been tolerating chemo very well, will continue for now  -PET scan from 05/30/2022 was negative for primary tumor or metastatic disease, the known peritoneal mets did not show on PET.  -she has been tolerating chemo well overall. Due to fatigue, I have changed her chemo from FLOT to FOLFOX on 06/20/2022, she tolerated well -she previously asked the role of surgery, depends on her next restaging CT scan findings, I may refer her to [facility] or [facility] to discuss HIPEC surgery  -due to her infusion reaction to oxaliplatin on C5, we added additional premeds and gave slow infusion over 4 hours for cycle 6 and she tolerated well  -She is not able to return to work due to the cancer and treatment related symptoms.  -She is tolerating FOLFOX well overall, with moderate fatigue for a few days after infusion but able to recover well.  No signs of neuropathy at this point. -Restaging CT abdomen pelvis from August 27, 2022 showed no residual disease.  -We again discussed maintenance therapy with Xeloda down the road, we will stop oxaliplatin when she develops side effects especially neuropathy, or after next scan -repeated staging CT from 11/26/2021 showed stable disease  -I have changed her treatment to maintenance Xeloda in early August 2024, she tolerated first cycle well overall.

## 2022-12-19 ENCOUNTER — Inpatient Hospital Stay: Payer: Medicaid Other | Admitting: Dietician

## 2022-12-19 ENCOUNTER — Other Ambulatory Visit (HOSPITAL_COMMUNITY): Payer: Self-pay

## 2022-12-19 ENCOUNTER — Other Ambulatory Visit: Payer: Self-pay

## 2022-12-19 ENCOUNTER — Inpatient Hospital Stay: Payer: Medicaid Other

## 2022-12-19 ENCOUNTER — Inpatient Hospital Stay (HOSPITAL_BASED_OUTPATIENT_CLINIC_OR_DEPARTMENT_OTHER): Payer: Medicaid Other | Admitting: Hematology

## 2022-12-19 ENCOUNTER — Encounter: Payer: Self-pay | Admitting: Hematology

## 2022-12-19 VITALS — BP 129/88 | HR 64 | Temp 98.4°F | Resp 18 | Wt 191.1 lb

## 2022-12-19 DIAGNOSIS — G620 Drug-induced polyneuropathy: Secondary | ICD-10-CM | POA: Diagnosis not present

## 2022-12-19 DIAGNOSIS — C162 Malignant neoplasm of body of stomach: Secondary | ICD-10-CM

## 2022-12-19 DIAGNOSIS — Z95828 Presence of other vascular implants and grafts: Secondary | ICD-10-CM

## 2022-12-19 DIAGNOSIS — C786 Secondary malignant neoplasm of retroperitoneum and peritoneum: Secondary | ICD-10-CM | POA: Diagnosis present

## 2022-12-19 DIAGNOSIS — D500 Iron deficiency anemia secondary to blood loss (chronic): Secondary | ICD-10-CM

## 2022-12-19 DIAGNOSIS — C169 Malignant neoplasm of stomach, unspecified: Secondary | ICD-10-CM | POA: Diagnosis present

## 2022-12-19 LAB — CMP (CANCER CENTER ONLY)
ALT: 22 U/L (ref 0–44)
AST: 27 U/L (ref 15–41)
Albumin: 4.2 g/dL (ref 3.5–5.0)
Alkaline Phosphatase: 104 U/L (ref 38–126)
Anion gap: 8 (ref 5–15)
BUN: 12 mg/dL (ref 6–20)
CO2: 29 mmol/L (ref 22–32)
Calcium: 9.1 mg/dL (ref 8.9–10.3)
Chloride: 104 mmol/L (ref 98–111)
Creatinine: 0.61 mg/dL (ref 0.44–1.00)
GFR, Estimated: >60 mL/min (ref >60)
Glucose, Bld: 130 mg/dL — ABNORMAL HIGH (ref 70–99)
Potassium: 3.5 mmol/L (ref 3.5–5.1)
Sodium: 141 mmol/L (ref 135–145)
Total Bilirubin: 0.6 mg/dL (ref 0.3–1.2)
Total Protein: 7.1 g/dL (ref 6.5–8.1)

## 2022-12-19 LAB — CBC WITH DIFFERENTIAL (CANCER CENTER ONLY)
Abs Immature Granulocytes: 0.01 10*3/uL (ref 0.00–0.07)
Basophils Absolute: 0 10*3/uL (ref 0.0–0.1)
Basophils Relative: 1 %
Eosinophils Absolute: 0.1 10*3/uL (ref 0.0–0.5)
Eosinophils Relative: 3 %
HCT: 36.9 % (ref 36.0–46.0)
Hemoglobin: 13.1 g/dL (ref 12.0–15.0)
Immature Granulocytes: 0 %
Lymphocytes Relative: 26 %
Lymphs Abs: 0.9 10*3/uL (ref 0.7–4.0)
MCH: 32.3 pg (ref 26.0–34.0)
MCHC: 35.5 g/dL (ref 30.0–36.0)
MCV: 90.9 fL (ref 80.0–100.0)
Monocytes Absolute: 0.4 10*3/uL (ref 0.1–1.0)
Monocytes Relative: 10 %
Neutro Abs: 2.2 10*3/uL (ref 1.7–7.7)
Neutrophils Relative %: 60 %
Platelet Count: 213 10*3/uL (ref 150–400)
RBC: 4.06 MIL/uL (ref 3.87–5.11)
RDW: 13.9 % (ref 11.5–15.5)
WBC Count: 3.6 10*3/uL — ABNORMAL LOW (ref 4.0–10.5)
nRBC: 0 % (ref 0.0–0.2)

## 2022-12-19 LAB — FERRITIN: Ferritin: 127 ng/mL (ref 11–307)

## 2022-12-19 MED ORDER — HEPARIN SOD (PORK) LOCK FLUSH 100 UNIT/ML IV SOLN
500.0000 [IU] | Freq: Once | INTRAVENOUS | Status: AC
  Administered 2022-12-19: 500 [IU]

## 2022-12-19 MED ORDER — SODIUM CHLORIDE 0.9% FLUSH
10.0000 mL | Freq: Once | INTRAVENOUS | Status: AC
  Administered 2022-12-19: 10 mL

## 2022-12-19 MED ORDER — CAPECITABINE 500 MG PO TABS
1000.0000 mg/m2 | ORAL_TABLET | Freq: Two times a day (BID) | ORAL | 0 refills | Status: DC
  Filled 2022-12-19: qty 112, 21d supply, fill #0

## 2022-12-19 NOTE — Progress Notes (Signed)
Nutrition Follow-up:  Pt with gastric cancer. She is currently receiving maintenance Xeloda (start 8/5).   Met with pt and her father following MD visit. Pt reports tolerating xeloda well. Plans to increase dose discussed today with Dr. Mosetta Putt. She denies diarrhea, constipation, nausea, vomiting. Appetite has been good overall. Pt has been trying new foods. Reports eating a steak/cheese (mayo, green/red peppers) from a restaurant last night. She was awakening in the night with abdominal pain. Pt will not eat this again.    Medications: reviewed   Labs: glucose 130  Anthropometrics: Wt 191 lb 1.6 oz today increased   7/31 - 189 lb 8 oz  7/3 - 192 lb 8 oz 6/19 - 191 lb 9.6 oz   NUTRITION DIAGNOSIS: Unintended wt loss improved     INTERVENTION:  Encouraged regular diet as tolerated  Include good sources of lean proteins with meals    MONITORING, EVALUATION, GOAL: weight trends, intake   NEXT VISIT: To be scheduled as needed

## 2022-12-19 NOTE — Progress Notes (Signed)
[facility]   [phone] Fax:([phone]) [phone]   Clinic Follow up Note   Patient Care Team: [name], MD as PCP - General (Internal Medicine) [name], MD as Consulting Physician (Oncology)  Date of Service:  12/19/2022  CHIEF COMPLAINT: f/u of gastric cancer   CURRENT THERAPY:  Xeloda, 4 tablets in AM and 4 tablets in PM   ASSESSMENT:  Sheryl [name] is a 57 y.o. 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   -she started first line chemo FLOT on 1/10 -She understands that chemotherapy is palliative, to prolong her life.  We are unlikely going to cure her cancer. -PD-L1 0-1%, no significant benefit from PD-L1 immunotherapy, FO revealed FGFR2 amplification and fusion (+), FGFR inhibitors can be considered in future, no other targeted therapy available  -She has been tolerating chemo very well, will continue for now  -PET scan from 05/30/2022 was negative for primary tumor or metastatic disease, the known peritoneal mets did not show on PET.  -she has been tolerating chemo well overall. Due to fatigue, I have changed her chemo from FLOT to FOLFOX on 06/20/2022, she tolerated well -she previously asked the role of surgery, depends on her next restaging CT scan findings, I may refer her to [facility] or [facility] to discuss HIPEC surgery  -due to her infusion reaction to oxaliplatin on C5, we added additional premeds and gave slow infusion over 4 hours for cycle 6 and she tolerated well  -She is not able to return to work due to the cancer and treatment related symptoms.  -She is tolerating FOLFOX well overall, with moderate fatigue for a few days after infusion but able to recover well.  No signs of neuropathy at this point. -Restaging  CT abdomen pelvis from August 27, 2022 showed no residual disease.  -We again discussed maintenance therapy with Xeloda down the road, we will stop oxaliplatin when she develops side effects especially neuropathy, or after next scan -repeated staging CT from 11/26/2021 showed stable disease  -I have changed her treatment to maintenance Xeloda in early August 2024, she tolerated first cycle well overall.   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  PLAN: -lab reviewed WBC - 3.6 -CMP-pending -continue Xeloda , will increase to 4 tablets in AM and 4 tablet in PM for 14 days from next cycle which she starts next Monday  -lab/flush and f/u in 9/11   SUMMARY OF ONCOLOGIC HISTORY: Oncology History Overview Note   Cancer Staging  Gastric cancer [facility]) Staging form: Stomach, AJCC 8th Edition - Clinical stage from 04/19/2022: Stage IVB (cT2, cN0, pM1) - Signed by [name], MD on 05/08/2022 Total positive nodes: 0     Gastric cancer (HCC)  03/30/2022 Procedure   EGD:  Impression:  - Normal esophagus. - A few gastric polyps. Biopsied. - Gastritis. Biopsied. - Non-bleeding gastric ulcer with no stigmata of bleeding. Biopsied. - Normal examined duodenum. Biopsied.  Findings: Diffuse moderate inflammation characterized by congestion (edema), friability and granularity was found in the cardia, in the gastric fundus and in the gastric body. There were associated erosions in multiple places. Biopsies were taken from the antrum, body, and fundus with a cold forceps for histology. Estimated blood loss was minimal.  One non-bleeding cratered gastric ulcer  with no stigmata of bleeding was found on the greater curvature of the stomach. The lesion was 6 mm in largest dimension. The mucosa around the ulcer was heaped and led to some deformity in the antrum. Biopsies were taken with a cold forceps for histology. Estimated blood loss was  minimal.    03/30/2022 Pathology Results   [name], Sheryl [name]  Accession: [id number]  Diagnosis 1. Surgical [P], duodenal - BENIGN SMALL BOWEL MUCOSA WITH NO SIGNIFICANT PATHOLOGIC CHANGES 2. Surgical [P], gastric antrum - GASTRIC ANTRAL MUCOSA WITH FEATURES OF REACTIVE GASTROPATHY - NEGATIVE FOR H. PYLORI ON H&E STAIN - NEGATIVE FOR INTESTINAL METAPLASIA OR MALIGNANCY 3. Surgical [P], gastric body - GASTRIC OXYNTIC MUCOSA WITH REACTIVE/REPARATIVE CHANGES - NEGATIVE FOR H. PYLORI ON H&E STAIN - NEGATIVE FOR INTESTINAL METAPLASIA, DYSPLASIA OR MALIGNANCY 4. Surgical [P], greater curve ulceration - ADENOCARCINOMA WITH SIGNET RING CELL FEATURES (SEE NOTE) 5. Surgical [P], gastric polyps - ADENOCARCINOMA WITH SIGNET RING CELL FEATURES (SEE NOTE) 6. Surgical [P], fundus (gastric) - ADENOCARCINOMA WITH SIGNET RING CELL FEATURES (SEE NOTE) 7. Surgical [P], colon, ascending, polyp (1) - TUBULAR ADENOMA. - NO HIGH GRADE DYSPLASIA OR MALIGNANCY. 8. Surgical [P], colon, transverse, polyp (1) - TUBULAR ADENOMA. - NO HIGH GRADE DYSPLASIA OR MALIGNANCY.    04/13/2022 Initial Diagnosis   Gastric cancer (HCC)   04/19/2022 Cancer Staging   Staging form: Stomach, AJCC 8th Edition - Clinical stage from 04/19/2022: Stage IVB (cT2, cN0, pM1) - Signed by [name], MD on 05/08/2022 Total positive nodes: 0   05/05/2022 Genetic Testing   Negative genetic testing on the Multi-cancer gene panel + RNA.  FH c.259C>T VUS identified.  The report date is May 05, 2022.  The Multi-Cancer + RNA Panel offered by [facility] includes sequencing and/or deletion/duplication analysis of the following 70 genes:  AIP*, ALK, APC*, ATM*, AXIN2*, BAP1*, BARD1*, BLM*, BMPR1A*, BRCA1*, BRCA2*, BRIP1*, CDC73*, CDH1*, CDK4, CDKN1B*, CDKN2A, CHEK2*, CTNNA1*, DICER1*, EPCAM (del/dup only), EGFR, FH*, FLCN*, GREM1 (promoter dup only), HOXB13, KIT, LZTR1, MAX*, MBD4, MEN1*, MET, MITF, MLH1*, MSH2*, MSH3*, MSH6*, MUTYH*,  NF1*, NF2*, NTHL1*, PALB2*, PDGFRA, PMS2*, POLD1*, POLE*, POT1*, PRKAR1A*, PTCH1*, PTEN*, RAD51C*, RAD51D*, RB1*, RET, SDHA* (sequencing only), SDHAF2*, SDHB*, SDHC*, SDHD*, SMAD4*, SMARCA4*, SMARCB1*, SMARCE1*, STK11*, SUFU*, TMEM127*, TP53*, TSC1*, TSC2*, VHL*. RNA analysis is performed for * genes.    05/09/2022 - 06/07/2022 Chemotherapy   Patient is on Treatment Plan : GASTROESOPHAGEAL FLOT q14d X 4 cycles      Miscellaneous   Foundation One  Biomarker Findings Microsatellite status- Cannot be determined Tumor Mutational Burden- Cannot be determined  Genomic Findings  FGFR2 amplification,FGFR2-TACC2 fusion,  Rearrangement intron 17 ARAF amplification CCND3 amplification TP53 V264fs*74     05/30/2022 Imaging    IMPRESSION: 1. Mild hypermetabolism corresponding to a dominant left upper quadrant mass and smaller perigastric nodules or nodes. 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 Recommend attention to the dominant left upper quadrant soft tissue mass on follow-up exams to exclude unlikely recurrent lymphoma. 2. No gastric hypermetabolism and no typical findings of metastatic disease.   06/20/2022 -  Chemotherapy   Patient is on Treatment Plan : GASTRIC FOLFOX q14d x 12 cycles     08/27/2022 Imaging    IMPRESSION: No focal gastric mass on CT.   No findings suspicious for recurrent or metastatic disease.   Stable left upper abdominal soft tissue lesion and small lymph nodes, chronic, favoring treated lymphoma.   11/27/2022 Imaging  IMPRESSION: 1. Questionable thickening of the distal esophagus/GE junction and gastric antrum, consider further evaluation with endoscopy. 2. Chronically stable left upper quadrant nodularity and prominent lymph nodes again favored treated lymphoma. Continued attention on follow-up imaging suggested. 3. No convincing evidence of metastatic disease in the chest, abdomen or pelvis. 4. Questionable  asymmetric wall thickening of the rectum, consider further evaluation with colonoscopy. 5. Mild wall thickening of a nondistended urinary bladder, correlate with urinalysis to exclude cystitis. 6. Hepatic steatosis.      INTERVAL HISTORY:  Sheryl [name] is here for a follow up of gastric cancer . She was last seen by NP Heather on 12/07/2022. She presents to the clinic accompanied by her father. Pt state that she has no issues since starting Xeloda. Pt state that she still has some neuropathy from previous treatment, but overall clinically doing well.    All other systems were reviewed with the patient and are negative.  MEDICAL HISTORY:  Past Medical History:  Diagnosis Date   Blood transfusion without reported diagnosis    had transfusion with hysterectomy   Cataract    Colon polyps 2012   Diabetes (HCC) 03/13/2021   Diabetes (HCC) 05/21/2019   Family history of breast cancer    Family history of pancreatic cancer    Family history of stomach cancer    Fibroid    gastric ca 03/2022   GERD (gastroesophageal reflux disease)    H/O blood clots    History of hysterectomy    fibroids and heavy cycles   Hypertension     SURGICAL HISTORY: Past Surgical History:  Procedure Laterality Date   ABDOMINAL HYSTERECTOMY     BIOPSY  04/19/2022   Procedure: BIOPSY;  Surgeon: Lemar Lofty., MD;  Location: [facility];  Service: Gastroenterology;;   COLONOSCOPY     ESOPHAGOGASTRODUODENOSCOPY (EGD) WITH PROPOFOL N/A 04/19/2022   Procedure: ESOPHAGOGASTRODUODENOSCOPY (EGD) WITH PROPOFOL;  Surgeon: Lemar Lofty., MD;  Location: [facility];  Service: Gastroenterology;  Laterality: N/A;   EUS N/A 04/19/2022   Procedure: UPPER ENDOSCOPIC ULTRASOUND (EUS) RADIAL;  Surgeon: Lemar Lofty., MD;  Location: [facility];  Service: Gastroenterology;  Laterality: N/A;   EXCISION OF SKIN TAG  05/03/2022   Procedure: EXCISION OF CHEST WALL SKIN LESION;  Surgeon: Fritzi Mandes, MD;  Location: [facility] OR;  Service: General;;   LAPAROSCOPY N/A 05/03/2022   Procedure: LAPAROSCOPY DIAGNOSTIC WITH PERITONEAL WASHINGS;  Surgeon: Fritzi Mandes, MD;  Location: [facility] OR;  Service: General;  Laterality: N/A;   POLYPECTOMY  04/19/2022   Procedure: POLYPECTOMY;  Surgeon: Lemar Lofty., MD;  Location: [facility];  Service: Gastroenterology;;   PORTACATH PLACEMENT N/A 05/03/2022   Procedure: INSERTION PORT-A-CATH WITH ULTRASOUND GUIDANCE;  Surgeon: Fritzi Mandes, MD;  Location: [facility] OR;  Service: General;  Laterality: N/A;   UPPER GASTROINTESTINAL ENDOSCOPY      I have reviewed the social history and family history with the patient and they are unchanged from previous note.  ALLERGIES:  is allergic to aspirin, cyclobenzaprine, naproxen sodium, zithromax [azithromycin dihydrate], oxaliplatin, and dilaudid [hydromorphone].  MEDICATIONS:  Current Outpatient Medications  Medication Sig Dispense Refill   acetaminophen (TYLENOL) 500 MG tablet Take 2 tablets (1,000 mg total) by mouth every 8 (eight) hours as needed (pain). 30 tablet 1   amLODipine (NORVASC) 10 MG tablet Take 1 tablet (10 mg total) by mouth daily. 90 tablet 1   Bacillus Coagulans-Inulin (PROBIOTIC-PREBIOTIC) 1-250 BILLION-MG CAPS Take 2 capsules by mouth daily. 30  capsule 1   Biotin 1000 MCG CHEW Chew 1,000 mcg by mouth daily. 30 tablet 2   Blood Glucose Monitoring Suppl (TRUE METRIX METER) w/Device KIT 1 kit by Does not apply route 3 (three) times daily as needed. 1 kit 0   capecitabine (XELODA) 500 MG tablet Take 4 tablets (2,000 mg total) by mouth 2 (two) times daily after a meal. Take within 30 minutes after meals. Take for 14 days on, then off for 7 days. Repeat every 21 days. 112 tablet 0   Cholecalciferol (VITAMIN D3) 125 MCG (5000 UT) TABS Take 1 tablet (5,000 Units total) by mouth daily. 30 tablet 2   diphenhydrAMINE-prednisoLONE-nystatin in lidocaine solution Take 5 mls by mouth 3 (three) times  daily as needed for mouth pain. 140 mL 0   famotidine (PEPCID) 20 MG tablet Take 1 tablet (20 mg total) by mouth 2 (two) times daily. 60 tablet 2   lidocaine-prilocaine (EMLA) cream Apply 1 Application topically as needed. 30 g 1   ondansetron (ZOFRAN-ODT) 4 MG disintegrating tablet Take 2 tablets (8 mg total) by mouth every 8 (eight) hours as needed for nausea or vomiting. Dissolve one tab on tongue 30 minutes before each dose of bowel prep 20 tablet 2   pantoprazole (PROTONIX) 40 MG tablet Take 1 tablet (40 mg total) by mouth 2 (two) times daily. (Patient not taking: Reported on 11/26/2022) 180 tablet 0   potassium chloride SA (KLOR-CON M) 20 MEQ tablet Take 1 tablet (20 mEq total) by mouth 2 (two) times daily. Change to once daily after 5 days, until finish 30 tablet 0   prochlorperazine (COMPAZINE) 10 MG tablet Take 1 tablet (10 mg total) by mouth every 6 (six) hours as needed for nausea or vomiting. 30 tablet 1   rosuvastatin (CRESTOR) 5 MG tablet Take 1 tablet (5 mg total) by mouth daily. (Patient not taking: Reported on 04/18/2022) 90 tablet 1   sucralfate (CARAFATE) 1 g tablet Take 1 tablet (1 g total) by mouth 2 (two) times daily. 60 tablet 1   Current Facility-Administered Medications  Medication Dose Route Frequency Provider Last Rate Last Admin   0.9 %  sodium chloride infusion  500 mL Intravenous Continuous Tressia Danas, MD        PHYSICAL EXAMINATION: ECOG PERFORMANCE STATUS: 1 - Symptomatic but completely ambulatory  Vitals:   12/19/22 0845  BP: 129/88  Pulse: 64  Resp: 18  Temp: 98.4 F (36.9 C)  SpO2: 100%   Wt Readings from Last 3 Encounters:  12/19/22 191 lb 1.6 oz (86.7 kg)  11/28/22 189 lb 8 oz (86 kg)  11/14/22 188 lb 1.6 oz (85.3 kg)     GENERAL:alert, no distress and comfortable SKIN: skin color normal, no rashes or significant lesions EYES: normal, Conjunctiva are pink and non-injected, sclera clear  NEURO: alert & oriented x 3 with fluent  speech  LABORATORY DATA:  I have reviewed the data as listed    Latest Ref Rng & Units 12/19/2022    8:02 AM 11/28/2022    8:08 AM 11/14/2022    9:00 AM  CBC  WBC 4.0 - 10.5 K/uL 3.6  3.8  5.5   Hemoglobin 12.0 - 15.0 g/dL 40.9  81.1  91.4   Hematocrit 36.0 - 46.0 % 36.9  38.1  39.7   Platelets 150 - 400 K/uL 213  257  236         Latest Ref Rng & Units 12/19/2022    8:02 AM 11/28/2022  8:08 AM 11/14/2022    9:00 AM  CMP  Glucose 70 - 99 mg/dL 295  621  89   BUN 6 - 20 mg/dL 12  9  12    Creatinine 0.44 - 1.00 mg/dL 3.08  6.57  8.46   Sodium 135 - 145 mmol/L 141  141  140   Potassium 3.5 - 5.1 mmol/L 3.5  3.5  3.6   Chloride 98 - 111 mmol/L 104  104  104   CO2 22 - 32 mmol/L 29  29  28    Calcium 8.9 - 10.3 mg/dL 9.1  9.5  9.7   Total Protein 6.5 - 8.1 g/dL 7.1  7.3  7.5   Total Bilirubin 0.3 - 1.2 mg/dL 0.6  0.9  0.8   Alkaline Phos 38 - 126 U/L 104  101  131   AST 15 - 41 U/L 27  25  26    ALT 0 - 44 U/L 22  19  19        RADIOGRAPHIC STUDIES: I have personally reviewed the radiological images as listed and agreed with the findings in the report. No results found.    No orders of the defined types were placed in this encounter.  All questions were answered. The patient knows to call the clinic with any problems, questions or concerns. No barriers to learning was detected. The total time spent in the appointment was 25 minutes.     [name], MD 12/19/2022   Carolin Coy, CMA, am acting as scribe for [name], MD.   I have reviewed the above documentation for accuracy and completeness, and I agree with the above.

## 2022-12-20 ENCOUNTER — Other Ambulatory Visit: Payer: Self-pay

## 2022-12-26 ENCOUNTER — Telehealth: Payer: Self-pay

## 2022-12-26 ENCOUNTER — Other Ambulatory Visit: Payer: Self-pay

## 2022-12-26 NOTE — Telephone Encounter (Signed)
Pt called c/o a burning sensation in her esophagus and stomach since Thursday, 12/20/2022.  Pt stated she's currently taking her Capecitabine (dose was increased this cycle per pt) which her new cycle started on Monday, 12/24/2022.  Pt stated the burning sensation started before the dose increase.  Pt stated she's not eating anything that is high in fat or dairy.  Pt stated she's taking Famotidine 20mg  BID as prescribed.  Pt denied n/v, heartburn, acid reflux, and gas.  Pt stated she's having regular bowel movements.  Pt denied taking the Protonix d/t pt was told not to take while on Capecitabine.  Pt denied taking Sucralfate as well.  Recommended the pt try Tums as well.  Stated this nurse will notify Dr. Mosetta Putt and staff of pt's symptoms and will f/u with their recommendations.  Pt verbalized understanding and had no further questions or concerns at this time.

## 2022-12-28 ENCOUNTER — Other Ambulatory Visit: Payer: Self-pay

## 2023-01-02 ENCOUNTER — Other Ambulatory Visit (HOSPITAL_COMMUNITY): Payer: Self-pay

## 2023-01-02 ENCOUNTER — Other Ambulatory Visit: Payer: Self-pay

## 2023-01-02 ENCOUNTER — Other Ambulatory Visit: Payer: Self-pay | Admitting: Hematology

## 2023-01-02 DIAGNOSIS — C162 Malignant neoplasm of body of stomach: Secondary | ICD-10-CM

## 2023-01-02 MED ORDER — CAPECITABINE 500 MG PO TABS
1000.0000 mg/m2 | ORAL_TABLET | Freq: Two times a day (BID) | ORAL | 0 refills | Status: DC
  Filled 2023-01-02: qty 112, 21d supply, fill #0

## 2023-01-09 ENCOUNTER — Inpatient Hospital Stay: Payer: Medicaid Other | Attending: Physician Assistant

## 2023-01-09 ENCOUNTER — Inpatient Hospital Stay (HOSPITAL_BASED_OUTPATIENT_CLINIC_OR_DEPARTMENT_OTHER): Payer: Medicaid Other | Admitting: Hematology

## 2023-01-09 VITALS — BP 138/89 | HR 74 | Temp 97.9°F | Resp 16 | Ht 68.0 in | Wt 193.4 lb

## 2023-01-09 DIAGNOSIS — C162 Malignant neoplasm of body of stomach: Secondary | ICD-10-CM

## 2023-01-09 DIAGNOSIS — Z95828 Presence of other vascular implants and grafts: Secondary | ICD-10-CM

## 2023-01-09 DIAGNOSIS — C169 Malignant neoplasm of stomach, unspecified: Secondary | ICD-10-CM | POA: Insufficient documentation

## 2023-01-09 DIAGNOSIS — Z79899 Other long term (current) drug therapy: Secondary | ICD-10-CM | POA: Diagnosis not present

## 2023-01-09 DIAGNOSIS — C786 Secondary malignant neoplasm of retroperitoneum and peritoneum: Secondary | ICD-10-CM | POA: Insufficient documentation

## 2023-01-09 LAB — CMP (CANCER CENTER ONLY)
ALT: 29 U/L (ref 0–44)
AST: 35 U/L (ref 15–41)
Albumin: 4.4 g/dL (ref 3.5–5.0)
Alkaline Phosphatase: 110 U/L (ref 38–126)
Anion gap: 6 (ref 5–15)
BUN: 10 mg/dL (ref 6–20)
CO2: 30 mmol/L (ref 22–32)
Calcium: 9.7 mg/dL (ref 8.9–10.3)
Chloride: 104 mmol/L (ref 98–111)
Creatinine: 0.64 mg/dL (ref 0.44–1.00)
GFR, Estimated: >60 mL/min (ref >60)
Glucose, Bld: 102 mg/dL — ABNORMAL HIGH (ref 70–99)
Potassium: 3.3 mmol/L — ABNORMAL LOW (ref 3.5–5.1)
Sodium: 140 mmol/L (ref 135–145)
Total Bilirubin: 1 mg/dL (ref 0.3–1.2)
Total Protein: 7.2 g/dL (ref 6.5–8.1)

## 2023-01-09 LAB — CBC WITH DIFFERENTIAL (CANCER CENTER ONLY)
Abs Immature Granulocytes: 0.01 10*3/uL (ref 0.00–0.07)
Basophils Absolute: 0 10*3/uL (ref 0.0–0.1)
Basophils Relative: 1 %
Eosinophils Absolute: 0.1 10*3/uL (ref 0.0–0.5)
Eosinophils Relative: 2 %
HCT: 38.4 % (ref 36.0–46.0)
Hemoglobin: 13.7 g/dL (ref 12.0–15.0)
Immature Granulocytes: 0 %
Lymphocytes Relative: 28 %
Lymphs Abs: 1 10*3/uL (ref 0.7–4.0)
MCH: 32.9 pg (ref 26.0–34.0)
MCHC: 35.7 g/dL (ref 30.0–36.0)
MCV: 92.1 fL (ref 80.0–100.0)
Monocytes Absolute: 0.4 10*3/uL (ref 0.1–1.0)
Monocytes Relative: 11 %
Neutro Abs: 2.1 10*3/uL (ref 1.7–7.7)
Neutrophils Relative %: 58 %
Platelet Count: 225 10*3/uL (ref 150–400)
RBC: 4.17 MIL/uL (ref 3.87–5.11)
RDW: 15.6 % — ABNORMAL HIGH (ref 11.5–15.5)
WBC Count: 3.7 10*3/uL — ABNORMAL LOW (ref 4.0–10.5)
nRBC: 0 % (ref 0.0–0.2)

## 2023-01-09 LAB — FERRITIN: Ferritin: 138 ng/mL (ref 11–307)

## 2023-01-09 MED ORDER — HEPARIN SOD (PORK) LOCK FLUSH 100 UNIT/ML IV SOLN
500.0000 [IU] | Freq: Once | INTRAVENOUS | Status: AC
  Administered 2023-01-09: 500 [IU]

## 2023-01-09 MED ORDER — SODIUM CHLORIDE 0.9% FLUSH
10.0000 mL | Freq: Once | INTRAVENOUS | Status: AC
  Administered 2023-01-09: 10 mL

## 2023-01-09 NOTE — Progress Notes (Signed)
[facility]   [phone]) [phone] [phone]   Clinic Follow up Note   Patient Care Team: [name], MD as PCP - General (Internal Medicine) [name], MD as Consulting Physician (Oncology)  Date of Service:  01/09/2023  CHIEF COMPLAINT: f/u of gastric cancer   CURRENT THERAPY:  Xeloda, 4 tablets in AM and 4 tablets in PM   2 weeks on and 1 week off.  ASSESSMENT:  Sheryl [name] is a 57 y.o. 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   -she started first line chemo FLOT on 1/10 -She understands that chemotherapy is palliative, to prolong her life.  We are unlikely going to cure her cancer. -PD-L1 0-1%, no significant benefit from PD-L1 immunotherapy, FO revealed FGFR2 amplification and fusion (+), FGFR inhibitors can be considered in future, no other targeted therapy available  -She has been tolerating chemo very well, will continue for now  -PET scan from 05/30/2022 was negative for primary tumor or metastatic disease, the known peritoneal mets did not show on PET.  -she has been tolerating chemo well overall. Due to fatigue, I have changed her chemo from FLOT to FOLFOX on 06/20/2022, she tolerated well -she previously asked the role of surgery, depends on her next restaging CT scan findings, I may refer her to [facility] or [facility] to discuss HIPEC surgery  -due to her infusion reaction to oxaliplatin on C5, we added additional premeds and gave slow infusion over 4 hours for cycle 6 and she tolerated well  -She is not able to return to work due to the cancer and treatment related symptoms.  -She is tolerating FOLFOX well overall, with moderate fatigue for a few days after infusion but able to recover well.  No signs of  neuropathy at this point. -Restaging CT abdomen pelvis from August 27, 2022 showed no residual disease.  -We again discussed maintenance therapy with Xeloda down the road, we will stop oxaliplatin when she develops side effects especially neuropathy, or after next scan -repeated staging CT from 11/26/2021 showed stable disease  -I have changed her treatment to maintenance Xeloda in early August 2024, she is tolerating well overall  -Patient reports mild discomfort in her esophagus and stomach area after she ate a cheeseburger yesterday, no nausea or vomiting, no issues with other food.  Possible related to acid reflux, she was on pantoprazole before but was switched to Pepcid due to interaction with Xeloda.  Will monitoring.    PLAN: -continue Xeloda -I will repeat CT scan in November, I will order in October -lab reviewed -CMP- pending -lab/flush and f/u  3 weeks -Plan to repeat a restaging scan in early November   SUMMARY OF ONCOLOGIC HISTORY: Oncology History Overview Note   Cancer Staging  Gastric cancer [facility]) Staging form: Stomach, AJCC 8th Edition - Clinical stage from 04/19/2022: Stage IVB (cT2, cN0, pM1) - Signed by [name], MD on 05/08/2022 Total positive nodes: 0     Gastric cancer (HCC)  03/30/2022 Procedure   EGD:  Impression:  - Normal esophagus. - A few gastric polyps. Biopsied. - Gastritis. Biopsied. - Non-bleeding gastric ulcer with no stigmata of bleeding. Biopsied. - Normal examined duodenum. Biopsied.  Findings: Diffuse moderate inflammation characterized by congestion (edema), friability and granularity was found in the cardia, in the  gastric fundus and in the gastric body. There were associated erosions in multiple places. Biopsies were taken from the antrum, body, and fundus with a cold forceps for histology. Estimated blood loss was minimal.  One non-bleeding cratered gastric ulcer with no stigmata of bleeding was found on the greater curvature of the  stomach. The lesion was 6 mm in largest dimension. The mucosa around the ulcer was heaped and led to some deformity in the antrum. Biopsies were taken with a cold forceps for histology. Estimated blood loss was minimal.    03/30/2022 Pathology Results   [name]  Accession: [id number]  Diagnosis 1. Surgical [P], duodenal - BENIGN SMALL BOWEL MUCOSA WITH NO SIGNIFICANT PATHOLOGIC CHANGES 2. Surgical [P], gastric antrum - GASTRIC ANTRAL MUCOSA WITH FEATURES OF REACTIVE GASTROPATHY - NEGATIVE FOR H. PYLORI ON H&E STAIN - NEGATIVE FOR INTESTINAL METAPLASIA OR MALIGNANCY 3. Surgical [P], gastric body - GASTRIC OXYNTIC MUCOSA WITH REACTIVE/REPARATIVE CHANGES - NEGATIVE FOR H. PYLORI ON H&E STAIN - NEGATIVE FOR INTESTINAL METAPLASIA, DYSPLASIA OR MALIGNANCY 4. Surgical [P], greater curve ulceration - ADENOCARCINOMA WITH SIGNET RING CELL FEATURES (SEE NOTE) 5. Surgical [P], gastric polyps - ADENOCARCINOMA WITH SIGNET RING CELL FEATURES (SEE NOTE) 6. Surgical [P], fundus (gastric) - ADENOCARCINOMA WITH SIGNET RING CELL FEATURES (SEE NOTE) 7. Surgical [P], colon, ascending, polyp (1) - TUBULAR ADENOMA. - NO HIGH GRADE DYSPLASIA OR MALIGNANCY. 8. Surgical [P], colon, transverse, polyp (1) - TUBULAR ADENOMA. - NO HIGH GRADE DYSPLASIA OR MALIGNANCY.    04/13/2022 Initial Diagnosis   Gastric cancer (HCC)   04/19/2022 Cancer Staging   Staging form: Stomach, AJCC 8th Edition - Clinical stage from 04/19/2022: Stage IVB (cT2, cN0, pM1) - Signed by [name], MD on 05/08/2022 Total positive nodes: 0   05/05/2022 Genetic Testing   Negative genetic testing on the Multi-cancer gene panel + RNA.  FH c.259C>T VUS identified.  The report date is May 05, 2022.  The Multi-Cancer + RNA Panel offered by [facility] includes sequencing and/or deletion/duplication analysis of the following 70 genes:  AIP*, ALK, APC*, ATM*, AXIN2*, BAP1*, BARD1*, BLM*, BMPR1A*, BRCA1*, BRCA2*, BRIP1*,  CDC73*, CDH1*, CDK4, CDKN1B*, CDKN2A, CHEK2*, CTNNA1*, DICER1*, EPCAM (del/dup only), EGFR, FH*, FLCN*, GREM1 (promoter dup only), HOXB13, KIT, LZTR1, MAX*, MBD4, MEN1*, MET, MITF, MLH1*, MSH2*, MSH3*, MSH6*, MUTYH*, NF1*, NF2*, NTHL1*, PALB2*, PDGFRA, PMS2*, POLD1*, POLE*, POT1*, PRKAR1A*, PTCH1*, PTEN*, RAD51C*, RAD51D*, RB1*, RET, SDHA* (sequencing only), SDHAF2*, SDHB*, SDHC*, SDHD*, SMAD4*, SMARCA4*, SMARCB1*, SMARCE1*, STK11*, SUFU*, TMEM127*, TP53*, TSC1*, TSC2*, VHL*. RNA analysis is performed for * genes.    05/09/2022 - 06/07/2022 Chemotherapy   Patient is on Treatment Plan : GASTROESOPHAGEAL FLOT q14d X 4 cycles      Miscellaneous   Foundation One  Biomarker Findings Microsatellite status- Cannot be determined Tumor Mutational Burden- Cannot be determined  Genomic Findings  FGFR2 amplification,FGFR2-TACC2 fusion,  Rearrangement intron 17 ARAF amplification CCND3 amplification TP53 V254fs*74     05/30/2022 Imaging    IMPRESSION: 1. Mild hypermetabolism corresponding to a dominant left upper quadrant mass and smaller perigastric nodules or nodes. 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 Recommend attention to the dominant left upper quadrant soft tissue mass on follow-up exams to exclude unlikely recurrent lymphoma. 2. No gastric hypermetabolism and no typical findings of metastatic disease.   06/20/2022 -  Chemotherapy   Patient is on Treatment Plan : GASTRIC FOLFOX q14d x 12 cycles     08/27/2022 Imaging    IMPRESSION:  No focal gastric mass on CT.   No findings suspicious for recurrent or metastatic disease.   Stable left upper abdominal soft tissue lesion and small lymph nodes, chronic, favoring treated lymphoma.   11/27/2022 Imaging    IMPRESSION: 1. Questionable thickening of the distal esophagus/GE junction and gastric antrum, consider further evaluation with endoscopy. 2. Chronically stable left upper quadrant nodularity  and prominent lymph nodes again favored treated lymphoma. Continued attention on follow-up imaging suggested. 3. No convincing evidence of metastatic disease in the chest, abdomen or pelvis. 4. Questionable asymmetric wall thickening of the rectum, consider further evaluation with colonoscopy. 5. Mild wall thickening of a nondistended urinary bladder, correlate with urinalysis to exclude cystitis. 6. Hepatic steatosis.      INTERVAL HISTORY:  Sheryl [name] is here for a follow up of gastric cancer . She was last seen by me on 12/19/2022. She presents to the clinic accompanied by her father. Pt state that she feels a burning sensation in her esophagus. She state that something feels stuck. Pt state that she is doing well with the Xeloda. Pt state that she has some numbness in her feet and and some in her hands , but its tolerable. Pt has some peeling on her feet and some dark in color on her hands.       All other systems were reviewed with the patient and are negative.  MEDICAL HISTORY:  Past Medical History:  Diagnosis Date   Blood transfusion without reported diagnosis    had transfusion with hysterectomy   Cataract    Colon polyps 2012   Diabetes (HCC) 03/13/2021   Diabetes (HCC) 05/21/2019   Family history of breast cancer    Family history of pancreatic cancer    Family history of stomach cancer    Fibroid    gastric ca 03/2022   GERD (gastroesophageal reflux disease)    H/O blood clots    History of hysterectomy    fibroids and heavy cycles   Hypertension     SURGICAL HISTORY: Past Surgical History:  Procedure Laterality Date   ABDOMINAL HYSTERECTOMY     BIOPSY  04/19/2022   Procedure: BIOPSY;  Surgeon: Lemar Lofty., MD;  Location: [facility];  Service: Gastroenterology;;   COLONOSCOPY     ESOPHAGOGASTRODUODENOSCOPY (EGD) WITH PROPOFOL N/A 04/19/2022   Procedure: ESOPHAGOGASTRODUODENOSCOPY (EGD) WITH PROPOFOL;  Surgeon: Lemar Lofty.,  MD;  Location: [facility];  Service: Gastroenterology;  Laterality: N/A;   EUS N/A 04/19/2022   Procedure: UPPER ENDOSCOPIC ULTRASOUND (EUS) RADIAL;  Surgeon: Lemar Lofty., MD;  Location: [facility];  Service: Gastroenterology;  Laterality: N/A;   EXCISION OF SKIN TAG  05/03/2022   Procedure: EXCISION OF CHEST WALL SKIN LESION;  Surgeon: Fritzi Mandes, MD;  Location: [facility] OR;  Service: General;;   LAPAROSCOPY N/A 05/03/2022   Procedure: LAPAROSCOPY DIAGNOSTIC WITH PERITONEAL WASHINGS;  Surgeon: Fritzi Mandes, MD;  Location: [facility] OR;  Service: General;  Laterality: N/A;   POLYPECTOMY  04/19/2022   Procedure: POLYPECTOMY;  Surgeon: Lemar Lofty., MD;  Location: [facility];  Service: Gastroenterology;;   PORTACATH PLACEMENT N/A 05/03/2022   Procedure: INSERTION PORT-A-CATH WITH ULTRASOUND GUIDANCE;  Surgeon: Fritzi Mandes, MD;  Location: [facility] OR;  Service: General;  Laterality: N/A;   UPPER GASTROINTESTINAL ENDOSCOPY      I have reviewed the social history and family history with the patient and they are unchanged from previous note.  ALLERGIES:  is allergic to aspirin, cyclobenzaprine, naproxen  sodium, zithromax [azithromycin dihydrate], oxaliplatin, and dilaudid [hydromorphone].  MEDICATIONS:  Current Outpatient Medications  Medication Sig Dispense Refill   acetaminophen (TYLENOL) 500 MG tablet Take 2 tablets (1,000 mg total) by mouth every 8 (eight) hours as needed (pain). 30 tablet 1   amLODipine (NORVASC) 10 MG tablet Take 1 tablet (10 mg total) by mouth daily. 90 tablet 1   Bacillus Coagulans-Inulin (PROBIOTIC-PREBIOTIC) 1-250 BILLION-MG CAPS Take 2 capsules by mouth daily. 30 capsule 1   Biotin 1000 MCG CHEW Chew 1,000 mcg by mouth daily. 30 tablet 2   Blood Glucose Monitoring Suppl (TRUE METRIX METER) w/Device KIT 1 kit by Does not apply route 3 (three) times daily as needed. 1 kit 0   capecitabine (XELODA) 500 MG tablet Take 4 tablets (2,000 mg total) by mouth 2  (two) times daily after a meal. Take within 30 minutes after meals. Take for 14 days on, then off for 7 days. Repeat every 21 days. 112 tablet 0   Cholecalciferol (VITAMIN D3) 125 MCG (5000 UT) TABS Take 1 tablet (5,000 Units total) by mouth daily. 30 tablet 2   diphenhydrAMINE-prednisoLONE-nystatin in lidocaine solution Take 5 mls by mouth 3 (three) times daily as needed for mouth pain. 140 mL 0   famotidine (PEPCID) 20 MG tablet Take 1 tablet (20 mg total) by mouth 2 (two) times daily. 60 tablet 2   lidocaine-prilocaine (EMLA) cream Apply 1 Application topically as needed. 30 g 1   ondansetron (ZOFRAN-ODT) 4 MG disintegrating tablet Take 2 tablets (8 mg total) by mouth every 8 (eight) hours as needed for nausea or vomiting. Dissolve one tab on tongue 30 minutes before each dose of bowel prep 20 tablet 2   pantoprazole (PROTONIX) 40 MG tablet Take 1 tablet (40 mg total) by mouth 2 (two) times daily. (Patient not taking: Reported on 11/26/2022) 180 tablet 0   potassium chloride SA (KLOR-CON M) 20 MEQ tablet Take 1 tablet (20 mEq total) by mouth 2 (two) times daily. Change to once daily after 5 days, until finish 30 tablet 0   prochlorperazine (COMPAZINE) 10 MG tablet Take 1 tablet (10 mg total) by mouth every 6 (six) hours as needed for nausea or vomiting. 30 tablet 1   rosuvastatin (CRESTOR) 5 MG tablet Take 1 tablet (5 mg total) by mouth daily. (Patient not taking: Reported on 04/18/2022) 90 tablet 1   sucralfate (CARAFATE) 1 g tablet Take 1 tablet (1 g total) by mouth 2 (two) times daily. 60 tablet 1   Current Facility-Administered Medications  Medication Dose Route Frequency Provider Last Rate Last Admin   0.9 %  sodium chloride infusion  500 mL Intravenous Continuous Tressia Danas, MD        PHYSICAL EXAMINATION: ECOG PERFORMANCE STATUS: 1 - Symptomatic but completely ambulatory  Vitals:   01/09/23 0842  BP: 138/89  Pulse: 74  Resp: 16  Temp: 97.9 F (36.6 C)  SpO2: 100%   Wt  Readings from Last 3 Encounters:  01/09/23 193 lb 6.4 oz (87.7 kg)  12/19/22 191 lb 1.6 oz (86.7 kg)  11/28/22 189 lb 8 oz (86 kg)     GENERAL:alert, no distress and comfortable SKIN: skin color normal, no rashes or significant lesions EYES: normal, Conjunctiva are pink and non-injected, sclera clear  NEURO: alert & oriented x 3 with fluent speech   LABORATORY DATA:  I have reviewed the data as listed    Latest Ref Rng & Units 01/09/2023    8:20 AM 12/19/2022  8:02 AM 11/28/2022    8:08 AM  CBC  WBC 4.0 - 10.5 K/uL 3.7  3.6  3.8   Hemoglobin 12.0 - 15.0 g/dL 16.1  09.6  04.5   Hematocrit 36.0 - 46.0 % 38.4  36.9  38.1   Platelets 150 - 400 K/uL 225  213  257         Latest Ref Rng & Units 12/19/2022    8:02 AM 11/28/2022    8:08 AM 11/14/2022    9:00 AM  CMP  Glucose 70 - 99 mg/dL 409  811  89   BUN 6 - 20 mg/dL 12  9  12    Creatinine 0.44 - 1.00 mg/dL 9.14  7.82  9.56   Sodium 135 - 145 mmol/L 141  141  140   Potassium 3.5 - 5.1 mmol/L 3.5  3.5  3.6   Chloride 98 - 111 mmol/L 104  104  104   CO2 22 - 32 mmol/L 29  29  28    Calcium 8.9 - 10.3 mg/dL 9.1  9.5  9.7   Total Protein 6.5 - 8.1 g/dL 7.1  7.3  7.5   Total Bilirubin 0.3 - 1.2 mg/dL 0.6  0.9  0.8   Alkaline Phos 38 - 126 U/L 104  101  131   AST 15 - 41 U/L 27  25  26    ALT 0 - 44 U/L 22  19  19        RADIOGRAPHIC STUDIES: I have personally reviewed the radiological images as listed and agreed with the findings in the report. No results found.    No orders of the defined types were placed in this encounter.  All questions were answered. The patient knows to call the clinic with any problems, questions or concerns. No barriers to learning was detected. The total time spent in the appointment was 25 minutes.     [name], MD 01/09/2023   Carolin Coy, CMA, am acting as scribe for [name], MD.   I have reviewed the above documentation for accuracy and completeness, and I agree with the above.

## 2023-01-09 NOTE — Assessment & Plan Note (Signed)
[id number] with peritoneal metastasis. 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   -she started first line chemo FLOT on 1/10 -She understands that chemotherapy is palliative, to prolong her life.  We are unlikely going to cure her cancer. -PD-L1 0-1%, no significant benefit from PD-L1 immunotherapy, FO revealed FGFR2 amplification and fusion (+), FGFR inhibitors can be considered in future, no other targeted therapy available  -She has been tolerating chemo very well, will continue for now  -PET scan from 05/30/2022 was negative for primary tumor or metastatic disease, the known peritoneal mets did not show on PET.  -she has been tolerating chemo well overall. Due to fatigue, I have changed her chemo from FLOT to FOLFOX on 06/20/2022, she tolerated well -she previously asked the role of surgery, depends on her next restaging CT scan findings, I may refer her to [facility] or [facility] to discuss HIPEC surgery  -due to her infusion reaction to oxaliplatin on C5, we added additional premeds and gave slow infusion over 4 hours for cycle 6 and she tolerated well  -She is not able to return to work due to the cancer and treatment related symptoms.  -She is tolerating FOLFOX well overall, with moderate fatigue for a few days after infusion but able to recover well.  No signs of neuropathy at this point. -Restaging CT abdomen pelvis from August 27, 2022 showed no residual disease.  -We again discussed maintenance therapy with Xeloda down the road, we will stop oxaliplatin when she develops side effects especially neuropathy, or after next scan -repeated staging CT from 11/26/2021 showed stable disease  -I have changed her treatment to maintenance Xeloda in early August 2024, she is tolerating well overall

## 2023-01-11 ENCOUNTER — Other Ambulatory Visit: Payer: Self-pay

## 2023-01-18 ENCOUNTER — Other Ambulatory Visit (HOSPITAL_COMMUNITY): Payer: Self-pay

## 2023-01-23 ENCOUNTER — Other Ambulatory Visit (HOSPITAL_COMMUNITY): Payer: Self-pay

## 2023-01-23 ENCOUNTER — Other Ambulatory Visit: Payer: Self-pay | Admitting: Gastroenterology

## 2023-01-23 ENCOUNTER — Other Ambulatory Visit: Payer: Self-pay

## 2023-01-23 ENCOUNTER — Other Ambulatory Visit: Payer: Self-pay | Admitting: Hematology

## 2023-01-23 ENCOUNTER — Other Ambulatory Visit (INDEPENDENT_AMBULATORY_CARE_PROVIDER_SITE_OTHER): Payer: Self-pay | Admitting: Internal Medicine

## 2023-01-23 DIAGNOSIS — Z760 Encounter for issue of repeat prescription: Secondary | ICD-10-CM

## 2023-01-23 DIAGNOSIS — C162 Malignant neoplasm of body of stomach: Secondary | ICD-10-CM

## 2023-01-23 DIAGNOSIS — I10 Essential (primary) hypertension: Secondary | ICD-10-CM

## 2023-01-23 MED ORDER — CAPECITABINE 500 MG PO TABS
1000.0000 mg/m2 | ORAL_TABLET | Freq: Two times a day (BID) | ORAL | 0 refills | Status: DC
Start: 2023-01-23 — End: 2023-02-12
  Filled 2023-01-23: qty 112, 21d supply, fill #0

## 2023-01-23 NOTE — Progress Notes (Signed)
Specialty Pharmacy Refill Coordination Note  Sheryl [name] is a 57 y.o. female contacted today regarding refills of specialty medication(s) Capecitabine .  Patient requested Delivery  on 01/31/23  to verified address [address]  [place] [address]   Medication will be filled on 01/30/23. Pending Refill.

## 2023-01-24 ENCOUNTER — Other Ambulatory Visit: Payer: Self-pay

## 2023-01-24 MED ORDER — SUCRALFATE 1 G PO TABS
1.0000 g | ORAL_TABLET | Freq: Two times a day (BID) | ORAL | 0 refills | Status: DC
  Filled 2023-01-24: qty 60, 30d supply, fill #0

## 2023-01-25 ENCOUNTER — Other Ambulatory Visit: Payer: Self-pay

## 2023-01-25 ENCOUNTER — Other Ambulatory Visit (INDEPENDENT_AMBULATORY_CARE_PROVIDER_SITE_OTHER): Payer: Self-pay | Admitting: Internal Medicine

## 2023-01-25 DIAGNOSIS — Z760 Encounter for issue of repeat prescription: Secondary | ICD-10-CM

## 2023-01-25 DIAGNOSIS — I10 Essential (primary) hypertension: Secondary | ICD-10-CM

## 2023-01-28 ENCOUNTER — Other Ambulatory Visit: Payer: Self-pay

## 2023-01-29 ENCOUNTER — Other Ambulatory Visit: Payer: Self-pay

## 2023-01-30 ENCOUNTER — Inpatient Hospital Stay (HOSPITAL_BASED_OUTPATIENT_CLINIC_OR_DEPARTMENT_OTHER): Payer: Medicaid Other | Admitting: Nurse Practitioner

## 2023-01-30 ENCOUNTER — Inpatient Hospital Stay: Payer: Medicaid Other | Attending: Physician Assistant

## 2023-01-30 ENCOUNTER — Other Ambulatory Visit: Payer: Self-pay

## 2023-01-30 VITALS — BP 137/93 | HR 73 | Temp 98.6°F | Resp 17 | Ht 68.0 in | Wt 197.9 lb

## 2023-01-30 DIAGNOSIS — C169 Malignant neoplasm of stomach, unspecified: Secondary | ICD-10-CM | POA: Insufficient documentation

## 2023-01-30 DIAGNOSIS — R1013 Epigastric pain: Secondary | ICD-10-CM | POA: Diagnosis not present

## 2023-01-30 DIAGNOSIS — I10 Essential (primary) hypertension: Secondary | ICD-10-CM

## 2023-01-30 DIAGNOSIS — C786 Secondary malignant neoplasm of retroperitoneum and peritoneum: Secondary | ICD-10-CM | POA: Diagnosis present

## 2023-01-30 DIAGNOSIS — C162 Malignant neoplasm of body of stomach: Secondary | ICD-10-CM

## 2023-01-30 DIAGNOSIS — Z95828 Presence of other vascular implants and grafts: Secondary | ICD-10-CM

## 2023-01-30 LAB — CMP (CANCER CENTER ONLY)
ALT: 26 U/L (ref 0–44)
AST: 31 U/L (ref 15–41)
Albumin: 4.2 g/dL (ref 3.5–5.0)
Alkaline Phosphatase: 113 U/L (ref 38–126)
Anion gap: 6 (ref 5–15)
BUN: 13 mg/dL (ref 6–20)
CO2: 30 mmol/L (ref 22–32)
Calcium: 9.7 mg/dL (ref 8.9–10.3)
Chloride: 103 mmol/L (ref 98–111)
Creatinine: 0.67 mg/dL (ref 0.44–1.00)
GFR, Estimated: >60 mL/min (ref >60)
Glucose, Bld: 118 mg/dL — ABNORMAL HIGH (ref 70–99)
Potassium: 3.5 mmol/L (ref 3.5–5.1)
Sodium: 139 mmol/L (ref 135–145)
Total Bilirubin: 1 mg/dL (ref 0.3–1.2)
Total Protein: 7 g/dL (ref 6.5–8.1)

## 2023-01-30 LAB — CBC WITH DIFFERENTIAL (CANCER CENTER ONLY)
Abs Immature Granulocytes: 0.02 10*3/uL (ref 0.00–0.07)
Basophils Absolute: 0 10*3/uL (ref 0.0–0.1)
Basophils Relative: 1 %
Eosinophils Absolute: 0.1 10*3/uL (ref 0.0–0.5)
Eosinophils Relative: 2 %
HCT: 36.7 % (ref 36.0–46.0)
Hemoglobin: 13.6 g/dL (ref 12.0–15.0)
Immature Granulocytes: 1 %
Lymphocytes Relative: 22 %
Lymphs Abs: 0.7 10*3/uL (ref 0.7–4.0)
MCH: 34.3 pg — ABNORMAL HIGH (ref 26.0–34.0)
MCHC: 37.1 g/dL — ABNORMAL HIGH (ref 30.0–36.0)
MCV: 92.4 fL (ref 80.0–100.0)
Monocytes Absolute: 0.4 10*3/uL (ref 0.1–1.0)
Monocytes Relative: 12 %
Neutro Abs: 1.9 10*3/uL (ref 1.7–7.7)
Neutrophils Relative %: 62 %
Platelet Count: 242 10*3/uL (ref 150–400)
RBC: 3.97 MIL/uL (ref 3.87–5.11)
RDW: 17 % — ABNORMAL HIGH (ref 11.5–15.5)
WBC Count: 3 10*3/uL — ABNORMAL LOW (ref 4.0–10.5)
nRBC: 0 % (ref 0.0–0.2)

## 2023-01-30 MED ORDER — HEPARIN SOD (PORK) LOCK FLUSH 100 UNIT/ML IV SOLN
500.0000 [IU] | Freq: Once | INTRAVENOUS | Status: AC
  Administered 2023-01-30: 500 [IU]

## 2023-01-30 MED ORDER — HYDROCODONE-ACETAMINOPHEN 5-325 MG PO TABS
1.0000 | ORAL_TABLET | Freq: Four times a day (QID) | ORAL | 0 refills | Status: DC | PRN
Start: 2023-01-30 — End: 2023-06-12
  Filled 2023-01-30: qty 10, 3d supply, fill #0

## 2023-01-30 MED ORDER — SODIUM CHLORIDE 0.9% FLUSH
10.0000 mL | Freq: Once | INTRAVENOUS | Status: AC
  Administered 2023-01-30: 10 mL

## 2023-01-30 MED ORDER — AMLODIPINE BESYLATE 10 MG PO TABS
10.0000 mg | ORAL_TABLET | Freq: Every day | ORAL | 0 refills | Status: DC
Start: 2023-01-30 — End: 2023-04-25
  Filled 2023-01-30 (×2): qty 90, 90d supply, fill #0

## 2023-01-30 NOTE — Assessment & Plan Note (Signed)
Patient had episode of epigastric pain, similar to pain she had prior to her cancer diagnosis.  -trial hydrocodone/APAP 5/325 mg tablets which may be taken as needed for severe pain. -repeat CT CAP in next 2 weeks.  -follow up and review scan in 3 weeks.

## 2023-01-30 NOTE — Progress Notes (Addendum)
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   -she started first line chemo FLOT on 1/10. Chemotherapy is palliative.  -PD-L1 0-1%, no significant benefit from PD-L1 immunotherapy, FO revealed FGFR2 amplification and fusion (+), -FGFR inhibitors can be considered in future, no other targeted therapy available  -She tolerated chemo very well. -PET scan from 05/30/2022 was negative for primary tumor or metastatic disease, the known peritoneal mets did not show on PET.  -Due to fatigue, chemotherapy was changed her chemo from FLOT to FOLFOX on 06/20/2022, she tolerated well -she previously asked the role of surgery, depends on her next restaging CT scan findings, with consider referral to [facility] or [facility] to discuss HIPEC surgery  -she did have infusion reaction to oxaliplatin on C5, additional premeds were added infusion slowed to over 4 hours for cycle 6 and she tolerated well   -She is tolerated FOLFOX well overall, with moderate fatigue for a few days after infusion but able to recover well.  No signs of neuropathy. -Restaging CT abdomen pelvis from August 27, 2022 showed no residual disease.  -repeated staging CT from 11/27/2022 showed stable disease  -Her treatment was changed to maintenance Xeloda in early August 2024, which she has tolerated well  -she did have episode of severe epigastric pain 01/28/2023, similar to pain she had prior to cancer diagnosis.  -new CT CAP to be scheduled in next 2 weeks  -labs and follow up in 3 weeks   Epigastric  pain Patient had episode of epigastric pain, similar to pain she had prior to her cancer diagnosis.  -trial hydrocodone/APAP 5/325 mg tablets which may be taken as needed for severe pain. -repeat CT CAP in next 2 weeks.  -follow up and review scan in 3 weeks.   Hypertension Refilled amlodipine until patient has appointment with primary care in November 2024.    Plan:  Labs reviewed  -CBC showing WBC 3.0; Hgb 13.6; Hct 36.7; Plt 242; Anc 1.9 -CMP - K 3.5; glucose 118; BUN 13; Creatinine 0.67; eGFR >60; Ca 9.7; LFTs normal.   Continue Xeloda 4 tablets in AM and 4 tablets in PM. Follow regimen 2 weeks on and 1 week off.  Repeat CT CAP in next 2 to 3 weeks.  Labs and follow up in 3 weeks to review.  Consider alternative treatments if disease progression.    The patient understands the plans discussed today and is in agreement with them.  She knows to contact our office if she develops concerns prior to her next appointment.  I provided 30 minutes of face-to-face time during this encounter and > 50% was spent counseling as documented under my assessment and plan.    Carlean Jews, NP  [facility] [address] [place] [address] Dept: [phone] Dept Fax: [phone]   Orders Placed This Encounter  Procedures   CT CHEST ABDOMEN PELVIS W CONTRAST    Standing Status:   Future    Standing Expiration Date:   01/30/2024    Order Specific Question:   If  indicated for the ordered procedure, I authorize the administration of contrast media per Radiology protocol    Answer:   Yes    Order Specific Question:   Does the patient have a contrast media/X-ray dye allergy?    Answer:   No    Order Specific Question:   Is patient pregnant?    Answer:   No    Order Specific Question:   Preferred imaging location?    Answer:   [facility]    Order Specific Question:   If indicated for the ordered procedure, I  authorize the administration of oral contrast media per Radiology protocol    Answer:   Yes      CHIEF COMPLAINT:  CC: f/u gastric cancer   Current Treatment:  Xeloda 4 tablets twice daily for 2 weeks then 1 week off.   INTERVAL HISTORY:  Sheryl [name] is here today for repeat clinical assessment. She denies fevers or chills. She states that she did have episode of severe upper abdominal pain on Monday 01/28/2023. Her abdomen became very hard and she was unable to get comfortable. Did not take anything for pain. Laid down and slept. When waking, abdomen was soft again. She continues to have abdominal tenderness in this area, though much less severe. Bowel movements are normal. Her appetite is good. Her weight has increased 4 pounds over last 3 weeks . Blood pressure is slightly elevated today. States that she is out of her medication and primary care will not refill until her next appointment which is November 2024.   I have reviewed the past medical history, past surgical history, social history and family history with the patient and they are unchanged from previous note.  ALLERGIES:  is allergic to aspirin, cyclobenzaprine, naproxen sodium, zithromax [azithromycin dihydrate], oxaliplatin, and dilaudid [hydromorphone].  MEDICATIONS:  Current Outpatient Medications  Medication Sig Dispense Refill   acetaminophen (TYLENOL) 500 MG tablet Take 2 tablets (1,000 mg total) by mouth every 8 (eight) hours as needed (pain). 30 tablet 1   Bacillus Coagulans-Inulin (PROBIOTIC-PREBIOTIC) 1-250 BILLION-MG CAPS Take 2 capsules by mouth daily. 30 capsule 1   Biotin 1000 MCG CHEW Chew 1,000 mcg by mouth daily. 30 tablet 2   Blood Glucose Monitoring Suppl (TRUE METRIX METER) w/Device KIT 1 kit by Does not apply route 3 (three) times daily as needed. 1 kit 0   capecitabine (XELODA) 500 MG tablet Take 4 tablets (2,000 mg total) by mouth 2 (two) times daily after a meal. Take within 30 minutes after meals. Take for 14  days on, then off for 7 days. Repeat every 21 days. 112 tablet 0   Cholecalciferol (VITAMIN D3) 125 MCG (5000 UT) TABS Take 1 tablet (5,000 Units total) by mouth daily. 30 tablet 2   diphenhydrAMINE-prednisoLONE-nystatin in lidocaine solution Take 5 mls by mouth 3 (three) times daily as needed for mouth pain. 140 mL 0   famotidine (PEPCID) 20 MG tablet Take 1 tablet (20 mg total) by mouth 2 (two) times daily. 60 tablet 2   HYDROcodone-acetaminophen (NORCO/VICODIN) 5-325 MG tablet Take 1 tablet by mouth every 6 (six) hours as needed for moderate pain. 10 tablet 0   lidocaine-prilocaine (EMLA) cream Apply 1 Application topically as needed. 30 g 1   ondansetron (ZOFRAN-ODT) 4 MG disintegrating tablet Take 2 tablets (8 mg total) by mouth every 8 (eight) hours as needed for nausea or vomiting. Dissolve one tab on tongue 30 minutes before each dose of bowel prep 20 tablet 2  potassium chloride SA (KLOR-CON M) 20 MEQ tablet Take 1 tablet (20 mEq total) by mouth 2 (two) times daily. Change to once daily after 5 days, until finish 30 tablet 0   prochlorperazine (COMPAZINE) 10 MG tablet Take 1 tablet (10 mg total) by mouth every 6 (six) hours as needed for nausea or vomiting. 30 tablet 1   sucralfate (CARAFATE) 1 g tablet Take 1 tablet (1 g total) by mouth 2 (two) times daily. 60 tablet 0   amLODipine (NORVASC) 10 MG tablet Take 1 tablet (10 mg total) by mouth daily. 90 tablet 0   pantoprazole (PROTONIX) 40 MG tablet Take 1 tablet (40 mg total) by mouth 2 (two) times daily. (Patient not taking: Reported on 11/26/2022) 180 tablet 0   rosuvastatin (CRESTOR) 5 MG tablet Take 1 tablet (5 mg total) by mouth daily. (Patient not taking: Reported on 04/18/2022) 90 tablet 1   Current Facility-Administered Medications  Medication Dose Route Frequency Provider Last Rate Last Admin   0.9 %  sodium chloride infusion  500 mL Intravenous Continuous Tressia Danas, MD        HISTORY OF PRESENT ILLNESS:   Oncology  History Overview Note   Cancer Staging  Gastric cancer [facility]) Staging form: Stomach, AJCC 8th Edition - Clinical stage from 04/19/2022: Stage IVB (cT2, cN0, pM1) - Signed by [name], MD on 05/08/2022 Total positive nodes: 0     Gastric cancer (HCC)  03/30/2022 Procedure   EGD:  Impression:  - Normal esophagus. - A few gastric polyps. Biopsied. - Gastritis. Biopsied. - Non-bleeding gastric ulcer with no stigmata of bleeding. Biopsied. - Normal examined duodenum. Biopsied.  Findings: Diffuse moderate inflammation characterized by congestion (edema), friability and granularity was found in the cardia, in the gastric fundus and in the gastric body. There were associated erosions in multiple places. Biopsies were taken from the antrum, body, and fundus with a cold forceps for histology. Estimated blood loss was minimal.  One non-bleeding cratered gastric ulcer with no stigmata of bleeding was found on the greater curvature of the stomach. The lesion was 6 mm in largest dimension. The mucosa around the ulcer was heaped and led to some deformity in the antrum. Biopsies were taken with a cold forceps for histology. Estimated blood loss was minimal.    03/30/2022 Pathology Results   Patient: Sheryl, [name]  Accession: [id number]  Diagnosis 1. Surgical [P], duodenal - BENIGN SMALL BOWEL MUCOSA WITH NO SIGNIFICANT PATHOLOGIC CHANGES 2. Surgical [P], gastric antrum - GASTRIC ANTRAL MUCOSA WITH FEATURES OF REACTIVE GASTROPATHY - NEGATIVE FOR H. PYLORI ON H&E STAIN - NEGATIVE FOR INTESTINAL METAPLASIA OR MALIGNANCY 3. Surgical [P], gastric body - GASTRIC OXYNTIC MUCOSA WITH REACTIVE/REPARATIVE CHANGES - NEGATIVE FOR H. PYLORI ON H&E STAIN - NEGATIVE FOR INTESTINAL METAPLASIA, DYSPLASIA OR MALIGNANCY 4. Surgical [P], greater curve ulceration - ADENOCARCINOMA WITH SIGNET RING CELL FEATURES (SEE NOTE) 5. Surgical [P], gastric polyps - ADENOCARCINOMA WITH SIGNET RING CELL FEATURES (SEE  NOTE) 6. Surgical [P], fundus (gastric) - ADENOCARCINOMA WITH SIGNET RING CELL FEATURES (SEE NOTE) 7. Surgical [P], colon, ascending, polyp (1) - TUBULAR ADENOMA. - NO HIGH GRADE DYSPLASIA OR MALIGNANCY. 8. Surgical [P], colon, transverse, polyp (1) - TUBULAR ADENOMA. - NO HIGH GRADE DYSPLASIA OR MALIGNANCY.    04/13/2022 Initial Diagnosis   Gastric cancer (HCC)   04/19/2022 Cancer Staging   Staging form: Stomach, AJCC 8th Edition - Clinical stage from 04/19/2022: Stage IVB (cT2, cN0, pM1) - Signed by [name], MD on 05/08/2022  Total positive nodes: 0   05/05/2022 Genetic Testing   Negative genetic testing on the Multi-cancer gene panel + RNA.  FH c.259C>T VUS identified.  The report date is May 05, 2022.  The Multi-Cancer + RNA Panel offered by [facility] includes sequencing and/or deletion/duplication analysis of the following 70 genes:  AIP*, ALK, APC*, ATM*, AXIN2*, BAP1*, BARD1*, BLM*, BMPR1A*, BRCA1*, BRCA2*, BRIP1*, CDC73*, CDH1*, CDK4, CDKN1B*, CDKN2A, CHEK2*, CTNNA1*, DICER1*, EPCAM (del/dup only), EGFR, FH*, FLCN*, GREM1 (promoter dup only), HOXB13, KIT, LZTR1, MAX*, MBD4, MEN1*, MET, MITF, MLH1*, MSH2*, MSH3*, MSH6*, MUTYH*, NF1*, NF2*, NTHL1*, PALB2*, PDGFRA, PMS2*, POLD1*, POLE*, POT1*, PRKAR1A*, PTCH1*, PTEN*, RAD51C*, RAD51D*, RB1*, RET, SDHA* (sequencing only), SDHAF2*, SDHB*, SDHC*, SDHD*, SMAD4*, SMARCA4*, SMARCB1*, SMARCE1*, STK11*, SUFU*, TMEM127*, TP53*, TSC1*, TSC2*, VHL*. RNA analysis is performed for * genes.    05/09/2022 - 06/07/2022 Chemotherapy   Patient is on Treatment Plan : GASTROESOPHAGEAL FLOT q14d X 4 cycles      Miscellaneous   Foundation One  Biomarker Findings Microsatellite status- Cannot be determined Tumor Mutational Burden- Cannot be determined  Genomic Findings  FGFR2 amplification,FGFR2-TACC2 fusion,  Rearrangement intron 17 ARAF amplification CCND3 amplification TP53 V214fs*74     05/30/2022 Imaging    IMPRESSION: 1. Mild  hypermetabolism corresponding to a dominant left upper quadrant mass and smaller perigastric nodules or nodes. 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 Recommend attention to the dominant left upper quadrant soft tissue mass on follow-up exams to exclude unlikely recurrent lymphoma. 2. No gastric hypermetabolism and no typical findings of metastatic disease.   06/20/2022 -  Chemotherapy   Patient is on Treatment Plan : GASTRIC FOLFOX q14d x 12 cycles     08/27/2022 Imaging    IMPRESSION: No focal gastric mass on CT.   No findings suspicious for recurrent or metastatic disease.   Stable left upper abdominal soft tissue lesion and small lymph nodes, chronic, favoring treated lymphoma.   11/27/2022 Imaging    IMPRESSION: 1. Questionable thickening of the distal esophagus/GE junction and gastric antrum, consider further evaluation with endoscopy. 2. Chronically stable left upper quadrant nodularity and prominent lymph nodes again favored treated lymphoma. Continued attention on follow-up imaging suggested. 3. No convincing evidence of metastatic disease in the chest, abdomen or pelvis. 4. Questionable asymmetric wall thickening of the rectum, consider further evaluation with colonoscopy. 5. Mild wall thickening of a nondistended urinary bladder, correlate with urinalysis to exclude cystitis. 6. Hepatic steatosis.       REVIEW OF SYSTEMS:   Constitutional: Denies fevers, chills or abnormal weight loss Eyes: Denies blurriness of vision Ears, nose, mouth, throat, and face: Denies mucositis or sore throat Respiratory: Denies cough, dyspnea or wheezes Cardiovascular: Denies palpitation, chest discomfort or lower extremity swelling Gastrointestinal:  Denies nausea or vomiting. Did have episode of significant abdominal pain on 01/28/2023. This improved with rest. Bowel movements are normal and appetite has been good.  Skin: Denies abnormal skin  rashes Lymphatics: Denies new lymphadenopathy or easy bruising Neurological:Denies numbness, tingling or new weaknesses Behavioral/Psych: Mood is stable, no new changes  All other systems were reviewed with the patient and are negative.   VITALS:   Today's Vitals   01/30/23 0900 01/30/23 0944  BP:  (!) 137/93  Pulse:  73  Resp:  17  Temp:  98.6 F (37 C)  TempSrc:  Oral  SpO2:  100%  Weight:  197 lb 14.4 oz (89.8 kg)  Height:  5\' 8"  (1.727 m)  PainSc: 0-No  pain    Body mass index is 30.09 kg/m.   Wt Readings from Last 3 Encounters:  01/30/23 197 lb 14.4 oz (89.8 kg)  01/09/23 193 lb 6.4 oz (87.7 kg)  12/19/22 191 lb 1.6 oz (86.7 kg)    Body mass index is 30.09 kg/m.  Performance status (ECOG): 1 - Symptomatic but completely ambulatory  PHYSICAL EXAM:   GENERAL:alert, no distress and comfortable SKIN: skin color, texture, turgor are normal, no rashes or significant lesions EYES: normal, Conjunctiva are pink and non-injected, sclera clear OROPHARYNX:no exudate, no erythema and lips, buccal mucosa, and tongue normal  NECK: supple, thyroid normal size, non-tender, without nodularity LYMPH:  no palpable lymphadenopathy in the cervical, axillary or inguinal LUNGS: clear to auscultation and percussion with normal breathing effort HEART: regular rate & rhythm and no murmurs and no lower extremity edema ABDOMEN:abdomen soft and non-tender. She does have mild tenderness with palpation of the upper abdomen.  Musculoskeletal:no cyanosis of digits and no clubbing  NEURO: alert & oriented x 3 with fluent speech, no focal motor/sensory deficits  LABORATORY DATA:  I have reviewed the data as listed    Component Value Date/Time   NA 139 01/30/2023 0928   NA 137 12/20/2020 0957   K 3.5 01/30/2023 0928   CL 103 01/30/2023 0928   CO2 30 01/30/2023 0928   GLUCOSE 118 (H) 01/30/2023 0928   BUN 13 01/30/2023 0928   BUN 11 12/20/2020 0957   CREATININE 0.67 01/30/2023 0928    CALCIUM 9.7 01/30/2023 0928   PROT 7.0 01/30/2023 0928   PROT 7.3 12/20/2020 0957   ALBUMIN 4.2 01/30/2023 0928   ALBUMIN 4.7 12/20/2020 0957   AST 31 01/30/2023 0928   ALT 26 01/30/2023 0928   ALKPHOS 113 01/30/2023 0928   BILITOT 1.0 01/30/2023 0928   GFRNONAA >60 01/30/2023 0928   GFRAA 106 03/31/2020 1036     Lab Results  Component Value Date   WBC 3.0 (L) 01/30/2023   NEUTROABS 1.9 01/30/2023   HGB 13.6 01/30/2023   HCT 36.7 01/30/2023   MCV 92.4 01/30/2023   PLT 242 01/30/2023    Addendum I have seen the patient, examined her. I agree with the assessment and and plan and have edited the notes.   Sheryl [name] is overall stable, she had an episode of severe epigastric pain 2 days ago, which was similar to the pain she had when she was diagnosed with gastric cancer.  It has resolved spontaneously.  No fever or chills.  Lab reviewed, adequate for treatment, will continue capecitabine.  Plan to repeat CT scan in next 2 to 3 weeks to evaluate her disease status.  All questions were answered.  I spent a total of 25 minutes for her visit, more than 50% time on face-to-face counseling.  [name] MD 01/30/2023

## 2023-01-30 NOTE — Assessment & Plan Note (Signed)
Refilled amlodipine until patient has appointment with primary care in November 2024.

## 2023-01-30 NOTE — Assessment & Plan Note (Addendum)
[id number] with peritoneal metastasis. 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   -she started first line chemo FLOT on 1/10. Chemotherapy is palliative.  -PD-L1 0-1%, no significant benefit from PD-L1 immunotherapy, FO revealed FGFR2 amplification and fusion (+), -FGFR inhibitors can be considered in future, no other targeted therapy available  -She tolerated chemo very well. -PET scan from 05/30/2022 was negative for primary tumor or metastatic disease, the known peritoneal mets did not show on PET.  -Due to fatigue, chemotherapy was changed her chemo from FLOT to FOLFOX on 06/20/2022, she tolerated well -she previously asked the role of surgery, depends on her next restaging CT scan findings, with consider referral to [facility] or [facility] to discuss HIPEC surgery  -she did have infusion reaction to oxaliplatin on C5, additional premeds were added infusion slowed to over 4 hours for cycle 6 and she tolerated well   -She is tolerated FOLFOX well overall, with moderate fatigue for a few days after infusion but able to recover well.  No signs of neuropathy. -Restaging CT abdomen pelvis from August 27, 2022 showed no residual disease.  -repeated staging CT from 11/27/2022 showed stable disease  -Her treatment was changed to maintenance Xeloda in early August 2024, which she has tolerated well  -she did have episode of severe epigastric pain 01/28/2023, similar to pain she had prior to cancer diagnosis.  -new CT CAP to be scheduled in next 2 weeks  -labs and follow up in 3 weeks

## 2023-02-12 ENCOUNTER — Other Ambulatory Visit (HOSPITAL_COMMUNITY): Payer: Self-pay | Admitting: Pharmacy Technician

## 2023-02-12 ENCOUNTER — Other Ambulatory Visit: Payer: Self-pay

## 2023-02-12 ENCOUNTER — Other Ambulatory Visit (HOSPITAL_COMMUNITY): Payer: Self-pay

## 2023-02-12 ENCOUNTER — Other Ambulatory Visit: Payer: Self-pay | Admitting: Hematology

## 2023-02-12 DIAGNOSIS — C162 Malignant neoplasm of body of stomach: Secondary | ICD-10-CM

## 2023-02-12 MED ORDER — CAPECITABINE 500 MG PO TABS
1000.0000 mg/m2 | ORAL_TABLET | Freq: Two times a day (BID) | ORAL | 0 refills | Status: DC
  Filled 2023-02-12: qty 112, 21d supply, fill #0

## 2023-02-12 NOTE — Progress Notes (Signed)
Specialty Pharmacy Refill Coordination Note  Sheryl [name] is a 57 y.o. female contacted today regarding refills of specialty medication(s) Capecitabine   Patient requested Delivery   Delivery date: 02/20/23   Verified address: [address] [place]    Medication will be filled on 02/19/23.  Refill Request sent to MD; Call if any delays.  New cycle start Monday 10/28

## 2023-02-13 ENCOUNTER — Encounter (HOSPITAL_COMMUNITY): Payer: Self-pay

## 2023-02-13 ENCOUNTER — Other Ambulatory Visit: Payer: Self-pay

## 2023-02-13 ENCOUNTER — Ambulatory Visit (HOSPITAL_COMMUNITY): Payer: Medicaid Other

## 2023-02-13 ENCOUNTER — Telehealth: Payer: Self-pay

## 2023-02-13 NOTE — Telephone Encounter (Signed)
Pt called stating that she received a call this morning from Radiology stating they are canceling the pt's CT Scan which scheduled for today at 3pm d/t Dr. Mosetta Putt did not release an order for them to proceed.  Explained to pt that the information she was told was incorrect.  Explained to pt why the CT Scan is being rescheduled for today.  Stated that the pt's insurance has not approved for the the CT to be preformed.  Stated once the pt's insurance approve the CT Scan, Central Scheduling will contact pt to get the pt scheduled.  Stated if the pt's insurance does not approve the CT Scan Dr. Mosetta Putt will order a test that the pt's insurance will approve.  Pt verbalized understanding and was "Thankful" for the explanation.    Followed up with Merla Riches on the Revenue Cycle Team regarding the "Pending" status of pt's CT Scan.  Delice Bison responded via [employer] that stating "This request is being reviewed by our clinicians per her insurance".

## 2023-02-19 ENCOUNTER — Ambulatory Visit (HOSPITAL_COMMUNITY)
Admission: RE | Admit: 2023-02-19 | Discharge: 2023-02-19 | Disposition: A | Discharge status: Home / Self Care | Payer: Medicaid Other | Source: Ambulatory Visit | Attending: Nurse Practitioner | Admitting: Nurse Practitioner

## 2023-02-19 DIAGNOSIS — C162 Malignant neoplasm of body of stomach: Secondary | ICD-10-CM | POA: Insufficient documentation

## 2023-02-19 MED ORDER — HEPARIN SOD (PORK) LOCK FLUSH 100 UNIT/ML IV SOLN
INTRAVENOUS | Status: AC
Start: 2023-02-19 — End: ?
  Filled 2023-02-19: qty 5

## 2023-02-19 MED ORDER — IOHEXOL 300 MG/ML  SOLN
100.0000 mL | Freq: Once | INTRAMUSCULAR | Status: AC | PRN
Start: 2023-02-19 — End: 2023-02-19
  Administered 2023-02-19: 100 mL via INTRAVENOUS

## 2023-02-19 MED ORDER — SODIUM CHLORIDE (PF) 0.9 % IJ SOLN
INTRAMUSCULAR | Status: AC
  Filled 2023-02-19: qty 50

## 2023-02-19 MED ORDER — HEPARIN SOD (PORK) LOCK FLUSH 10 UNIT/ML IV SOLN
10.0000 [IU] | Freq: Once | INTRAVENOUS | Status: DC
  Filled 2023-02-19: qty 1

## 2023-02-19 NOTE — Assessment & Plan Note (Signed)
[id number] with peritoneal metastasis. 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   -she started first line chemo FLOT on 1/10 -She understands that chemotherapy is palliative, to prolong her life.  We are unlikely going to cure her cancer. -PD-L1 0-1%, no significant benefit from PD-L1 immunotherapy, FO revealed FGFR2 amplification and fusion (+), FGFR inhibitors can be considered in future, no other targeted therapy available  -She has been tolerating chemo very well, will continue for now  -PET scan from 05/30/2022 was negative for primary tumor or metastatic disease, the known peritoneal mets did not show on PET.  -she has been tolerating chemo well overall. Due to fatigue, I have changed her chemo from FLOT to FOLFOX on 06/20/2022, she tolerated well -she previously asked the role of surgery, depends on her next restaging CT scan findings, I may refer her to [facility] or [facility] to discuss HIPEC surgery  -due to her infusion reaction to oxaliplatin on C5, we added additional premeds and gave slow infusion over 4 hours for cycle 6 and she tolerated well  -She is not able to return to work due to the cancer and treatment related symptoms.  -She is tolerating FOLFOX well overall, with moderate fatigue for a few days after infusion but able to recover well.  No signs of neuropathy at this point. -Restaging CT abdomen pelvis from August 27, 2022 showed no residual disease.  -We again discussed maintenance therapy with Xeloda down the road, we will stop oxaliplatin when she develops side effects especially neuropathy, or after next scan -repeated staging CT from 11/26/2021 showed stable disease  -I have changed her treatment to maintenance Xeloda in early August 2024, she is tolerating well overall

## 2023-02-20 ENCOUNTER — Encounter: Payer: Self-pay | Admitting: Hematology

## 2023-02-20 ENCOUNTER — Other Ambulatory Visit: Payer: Self-pay

## 2023-02-20 ENCOUNTER — Inpatient Hospital Stay (HOSPITAL_BASED_OUTPATIENT_CLINIC_OR_DEPARTMENT_OTHER): Payer: Medicaid Other

## 2023-02-20 ENCOUNTER — Inpatient Hospital Stay: Payer: Medicaid Other | Admitting: Hematology

## 2023-02-20 VITALS — BP 123/87 | HR 69 | Temp 98.4°F | Resp 18 | Ht 68.0 in | Wt 199.7 lb

## 2023-02-20 DIAGNOSIS — C162 Malignant neoplasm of body of stomach: Secondary | ICD-10-CM

## 2023-02-20 DIAGNOSIS — C169 Malignant neoplasm of stomach, unspecified: Secondary | ICD-10-CM | POA: Diagnosis not present

## 2023-02-20 DIAGNOSIS — Z95828 Presence of other vascular implants and grafts: Secondary | ICD-10-CM

## 2023-02-20 LAB — CMP (CANCER CENTER ONLY)
ALT: 27 U/L (ref 0–44)
AST: 31 U/L (ref 15–41)
Albumin: 4.4 g/dL (ref 3.5–5.0)
Alkaline Phosphatase: 121 U/L (ref 38–126)
Anion gap: 4 — ABNORMAL LOW (ref 5–15)
BUN: 10 mg/dL (ref 6–20)
CO2: 31 mmol/L (ref 22–32)
Calcium: 9.6 mg/dL (ref 8.9–10.3)
Chloride: 104 mmol/L (ref 98–111)
Creatinine: 0.59 mg/dL (ref 0.44–1.00)
GFR, Estimated: >60 mL/min (ref >60)
Glucose, Bld: 105 mg/dL — ABNORMAL HIGH (ref 70–99)
Potassium: 3.6 mmol/L (ref 3.5–5.1)
Sodium: 139 mmol/L (ref 135–145)
Total Bilirubin: 1 mg/dL (ref 0.3–1.2)
Total Protein: 7.1 g/dL (ref 6.5–8.1)

## 2023-02-20 LAB — CBC WITH DIFFERENTIAL (CANCER CENTER ONLY)
Abs Immature Granulocytes: 0.02 10*3/uL (ref 0.00–0.07)
Basophils Absolute: 0 10*3/uL (ref 0.0–0.1)
Basophils Relative: 1 %
Eosinophils Absolute: 0.1 10*3/uL (ref 0.0–0.5)
Eosinophils Relative: 3 %
HCT: 35.7 % — ABNORMAL LOW (ref 36.0–46.0)
Hemoglobin: 12.9 g/dL (ref 12.0–15.0)
Immature Granulocytes: 1 %
Lymphocytes Relative: 26 %
Lymphs Abs: 0.8 10*3/uL (ref 0.7–4.0)
MCH: 34 pg (ref 26.0–34.0)
MCHC: 36.1 g/dL — ABNORMAL HIGH (ref 30.0–36.0)
MCV: 94.2 fL (ref 80.0–100.0)
Monocytes Absolute: 0.5 10*3/uL (ref 0.1–1.0)
Monocytes Relative: 15 %
Neutro Abs: 1.6 10*3/uL — ABNORMAL LOW (ref 1.7–7.7)
Neutrophils Relative %: 54 %
Platelet Count: 248 10*3/uL (ref 150–400)
RBC: 3.79 MIL/uL — ABNORMAL LOW (ref 3.87–5.11)
RDW: 18.1 % — ABNORMAL HIGH (ref 11.5–15.5)
WBC Count: 2.9 10*3/uL — ABNORMAL LOW (ref 4.0–10.5)
nRBC: 0 % (ref 0.0–0.2)

## 2023-02-20 MED ORDER — HEPARIN SOD (PORK) LOCK FLUSH 100 UNIT/ML IV SOLN
500.0000 [IU] | Freq: Once | INTRAVENOUS | Status: AC
Start: 2023-02-20 — End: 2023-02-20
  Administered 2023-02-20: 500 [IU]

## 2023-02-20 MED ORDER — SODIUM CHLORIDE 0.9% FLUSH
10.0000 mL | Freq: Once | INTRAVENOUS | Status: AC
Start: 2023-02-20 — End: 2023-02-20
  Administered 2023-02-20: 10 mL

## 2023-02-20 NOTE — Progress Notes (Signed)
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   -she started first line chemo FLOT on 1/10 -She understands that chemotherapy is palliative, to prolong her life.  We are unlikely going to cure her cancer. -PD-L1 0-1%, no significant benefit from PD-L1 immunotherapy, FO revealed FGFR2 amplification and fusion (+), FGFR inhibitors can be considered in future, no other targeted therapy available  -She has been tolerating chemo very well, will continue for now  -PET scan from 05/30/2022 was negative for primary tumor or metastatic disease, the known peritoneal mets did not show on PET.  -she has been tolerating chemo well overall. Due to fatigue, I have changed her chemo from FLOT to FOLFOX on 06/20/2022, she tolerated well -she previously asked the role of surgery, depends on her next restaging CT scan findings, [facility] to discuss HIPEC surgery  -due to her infusion reaction to oxaliplatin on C5, we added additional premeds and gave slow infusion over 4 hours for cycle 6 and she tolerated well  -She is not able to return to work due to the cancer and treatment related symptoms.  -She is tolerating FOLFOX well overall, with moderate fatigue for a few days after infusion but able to recover well.  No signs of neuropathy at this point. -Restaging CT abdomen pelvis from August 27, 2022 showed no  residual disease.  -We again discussed maintenance therapy with Xeloda down the road, we will stop oxaliplatin when she develops side effects especially neuropathy, or after next scan -repeated staging CT from 11/26/2021 showed stable disease  -I have changed her treatment to maintenance Xeloda in early August 2024, she is tolerating well overall    Assessment and Plan    Metastatic Gastric Cancer -I personally reviewed her restaging CT scan images and discussed the findings with patient and her family.  Stable disease on current chemotherapy regimen Xeloda. Noted skin changes and pain in hands and feet likely secondary to chemotherapy. Discussed the balance between disease control and quality of life. -Postpone chemotherapy for one week to allow for skin healing. -Reduce Xeloda dose to 3 in the morning and 4 in the evening for the next cycle. -Order Caris genetic testing on previous biopsy sample to assess for potential targeted therapy options in the future. -Continue to monitor disease progression with regular CT scans. -Patient and her children had many questions about the role of endoscopy to evaluate disease status, I did not recommend repeating endoscopy at this point,  Hand-and-foot syndrome Skin peeling noted on hands, feet, and left big toe. Likely secondary to chemotherapy. No signs of infection. -Cleanse with soap and water. -Apply topical Neosporin to promote healing.  Plan -Will postpone her next cycle of Xeloda for week due to her skin toxicity Follow-up in 4 weeks to assess response to reduced chemotherapy dosage and skin healing. If any new problems arise from chemotherapy, patient is to call the office before the next appointment.    -  will order Caris on her initial biopsy     SUMMARY OF ONCOLOGIC HISTORY: Oncology History Overview Note   Cancer Staging  Gastric cancer [facility]) Staging form: Stomach, AJCC 8th Edition - Clinical stage from 04/19/2022: Stage IVB (cT2,  cN0, pM1) - Signed by [name], MD on 05/08/2022 Total positive nodes: 0     Gastric cancer (HCC)  03/30/2022 Procedure   EGD:  Impression:  - Normal esophagus. - A few gastric polyps. Biopsied. - Gastritis. Biopsied. - Non-bleeding gastric ulcer with no stigmata of bleeding. Biopsied. - Normal examined duodenum. Biopsied.  Findings: Diffuse moderate inflammation characterized by congestion (edema), friability and granularity was found in the cardia, in the gastric fundus and in the gastric body. There were associated erosions in multiple places. Biopsies were taken from the antrum, body, and fundus with a cold forceps for histology. Estimated blood loss was minimal.  One non-bleeding cratered gastric ulcer with no stigmata of bleeding was found on the greater curvature of the stomach. The lesion was 6 mm in largest dimension. The mucosa around the ulcer was heaped and led to some deformity in the antrum. Biopsies were taken with a cold forceps for histology. Estimated blood loss was minimal.    03/30/2022 Pathology Results   Patient: KAFI, KEZER  Accession: [id number]  Diagnosis 1. Surgical [P], duodenal - BENIGN SMALL BOWEL MUCOSA WITH NO SIGNIFICANT PATHOLOGIC CHANGES 2. Surgical [P], gastric antrum - GASTRIC ANTRAL MUCOSA WITH FEATURES OF REACTIVE GASTROPATHY - NEGATIVE FOR H. PYLORI ON H&E STAIN - NEGATIVE FOR INTESTINAL METAPLASIA OR MALIGNANCY 3. Surgical [P], gastric body - GASTRIC OXYNTIC MUCOSA WITH REACTIVE/REPARATIVE CHANGES - NEGATIVE FOR H. PYLORI ON H&E STAIN - NEGATIVE FOR INTESTINAL METAPLASIA, DYSPLASIA OR MALIGNANCY 4. Surgical [P], greater curve ulceration - ADENOCARCINOMA WITH SIGNET RING CELL FEATURES (SEE NOTE) 5. Surgical [P], gastric polyps - ADENOCARCINOMA WITH SIGNET RING CELL FEATURES (SEE NOTE) 6. Surgical [P], fundus (gastric) - ADENOCARCINOMA WITH SIGNET RING CELL FEATURES (SEE NOTE) 7. Surgical [P], colon, ascending, polyp (1) -  TUBULAR ADENOMA. - NO HIGH GRADE DYSPLASIA OR MALIGNANCY. 8. Surgical [P], colon, transverse, polyp (1) - TUBULAR ADENOMA. - NO HIGH GRADE DYSPLASIA OR MALIGNANCY.    04/13/2022 Initial Diagnosis   Gastric cancer (HCC)   04/19/2022 Cancer Staging   Staging form: Stomach, AJCC 8th Edition - Clinical stage from 04/19/2022: Stage IVB (cT2, cN0, pM1) - Signed by [name], MD on 05/08/2022 Total positive nodes: 0   05/05/2022 Genetic Testing   Negative genetic testing on the Multi-cancer gene panel + RNA.  FH c.259C>T VUS identified.  The report date is May 05, 2022.  The Multi-Cancer + RNA Panel offered by [facility] includes sequencing and/or deletion/duplication analysis of the following 70 genes:  AIP*, ALK, APC*, ATM*, AXIN2*, BAP1*, BARD1*, BLM*, BMPR1A*, BRCA1*, BRCA2*, BRIP1*, CDC73*, CDH1*, CDK4, CDKN1B*, CDKN2A, CHEK2*, CTNNA1*, DICER1*, EPCAM (del/dup only), EGFR, FH*, FLCN*, GREM1 (promoter dup only), HOXB13, KIT, LZTR1, MAX*, MBD4, MEN1*, MET, MITF, MLH1*, MSH2*, MSH3*, MSH6*, MUTYH*, NF1*, NF2*, NTHL1*, PALB2*, PDGFRA, PMS2*, POLD1*, POLE*, POT1*, PRKAR1A*, PTCH1*, PTEN*, RAD51C*, RAD51D*, RB1*, RET, SDHA* (sequencing only), SDHAF2*, SDHB*, SDHC*, SDHD*, SMAD4*, SMARCA4*, SMARCB1*, SMARCE1*, STK11*, SUFU*, TMEM127*, TP53*, TSC1*, TSC2*, VHL*. RNA analysis is performed for * genes.    05/09/2022 - 06/07/2022 Chemotherapy   Patient is on Treatment Plan : GASTROESOPHAGEAL FLOT q14d X 4 cycles      Miscellaneous   Foundation One  Biomarker Findings Microsatellite status- Cannot be determined Tumor Mutational Burden- Cannot be determined  Genomic Findings  FGFR2 amplification,FGFR2-TACC2 fusion,  Rearrangement intron 17 ARAF amplification CCND3 amplification TP53 V250fs*74     05/30/2022 Imaging    IMPRESSION: 1. Mild hypermetabolism corresponding to a dominant left upper quadrant mass and smaller perigastric nodules or nodes. Given size stability back to 2012, favored to be  related to treated lymphoma. Recommend attention to the dominant left upper quadrant soft tissue mass on follow-up exams to exclude unlikely recurrent lymphoma. 2. No gastric hypermetabolism and no typical findings of metastatic disease.   06/20/2022 -  Chemotherapy   Patient is on Treatment Plan : GASTRIC FOLFOX q14d x 12 cycles     08/27/2022 Imaging    IMPRESSION: No focal gastric mass on CT.   No findings suspicious for recurrent or metastatic disease.   Stable left upper abdominal soft tissue lesion and small lymph nodes, chronic, favoring treated lymphoma.   11/27/2022 Imaging    IMPRESSION: 1. Questionable thickening of the distal esophagus/GE junction and gastric antrum, consider further evaluation with endoscopy. 2. Chronically stable left upper quadrant nodularity and prominent lymph nodes again favored treated lymphoma. Continued attention on follow-up imaging suggested. 3. No convincing evidence of metastatic disease in the chest, abdomen or pelvis. 4. Questionable asymmetric wall thickening of the rectum, consider further evaluation with colonoscopy. 5. Mild wall thickening of a nondistended urinary bladder, correlate with urinalysis to exclude cystitis. 6. Hepatic steatosis.      Discussed the use of AI scribe software for clinical note transcription with the patient, who gave verbal consent to proceed.  History of Present Illness   A 57 year old patient with a history of metastatic gastric cancer presents with concerns about side effects from her chemotherapy treatment. She reports that her hands and feet have darkened significantly, which she attributes to her chemotherapy pills. She also experiences pain in her feet and toes, particularly her left big toe, which she describes as feeling like "somebody's sticking something in it." The pain is severe enough to affect her mobility, particularly after periods of rest. She also reports a sensation of something  sticking out of her right heel. Despite these side effects, she has been adherent to her chemotherapy regimen, although she reports a shortage of two pills in her last prescription.  The patient also expresses concerns about her disease progression and the effectiveness of her current treatment. She is aware that her cancer is stage four and not curable, but she hopes for a prolonged life with controlled disease. She expresses a desire to know more about her disease status and whether the cancer has spread, particularly to her esophagus.         All other systems were reviewed with the patient and are negative.  MEDICAL HISTORY:  Past Medical History:  Diagnosis Date   Blood transfusion without reported diagnosis    had transfusion with hysterectomy   Cataract    Colon polyps 2012   Diabetes (HCC) 03/13/2021   Diabetes (HCC) 05/21/2019   Family history of breast cancer    Family history of pancreatic cancer    Family history of stomach cancer    Fibroid    gastric ca 03/2022   GERD (gastroesophageal reflux disease)    H/O blood clots    History of hysterectomy    fibroids and heavy cycles   Hypertension     SURGICAL HISTORY: Past Surgical History:  Procedure Laterality Date   ABDOMINAL HYSTERECTOMY     BIOPSY  04/19/2022   Procedure: BIOPSY;  Surgeon: Corliss Parish  Montez Hageman., MD;  Location: [facility];  Service: Gastroenterology;;   COLONOSCOPY     ESOPHAGOGASTRODUODENOSCOPY (EGD) WITH PROPOFOL N/A 04/19/2022   Procedure: ESOPHAGOGASTRODUODENOSCOPY (EGD) WITH PROPOFOL;  Surgeon: Meridee Score Netty Starring., MD;  Location: [facility];  Service: Gastroenterology;  Laterality: N/A;   EUS N/A 04/19/2022   Procedure: UPPER ENDOSCOPIC ULTRASOUND (EUS) RADIAL;  Surgeon: Lemar Lofty., MD;  Location: [facility];  Service: Gastroenterology;  Laterality: N/A;   EXCISION OF SKIN TAG  05/03/2022   Procedure: EXCISION OF CHEST WALL SKIN LESION;  Surgeon: Fritzi Mandes, MD;   Location: [facility] OR;  Service: General;;   LAPAROSCOPY N/A 05/03/2022   Procedure: LAPAROSCOPY DIAGNOSTIC WITH PERITONEAL WASHINGS;  Surgeon: Fritzi Mandes, MD;  Location: [facility];  Service: General;  Laterality: N/A;   POLYPECTOMY  04/19/2022   Procedure: POLYPECTOMY;  Surgeon: Lemar Lofty., MD;  Location: [facility];  Service: Gastroenterology;;   PORTACATH PLACEMENT N/A 05/03/2022   Procedure: INSERTION PORT-A-CATH WITH ULTRASOUND GUIDANCE;  Surgeon: Fritzi Mandes, MD;  Location: [facility];  Service: General;  Laterality: N/A;   UPPER GASTROINTESTINAL ENDOSCOPY      I have reviewed the social history and family history with the patient and they are unchanged from previous note.  ALLERGIES:  is allergic to aspirin, cyclobenzaprine, naproxen sodium, zithromax [azithromycin dihydrate], oxaliplatin, and dilaudid [hydromorphone].  MEDICATIONS:  Current Outpatient Medications  Medication Sig Dispense Refill   acetaminophen (TYLENOL) 500 MG tablet Take 2 tablets (1,000 mg total) by mouth every 8 (eight) hours as needed (pain). 30 tablet 1   amLODipine (NORVASC) 10 MG tablet Take 1 tablet (10 mg total) by mouth daily. 90 tablet 0   Bacillus Coagulans-Inulin (PROBIOTIC-PREBIOTIC) 1-250 BILLION-MG CAPS Take 2 capsules by mouth daily. 30 capsule 1   Biotin 1000 MCG CHEW Chew 1,000 mcg by mouth daily. 30 tablet 2   Blood Glucose Monitoring Suppl (TRUE METRIX METER) w/Device KIT 1 kit by Does not apply route 3 (three) times daily as needed. 1 kit 0   capecitabine (XELODA) 500 MG tablet Take 4 tablets (2,000 mg total) by mouth 2 (two) times daily after a meal. Take within 30 minutes after meals. Take for 14 days on, then off for 7 days. Repeat every 21 days. 112 tablet 0   Cholecalciferol (VITAMIN D3) 125 MCG (5000 UT) TABS Take 1 tablet (5,000 Units total) by mouth daily. 30 tablet 2   diphenhydrAMINE-prednisoLONE-nystatin in lidocaine solution Take 5 mls by mouth 3 (three) times daily as needed for  mouth pain. 140 mL 0   famotidine (PEPCID) 20 MG tablet Take 1 tablet (20 mg total) by mouth 2 (two) times daily. 60 tablet 2   HYDROcodone-acetaminophen (NORCO/VICODIN) 5-325 MG tablet Take 1 tablet by mouth every 6 (six) hours as needed for moderate pain. 10 tablet 0   lidocaine-prilocaine (EMLA) cream Apply 1 Application topically as needed. 30 g 1   ondansetron (ZOFRAN-ODT) 4 MG disintegrating tablet Take 2 tablets (8 mg total) by mouth every 8 (eight) hours as needed for nausea or vomiting. Dissolve one tab on tongue 30 minutes before each dose of bowel prep 20 tablet 2   potassium chloride SA (KLOR-CON M) 20 MEQ tablet Take 1 tablet (20 mEq total) by mouth 2 (two) times daily. Change to once daily after 5 days, until finish 30 tablet 0   prochlorperazine (COMPAZINE) 10 MG tablet Take 1 tablet (10 mg total) by mouth every 6 (six) hours as needed for nausea or vomiting. 30 tablet  1   rosuvastatin (CRESTOR) 5 MG tablet Take 1 tablet (5 mg total) by mouth daily. (Patient not taking: Reported on 04/18/2022) 90 tablet 1   sucralfate (CARAFATE) 1 g tablet Take 1 tablet (1 g total) by mouth 2 (two) times daily. 60 tablet 0   Current Facility-Administered Medications  Medication Dose Route Frequency Provider Last Rate Last Admin   0.9 %  sodium chloride infusion  500 mL Intravenous Continuous Tressia Danas, MD        PHYSICAL EXAMINATION: ECOG PERFORMANCE STATUS: 1 - Symptomatic but completely ambulatory  Vitals:   02/20/23 0942  BP: 123/87  Pulse: 69  Resp: 18  Temp: 98.4 F (36.9 C)  SpO2: 100%   Wt Readings from Last 3 Encounters:  02/20/23 199 lb 11.2 oz (90.6 kg)  01/30/23 197 lb 14.4 oz (89.8 kg)  01/09/23 193 lb 6.4 oz (87.7 kg)     GENERAL:alert, no distress and comfortable SKIN: skin color, texture, turgor are normal, no rashes or significant lesions EYES: normal, Conjunctiva are pink and non-injected, sclera clear NECK: supple, thyroid normal size, non-tender, without  nodularity LYMPH:  no palpable lymphadenopathy in the cervical, axillary  LUNGS: clear to auscultation and percussion with normal breathing effort HEART: regular rate & rhythm and no murmurs and no lower extremity edema ABDOMEN:abdomen soft, non-tender and normal bowel sounds Musculoskeletal:no cyanosis of digits and no clubbing  NEURO: alert & oriented x 3 with fluent speech, no focal motor/sensory deficits  Physical Exam   SKIN: No skin breakdown observed on the left big toe. Minimal skin breakdown noted on the bottom of the feet.      LABORATORY DATA:  I have reviewed the data as listed    Latest Ref Rng & Units 02/20/2023    9:21 AM 01/30/2023    9:28 AM 01/09/2023    8:20 AM  CBC  WBC 4.0 - 10.5 K/uL 2.9  3.0  3.7   Hemoglobin 12.0 - 15.0 g/dL 16.1  09.6  04.5   Hematocrit 36.0 - 46.0 % 35.7  36.7  38.4   Platelets 150 - 400 K/uL 248  242  225         Latest Ref Rng & Units 02/20/2023    9:21 AM 01/30/2023    9:28 AM 01/09/2023    8:20 AM  CMP  Glucose 70 - 99 mg/dL 409  811  914   BUN 6 - 20 mg/dL 10  13  10    Creatinine 0.44 - 1.00 mg/dL 7.82  9.56  2.13   Sodium 135 - 145 mmol/L 139  139  140   Potassium 3.5 - 5.1 mmol/L 3.6  3.5  3.3   Chloride 98 - 111 mmol/L 104  103  104   CO2 22 - 32 mmol/L 31  30  30    Calcium 8.9 - 10.3 mg/dL 9.6  9.7  9.7   Total Protein 6.5 - 8.1 g/dL 7.1  7.0  7.2   Total Bilirubin 0.3 - 1.2 mg/dL 1.0  1.0  1.0   Alkaline Phos 38 - 126 U/L 121  113  110   AST 15 - 41 U/L 31  31  35   ALT 0 - 44 U/L 27  26  29        RADIOGRAPHIC STUDIES: I have personally reviewed the radiological images as listed and agreed with the findings in the report. CT CHEST ABDOMEN PELVIS W CONTRAST  Result Date: 02/20/2023 CLINICAL DATA:  Gastric carcinoma, assess  treatment response EXAM: CT CHEST, ABDOMEN, AND PELVIS WITH CONTRAST TECHNIQUE: Multidetector CT imaging of the chest, abdomen and pelvis was performed following the standard protocol during  bolus administration of intravenous contrast. RADIATION DOSE REDUCTION: This exam was performed according to the departmental dose-optimization program which includes automated exposure control, adjustment of the mA and/or kV according to patient size and/or use of iterative reconstruction technique. CONTRAST:  OMNIPAQUE IOHEXOL 300 MG/ML  SOLN COMPARISON:  11/27/2022 and previous FINDINGS: CT CHEST FINDINGS Cardiovascular: Right IJ port catheter to the distal SVC. Heart size normal. No pericardial effusion. Central great vessels grossly unremarkable. Mediastinum/Nodes: Mild circumferential wall thickening in the distal esophagus an GE junction region as before. Esophageal varices. No mediastinal hematoma, mass, or adenopathy. Lungs/Pleura: No pleural effusion. No pneumothorax. Lungs are clear. Musculoskeletal: No chest wall mass or suspicious bone lesions identified. CT ABDOMEN PELVIS FINDINGS Hepatobiliary: No focal liver abnormality is seen. No gallstones, gallbladder wall thickening, or biliary dilatation. Pancreas: Unremarkable. No pancreatic ductal dilatation or surrounding inflammatory changes. Spleen: Normal in size without focal abnormality. Adrenals/Urinary Tract: No adrenal mass. Septated 7.9 cm 9 HU probable left renal cyst from the lower pole, stable; no follow-up indicated. No urolithiasis or hydronephrosis. Urinary bladder incompletely distended. Stomach/Bowel: Circumferential soft of wall thickening at the GE junction. Stomach otherwise unremarkable, incompletely distended. Small bowel decompressed. Normal appendix. Colon partially distended, unremarkable. Vascular/Lymphatic: No AAA. No abdominal or pelvic adenopathy. Lobular mass inferior to the spleen with scattered coarse calcifications, 3.8 cm maximum transverse diameter, stable on scans back to 2012. Reproductive: Post hysterectomy. Coarse calcifications in the right adnexal region as before. No adnexal mass. Other: No ascites. Bilateral  pelvic phleboliths. No free air. Hazy changes centrally in the mesentery, stable. Musculoskeletal: Facet DJD L4-S1.  No acute findings. IMPRESSION: 1. No evidence of metastatic disease. 2. Circumferential wall thickening at the GE junction as before. 3. Stable 3.8 cm lobular mass inferior to the spleen, stable on scans back to 2012. Electronically Signed   By: Corlis Leak M.D.   On: 02/20/2023 08:08      No orders of the defined types were placed in this encounter.  All questions were answered. The patient knows to call the clinic with any problems, questions or concerns. No barriers to learning was detected. The total time spent in the appointment was 40 minutes.     [name], MD 02/20/2023

## 2023-02-21 ENCOUNTER — Other Ambulatory Visit: Payer: Self-pay

## 2023-02-22 ENCOUNTER — Other Ambulatory Visit: Payer: Self-pay

## 2023-02-22 DIAGNOSIS — C162 Malignant neoplasm of body of stomach: Secondary | ICD-10-CM

## 2023-02-22 NOTE — Progress Notes (Signed)
Caris LS test ordered on surgical Case# [id number].  Liquid Bx will be drawn on 02/26/2023.

## 2023-02-25 ENCOUNTER — Other Ambulatory Visit: Payer: Self-pay

## 2023-02-26 ENCOUNTER — Other Ambulatory Visit: Payer: Self-pay | Admitting: *Deleted

## 2023-02-26 ENCOUNTER — Other Ambulatory Visit: Payer: Self-pay | Admitting: Gastroenterology

## 2023-02-26 ENCOUNTER — Other Ambulatory Visit: Payer: Self-pay

## 2023-02-26 ENCOUNTER — Inpatient Hospital Stay: Payer: Medicaid Other

## 2023-02-26 ENCOUNTER — Other Ambulatory Visit: Payer: Self-pay | Admitting: Hematology

## 2023-02-26 DIAGNOSIS — C162 Malignant neoplasm of body of stomach: Secondary | ICD-10-CM

## 2023-02-26 DIAGNOSIS — C169 Malignant neoplasm of stomach, unspecified: Secondary | ICD-10-CM | POA: Diagnosis not present

## 2023-02-26 DIAGNOSIS — Z95828 Presence of other vascular implants and grafts: Secondary | ICD-10-CM

## 2023-02-26 LAB — MISCELLANEOUS TEST

## 2023-02-26 LAB — CBC WITH DIFFERENTIAL (CANCER CENTER ONLY)
Abs Immature Granulocytes: 0.01 10*3/uL (ref 0.00–0.07)
Basophils Absolute: 0 10*3/uL (ref 0.0–0.1)
Basophils Relative: 1 %
Eosinophils Absolute: 0.1 10*3/uL (ref 0.0–0.5)
Eosinophils Relative: 3 %
HCT: 37.4 % (ref 36.0–46.0)
Hemoglobin: 13.4 g/dL (ref 12.0–15.0)
Immature Granulocytes: 0 %
Lymphocytes Relative: 28 %
Lymphs Abs: 0.9 10*3/uL (ref 0.7–4.0)
MCH: 34.6 pg — ABNORMAL HIGH (ref 26.0–34.0)
MCHC: 35.8 g/dL (ref 30.0–36.0)
MCV: 96.6 fL (ref 80.0–100.0)
Monocytes Absolute: 0.5 10*3/uL (ref 0.1–1.0)
Monocytes Relative: 17 %
Neutro Abs: 1.6 10*3/uL — ABNORMAL LOW (ref 1.7–7.7)
Neutrophils Relative %: 51 %
Platelet Count: 233 10*3/uL (ref 150–400)
RBC: 3.87 MIL/uL (ref 3.87–5.11)
RDW: 18.4 % — ABNORMAL HIGH (ref 11.5–15.5)
WBC Count: 3 10*3/uL — ABNORMAL LOW (ref 4.0–10.5)
nRBC: 0 % (ref 0.0–0.2)

## 2023-02-26 LAB — CMP (CANCER CENTER ONLY)
ALT: 26 U/L (ref 0–44)
AST: 36 U/L (ref 15–41)
Albumin: 4.4 g/dL (ref 3.5–5.0)
Alkaline Phosphatase: 109 U/L (ref 38–126)
Anion gap: 6 (ref 5–15)
BUN: 10 mg/dL (ref 6–20)
CO2: 30 mmol/L (ref 22–32)
Calcium: 9.3 mg/dL (ref 8.9–10.3)
Chloride: 105 mmol/L (ref 98–111)
Creatinine: 0.58 mg/dL (ref 0.44–1.00)
GFR, Estimated: >60 mL/min (ref >60)
Glucose, Bld: 108 mg/dL — ABNORMAL HIGH (ref 70–99)
Potassium: 3.3 mmol/L — ABNORMAL LOW (ref 3.5–5.1)
Sodium: 141 mmol/L (ref 135–145)
Total Bilirubin: 1.5 mg/dL — ABNORMAL HIGH (ref 0.3–1.2)
Total Protein: 7.1 g/dL (ref 6.5–8.1)

## 2023-02-26 MED ORDER — SUCRALFATE 1 G PO TABS
1.0000 g | ORAL_TABLET | Freq: Two times a day (BID) | ORAL | 0 refills | Status: DC
  Filled 2023-02-26: qty 60, 30d supply, fill #0

## 2023-02-26 MED ORDER — HEPARIN SOD (PORK) LOCK FLUSH 100 UNIT/ML IV SOLN
500.0000 [IU] | Freq: Once | INTRAVENOUS | Status: AC
Start: 2023-02-26 — End: 2023-02-26
  Administered 2023-02-26: 500 [IU]

## 2023-02-26 MED ORDER — FAMOTIDINE 20 MG PO TABS
20.0000 mg | ORAL_TABLET | Freq: Two times a day (BID) | ORAL | 2 refills | Status: DC
  Filled 2023-02-26: qty 60, 30d supply, fill #0
  Filled 2023-04-01: qty 60, 30d supply, fill #1
  Filled 2023-05-02: qty 60, 30d supply, fill #2

## 2023-02-26 MED ORDER — SODIUM CHLORIDE 0.9% FLUSH
10.0000 mL | Freq: Once | INTRAVENOUS | Status: AC
Start: 2023-02-26 — End: 2023-02-26
  Administered 2023-02-26: 10 mL

## 2023-02-27 ENCOUNTER — Other Ambulatory Visit: Payer: Self-pay

## 2023-03-08 ENCOUNTER — Encounter: Payer: Self-pay | Admitting: Hematology

## 2023-03-11 ENCOUNTER — Other Ambulatory Visit (HOSPITAL_COMMUNITY): Payer: Self-pay

## 2023-03-11 ENCOUNTER — Other Ambulatory Visit: Payer: Self-pay | Admitting: Hematology

## 2023-03-11 ENCOUNTER — Other Ambulatory Visit: Payer: Self-pay

## 2023-03-11 DIAGNOSIS — C162 Malignant neoplasm of body of stomach: Secondary | ICD-10-CM

## 2023-03-11 MED ORDER — CAPECITABINE 500 MG PO TABS
1000.0000 mg/m2 | ORAL_TABLET | Freq: Two times a day (BID) | ORAL | 0 refills | Status: DC
  Filled 2023-03-11: qty 112, 21d supply, fill #0

## 2023-03-11 NOTE — Progress Notes (Signed)
Specialty Pharmacy Refill Coordination Note  Sheryl [name] is a 57 y.o. female contacted today regarding refills of specialty medication(s) Capecitabine   Patient requested Delivery   Delivery date: 03/21/23   Verified address: [address] [place] [address]   Medication will be filled on 03/20/23. *Pending refill request*

## 2023-03-11 NOTE — Progress Notes (Signed)
Specialty Pharmacy Ongoing Clinical Assessment Note  Sheryl [name] is a 57 y.o. female who is being followed by the specialty pharmacy service for RxSp Oncology   Patient's specialty medication(s) reviewed today: Capecitabine   Missed doses in the last 4 weeks: 0   Patient/Caregiver did not have any additional questions or concerns.   Therapeutic benefit summary: Patient is achieving benefit   Adverse events/side effects summary: Experienced adverse events/side effects (peripheral neuropathy, tolerable at this time.)   Patient's therapy is appropriate to: Continue    Goals Addressed             This Visit's Progress    Slow Disease Progression       Patient is on track. Patient will maintain adherence.  Dr. Mosetta Putt documented stable disease at her last office visit.          Follow up:  3 months  Servando Snare Specialty Pharmacist

## 2023-03-12 ENCOUNTER — Encounter: Payer: Self-pay | Admitting: Internal Medicine

## 2023-03-12 ENCOUNTER — Ambulatory Visit: Payer: Medicaid Other | Admitting: Internal Medicine

## 2023-03-12 VITALS — BP 138/86 | HR 78 | Temp 98.1°F | Resp 16 | Ht 68.0 in | Wt 200.6 lb

## 2023-03-12 DIAGNOSIS — Z23 Encounter for immunization: Secondary | ICD-10-CM | POA: Diagnosis not present

## 2023-03-12 DIAGNOSIS — T502X5A Adverse effect of carbonic-anhydrase inhibitors, benzothiadiazides and other diuretics, initial encounter: Secondary | ICD-10-CM | POA: Diagnosis not present

## 2023-03-12 DIAGNOSIS — E876 Hypokalemia: Secondary | ICD-10-CM

## 2023-03-12 DIAGNOSIS — E118 Type 2 diabetes mellitus with unspecified complications: Secondary | ICD-10-CM | POA: Diagnosis not present

## 2023-03-12 DIAGNOSIS — I10 Essential (primary) hypertension: Secondary | ICD-10-CM

## 2023-03-12 DIAGNOSIS — E785 Hyperlipidemia, unspecified: Secondary | ICD-10-CM

## 2023-03-12 DIAGNOSIS — K2100 Gastro-esophageal reflux disease with esophagitis, without bleeding: Secondary | ICD-10-CM

## 2023-03-12 LAB — BASIC METABOLIC PANEL
BUN: 9 mg/dL (ref 6–23)
CO2: 27 meq/L (ref 19–32)
Calcium: 9.6 mg/dL (ref 8.4–10.5)
Chloride: 103 meq/L (ref 96–112)
Creatinine, Ser: 0.61 mg/dL (ref 0.40–1.20)
GFR: 97.74 mL/min (ref >60.00)
Glucose, Bld: 99 mg/dL (ref 70–99)
Potassium: 3.7 meq/L (ref 3.5–5.1)
Sodium: 139 meq/L (ref 135–145)

## 2023-03-12 LAB — URINALYSIS, ROUTINE W REFLEX MICROSCOPIC
Bilirubin Urine: NEGATIVE
Hgb urine dipstick: NEGATIVE
Ketones, ur: NEGATIVE
Leukocytes,Ua: NEGATIVE
Nitrite: NEGATIVE
RBC / HPF: NONE SEEN (ref 0–?)
Specific Gravity, Urine: <=1.005 — AB (ref 1.000–1.030)
Total Protein, Urine: NEGATIVE
Urine Glucose: NEGATIVE
Urobilinogen, UA: 0.2 (ref 0.0–1.0)
WBC, UA: NONE SEEN (ref 0–?)
pH: 6.5 (ref 5.0–8.0)

## 2023-03-12 LAB — MICROALBUMIN / CREATININE URINE RATIO
Creatinine,U: 39.9 mg/dL
Microalb Creat Ratio: 1.8 mg/g (ref 0.0–30.0)
Microalb, Ur: <0.7 mg/dL (ref 0.0–1.9)

## 2023-03-12 LAB — MAGNESIUM: Magnesium: 2.1 mg/dL (ref 1.5–2.5)

## 2023-03-12 LAB — HEMOGLOBIN A1C: Hgb A1c MFr Bld: 5.6 % (ref 4.6–6.5)

## 2023-03-12 LAB — TSH: TSH: 1.09 u[IU]/mL (ref 0.35–5.50)

## 2023-03-12 NOTE — Patient Instructions (Signed)

## 2023-03-12 NOTE — Progress Notes (Unsigned)
Subjective:  Patient ID: Sheryl [name], female    DOB: [birth year]  Age: 57 y.o. MRN: [id number]  CC: Hypertension, Hyperlipidemia, Diabetes, and Gastroesophageal Reflux   HPI Sheryl [name] presents for f/up -   Outpatient Medications Prior to Visit  Medication Sig Dispense Refill   acetaminophen (TYLENOL) 500 MG tablet Take 2 tablets (1,000 mg total) by mouth every 8 (eight) hours as needed (pain). 30 tablet 1   amLODipine (NORVASC) 10 MG tablet Take 1 tablet (10 mg total) by mouth daily. 90 tablet 0   b complex vitamins capsule Take 1 capsule by mouth daily.     capecitabine (XELODA) 500 MG tablet Take 4 tablets (2,000 mg total) by mouth 2 (two) times daily after a meal. Take within 30 minutes after meals. Take for 14 days on, then off for 7 days. Repeat every 21 days. 112 tablet 0   Cyanocobalamin (VITAMIN B 12 PO) Take by mouth.     famotidine (PEPCID) 20 MG tablet Take 1 tablet (20 mg total) by mouth 2 (two) times daily. 60 tablet 2   HYDROcodone-acetaminophen (NORCO/VICODIN) 5-325 MG tablet Take 1 tablet by mouth every 6 (six) hours as needed for moderate pain. 10 tablet 0   lidocaine-prilocaine (EMLA) cream Apply 1 Application topically as needed. 30 g 1   POTASSIUM PO Take 99 mg by mouth daily.     sucralfate (CARAFATE) 1 g tablet Take 1 tablet (1 g total) by mouth 2 (two) times daily. 60 tablet 0   Bacillus Coagulans-Inulin (PROBIOTIC-PREBIOTIC) 1-250 BILLION-MG CAPS Take 2 capsules by mouth daily. 30 capsule 1   Biotin 1000 MCG CHEW Chew 1,000 mcg by mouth daily. 30 tablet 2   Blood Glucose Monitoring Suppl (TRUE METRIX METER) w/Device KIT 1 kit by Does not apply route 3 (three) times daily as needed. 1 kit 0   Cholecalciferol (VITAMIN D3) 125 MCG (5000 UT) TABS Take 1 tablet (5,000 Units total) by mouth daily. 30 tablet 2   diphenhydrAMINE-prednisoLONE-nystatin in lidocaine solution Take 5 mls by mouth 3 (three) times daily as needed for mouth pain. 140 mL 0   ondansetron  (ZOFRAN-ODT) 4 MG disintegrating tablet Take 2 tablets (8 mg total) by mouth every 8 (eight) hours as needed for nausea or vomiting. Dissolve one tab on tongue 30 minutes before each dose of bowel prep 20 tablet 2   potassium chloride SA (KLOR-CON M) 20 MEQ tablet Take 1 tablet (20 mEq total) by mouth 2 (two) times daily. Change to once daily after 5 days, until finish 30 tablet 0   prochlorperazine (COMPAZINE) 10 MG tablet Take 1 tablet (10 mg total) by mouth every 6 (six) hours as needed for nausea or vomiting. 30 tablet 1   rosuvastatin (CRESTOR) 5 MG tablet Take 1 tablet (5 mg total) by mouth daily. 90 tablet 1   0.9 %  sodium chloride infusion      No facility-administered medications prior to visit.    ROS Review of Systems  Objective:  BP 138/86 (BP Location: Left Arm, Patient Position: Sitting, Cuff Size: Normal)   Pulse 78   Temp 98.1 F (36.7 C) (Oral)   Resp 16   Ht 5\' 8"  (1.727 m)   Wt 200 lb 9.6 oz (91 kg)   LMP 06/29/2010   SpO2 95%   BMI 30.50 kg/m   BP Readings from Last 3 Encounters:  03/12/23 138/86  02/20/23 123/87  01/30/23 (!) 137/93    Wt Readings from Last 3 Encounters:  03/12/23 200 lb 9.6 oz (91 kg)  02/20/23 199 lb 11.2 oz (90.6 kg)  01/30/23 197 lb 14.4 oz (89.8 kg)    Physical Exam Vitals reviewed.  Constitutional:      Appearance: Normal appearance.  HENT:     Mouth/Throat:     Mouth: Mucous membranes are moist.  Eyes:     General: No scleral icterus.    Conjunctiva/sclera: Conjunctivae normal.  Cardiovascular:     Rate and Rhythm: Normal rate and regular rhythm.     Heart sounds: No murmur heard.    No friction rub. No gallop.     Comments: EKG- NSR, 70 bpm No LVH, Q waves, or ST/T waves  Pulmonary:     Effort: Pulmonary effort is normal.     Breath sounds: No stridor. No wheezing, rhonchi or rales.  Abdominal:     General: Abdomen is flat.     Palpations: There is no mass.     Tenderness: There is no abdominal tenderness.  There is no guarding.     Hernia: No hernia is present.  Musculoskeletal:        General: Normal range of motion.     Cervical back: Neck supple.     Right lower leg: No edema.     Left lower leg: No edema.  Lymphadenopathy:     Cervical: No cervical adenopathy.  Skin:    General: Skin is warm and dry.     Coloration: Skin is not pale.  Neurological:     General: No focal deficit present.     Mental Status: She is alert. Mental status is at baseline.  Psychiatric:        Mood and Affect: Mood normal.        Behavior: Behavior normal.     Lab Results  Component Value Date   WBC 3.0 (L) 02/26/2023   HGB 13.4 02/26/2023   HCT 37.4 02/26/2023   PLT 233 02/26/2023   GLUCOSE 99 03/12/2023   CHOL 179 12/20/2020   TRIG 90 12/20/2020   HDL 45 12/20/2020   LDLCALC 117 (H) 12/20/2020   ALT 26 02/26/2023   AST 36 02/26/2023   NA 139 03/12/2023   K 3.7 03/12/2023   CL 103 03/12/2023   CREATININE 0.61 03/12/2023   BUN 9 03/12/2023   CO2 27 03/12/2023   TSH 1.09 03/12/2023   INR 1.01 09/18/2010   HGBA1C 5.6 03/12/2023   MICROALBUR <0.7 03/12/2023    CT CHEST ABDOMEN PELVIS W CONTRAST  Result Date: 02/20/2023 CLINICAL DATA:  Gastric carcinoma, assess treatment response EXAM: CT CHEST, ABDOMEN, AND PELVIS WITH CONTRAST TECHNIQUE: Multidetector CT imaging of the chest, abdomen and pelvis was performed following the standard protocol during bolus administration of intravenous contrast. RADIATION DOSE REDUCTION: This exam was performed according to the departmental dose-optimization program which includes automated exposure control, adjustment of the mA and/or kV according to patient size and/or use of iterative reconstruction technique. CONTRAST:  OMNIPAQUE IOHEXOL 300 MG/ML  SOLN COMPARISON:  11/27/2022 and previous FINDINGS: CT CHEST FINDINGS Cardiovascular: Right IJ port catheter to the distal SVC. Heart size normal. No pericardial effusion. Central great vessels grossly  unremarkable. Mediastinum/Nodes: Mild circumferential wall thickening in the distal esophagus an GE junction region as before. Esophageal varices. No mediastinal hematoma, mass, or adenopathy. Lungs/Pleura: No pleural effusion. No pneumothorax. Lungs are clear. Musculoskeletal: No chest wall mass or suspicious bone lesions identified. CT ABDOMEN PELVIS FINDINGS Hepatobiliary: No focal liver abnormality is seen. No  gallstones, gallbladder wall thickening, or biliary dilatation. Pancreas: Unremarkable. No pancreatic ductal dilatation or surrounding inflammatory changes. Spleen: Normal in size without focal abnormality. Adrenals/Urinary Tract: No adrenal mass. Septated 7.9 cm 9 HU probable left renal cyst from the lower pole, stable; no follow-up indicated. No urolithiasis or hydronephrosis. Urinary bladder incompletely distended. Stomach/Bowel: Circumferential soft of wall thickening at the GE junction. Stomach otherwise unremarkable, incompletely distended. Small bowel decompressed. Normal appendix. Colon partially distended, unremarkable. Vascular/Lymphatic: No AAA. No abdominal or pelvic adenopathy. Lobular mass inferior to the spleen with scattered coarse calcifications, 3.8 cm maximum transverse diameter, stable on scans back to 2012. Reproductive: Post hysterectomy. Coarse calcifications in the right adnexal region as before. No adnexal mass. Other: No ascites. Bilateral pelvic phleboliths. No free air. Hazy changes centrally in the mesentery, stable. Musculoskeletal: Facet DJD L4-S1.  No acute findings. IMPRESSION: 1. No evidence of metastatic disease. 2. Circumferential wall thickening at the GE junction as before. 3. Stable 3.8 cm lobular mass inferior to the spleen, stable on scans back to 2012. Electronically Signed   By: Corlis Leak M.D.   On: 02/20/2023 08:08    Assessment & Plan:  Type II diabetes mellitus with manifestations (HCC) -     Basic metabolic panel; Future -     Microalbumin / creatinine  urine ratio; Future -     Hemoglobin A1c; Future -     HM Diabetes Foot Exam -     Ambulatory referral to Ophthalmology  Hyperlipidemia LDL goal <100 -     TSH; Future  Primary hypertension -     Magnesium; Future -     Basic metabolic panel; Future -     TSH; Future -     Urinalysis, Routine w reflex microscopic; Future  Diuretic-induced hypokalemia -     Magnesium; Future -     Basic metabolic panel; Future  Need for immunization against influenza -     Flu vaccine trivalent PF, 6mos and older(Flulaval,Afluria,Fluarix,Fluzone)  Gastroesophageal reflux disease with esophagitis without hemorrhage -     Esomeprazole Magnesium; Take 1 capsule (40 mg total) by mouth daily.  Dispense: 90 capsule; Refill: 1     Follow-up: Return in about 6 months (around 09/09/2023).  Sanda Linger, MD

## 2023-03-13 ENCOUNTER — Other Ambulatory Visit (HOSPITAL_COMMUNITY): Payer: Self-pay

## 2023-03-13 DIAGNOSIS — K2100 Gastro-esophageal reflux disease with esophagitis, without bleeding: Secondary | ICD-10-CM | POA: Insufficient documentation

## 2023-03-13 DIAGNOSIS — Z23 Encounter for immunization: Secondary | ICD-10-CM | POA: Insufficient documentation

## 2023-03-13 MED ORDER — ESOMEPRAZOLE MAGNESIUM 40 MG PO CPDR
40.0000 mg | DELAYED_RELEASE_CAPSULE | Freq: Every day | ORAL | 1 refills | Status: DC
  Filled 2023-03-13: qty 90, 90d supply, fill #0

## 2023-03-14 ENCOUNTER — Other Ambulatory Visit: Payer: Self-pay

## 2023-03-14 ENCOUNTER — Encounter (HOSPITAL_COMMUNITY): Payer: Self-pay

## 2023-03-18 ENCOUNTER — Other Ambulatory Visit: Payer: Self-pay

## 2023-03-19 ENCOUNTER — Encounter: Payer: Self-pay | Admitting: Hematology

## 2023-03-19 NOTE — Assessment & Plan Note (Signed)
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   -she started first line chemo FLOT on 1/10 -She understands that chemotherapy is palliative, to prolong her life.  We are unlikely going to cure her cancer. -PD-L1 0-1%, no significant benefit from PD-L1 immunotherapy, FO revealed FGFR2 amplification and fusion (+), FGFR inhibitors can be considered in future, no other targeted therapy available  -She has been tolerating chemo very well, will continue for now  -PET scan from 05/30/2022 was negative for primary tumor or metastatic disease, the known peritoneal mets did not show on PET.  -she has been tolerating chemo well overall. Due to fatigue, I have changed her chemo from FLOT to FOLFOX on 06/20/2022, she tolerated well -she previously asked the role of surgery, depends on her next restaging CT scan findings, I may refer her to [facility] or [facility] to discuss HIPEC surgery  -due to her infusion reaction to oxaliplatin on C5, we added additional premeds and gave slow infusion over 4 hours for cycle 6 and she tolerated well  -She is not able to return to work due to the cancer and treatment related symptoms.  -She is tolerating FOLFOX well overall, with moderate fatigue for a few days after infusion but able to recover well.  No signs of neuropathy at this point. -Restaging CT abdomen pelvis from August 27, 2022 showed no residual disease.  -We again discussed maintenance therapy with Xeloda down the road, we will stop oxaliplatin when she develops side effects especially neuropathy, or after next scan -repeated staging CT from 11/26/2021 showed stable disease  -I have changed her treatment to maintenance Xeloda in early August 2024, she is tolerating well overall  -her NGS Caris showed positive Claudin 18.2, she is a  candidate for zolbetuximab.

## 2023-03-20 ENCOUNTER — Inpatient Hospital Stay: Payer: Medicaid Other | Attending: Physician Assistant | Admitting: Hematology

## 2023-03-20 ENCOUNTER — Encounter: Payer: Self-pay | Admitting: Hematology

## 2023-03-20 ENCOUNTER — Other Ambulatory Visit (HOSPITAL_COMMUNITY): Payer: Self-pay

## 2023-03-20 ENCOUNTER — Telehealth: Payer: Self-pay | Admitting: Gastroenterology

## 2023-03-20 ENCOUNTER — Inpatient Hospital Stay: Payer: Medicaid Other

## 2023-03-20 VITALS — BP 127/91 | HR 77 | Temp 98.4°F | Resp 18 | Ht 68.0 in | Wt 201.2 lb

## 2023-03-20 DIAGNOSIS — C162 Malignant neoplasm of body of stomach: Secondary | ICD-10-CM

## 2023-03-20 DIAGNOSIS — R070 Pain in throat: Secondary | ICD-10-CM | POA: Diagnosis not present

## 2023-03-20 DIAGNOSIS — Z95828 Presence of other vascular implants and grafts: Secondary | ICD-10-CM

## 2023-03-20 DIAGNOSIS — C169 Malignant neoplasm of stomach, unspecified: Secondary | ICD-10-CM | POA: Diagnosis present

## 2023-03-20 DIAGNOSIS — R42 Dizziness and giddiness: Secondary | ICD-10-CM | POA: Insufficient documentation

## 2023-03-20 DIAGNOSIS — C786 Secondary malignant neoplasm of retroperitoneum and peritoneum: Secondary | ICD-10-CM | POA: Diagnosis present

## 2023-03-20 LAB — CBC WITH DIFFERENTIAL (CANCER CENTER ONLY)
Abs Immature Granulocytes: 0.03 10*3/uL (ref 0.00–0.07)
Basophils Absolute: 0 10*3/uL (ref 0.0–0.1)
Basophils Relative: 0 %
Eosinophils Absolute: 0.1 10*3/uL (ref 0.0–0.5)
Eosinophils Relative: 2 %
HCT: 35.8 % — ABNORMAL LOW (ref 36.0–46.0)
Hemoglobin: 13.3 g/dL (ref 12.0–15.0)
Immature Granulocytes: 1 %
Lymphocytes Relative: 16 %
Lymphs Abs: 0.9 10*3/uL (ref 0.7–4.0)
MCH: 35.8 pg — ABNORMAL HIGH (ref 26.0–34.0)
MCHC: 37.2 g/dL — ABNORMAL HIGH (ref 30.0–36.0)
MCV: 96.2 fL (ref 80.0–100.0)
Monocytes Absolute: 0.5 10*3/uL (ref 0.1–1.0)
Monocytes Relative: 9 %
Neutro Abs: 4 10*3/uL (ref 1.7–7.7)
Neutrophils Relative %: 72 %
Platelet Count: 285 10*3/uL (ref 150–400)
RBC: 3.72 MIL/uL — ABNORMAL LOW (ref 3.87–5.11)
RDW: 16.6 % — ABNORMAL HIGH (ref 11.5–15.5)
WBC Count: 5.6 10*3/uL (ref 4.0–10.5)
nRBC: 0 % (ref 0.0–0.2)

## 2023-03-20 LAB — CMP (CANCER CENTER ONLY)
ALT: 28 U/L (ref 0–44)
AST: 35 U/L (ref 15–41)
Albumin: 4.3 g/dL (ref 3.5–5.0)
Alkaline Phosphatase: 120 U/L (ref 38–126)
Anion gap: 7 (ref 5–15)
BUN: 13 mg/dL (ref 6–20)
CO2: 28 mmol/L (ref 22–32)
Calcium: 9.6 mg/dL (ref 8.9–10.3)
Chloride: 104 mmol/L (ref 98–111)
Creatinine: 0.65 mg/dL (ref 0.44–1.00)
GFR, Estimated: >60 mL/min (ref >60)
Glucose, Bld: 146 mg/dL — ABNORMAL HIGH (ref 70–99)
Potassium: 3.3 mmol/L — ABNORMAL LOW (ref 3.5–5.1)
Sodium: 139 mmol/L (ref 135–145)
Total Bilirubin: 1 mg/dL (ref <1.2)
Total Protein: 7.2 g/dL (ref 6.5–8.1)

## 2023-03-20 MED ORDER — HEPARIN SOD (PORK) LOCK FLUSH 100 UNIT/ML IV SOLN
500.0000 [IU] | Freq: Once | INTRAVENOUS | Status: AC
  Administered 2023-03-20: 500 [IU]

## 2023-03-20 MED ORDER — SODIUM CHLORIDE 0.9% FLUSH
10.0000 mL | Freq: Once | INTRAVENOUS | Status: AC
  Administered 2023-03-20: 10 mL

## 2023-03-20 NOTE — Telephone Encounter (Signed)
I received a message from our oncology team about this patient having chest discomforts and GERD symptoms and abdominal pain of unclear etiology.  She has known metastatic gastric cancer (diffuse).  She has been on chemotherapy.  No repeat endoscopic evaluation has been performed. We are asked to consider an upper endoscopy for further evaluation. I think this is a reasonable request. Have had a cancellation for 11/21 in the afternoon. I called and spoke with the patient and she could potentially be available for a procedure at 2:30 PM. I have asked her to go ahead and remain n.p.o. after midnight other than small sips of fluid for medicines in the morning. I am going to forward this to my team to see if we can make sure that we get the approval for endoscopy and get this taken care of on Thursday if possible. Again this is for tomorrow morning. If it does not work, my team can reach out to the patient and let her know that we will just have to work on scheduling as I have availability otherwise. Patient agrees to trying to be available and happy for the expedited workup if possible.  I will forward this to my team since it is after 5 PM currently.  Patty or covering RN, Please see if we can get urgent approval for endoscopy for history of gastric cancer, abdominal pain, progressive GERD on medication therapy for evaluation of her gastric cancer as recommended by her oncologist which is a reasonable request. If possible, I want to do her case on 11/21 (this Thursday) at 2:30 PM. I told the patient that she would need to be available for the endoscopy and be at [facility] at 1 PM. I hope that this will work out, if not at least we tried.   Corliss Parish, MD [facility] Office # [phone]

## 2023-03-20 NOTE — Progress Notes (Signed)
[facility]   [phone] [phone]   Clinic Follow up Note   Patient Care Team: [name], MD as PCP - General (Internal Medicine) [name], MD as Consulting Physician (Oncology)  Date of Service:  03/20/2023  CHIEF COMPLAINT: f/u of gastric cancer  CURRENT THERAPY:  Xeloda   Oncology History   Gastric cancer (HCC) cT2N0M1 with peritoneal metastasis. 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   -she started first line chemo FLOT on 1/10 -She understands that chemotherapy is palliative, to prolong her life.  We are unlikely going to cure her cancer. -PD-L1 0-1%, no significant benefit from PD-L1 immunotherapy, FO revealed FGFR2 amplification and fusion (+), FGFR inhibitors can be considered in future, no other targeted therapy available  -She has been tolerating chemo very well, will continue for now  -PET scan from 05/30/2022 was negative for primary tumor or metastatic disease, the known peritoneal mets did not show on PET.  -she has been tolerating chemo well overall. Due to fatigue, I have changed her chemo from FLOT to FOLFOX on 06/20/2022, she tolerated well -she previously asked the role of surgery, depends on her next restaging CT scan findings, I may refer her to [facility] or [facility] to discuss HIPEC surgery  -due to her infusion reaction to oxaliplatin on C5, we added additional premeds and gave slow infusion over 4 hours for cycle 6 and she tolerated well  -She is not able to return to work due to the cancer and treatment related symptoms.  -She is tolerating FOLFOX well overall, with moderate fatigue for a few days after infusion but able to recover well.  No signs of neuropathy at this point. -Restaging CT abdomen pelvis from August 27, 2022 showed no residual disease.  -We  again discussed maintenance therapy with Xeloda down the road, we will stop oxaliplatin when she develops side effects especially neuropathy, or after next scan -repeated staging CT from 11/26/2021 showed stable disease  -I have changed her treatment to maintenance Xeloda in early August 2024, she is tolerating well overall  -her NGS Caris showed positive Claudin 18.2, she is a candidate for zolbetuximab.    Assessment and Plan    Metastatic Gastric Cancer Experiencing new and worsening intermittent chest and back pain, associated with eating, concerning for potential progression. Last CT scan a month ago appeared stable, but cancer inside the stomach and small nodules outside the stomach are difficult to evaluate on CT. Off oxaliplatin for several months, which may be contributing to worsening symptoms. Blood counts are normal.  -I reviewed her NexGen or sequencing Caris results, which showed a positive Claudin 18.2 in her tumor, so she is a candidate for zolbetuximab, discussed potential side effects (nausea, vomiting) and its efficacy in improving cancer outcomes. Patient prefers to decide on resuming oxaliplatin after the holidays. - Discuss resuming oxaliplatin after the holidays, will probably add zolbetuximab at same time  - Consider endoscopy to evaluate additional causes of pain - Message Dr. Sunday Corn to schedule endoscopy for workup of her epigastric and chest pain - Continue current chemotherapy regimen with dose reduction to three tablets twice a day to see if her pain improves - Provide information on zolbetuximab for review  Gastroesophageal Reflux Disease (GERD) Significant throat and chest discomfort after eating, consistent with GERD. Prescribed Nexium by another physician, but it interacts with chemotherapy.  Using Pepcid, Tums, and other OTC medications with limited relief. Discussed dietary modifications including avoiding acidic foods and eating small, frequent meals. -  Continue using Pepcid and Tums - Avoid acidic foods (e.g., tomato sauce, orange juice, lemonade) - Eat well-cooked food in small, frequent meals - Monitor and report symptom changes  General Health Maintenance Experiencing lightheadedness and blurry vision, possibly related to overall health status and cancer treatment. - Monitor symptoms of lightheadedness and blurry vision - Sit down and rest when experiencing these symptoms  Lab -reviewed her Caris result, and option of zolbetuximab -Will reduce Xeloda to 2 tablets in the morning and 3 tabs in the evening due to her recent abdominal and chest pain -I have messaged Dr. Meridee Score to see if we can repeat her EGD - Follow up in three weeks.         SUMMARY OF ONCOLOGIC HISTORY: Oncology History Overview Note   Cancer Staging  Gastric cancer [facility]) Staging form: Stomach, AJCC 8th Edition - Clinical stage from 04/19/2022: Stage IVB (cT2, cN0, pM1) - Signed by [name], MD on 05/08/2022 Total positive nodes: 0     Gastric cancer (HCC)  03/30/2022 Procedure   EGD:  Impression:  - Normal esophagus. - A few gastric polyps. Biopsied. - Gastritis. Biopsied. - Non-bleeding gastric ulcer with no stigmata of bleeding. Biopsied. - Normal examined duodenum. Biopsied.  Findings: Diffuse moderate inflammation characterized by congestion (edema), friability and granularity was found in the cardia, in the gastric fundus and in the gastric body. There were associated erosions in multiple places. Biopsies were taken from the antrum, body, and fundus with a cold forceps for histology. Estimated blood loss was minimal.  One non-bleeding cratered gastric ulcer with no stigmata of bleeding was found on the greater curvature of the stomach. The lesion was 6 mm in largest dimension. The mucosa around the ulcer was heaped and led to some deformity in the antrum. Biopsies were taken with a cold forceps for histology. Estimated blood loss was  minimal.    03/30/2022 Pathology Results   [name], Sheryl [name]  Accession: [id number]  Diagnosis 1. Surgical [P], duodenal - BENIGN SMALL BOWEL MUCOSA WITH NO SIGNIFICANT PATHOLOGIC CHANGES 2. Surgical [P], gastric antrum - GASTRIC ANTRAL MUCOSA WITH FEATURES OF REACTIVE GASTROPATHY - NEGATIVE FOR H. PYLORI ON H&E STAIN - NEGATIVE FOR INTESTINAL METAPLASIA OR MALIGNANCY 3. Surgical [P], gastric body - GASTRIC OXYNTIC MUCOSA WITH REACTIVE/REPARATIVE CHANGES - NEGATIVE FOR H. PYLORI ON H&E STAIN - NEGATIVE FOR INTESTINAL METAPLASIA, DYSPLASIA OR MALIGNANCY 4. Surgical [P], greater curve ulceration - ADENOCARCINOMA WITH SIGNET RING CELL FEATURES (SEE NOTE) 5. Surgical [P], gastric polyps - ADENOCARCINOMA WITH SIGNET RING CELL FEATURES (SEE NOTE) 6. Surgical [P], fundus (gastric) - ADENOCARCINOMA WITH SIGNET RING CELL FEATURES (SEE NOTE) 7. Surgical [P], colon, ascending, polyp (1) - TUBULAR ADENOMA. - NO HIGH GRADE DYSPLASIA OR MALIGNANCY. 8. Surgical [P], colon, transverse, polyp (1) - TUBULAR ADENOMA. - NO HIGH GRADE DYSPLASIA OR MALIGNANCY.    04/13/2022 Initial Diagnosis   Gastric cancer (HCC)   04/19/2022 Cancer Staging   Staging form: Stomach, AJCC 8th Edition - Clinical stage from 04/19/2022: Stage IVB (cT2, cN0, pM1) - Signed by [name], MD on 05/08/2022 Total positive nodes: 0   05/05/2022 Genetic Testing   Negative genetic testing on the Multi-cancer gene panel + RNA.  FH c.259C>T VUS identified.  The report date is May 05, 2022.  The Multi-Cancer + RNA Panel offered by [facility] includes sequencing and/or deletion/duplication  analysis of the following 70 genes:  AIP*, ALK, APC*, ATM*, AXIN2*, BAP1*, BARD1*, BLM*, BMPR1A*, BRCA1*, BRCA2*, BRIP1*, CDC73*, CDH1*, CDK4, CDKN1B*, CDKN2A, CHEK2*, CTNNA1*, DICER1*, EPCAM (del/dup only), EGFR, FH*, FLCN*, GREM1 (promoter dup only), HOXB13, KIT, LZTR1, MAX*, MBD4, MEN1*, MET, MITF, MLH1*, MSH2*, MSH3*, MSH6*, MUTYH*,  NF1*, NF2*, NTHL1*, PALB2*, PDGFRA, PMS2*, POLD1*, POLE*, POT1*, PRKAR1A*, PTCH1*, PTEN*, RAD51C*, RAD51D*, RB1*, RET, SDHA* (sequencing only), SDHAF2*, SDHB*, SDHC*, SDHD*, SMAD4*, SMARCA4*, SMARCB1*, SMARCE1*, STK11*, SUFU*, TMEM127*, TP53*, TSC1*, TSC2*, VHL*. RNA analysis is performed for * genes.    05/09/2022 - 06/07/2022 Chemotherapy   Patient is on Treatment Plan : GASTROESOPHAGEAL FLOT q14d X 4 cycles      Miscellaneous   Foundation One  Biomarker Findings Microsatellite status- Cannot be determined Tumor Mutational Burden- Cannot be determined  Genomic Findings  FGFR2 amplification,FGFR2-TACC2 fusion,  Rearrangement intron 17 ARAF amplification CCND3 amplification TP53 [id number]*74     05/30/2022 Imaging    IMPRESSION: 1. Mild hypermetabolism corresponding to a dominant left upper quadrant mass and smaller perigastric nodules or nodes. 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 Recommend attention to the dominant left upper quadrant soft tissue mass on follow-up exams to exclude unlikely recurrent lymphoma. 2. No gastric hypermetabolism and no typical findings of metastatic disease.   06/20/2022 -  Chemotherapy   Patient is on Treatment Plan : GASTRIC FOLFOX q14d x 12 cycles     08/27/2022 Imaging    IMPRESSION: No focal gastric mass on CT.   No findings suspicious for recurrent or metastatic disease.   Stable left upper abdominal soft tissue lesion and small lymph nodes, chronic, favoring treated lymphoma.   11/27/2022 Imaging    IMPRESSION: 1. Questionable thickening of the distal esophagus/GE junction and gastric antrum, consider further evaluation with endoscopy. 2. Chronically stable left upper quadrant nodularity and prominent lymph nodes again favored treated lymphoma. Continued attention on follow-up imaging suggested. 3. No convincing evidence of metastatic disease in the chest, abdomen or pelvis. 4. Questionable  asymmetric wall thickening of the rectum, consider further evaluation with colonoscopy. 5. Mild wall thickening of a nondistended urinary bladder, correlate with urinalysis to exclude cystitis. 6. Hepatic steatosis.      Discussed the use of AI scribe software for clinical note transcription with the patient, who gave verbal consent to proceed.  History of Present Illness   A 57 year old patient with a history of metastatic gastric cancer presents with new discomfort in the throat and chest, along with pain in the stomach and back. The discomfort and pain have been occurring intermittently, with episodes of increased severity. The patient reports that the pain often radiates from the chest to the back. The patient has been managing the discomfort with over-the-counter medications such as Pepcid and Tums, but reports that these have not been effective in alleviating the pain. The patient also reports episodes of lightheadedness and blurry vision, which have led to instances of near fainting. The patient's appetite has been affected, with a decreased desire to eat due to the associated pain. The patient's weight, however, has remained stable. The patient is currently on chemotherapy, which she takes in pill form.         All other systems were reviewed with the patient and are negative.  MEDICAL HISTORY:  Past Medical History:  Diagnosis Date   Blood transfusion without reported diagnosis    had transfusion with hysterectomy   Cataract    Colon polyps 2012   Diabetes (HCC)  03/13/2021   Diabetes (HCC) 05/21/2019   Family history of breast cancer    Family history of pancreatic cancer    Family history of stomach cancer    Fibroid    gastric ca 03/2022   GERD (gastroesophageal reflux disease)    H/O blood clots    History of hysterectomy    fibroids and heavy cycles   Hypertension     SURGICAL HISTORY: Past Surgical History:  Procedure Laterality Date   ABDOMINAL HYSTERECTOMY      BIOPSY  04/19/2022   Procedure: BIOPSY;  Surgeon: Lemar Lofty., MD;  Location: [facility];  Service: Gastroenterology;;   COLONOSCOPY     ESOPHAGOGASTRODUODENOSCOPY (EGD) WITH PROPOFOL N/A 04/19/2022   Procedure: ESOPHAGOGASTRODUODENOSCOPY (EGD) WITH PROPOFOL;  Surgeon: Lemar Lofty., MD;  Location: [facility];  Service: Gastroenterology;  Laterality: N/A;   EUS N/A 04/19/2022   Procedure: UPPER ENDOSCOPIC ULTRASOUND (EUS) RADIAL;  Surgeon: Lemar Lofty., MD;  Location: [facility];  Service: Gastroenterology;  Laterality: N/A;   EXCISION OF SKIN TAG  05/03/2022   Procedure: EXCISION OF CHEST WALL SKIN LESION;  Surgeon: Fritzi Mandes, MD;  Location: [facility] OR;  Service: General;;   LAPAROSCOPY N/A 05/03/2022   Procedure: LAPAROSCOPY DIAGNOSTIC WITH PERITONEAL WASHINGS;  Surgeon: Fritzi Mandes, MD;  Location: [facility] OR;  Service: General;  Laterality: N/A;   POLYPECTOMY  04/19/2022   Procedure: POLYPECTOMY;  Surgeon: Lemar Lofty., MD;  Location: [facility];  Service: Gastroenterology;;   PORTACATH PLACEMENT N/A 05/03/2022   Procedure: INSERTION PORT-A-CATH WITH ULTRASOUND GUIDANCE;  Surgeon: Fritzi Mandes, MD;  Location: [facility] OR;  Service: General;  Laterality: N/A;   UPPER GASTROINTESTINAL ENDOSCOPY      I have reviewed the social history and family history with the patient and they are unchanged from previous note.  ALLERGIES:  is allergic to aspirin, cyclobenzaprine, naproxen sodium, zithromax [azithromycin dihydrate], oxaliplatin, and dilaudid [hydromorphone].  MEDICATIONS:  Current Outpatient Medications  Medication Sig Dispense Refill   acetaminophen (TYLENOL) 500 MG tablet Take 2 tablets (1,000 mg total) by mouth every 8 (eight) hours as needed (pain). 30 tablet 1   amLODipine (NORVASC) 10 MG tablet Take 1 tablet (10 mg total) by mouth daily. 90 tablet 0   b complex vitamins capsule Take 1 capsule by mouth daily.     capecitabine (XELODA)  500 MG tablet Take 4 tablets (2,000 mg total) by mouth 2 (two) times daily after a meal. Take within 30 minutes after meals. Take for 14 days on, then off for 7 days. Repeat every 21 days. 112 tablet 0   Cyanocobalamin (VITAMIN B 12 PO) Take by mouth.     famotidine (PEPCID) 20 MG tablet Take 1 tablet (20 mg total) by mouth 2 (two) times daily. 60 tablet 2   HYDROcodone-acetaminophen (NORCO/VICODIN) 5-325 MG tablet Take 1 tablet by mouth every 6 (six) hours as needed for moderate pain. 10 tablet 0   lidocaine-prilocaine (EMLA) cream Apply 1 Application topically as needed. 30 g 1   POTASSIUM PO Take 99 mg by mouth daily.     sucralfate (CARAFATE) 1 g tablet Take 1 tablet (1 g total) by mouth 2 (two) times daily. 60 tablet 0   No current facility-administered medications for this visit.    PHYSICAL EXAMINATION: ECOG PERFORMANCE STATUS: 1 - Symptomatic but completely ambulatory  Vitals:   03/20/23 0912  BP: (!) 127/91  Pulse: 77  Resp: 18  Temp: 98.4 F (36.9 C)  SpO2: 100%  Wt Readings from Last 3 Encounters:  03/20/23 201 lb 3.2 oz (91.3 kg)  03/12/23 200 lb 9.6 oz (91 kg)  02/20/23 199 lb 11.2 oz (90.6 kg)     GENERAL:alert, no distress and comfortable SKIN: skin color, texture, turgor are normal, no rashes or significant lesions EYES: normal, Conjunctiva are pink and non-injected, sclera clear NECK: supple, thyroid normal size, non-tender, without nodularity LYMPH:  no palpable lymphadenopathy in the cervical, axillary  LUNGS: clear to auscultation and percussion with normal breathing effort HEART: regular rate & rhythm and no murmurs and no lower extremity edema ABDOMEN:abdomen soft, non-tender and normal bowel sounds Musculoskeletal:no cyanosis of digits and no clubbing  NEURO: alert & oriented x 3 with fluent speech, no focal motor/sensory deficits    LABORATORY DATA:  I have reviewed the data as listed    Latest Ref Rng & Units 03/20/2023    8:53 AM 02/26/2023    10:33 AM 02/20/2023    9:21 AM  CBC  WBC 4.0 - 10.5 K/uL 5.6  3.0  2.9   Hemoglobin 12.0 - 15.0 g/dL 69.6  29.5  28.4   Hematocrit 36.0 - 46.0 % 35.8  37.4  35.7   Platelets 150 - 400 K/uL 285  233  248         Latest Ref Rng & Units 03/20/2023    8:53 AM 03/12/2023    2:07 PM 02/26/2023   10:33 AM  CMP  Glucose 70 - 99 mg/dL 132  99  440   BUN 6 - 20 mg/dL 13  9  10    Creatinine 0.44 - 1.00 mg/dL 1.02  7.25  3.66   Sodium 135 - 145 mmol/L 139  139  141   Potassium 3.5 - 5.1 mmol/L 3.3  3.7  3.3   Chloride 98 - 111 mmol/L 104  103  105   CO2 22 - 32 mmol/L 28  27  30    Calcium 8.9 - 10.3 mg/dL 9.6  9.6  9.3   Total Protein 6.5 - 8.1 g/dL 7.2   7.1   Total Bilirubin <1.2 mg/dL 1.0   1.5   Alkaline Phos 38 - 126 U/L 120   109   AST 15 - 41 U/L 35   36   ALT 0 - 44 U/L 28   26       RADIOGRAPHIC STUDIES: I have personally reviewed the radiological images as listed and agreed with the findings in the report. No results found.    No orders of the defined types were placed in this encounter.  All questions were answered. The patient knows to call the clinic with any problems, questions or concerns. No barriers to learning was detected. The total time spent in the appointment was 40 minutes.     [name], MD 03/20/2023

## 2023-03-21 ENCOUNTER — Ambulatory Visit (HOSPITAL_COMMUNITY)
Admission: RE | Admit: 2023-03-21 | Discharge: 2023-03-21 | Disposition: A | Discharge status: Home / Self Care | Payer: Medicaid Other | Attending: Gastroenterology | Admitting: Gastroenterology

## 2023-03-21 ENCOUNTER — Other Ambulatory Visit: Payer: Self-pay

## 2023-03-21 ENCOUNTER — Encounter (HOSPITAL_COMMUNITY): Payer: Self-pay | Admitting: Gastroenterology

## 2023-03-21 ENCOUNTER — Encounter (HOSPITAL_COMMUNITY)
Admission: RE | Disposition: A | Discharge status: Home / Self Care | Payer: Self-pay | Source: Home / Self Care | Attending: Gastroenterology

## 2023-03-21 ENCOUNTER — Ambulatory Visit (HOSPITAL_COMMUNITY): Payer: Medicaid Other | Admitting: Anesthesiology

## 2023-03-21 DIAGNOSIS — K259 Gastric ulcer, unspecified as acute or chronic, without hemorrhage or perforation: Secondary | ICD-10-CM

## 2023-03-21 DIAGNOSIS — Z79899 Other long term (current) drug therapy: Secondary | ICD-10-CM | POA: Insufficient documentation

## 2023-03-21 DIAGNOSIS — R0789 Other chest pain: Secondary | ICD-10-CM | POA: Diagnosis not present

## 2023-03-21 DIAGNOSIS — I10 Essential (primary) hypertension: Secondary | ICD-10-CM | POA: Insufficient documentation

## 2023-03-21 DIAGNOSIS — Z800 Family history of malignant neoplasm of digestive organs: Secondary | ICD-10-CM | POA: Insufficient documentation

## 2023-03-21 DIAGNOSIS — R933 Abnormal findings on diagnostic imaging of other parts of digestive tract: Secondary | ICD-10-CM | POA: Diagnosis not present

## 2023-03-21 DIAGNOSIS — K219 Gastro-esophageal reflux disease without esophagitis: Secondary | ICD-10-CM | POA: Insufficient documentation

## 2023-03-21 DIAGNOSIS — K2970 Gastritis, unspecified, without bleeding: Secondary | ICD-10-CM | POA: Diagnosis not present

## 2023-03-21 DIAGNOSIS — C799 Secondary malignant neoplasm of unspecified site: Secondary | ICD-10-CM | POA: Diagnosis not present

## 2023-03-21 DIAGNOSIS — R12 Heartburn: Secondary | ICD-10-CM | POA: Insufficient documentation

## 2023-03-21 DIAGNOSIS — C168 Malignant neoplasm of overlapping sites of stomach: Secondary | ICD-10-CM | POA: Diagnosis not present

## 2023-03-21 DIAGNOSIS — C169 Malignant neoplasm of stomach, unspecified: Secondary | ICD-10-CM

## 2023-03-21 HISTORY — PX: ESOPHAGOGASTRODUODENOSCOPY (EGD) WITH PROPOFOL: SHX5813

## 2023-03-21 HISTORY — PX: BIOPSY: SHX5522

## 2023-03-21 SURGERY — ESOPHAGOGASTRODUODENOSCOPY (EGD) WITH PROPOFOL
Anesthesia: Monitor Anesthesia Care

## 2023-03-21 MED ORDER — SODIUM CHLORIDE 0.9 % IV SOLN: INTRAVENOUS | Status: DC

## 2023-03-21 MED ORDER — LIDOCAINE HCL (CARDIAC) PF 100 MG/5ML IV SOSY
PREFILLED_SYRINGE | INTRAVENOUS | Status: DC | PRN
  Administered 2023-03-21: 80 mg via INTRATRACHEAL

## 2023-03-21 MED ORDER — LACTATED RINGERS IV SOLN: INTRAVENOUS | Status: DC | PRN

## 2023-03-21 MED ORDER — GLYCOPYRROLATE 0.2 MG/ML IJ SOLN
INTRAMUSCULAR | Status: DC | PRN
  Administered 2023-03-21: .2 mg via INTRAVENOUS

## 2023-03-21 MED ORDER — DEXMEDETOMIDINE HCL IN NACL 80 MCG/20ML IV SOLN
INTRAVENOUS | Status: DC | PRN
  Administered 2023-03-21: 6 ug via INTRAVENOUS

## 2023-03-21 MED ORDER — PROPOFOL 500 MG/50ML IV EMUL
INTRAVENOUS | Status: DC | PRN
  Administered 2023-03-21: 100 ug/kg/min via INTRAVENOUS
  Administered 2023-03-21 (×2): 50 mg via INTRAVENOUS

## 2023-03-21 MED ORDER — HEPARIN SOD (PORK) LOCK FLUSH 100 UNIT/ML IV SOLN
500.0000 [IU] | INTRAVENOUS | Status: AC | PRN
  Administered 2023-03-21: 500 [IU]

## 2023-03-21 MED ORDER — FENTANYL CITRATE (PF) 100 MCG/2ML IJ SOLN
INTRAMUSCULAR | Status: AC
  Filled 2023-03-21: qty 2

## 2023-03-21 MED ORDER — SUCRALFATE 1 G PO TABS
1.0000 g | ORAL_TABLET | Freq: Two times a day (BID) | ORAL | 6 refills | Status: DC
  Filled 2023-03-21: qty 60, 30d supply, fill #0
  Filled 2023-04-25: qty 60, 30d supply, fill #1
  Filled 2023-05-27: qty 60, 30d supply, fill #2
  Filled 2023-06-25: qty 60, 30d supply, fill #3
  Filled 2023-07-25: qty 60, 30d supply, fill #4
  Filled 2023-08-26: qty 60, 30d supply, fill #5
  Filled 2023-09-25: qty 60, 30d supply, fill #6

## 2023-03-21 MED ORDER — FENTANYL CITRATE (PF) 100 MCG/2ML IJ SOLN
50.0000 ug | Freq: Once | INTRAMUSCULAR | Status: AC
  Administered 2023-03-21: 50 ug via INTRAVENOUS

## 2023-03-21 SURGICAL SUPPLY — 14 items
BLOCK BITE 60FR ADLT L/F BLUE (MISCELLANEOUS) ×3 IMPLANT
ELECT REM PT RETURN 9FT ADLT (ELECTROSURGICAL)
ELECTRODE REM PT RTRN 9FT ADLT (ELECTROSURGICAL) IMPLANT
FORCEP RJ3 GP 1.8X160 W-NEEDLE (CUTTING FORCEPS) IMPLANT
FORCEPS BIOP RAD 4 LRG CAP 4 (CUTTING FORCEPS) IMPLANT
NDL SCLEROTHERAPY 25GX240 (NEEDLE) IMPLANT
NEEDLE SCLEROTHERAPY 25GX240 (NEEDLE) IMPLANT
PROBE APC STR FIRE (PROBE) IMPLANT
PROBE INJECTION GOLD 7FR (MISCELLANEOUS) IMPLANT
SNARE SHORT THROW 13M SML OVAL (MISCELLANEOUS) IMPLANT
SYR 50ML LL SCALE MARK (SYRINGE) IMPLANT
TUBING ENDO SMARTCAP PENTAX (MISCELLANEOUS) ×6 IMPLANT
TUBING IRRIGATION ENDOGATOR (MISCELLANEOUS) ×3 IMPLANT
WATER STERILE IRR 1000ML POUR (IV SOLUTION) IMPLANT

## 2023-03-21 NOTE — Op Note (Signed)
[facility] Patient Name: Sheryl [name] Procedure Date: 03/21/2023 MRN: [id number] Attending MD: Corliss Parish , MD, [id number] Date of Birth: 1965-12-29 CSN: [id number] Age: 57 Admit Type: Outpatient Procedure:                Upper GI endoscopy Indications:              Heartburn, Exclusion of gastro-esophageal reflux                            disease, Malignant adenocarcinoma of the stomach,                            Follow-up of malignant adenocarcinoma of the                            stomach, Abnormal CT of the GI tract Providers:                Corliss Parish, MD, Norman Clay, RN, Harrington Challenger,                            Technician Referring MD:             [name] Medicines:                Monitored Anesthesia Care Complications:            No immediate complications. Estimated Blood Loss:     Estimated blood loss was minimal. Procedure:                Pre-Anesthesia Assessment:                           - Prior to the procedure, a History and Physical                            was performed, and patient medications and                            allergies were reviewed. The patient's tolerance of                            previous anesthesia was also reviewed. The risks                            and benefits of the procedure and the sedation                            options and risks were discussed with the patient.                            All questions were answered, and informed consent                            was obtained. Prior Anticoagulants: The patient has  taken no anticoagulant or antiplatelet agents. ASA                            Grade Assessment: III - A patient with severe                            systemic disease. After reviewing the risks and                            benefits, the patient was deemed in satisfactory                            condition to undergo the procedure.                            After obtaining informed consent, the endoscope was                            passed under direct vision. Throughout the                            procedure, the patient's blood pressure, pulse, and                            oxygen saturations were monitored continuously. The                            [id number]-H190 ([id number]) Olympus endoscope was introduced                            through the mouth, and advanced to the second part                            of duodenum. The upper GI endoscopy was                            accomplished without difficulty. The patient                            tolerated the procedure. Scope In: Scope Out: Findings:      No gross lesions were noted in the entire esophagus.      The Z-line was irregular and was found 40 cm from the incisors.      One non-bleeding superficial gastric ulceration was found at the       gastroesophageal junction and in the cardia. The lesion was 10 mm in       largest dimension. Biopsies were taken with a cold forceps for histology.      One non-bleeding cratered gastric ulceration was found at the incisura.       The lesion was 18 mm in largest dimension. Biopsies were taken with a       cold forceps for histology.      Three non-bleeding cratered gastric ulcerations were found in the       gastric antrum. The largest lesion was 15 mm in largest  dimension.       Biopsies were taken with a cold forceps for histology.      Striped moderately erythematous mucosa without bleeding was found in the       entire examined stomach. Biopsies were taken with a cold forceps for       histology. Biopsies were taken with a cold forceps for histology.       Biopsies were taken with a cold forceps for histology.      An angulation deformity was found in the prepyloric region of the       stomach.      No gross lesions were noted in the duodenal bulb, in the first portion       of the duodenum and in the second portion of the  duodenum. Impression:               - No gross lesions in the entire esophagus. Z-line                            irregular, 40 cm from the incisors.                           - Non-bleeding gastric ulceration at GEJxn/Cardia.                            Biopsied.                           - Non-bleeding gastric ulceration at Incisura.                            Biopsied.                           - 3 non-bleeding gastric ulcerations at Antrum.                            Biopsied.                           - Striped erythematous mucosa in the stomach.                            Biopsied.                           - Angulation deformity in the prepyloric region of                            the stomach.                           - No gross lesions in the duodenal bulb, in the                            first portion of the duodenum and in the second                            portion  of the duodenum. Moderate Sedation:      Not Applicable - Patient had care per Anesthesia. Recommendation:           - The patient will be observed post-procedure,                            until all discharge criteria are met.                           - Discharge patient to home.                           - Patient has a contact number available for                            emergencies. The signs and symptoms of potential                            delayed complications were discussed with the                            patient. Return to normal activities tomorrow.                            Written discharge instructions were provided to the                            patient.                           - Resume previous diet.                           - Continue present medications.                           - Query if a different PPI may be considered (will                            need to discuss with [facility]cology Pharmacy) vs Voquenza                            use if PPI remains contraindicated  based on her                            Chemotherapy regimen.                           - Continue Carafate for now. Increase to 4 times                            daily for 2-weeks then back to twice daily.                           - Continue present medications.                           -  Await pathology results.                           - The findings and recommendations were discussed                            with the patient.                           - The findings and recommendations were discussed                            with the referring physician. Procedure Code(s):        --- Professional ---                           [phone], Esophagogastroduodenoscopy, flexible,                            transoral; with biopsy, single or multiple Diagnosis Code(s):        --- Professional ---                           K22.89, Other specified disease of esophagus                           K25.9, Gastric ulcer, unspecified as acute or                            chronic, without hemorrhage or perforation                           K31.89, Other diseases of stomach and duodenum                           R12, Heartburn                           C16.9, Malignant neoplasm of stomach, unspecified                           R93.3, Abnormal findings on diagnostic imaging of                            other parts of digestive tract CPT copyright 2022 American Medical Association. All rights reserved. The codes documented in this report are preliminary and upon coder review may  be revised to meet current compliance requirements. Corliss Parish, MD 03/21/2023 3:16:30 PM Number of Addenda: 0

## 2023-03-21 NOTE — Discharge Instructions (Signed)

## 2023-03-21 NOTE — Transfer of Care (Addendum)
Immediate Anesthesia Transfer of Care Note  Patient: Ragen Rowsell Perko  Procedure(s) Performed: ESOPHAGOGASTRODUODENOSCOPY (EGD) WITH PROPOFOL BIOPSY  Patient Location: PACU  Anesthesia Type:MAC  Level of Consciousness: awake  Airway & Oxygen Therapy: Patient Spontanous Breathing and Patient connected to face mask oxygen  Post-op Assessment: Report given to RN and Post -op Vital signs reviewed and stable  Post vital signs: Reviewed and stable  Last Vitals:  Vitals Value Taken Time  BP 144/91 03/21/23 1450  Temp    Pulse 110 03/21/23 1451  Resp 25 03/21/23 1451  SpO2 100 % 03/21/23 1451  Vitals shown include unfiled device data.  Last Pain:  Vitals:   03/21/23 1330  TempSrc: Tympanic  PainSc: 0-No pain         Complications: No notable events documented.

## 2023-03-21 NOTE — Telephone Encounter (Signed)
Inbound call from patient requesting a call to discuss if the IV will go through her port or her arm. Please advise, thank you.

## 2023-03-21 NOTE — H&P (Signed)
GASTROENTEROLOGY PROCEDURE H&P NOTE   Primary Care Physician: [name], MD  HPI: Sheryl [name] is a 57 y.o. female who presents for EGD for evaluation of pyrosis/GERD/Atypical chest pain with known history of metastatic diffuse gastric cancer.  Past Medical History:  Diagnosis Date   Blood transfusion without reported diagnosis    had transfusion with hysterectomy   Cataract    Colon polyps 2012   Diabetes (HCC) 03/13/2021   Diabetes (HCC) 05/21/2019   Family history of breast cancer    Family history of pancreatic cancer    Family history of stomach cancer    Fibroid    gastric ca 03/2022   GERD (gastroesophageal reflux disease)    H/O blood clots    History of hysterectomy    fibroids and heavy cycles   Hypertension    Past Surgical History:  Procedure Laterality Date   ABDOMINAL HYSTERECTOMY     BIOPSY  04/19/2022   Procedure: BIOPSY;  Surgeon: Lemar Lofty., MD;  Location: [facility];  Service: Gastroenterology;;   COLONOSCOPY     ESOPHAGOGASTRODUODENOSCOPY (EGD) WITH PROPOFOL N/A 04/19/2022   Procedure: ESOPHAGOGASTRODUODENOSCOPY (EGD) WITH PROPOFOL;  Surgeon: Lemar Lofty., MD;  Location: [facility];  Service: Gastroenterology;  Laterality: N/A;   EUS N/A 04/19/2022   Procedure: UPPER ENDOSCOPIC ULTRASOUND (EUS) RADIAL;  Surgeon: Lemar Lofty., MD;  Location: [facility];  Service: Gastroenterology;  Laterality: N/A;   EXCISION OF SKIN TAG  05/03/2022   Procedure: EXCISION OF CHEST WALL SKIN LESION;  Surgeon: Fritzi Mandes, MD;  Location: [facility] OR;  Service: General;;   LAPAROSCOPY N/A 05/03/2022   Procedure: LAPAROSCOPY DIAGNOSTIC WITH PERITONEAL WASHINGS;  Surgeon: Fritzi Mandes, MD;  Location: [facility] OR;  Service: General;  Laterality: N/A;   POLYPECTOMY  04/19/2022   Procedure: POLYPECTOMY;  Surgeon: Lemar Lofty., MD;  Location: [facility];  Service: Gastroenterology;;   PORTACATH PLACEMENT N/A 05/03/2022    Procedure: INSERTION PORT-A-CATH WITH ULTRASOUND GUIDANCE;  Surgeon: Fritzi Mandes, MD;  Location: [facility] OR;  Service: General;  Laterality: N/A;   UPPER GASTROINTESTINAL ENDOSCOPY     Current Facility-Administered Medications  Medication Dose Route Frequency Provider Last Rate Last Admin   0.9 %  sodium chloride infusion   Intravenous Continuous Mansouraty, Netty Starring., MD        Current Facility-Administered Medications:    0.9 %  sodium chloride infusion, , Intravenous, Continuous, Mansouraty, Netty Starring., MD Allergies  Allergen Reactions   Aspirin Anaphylaxis   Cyclobenzaprine Anaphylaxis   Naproxen Sodium Anaphylaxis   Zithromax [Azithromycin Dihydrate] Anaphylaxis   Oxaliplatin Other (See Comments)    Nausea, Scratchy/tight throat, flushing, diaphoretic.See progress note from 07/04/22   Dilaudid [Hydromorphone] Nausea And Vomiting   Family History  Problem Relation Age of Onset   Stroke Mother    Diabetes Mother    Hypertension Mother    Multiple myeloma Mother    Stroke Father    Pancreatic cancer Maternal Aunt    Stomach cancer Maternal Uncle    Breast cancer Paternal Aunt    Stomach cancer Paternal Aunt    Stomach cancer Paternal Uncle    Heart attack Maternal Grandmother    Breast cancer Paternal Grandmother    Diabetes Other    Hypertension Other    Stroke Other    Cancer Other    Heart attack Other    Esophageal cancer Neg Hx    Liver disease Neg Hx    Colon cancer Neg  Hx    Rectal cancer Neg Hx    Social History   Socioeconomic History   Marital status: Widowed    Spouse name: Not on file   Number of children: 2   Years of education: Not on file   Highest education level: Associate degree: occupational, Scientist, product/process development, or vocational program  Occupational History   Occupation: [employer] farm manager  Tobacco Use   Smoking status: Never   Smokeless tobacco: Never  Vaping Use   Vaping status: Never Used  Substance and Sexual Activity   Alcohol use: No   Drug use:  No   Sexual activity: Yes    Birth control/protection: Surgical    Comment: Hyst  Other Topics Concern   Not on file  Social History Narrative   Not on file   Social Determinants of Health   Financial Resource Strain: Low Risk  (03/06/2023)   Overall Financial Resource Strain (CARDIA)    Difficulty of Paying Living Expenses: Not hard at all  Food Insecurity: No Food Insecurity (03/06/2023)   Hunger Vital Sign    Worried About Running Out of Food in the Last Year: Never true    Ran Out of Food in the Last Year: Never true  Transportation Needs: No Transportation Needs (03/06/2023)   PRAPARE - Administrator, Civil Service (Medical): No    Lack of Transportation (Non-Medical): No  Physical Activity: Insufficiently Active (03/06/2023)   Exercise Vital Sign    Days of Exercise per Week: 1 day    Minutes of Exercise per Session: 10 min  Stress: No Stress Concern Present (03/06/2023)   [employer] of Occupational Health - Occupational Stress Questionnaire    Feeling of Stress : Not at all  Social Connections: Moderately Isolated (03/06/2023)   Social Connection and Isolation Panel [NHANES]    Frequency of Communication with Friends and Family: More than three times a week    Frequency of Social Gatherings with Friends and Family: Twice a week    Attends Religious Services: More than 4 times per year    Active Member of [employer] or Organizations: No    Attends Banker Meetings: Not on file    Marital Status: Widowed  Intimate Partner Violence: Not on file    Physical Exam: Today's Vitals   03/21/23 1330  BP: 135/85  Pulse: 83  Resp: 12  Temp: 97.9 F (36.6 C)  TempSrc: Tympanic  SpO2: 98%  Weight: 91.3 kg  Height: 5\' 8"  (1.727 m)  PainSc: 0-No pain   Body mass index is 30.6 kg/m. GEN: NAD EYE: Sclerae anicteric ENT: MMM CV: Non-tachycardic GI: Soft, NT/ND NEURO:  Alert & Oriented x 3  Lab Results: Recent Labs    03/20/23 0853  WBC 5.6   HGB 13.3  HCT 35.8*  PLT 285   BMET Recent Labs    03/20/23 0853  NA 139  K 3.3*  CL 104  CO2 28  GLUCOSE 146*  BUN 13  CREATININE 0.65  CALCIUM 9.6   LFT Recent Labs    03/20/23 0853  PROT 7.2  ALBUMIN 4.3  AST 35  ALT 28  ALKPHOS 120  BILITOT 1.0   PT/INR No results for input(s): "LABPROT", "INR" in the last 72 hours.   Impression / Plan: This is a 57 y.o.female who presents for EGD for evaluation of pyrosis/GERD/Atypical chest pain with known history of metastatic diffuse gastric cancer.  The risks and benefits of endoscopic evaluation/treatment were discussed with the patient  and/or family; these include but are not limited to the risk of perforation, infection, bleeding, missed lesions, lack of diagnosis, severe illness requiring hospitalization, as well as anesthesia and sedation related illnesses.  The patient's history has been reviewed, patient examined, no change in status, and deemed stable for procedure.  The patient and/or family is agreeable to proceed.    Corliss Parish, MD [facility] Advanced [facility]oscopy Office # [phone]

## 2023-03-21 NOTE — Telephone Encounter (Signed)
Patient advised Voiced understanding 

## 2023-03-21 NOTE — Anesthesia Preprocedure Evaluation (Addendum)
Anesthesia Evaluation  Patient identified by MRN, date of birth, ID band Patient awake    Reviewed: Allergy & Precautions, NPO status , Patient's Chart, lab work & pertinent test results  Airway Mallampati: II  TM Distance: >3 FB Neck ROM: Full    Dental  (+) Dental Advisory Given, Teeth Intact   Pulmonary neg pulmonary ROS   Pulmonary exam normal breath sounds clear to auscultation       Cardiovascular hypertension, Pt. on medications Normal cardiovascular exam Rhythm:Regular Rate:Normal     Neuro/Psych negative neurological ROS  negative psych ROS   GI/Hepatic Neg liver ROS,GERD  Medicated and Poorly Controlled,,  Endo/Other  diabetes    Renal/GU negative Renal ROS     Musculoskeletal negative musculoskeletal ROS (+)    Abdominal  (+) + obese  Peds  Hematology negative hematology ROS (+)   Anesthesia Other Findings   Reproductive/Obstetrics                             Anesthesia Physical Anesthesia Plan  ASA: 3  Anesthesia Plan: MAC   Post-op Pain Management: Minimal or no pain anticipated   Induction: Intravenous  PONV Risk Score and Plan: 3 and Ondansetron, Treatment may vary due to age or medical condition, Propofol infusion and TIVA  Airway Management Planned: Mask  Additional Equipment: None  Intra-op Plan:   Post-operative Plan:   Informed Consent: I have reviewed the patients History and Physical, chart, labs and discussed the procedure including the risks, benefits and alternatives for the proposed anesthesia with the patient or authorized representative who has indicated his/her understanding and acceptance.     Dental advisory given  Plan Discussed with: CRNA  Anesthesia Plan Comments:        Anesthesia Quick Evaluation

## 2023-03-21 NOTE — Telephone Encounter (Addendum)
Patient has been scheduled for 03/21/23 @ [facility]. Patient has been instructed to arrive at 1:00 pm per [facility] -RN.  Called and spoke with patient , she has been NPO since midnight and will arrive with driver. Patient voiced understanding.

## 2023-03-21 NOTE — Telephone Encounter (Signed)
Insurance was checked by our precert department-   No auth needed per Maryruth Eve with [facility]-- call ref# [phone]

## 2023-03-21 NOTE — Telephone Encounter (Signed)
The Anesthesia team will see what may/may not be possible in regards to port use or a separate IV. I'll see her for procedure later today. Thanks. GM

## 2023-03-22 NOTE — Anesthesia Postprocedure Evaluation (Signed)
Anesthesia Post Note  [name]  Procedure(s) Performed: ESOPHAGOGASTRODUODENOSCOPY (EGD) WITH PROPOFOL BIOPSY     Patient location during evaluation: PACU Anesthesia Type: MAC Level of consciousness: awake and alert Pain management: pain level controlled Vital Signs Assessment: post-procedure vital signs reviewed and stable Respiratory status: spontaneous breathing Cardiovascular status: stable Anesthetic complications: no   No notable events documented.  Last Vitals:  Vitals:   03/21/23 1610 03/21/23 1620  BP: (!) 140/89 134/88  Pulse: 87 81  Resp: 16 15  Temp:    SpO2: 93% 95%    Last Pain:  Vitals:   03/21/23 1620  TempSrc:   PainSc: 0-No pain                 Lewie Loron

## 2023-03-24 ENCOUNTER — Encounter (HOSPITAL_COMMUNITY): Payer: Self-pay | Admitting: Gastroenterology

## 2023-03-25 ENCOUNTER — Other Ambulatory Visit: Payer: Self-pay

## 2023-03-25 ENCOUNTER — Encounter: Payer: Self-pay | Admitting: Gastroenterology

## 2023-03-25 LAB — SURGICAL PATHOLOGY

## 2023-03-25 NOTE — Telephone Encounter (Signed)
error 

## 2023-04-02 ENCOUNTER — Other Ambulatory Visit: Payer: Self-pay

## 2023-04-02 ENCOUNTER — Other Ambulatory Visit: Payer: Self-pay | Admitting: Hematology

## 2023-04-02 DIAGNOSIS — C162 Malignant neoplasm of body of stomach: Secondary | ICD-10-CM

## 2023-04-02 NOTE — Progress Notes (Signed)
Specialty Pharmacy Refill Coordination Note  Sheryl [name] is a 57 y.o. female contacted today regarding refills of specialty medication(s) Capecitabine   Patient requested Delivery   Delivery date: 04/11/23   Verified address: [address] [place] [address]   Medication will be filled on 04/10/23.

## 2023-04-03 ENCOUNTER — Other Ambulatory Visit: Payer: Self-pay | Admitting: Hematology

## 2023-04-03 ENCOUNTER — Other Ambulatory Visit: Payer: Self-pay

## 2023-04-03 DIAGNOSIS — C162 Malignant neoplasm of body of stomach: Secondary | ICD-10-CM

## 2023-04-03 MED ORDER — CAPECITABINE 500 MG PO TABS
ORAL_TABLET | ORAL | 0 refills | Status: DC
  Filled 2023-04-04: qty 70, 21d supply, fill #0

## 2023-04-04 ENCOUNTER — Other Ambulatory Visit (HOSPITAL_COMMUNITY): Payer: Self-pay

## 2023-04-04 ENCOUNTER — Other Ambulatory Visit: Payer: Self-pay

## 2023-04-08 ENCOUNTER — Other Ambulatory Visit: Payer: Self-pay | Admitting: Internal Medicine

## 2023-04-08 DIAGNOSIS — Z1231 Encounter for screening mammogram for malignant neoplasm of breast: Secondary | ICD-10-CM

## 2023-04-09 NOTE — Assessment & Plan Note (Signed)
[id number] with peritoneal metastasis. 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   -she started first line chemo FLOT on 1/10 -She understands that chemotherapy is palliative, to prolong her life.  We are unlikely going to cure her cancer. -PD-L1 0-1%, no significant benefit from PD-L1 immunotherapy, FO revealed FGFR2 amplification and fusion (+), FGFR inhibitors can be considered in future, no other targeted therapy available  -She has been tolerating chemo very well, will continue for now  -PET scan from 05/30/2022 was negative for primary tumor or metastatic disease, the known peritoneal mets did not show on PET.  -she has been tolerating chemo well overall. Due to fatigue, I have changed her chemo from FLOT to FOLFOX on 06/20/2022, she tolerated well -she previously asked the role of surgery, depends on her next restaging CT scan findings, I may refer her to [facility] or [facility] to discuss HIPEC surgery  -due to her infusion reaction to oxaliplatin on C5, we added additional premeds and gave slow infusion over 4 hours for cycle 6 and she tolerated well  -She is not able to return to work due to the cancer and treatment related symptoms.  -She is tolerating FOLFOX well overall, with moderate fatigue for a few days after infusion but able to recover well.  No signs of neuropathy at this point. -Restaging CT abdomen pelvis from August 27, 2022 showed no residual disease.  -We again discussed maintenance therapy with Xeloda down the road, we will stop oxaliplatin when she develops side effects especially neuropathy, or after next scan -repeated staging CT from 11/26/2021 showed stable disease  -I have changed her treatment to maintenance Xeloda in early August 2024, she is tolerating well overall  -her NGS Caris showed positive Claudin 18.2, she is a  candidate for zolbetuximab.  -Repeated EGD on March 21, 2023 showed residual gastric cancer.  We discussed option of changing her chemotherapy back to FOLFOX next month and add zolbetuximab when it's available

## 2023-04-10 ENCOUNTER — Encounter: Payer: Self-pay | Admitting: Hematology

## 2023-04-10 ENCOUNTER — Inpatient Hospital Stay: Payer: Medicaid Other | Attending: Physician Assistant

## 2023-04-10 ENCOUNTER — Other Ambulatory Visit: Payer: Self-pay

## 2023-04-10 ENCOUNTER — Inpatient Hospital Stay (HOSPITAL_BASED_OUTPATIENT_CLINIC_OR_DEPARTMENT_OTHER): Payer: Medicaid Other | Admitting: Hematology

## 2023-04-10 VITALS — BP 138/87 | HR 74 | Temp 98.2°F | Resp 16 | Wt 200.6 lb

## 2023-04-10 DIAGNOSIS — Z800 Family history of malignant neoplasm of digestive organs: Secondary | ICD-10-CM | POA: Diagnosis not present

## 2023-04-10 DIAGNOSIS — C169 Malignant neoplasm of stomach, unspecified: Secondary | ICD-10-CM | POA: Diagnosis present

## 2023-04-10 DIAGNOSIS — Z95828 Presence of other vascular implants and grafts: Secondary | ICD-10-CM

## 2023-04-10 DIAGNOSIS — C162 Malignant neoplasm of body of stomach: Secondary | ICD-10-CM | POA: Diagnosis not present

## 2023-04-10 DIAGNOSIS — Z803 Family history of malignant neoplasm of breast: Secondary | ICD-10-CM | POA: Insufficient documentation

## 2023-04-10 DIAGNOSIS — Z79899 Other long term (current) drug therapy: Secondary | ICD-10-CM | POA: Insufficient documentation

## 2023-04-10 DIAGNOSIS — R109 Unspecified abdominal pain: Secondary | ICD-10-CM | POA: Insufficient documentation

## 2023-04-10 DIAGNOSIS — C786 Secondary malignant neoplasm of retroperitoneum and peritoneum: Secondary | ICD-10-CM | POA: Insufficient documentation

## 2023-04-10 LAB — CMP (CANCER CENTER ONLY)
ALT: 33 U/L (ref 0–44)
AST: 40 U/L (ref 15–41)
Albumin: 4.4 g/dL (ref 3.5–5.0)
Alkaline Phosphatase: 121 U/L (ref 38–126)
Anion gap: 9 (ref 5–15)
BUN: 10 mg/dL (ref 6–20)
CO2: 26 mmol/L (ref 22–32)
Calcium: 9.1 mg/dL (ref 8.9–10.3)
Chloride: 102 mmol/L (ref 98–111)
Creatinine: 0.63 mg/dL (ref 0.44–1.00)
GFR, Estimated: >60 mL/min (ref >60)
Glucose, Bld: 124 mg/dL — ABNORMAL HIGH (ref 70–99)
Potassium: 3.2 mmol/L — ABNORMAL LOW (ref 3.5–5.1)
Sodium: 137 mmol/L (ref 135–145)
Total Bilirubin: 1.1 mg/dL (ref <1.2)
Total Protein: 7.5 g/dL (ref 6.5–8.1)

## 2023-04-10 LAB — CBC WITH DIFFERENTIAL (CANCER CENTER ONLY)
Abs Immature Granulocytes: 0.01 10*3/uL (ref 0.00–0.07)
Basophils Absolute: 0 10*3/uL (ref 0.0–0.1)
Basophils Relative: 1 %
Eosinophils Absolute: 0.2 10*3/uL (ref 0.0–0.5)
Eosinophils Relative: 5 %
HCT: 35.4 % — ABNORMAL LOW (ref 36.0–46.0)
Hemoglobin: 12.7 g/dL (ref 12.0–15.0)
Immature Granulocytes: 0 %
Lymphocytes Relative: 27 %
Lymphs Abs: 0.8 10*3/uL (ref 0.7–4.0)
MCH: 35.1 pg — ABNORMAL HIGH (ref 26.0–34.0)
MCHC: 35.9 g/dL (ref 30.0–36.0)
MCV: 97.8 fL (ref 80.0–100.0)
Monocytes Absolute: 0.4 10*3/uL (ref 0.1–1.0)
Monocytes Relative: 14 %
Neutro Abs: 1.6 10*3/uL — ABNORMAL LOW (ref 1.7–7.7)
Neutrophils Relative %: 53 %
Platelet Count: 239 10*3/uL (ref 150–400)
RBC: 3.62 MIL/uL — ABNORMAL LOW (ref 3.87–5.11)
RDW: 16.5 % — ABNORMAL HIGH (ref 11.5–15.5)
WBC Count: 3.1 10*3/uL — ABNORMAL LOW (ref 4.0–10.5)
nRBC: 0 % (ref 0.0–0.2)

## 2023-04-10 MED ORDER — HEPARIN SOD (PORK) LOCK FLUSH 100 UNIT/ML IV SOLN
500.0000 [IU] | Freq: Once | INTRAVENOUS | Status: AC
  Administered 2023-04-10: 500 [IU]

## 2023-04-10 MED ORDER — SODIUM CHLORIDE 0.9% FLUSH
10.0000 mL | Freq: Once | INTRAVENOUS | Status: AC
  Administered 2023-04-10: 10 mL

## 2023-04-10 NOTE — Progress Notes (Signed)
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   -she started first line chemo FLOT on 1/10 -She understands that chemotherapy is palliative, to prolong her life.  We are unlikely going to cure her cancer. -PD-L1 0-1%, no significant benefit from PD-L1 immunotherapy, FO revealed FGFR2 amplification and fusion (+), FGFR inhibitors can be considered in future, no other targeted therapy available  -She has been tolerating chemo very well, will continue for now  -PET scan from 05/30/2022 was negative for primary tumor or metastatic disease, the known peritoneal mets did not show on PET.  -she has been tolerating chemo well overall. Due to fatigue, I have changed her chemo from FLOT to FOLFOX on 06/20/2022, she tolerated well -she previously asked the role of surgery, depends on her next restaging CT scan findings, I may refer her to [facility] or [facility] to discuss HIPEC surgery  -due to her infusion reaction to oxaliplatin on C5, we added additional premeds and gave slow infusion over 4 hours for cycle 6 and she tolerated well  -She is not able to return to work due to the cancer and treatment related symptoms.  -She is tolerating FOLFOX well overall, with moderate fatigue for a few days after infusion but able to recover well.  No signs of neuropathy at this point. -Restaging CT abdomen pelvis from August 27, 2022 showed no  residual disease.  -We again discussed maintenance therapy with Xeloda down the road, we will stop oxaliplatin when she develops side effects especially neuropathy, or after next scan -repeated staging CT from 11/26/2021 showed stable disease  -I have changed her treatment to maintenance Xeloda in early August 2024, she is tolerating well overall  -her NGS Caris showed positive Claudin 18.2, she is a candidate for zolbetuximab.  -Repeated EGD on March 21, 2023 showed residual gastric cancer.  We discussed option of changing her chemotherapy back to FOLFOX next month and add zolbetuximab when it's available    Assessment and Plan    Metastatic Gastric Cancer 57 year old with metastatic gastric cancer on Xeloda. Recent endoscopy by Dr. [name] showed persistent cancer cells but overall improvement. Reports reduced pain since starting alkaline water, advised to avoid it around chemotherapy dosing times. Concerns about current chemotherapy's effectiveness due to persistent pain. Discussed transitioning to a new intravenous chemotherapy regimen in January, including 5-FU pump, oxaliplatin, and a new FDA-approved biological agent zolbetuximab. Risks include initial nausea and vomiting, which typically improve over time. Patient willing to consider intravenous chemotherapy after the holiday. New regimen is less aggressive than previous three-drug combination and does not require Benadryl unless a reaction occurs. Provided written information on new chemotherapy options. - Continue Xeloda. - Avoid alkaline water one hour before and two hours after chemotherapy pills. -due to her recent gastric pain which is likely related to her cancer, I will consider transitioning to intravenous chemotherapy regimen FOLFOX in January, including 5-FU pump, oxaliplatin, and new biological agent. - Provide written information on new chemotherapy options. -  Schedule follow-up in 3-4 weeks after the holiday.  Pain  Management Reports reduced pain since starting alkaline water. - Continue current pain management strategies.  Plan -She is clinically doing well, will continue Xeloda at same dose -Follow-up in 4 weeks -Will consider changing treatment to FOLFOX and zolbetuximab in late Jan      SUMMARY OF ONCOLOGIC HISTORY: Oncology History Overview Note   Cancer Staging  Gastric cancer [facility]) Staging form: Stomach, AJCC 8th Edition - Clinical stage from 04/19/2022: Stage IVB (cT2, cN0, pM1) - Signed by [name], MD on 05/08/2022 Total positive nodes: 0     Gastric cancer (HCC)  03/30/2022 Procedure   EGD:  Impression:  - Normal esophagus. - A few gastric polyps. Biopsied. - Gastritis. Biopsied. - Non-bleeding gastric ulcer with no stigmata of bleeding. Biopsied. - Normal examined duodenum. Biopsied.  Findings: Diffuse moderate inflammation characterized by congestion (edema), friability and granularity was found in the cardia, in the gastric fundus and in the gastric body. There were associated erosions in multiple places. Biopsies were taken from the antrum, body, and fundus with a cold forceps for histology. Estimated blood loss was minimal.  One non-bleeding cratered gastric ulcer with no stigmata of bleeding was found on the greater curvature of the stomach. The lesion was 6 mm in largest dimension. The mucosa around the ulcer was heaped and led to some deformity in the antrum. Biopsies were taken with a cold forceps for histology. Estimated blood loss was minimal.    03/30/2022 Pathology Results   [name], Sheryl [name]  Accession: [id number]  Diagnosis 1. Surgical [P], duodenal - BENIGN SMALL BOWEL MUCOSA WITH NO SIGNIFICANT PATHOLOGIC CHANGES 2. Surgical [P], gastric antrum - GASTRIC ANTRAL MUCOSA WITH FEATURES OF REACTIVE GASTROPATHY - NEGATIVE FOR H. PYLORI ON H&E STAIN - NEGATIVE FOR INTESTINAL METAPLASIA OR MALIGNANCY 3. Surgical [P], gastric body - GASTRIC  OXYNTIC MUCOSA WITH REACTIVE/REPARATIVE CHANGES - NEGATIVE FOR H. PYLORI ON H&E STAIN - NEGATIVE FOR INTESTINAL METAPLASIA, DYSPLASIA OR MALIGNANCY 4. Surgical [P], greater curve ulceration - ADENOCARCINOMA WITH SIGNET RING CELL FEATURES (SEE NOTE) 5. Surgical [P], gastric polyps - ADENOCARCINOMA WITH SIGNET RING CELL FEATURES (SEE NOTE) 6. Surgical [P], fundus (gastric) - ADENOCARCINOMA WITH SIGNET RING CELL FEATURES (SEE NOTE) 7. Surgical [P], colon, ascending, polyp (1) - TUBULAR ADENOMA. - NO HIGH GRADE DYSPLASIA OR MALIGNANCY. 8. Surgical [P], colon, transverse, polyp (1) - TUBULAR ADENOMA. - NO HIGH GRADE DYSPLASIA OR MALIGNANCY.    04/13/2022 Initial Diagnosis   Gastric cancer (HCC)   04/19/2022 Cancer Staging   Staging form: Stomach, AJCC 8th Edition - Clinical stage from 04/19/2022: Stage IVB (cT2, cN0, pM1) - Signed by [name], MD on 05/08/2022 Total positive nodes: 0   05/05/2022 Genetic Testing   Negative genetic testing on the Multi-cancer gene panel + RNA.  FH c.259C>T VUS identified.  The report date is May 05, 2022.  The Multi-Cancer + RNA Panel offered by [facility] includes sequencing and/or deletion/duplication analysis of the following 70 genes:  AIP*, ALK, APC*, ATM*, AXIN2*, BAP1*, BARD1*, BLM*, BMPR1A*, BRCA1*, BRCA2*, BRIP1*, CDC73*, CDH1*, CDK4, CDKN1B*, CDKN2A, CHEK2*, CTNNA1*, DICER1*, EPCAM (del/dup only), EGFR, FH*, FLCN*, GREM1 (promoter dup only), HOXB13, KIT, LZTR1, MAX*, MBD4, MEN1*, MET, MITF, MLH1*, MSH2*, MSH3*, MSH6*, MUTYH*, NF1*, NF2*, NTHL1*, PALB2*, PDGFRA, PMS2*, POLD1*, POLE*, POT1*, PRKAR1A*, PTCH1*, PTEN*, RAD51C*, RAD51D*, RB1*, RET, SDHA* (sequencing only), SDHAF2*, SDHB*, SDHC*, SDHD*, SMAD4*, SMARCA4*, SMARCB1*, SMARCE1*, STK11*, SUFU*, TMEM127*, TP53*, TSC1*, TSC2*, VHL*. RNA analysis is performed for *  genes.    05/09/2022 - 06/07/2022 Chemotherapy   Patient is on Treatment Plan : GASTROESOPHAGEAL FLOT q14d X 4 cycles      Miscellaneous    Foundation One  Biomarker Findings Microsatellite status- Cannot be determined Tumor Mutational Burden- Cannot be determined  Genomic Findings  FGFR2 amplification,FGFR2-TACC2 fusion,  Rearrangement intron 17 ARAF amplification CCND3 amplification TP53 [id number]*74     05/30/2022 Imaging    IMPRESSION: 1. Mild hypermetabolism corresponding to a dominant left upper quadrant mass and smaller perigastric nodules or nodes. 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 Recommend attention to the dominant left upper quadrant soft tissue mass on follow-up exams to exclude unlikely recurrent lymphoma. 2. No gastric hypermetabolism and no typical findings of metastatic disease.   06/20/2022 -  Chemotherapy   Patient is on Treatment Plan : GASTRIC FOLFOX q14d x 12 cycles     08/27/2022 Imaging    IMPRESSION: No focal gastric mass on CT.   No findings suspicious for recurrent or metastatic disease.   Stable left upper abdominal soft tissue lesion and small lymph nodes, chronic, favoring treated lymphoma.   11/27/2022 Imaging    IMPRESSION: 1. Questionable thickening of the distal esophagus/GE junction and gastric antrum, consider further evaluation with endoscopy. 2. Chronically stable left upper quadrant nodularity and prominent lymph nodes again favored treated lymphoma. Continued attention on follow-up imaging suggested. 3. No convincing evidence of metastatic disease in the chest, abdomen or pelvis. 4. Questionable asymmetric wall thickening of the rectum, consider further evaluation with colonoscopy. 5. Mild wall thickening of a nondistended urinary bladder, correlate with urinalysis to exclude cystitis. 6. Hepatic steatosis.      Discussed the use of AI scribe software for clinical note transcription with the patient, who gave verbal consent to proceed.  History of Present Illness   The patient is a 58 year old woman with a history  of metastatic gastric cancer. She reports a decrease in pain since she started drinking alkaline water. She has been taking a chemotherapy pill, which she reports tolerating well. She recently underwent a biopsy, which showed the presence of cancer cells but overall improvement compared to previous results. The patient's pain has been a concern, leading to the consideration of a change in her treatment regimen.         All other systems were reviewed with the patient and are negative.  MEDICAL HISTORY:  Past Medical History:  Diagnosis Date   Blood transfusion without reported diagnosis    had transfusion with hysterectomy   Cataract    Colon polyps 2012   Diabetes (HCC) 03/13/2021   Diabetes (HCC) 05/21/2019   Family history of breast cancer    Family history of pancreatic cancer    Family history of stomach cancer    Fibroid    gastric ca 03/2022   GERD (gastroesophageal reflux disease)    H/O blood clots    History of hysterectomy    fibroids and heavy cycles   Hypertension     SURGICAL HISTORY: Past Surgical History:  Procedure Laterality Date   ABDOMINAL HYSTERECTOMY     BIOPSY  04/19/2022   Procedure: BIOPSY;  Surgeon: Lemar Lofty., MD;  Location: [facility];  Service: Gastroenterology;;   BIOPSY  03/21/2023   Procedure: BIOPSY;  Surgeon: Lemar Lofty., MD;  Location: [facility];  Service: Gastroenterology;;   COLONOSCOPY     ESOPHAGOGASTRODUODENOSCOPY (EGD) WITH PROPOFOL N/A 04/19/2022   Procedure: ESOPHAGOGASTRODUODENOSCOPY (EGD) WITH PROPOFOL;  Surgeon: Lemar Lofty., MD;  Location: [facility];  Service: Gastroenterology;  Laterality: N/A;   ESOPHAGOGASTRODUODENOSCOPY (EGD) WITH PROPOFOL N/A 03/21/2023   Procedure: ESOPHAGOGASTRODUODENOSCOPY (EGD) WITH PROPOFOL;  Surgeon: Meridee Score Netty Starring., MD;  Location: [facility];  Service: Gastroenterology;  Laterality: N/A;   EUS N/A 04/19/2022   Procedure: UPPER ENDOSCOPIC  ULTRASOUND (EUS) RADIAL;  Surgeon: Lemar Lofty., MD;  Location: [facility];  Service: Gastroenterology;  Laterality: N/A;   EXCISION OF SKIN TAG  05/03/2022   Procedure: EXCISION OF CHEST WALL SKIN LESION;  Surgeon: Fritzi Mandes, MD;  Location: [facility];  Service: General;;   LAPAROSCOPY N/A 05/03/2022   Procedure: LAPAROSCOPY DIAGNOSTIC WITH PERITONEAL WASHINGS;  Surgeon: Fritzi Mandes, MD;  Location: [facility];  Service: General;  Laterality: N/A;   POLYPECTOMY  04/19/2022   Procedure: POLYPECTOMY;  Surgeon: Lemar Lofty., MD;  Location: [facility];  Service: Gastroenterology;;   PORTACATH PLACEMENT N/A 05/03/2022   Procedure: INSERTION PORT-A-CATH WITH ULTRASOUND GUIDANCE;  Surgeon: Fritzi Mandes, MD;  Location: [facility];  Service: General;  Laterality: N/A;   UPPER GASTROINTESTINAL ENDOSCOPY      I have reviewed the social history and family history with the patient and they are unchanged from previous note.  ALLERGIES:  is allergic to aspirin, cyclobenzaprine, naproxen sodium, zithromax [azithromycin dihydrate], oxaliplatin, and dilaudid [hydromorphone].  MEDICATIONS:  Current Outpatient Medications  Medication Sig Dispense Refill   acetaminophen (TYLENOL) 500 MG tablet Take 2 tablets (1,000 mg total) by mouth every 8 (eight) hours as needed (pain). 30 tablet 1   amLODipine (NORVASC) 10 MG tablet Take 1 tablet (10 mg total) by mouth daily. 90 tablet 0   b complex vitamins capsule Take 1 capsule by mouth daily.     capecitabine (XELODA) 500 MG tablet Take 2 tabs in morning and 3 tabs in evening. Take within 30 minutes after meals. Take for 14 days on, then off for 7 days. Repeat every 21 days. 70 tablet 0   Cyanocobalamin (VITAMIN B 12 PO) Take by mouth.     famotidine (PEPCID) 20 MG tablet Take 1 tablet (20 mg total) by mouth 2 (two) times daily. 60 tablet 2   HYDROcodone-acetaminophen (NORCO/VICODIN) 5-325 MG tablet Take 1 tablet by mouth every 6 (six) hours as needed  for moderate pain. 10 tablet 0   lidocaine-prilocaine (EMLA) cream Apply 1 Application topically as needed. 30 g 1   POTASSIUM PO Take 99 mg by mouth daily.     sucralfate (CARAFATE) 1 g tablet Take 1 tablet (1 g total) by mouth 2 (two) times daily. 60 tablet 6   No current facility-administered medications for this visit.    PHYSICAL EXAMINATION: ECOG PERFORMANCE STATUS: 1 - Symptomatic but completely ambulatory  Vitals:   04/10/23 0929  BP: 138/87  Pulse: 74  Resp: 16  Temp: 98.2 F (36.8 C)  SpO2: 99%   Wt Readings from Last 3 Encounters:  04/10/23 200 lb 9.6 oz (91 kg)  03/21/23 201 lb 4.5 oz (91.3 kg)  03/20/23 201 lb 3.2 oz (91.3 kg)     GENERAL:alert, no distress and comfortable SKIN: skin color, texture, turgor are normal, no rashes or significant lesions EYES: normal, Conjunctiva are pink and non-injected, sclera clear NECK: supple, thyroid normal size, non-tender, without nodularity LYMPH:  no palpable lymphadenopathy in the cervical, axillary  LUNGS: clear to auscultation and percussion with normal breathing effort HEART: regular rate & rhythm and no murmurs and no lower extremity edema ABDOMEN:abdomen  soft, non-tender and normal bowel sounds Musculoskeletal:no cyanosis of digits and no clubbing  NEURO: alert & oriented x 3 with fluent speech, no focal motor/sensory deficits       LABORATORY DATA:  I have reviewed the data as listed    Latest Ref Rng & Units 04/10/2023    8:48 AM 03/20/2023    8:53 AM 02/26/2023   10:33 AM  CBC  WBC 4.0 - 10.5 K/uL 3.1  5.6  3.0   Hemoglobin 12.0 - 15.0 g/dL 16.1  09.6  04.5   Hematocrit 36.0 - 46.0 % 35.4  35.8  37.4   Platelets 150 - 400 K/uL 239  285  233         Latest Ref Rng & Units 04/10/2023    8:48 AM 03/20/2023    8:53 AM 03/12/2023    2:07 PM  CMP  Glucose 70 - 99 mg/dL 409  811  99   BUN 6 - 20 mg/dL 10  13  9    Creatinine 0.44 - 1.00 mg/dL 9.14  7.82  9.56   Sodium 135 - 145 mmol/L 137  139  139    Potassium 3.5 - 5.1 mmol/L 3.2  3.3  3.7   Chloride 98 - 111 mmol/L 102  104  103   CO2 22 - 32 mmol/L 26  28  27    Calcium 8.9 - 10.3 mg/dL 9.1  9.6  9.6   Total Protein 6.5 - 8.1 g/dL 7.5  7.2    Total Bilirubin <1.2 mg/dL 1.1  1.0    Alkaline Phos 38 - 126 U/L 121  120    AST 15 - 41 U/L 40  35    ALT 0 - 44 U/L 33  28        RADIOGRAPHIC STUDIES: I have personally reviewed the radiological images as listed and agreed with the findings in the report. No results found.    No orders of the defined types were placed in this encounter.  All questions were answered. The patient knows to call the clinic with any problems, questions or concerns. No barriers to learning was detected. The total time spent in the appointment was 25 minutes.     [name], MD 04/10/2023

## 2023-04-15 ENCOUNTER — Other Ambulatory Visit: Payer: Self-pay

## 2023-04-25 ENCOUNTER — Other Ambulatory Visit: Payer: Self-pay

## 2023-04-25 ENCOUNTER — Other Ambulatory Visit: Payer: Self-pay | Admitting: Nurse Practitioner

## 2023-04-25 DIAGNOSIS — I10 Essential (primary) hypertension: Secondary | ICD-10-CM

## 2023-04-25 MED ORDER — AMLODIPINE BESYLATE 10 MG PO TABS
10.0000 mg | ORAL_TABLET | Freq: Every day | ORAL | 0 refills | Status: DC
  Filled 2023-04-25: qty 30, 30d supply, fill #0

## 2023-04-26 ENCOUNTER — Other Ambulatory Visit: Payer: Self-pay

## 2023-04-30 ENCOUNTER — Other Ambulatory Visit: Payer: Self-pay

## 2023-04-30 ENCOUNTER — Other Ambulatory Visit (HOSPITAL_COMMUNITY): Payer: Self-pay

## 2023-04-30 ENCOUNTER — Other Ambulatory Visit: Payer: Self-pay | Admitting: Hematology

## 2023-04-30 DIAGNOSIS — C162 Malignant neoplasm of body of stomach: Secondary | ICD-10-CM

## 2023-04-30 MED ORDER — CAPECITABINE 500 MG PO TABS
ORAL_TABLET | ORAL | 0 refills | Status: DC
  Filled 2023-04-30: qty 70, 21d supply, fill #0

## 2023-04-30 NOTE — Progress Notes (Signed)
 Specialty Pharmacy Refill Coordination Note  Sheryl [name] is a 57 y.o. female contacted today regarding refills of specialty medication(s) Capecitabine  (XELODA )   Patient requested Delivery   Delivery date: 05/03/23   Verified address: [address]   [place] [address]   Medication will be filled on 05/02/23 pending a refill request.

## 2023-05-02 ENCOUNTER — Other Ambulatory Visit: Payer: Self-pay

## 2023-05-03 ENCOUNTER — Other Ambulatory Visit: Payer: Self-pay

## 2023-05-03 ENCOUNTER — Ambulatory Visit
Admission: RE | Admit: 2023-05-03 | Discharge: 2023-05-03 | Disposition: A | Discharge status: Home / Self Care | Payer: Medicaid Other | Source: Ambulatory Visit | Attending: Internal Medicine | Admitting: Internal Medicine

## 2023-05-03 DIAGNOSIS — Z1231 Encounter for screening mammogram for malignant neoplasm of breast: Secondary | ICD-10-CM

## 2023-05-06 NOTE — Assessment & Plan Note (Signed)
 [id number] with peritoneal metastasis. 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   -she started first line chemo FLOT on 1/10 -She understands that chemotherapy is palliative, to prolong her life.  We are unlikely going to cure her cancer. -PD-L1 0-1%, no significant benefit from PD-L1 immunotherapy, FO revealed FGFR2 amplification and fusion (+), FGFR inhibitors can be considered in future, no other targeted therapy available  -She has been tolerating chemo very well, will continue for now  -PET scan from 05/30/2022 was negative for primary tumor or metastatic disease, the known peritoneal mets did not show on PET.  -she has been tolerating chemo well overall. Due to fatigue, I have changed her chemo from FLOT to FOLFOX on 06/20/2022, she tolerated well -she previously asked the role of surgery, depends on her next restaging CT scan findings, I may refer her to [facility] or [facility] to discuss HIPEC surgery  -due to her infusion reaction to oxaliplatin on C5, we added additional premeds and gave slow infusion over 4 hours for cycle 6 and she tolerated well  -She is not able to return to work due to the cancer and treatment related symptoms.  -She is tolerating FOLFOX well overall, with moderate fatigue for a few days after infusion but able to recover well.  No signs of neuropathy at this point. -Restaging CT abdomen pelvis from August 27, 2022 showed no residual disease.  -We again discussed maintenance therapy with Xeloda down the road, we will stop oxaliplatin when she develops side effects especially neuropathy, or after next scan -repeated staging CT from 11/26/2021 showed stable disease  -I have changed her treatment to maintenance Xeloda in early August 2024, she is tolerating well overall  -her NGS Caris showed positive Claudin 18.2, she is a  candidate for zolbetuximab.  -Repeated EGD on March 21, 2023 showed residual gastric cancer.  We discussed option of changing her chemotherapy back to FOLFOX next month and add zolbetuximab when it's available

## 2023-05-08 ENCOUNTER — Other Ambulatory Visit: Payer: Self-pay

## 2023-05-08 ENCOUNTER — Encounter: Payer: Self-pay | Admitting: Hematology

## 2023-05-08 ENCOUNTER — Other Ambulatory Visit (HOSPITAL_COMMUNITY): Payer: Self-pay

## 2023-05-08 ENCOUNTER — Inpatient Hospital Stay: Payer: Medicaid Other | Attending: Physician Assistant

## 2023-05-08 ENCOUNTER — Inpatient Hospital Stay (HOSPITAL_BASED_OUTPATIENT_CLINIC_OR_DEPARTMENT_OTHER): Payer: Medicaid Other | Admitting: Hematology

## 2023-05-08 VITALS — BP 136/90 | HR 73 | Temp 97.3°F | Resp 17 | Wt 204.9 lb

## 2023-05-08 DIAGNOSIS — C786 Secondary malignant neoplasm of retroperitoneum and peritoneum: Secondary | ICD-10-CM | POA: Insufficient documentation

## 2023-05-08 DIAGNOSIS — Z95828 Presence of other vascular implants and grafts: Secondary | ICD-10-CM

## 2023-05-08 DIAGNOSIS — C169 Malignant neoplasm of stomach, unspecified: Secondary | ICD-10-CM | POA: Diagnosis present

## 2023-05-08 DIAGNOSIS — C162 Malignant neoplasm of body of stomach: Secondary | ICD-10-CM | POA: Diagnosis not present

## 2023-05-08 DIAGNOSIS — D72819 Decreased white blood cell count, unspecified: Secondary | ICD-10-CM | POA: Insufficient documentation

## 2023-05-08 LAB — CBC WITH DIFFERENTIAL (CANCER CENTER ONLY)
Abs Immature Granulocytes: 0 10*3/uL (ref 0.00–0.07)
Basophils Absolute: 0 10*3/uL (ref 0.0–0.1)
Basophils Relative: 0 %
Eosinophils Absolute: 0.1 10*3/uL (ref 0.0–0.5)
Eosinophils Relative: 3 %
HCT: 37.7 % (ref 36.0–46.0)
Hemoglobin: 13.6 g/dL (ref 12.0–15.0)
Immature Granulocytes: 0 %
Lymphocytes Relative: 21 %
Lymphs Abs: 0.9 10*3/uL (ref 0.7–4.0)
MCH: 34.9 pg — ABNORMAL HIGH (ref 26.0–34.0)
MCHC: 36.1 g/dL — ABNORMAL HIGH (ref 30.0–36.0)
MCV: 96.7 fL (ref 80.0–100.0)
Monocytes Absolute: 0.5 10*3/uL (ref 0.1–1.0)
Monocytes Relative: 11 %
Neutro Abs: 2.7 10*3/uL (ref 1.7–7.7)
Neutrophils Relative %: 65 %
Platelet Count: 257 10*3/uL (ref 150–400)
RBC: 3.9 MIL/uL (ref 3.87–5.11)
RDW: 15.9 % — ABNORMAL HIGH (ref 11.5–15.5)
WBC Count: 4.2 10*3/uL (ref 4.0–10.5)
nRBC: 0 % (ref 0.0–0.2)

## 2023-05-08 LAB — CMP (CANCER CENTER ONLY)
ALT: 31 U/L (ref 0–44)
AST: 39 U/L (ref 15–41)
Albumin: 4.3 g/dL (ref 3.5–5.0)
Alkaline Phosphatase: 135 U/L — ABNORMAL HIGH (ref 38–126)
Anion gap: 7 (ref 5–15)
BUN: 12 mg/dL (ref 6–20)
CO2: 28 mmol/L (ref 22–32)
Calcium: 9.3 mg/dL (ref 8.9–10.3)
Chloride: 105 mmol/L (ref 98–111)
Creatinine: 0.65 mg/dL (ref 0.44–1.00)
GFR, Estimated: >60 mL/min (ref >60)
Glucose, Bld: 119 mg/dL — ABNORMAL HIGH (ref 70–99)
Potassium: 3.5 mmol/L (ref 3.5–5.1)
Sodium: 140 mmol/L (ref 135–145)
Total Bilirubin: 1.1 mg/dL (ref 0.0–1.2)
Total Protein: 7.1 g/dL (ref 6.5–8.1)

## 2023-05-08 MED ORDER — SODIUM CHLORIDE 0.9% FLUSH
10.0000 mL | Freq: Once | INTRAVENOUS | Status: AC
  Administered 2023-05-08: 10 mL

## 2023-05-08 MED ORDER — CAPECITABINE 500 MG PO TABS
ORAL_TABLET | ORAL | 0 refills | Status: DC
  Filled 2023-05-08: qty 12, 12d supply, fill #0
  Filled 2023-05-21: qty 84, 21d supply, fill #1

## 2023-05-08 MED ORDER — HEPARIN SOD (PORK) LOCK FLUSH 100 UNIT/ML IV SOLN
500.0000 [IU] | Freq: Once | INTRAVENOUS | Status: AC
  Administered 2023-05-08: 500 [IU]

## 2023-05-08 NOTE — Progress Notes (Signed)
 [facility]   Telephone:([phone] Fax:([phone]   Clinic Follow up Note   Patient Care Team: [name], MD as PCP - General (Internal Medicine) [name], MD as Consulting Physician (Oncology)  Date of Service:  05/08/2023  CHIEF COMPLAINT: f/u of gastric cancer  CURRENT THERAPY:  Maintenance Xeloda   Oncology History   Gastric cancer (HCC) cT2N0M1 with peritoneal metastasis. 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   -she started first line chemo FLOT on 1/10 -She understands that chemotherapy is palliative, to prolong her life.  We are unlikely going to cure her cancer. -PD-L1 0-1%, no significant benefit from PD-L1 immunotherapy, FO revealed FGFR2 amplification and fusion (+), FGFR inhibitors can be considered in future, no other targeted therapy available  -She has been tolerating chemo very well, will continue for now  -PET scan from 05/30/2022 was negative for primary tumor or metastatic disease, the known peritoneal mets did not show on PET.  -she has been tolerating chemo well overall. Due to fatigue, I have changed her chemo from FLOT to FOLFOX on 06/20/2022, she tolerated well -she previously asked the role of surgery, depends on her next restaging CT scan findings, I may refer her to [facility] or [facility] to discuss HIPEC surgery  -due to her infusion reaction to oxaliplatin  on C5, we added additional premeds and gave slow infusion over 4 hours for cycle 6 and she tolerated well  -She is not able to return to work due to the cancer and treatment related symptoms.  -She is tolerating FOLFOX well overall, with moderate fatigue for a few days after infusion but able to recover well.  No signs of neuropathy at this point. -Restaging CT abdomen pelvis from August 27, 2022 showed no residual disease.   -We again discussed maintenance therapy with Xeloda  down the road, we will stop oxaliplatin  when she develops side effects especially neuropathy, or after next scan -repeated staging CT from 11/26/2021 showed stable disease  -I have changed her treatment to maintenance Xeloda  in early August 2024, she is tolerating well overall  -her NGS Caris showed positive Claudin 18.2, she is a candidate for zolbetuximab.  -Repeated EGD on March 21, 2023 showed residual gastric cancer.  We discussed option of changing her chemotherapy back to FOLFOX next month and add zolbetuximab when it's available     Assessment and Plan    Gastric Cancer Follow-up for gastric cancer. Currently on Xeloda  (capecitabine ) with good tolerance. Previous intravenous chemotherapy showed good response. Recent endoscopy showed improvement but cancer persists. Pain initially associated with chemotherapy pills and acid reflux, now managed with alkaline water. Concerns about alkaline water affecting Xeloda  absorption. Discussed potential new medication Darice) for prolonging life and improving cancer control. Risks include nausea, vomiting, and other side effects, especially in the first few treatments. Study showed Xobi improved cancer control and slightly improved life expectancy by a few months compared to chemotherapy alone. Patient prefers to continue current regimen unless disease progresses. Discussed alternative regimens like Forferi with 5FU pump if Xeloda  fails. - Continue Xeloda  at increased dose of 3 tablets in the morning and 3 tablets in the evening - Order PET CT scan in February, pending insurance approval - Refill Xeloda  prescription with additional 12 tablets for current cycle - Schedule follow-up appointment for the week of January 20th - Consider new medication Darice) if disease  progresses or patient opts for more aggressive treatment  Acid Reflux and abdominal pain  Acid reflux managed with alkaline water.  Advised to avoid alkaline water around the time of taking Xeloda  to ensure proper absorption. - Avoid alkaline water one hour before and two hours after taking Xeloda   General Health Maintenance Discussed the importance of regular scans to monitor cancer progression. PET scan preferred for sensitivity but high radiation exposure noted. - Order PET CT scan in February, pending insurance approval  Plan -Lab reviewed.  She will continue maintenance Xeloda , she started this cycle 2 days ago - Schedule follow-up appointment for the week of January 20th.  -I ordered PET scan to be scheduled in 5 weeks -Patient and her family (daughter and son) had many questions about how to monitor her disease, and if she needs to go on zolbetuximab etc, I answered to their satisfaction.        SUMMARY OF ONCOLOGIC HISTORY: Oncology History Overview Note   Cancer Staging  Gastric cancer [facility]) Staging form: Stomach, AJCC 8th Edition - Clinical stage from 04/19/2022: Stage IVB (cT2, cN0, pM1) - Signed by [name], MD on 05/08/2022 Total positive nodes: 0     Gastric cancer (HCC)  03/30/2022 Procedure   EGD:  Impression:  - Normal esophagus. - A few gastric polyps. Biopsied. - Gastritis. Biopsied. - Non-bleeding gastric ulcer with no stigmata of bleeding. Biopsied. - Normal examined duodenum. Biopsied.  Findings: Diffuse moderate inflammation characterized by congestion (edema), friability and granularity was found in the cardia, in the gastric fundus and in the gastric body. There were associated erosions in multiple places. Biopsies were taken from the antrum, body, and fundus with a cold forceps for histology. Estimated blood loss was minimal.  One non-bleeding cratered gastric ulcer with no stigmata of bleeding was found on the greater curvature of the stomach. The lesion was 6 mm in largest dimension. The mucosa around the ulcer was heaped and led to some deformity in the antrum. Biopsies were  taken with a cold forceps for histology. Estimated blood loss was minimal.    03/30/2022 Pathology Results   Patient: Oliveira, Daleigh P  Accession: [id number]  Diagnosis 1. Surgical [P], duodenal - BENIGN SMALL BOWEL MUCOSA WITH NO SIGNIFICANT PATHOLOGIC CHANGES 2. Surgical [P], gastric antrum - GASTRIC ANTRAL MUCOSA WITH FEATURES OF REACTIVE GASTROPATHY - NEGATIVE FOR H. PYLORI ON H&E STAIN - NEGATIVE FOR INTESTINAL METAPLASIA OR MALIGNANCY 3. Surgical [P], gastric body - GASTRIC OXYNTIC MUCOSA WITH REACTIVE/REPARATIVE CHANGES - NEGATIVE FOR H. PYLORI ON H&E STAIN - NEGATIVE FOR INTESTINAL METAPLASIA, DYSPLASIA OR MALIGNANCY 4. Surgical [P], greater curve ulceration - ADENOCARCINOMA WITH SIGNET RING CELL FEATURES (SEE NOTE) 5. Surgical [P], gastric polyps - ADENOCARCINOMA WITH SIGNET RING CELL FEATURES (SEE NOTE) 6. Surgical [P], fundus (gastric) - ADENOCARCINOMA WITH SIGNET RING CELL FEATURES (SEE NOTE) 7. Surgical [P], colon, ascending, polyp (1) - TUBULAR ADENOMA. - NO HIGH GRADE DYSPLASIA OR MALIGNANCY. 8. Surgical [P], colon, transverse, polyp (1) - TUBULAR ADENOMA. - NO HIGH GRADE DYSPLASIA OR MALIGNANCY.    04/13/2022 Initial Diagnosis   Gastric cancer (HCC)   04/19/2022 Cancer Staging   Staging form: Stomach, AJCC 8th Edition - Clinical stage from 04/19/2022: Stage IVB (cT2, cN0, pM1) - Signed by [name], MD on 05/08/2022 Total positive nodes: 0   05/05/2022 Genetic Testing   Negative genetic testing on the Multi-cancer gene panel + RNA.  FH c.259C>T VUS identified.  The report date is May 05, 2022.  The  Multi-Cancer + RNA Panel offered by [facility] includes sequencing and/or deletion/duplication analysis of the following 70 genes:  AIP*, ALK, APC*, ATM*, AXIN2*, BAP1*, BARD1*, BLM*, BMPR1A*, BRCA1*, BRCA2*, BRIP1*, CDC73*, CDH1*, CDK4, CDKN1B*, CDKN2A, CHEK2*, CTNNA1*, DICER1*, EPCAM (del/dup only), EGFR, FH*, FLCN*, GREM1 (promoter dup only), HOXB13, KIT, LZTR1,  MAX*, MBD4, MEN1*, MET, MITF, MLH1*, MSH2*, MSH3*, MSH6*, MUTYH*, NF1*, NF2*, NTHL1*, PALB2*, PDGFRA, PMS2*, POLD1*, POLE*, POT1*, PRKAR1A*, PTCH1*, PTEN*, RAD51C*, RAD51D*, RB1*, RET, SDHA* (sequencing only), SDHAF2*, SDHB*, SDHC*, SDHD*, SMAD4*, SMARCA4*, SMARCB1*, SMARCE1*, STK11*, SUFU*, TMEM127*, TP53*, TSC1*, TSC2*, VHL*. RNA analysis is performed for * genes.    05/09/2022 - 06/07/2022 Chemotherapy   Patient is on Treatment Plan : GASTROESOPHAGEAL FLOT q14d X 4 cycles      Miscellaneous   Foundation One  Biomarker Findings Microsatellite status- Cannot be determined Tumor Mutational Burden- Cannot be determined  Genomic Findings  FGFR2 amplification,FGFR2-TACC2 fusion,  Rearrangement intron 17 ARAF amplification CCND3 amplification TP53 [id number]*74     05/30/2022 Imaging    IMPRESSION: 1. Mild hypermetabolism corresponding to a dominant left upper quadrant mass and smaller perigastric nodules or nodes. 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 Recommend attention to the dominant left upper quadrant soft tissue mass on follow-up exams to exclude unlikely recurrent lymphoma. 2. No gastric hypermetabolism and no typical findings of metastatic disease.   06/20/2022 -  Chemotherapy   Patient is on Treatment Plan : GASTRIC FOLFOX q14d x 12 cycles     08/27/2022 Imaging    IMPRESSION: No focal gastric mass on CT.   No findings suspicious for recurrent or metastatic disease.   Stable left upper abdominal soft tissue lesion and small lymph nodes, chronic, favoring treated lymphoma.   11/27/2022 Imaging    IMPRESSION: 1. Questionable thickening of the distal esophagus/GE junction and gastric antrum, consider further evaluation with endoscopy. 2. Chronically stable left upper quadrant nodularity and prominent lymph nodes again favored treated lymphoma. Continued attention on follow-up imaging suggested. 3. No convincing evidence of  metastatic disease in the chest, abdomen or pelvis. 4. Questionable asymmetric wall thickening of the rectum, consider further evaluation with colonoscopy. 5. Mild wall thickening of a nondistended urinary bladder, correlate with urinalysis to exclude cystitis. 6. Hepatic steatosis.      Discussed the use of AI scribe software for clinical note transcription with the patient, who gave verbal consent to proceed.  History of Present Illness   A 58 year old female with a history of gastric cancer presents for a follow-up appointment. She is currently on oral chemotherapy (Xeloda ) and has been managing her symptoms with alkaline water. She reports that the alkaline water has been effective in reducing her acid reflux and associated pain. However, she expresses concern about the potential side effects of a new medication that has been suggested to her. She is particularly worried about feeling sick and weak, as she did with a previous aggressive medication. She also inquires about the duration of her current medication and whether there is a time limit for its use. The patient is also considering a stem cell transplant and asks for more information about this treatment option.         All other systems were reviewed with the patient and are negative.  MEDICAL HISTORY:  Past Medical History:  Diagnosis Date   Blood transfusion without reported diagnosis    had transfusion with hysterectomy   Cataract    Colon polyps 2012   Diabetes (HCC) 03/13/2021  Diabetes (HCC) 05/21/2019   Family history of breast cancer    Family history of pancreatic cancer    Family history of stomach cancer    Fibroid    gastric ca 03/2022   GERD (gastroesophageal reflux disease)    H/O blood clots    History of hysterectomy    fibroids and heavy cycles   Hypertension     SURGICAL HISTORY: Past Surgical History:  Procedure Laterality Date   ABDOMINAL HYSTERECTOMY     BIOPSY  04/19/2022   Procedure:  BIOPSY;  Surgeon: Wilhelmenia Aloha Raddle., MD;  Location: [facility];  Service: Gastroenterology;;   BIOPSY  03/21/2023   Procedure: BIOPSY;  Surgeon: Wilhelmenia Aloha Raddle., MD;  Location: [facility];  Service: Gastroenterology;;   COLONOSCOPY     ESOPHAGOGASTRODUODENOSCOPY (EGD) WITH PROPOFOL  N/A 04/19/2022   Procedure: ESOPHAGOGASTRODUODENOSCOPY (EGD) WITH PROPOFOL ;  Surgeon: Wilhelmenia Aloha Raddle., MD;  Location: [facility];  Service: Gastroenterology;  Laterality: N/A;   ESOPHAGOGASTRODUODENOSCOPY (EGD) WITH PROPOFOL  N/A 03/21/2023   Procedure: ESOPHAGOGASTRODUODENOSCOPY (EGD) WITH PROPOFOL ;  Surgeon: Wilhelmenia Aloha Raddle., MD;  Location: [facility];  Service: Gastroenterology;  Laterality: N/A;   EUS N/A 04/19/2022   Procedure: UPPER ENDOSCOPIC ULTRASOUND (EUS) RADIAL;  Surgeon: Wilhelmenia Aloha Raddle., MD;  Location: [facility];  Service: Gastroenterology;  Laterality: N/A;   EXCISION OF SKIN TAG  05/03/2022   Procedure: EXCISION OF CHEST WALL SKIN LESION;  Surgeon: Dasie Leonor CROME, MD;  Location: [facility];  Service: General;;   LAPAROSCOPY N/A 05/03/2022   Procedure: LAPAROSCOPY DIAGNOSTIC WITH PERITONEAL WASHINGS;  Surgeon: Dasie Leonor CROME, MD;  Location: [facility];  Service: General;  Laterality: N/A;   POLYPECTOMY  04/19/2022   Procedure: POLYPECTOMY;  Surgeon: Wilhelmenia Aloha Raddle., MD;  Location: [facility];  Service: Gastroenterology;;   PORTACATH PLACEMENT N/A 05/03/2022   Procedure: INSERTION PORT-A-CATH WITH ULTRASOUND GUIDANCE;  Surgeon: Dasie Leonor CROME, MD;  Location: [facility];  Service: General;  Laterality: N/A;   UPPER GASTROINTESTINAL ENDOSCOPY      I have reviewed the social history and family history with the patient and they are unchanged from previous note.  ALLERGIES:  is allergic to aspirin, cyclobenzaprine, naproxen sodium, zithromax [azithromycin dihydrate], oxaliplatin , and dilaudid [hydromorphone].  MEDICATIONS:  Current Outpatient Medications  Medication  Sig Dispense Refill   acetaminophen  (TYLENOL ) 500 MG tablet Take 2 tablets (1,000 mg total) by mouth every 8 (eight) hours as needed (pain). 30 tablet 1   amLODipine  (NORVASC ) 10 MG tablet Take 1 tablet (10 mg total) by mouth daily. 30 tablet 0   b complex vitamins capsule Take 1 capsule by mouth daily.     capecitabine  (XELODA ) 500 MG tablet Take 3 tabs in morning and 3 tabs in evening. Take within 30 minutes after meals. Take for 14 days on, then off for 7 days. Repeat every 21 days. Please fill #12 for current cycle and one full next cycle 96 tablet 0   Cyanocobalamin (VITAMIN B 12 PO) Take by mouth.     famotidine  (PEPCID ) 20 MG tablet Take 1 tablet (20 mg total) by mouth 2 (two) times daily. 60 tablet 2   HYDROcodone -acetaminophen  (NORCO/VICODIN) 5-325 MG tablet Take 1 tablet by mouth every 6 (six) hours as needed for moderate pain. 10 tablet 0   lidocaine -prilocaine  (EMLA ) cream Apply 1 Application topically as needed. 30 g 1   POTASSIUM PO Take 99 mg by mouth daily.     sucralfate  (CARAFATE ) 1 g tablet Take 1 tablet (1 g total) by  mouth 2 (two) times daily. 60 tablet 6   No current facility-administered medications for this visit.    PHYSICAL EXAMINATION: ECOG PERFORMANCE STATUS: 1 - Symptomatic but completely ambulatory  Vitals:   05/08/23 1032  BP: (!) 136/90  Pulse: 73  Resp: 17  Temp: (!) 97.3 F (36.3 C)  SpO2: 100%   Wt Readings from Last 3 Encounters:  05/08/23 204 lb 14.4 oz (92.9 kg)  04/10/23 200 lb 9.6 oz (91 kg)  03/21/23 201 lb 4.5 oz (91.3 kg)     GENERAL:alert, no distress and comfortable SKIN: skin color, texture, turgor are normal, no rashes or significant lesions EYES: normal, Conjunctiva are pink and non-injected, sclera clear NECK: supple, thyroid  normal size, non-tender, without nodularity LYMPH:  no palpable lymphadenopathy in the cervical, axillary  LUNGS: clear to auscultation and percussion with normal breathing effort HEART: regular rate &  rhythm and no murmurs and no lower extremity edema ABDOMEN:abdomen soft, non-tender and normal bowel sounds Musculoskeletal:no cyanosis of digits and no clubbing  NEURO: alert & oriented x 3 with fluent speech, no focal motor/sensory deficits   LABORATORY DATA:  I have reviewed the data as listed    Latest Ref Rng & Units 05/08/2023   10:06 AM 04/10/2023    8:48 AM 03/20/2023    8:53 AM  CBC  WBC 4.0 - 10.5 K/uL 4.2  3.1  5.6   Hemoglobin 12.0 - 15.0 g/dL 86.3  87.2  86.6   Hematocrit 36.0 - 46.0 % 37.7  35.4  35.8   Platelets 150 - 400 K/uL 257  239  285         Latest Ref Rng & Units 05/08/2023   10:06 AM 04/10/2023    8:48 AM 03/20/2023    8:53 AM  CMP  Glucose 70 - 99 mg/dL 880  875  853   BUN 6 - 20 mg/dL 12  10  13    Creatinine 0.44 - 1.00 mg/dL 9.34  9.36  9.34   Sodium 135 - 145 mmol/L 140  137  139   Potassium 3.5 - 5.1 mmol/L 3.5  3.2  3.3   Chloride 98 - 111 mmol/L 105  102  104   CO2 22 - 32 mmol/L 28  26  28    Calcium  8.9 - 10.3 mg/dL 9.3  9.1  9.6   Total Protein 6.5 - 8.1 g/dL 7.1  7.5  7.2   Total Bilirubin 0.0 - 1.2 mg/dL 1.1  1.1  1.0   Alkaline Phos 38 - 126 U/L 135  121  120   AST 15 - 41 U/L 39  40  35   ALT 0 - 44 U/L 31  33  28       RADIOGRAPHIC STUDIES: I have personally reviewed the radiological images as listed and agreed with the findings in the report. No results found.    Orders Placed This Encounter  Procedures   NM PET Image Restage (PS) Skull Base to Thigh (F-18 FDG)    Standing Status:   Future    Expected Date:   06/05/2023    Expiration Date:   05/07/2024    If indicated for the ordered procedure, I authorize the administration of a radiopharmaceutical per Radiology protocol:   Yes    Is the patient pregnant?:   No    Preferred imaging location?:   [facility]    Radiology Contrast Protocol - do NOT remove file path:   [email or web address]   All  questions were answered. The patient  knows to call the clinic with any problems, questions or concerns. No barriers to learning was detected. The total time spent in the appointment was 40 minutes.     Onita Mattock, MD 05/08/2023

## 2023-05-09 ENCOUNTER — Other Ambulatory Visit: Payer: Self-pay

## 2023-05-14 ENCOUNTER — Other Ambulatory Visit (INDEPENDENT_AMBULATORY_CARE_PROVIDER_SITE_OTHER): Payer: Medicaid Other

## 2023-05-14 ENCOUNTER — Encounter: Payer: Self-pay | Admitting: Orthopedic Surgery

## 2023-05-14 ENCOUNTER — Other Ambulatory Visit (INDEPENDENT_AMBULATORY_CARE_PROVIDER_SITE_OTHER): Payer: Self-pay

## 2023-05-14 ENCOUNTER — Ambulatory Visit (INDEPENDENT_AMBULATORY_CARE_PROVIDER_SITE_OTHER): Payer: Medicaid Other | Admitting: Orthopedic Surgery

## 2023-05-14 DIAGNOSIS — G5793 Unspecified mononeuropathy of bilateral lower limbs: Secondary | ICD-10-CM

## 2023-05-14 DIAGNOSIS — M79672 Pain in left foot: Secondary | ICD-10-CM | POA: Diagnosis not present

## 2023-05-14 DIAGNOSIS — M79671 Pain in right foot: Secondary | ICD-10-CM | POA: Diagnosis not present

## 2023-05-14 NOTE — Progress Notes (Signed)
 Office Visit Note   [name]           Date of Birth: [birth year]           MRN: [id number] Visit Date: 05/14/2023              Requested by: [name], MD [address] [place],  [place] [address] PCP: [name], MD  Chief Complaint  Patient presents with   Right Foot - Pain   Left Foot - Pain      HPI: Patient is a 58 year old woman who is seen for initial evaluation for neuropathic pain both feet, primarily in her toes up to the ball of her foot, worse in the left than the right foot.  Patient states she has a burning feeling underneath the ball of her feet.  Feels like she is walking in sand.  Treated for gastric cancer with chemotherapy since January 2024.  Patient also has history of prediabetes with most recent hemoglobin A1c 6.8.  She states she does take a multiple B vitamin.    Assessment & Plan: Visit Diagnoses:  1. Bilateral foot pain   2. Neuropathic pain of both feet     Plan: Discussed that her symptoms most likely neuropathic either from prediabetes, chemotherapy, or swelling.  Discussed that she could try Neurontin or oral diabetic medicines to help with the neuropathic pain and knee-high compression socks to help decrease the swelling.  Patient states she will see talk with her heme-onc doctor to see which medications she can take.  Follow-Up Instructions: No follow-ups on file.   Ortho Exam  Patient is alert, oriented, no adenopathy, well-dressed, normal affect, normal respiratory effort. Examination patient has palpable pulses.  She has good dorsiflexion of the ankle with the knee extended she does have venous stasis swelling.  She does have bilateral bunion deformities.  No open ulcers no skin color or temperature changes.  Radiographs show no bone lesions or fractures.  Imaging: XR Foot 2 Views Right Result Date: 05/14/2023 2 view radiographs of the right foot shows no fractures.  No bone lesions.  XR Foot 2 Views  Left Result Date: 05/14/2023 2 view radiographs of the left foot shows no fractures.  No bone lesions.  No images are attached to the encounter.  Labs: Lab Results  Component Value Date   HGBA1C 5.6 03/12/2023   HGBA1C 6.8 (A) 12/29/2021   HGBA1C 6.6 (A) 06/29/2021   REPTSTATUS 09/03/2010 FINAL 09/01/2010   CULT NO GROWTH 09/01/2010     Lab Results  Component Value Date   ALBUMIN 4.3 05/08/2023   ALBUMIN 4.4 04/10/2023   ALBUMIN 4.3 03/20/2023    Lab Results  Component Value Date   MG 2.1 03/12/2023   MG 2.1 01/23/2022   No results found for: VD25OH  No results found for: PREALBUMIN    Latest Ref Rng & Units 05/08/2023   10:06 AM 04/10/2023    8:48 AM 03/20/2023    8:53 AM  CBC EXTENDED  WBC 4.0 - 10.5 K/uL 4.2  3.1  5.6   RBC 3.87 - 5.11 MIL/uL 3.90  3.62  3.72   Hemoglobin 12.0 - 15.0 g/dL 86.3  87.2  86.6   HCT 36.0 - 46.0 % 37.7  35.4  35.8   Platelets 150 - 400 K/uL 257  239  285   NEUT# 1.7 - 7.7 K/uL 2.7  1.6  4.0   Lymph# 0.7 - 4.0 K/uL 0.9  0.8  0.9      There is no height or weight on file to calculate BMI.  Orders:  Orders Placed This Encounter  Procedures   XR Foot 2 Views Right   XR Foot 2 Views Left   No orders of the defined types were placed in this encounter.    Procedures: No procedures performed  Clinical Data: No additional findings.  ROS:  All other systems negative, except as noted in the HPI. Review of Systems  Objective: Vital Signs: LMP 06/29/2010   Specialty Comments:  No specialty comments available.  PMFS History: Patient Active Problem List   Diagnosis Date Noted   Need for immunization against influenza 03/13/2023   Gastroesophageal reflux disease with esophagitis without hemorrhage 03/13/2023   Port-A-Cath in place 05/23/2022   Genetic testing 05/21/2022   Gastric cancer (HCC) 04/13/2022   Hypertension 01/24/2022   Type II diabetes mellitus with manifestations (HCC) 01/24/2022   Need for vaccination  01/24/2022   Gastroesophageal reflux disease without esophagitis 01/23/2022   Hyperlipidemia LDL goal <100 01/23/2022   Diuretic-induced hypokalemia 01/23/2022   Past Medical History:  Diagnosis Date   Blood transfusion without reported diagnosis    had transfusion with hysterectomy   Cataract    Colon polyps 2012   Diabetes (HCC) 03/13/2021   Diabetes (HCC) 05/21/2019   Family history of breast cancer    Family history of pancreatic cancer    Family history of stomach cancer    Fibroid    gastric ca 03/2022   GERD (gastroesophageal reflux disease)    H/O blood clots    History of hysterectomy    fibroids and heavy cycles   Hypertension     Family History  Problem Relation Age of Onset   Stroke Mother    Diabetes Mother    Hypertension Mother    Multiple myeloma Mother    Stroke Father    Pancreatic cancer Maternal Aunt    Stomach cancer Maternal Uncle    Breast cancer Paternal Aunt    Stomach cancer Paternal Aunt    Stomach cancer Paternal Uncle    Heart attack Maternal Grandmother    Breast cancer Paternal Grandmother    Diabetes Other    Hypertension Other    Stroke Other    Cancer Other    Heart attack Other    Esophageal cancer Neg Hx    Liver disease Neg Hx    Colon cancer Neg Hx    Rectal cancer Neg Hx     Past Surgical History:  Procedure Laterality Date   ABDOMINAL HYSTERECTOMY     BIOPSY  04/19/2022   Procedure: BIOPSY;  Surgeon: Wilhelmenia, Aloha Raddle., MD;  Location: [facility];  Service: Gastroenterology;;   BIOPSY  03/21/2023   Procedure: BIOPSY;  Surgeon: Wilhelmenia Aloha Raddle., MD;  Location: [facility];  Service: Gastroenterology;;   COLONOSCOPY     ESOPHAGOGASTRODUODENOSCOPY (EGD) WITH PROPOFOL  N/A 04/19/2022   Procedure: ESOPHAGOGASTRODUODENOSCOPY (EGD) WITH PROPOFOL ;  Surgeon: Wilhelmenia Aloha Raddle., MD;  Location: [facility];  Service: Gastroenterology;  Laterality: N/A;   ESOPHAGOGASTRODUODENOSCOPY (EGD) WITH PROPOFOL  N/A  03/21/2023   Procedure: ESOPHAGOGASTRODUODENOSCOPY (EGD) WITH PROPOFOL ;  Surgeon: Wilhelmenia Aloha Raddle., MD;  Location: [facility];  Service: Gastroenterology;  Laterality: N/A;   EUS N/A 04/19/2022   Procedure: UPPER ENDOSCOPIC ULTRASOUND (EUS) RADIAL;  Surgeon: Wilhelmenia Aloha Raddle., MD;  Location: [facility];  Service: Gastroenterology;  Laterality: N/A;   EXCISION OF SKIN TAG  05/03/2022   Procedure: EXCISION OF CHEST WALL  SKIN LESION;  Surgeon: Dasie Leonor CROME, MD;  Location: [facility] OR;  Service: General;;   LAPAROSCOPY N/A 05/03/2022   Procedure: LAPAROSCOPY DIAGNOSTIC WITH PERITONEAL WASHINGS;  Surgeon: Dasie Leonor CROME, MD;  Location: [facility] OR;  Service: General;  Laterality: N/A;   POLYPECTOMY  04/19/2022   Procedure: POLYPECTOMY;  Surgeon: Wilhelmenia Aloha Raddle., MD;  Location: [facility];  Service: Gastroenterology;;   PORTACATH PLACEMENT N/A 05/03/2022   Procedure: INSERTION PORT-A-CATH WITH ULTRASOUND GUIDANCE;  Surgeon: Dasie Leonor CROME, MD;  Location: [facility] OR;  Service: General;  Laterality: N/A;   UPPER GASTROINTESTINAL ENDOSCOPY     Social History   Occupational History   Occupation: [employer]  Tobacco Use   Smoking status: Never   Smokeless tobacco: Never  Vaping Use   Vaping status: Never Used  Substance and Sexual Activity   Alcohol use: No   Drug use: No   Sexual activity: Yes    Birth control/protection: Surgical    Comment: Hyst

## 2023-05-16 ENCOUNTER — Telehealth: Payer: Self-pay | Admitting: Hematology

## 2023-05-16 ENCOUNTER — Other Ambulatory Visit: Payer: Self-pay

## 2023-05-16 NOTE — Telephone Encounter (Signed)
TC from Pt stating she has some nausea, and would like to know which nausea medication she can take. Returned call to Pt informed her that she can start with the zofran which she can take every 8 hours and if that doesn't work she can take the compazine which is every 6 she can rotate both medications if the first one does not work within the hours of prescribed.

## 2023-05-21 ENCOUNTER — Other Ambulatory Visit: Payer: Self-pay

## 2023-05-21 NOTE — Assessment & Plan Note (Addendum)
[id number] with peritoneal metastasis. 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   -she started first line chemo FLOT on 1/10 -She understands that chemotherapy is palliative, to prolong her life.  We are unlikely going to cure her cancer. -PD-L1 0-1%, no significant benefit from PD-L1 immunotherapy, FO revealed FGFR2 amplification and fusion (+), FGFR inhibitors can be considered in future, no other targeted therapy available  -She has been tolerating chemo very well, will continue for now  -PET scan from 05/30/2022 was negative for primary tumor or metastatic disease, the known peritoneal mets did not show on PET.  -she has been tolerating chemo well overall. Due to fatigue, I have changed her chemo from FLOT to FOLFOX on 06/20/2022, she tolerated well -she previously asked the role of surgery, depends on her next restaging CT scan findings, I may refer her to [facility] or [facility] to discuss HIPEC surgery  -due to her infusion reaction to oxaliplatin on C5, we added additional premeds and gave slow infusion over 4 hours for cycle 6 and she tolerated well  -She is not able to return to work due to the cancer and treatment related symptoms.  -She is tolerating FOLFOX well overall, with moderate fatigue for a few days after infusion but able to recover well.  No signs of neuropathy at this point. -Restaging CT abdomen pelvis from August 27, 2022 showed no residual disease.  -We again discussed maintenance therapy with Xeloda down the road, we will stop oxaliplatin when she develops side effects especially neuropathy, or after next scan -repeated staging CT from 11/26/2021 showed stable disease  -I have changed her treatment to maintenance Xeloda in early August 2024, she is tolerating well overall  -her NGS Caris showed positive Claudin 18.2, she is a  candidate for zolbetuximab.  -Repeated EGD on March 21, 2023 showed residual gastric cancer.  We discussed option of changing her chemotherapy back to FOLFOX next month and add zolbetuximab. She wants to wait until next PET on 2/3.

## 2023-05-21 NOTE — Progress Notes (Signed)
Specialty Pharmacy Refill Coordination Note  Sheryl [name] is a 58 y.o. female contacted today regarding refills of specialty medication(s) Capecitabine (XELODA)   Patient requested Delivery   Delivery date: 05/23/23   Verified address: [address]  [place] [address]   Medication will be filled on 05/22/23.

## 2023-05-22 ENCOUNTER — Inpatient Hospital Stay: Payer: Medicaid Other

## 2023-05-22 ENCOUNTER — Encounter: Payer: Self-pay | Admitting: Hematology

## 2023-05-22 ENCOUNTER — Inpatient Hospital Stay (HOSPITAL_BASED_OUTPATIENT_CLINIC_OR_DEPARTMENT_OTHER): Payer: Medicaid Other | Admitting: Hematology

## 2023-05-22 VITALS — BP 130/93 | HR 76 | Temp 97.3°F | Resp 17 | Wt 203.1 lb

## 2023-05-22 DIAGNOSIS — C162 Malignant neoplasm of body of stomach: Secondary | ICD-10-CM | POA: Diagnosis not present

## 2023-05-22 DIAGNOSIS — C169 Malignant neoplasm of stomach, unspecified: Secondary | ICD-10-CM | POA: Diagnosis not present

## 2023-05-22 LAB — CMP (CANCER CENTER ONLY)
ALT: 30 U/L (ref 0–44)
AST: 37 U/L (ref 15–41)
Albumin: 4.2 g/dL (ref 3.5–5.0)
Alkaline Phosphatase: 129 U/L — ABNORMAL HIGH (ref 38–126)
Anion gap: 7 (ref 5–15)
BUN: 8 mg/dL (ref 6–20)
CO2: 28 mmol/L (ref 22–32)
Calcium: 9.3 mg/dL (ref 8.9–10.3)
Chloride: 105 mmol/L (ref 98–111)
Creatinine: 0.63 mg/dL (ref 0.44–1.00)
GFR, Estimated: >60 mL/min (ref >60)
Glucose, Bld: 118 mg/dL — ABNORMAL HIGH (ref 70–99)
Potassium: 3.2 mmol/L — ABNORMAL LOW (ref 3.5–5.1)
Sodium: 140 mmol/L (ref 135–145)
Total Bilirubin: 0.6 mg/dL (ref 0.0–1.2)
Total Protein: 7 g/dL (ref 6.5–8.1)

## 2023-05-22 LAB — CBC WITH DIFFERENTIAL (CANCER CENTER ONLY)
Abs Immature Granulocytes: 0 10*3/uL (ref 0.00–0.07)
Basophils Absolute: 0 10*3/uL (ref 0.0–0.1)
Basophils Relative: 1 %
Eosinophils Absolute: 0.2 10*3/uL (ref 0.0–0.5)
Eosinophils Relative: 7 %
HCT: 37.3 % (ref 36.0–46.0)
Hemoglobin: 13.3 g/dL (ref 12.0–15.0)
Immature Granulocytes: 0 %
Lymphocytes Relative: 27 %
Lymphs Abs: 0.9 10*3/uL (ref 0.7–4.0)
MCH: 34.5 pg — ABNORMAL HIGH (ref 26.0–34.0)
MCHC: 35.7 g/dL (ref 30.0–36.0)
MCV: 96.9 fL (ref 80.0–100.0)
Monocytes Absolute: 0.5 10*3/uL (ref 0.1–1.0)
Monocytes Relative: 16 %
Neutro Abs: 1.6 10*3/uL — ABNORMAL LOW (ref 1.7–7.7)
Neutrophils Relative %: 49 %
Platelet Count: 259 10*3/uL (ref 150–400)
RBC: 3.85 MIL/uL — ABNORMAL LOW (ref 3.87–5.11)
RDW: 16.2 % — ABNORMAL HIGH (ref 11.5–15.5)
WBC Count: 3.2 10*3/uL — ABNORMAL LOW (ref 4.0–10.5)
nRBC: 0 % (ref 0.0–0.2)

## 2023-05-22 NOTE — Progress Notes (Signed)
[facility]   Telephone:([phone] Fax:([phone]   Clinic Follow up Note   Patient Care Team: [name], MD as PCP - General (Internal Medicine) [name], MD as Consulting Physician (Oncology)  Date of Service:  05/22/2023  CHIEF COMPLAINT: f/u of gastric cancer  CURRENT THERAPY:  Maintenance Xeloda 2 weeks on, and 1 week off  Oncology History   Gastric cancer (HCC) cT2N0M1 with peritoneal metastasis. 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   -she started first line chemo FLOT on 1/10 -She understands that chemotherapy is palliative, to prolong her life.  We are unlikely going to cure her cancer. -PD-L1 0-1%, no significant benefit from PD-L1 immunotherapy, FO revealed FGFR2 amplification and fusion (+), FGFR inhibitors can be considered in future, no other targeted therapy available  -She has been tolerating chemo very well, will continue for now  -PET scan from 05/30/2022 was negative for primary tumor or metastatic disease, the known peritoneal mets did not show on PET.  -she has been tolerating chemo well overall. Due to fatigue, I have changed her chemo from FLOT to FOLFOX on 06/20/2022, she tolerated well -she previously asked the role of surgery, depends on her next restaging CT scan findings, I may refer her to [facility] or [facility] to discuss HIPEC surgery  -due to her infusion reaction to oxaliplatin on C5, we added additional premeds and gave slow infusion over 4 hours for cycle 6 and she tolerated well  -She is not able to return to work due to the cancer and treatment related symptoms.  -She is tolerating FOLFOX well overall, with moderate fatigue for a few days after infusion but able to recover well.  No signs of neuropathy at this point. -Restaging CT abdomen pelvis from August 27, 2022  showed no residual disease.  -We again discussed maintenance therapy with Xeloda down the road, we will stop oxaliplatin when she develops side effects especially neuropathy, or after next scan -repeated staging CT from 11/26/2021 showed stable disease  -I have changed her treatment to maintenance Xeloda in early August 2024, she is tolerating well overall  -her NGS Caris showed positive Claudin 18.2, she is a candidate for zolbetuximab.  -Repeated EGD on March 21, 2023 showed residual gastric cancer.  We discussed option of changing her chemotherapy back to FOLFOX next month and add zolbetuximab. She wants to wait until next PET on 2/3.     Assessment and Plan    Gastric Cancer Follow-up for gastric cancer. Reports intermittent sharp, burning stomach pain lasting about an hour, occurring once daily, sometimes with nausea and vomiting. Pain managed with antiemetics. Appetite and energy levels are good, and weight is stable. Blood counts show mild leukopenia due to capecitabine (Xeloda), no anemia, and normal platelet counts. PET scan scheduled in two weeks to assess treatment efficacy and potential need for change. Discussed balancing quality of life and disease control with current treatment versus new drug options. Decision to continue current regimen unless PET scan indicates need for change. - Continue capecitabine (Xeloda) 3 tablets twice daily for 2 weeks, then 1 week off - Review PET scan results in three weeks to determine if treatment change is necessary - Schedule lab flush on the day of PET scan - No need for repeat lab when returning in three weeks if labs are done during PET scan  Chemotherapy-Induced  Nausea and Vomiting Nausea and vomiting associated with sharp stomach pain, managed effectively with antiemetics. - Continue using antiemetics as needed for pain and vomiting  Follow-up - Follow-up visit in three weeks to review PET scan results and discuss potential treatment  changes.  -continue xeloda         SUMMARY OF ONCOLOGIC HISTORY: Oncology History Overview Note   Cancer Staging  Gastric cancer [facility]) Staging form: Stomach, AJCC 8th Edition - Clinical stage from 04/19/2022: Stage IVB (cT2, cN0, pM1) - Signed by [name], MD on 05/08/2022 Total positive nodes: 0     Gastric cancer (HCC)  03/30/2022 Procedure   EGD:  Impression:  - Normal esophagus. - A few gastric polyps. Biopsied. - Gastritis. Biopsied. - Non-bleeding gastric ulcer with no stigmata of bleeding. Biopsied. - Normal examined duodenum. Biopsied.  Findings: Diffuse moderate inflammation characterized by congestion (edema), friability and granularity was found in the cardia, in the gastric fundus and in the gastric body. There were associated erosions in multiple places. Biopsies were taken from the antrum, body, and fundus with a cold forceps for histology. Estimated blood loss was minimal.  One non-bleeding cratered gastric ulcer with no stigmata of bleeding was found on the greater curvature of the stomach. The lesion was 6 mm in largest dimension. The mucosa around the ulcer was heaped and led to some deformity in the antrum. Biopsies were taken with a cold forceps for histology. Estimated blood loss was minimal.    03/30/2022 Pathology Results   [name], MENDELL  Accession: [id number]  Diagnosis 1. Surgical [P], duodenal - BENIGN SMALL BOWEL MUCOSA WITH NO SIGNIFICANT PATHOLOGIC CHANGES 2. Surgical [P], gastric antrum - GASTRIC ANTRAL MUCOSA WITH FEATURES OF REACTIVE GASTROPATHY - NEGATIVE FOR H. PYLORI ON H&E STAIN - NEGATIVE FOR INTESTINAL METAPLASIA OR MALIGNANCY 3. Surgical [P], gastric body - GASTRIC OXYNTIC MUCOSA WITH REACTIVE/REPARATIVE CHANGES - NEGATIVE FOR H. PYLORI ON H&E STAIN - NEGATIVE FOR INTESTINAL METAPLASIA, DYSPLASIA OR MALIGNANCY 4. Surgical [P], greater curve ulceration - ADENOCARCINOMA WITH SIGNET RING CELL FEATURES (SEE NOTE) 5.  Surgical [P], gastric polyps - ADENOCARCINOMA WITH SIGNET RING CELL FEATURES (SEE NOTE) 6. Surgical [P], fundus (gastric) - ADENOCARCINOMA WITH SIGNET RING CELL FEATURES (SEE NOTE) 7. Surgical [P], colon, ascending, polyp (1) - TUBULAR ADENOMA. - NO HIGH GRADE DYSPLASIA OR MALIGNANCY. 8. Surgical [P], colon, transverse, polyp (1) - TUBULAR ADENOMA. - NO HIGH GRADE DYSPLASIA OR MALIGNANCY.    04/13/2022 Initial Diagnosis   Gastric cancer (HCC)   04/19/2022 Cancer Staging   Staging form: Stomach, AJCC 8th Edition - Clinical stage from 04/19/2022: Stage IVB (cT2, cN0, pM1) - Signed by [name], MD on 05/08/2022 Total positive nodes: 0   05/05/2022 Genetic Testing   Negative genetic testing on the Multi-cancer gene panel + RNA.  FH c.259C>T VUS identified.  The report date is May 05, 2022.  The Multi-Cancer + RNA Panel offered by [facility] includes sequencing and/or deletion/duplication analysis of the following 70 genes:  AIP*, ALK, APC*, ATM*, AXIN2*, BAP1*, BARD1*, BLM*, BMPR1A*, BRCA1*, BRCA2*, BRIP1*, CDC73*, CDH1*, CDK4, CDKN1B*, CDKN2A, CHEK2*, CTNNA1*, DICER1*, EPCAM (del/dup only), EGFR, FH*, FLCN*, GREM1 (promoter dup only), HOXB13, KIT, LZTR1, MAX*, MBD4, MEN1*, MET, MITF, MLH1*, MSH2*, MSH3*, MSH6*, MUTYH*, NF1*, NF2*, NTHL1*, PALB2*, PDGFRA, PMS2*, POLD1*, POLE*, POT1*, PRKAR1A*, PTCH1*, PTEN*, RAD51C*, RAD51D*, RB1*, RET, SDHA* (sequencing only), SDHAF2*, SDHB*, SDHC*, SDHD*, SMAD4*, SMARCA4*, SMARCB1*, SMARCE1*, STK11*, SUFU*, TMEM127*, TP53*, TSC1*, TSC2*, VHL*. RNA analysis is performed for * genes.  05/09/2022 - 06/07/2022 Chemotherapy   Patient is on Treatment Plan : GASTROESOPHAGEAL FLOT q14d X 4 cycles      Miscellaneous   Foundation One  Biomarker Findings Microsatellite status- Cannot be determined Tumor Mutational Burden- Cannot be determined  Genomic Findings  FGFR2 amplification,FGFR2-TACC2 fusion,  Rearrangement intron 17 ARAF amplification CCND3  amplification TP53 [id number]*74     05/30/2022 Imaging    IMPRESSION: 1. Mild hypermetabolism corresponding to a dominant left upper quadrant mass and smaller perigastric nodules or nodes. 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 Recommend attention to the dominant left upper quadrant soft tissue mass on follow-up exams to exclude unlikely recurrent lymphoma. 2. No gastric hypermetabolism and no typical findings of metastatic disease.   06/20/2022 -  Chemotherapy   Patient is on Treatment Plan : GASTRIC FOLFOX q14d x 12 cycles     08/27/2022 Imaging    IMPRESSION: No focal gastric mass on CT.   No findings suspicious for recurrent or metastatic disease.   Stable left upper abdominal soft tissue lesion and small lymph nodes, chronic, favoring treated lymphoma.   11/27/2022 Imaging    IMPRESSION: 1. Questionable thickening of the distal esophagus/GE junction and gastric antrum, consider further evaluation with endoscopy. 2. Chronically stable left upper quadrant nodularity and prominent lymph nodes again favored treated lymphoma. Continued attention on follow-up imaging suggested. 3. No convincing evidence of metastatic disease in the chest, abdomen or pelvis. 4. Questionable asymmetric wall thickening of the rectum, consider further evaluation with colonoscopy. 5. Mild wall thickening of a nondistended urinary bladder, correlate with urinalysis to exclude cystitis. 6. Hepatic steatosis.      Discussed the use of AI scribe software for clinical note transcription with the patient, who gave verbal consent to proceed.  History of Present Illness   The patient, a 58 year old female with a history of gastric cancer, presents for a follow-up visit. He reports intermittent stomach pain, described as a sharp, burning sensation that can last for about an hour. The pain is sometimes accompanied by vomiting. He has found that taking his prescribed  nausea medication can help alleviate the pain. The frequency of the pain varies, but it can occur as often as once a day. He also describes a sensation of "contracting" in his stomach. His appetite and energy levels are good, and his weight is stable. He is currently taking oral chemotherapy (capecitabine), which he will resume next week after a break. He is scheduled for a PET scan in two weeks.         All other systems were reviewed with the patient and are negative.  MEDICAL HISTORY:  Past Medical History:  Diagnosis Date   Blood transfusion without reported diagnosis    had transfusion with hysterectomy   Cataract    Colon polyps 2012   Diabetes (HCC) 03/13/2021   Diabetes (HCC) 05/21/2019   Family history of breast cancer    Family history of pancreatic cancer    Family history of stomach cancer    Fibroid    gastric ca 03/2022   GERD (gastroesophageal reflux disease)    H/O blood clots    History of hysterectomy    fibroids and heavy cycles   Hypertension     SURGICAL HISTORY: Past Surgical History:  Procedure Laterality Date   ABDOMINAL HYSTERECTOMY     BIOPSY  04/19/2022   Procedure: BIOPSY;  Surgeon: Lemar Lofty., MD;  Location: [facility];  Service: Gastroenterology;;  BIOPSY  03/21/2023   Procedure: BIOPSY;  Surgeon: Lemar Lofty., MD;  Location: [facility];  Service: Gastroenterology;;   COLONOSCOPY     ESOPHAGOGASTRODUODENOSCOPY (EGD) WITH PROPOFOL N/A 04/19/2022   Procedure: ESOPHAGOGASTRODUODENOSCOPY (EGD) WITH PROPOFOL;  Surgeon: Lemar Lofty., MD;  Location: [facility];  Service: Gastroenterology;  Laterality: N/A;   ESOPHAGOGASTRODUODENOSCOPY (EGD) WITH PROPOFOL N/A 03/21/2023   Procedure: ESOPHAGOGASTRODUODENOSCOPY (EGD) WITH PROPOFOL;  Surgeon: Meridee Score Netty Starring., MD;  Location: [facility];  Service: Gastroenterology;  Laterality: N/A;   EUS N/A 04/19/2022   Procedure: UPPER ENDOSCOPIC ULTRASOUND (EUS) RADIAL;   Surgeon: Lemar Lofty., MD;  Location: [facility];  Service: Gastroenterology;  Laterality: N/A;   EXCISION OF SKIN TAG  05/03/2022   Procedure: EXCISION OF CHEST WALL SKIN LESION;  Surgeon: Fritzi Mandes, MD;  Location: [facility] OR;  Service: General;;   LAPAROSCOPY N/A 05/03/2022   Procedure: LAPAROSCOPY DIAGNOSTIC WITH PERITONEAL WASHINGS;  Surgeon: Fritzi Mandes, MD;  Location: [facility] OR;  Service: General;  Laterality: N/A;   POLYPECTOMY  04/19/2022   Procedure: POLYPECTOMY;  Surgeon: Lemar Lofty., MD;  Location: [facility];  Service: Gastroenterology;;   PORTACATH PLACEMENT N/A 05/03/2022   Procedure: INSERTION PORT-A-CATH WITH ULTRASOUND GUIDANCE;  Surgeon: Fritzi Mandes, MD;  Location: [facility] OR;  Service: General;  Laterality: N/A;   UPPER GASTROINTESTINAL ENDOSCOPY      I have reviewed the social history and family history with the patient and they are unchanged from previous note.  ALLERGIES:  is allergic to aspirin, cyclobenzaprine, naproxen sodium, zithromax [azithromycin dihydrate], oxaliplatin, and dilaudid [hydromorphone].  MEDICATIONS:  Current Outpatient Medications  Medication Sig Dispense Refill   acetaminophen (TYLENOL) 500 MG tablet Take 2 tablets (1,000 mg total) by mouth every 8 (eight) hours as needed (pain). 30 tablet 1   amLODipine (NORVASC) 10 MG tablet Take 1 tablet (10 mg total) by mouth daily. 30 tablet 0   b complex vitamins capsule Take 1 capsule by mouth daily.     capecitabine (XELODA) 500 MG tablet Take 3 tabs in morning and 3 tabs in evening. Take within 30 minutes after meals. Take for 14 days on, then off for 7 days. Repeat every 21 days. Please fill #12 for current cycle and one full next cycle 96 tablet 0   Cyanocobalamin (VITAMIN B 12 PO) Take by mouth.     famotidine (PEPCID) 20 MG tablet Take 1 tablet (20 mg total) by mouth 2 (two) times daily. 60 tablet 2   HYDROcodone-acetaminophen (NORCO/VICODIN) 5-325 MG tablet Take 1 tablet by  mouth every 6 (six) hours as needed for moderate pain. 10 tablet 0   lidocaine-prilocaine (EMLA) cream Apply 1 Application topically as needed. 30 g 1   POTASSIUM PO Take 99 mg by mouth daily.     sucralfate (CARAFATE) 1 g tablet Take 1 tablet (1 g total) by mouth 2 (two) times daily. 60 tablet 6   No current facility-administered medications for this visit.    PHYSICAL EXAMINATION: ECOG PERFORMANCE STATUS: 1 - Symptomatic but completely ambulatory  Vitals:   05/22/23 0948  BP: (!) 130/93  Pulse: 76  Resp: 17  Temp: (!) 97.3 F (36.3 C)  SpO2: 99%   Wt Readings from Last 3 Encounters:  05/22/23 203 lb 1.6 oz (92.1 kg)  05/08/23 204 lb 14.4 oz (92.9 kg)  04/10/23 200 lb 9.6 oz (91 kg)     GENERAL:alert, no distress and comfortable SKIN: skin color, texture, turgor are normal, no rashes  or significant lesions EYES: normal, Conjunctiva are pink and non-injected, sclera clear NECK: supple, thyroid normal size, non-tender, without nodularity LYMPH:  no palpable lymphadenopathy in the cervical, axillary  LUNGS: clear to auscultation and percussion with normal breathing effort HEART: regular rate & rhythm and no murmurs and no lower extremity edema ABDOMEN:abdomen soft, non-tender and normal bowel sounds Musculoskeletal:no cyanosis of digits and no clubbing  NEURO: alert & oriented x 3 with fluent speech, no focal motor/sensory deficits   LABORATORY DATA:  I have reviewed the data as listed    Latest Ref Rng & Units 05/22/2023    9:29 AM 05/08/2023   10:06 AM 04/10/2023    8:48 AM  CBC  WBC 4.0 - 10.5 K/uL 3.2  4.2  3.1   Hemoglobin 12.0 - 15.0 g/dL 21.3  08.6  57.8   Hematocrit 36.0 - 46.0 % 37.3  37.7  35.4   Platelets 150 - 400 K/uL 259  257  239         Latest Ref Rng & Units 05/22/2023    9:29 AM 05/08/2023   10:06 AM 04/10/2023    8:48 AM  CMP  Glucose 70 - 99 mg/dL 469  629  528   BUN 6 - 20 mg/dL 8  12  10    Creatinine 0.44 - 1.00 mg/dL 4.13  2.44  0.10    Sodium 135 - 145 mmol/L 140  140  137   Potassium 3.5 - 5.1 mmol/L 3.2  3.5  3.2   Chloride 98 - 111 mmol/L 105  105  102   CO2 22 - 32 mmol/L 28  28  26    Calcium 8.9 - 10.3 mg/dL 9.3  9.3  9.1   Total Protein 6.5 - 8.1 g/dL 7.0  7.1  7.5   Total Bilirubin 0.0 - 1.2 mg/dL 0.6  1.1  1.1   Alkaline Phos 38 - 126 U/L 129  135  121   AST 15 - 41 U/L 37  39  40   ALT 0 - 44 U/L 30  31  33       RADIOGRAPHIC STUDIES: I have personally reviewed the radiological images as listed and agreed with the findings in the report. No results found.    No orders of the defined types were placed in this encounter.  All questions were answered. The patient knows to call the clinic with any problems, questions or concerns. No barriers to learning was detected. The total time spent in the appointment was 25 minutes.     [name], MD 05/22/2023

## 2023-05-27 ENCOUNTER — Other Ambulatory Visit: Payer: Self-pay

## 2023-05-27 ENCOUNTER — Other Ambulatory Visit: Payer: Self-pay | Admitting: Nurse Practitioner

## 2023-05-27 ENCOUNTER — Other Ambulatory Visit: Payer: Self-pay | Admitting: Hematology

## 2023-05-27 DIAGNOSIS — I10 Essential (primary) hypertension: Secondary | ICD-10-CM

## 2023-05-27 MED ORDER — FAMOTIDINE 20 MG PO TABS
20.0000 mg | ORAL_TABLET | Freq: Two times a day (BID) | ORAL | 2 refills | Status: DC
  Filled 2023-05-27 – 2023-05-28 (×2): qty 60, 30d supply, fill #0
  Filled 2023-06-28: qty 60, 30d supply, fill #1
  Filled 2023-08-07: qty 60, 30d supply, fill #2

## 2023-05-27 MED ORDER — AMLODIPINE BESYLATE 10 MG PO TABS
10.0000 mg | ORAL_TABLET | Freq: Every day | ORAL | 2 refills | Status: DC
  Filled 2023-05-27: qty 30, 30d supply, fill #0
  Filled 2023-06-25: qty 30, 30d supply, fill #1
  Filled 2023-07-25: qty 30, 30d supply, fill #2

## 2023-05-29 ENCOUNTER — Other Ambulatory Visit: Payer: Self-pay

## 2023-06-03 ENCOUNTER — Encounter (HOSPITAL_COMMUNITY)
Admission: RE | Admit: 2023-06-03 | Discharge: 2023-06-03 | Disposition: A | Discharge status: Home / Self Care | Payer: Medicaid Other | Source: Ambulatory Visit | Attending: Hematology | Admitting: Hematology

## 2023-06-03 DIAGNOSIS — C162 Malignant neoplasm of body of stomach: Secondary | ICD-10-CM | POA: Diagnosis present

## 2023-06-03 LAB — GLUCOSE, CAPILLARY: Glucose-Capillary: 106 mg/dL — ABNORMAL HIGH (ref 70–99)

## 2023-06-03 MED ORDER — FLUDEOXYGLUCOSE F - 18 (FDG) INJECTION
10.1000 | Freq: Once | INTRAVENOUS | Status: AC
  Administered 2023-06-03: 10.1 via INTRAVENOUS

## 2023-06-05 ENCOUNTER — Other Ambulatory Visit: Payer: Self-pay

## 2023-06-05 ENCOUNTER — Other Ambulatory Visit: Payer: Self-pay | Admitting: Hematology

## 2023-06-05 DIAGNOSIS — C162 Malignant neoplasm of body of stomach: Secondary | ICD-10-CM

## 2023-06-05 MED ORDER — CAPECITABINE 500 MG PO TABS
ORAL_TABLET | ORAL | 0 refills | Status: DC
  Filled 2023-06-05: qty 84, 21d supply, fill #0

## 2023-06-05 NOTE — Progress Notes (Signed)
 Specialty Pharmacy Ongoing Clinical Assessment Note  Sheryl [name] is a 58 y.o. female who is being followed by the specialty pharmacy service for RxSp Oncology   Patient's specialty medication(s) reviewed today: Capecitabine  (XELODA )   Missed doses in the last 4 weeks: 0   Patient/Caregiver did not have any additional questions or concerns.   Therapeutic benefit summary: Unable to assess   Adverse events/side effects summary: No adverse events/side effects   Patient's therapy is appropriate to: Continue    Goals Addressed             This Visit's Progress    Slow Disease Progression       Patient is on track. Patient will maintain adherence         Follow up:  3 months  Camielle Sizer E Brazil Voytko Specialty Pharmacist

## 2023-06-05 NOTE — Progress Notes (Signed)
 Specialty Pharmacy Refill Coordination Note  Sheryl [name] is a 58 y.o. female contacted today regarding refills of specialty medication(s) Capecitabine  (XELODA )   Patient requested Delivery   Delivery date: 06/14/23   Verified address: [address] [place], [place] [address]   Medication will be filled on 06/13/23. Pending refill request. Patient has OV on 06/12/2023.

## 2023-06-06 ENCOUNTER — Other Ambulatory Visit (HOSPITAL_COMMUNITY): Payer: Self-pay

## 2023-06-06 ENCOUNTER — Other Ambulatory Visit: Payer: Self-pay

## 2023-06-07 ENCOUNTER — Other Ambulatory Visit: Payer: Self-pay

## 2023-06-11 NOTE — Assessment & Plan Note (Signed)
[id number] with peritoneal metastasis. 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   -she started first line chemo FLOT on 1/10 -She understands that chemotherapy is palliative, to prolong her life.  We are unlikely going to cure her cancer. -PD-L1 0-1%, no significant benefit from PD-L1 immunotherapy, FO revealed FGFR2 amplification and fusion (+), FGFR inhibitors can be considered in future, no other targeted therapy available  -She has been tolerating chemo very well, will continue for now  -PET scan from 05/30/2022 was negative for primary tumor or metastatic disease, the known peritoneal mets did not show on PET.  -she has been tolerating chemo well overall. Due to fatigue, I have changed her chemo from FLOT to FOLFOX on 06/20/2022, she tolerated well -she previously asked the role of surgery, depends on her next restaging CT scan findings, I may refer her to [facility] or [facility] to discuss HIPEC surgery  -due to her infusion reaction to oxaliplatin on C5, we added additional premeds and gave slow infusion over 4 hours for cycle 6 and she tolerated well  -She is not able to return to work due to the cancer and treatment related symptoms.  -She is tolerating FOLFOX well overall, with moderate fatigue for a few days after infusion but able to recover well.  No signs of neuropathy at this point. -Restaging CT abdomen pelvis from August 27, 2022 showed no residual disease.  -We again discussed maintenance therapy with Xeloda down the road, we will stop oxaliplatin when she develops side effects especially neuropathy, or after next scan -repeated staging CT from 11/26/2021 showed stable disease  -I have changed her treatment to maintenance Xeloda in early August 2024, she is tolerating well overall  -her NGS Caris showed positive Claudin 18.2, she is a  candidate for zolbetuximab.  -Repeated EGD on March 21, 2023 showed residual gastric cancer.  We discussed option of changing her chemotherapy back to FOLFOX next month and add zolbetuximab. She wants to wait until next PET on 2/3.

## 2023-06-12 ENCOUNTER — Inpatient Hospital Stay: Payer: Medicaid Other | Attending: Physician Assistant

## 2023-06-12 ENCOUNTER — Encounter: Payer: Self-pay | Admitting: Hematology

## 2023-06-12 ENCOUNTER — Other Ambulatory Visit: Payer: Self-pay

## 2023-06-12 ENCOUNTER — Inpatient Hospital Stay (HOSPITAL_BASED_OUTPATIENT_CLINIC_OR_DEPARTMENT_OTHER): Payer: Medicaid Other | Admitting: Hematology

## 2023-06-12 VITALS — BP 113/73 | HR 72 | Temp 98.2°F | Resp 15 | Wt 204.8 lb

## 2023-06-12 DIAGNOSIS — C169 Malignant neoplasm of stomach, unspecified: Secondary | ICD-10-CM | POA: Insufficient documentation

## 2023-06-12 DIAGNOSIS — R1013 Epigastric pain: Secondary | ICD-10-CM | POA: Diagnosis not present

## 2023-06-12 DIAGNOSIS — Z79899 Other long term (current) drug therapy: Secondary | ICD-10-CM | POA: Diagnosis not present

## 2023-06-12 DIAGNOSIS — C786 Secondary malignant neoplasm of retroperitoneum and peritoneum: Secondary | ICD-10-CM | POA: Diagnosis present

## 2023-06-12 DIAGNOSIS — C162 Malignant neoplasm of body of stomach: Secondary | ICD-10-CM | POA: Diagnosis not present

## 2023-06-12 DIAGNOSIS — Z95828 Presence of other vascular implants and grafts: Secondary | ICD-10-CM

## 2023-06-12 LAB — CBC WITH DIFFERENTIAL (CANCER CENTER ONLY)
Abs Immature Granulocytes: 0 10*3/uL (ref 0.00–0.07)
Basophils Absolute: 0 10*3/uL (ref 0.0–0.1)
Basophils Relative: 1 %
Eosinophils Absolute: 0.1 10*3/uL (ref 0.0–0.5)
Eosinophils Relative: 5 %
HCT: 37 % (ref 36.0–46.0)
Hemoglobin: 13.4 g/dL (ref 12.0–15.0)
Immature Granulocytes: 0 %
Lymphocytes Relative: 25 %
Lymphs Abs: 0.7 10*3/uL (ref 0.7–4.0)
MCH: 34.9 pg — ABNORMAL HIGH (ref 26.0–34.0)
MCHC: 36.2 g/dL — ABNORMAL HIGH (ref 30.0–36.0)
MCV: 96.4 fL (ref 80.0–100.0)
Monocytes Absolute: 0.4 10*3/uL (ref 0.1–1.0)
Monocytes Relative: 14 %
Neutro Abs: 1.5 10*3/uL — ABNORMAL LOW (ref 1.7–7.7)
Neutrophils Relative %: 55 %
Platelet Count: 257 10*3/uL (ref 150–400)
RBC: 3.84 MIL/uL — ABNORMAL LOW (ref 3.87–5.11)
RDW: 16 % — ABNORMAL HIGH (ref 11.5–15.5)
WBC Count: 2.8 10*3/uL — ABNORMAL LOW (ref 4.0–10.5)
nRBC: 0 % (ref 0.0–0.2)

## 2023-06-12 LAB — CMP (CANCER CENTER ONLY)
ALT: 27 U/L (ref 0–44)
AST: 37 U/L (ref 15–41)
Albumin: 4.2 g/dL (ref 3.5–5.0)
Alkaline Phosphatase: 119 U/L (ref 38–126)
Anion gap: 5 (ref 5–15)
BUN: 8 mg/dL (ref 6–20)
CO2: 30 mmol/L (ref 22–32)
Calcium: 9.1 mg/dL (ref 8.9–10.3)
Chloride: 105 mmol/L (ref 98–111)
Creatinine: 0.59 mg/dL (ref 0.44–1.00)
GFR, Estimated: >60 mL/min (ref >60)
Glucose, Bld: 109 mg/dL — ABNORMAL HIGH (ref 70–99)
Potassium: 3.3 mmol/L — ABNORMAL LOW (ref 3.5–5.1)
Sodium: 140 mmol/L (ref 135–145)
Total Bilirubin: 0.6 mg/dL (ref 0.0–1.2)
Total Protein: 6.9 g/dL (ref 6.5–8.1)

## 2023-06-12 MED ORDER — HEPARIN SOD (PORK) LOCK FLUSH 100 UNIT/ML IV SOLN
500.0000 [IU] | Freq: Once | INTRAVENOUS | Status: AC
  Administered 2023-06-12: 500 [IU]

## 2023-06-12 MED ORDER — HYDROCODONE-ACETAMINOPHEN 5-325 MG PO TABS
1.0000 | ORAL_TABLET | Freq: Four times a day (QID) | ORAL | 0 refills | Status: DC | PRN
Start: 2023-06-12 — End: 2024-01-01
  Filled 2023-06-12 – 2023-06-25 (×2): qty 20, 5d supply, fill #0

## 2023-06-12 MED ORDER — SODIUM CHLORIDE 0.9% FLUSH
10.0000 mL | Freq: Once | INTRAVENOUS | Status: AC
  Administered 2023-06-12: 10 mL

## 2023-06-12 NOTE — Progress Notes (Signed)
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   -she started first line chemo FLOT on 1/10 -She understands that chemotherapy is palliative, to prolong her life.  We are unlikely going to cure her cancer. -PD-L1 0-1%, no significant benefit from PD-L1 immunotherapy, FO revealed FGFR2 amplification and fusion (+), FGFR inhibitors can be considered in future, no other targeted therapy available  -She has been tolerating chemo very well, will continue for now  -PET scan from 05/30/2022 was negative for primary tumor or metastatic disease, the known peritoneal mets did not show on PET.  -she has been tolerating chemo well overall. Due to fatigue, I have changed her chemo from FLOT to FOLFOX on 06/20/2022, she tolerated well -she previously asked the role of surgery, depends on her next restaging CT scan findings, I may refer her to [facility] or [facility] to discuss HIPEC surgery  -due to her infusion reaction to oxaliplatin on C5, we added additional premeds and gave slow infusion over 4 hours for cycle 6 and she tolerated well  -She is not able to return to work due to the cancer and treatment related symptoms.  -She is tolerating FOLFOX well overall, with moderate fatigue for a few days after infusion but able to recover well.  No signs of neuropathy at this point. -Restaging CT abdomen pelvis from August 27, 2022 showed no residual disease.   -We again discussed maintenance therapy with Xeloda down the road, we will stop oxaliplatin when she develops side effects especially neuropathy, or after next scan -repeated staging CT from 11/26/2021 showed stable disease  -I have changed her treatment to maintenance Xeloda in early August 2024, she is tolerating well overall  -her NGS Caris showed positive Claudin 18.2, she is a candidate for zolbetuximab.  -Repeated EGD on March 21, 2023 showed residual gastric cancer.  We discussed option of changing her chemotherapy back to FOLFOX next month and add zolbetuximab. She wants to wait until next PET on 2/3.     Assessment and Plan    Gastric Cancer 58 year old female with gastric cancer on oral chemotherapy. Reports intermittent stomach pain, significant episode on Sunday relieved by vomiting but recurred at night. PET scan shows no significant growth. Discussed switching to Folfax pluszolbetuximab if pain worsens or disease progresses on next scan. Prefers current chemotherapy and managing pain with nausea medication. Discussed limitations of PET/CT scans and potential need for endoscopy or disease evaluation.  - Continue current oral chemotherapy regimen (3 and 3). - Monitor and document pain episodes, including frequency, duration, and associated food intake. - Consider switching to more aggressive chemotherapy if pain worsens or disease progresses.   Chemotherapy-Induced Nausea Nausea associated with chemotherapy, managed effectively with medication. Prefers taking medication at night due to drowsiness. - Continue current nausea medication as needed, preferably at night.  Pain Management Intermittent pain managed with hydrocodone, especially after physical activity. Pain is infrequent. - Prescribe 20 tablets of hydrocodone, with the option to take half a tablet  if pain is mild. - Monitor for constipation as a side effect of hydrocodone.  Peripheral Neuropathy Reports darkness,  occasional peeling, and intermittent swelling in hands and feet. No significant changes noted. - Monitor symptoms and manage as needed.  Hypokalemia Potassium level slightly low at 3.3. Currently taking one potassium pill daily. - Increase potassium pill to twice daily. - Encourage intake of potassium-rich foods such as bananas, oranges, and nuts.  General Health Maintenance Discussed avoiding overexertion during physical activities due to chemotherapy. No specific physical restrictions but advised to avoid heavy lifting. - Encourage moderate physical activity without overexertion.  Plan -PET scan reviewed, overall stable -Will continue Xeloda, patient will document her pain frequency and duration - Follow up in three weeks. - Bring log book of pain episodes and food intake to the next appointment.         SUMMARY OF ONCOLOGIC HISTORY: Oncology History Overview Note   Cancer Staging  Gastric cancer [facility]) Staging form: Stomach, AJCC 8th Edition - Clinical stage from 04/19/2022: Stage IVB (cT2, cN0, pM1) - Signed by [name], MD on 05/08/2022 Total positive nodes: 0     Gastric cancer (HCC)  03/30/2022 Procedure   EGD:  Impression:  - Normal esophagus. - A few gastric polyps. Biopsied. - Gastritis. Biopsied. - Non-bleeding gastric ulcer with no stigmata of bleeding. Biopsied. - Normal examined duodenum. Biopsied.  Findings: Diffuse moderate inflammation characterized by congestion (edema), friability and granularity was found in the cardia, in the gastric fundus and in the gastric body. There were associated erosions in multiple places. Biopsies were taken from the antrum, body, and fundus with a cold forceps for histology. Estimated blood loss was minimal.  One non-bleeding cratered gastric ulcer with no stigmata of bleeding was found on the greater curvature of the stomach. The lesion was 6 mm in largest dimension. The mucosa around the ulcer was heaped and led to some  deformity in the antrum. Biopsies were taken with a cold forceps for histology. Estimated blood loss was minimal.    03/30/2022 Pathology Results   Patient: CHARLIE, CHAR  Accession: [id number]  Diagnosis 1. Surgical [P], duodenal - BENIGN SMALL BOWEL MUCOSA WITH NO SIGNIFICANT PATHOLOGIC CHANGES 2. Surgical [P], gastric antrum - GASTRIC ANTRAL MUCOSA WITH FEATURES OF REACTIVE GASTROPATHY - NEGATIVE FOR H. PYLORI ON H&E STAIN - NEGATIVE FOR INTESTINAL METAPLASIA OR MALIGNANCY 3. Surgical [P], gastric body - GASTRIC OXYNTIC MUCOSA WITH REACTIVE/REPARATIVE CHANGES - NEGATIVE FOR H. PYLORI ON H&E STAIN - NEGATIVE FOR INTESTINAL METAPLASIA, DYSPLASIA OR MALIGNANCY 4. Surgical [P], greater curve ulceration - ADENOCARCINOMA WITH SIGNET RING CELL FEATURES (SEE NOTE) 5. Surgical [P], gastric polyps - ADENOCARCINOMA WITH SIGNET RING CELL FEATURES (SEE NOTE) 6. Surgical [P], fundus (gastric) - ADENOCARCINOMA WITH SIGNET RING CELL FEATURES (SEE NOTE) 7. Surgical [P], colon, ascending, polyp (1) - TUBULAR ADENOMA. - NO HIGH GRADE DYSPLASIA OR MALIGNANCY. 8. Surgical [P], colon, transverse, polyp (1) - TUBULAR ADENOMA. - NO HIGH GRADE DYSPLASIA OR MALIGNANCY.    04/13/2022 Initial Diagnosis   Gastric cancer (HCC)   04/19/2022 Cancer Staging   Staging form: Stomach, AJCC 8th Edition - Clinical stage from 04/19/2022: Stage IVB (cT2, cN0, pM1) - Signed by [name], MD on 05/08/2022 Total positive nodes: 0   05/05/2022 Genetic Testing   Negative genetic testing on the Multi-cancer gene panel + RNA.  FH c.259C>T VUS identified.  The report date is May 05, 2022.  The Multi-Cancer + RNA Panel offered by [facility] includes sequencing and/or  deletion/duplication analysis of the following 70 genes:  AIP*, ALK, APC*, ATM*, AXIN2*, BAP1*, BARD1*, BLM*, BMPR1A*, BRCA1*, BRCA2*, BRIP1*, CDC73*, CDH1*, CDK4, CDKN1B*, CDKN2A, CHEK2*, CTNNA1*, DICER1*, EPCAM (del/dup only), EGFR, FH*, FLCN*, GREM1  (promoter dup only), HOXB13, KIT, LZTR1, MAX*, MBD4, MEN1*, MET, MITF, MLH1*, MSH2*, MSH3*, MSH6*, MUTYH*, NF1*, NF2*, NTHL1*, PALB2*, PDGFRA, PMS2*, POLD1*, POLE*, POT1*, PRKAR1A*, PTCH1*, PTEN*, RAD51C*, RAD51D*, RB1*, RET, SDHA* (sequencing only), SDHAF2*, SDHB*, SDHC*, SDHD*, SMAD4*, SMARCA4*, SMARCB1*, SMARCE1*, STK11*, SUFU*, TMEM127*, TP53*, TSC1*, TSC2*, VHL*. RNA analysis is performed for * genes.    05/09/2022 - 06/07/2022 Chemotherapy   Patient is on Treatment Plan : GASTROESOPHAGEAL FLOT q14d X 4 cycles      Miscellaneous   Foundation One  Biomarker Findings Microsatellite status- Cannot be determined Tumor Mutational Burden- Cannot be determined  Genomic Findings  FGFR2 amplification,FGFR2-TACC2 fusion,  Rearrangement intron 17 ARAF amplification CCND3 amplification TP53 V252fs*74     05/30/2022 Imaging    IMPRESSION: 1. Mild hypermetabolism corresponding to a dominant left upper quadrant mass and smaller perigastric nodules or nodes. 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 Recommend attention to the dominant left upper quadrant soft tissue mass on follow-up exams to exclude unlikely recurrent lymphoma. 2. No gastric hypermetabolism and no typical findings of metastatic disease.   06/20/2022 -  Chemotherapy   Patient is on Treatment Plan : GASTRIC FOLFOX q14d x 12 cycles     08/27/2022 Imaging    IMPRESSION: No focal gastric mass on CT.   No findings suspicious for recurrent or metastatic disease.   Stable left upper abdominal soft tissue lesion and small lymph nodes, chronic, favoring treated lymphoma.   11/27/2022 Imaging    IMPRESSION: 1. Questionable thickening of the distal esophagus/GE junction and gastric antrum, consider further evaluation with endoscopy. 2. Chronically stable left upper quadrant nodularity and prominent lymph nodes again favored treated lymphoma. Continued attention on follow-up imaging  suggested. 3. No convincing evidence of metastatic disease in the chest, abdomen or pelvis. 4. Questionable asymmetric wall thickening of the rectum, consider further evaluation with colonoscopy. 5. Mild wall thickening of a nondistended urinary bladder, correlate with urinalysis to exclude cystitis. 6. Hepatic steatosis.      Discussed the use of AI scribe software for clinical note transcription with the patient, who gave verbal consent to proceed.  History of Present Illness   The patient is a 58 year old female with a known history of gastric cancer, currently managed with oral chemotherapy. She presents with intermittent stomach pain, which she describes as similar to the discomfort she experienced before her diagnosis. The pain is occasionally accompanied by nausea and vomiting, which she manages with medication. The patient reports that the pain is not constant but comes and goes, with a recent episode lasting an entire day. She also notes that certain foods seem to exacerbate the discomfort, causing a sensation of food getting "stuck" until she drinks something.  In addition to her gastric symptoms, the patient also reports darkening of the skin on her hands and feet, which occasionally swell and peel. She manages this by moisturizing regularly. The patient's weight has remained stable, and she reports no other new symptoms. She has been managing her symptoms with a combination of medications, including an acid reflux medication and a nausea medication, which she takes as needed.         All other systems were reviewed with the patient and are negative.  MEDICAL HISTORY:  Past Medical History:  Diagnosis Date   Blood transfusion without reported diagnosis    had transfusion with hysterectomy   Cataract    Colon polyps 2012   Diabetes (HCC) 03/13/2021   Diabetes (HCC) 05/21/2019   Family history of breast cancer    Family history of pancreatic cancer    Family history of  stomach cancer    Fibroid    gastric ca 03/2022   GERD (gastroesophageal reflux disease)    H/O blood clots    History of hysterectomy    fibroids and heavy cycles   Hypertension     SURGICAL HISTORY: Past Surgical History:  Procedure Laterality Date   ABDOMINAL HYSTERECTOMY     BIOPSY  04/19/2022   Procedure: BIOPSY;  Surgeon: Lemar Lofty., MD;  Location: [facility];  Service: Gastroenterology;;   BIOPSY  03/21/2023   Procedure: BIOPSY;  Surgeon: Lemar Lofty., MD;  Location: [facility];  Service: Gastroenterology;;   COLONOSCOPY     ESOPHAGOGASTRODUODENOSCOPY (EGD) WITH PROPOFOL N/A 04/19/2022   Procedure: ESOPHAGOGASTRODUODENOSCOPY (EGD) WITH PROPOFOL;  Surgeon: Lemar Lofty., MD;  Location: [facility];  Service: Gastroenterology;  Laterality: N/A;   ESOPHAGOGASTRODUODENOSCOPY (EGD) WITH PROPOFOL N/A 03/21/2023   Procedure: ESOPHAGOGASTRODUODENOSCOPY (EGD) WITH PROPOFOL;  Surgeon: Meridee Score Netty Starring., MD;  Location: [facility];  Service: Gastroenterology;  Laterality: N/A;   EUS N/A 04/19/2022   Procedure: UPPER ENDOSCOPIC ULTRASOUND (EUS) RADIAL;  Surgeon: Lemar Lofty., MD;  Location: [facility];  Service: Gastroenterology;  Laterality: N/A;   EXCISION OF SKIN TAG  05/03/2022   Procedure: EXCISION OF CHEST WALL SKIN LESION;  Surgeon: Fritzi Mandes, MD;  Location: [facility];  Service: General;;   LAPAROSCOPY N/A 05/03/2022   Procedure: LAPAROSCOPY DIAGNOSTIC WITH PERITONEAL WASHINGS;  Surgeon: Fritzi Mandes, MD;  Location: [facility];  Service: General;  Laterality: N/A;   POLYPECTOMY  04/19/2022   Procedure: POLYPECTOMY;  Surgeon: Lemar Lofty., MD;  Location: [facility];  Service: Gastroenterology;;   PORTACATH PLACEMENT N/A 05/03/2022   Procedure: INSERTION PORT-A-CATH WITH ULTRASOUND GUIDANCE;  Surgeon: Fritzi Mandes, MD;  Location: [facility];  Service: General;  Laterality: N/A;   UPPER GASTROINTESTINAL ENDOSCOPY       I have reviewed the social history and family history with the patient and they are unchanged from previous note.  ALLERGIES:  is allergic to aspirin, cyclobenzaprine, naproxen sodium, zithromax [azithromycin dihydrate], oxaliplatin, and dilaudid [hydromorphone].  MEDICATIONS:  Current Outpatient Medications  Medication Sig Dispense Refill   acetaminophen (TYLENOL) 500 MG tablet Take 2 tablets (1,000 mg total) by mouth every 8 (eight) hours as needed (pain). 30 tablet 1   amLODipine (NORVASC) 10 MG tablet Take 1 tablet (10 mg total) by mouth daily. 30 tablet 2   b complex vitamins capsule Take 1 capsule by mouth daily.     capecitabine (XELODA) 500 MG tablet Take 3 tabs in morning and 3 tabs in evening. Take within 30 minutes after meals. Take for 14 days on, then off for 7 days. Repeat every 21 days. 96 tablet 0   Cyanocobalamin (VITAMIN B 12 PO) Take by mouth.     famotidine (PEPCID) 20 MG tablet Take 1 tablet (20 mg total) by mouth 2 (two) times daily. 60 tablet 2   HYDROcodone-acetaminophen (NORCO/VICODIN) 5-325 MG tablet Take 1 tablet by mouth every 6 (six) hours as needed for moderate pain (pain score 4-6). 20 tablet 0   lidocaine-prilocaine (EMLA) cream Apply 1 Application topically as needed. 30 g 1  POTASSIUM PO Take 99 mg by mouth daily.     sucralfate (CARAFATE) 1 g tablet Take 1 tablet (1 g total) by mouth 2 (two) times daily. 60 tablet 6   No current facility-administered medications for this visit.    PHYSICAL EXAMINATION: ECOG PERFORMANCE STATUS: 1 - Symptomatic but completely ambulatory  Vitals:   06/12/23 0922  BP: 113/73  Pulse: 72  Resp: 15  Temp: 98.2 F (36.8 C)  SpO2: 100%   Wt Readings from Last 3 Encounters:  06/12/23 204 lb 12.8 oz (92.9 kg)  05/22/23 203 lb 1.6 oz (92.1 kg)  05/08/23 204 lb 14.4 oz (92.9 kg)     GENERAL:alert, no distress and comfortable SKIN: skin color, texture, turgor are normal, no rashes or significant lesions EYES:  normal, Conjunctiva are pink and non-injected, sclera clear NECK: supple, thyroid normal size, non-tender, without nodularity LYMPH:  no palpable lymphadenopathy in the cervical, axillary  LUNGS: clear to auscultation and percussion with normal breathing effort HEART: regular rate & rhythm and no murmurs and no lower extremity edema ABDOMEN:abdomen soft, non-tender and normal bowel sounds Musculoskeletal:no cyanosis of digits and no clubbing  NEURO: alert & oriented x 3 with fluent speech, no focal motor/sensory deficits   LABORATORY DATA:  I have reviewed the data as listed    Latest Ref Rng & Units 06/12/2023    9:01 AM 05/22/2023    9:29 AM 05/08/2023   10:06 AM  CBC  WBC 4.0 - 10.5 K/uL 2.8  3.2  4.2   Hemoglobin 12.0 - 15.0 g/dL 09.8  11.9  14.7   Hematocrit 36.0 - 46.0 % 37.0  37.3  37.7   Platelets 150 - 400 K/uL 257  259  257         Latest Ref Rng & Units 06/12/2023    9:01 AM 05/22/2023    9:29 AM 05/08/2023   10:06 AM  CMP  Glucose 70 - 99 mg/dL 829  562  130   BUN 6 - 20 mg/dL 8  8  12    Creatinine 0.44 - 1.00 mg/dL 8.65  7.84  6.96   Sodium 135 - 145 mmol/L 140  140  140   Potassium 3.5 - 5.1 mmol/L 3.3  3.2  3.5   Chloride 98 - 111 mmol/L 105  105  105   CO2 22 - 32 mmol/L 30  28  28    Calcium 8.9 - 10.3 mg/dL 9.1  9.3  9.3   Total Protein 6.5 - 8.1 g/dL 6.9  7.0  7.1   Total Bilirubin 0.0 - 1.2 mg/dL 0.6  0.6  1.1   Alkaline Phos 38 - 126 U/L 119  129  135   AST 15 - 41 U/L 37  37  39   ALT 0 - 44 U/L 27  30  31        RADIOGRAPHIC STUDIES: I have personally reviewed the radiological images as listed and agreed with the findings in the report. No results found.    No orders of the defined types were placed in this encounter.  All questions were answered. The patient knows to call the clinic with any problems, questions or concerns. No barriers to learning was detected. The total time spent in the appointment was 40 minutes.     [name], MD 06/12/2023

## 2023-06-13 ENCOUNTER — Other Ambulatory Visit: Payer: Self-pay

## 2023-06-19 ENCOUNTER — Encounter: Payer: Self-pay | Admitting: Pulmonary Disease

## 2023-06-21 ENCOUNTER — Other Ambulatory Visit: Payer: Self-pay

## 2023-06-21 ENCOUNTER — Other Ambulatory Visit: Payer: Self-pay | Admitting: Hematology

## 2023-06-21 DIAGNOSIS — C162 Malignant neoplasm of body of stomach: Secondary | ICD-10-CM

## 2023-06-21 MED ORDER — PROCHLORPERAZINE MALEATE 10 MG PO TABS
10.0000 mg | ORAL_TABLET | Freq: Four times a day (QID) | ORAL | 1 refills | Status: DC | PRN
  Filled 2023-06-21 – 2023-06-25 (×2): qty 30, 8d supply, fill #0

## 2023-06-24 ENCOUNTER — Other Ambulatory Visit: Payer: Self-pay

## 2023-06-26 ENCOUNTER — Other Ambulatory Visit: Payer: Self-pay

## 2023-06-27 ENCOUNTER — Other Ambulatory Visit: Payer: Self-pay

## 2023-06-27 ENCOUNTER — Other Ambulatory Visit (HOSPITAL_COMMUNITY): Payer: Self-pay

## 2023-06-27 ENCOUNTER — Other Ambulatory Visit: Payer: Self-pay | Admitting: Hematology

## 2023-06-27 DIAGNOSIS — C162 Malignant neoplasm of body of stomach: Secondary | ICD-10-CM

## 2023-06-27 MED ORDER — CAPECITABINE 500 MG PO TABS
ORAL_TABLET | ORAL | 0 refills | Status: DC
  Filled 2023-06-27: qty 84, 21d supply, fill #0

## 2023-06-27 NOTE — Progress Notes (Addendum)
 Specialty Pharmacy Refill Coordination Note  Patient will be changing therapy. Physician has discontinued Xeloda prescription and fill has been cancelled.  Sheryl [name] is a 58 y.o. female contacted today regarding refills of specialty medication(s) Capecitabine (XELODA)   Patient requested Delivery   Delivery date: 07/04/23   Verified address: [address] [place], [place] [address]   Medication will be filled on 07/03/23, pending refill approval.   This fill date is pending response to refill request from provider. Patient is aware and if they have not received fill by intended date they must follow up with pharmacy.

## 2023-06-28 ENCOUNTER — Other Ambulatory Visit: Payer: Self-pay

## 2023-07-01 NOTE — Assessment & Plan Note (Signed)
 [id number] with peritoneal metastasis. 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   -she started first line chemo FLOT on 1/10 -She understands that chemotherapy is palliative, to prolong her life.  We are unlikely going to cure her cancer. -PD-L1 0-1%, no significant benefit from PD-L1 immunotherapy, FO revealed FGFR2 amplification and fusion (+), FGFR inhibitors can be considered in future, no other targeted therapy available  -She has been tolerating chemo very well, will continue for now  -PET scan from 05/30/2022 was negative for primary tumor or metastatic disease, the known peritoneal mets did not show on PET.  -she has been tolerating chemo well overall. Due to fatigue, I have changed her chemo from FLOT to FOLFOX on 06/20/2022, she tolerated well -she previously asked the role of surgery, depends on her next restaging CT scan findings, I may refer her to [facility] or [facility] to discuss HIPEC surgery  -due to her infusion reaction to oxaliplatin on C5, we added additional premeds and gave slow infusion over 4 hours for cycle 6 and she tolerated well  -She is not able to return to work due to the cancer and treatment related symptoms.  -She is tolerating FOLFOX well overall, with moderate fatigue for a few days after infusion but able to recover well.  No signs of neuropathy at this point. -Restaging CT abdomen pelvis from August 27, 2022 showed no residual disease.  -We again discussed maintenance therapy with Xeloda down the road, we will stop oxaliplatin when she develops side effects especially neuropathy, or after next scan -repeated staging CT from 11/26/2021 showed stable disease  -I have changed her treatment to maintenance Xeloda in early August 2024, she is tolerating well overall  -her NGS Caris showed positive Claudin 18.2, she is a  candidate for zolbetuximab.  -Repeated EGD on March 21, 2023 showed residual gastric cancer.  We discussed option of changing her chemotherapy back to FOLFOX and add zolbetuximab.  -PET 06/03/2023 showed stable disease, patient would like to continue current maintenance Xeloda

## 2023-07-03 ENCOUNTER — Telehealth: Payer: Self-pay

## 2023-07-03 ENCOUNTER — Telehealth: Payer: Self-pay | Admitting: Hematology

## 2023-07-03 ENCOUNTER — Encounter: Payer: Self-pay | Admitting: Hematology

## 2023-07-03 ENCOUNTER — Other Ambulatory Visit (HOSPITAL_COMMUNITY): Payer: Self-pay

## 2023-07-03 ENCOUNTER — Other Ambulatory Visit: Payer: Self-pay

## 2023-07-03 ENCOUNTER — Inpatient Hospital Stay (HOSPITAL_BASED_OUTPATIENT_CLINIC_OR_DEPARTMENT_OTHER): Payer: Medicaid Other | Admitting: Hematology

## 2023-07-03 ENCOUNTER — Inpatient Hospital Stay: Payer: Medicaid Other | Attending: Physician Assistant

## 2023-07-03 VITALS — BP 126/80 | HR 69 | Temp 97.3°F | Resp 21 | Ht 68.0 in | Wt 205.2 lb

## 2023-07-03 DIAGNOSIS — C162 Malignant neoplasm of body of stomach: Secondary | ICD-10-CM

## 2023-07-03 DIAGNOSIS — Z5111 Encounter for antineoplastic chemotherapy: Secondary | ICD-10-CM | POA: Diagnosis present

## 2023-07-03 DIAGNOSIS — T451X5A Adverse effect of antineoplastic and immunosuppressive drugs, initial encounter: Secondary | ICD-10-CM | POA: Diagnosis not present

## 2023-07-03 DIAGNOSIS — Z79899 Other long term (current) drug therapy: Secondary | ICD-10-CM | POA: Insufficient documentation

## 2023-07-03 DIAGNOSIS — C786 Secondary malignant neoplasm of retroperitoneum and peritoneum: Secondary | ICD-10-CM | POA: Insufficient documentation

## 2023-07-03 DIAGNOSIS — Z5112 Encounter for antineoplastic immunotherapy: Secondary | ICD-10-CM | POA: Diagnosis present

## 2023-07-03 DIAGNOSIS — C169 Malignant neoplasm of stomach, unspecified: Secondary | ICD-10-CM | POA: Diagnosis not present

## 2023-07-03 DIAGNOSIS — E860 Dehydration: Secondary | ICD-10-CM | POA: Diagnosis not present

## 2023-07-03 DIAGNOSIS — Z95828 Presence of other vascular implants and grafts: Secondary | ICD-10-CM

## 2023-07-03 LAB — CMP (CANCER CENTER ONLY)
ALT: 27 U/L (ref 0–44)
AST: 33 U/L (ref 15–41)
Albumin: 4.3 g/dL (ref 3.5–5.0)
Alkaline Phosphatase: 128 U/L — ABNORMAL HIGH (ref 38–126)
Anion gap: 6 (ref 5–15)
BUN: 9 mg/dL (ref 6–20)
CO2: 29 mmol/L (ref 22–32)
Calcium: 9.2 mg/dL (ref 8.9–10.3)
Chloride: 105 mmol/L (ref 98–111)
Creatinine: 0.53 mg/dL (ref 0.44–1.00)
GFR, Estimated: >60 mL/min (ref >60)
Glucose, Bld: 114 mg/dL — ABNORMAL HIGH (ref 70–99)
Potassium: 3.5 mmol/L (ref 3.5–5.1)
Sodium: 140 mmol/L (ref 135–145)
Total Bilirubin: 0.8 mg/dL (ref 0.0–1.2)
Total Protein: 7.2 g/dL (ref 6.5–8.1)

## 2023-07-03 LAB — CBC WITH DIFFERENTIAL (CANCER CENTER ONLY)
Abs Immature Granulocytes: 0 10*3/uL (ref 0.00–0.07)
Basophils Absolute: 0 10*3/uL (ref 0.0–0.1)
Basophils Relative: 1 %
Eosinophils Absolute: 0.1 10*3/uL (ref 0.0–0.5)
Eosinophils Relative: 4 %
HCT: 37.7 % (ref 36.0–46.0)
Hemoglobin: 13.7 g/dL (ref 12.0–15.0)
Immature Granulocytes: 0 %
Lymphocytes Relative: 26 %
Lymphs Abs: 0.8 10*3/uL (ref 0.7–4.0)
MCH: 34.9 pg — ABNORMAL HIGH (ref 26.0–34.0)
MCHC: 36.3 g/dL — ABNORMAL HIGH (ref 30.0–36.0)
MCV: 95.9 fL (ref 80.0–100.0)
Monocytes Absolute: 0.4 10*3/uL (ref 0.1–1.0)
Monocytes Relative: 14 %
Neutro Abs: 1.8 10*3/uL (ref 1.7–7.7)
Neutrophils Relative %: 55 %
Platelet Count: 243 10*3/uL (ref 150–400)
RBC: 3.93 MIL/uL (ref 3.87–5.11)
RDW: 15.6 % — ABNORMAL HIGH (ref 11.5–15.5)
WBC Count: 3.2 10*3/uL — ABNORMAL LOW (ref 4.0–10.5)
nRBC: 0 % (ref 0.0–0.2)

## 2023-07-03 MED ORDER — SODIUM CHLORIDE 0.9% FLUSH
10.0000 mL | Freq: Once | INTRAVENOUS | Status: AC
  Administered 2023-07-03: 10 mL

## 2023-07-03 MED ORDER — SCOPOLAMINE 1 MG/3DAYS TD PT72
1.0000 | MEDICATED_PATCH | TRANSDERMAL | 1 refills | Status: AC
  Filled 2023-07-03: qty 10, 30d supply, fill #0
  Filled 2024-04-27: qty 10, 30d supply, fill #1

## 2023-07-03 MED ORDER — HEPARIN SOD (PORK) LOCK FLUSH 100 UNIT/ML IV SOLN
500.0000 [IU] | Freq: Once | INTRAVENOUS | Status: AC
  Administered 2023-07-03: 500 [IU]

## 2023-07-03 NOTE — Progress Notes (Signed)
Disenrolled from Transport planner.

## 2023-07-03 NOTE — Telephone Encounter (Signed)
 Called patient as per Dr. Mosetta Putt to let her know that she called in a scopolamine patch for her to use when she starts her new treatment to help with the nausea. She also wants her to start on Prilosec daily to help with the nausea as well. Patient voiced full understanding and had no further questions.

## 2023-07-03 NOTE — Telephone Encounter (Signed)
 Patient is aware of scheduled appointment times/dates and also aware of scheduled infusion times/dates

## 2023-07-03 NOTE — Progress Notes (Signed)
 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   -she started first line chemo FLOT on 1/10 -She understands that chemotherapy is palliative, to prolong her life.  We are unlikely going to cure her cancer. -PD-L1 0-1%, no significant benefit from PD-L1 immunotherapy, FO revealed FGFR2 amplification and fusion (+), FGFR inhibitors can be considered in future, no other targeted therapy available  -She has been tolerating chemo very well, will continue for now  -PET scan from 05/30/2022 was negative for primary tumor or metastatic disease, the known peritoneal mets did not show on PET.  -she has been tolerating chemo well overall. Due to fatigue, I have changed her chemo from FLOT to FOLFOX on 06/20/2022, she tolerated well -she previously asked the role of surgery, depends on her next restaging CT scan findings, I may refer her to [facility] or [facility] to discuss HIPEC surgery  -due to her infusion reaction to oxaliplatin on C5, we added additional premeds and gave slow infusion over 4 hours for cycle 6 and she tolerated well  -She is not able to return to work due to the cancer and treatment related symptoms.  -She is tolerating FOLFOX well overall, with moderate fatigue for a few days after infusion but able to recover well.  No signs of neuropathy at this point. -Restaging CT abdomen pelvis from August 27, 2022 showed no residual disease.   -We again discussed maintenance therapy with Xeloda down the road, we will stop oxaliplatin when she develops side effects especially neuropathy, or after next scan -repeated staging CT from 11/26/2021 showed stable disease  -I have changed her treatment to maintenance Xeloda in early August 2024, she is tolerating well overall  -her NGS Caris showed positive Claudin 18.2, she is a candidate for zolbetuximab.  -Repeated EGD on March 21, 2023 showed residual gastric cancer.  We discussed option of changing her chemotherapy back to FOLFOX and add zolbetuximab.  -PET 06/03/2023 showed stable disease, patient would like to continue current maintenance Xeloda      Assessment and Plan    Metastatic gastric cancer Symptoms consistent with progression, including dysphagia and regurgitation, suggest tumor growth. Recent endoscopy confirmed residual cancer. Previously responded to FOLFOX chemotherapy but experiencing progression on maintenance Xeloda.  -I recommend change treatment to FOLFOX and zolbetuximab -I discussed the potential side effect of zolbetuximab with patient and her husband in detail, especially nausea and vomiting during the first 2 infusions, and the management.  She will restart a PPI, and I will call in scopolamine patch for her she will start with the first day of treatment.  Premeds including Pepcid IV, dexamethasone, IV Emend, Aloxi and olanzapine.  Since she had oxaliplatin before, will also add Benadryl as premeds to prevent allergy reaction.   -After detailed discussion, patient agrees with the change of treatment.  Will start next week.  C - Monitor for side effects, manage nausea with antiemetics. - Avoid cold foods and drinks during treatment, especially first week. - Consider  paclitaxel and ramucirumab if current regimen is intolerable or disease progresses.  Dysphagia Reports food impaction and regurgitation, likely related to metastatic gastric cancer progression. -  Advise dietary modifications to include soft, easy-to-swallow foods. - Encourage small, frequent meals and consider nutritional supplements like Ensure or Boost.  Nausea Managed with Compazine, which helps relax and reduce muscle tension. - Continue Compazine as needed. - Ensure availability of antiemetics for anticipated nausea with new antibody treatment.  Diabetes management Random blood sugar level is 114, within normal range. A1c not checked this visit. - Monitor blood sugar levels as needed.  Plan -Stop capecitabine, she is off this week. -Change treatment to FOLFOX and zolbetuximab next week -I will call in scopolamine patch     SUMMARY OF ONCOLOGIC HISTORY: Oncology History Overview Note   Cancer Staging  Gastric cancer [facility]) Staging form: Stomach, AJCC 8th Edition - Clinical stage from 04/19/2022: Stage IVB (cT2, cN0, pM1) - Signed by [name], MD on 05/08/2022 Total positive nodes: 0     Gastric cancer (HCC)  03/30/2022 Procedure   EGD:  Impression:  - Normal esophagus. - A few gastric polyps. Biopsied. - Gastritis. Biopsied. - Non-bleeding gastric ulcer with no stigmata of bleeding. Biopsied. - Normal examined duodenum. Biopsied.  Findings: Diffuse moderate inflammation characterized by congestion (edema), friability and granularity was found in the cardia, in the gastric fundus and in the gastric body. There were associated erosions in multiple places. Biopsies were taken from the antrum, body, and fundus with a cold forceps for histology. Estimated blood loss was minimal.  One non-bleeding cratered gastric ulcer with no stigmata of bleeding was found on the greater curvature of the stomach. The lesion was 6 mm in largest dimension. The mucosa around the ulcer was heaped and led to some deformity in the antrum. Biopsies were taken with a cold forceps for histology. Estimated blood loss was minimal.    03/30/2022 Pathology Results   Patient: JENETTA, WEASE  Accession: [id number]  Diagnosis 1. Surgical [P], duodenal - BENIGN SMALL BOWEL MUCOSA WITH NO SIGNIFICANT PATHOLOGIC CHANGES 2. Surgical [P], gastric antrum - GASTRIC ANTRAL MUCOSA WITH FEATURES OF REACTIVE GASTROPATHY - NEGATIVE FOR H. PYLORI ON H&E STAIN - NEGATIVE FOR INTESTINAL METAPLASIA OR MALIGNANCY 3. Surgical [P], gastric body - GASTRIC OXYNTIC MUCOSA WITH REACTIVE/REPARATIVE CHANGES - NEGATIVE FOR H. PYLORI ON H&E STAIN - NEGATIVE FOR INTESTINAL METAPLASIA, DYSPLASIA OR MALIGNANCY 4. Surgical [P], greater curve ulceration - ADENOCARCINOMA WITH SIGNET RING CELL FEATURES (SEE NOTE) 5. Surgical [P], gastric polyps - ADENOCARCINOMA WITH SIGNET RING CELL FEATURES (SEE NOTE) 6. Surgical [P], fundus (gastric) - ADENOCARCINOMA WITH SIGNET RING CELL FEATURES (SEE NOTE) 7. Surgical [P], colon, ascending, polyp (1) - TUBULAR ADENOMA. - NO HIGH GRADE DYSPLASIA OR MALIGNANCY. 8. Surgical [P], colon, transverse, polyp (1) - TUBULAR ADENOMA. - NO HIGH GRADE DYSPLASIA OR MALIGNANCY.    04/13/2022 Initial Diagnosis   Gastric cancer (HCC)   04/19/2022 Cancer Staging   Staging form: Stomach, AJCC 8th Edition - Clinical stage from 04/19/2022: Stage IVB (cT2, cN0, pM1) - Signed by [name], MD on 05/08/2022 Total positive nodes: 0   05/05/2022 Genetic Testing   Negative genetic testing on the Multi-cancer gene panel + RNA.  FH c.259C>T VUS identified.  The report date is May 05, 2022.  The Multi-Cancer + RNA Panel offered by [facility] includes sequencing and/or deletion/duplication analysis of the following 70 genes:  AIP*, ALK, APC*, ATM*, AXIN2*, BAP1*, BARD1*, BLM*, BMPR1A*, BRCA1*, BRCA2*, BRIP1*, CDC73*,  CDH1*, CDK4, CDKN1B*, CDKN2A, CHEK2*, CTNNA1*, DICER1*, EPCAM (del/dup only), EGFR, FH*, FLCN*, GREM1 (promoter dup only), HOXB13, KIT, LZTR1, MAX*, MBD4, MEN1*, MET, MITF, MLH1*, MSH2*, MSH3*, MSH6*, MUTYH*, NF1*, NF2*, NTHL1*, PALB2*, PDGFRA, PMS2*, POLD1*, POLE*, POT1*,  PRKAR1A*, PTCH1*, PTEN*, RAD51C*, RAD51D*, RB1*, RET, SDHA* (sequencing only), SDHAF2*, SDHB*, SDHC*, SDHD*, SMAD4*, SMARCA4*, SMARCB1*, SMARCE1*, STK11*, SUFU*, TMEM127*, TP53*, TSC1*, TSC2*, VHL*. RNA analysis is performed for * genes.    05/09/2022 - 06/07/2022 Chemotherapy   Patient is on Treatment Plan : GASTROESOPHAGEAL FLOT q14d X 4 cycles      Miscellaneous   Foundation One  Biomarker Findings Microsatellite status- Cannot be determined Tumor Mutational Burden- Cannot be determined  Genomic Findings  FGFR2 amplification,FGFR2-TACC2 fusion,  Rearrangement intron 17 ARAF amplification CCND3 amplification TP53 [id number]*74     05/30/2022 Imaging    IMPRESSION: 1. Mild hypermetabolism corresponding to a dominant left upper quadrant mass and smaller perigastric nodules or nodes. 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 Recommend attention to the dominant left upper quadrant soft tissue mass on follow-up exams to exclude unlikely recurrent lymphoma. 2. No gastric hypermetabolism and no typical findings of metastatic disease.   06/20/2022 - 11/16/2022 Chemotherapy   Patient is on Treatment Plan : GASTRIC FOLFOX q14d x 12 cycles     08/27/2022 Imaging    IMPRESSION: No focal gastric mass on CT.   No findings suspicious for recurrent or metastatic disease.   Stable left upper abdominal soft tissue lesion and small lymph nodes, chronic, favoring treated lymphoma.   11/27/2022 Imaging    IMPRESSION: 1. Questionable thickening of the distal esophagus/GE junction and gastric antrum, consider further evaluation with endoscopy. 2. Chronically stable left upper quadrant nodularity and prominent lymph nodes again favored treated lymphoma. Continued attention on follow-up imaging suggested. 3. No convincing evidence of metastatic disease in the chest, abdomen or pelvis. 4. Questionable asymmetric wall thickening of the rectum, consider further  evaluation with colonoscopy. 5. Mild wall thickening of a nondistended urinary bladder, correlate with urinalysis to exclude cystitis. 6. Hepatic steatosis.   07/10/2023 -  Chemotherapy   Patient is on Treatment Plan : GASTROESOPHAGEAL Zolbetuximab (800/400) + FOLFOX D1,15,29 q42d x 4 cycles / Zolbetuximab (400) + 5FU + Leucovorin D1,15,29 q42d        Discussed the use of AI scribe software for clinical note transcription with the patient, who gave verbal consent to proceed.  History of Present Illness   The patient, a 58 year old with a history of metastatic gastric cancer, presents with a recurrence of symptoms that were present before her initial diagnosis. She reports a sensation of food getting stuck, which is alleviated somewhat by standing while eating. She also experiences vomiting, primarily of mucus and phlegm, and hiccups. These symptoms occur sporadically, with the patient noting four instances in the past month. The patient also reports pain, which she manages by bending over until it subsides. She has noticed that certain foods, such as fish, exacerbate her symptoms. The patient has been managing her symptoms with nausea medication, which she reports helps her feel more relaxed.         All other systems were reviewed with the patient and are negative.  MEDICAL HISTORY:  Past Medical History:  Diagnosis Date   Blood transfusion without reported diagnosis    had transfusion with hysterectomy   Cataract    Colon polyps 2012   Diabetes (HCC) 03/13/2021   Diabetes (HCC) 05/21/2019   Family history of  breast cancer    Family history of pancreatic cancer    Family history of stomach cancer    Fibroid    gastric ca 03/2022   GERD (gastroesophageal reflux disease)    H/O blood clots    History of hysterectomy    fibroids and heavy cycles   Hypertension     SURGICAL HISTORY: Past Surgical History:  Procedure Laterality Date   ABDOMINAL HYSTERECTOMY     BIOPSY   04/19/2022   Procedure: BIOPSY;  Surgeon: Lemar Lofty., MD;  Location: [facility];  Service: Gastroenterology;;   BIOPSY  03/21/2023   Procedure: BIOPSY;  Surgeon: Lemar Lofty., MD;  Location: [facility];  Service: Gastroenterology;;   COLONOSCOPY     ESOPHAGOGASTRODUODENOSCOPY (EGD) WITH PROPOFOL N/A 04/19/2022   Procedure: ESOPHAGOGASTRODUODENOSCOPY (EGD) WITH PROPOFOL;  Surgeon: Lemar Lofty., MD;  Location: [facility];  Service: Gastroenterology;  Laterality: N/A;   ESOPHAGOGASTRODUODENOSCOPY (EGD) WITH PROPOFOL N/A 03/21/2023   Procedure: ESOPHAGOGASTRODUODENOSCOPY (EGD) WITH PROPOFOL;  Surgeon: Meridee Score Netty Starring., MD;  Location: [facility];  Service: Gastroenterology;  Laterality: N/A;   EUS N/A 04/19/2022   Procedure: UPPER ENDOSCOPIC ULTRASOUND (EUS) RADIAL;  Surgeon: Lemar Lofty., MD;  Location: [facility];  Service: Gastroenterology;  Laterality: N/A;   EXCISION OF SKIN TAG  05/03/2022   Procedure: EXCISION OF CHEST WALL SKIN LESION;  Surgeon: Fritzi Mandes, MD;  Location: [facility];  Service: General;;   LAPAROSCOPY N/A 05/03/2022   Procedure: LAPAROSCOPY DIAGNOSTIC WITH PERITONEAL WASHINGS;  Surgeon: Fritzi Mandes, MD;  Location: [facility];  Service: General;  Laterality: N/A;   POLYPECTOMY  04/19/2022   Procedure: POLYPECTOMY;  Surgeon: Lemar Lofty., MD;  Location: [facility];  Service: Gastroenterology;;   PORTACATH PLACEMENT N/A 05/03/2022   Procedure: INSERTION PORT-A-CATH WITH ULTRASOUND GUIDANCE;  Surgeon: Fritzi Mandes, MD;  Location: [facility];  Service: General;  Laterality: N/A;   UPPER GASTROINTESTINAL ENDOSCOPY      I have reviewed the social history and family history with the patient and they are unchanged from previous note.  ALLERGIES:  is allergic to aspirin, cyclobenzaprine, naproxen sodium, zithromax [azithromycin dihydrate], oxaliplatin, and dilaudid [hydromorphone].  MEDICATIONS:  Current Outpatient  Medications  Medication Sig Dispense Refill   acetaminophen (TYLENOL) 500 MG tablet Take 2 tablets (1,000 mg total) by mouth every 8 (eight) hours as needed (pain). 30 tablet 1   amLODipine (NORVASC) 10 MG tablet Take 1 tablet (10 mg total) by mouth daily. 30 tablet 2   b complex vitamins capsule Take 1 capsule by mouth daily.     Cyanocobalamin (VITAMIN B 12 PO) Take by mouth.     famotidine (PEPCID) 20 MG tablet Take 1 tablet (20 mg total) by mouth 2 (two) times daily. 60 tablet 2   HYDROcodone-acetaminophen (NORCO/VICODIN) 5-325 MG tablet Take 1 tablet by mouth every 6 (six) hours as needed for moderate pain (pain score 4-6). 20 tablet 0   lidocaine-prilocaine (EMLA) cream Apply 1 Application topically as needed. 30 g 1   POTASSIUM PO Take 99 mg by mouth daily.     prochlorperazine (COMPAZINE) 10 MG tablet Take 1 tablet (10 mg total) by mouth every 6 (six) hours as needed for nausea or vomiting. 30 tablet 1   sucralfate (CARAFATE) 1 g tablet Take 1 tablet (1 g total) by mouth 2 (two) times daily. 60 tablet 6   No current facility-administered medications for this visit.    PHYSICAL EXAMINATION: ECOG PERFORMANCE STATUS: 1 - Symptomatic  but completely ambulatory  Vitals:   07/03/23 0938  BP: 126/80  Pulse: 69  Resp: (!) 21  Temp: (!) 97.3 F (36.3 C)  SpO2: 99%   Wt Readings from Last 3 Encounters:  07/03/23 205 lb 3.2 oz (93.1 kg)  06/12/23 204 lb 12.8 oz (92.9 kg)  05/22/23 203 lb 1.6 oz (92.1 kg)     GENERAL:alert, no distress and comfortable SKIN: skin color, texture, turgor are normal, no rashes or significant lesions EYES: normal, Conjunctiva are pink and non-injected, sclera clear NECK: supple, thyroid normal size, non-tender, without nodularity LYMPH:  no palpable lymphadenopathy in the cervical, axillary  LUNGS: clear to auscultation and percussion with normal breathing effort HEART: regular rate & rhythm and no murmurs and no lower extremity edema ABDOMEN:abdomen  soft, non-tender and normal bowel sounds Musculoskeletal:no cyanosis of digits and no clubbing  NEURO: alert & oriented x 3 with fluent speech, no focal motor/sensory deficits    LABORATORY DATA:  I have reviewed the data as listed    Latest Ref Rng & Units 07/03/2023    9:17 AM 06/12/2023    9:01 AM 05/22/2023    9:29 AM  CBC  WBC 4.0 - 10.5 K/uL 3.2  2.8  3.2   Hemoglobin 12.0 - 15.0 g/dL 29.5  28.4  13.2   Hematocrit 36.0 - 46.0 % 37.7  37.0  37.3   Platelets 150 - 400 K/uL 243  257  259         Latest Ref Rng & Units 07/03/2023    9:17 AM 06/12/2023    9:01 AM 05/22/2023    9:29 AM  CMP  Glucose 70 - 99 mg/dL 440  102  725   BUN 6 - 20 mg/dL 9  8  8    Creatinine 0.44 - 1.00 mg/dL 3.66  4.40  3.47   Sodium 135 - 145 mmol/L 140  140  140   Potassium 3.5 - 5.1 mmol/L 3.5  3.3  3.2   Chloride 98 - 111 mmol/L 105  105  105   CO2 22 - 32 mmol/L 29  30  28    Calcium 8.9 - 10.3 mg/dL 9.2  9.1  9.3   Total Protein 6.5 - 8.1 g/dL 7.2  6.9  7.0   Total Bilirubin 0.0 - 1.2 mg/dL 0.8  0.6  0.6   Alkaline Phos 38 - 126 U/L 128  119  129   AST 15 - 41 U/L 33  37  37   ALT 0 - 44 U/L 27  27  30        RADIOGRAPHIC STUDIES: I have personally reviewed the radiological images as listed and agreed with the findings in the report. No results found.    Orders Placed This Encounter  Procedures   CBC with Differential (Cancer Center Only)    Standing Status:   Future    Expected Date:   07/10/2023    Expiration Date:   07/09/2024   CMP (Cancer Center only)    Standing Status:   Future    Expected Date:   07/10/2023    Expiration Date:   07/09/2024   CBC with Differential (Cancer Center Only)    Standing Status:   Future    Expected Date:   07/24/2023    Expiration Date:   07/23/2024   CMP (Cancer Center only)    Standing Status:   Future    Expected Date:   07/24/2023    Expiration Date:  07/23/2024   CBC with Differential (Cancer Center Only)    Standing Status:   Future    Expected  Date:   08/07/2023    Expiration Date:   08/06/2024   CMP (Cancer Center only)    Standing Status:   Future    Expected Date:   08/07/2023    Expiration Date:   08/06/2024   CBC with Differential (Cancer Center Only)    Standing Status:   Future    Expected Date:   08/21/2023    Expiration Date:   08/20/2024   CMP (Cancer Center only)    Standing Status:   Future    Expected Date:   08/21/2023    Expiration Date:   08/20/2024   CBC with Differential (Cancer Center Only)    Standing Status:   Future    Expected Date:   09/04/2023    Expiration Date:   09/03/2024   CMP (Cancer Center only)    Standing Status:   Future    Expected Date:   09/04/2023    Expiration Date:   09/03/2024   CBC with Differential (Cancer Center Only)    Standing Status:   Future    Expected Date:   09/18/2023    Expiration Date:   09/17/2024   CMP (Cancer Center only)    Standing Status:   Future    Expected Date:   09/18/2023    Expiration Date:   09/17/2024   All questions were answered. The patient knows to call the clinic with any problems, questions or concerns. No barriers to learning was detected. The total time spent in the appointment was 40 minutes.     [name], MD 07/03/2023

## 2023-07-04 ENCOUNTER — Other Ambulatory Visit: Payer: Self-pay

## 2023-07-05 ENCOUNTER — Other Ambulatory Visit: Payer: Self-pay

## 2023-07-05 ENCOUNTER — Encounter: Payer: Self-pay | Admitting: Hematology

## 2023-07-08 ENCOUNTER — Encounter: Payer: Self-pay | Admitting: Hematology

## 2023-07-08 ENCOUNTER — Other Ambulatory Visit: Payer: Self-pay

## 2023-07-08 MED FILL — Fosaprepitant Dimeglumine For IV Infusion 150 MG (Base Eq): INTRAVENOUS | Qty: 5 | Status: AC

## 2023-07-08 NOTE — Progress Notes (Signed)
 Pharmacist Chemotherapy Monitoring - Initial Assessment    Anticipated start date: 07/10/23   The following has been reviewed per standard work regarding the patient's treatment regimen: The patient's diagnosis, treatment plan and drug doses, and organ/hematologic function Lab orders and baseline tests specific to treatment regimen  The treatment plan start date, drug sequencing, and pre-medications Prior authorization status  Patient's documented medication list, including drug-drug interaction screen and prescriptions for anti-emetics and supportive care specific to the treatment regimen The drug concentrations, fluid compatibility, administration routes, and timing of the medications to be used The patient's access for treatment and lifetime cumulative dose history, if applicable  The patient's medication allergies and previous infusion related reactions, if applicable   Changes made to treatment plan:  Cycle 1 split (Day 1: Zolbetuximab; Day 2: FOLFOX given h/o IRR w/ Oxaliplatin; Oxaliplatin infuses over 4 hours.  Follow up needed:  Pt seeing Dr. Mosetta Putt 3/11 prior to starting tx on 3/12.  Take home Rx's not yet released (Zyprexa, Zofran, Dex). Confirm pump d/c date corrected to 07/13/23.   Ebony Hail, Pharm.D., CPP 07/08/2023@2 :39 PM

## 2023-07-08 NOTE — Assessment & Plan Note (Signed)
 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   -she started first line chemo FLOT on 1/10 -She understands that chemotherapy is palliative, to prolong her life.  We are unlikely going to cure her cancer. -PD-L1 0-1%, no significant benefit from PD-L1 immunotherapy, FO revealed FGFR2 amplification and fusion (+), FGFR inhibitors can be considered in future, no other targeted therapy available  -She has been tolerating chemo very well, will continue for now  -PET scan from 05/30/2022 was negative for primary tumor or metastatic disease, the known peritoneal mets did not show on PET.  -she has been tolerating chemo well overall. Due to fatigue, I have changed her chemo from FLOT to FOLFOX on 06/20/2022, she tolerated well -she previously asked the role of surgery, depends on her next restaging CT scan findings, I may refer her to [facility] or [facility] to discuss HIPEC surgery  -due to her infusion reaction to oxaliplatin on C5, we added additional premeds and gave slow infusion over 4 hours for cycle 6 and she tolerated well  -She is not able to return to work due to the cancer and treatment related symptoms.  -She is tolerating FOLFOX well overall, with moderate fatigue for a few days after infusion but able to recover well.  No signs of neuropathy at this point. -Restaging CT abdomen pelvis from August 27, 2022 showed no residual disease.  -We again discussed maintenance therapy with Xeloda down the road, we will stop oxaliplatin when she develops side effects especially neuropathy, or after next scan -repeated staging CT from 11/26/2021 showed stable disease  -I have changed her treatment to maintenance Xeloda in early August 2024, she is tolerating well overall  -her NGS Caris showed positive Claudin 18.2, she is a  candidate for zolbetuximab.  -Repeated EGD on March 21, 2023 showed residual gastric cancer.  We discussed option of changing her chemotherapy back to FOLFOX and add zolbetuximab.  -PET 06/03/2023 showed stable disease -Patient developed recurrent abdominal pain, similar to the symptoms she had when she was diagnosed.  She agreed to change treatment back to FOLFOX, and add Zolbetuximab

## 2023-07-09 ENCOUNTER — Encounter: Payer: Self-pay | Admitting: Hematology

## 2023-07-09 ENCOUNTER — Other Ambulatory Visit: Payer: Self-pay

## 2023-07-09 ENCOUNTER — Inpatient Hospital Stay

## 2023-07-09 ENCOUNTER — Inpatient Hospital Stay (HOSPITAL_BASED_OUTPATIENT_CLINIC_OR_DEPARTMENT_OTHER): Admitting: Hematology

## 2023-07-09 VITALS — BP 148/58 | HR 72 | Temp 97.3°F | Resp 18 | Ht 68.0 in | Wt 207.4 lb

## 2023-07-09 DIAGNOSIS — C162 Malignant neoplasm of body of stomach: Secondary | ICD-10-CM

## 2023-07-09 DIAGNOSIS — Z5112 Encounter for antineoplastic immunotherapy: Secondary | ICD-10-CM | POA: Diagnosis not present

## 2023-07-09 DIAGNOSIS — Z95828 Presence of other vascular implants and grafts: Secondary | ICD-10-CM

## 2023-07-09 LAB — CBC WITH DIFFERENTIAL (CANCER CENTER ONLY)
Abs Immature Granulocytes: 0.01 10*3/uL (ref 0.00–0.07)
Basophils Absolute: 0 10*3/uL (ref 0.0–0.1)
Basophils Relative: 1 %
Eosinophils Absolute: 0.1 10*3/uL (ref 0.0–0.5)
Eosinophils Relative: 3 %
HCT: 37.8 % (ref 36.0–46.0)
Hemoglobin: 13.6 g/dL (ref 12.0–15.0)
Immature Granulocytes: 0 %
Lymphocytes Relative: 27 %
Lymphs Abs: 1 10*3/uL (ref 0.7–4.0)
MCH: 34.3 pg — ABNORMAL HIGH (ref 26.0–34.0)
MCHC: 36 g/dL (ref 30.0–36.0)
MCV: 95.5 fL (ref 80.0–100.0)
Monocytes Absolute: 0.5 10*3/uL (ref 0.1–1.0)
Monocytes Relative: 12 %
Neutro Abs: 2.1 10*3/uL (ref 1.7–7.7)
Neutrophils Relative %: 57 %
Platelet Count: 246 10*3/uL (ref 150–400)
RBC: 3.96 MIL/uL (ref 3.87–5.11)
RDW: 15.4 % (ref 11.5–15.5)
WBC Count: 3.8 10*3/uL — ABNORMAL LOW (ref 4.0–10.5)
nRBC: 0 % (ref 0.0–0.2)

## 2023-07-09 LAB — CMP (CANCER CENTER ONLY)
ALT: 28 U/L (ref 0–44)
AST: 37 U/L (ref 15–41)
Albumin: 4.4 g/dL (ref 3.5–5.0)
Alkaline Phosphatase: 120 U/L (ref 38–126)
Anion gap: 6 (ref 5–15)
BUN: 10 mg/dL (ref 6–20)
CO2: 28 mmol/L (ref 22–32)
Calcium: 9.2 mg/dL (ref 8.9–10.3)
Chloride: 105 mmol/L (ref 98–111)
Creatinine: 0.59 mg/dL (ref 0.44–1.00)
GFR, Estimated: >60 mL/min (ref >60)
Glucose, Bld: 113 mg/dL — ABNORMAL HIGH (ref 70–99)
Potassium: 3.4 mmol/L — ABNORMAL LOW (ref 3.5–5.1)
Sodium: 139 mmol/L (ref 135–145)
Total Bilirubin: 1.1 mg/dL (ref 0.0–1.2)
Total Protein: 7.1 g/dL (ref 6.5–8.1)

## 2023-07-09 MED ORDER — HEPARIN SOD (PORK) LOCK FLUSH 100 UNIT/ML IV SOLN
500.0000 [IU] | Freq: Once | INTRAVENOUS | Status: AC
Start: 2023-07-09 — End: 2023-07-09
  Administered 2023-07-09: 500 [IU]

## 2023-07-09 MED ORDER — LIDOCAINE-PRILOCAINE 2.5-2.5 % EX CREA
1.0000 | TOPICAL_CREAM | CUTANEOUS | 1 refills | Status: DC | PRN
  Filled 2023-07-09: qty 30, 30d supply, fill #0

## 2023-07-09 MED ORDER — SODIUM CHLORIDE 0.9% FLUSH
10.0000 mL | Freq: Once | INTRAVENOUS | Status: AC
  Administered 2023-07-09: 10 mL

## 2023-07-09 MED FILL — Fosaprepitant Dimeglumine For IV Infusion 150 MG (Base Eq): INTRAVENOUS | Qty: 5 | Status: AC

## 2023-07-09 NOTE — Progress Notes (Signed)
 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   -she started first line chemo FLOT on 1/10 -She understands that chemotherapy is palliative, to prolong her life.  We are unlikely going to cure her cancer. -PD-L1 0-1%, no significant benefit from PD-L1 immunotherapy, FO revealed FGFR2 amplification and fusion (+), FGFR inhibitors can be considered in future, no other targeted therapy available  -She has been tolerating chemo very well, will continue for now  -PET scan from 05/30/2022 was negative for primary tumor or metastatic disease, the known peritoneal mets did not show on PET.  -she has been tolerating chemo well overall. Due to fatigue, I have changed her chemo from FLOT to FOLFOX on 06/20/2022, she tolerated well -she previously asked the role of surgery, depends on her next restaging CT scan findings, I may refer her to [facility] or [facility] to discuss HIPEC surgery  -due to her infusion reaction to oxaliplatin on C5, we added additional premeds and gave slow infusion over 4 hours for cycle 6 and she tolerated well  -She is not able to return to work due to the cancer and treatment related symptoms.  -She is tolerating FOLFOX well overall, with moderate fatigue for a few days after infusion but able to recover well.  No signs of neuropathy at this point. -Restaging CT abdomen pelvis from August 27, 2022 showed no residual disease.  -We again discussed maintenance therapy with Xeloda down the road, we will stop oxaliplatin when she develops side effects especially neuropathy, or after next scan -repeated staging CT from 11/26/2021 showed stable disease  -I have changed her treatment to maintenance Xeloda in early August 2024, she is tolerating well overall  -her NGS Caris showed positive Claudin 18.2, she is a candidate for zolbetuximab.  -Repeated EGD on March 21, 2023 showed residual gastric cancer.  We discussed option of changing her chemotherapy back to FOLFOX and add zolbetuximab.  -PET 06/03/2023 showed stable disease -Patient developed recurrent abdominal pain, similar to the symptoms she had when she was diagnosed.  She agreed to change treatment back to FOLFOX, and add Zolbetuximab     Assessment and Plan    Gastric cancer Undergoing treatment with chemotherapy (oxaliplatin) and antibody therapy. Treatment is divided over multiple days due to infusion length and potential side effects, including nausea and vomiting. Previously experienced a mild reaction to oxaliplatin, requiring a slower infusion rate. The treatment plan is ongoing, with adjustments based on tolerance and response. The goal is to manage cancer while minimizing side effects and maintaining quality of life. Hair thinning is possible, but complete hair loss is unlikely. Compression socks and gloves are optional for neuropathy prevention. Treatment frequency is biweekly, with potential adjustments for personal events like her sister's wedding. - Administer antibody therapy on Wednesday with an 8-hour infusion to monitor for reactions. - Administer chemotherapy with FOLFOX on Thursday with a 6-hour infusion due to previous mild reaction. - Schedule pump disconnection  on Saturday at 2 PM. - Consider reducing oxaliplatin dose if neuropathy or other side effects become problematic. - Discuss potential for hair thinning  but not complete hair loss with current regimen. - Advise wearing compression socks and gloves if motivated, but not required. - Allow for a week off treatment for sister's wedding if desired, with resumption the following week. - Schedule future treatments on Wednesdays with pump disconnection on Fridays if initial cycles are well-tolerated. - Monitor kidney and liver function tests. - Provide numbing cream prescription. - Ensure availability of nausea medications (ondansetron and prochlorperazine) and refill if necessary. - Schedule a phone visit next week to assess tolerance after the first cycle.  Leukopenia Mild leukopenia with a white blood cell count of 3.8, improved from 3.2. Neutrophil and red blood cell counts are normal. Leukopenia is monitored as part of ongoing cancer treatment. - Monitor white blood cell count in subsequent visits.     Plan -I reviewed her chemo schedule, potential side effect and management with her and her family in detail -She will start first cycle will be Tuesday Mab tomorrow, and FOLFOX the day after tomorrow -Phone visit next week for toxicity checkup. -I reviewed Compazine for her    SUMMARY OF ONCOLOGIC HISTORY: Oncology History Overview Note   Cancer Staging  Gastric cancer [facility]) Staging form: Stomach, AJCC 8th Edition - Clinical stage from 04/19/2022: Stage IVB (cT2, cN0, pM1) - Signed by [name], MD on 05/08/2022 Total positive nodes: 0     Gastric cancer (HCC)  03/30/2022 Procedure   EGD:  Impression:  - Normal esophagus. - A few gastric polyps. Biopsied. - Gastritis. Biopsied. - Non-bleeding gastric ulcer with no stigmata of bleeding. Biopsied. - Normal examined duodenum. Biopsied.  Findings: Diffuse moderate inflammation characterized by congestion (edema), friability and granularity was found in the cardia, in the gastric fundus and in the gastric body. There were associated erosions in multiple places. Biopsies were taken from  the antrum, body, and fundus with a cold forceps for histology. Estimated blood loss was minimal.  One non-bleeding cratered gastric ulcer with no stigmata of bleeding was found on the greater curvature of the stomach. The lesion was 6 mm in largest dimension. The mucosa around the ulcer was heaped and led to some deformity in the antrum. Biopsies were taken with a cold forceps for histology. Estimated blood loss was minimal.    03/30/2022 Pathology Results   Patient: ILINE, BUCHINGER  Accession: [id number]  Diagnosis 1. Surgical [P], duodenal - BENIGN SMALL BOWEL MUCOSA WITH NO SIGNIFICANT PATHOLOGIC CHANGES 2. Surgical [P], gastric antrum - GASTRIC ANTRAL MUCOSA WITH FEATURES OF REACTIVE GASTROPATHY - NEGATIVE FOR H. PYLORI ON H&E STAIN - NEGATIVE FOR INTESTINAL METAPLASIA OR MALIGNANCY 3. Surgical [P], gastric body - GASTRIC OXYNTIC MUCOSA WITH REACTIVE/REPARATIVE CHANGES - NEGATIVE FOR H. PYLORI ON H&E STAIN - NEGATIVE FOR INTESTINAL METAPLASIA, DYSPLASIA OR MALIGNANCY 4. Surgical [P], greater curve ulceration - ADENOCARCINOMA WITH SIGNET RING CELL FEATURES (SEE NOTE) 5. Surgical [P], gastric polyps - ADENOCARCINOMA WITH SIGNET RING CELL FEATURES (SEE NOTE) 6. Surgical [P], fundus (gastric) - ADENOCARCINOMA WITH SIGNET RING CELL FEATURES (SEE NOTE) 7. Surgical [P], colon, ascending, polyp (1) - TUBULAR ADENOMA. - NO HIGH GRADE DYSPLASIA OR MALIGNANCY. 8. Surgical [P], colon, transverse, polyp (1) - TUBULAR ADENOMA. - NO HIGH GRADE DYSPLASIA OR MALIGNANCY.    04/13/2022 Initial Diagnosis   Gastric cancer (HCC)   04/19/2022 Cancer Staging   Staging form: Stomach, AJCC 8th Edition - Clinical  stage from 04/19/2022: Stage IVB (cT2, cN0, pM1) - Signed by [name], MD on 05/08/2022 Total positive nodes: 0   05/05/2022 Genetic Testing   Negative genetic testing on the Multi-cancer gene panel + RNA.  FH c.259C>T VUS identified.  The report date is May 05, 2022.  The  Multi-Cancer + RNA Panel offered by [facility] includes sequencing and/or deletion/duplication analysis of the following 70 genes:  AIP*, ALK, APC*, ATM*, AXIN2*, BAP1*, BARD1*, BLM*, BMPR1A*, BRCA1*, BRCA2*, BRIP1*, CDC73*, CDH1*, CDK4, CDKN1B*, CDKN2A, CHEK2*, CTNNA1*, DICER1*, EPCAM (del/dup only), EGFR, FH*, FLCN*, GREM1 (promoter dup only), HOXB13, KIT, LZTR1, MAX*, MBD4, MEN1*, MET, MITF, MLH1*, MSH2*, MSH3*, MSH6*, MUTYH*, NF1*, NF2*, NTHL1*, PALB2*, PDGFRA, PMS2*, POLD1*, POLE*, POT1*, PRKAR1A*, PTCH1*, PTEN*, RAD51C*, RAD51D*, RB1*, RET, SDHA* (sequencing only), SDHAF2*, SDHB*, SDHC*, SDHD*, SMAD4*, SMARCA4*, SMARCB1*, SMARCE1*, STK11*, SUFU*, TMEM127*, TP53*, TSC1*, TSC2*, VHL*. RNA analysis is performed for * genes.    05/09/2022 - 06/07/2022 Chemotherapy   Patient is on Treatment Plan : GASTROESOPHAGEAL FLOT q14d X 4 cycles      Miscellaneous   Foundation One  Biomarker Findings Microsatellite status- Cannot be determined Tumor Mutational Burden- Cannot be determined  Genomic Findings  FGFR2 amplification,FGFR2-TACC2 fusion,  Rearrangement intron 17 ARAF amplification CCND3 amplification TP53 V256fs*74     05/30/2022 Imaging    IMPRESSION: 1. Mild hypermetabolism corresponding to a dominant left upper quadrant mass and smaller perigastric nodules or nodes. 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 Recommend attention to the dominant left upper quadrant soft tissue mass on follow-up exams to exclude unlikely recurrent lymphoma. 2. No gastric hypermetabolism and no typical findings of metastatic disease.   06/20/2022 - 11/16/2022 Chemotherapy   Patient is on Treatment Plan : GASTRIC FOLFOX q14d x 12 cycles     08/27/2022 Imaging    IMPRESSION: No focal gastric mass on CT.   No findings suspicious for recurrent or metastatic disease.   Stable left upper abdominal soft tissue lesion and small lymph nodes, chronic, favoring treated  lymphoma.   11/27/2022 Imaging    IMPRESSION: 1. Questionable thickening of the distal esophagus/GE junction and gastric antrum, consider further evaluation with endoscopy. 2. Chronically stable left upper quadrant nodularity and prominent lymph nodes again favored treated lymphoma. Continued attention on follow-up imaging suggested. 3. No convincing evidence of metastatic disease in the chest, abdomen or pelvis. 4. Questionable asymmetric wall thickening of the rectum, consider further evaluation with colonoscopy. 5. Mild wall thickening of a nondistended urinary bladder, correlate with urinalysis to exclude cystitis. 6. Hepatic steatosis.   07/10/2023 -  Chemotherapy   Patient is on Treatment Plan : GASTROESOPHAGEAL Zolbetuximab (800/400) + FOLFOX D1,15,29 q42d x 4 cycles / Zolbetuximab (400) + 5FU + Leucovorin D1,15,29 q42d        Discussed the use of AI scribe software for clinical note transcription with the patient, who gave verbal consent to proceed.  History of Present Illness   The patient, a 58 year old female with a history of gastric cancer, presents for a follow-up visit. She is scheduled to start a new treatment regimen involving an antibody and chemotherapy. The patient expresses concerns about the treatment schedule, potential side effects, and impacts on her daily life. She is particularly worried about potential hair loss, nausea, and the need for compression socks. She also mentions a previous allergic reaction to a medication and her experience with fatigue during a previous treatment. The patient is also concerned about the impact of the treatment schedule  on her personal life, including a family wedding. She requests a refill of her numbing cream for her port.         All other systems were reviewed with the patient and are negative.  MEDICAL HISTORY:  Past Medical History:  Diagnosis Date   Blood transfusion without reported diagnosis    had transfusion with  hysterectomy   Cataract    Colon polyps 2012   Diabetes (HCC) 03/13/2021   Diabetes (HCC) 05/21/2019   Family history of breast cancer    Family history of pancreatic cancer    Family history of stomach cancer    Fibroid    gastric ca 03/2022   GERD (gastroesophageal reflux disease)    H/O blood clots    History of hysterectomy    fibroids and heavy cycles   Hypertension     SURGICAL HISTORY: Past Surgical History:  Procedure Laterality Date   ABDOMINAL HYSTERECTOMY     BIOPSY  04/19/2022   Procedure: BIOPSY;  Surgeon: Lemar Lofty., MD;  Location: [facility];  Service: Gastroenterology;;   BIOPSY  03/21/2023   Procedure: BIOPSY;  Surgeon: Lemar Lofty., MD;  Location: [facility];  Service: Gastroenterology;;   COLONOSCOPY     ESOPHAGOGASTRODUODENOSCOPY (EGD) WITH PROPOFOL N/A 04/19/2022   Procedure: ESOPHAGOGASTRODUODENOSCOPY (EGD) WITH PROPOFOL;  Surgeon: Lemar Lofty., MD;  Location: [facility];  Service: Gastroenterology;  Laterality: N/A;   ESOPHAGOGASTRODUODENOSCOPY (EGD) WITH PROPOFOL N/A 03/21/2023   Procedure: ESOPHAGOGASTRODUODENOSCOPY (EGD) WITH PROPOFOL;  Surgeon: Meridee Score Netty Starring., MD;  Location: [facility];  Service: Gastroenterology;  Laterality: N/A;   EUS N/A 04/19/2022   Procedure: UPPER ENDOSCOPIC ULTRASOUND (EUS) RADIAL;  Surgeon: Lemar Lofty., MD;  Location: [facility];  Service: Gastroenterology;  Laterality: N/A;   EXCISION OF SKIN TAG  05/03/2022   Procedure: EXCISION OF CHEST WALL SKIN LESION;  Surgeon: Fritzi Mandes, MD;  Location: [facility];  Service: General;;   LAPAROSCOPY N/A 05/03/2022   Procedure: LAPAROSCOPY DIAGNOSTIC WITH PERITONEAL WASHINGS;  Surgeon: Fritzi Mandes, MD;  Location: [facility];  Service: General;  Laterality: N/A;   POLYPECTOMY  04/19/2022   Procedure: POLYPECTOMY;  Surgeon: Lemar Lofty., MD;  Location: [facility];  Service: Gastroenterology;;   PORTACATH PLACEMENT N/A  05/03/2022   Procedure: INSERTION PORT-A-CATH WITH ULTRASOUND GUIDANCE;  Surgeon: Fritzi Mandes, MD;  Location: [facility];  Service: General;  Laterality: N/A;   UPPER GASTROINTESTINAL ENDOSCOPY      I have reviewed the social history and family history with the patient and they are unchanged from previous note.  ALLERGIES:  is allergic to aspirin, cyclobenzaprine, naproxen sodium, zithromax [azithromycin dihydrate], oxaliplatin, and dilaudid [hydromorphone].  MEDICATIONS:  Current Outpatient Medications  Medication Sig Dispense Refill   acetaminophen (TYLENOL) 500 MG tablet Take 2 tablets (1,000 mg total) by mouth every 8 (eight) hours as needed (pain). 30 tablet 1   amLODipine (NORVASC) 10 MG tablet Take 1 tablet (10 mg total) by mouth daily. 30 tablet 2   b complex vitamins capsule Take 1 capsule by mouth daily.     Cyanocobalamin (VITAMIN B 12 PO) Take by mouth.     famotidine (PEPCID) 20 MG tablet Take 1 tablet (20 mg total) by mouth 2 (two) times daily. 60 tablet 2   HYDROcodone-acetaminophen (NORCO/VICODIN) 5-325 MG tablet Take 1 tablet by mouth every 6 (six) hours as needed for moderate pain (pain score 4-6). 20 tablet 0   lidocaine-prilocaine (EMLA) cream Apply 1 Application topically as  needed. 30 g 1   POTASSIUM PO Take 99 mg by mouth daily.     prochlorperazine (COMPAZINE) 10 MG tablet Take 1 tablet (10 mg total) by mouth every 6 (six) hours as needed for nausea or vomiting. 30 tablet 1   scopolamine (TRANSDERM-SCOP) 1 MG/3DAYS Place 1 patch (1.5 mg total) onto the skin every 3 (three) days. 10 patch 1   sucralfate (CARAFATE) 1 g tablet Take 1 tablet (1 g total) by mouth 2 (two) times daily. 60 tablet 6   No current facility-administered medications for this visit.    PHYSICAL EXAMINATION: ECOG PERFORMANCE STATUS: 1 - Symptomatic but completely ambulatory  Vitals:   07/09/23 0931  BP: (!) 148/58  Pulse: 72  Resp: 18  Temp: (!) 97.3 F (36.3 C)  SpO2: 99%   Wt Readings  from Last 3 Encounters:  07/09/23 207 lb 6.4 oz (94.1 kg)  07/03/23 205 lb 3.2 oz (93.1 kg)  06/12/23 204 lb 12.8 oz (92.9 kg)     GENERAL:alert, no distress and comfortable SKIN: skin color, texture, turgor are normal, no rashes or significant lesions EYES: normal, Conjunctiva are pink and non-injected, sclera clear NECK: supple, thyroid normal size, non-tender, without nodularity LYMPH:  no palpable lymphadenopathy in the cervical, axillary  LUNGS: clear to auscultation and percussion with normal breathing effort HEART: regular rate & rhythm and no murmurs and no lower extremity edema ABDOMEN:abdomen soft, non-tender and normal bowel sounds Musculoskeletal:no cyanosis of digits and no clubbing  NEURO: alert & oriented x 3 with fluent speech, no focal motor/sensory deficits     LABORATORY DATA:  I have reviewed the data as listed    Latest Ref Rng & Units 07/09/2023    9:12 AM 07/03/2023    9:17 AM 06/12/2023    9:01 AM  CBC  WBC 4.0 - 10.5 K/uL 3.8  3.2  2.8   Hemoglobin 12.0 - 15.0 g/dL 65.7  84.6  96.2   Hematocrit 36.0 - 46.0 % 37.8  37.7  37.0   Platelets 150 - 400 K/uL 246  243  257         Latest Ref Rng & Units 07/09/2023    9:12 AM 07/03/2023    9:17 AM 06/12/2023    9:01 AM  CMP  Glucose 70 - 99 mg/dL 952  841  324   BUN 6 - 20 mg/dL 10  9  8    Creatinine 0.44 - 1.00 mg/dL 4.01  0.27  2.53   Sodium 135 - 145 mmol/L 139  140  140   Potassium 3.5 - 5.1 mmol/L 3.4  3.5  3.3   Chloride 98 - 111 mmol/L 105  105  105   CO2 22 - 32 mmol/L 28  29  30    Calcium 8.9 - 10.3 mg/dL 9.2  9.2  9.1   Total Protein 6.5 - 8.1 g/dL 7.1  7.2  6.9   Total Bilirubin 0.0 - 1.2 mg/dL 1.1  0.8  0.6   Alkaline Phos 38 - 126 U/L 120  128  119   AST 15 - 41 U/L 37  33  37   ALT 0 - 44 U/L 28  27  27        RADIOGRAPHIC STUDIES: I have personally reviewed the radiological images as listed and agreed with the findings in the report. No results found.    Orders Placed This Encounter   Procedures   CBC with Differential (Cancer Center Only)    Standing Status:  Future    Expected Date:   10/02/2023    Expiration Date:   10/01/2024   CMP (Cancer Center only)    Standing Status:   Future    Expected Date:   10/02/2023    Expiration Date:   10/01/2024   CBC with Differential (Cancer Center Only)    Standing Status:   Future    Expected Date:   10/16/2023    Expiration Date:   10/15/2024   CMP (Cancer Center only)    Standing Status:   Future    Expected Date:   10/16/2023    Expiration Date:   10/15/2024   CBC with Differential (Cancer Center Only)    Standing Status:   Future    Expected Date:   10/30/2023    Expiration Date:   10/29/2024   CMP (Cancer Center only)    Standing Status:   Future    Expected Date:   10/30/2023    Expiration Date:   10/29/2024   All questions were answered. The patient knows to call the clinic with any problems, questions or concerns. No barriers to learning was detected. The total time spent in the appointment was 25 minutes.     [name], MD 07/09/2023

## 2023-07-10 ENCOUNTER — Inpatient Hospital Stay

## 2023-07-10 ENCOUNTER — Ambulatory Visit: Admitting: Nurse Practitioner

## 2023-07-10 ENCOUNTER — Encounter: Payer: Self-pay | Admitting: Hematology

## 2023-07-10 ENCOUNTER — Other Ambulatory Visit

## 2023-07-10 ENCOUNTER — Other Ambulatory Visit: Payer: Self-pay

## 2023-07-10 ENCOUNTER — Inpatient Hospital Stay (HOSPITAL_BASED_OUTPATIENT_CLINIC_OR_DEPARTMENT_OTHER): Admitting: Physician Assistant

## 2023-07-10 VITALS — BP 129/83 | HR 110 | Temp 98.7°F | Resp 16

## 2023-07-10 VITALS — BP 141/87 | HR 110 | Temp 98.8°F | Resp 17

## 2023-07-10 DIAGNOSIS — T50905A Adverse effect of unspecified drugs, medicaments and biological substances, initial encounter: Secondary | ICD-10-CM

## 2023-07-10 DIAGNOSIS — C162 Malignant neoplasm of body of stomach: Secondary | ICD-10-CM

## 2023-07-10 DIAGNOSIS — R112 Nausea with vomiting, unspecified: Secondary | ICD-10-CM | POA: Diagnosis not present

## 2023-07-10 DIAGNOSIS — Z5112 Encounter for antineoplastic immunotherapy: Secondary | ICD-10-CM | POA: Diagnosis not present

## 2023-07-10 MED ORDER — FAMOTIDINE IN NACL 20-0.9 MG/50ML-% IV SOLN
20.0000 mg | Freq: Once | INTRAVENOUS | Status: AC
  Administered 2023-07-10: 20 mg via INTRAVENOUS
  Filled 2023-07-10: qty 50

## 2023-07-10 MED ORDER — SODIUM CHLORIDE 0.9 % IV SOLN
150.0000 mg | Freq: Once | INTRAVENOUS | Status: AC
  Administered 2023-07-10: 150 mg via INTRAVENOUS
  Filled 2023-07-10: qty 150
  Filled 2023-07-10: qty 5

## 2023-07-10 MED ORDER — DIPHENHYDRAMINE HCL 25 MG PO CAPS
25.0000 mg | ORAL_CAPSULE | Freq: Once | ORAL | Status: AC
  Administered 2023-07-10: 25 mg via ORAL
  Filled 2023-07-10: qty 1

## 2023-07-10 MED ORDER — OLANZAPINE 5 MG PO TABS
5.0000 mg | ORAL_TABLET | Freq: Once | ORAL | Status: AC
  Administered 2023-07-10: 5 mg via ORAL
  Filled 2023-07-10: qty 1

## 2023-07-10 MED ORDER — SODIUM CHLORIDE 0.9 % IV SOLN
1700.0000 mg | Freq: Once | INTRAVENOUS | Status: AC
  Administered 2023-07-10: 1700 mg via INTRAVENOUS
  Filled 2023-07-10: qty 85

## 2023-07-10 MED ORDER — DEXAMETHASONE SODIUM PHOSPHATE 10 MG/ML IJ SOLN
10.0000 mg | Freq: Once | INTRAMUSCULAR | Status: AC
  Administered 2023-07-10: 10 mg via INTRAVENOUS
  Filled 2023-07-10: qty 1

## 2023-07-10 MED ORDER — PROCHLORPERAZINE EDISYLATE 10 MG/2ML IJ SOLN
10.0000 mg | Freq: Once | INTRAMUSCULAR | Status: AC
  Administered 2023-07-10: 10 mg via INTRAVENOUS

## 2023-07-10 MED ORDER — SODIUM CHLORIDE 0.9 % IV SOLN: INTRAVENOUS | Status: DC

## 2023-07-10 MED ORDER — PALONOSETRON HCL INJECTION 0.25 MG/5ML
0.2500 mg | Freq: Once | INTRAVENOUS | Status: AC
  Administered 2023-07-10: 0.25 mg via INTRAVENOUS
  Filled 2023-07-10: qty 5

## 2023-07-10 NOTE — Progress Notes (Signed)
 Pt observed for 120 minutes post Vyloy infusion. VSS at discharge, patient denies any nausea/vomiting. Ambulatory to lobby.

## 2023-07-10 NOTE — Patient Instructions (Signed)
 [facility] - A DEPT OF [facility]  Discharge Instructions: Thank you for choosing [facility] to provide your oncology and hematology care.   If you have a lab appointment with the Cancer Center, please go directly to the Cancer Center and check in at the registration area.   Wear comfortable clothing and clothing appropriate for easy access to any Portacath or PICC line.   We strive to give you quality time with your provider. You may need to reschedule your appointment if you arrive late (15 or more minutes).  Arriving late affects you and other patients whose appointments are after yours.  Also, if you miss three or more appointments without notifying the office, you may be dismissed from the clinic at the provider's discretion.      For prescription refill requests, have your pharmacy contact our office and allow 72 hours for refills to be completed.    Today you received the following chemotherapy and/or immunotherapy agents zolbetuximab (Vyloy)      To help prevent nausea and vomiting after your treatment, we encourage you to take your nausea medication as directed.  BELOW ARE SYMPTOMS THAT SHOULD BE REPORTED IMMEDIATELY: *FEVER GREATER THAN 100.4 F (38 C) OR HIGHER *CHILLS OR SWEATING *NAUSEA AND VOMITING THAT IS NOT CONTROLLED WITH YOUR NAUSEA MEDICATION *UNUSUAL SHORTNESS OF BREATH *UNUSUAL BRUISING OR BLEEDING *URINARY PROBLEMS (pain or burning when urinating, or frequent urination) *BOWEL PROBLEMS (unusual diarrhea, constipation, pain near the anus) TENDERNESS IN MOUTH AND THROAT WITH OR WITHOUT PRESENCE OF ULCERS (sore throat, sores in mouth, or a toothache) UNUSUAL RASH, SWELLING OR PAIN  UNUSUAL VAGINAL DISCHARGE OR ITCHING   Items with * indicate a potential emergency and should be followed up as soon as possible or go to the Emergency Department if any problems should occur.  Please show the CHEMOTHERAPY ALERT CARD or  IMMUNOTHERAPY ALERT CARD at check-in to the Emergency Department and triage nurse.  Should you have questions after your visit or need to cancel or reschedule your appointment, please contact [facility] - A DEPT OF [facility]  Dept: [phone]  and follow the prompts.  Office hours are 8:00 a.m. to 4:30 p.m. Monday - Friday. Please note that voicemails left after 4:00 p.m. may not be returned until the following business day.  We are closed weekends and major holidays. You have access to a nurse at all times for urgent questions. Please call the main number to the clinic Dept: [phone] and follow the prompts.   For any non-urgent questions, you may also contact your provider using MyChart. We now offer e-Visits for anyone 30 and older to request care online for non-urgent symptoms. For details visit [email or web address].   Also download the MyChart app! Go to the app store, search "MyChart", open the app, select [place], and log in with your MyChart username and password.

## 2023-07-11 ENCOUNTER — Inpatient Hospital Stay

## 2023-07-11 ENCOUNTER — Encounter: Payer: Self-pay | Admitting: Hematology

## 2023-07-11 ENCOUNTER — Ambulatory Visit

## 2023-07-11 VITALS — BP 157/93 | HR 100 | Temp 98.7°F | Resp 16

## 2023-07-11 DIAGNOSIS — Z5112 Encounter for antineoplastic immunotherapy: Secondary | ICD-10-CM | POA: Diagnosis not present

## 2023-07-11 DIAGNOSIS — C162 Malignant neoplasm of body of stomach: Secondary | ICD-10-CM

## 2023-07-11 MED ORDER — FAMOTIDINE IN NACL 20-0.9 MG/50ML-% IV SOLN
20.0000 mg | Freq: Once | INTRAVENOUS | Status: AC
Start: 2023-07-11 — End: 2023-07-11
  Administered 2023-07-11: 20 mg via INTRAVENOUS
  Filled 2023-07-11: qty 50

## 2023-07-11 MED ORDER — SODIUM CHLORIDE 0.9 % IV SOLN
2400.0000 mg/m2 | INTRAVENOUS | Status: DC
  Administered 2023-07-11: 5000 mg via INTRAVENOUS
  Filled 2023-07-11: qty 100

## 2023-07-11 MED ORDER — LEUCOVORIN CALCIUM INJECTION 350 MG
400.0000 mg/m2 | Freq: Once | INTRAVENOUS | Status: AC
  Administered 2023-07-11: 844 mg via INTRAVENOUS
  Filled 2023-07-11: qty 17.5

## 2023-07-11 MED ORDER — DEXTROSE 5 % IV SOLN: INTRAVENOUS | Status: DC

## 2023-07-11 MED ORDER — OXALIPLATIN CHEMO INJECTION 100 MG/20ML
60.0000 mg/m2 | Freq: Once | INTRAVENOUS | Status: AC
  Administered 2023-07-11: 125 mg via INTRAVENOUS
  Filled 2023-07-11: qty 20

## 2023-07-11 MED ORDER — DEXAMETHASONE SODIUM PHOSPHATE 10 MG/ML IJ SOLN
10.0000 mg | Freq: Once | INTRAMUSCULAR | Status: AC
  Administered 2023-07-11: 10 mg via INTRAVENOUS
  Filled 2023-07-11: qty 1

## 2023-07-11 MED ORDER — DIPHENHYDRAMINE HCL 25 MG PO CAPS
25.0000 mg | ORAL_CAPSULE | Freq: Once | ORAL | Status: AC
  Administered 2023-07-11: 25 mg via ORAL
  Filled 2023-07-11: qty 1

## 2023-07-11 MED ORDER — SODIUM CHLORIDE 0.9% FLUSH
10.0000 mL | INTRAVENOUS | Status: DC | PRN
Start: 2023-07-11 — End: 2023-07-11
  Administered 2023-07-11: 10 mL

## 2023-07-11 NOTE — Patient Instructions (Signed)
 [facility] - A DEPT OF [facility]  Discharge Instructions: Thank you for choosing [facility] to provide your oncology and hematology care.   If you have a lab appointment with the Cancer Center, please go directly to the Cancer Center and check in at the registration area.   Wear comfortable clothing and clothing appropriate for easy access to any Portacath or PICC line.   We strive to give you quality time with your provider. You may need to reschedule your appointment if you arrive late (15 or more minutes).  Arriving late affects you and other patients whose appointments are after yours.  Also, if you miss three or more appointments without notifying the office, you may be dismissed from the clinic at the provider's discretion.      For prescription refill requests, have your pharmacy contact our office and allow 72 hours for refills to be completed.    Today you received the following chemotherapy and/or immunotherapy agents: Oxaliplatin/Leucovorin/Fluorouracil      To help prevent nausea and vomiting after your treatment, we encourage you to take your nausea medication as directed.  BELOW ARE SYMPTOMS THAT SHOULD BE REPORTED IMMEDIATELY: *FEVER GREATER THAN 100.4 F (38 C) OR HIGHER *CHILLS OR SWEATING *NAUSEA AND VOMITING THAT IS NOT CONTROLLED WITH YOUR NAUSEA MEDICATION *UNUSUAL SHORTNESS OF BREATH *UNUSUAL BRUISING OR BLEEDING *URINARY PROBLEMS (pain or burning when urinating, or frequent urination) *BOWEL PROBLEMS (unusual diarrhea, constipation, pain near the anus) TENDERNESS IN MOUTH AND THROAT WITH OR WITHOUT PRESENCE OF ULCERS (sore throat, sores in mouth, or a toothache) UNUSUAL RASH, SWELLING OR PAIN  UNUSUAL VAGINAL DISCHARGE OR ITCHING   Items with * indicate a potential emergency and should be followed up as soon as possible or go to the Emergency Department if any problems should occur.  Please show the CHEMOTHERAPY  ALERT CARD or IMMUNOTHERAPY ALERT CARD at check-in to the Emergency Department and triage nurse.  Should you have questions after your visit or need to cancel or reschedule your appointment, please contact [facility] - A DEPT OF [facility]  Dept: [phone]  and follow the prompts.  Office hours are 8:00 a.m. to 4:30 p.m. Monday - Friday. Please note that voicemails left after 4:00 p.m. may not be returned until the following business day.  We are closed weekends and major holidays. You have access to a nurse at all times for urgent questions. Please call the main number to the clinic Dept: [phone] and follow the prompts.   For any non-urgent questions, you may also contact your provider using MyChart. We now offer e-Visits for anyone 59 and older to request care online for non-urgent symptoms. For details visit [email or web address].   Also download the MyChart app! Go to the app store, search "MyChart", open the app, select [facility], and log in with your MyChart username and password.  The chemotherapy medication bag should finish at 46 hours, 96 hours, or 7 days. For example, if your pump is scheduled for 46 hours and it was put on at 4:00 p.m., it should finish at 2:00 p.m. the day it is scheduled to come off regardless of your appointment time.     Estimated time to finish at ?????.   If the display on your pump reads "Low Volume" and it is beeping, take the batteries out of the pump and come to the cancer center for it to  be taken off.   If the pump alarms go off prior to the pump reading "Low Volume" then call [phone] and someone can assist you.  If the plunger comes out and the chemotherapy medication is leaking out, please use your home chemo spill kit to clean up the spill. Do NOT use paper towels or other household products.  If you have problems or questions regarding your pump, please call either [phone] (24 hours a day) or  the cancer center Monday-Friday 8:00 a.m.- 4:30 p.m. at the clinic number and we will assist you. If you are unable to get assistance, then go to the nearest Emergency Department and ask the staff to contact the IV team for assistance.

## 2023-07-11 NOTE — Progress Notes (Signed)
    DATE:  07/11/23                                        X CHEMO/IMMUNOTHERAPY REACTION           MD: Mosetta Putt   AGENT/BLOOD PRODUCT RECEIVING TODAY:              Zolbetuximab    AGENT/BLOOD PRODUCT RECEIVING IMMEDIATELY PRIOR TO REACTION:          Zolbetuximab     Vitals:   07/10/23 1233  BP: 129/83  Pulse: (!) 110  Resp: 16  Temp: 98.7 F (37.1 C)  TempSrc: Oral  SpO2: 97%      REACTION(S):           nausea and vomiting   PREMEDS:     benadryl PO 25 mg, Pepcid IVPB 20 mg, Emend IVPB 150 mg, Dexamethasone IV 10 mg, Aloxi, & Zyprexa   INTERVENTION: Compazine 10 mg IV   Review of Systems  Review of Systems  Gastrointestinal:  Positive for nausea and vomiting.  All other systems reviewed and are negative.    Physical Exam  Physical Exam Vitals and nursing note reviewed.  Constitutional:      Appearance: She is not ill-appearing or toxic-appearing.  HENT:     Head: Normocephalic.     Comments: Wearing scopolamine patch Eyes:     Conjunctiva/sclera: Conjunctivae normal.  Cardiovascular:     Rate and Rhythm: Regular rhythm. Tachycardia present.     Pulses: Normal pulses.     Heart sounds: Normal heart sounds.  Pulmonary:     Effort: Pulmonary effort is normal.     Breath sounds: Normal breath sounds.  Abdominal:     General: There is no distension.     Tenderness: There is no abdominal tenderness.  Musculoskeletal:     Cervical back: Normal range of motion.  Skin:    General: Skin is warm and dry.  Neurological:     Mental Status: She is alert.     OUTCOME:                  Patient became symptomatic 1 hour and 15 minutes into first time Zolbetuximab infusion.Treatment was paused for 30 minutes and nausea resolved. VSS. Treatment resumed at slower rate. Patient then experienced nausea and vomiting. Compazine was administered. Symptoms resolved and treatment was able to be restarted. Patient tolerated treatment well overall and received 75% of infusion due  to time expiration time. Patient observed for 2 hours post infusion. As patient only experienced nausea and vomiting this is an infusion-related reaction only.  Dr. Mosetta Putt evaluated patient at the bedside as well and will add compazine to pre medications for future treatments. Patient will return tomorrow for FOLFOX treatment.    I have spent a total of 20 minutes minutes of face-to-face and non-face-to-face time preparing to see the patient, performing a medically appropriate examination, counseling and educating the patient, ordering medications, documenting clinical information in the electronic health record, and care coordination.

## 2023-07-12 ENCOUNTER — Inpatient Hospital Stay

## 2023-07-12 ENCOUNTER — Telehealth: Payer: Self-pay

## 2023-07-12 ENCOUNTER — Other Ambulatory Visit: Payer: Self-pay

## 2023-07-12 NOTE — Telephone Encounter (Signed)
 Pt called stating that he had fevers over night with the highest temp at 102.9'F.  Pt stated the fevers have been only at night.  Pt stated she's taking Acetaminophen 500mg  which did lower her fever.  Pt stated she took 2 doses overnight.  Pt received Zolbetuximab on Wednesday, 07/10/2023.  Pt denied skin rash, n/v, pain, itching, diarrhea, lower back pain, and dysuria.  Had pt to take a Flu/Covid home test which was negative for both flu & Covid.  Pt stated she's fatigue but not enough where she cannot do her daily ADLs.  Pt denied taking Diphenhydramine or Claritin.  Stated that the fevers are most likely related to the Zolbetuximab since it's an immunotherapy/target therapy medication.  Explained that most immunotheapy/target therapy medications can cause elevated temperatures.  Stated to please contact Dr. Latanya Maudlin office if the fevers continues or the pt starts to develop new symptoms. Pt verbalized understanding and had no further questions or concerns.  Notified Dr. Mosetta Putt and her team of the pt's call.

## 2023-07-13 ENCOUNTER — Inpatient Hospital Stay

## 2023-07-13 VITALS — BP 140/95 | HR 93 | Temp 97.7°F | Resp 17

## 2023-07-13 DIAGNOSIS — C162 Malignant neoplasm of body of stomach: Secondary | ICD-10-CM

## 2023-07-13 DIAGNOSIS — Z95828 Presence of other vascular implants and grafts: Secondary | ICD-10-CM

## 2023-07-13 DIAGNOSIS — Z5112 Encounter for antineoplastic immunotherapy: Secondary | ICD-10-CM | POA: Diagnosis not present

## 2023-07-13 MED ORDER — HEPARIN SOD (PORK) LOCK FLUSH 100 UNIT/ML IV SOLN
250.0000 [IU] | Freq: Once | INTRAVENOUS | Status: AC
  Administered 2023-07-13: 250 [IU]

## 2023-07-13 MED ORDER — SODIUM CHLORIDE 0.9% FLUSH
10.0000 mL | Freq: Once | INTRAVENOUS | Status: AC
  Administered 2023-07-13: 10 mL

## 2023-07-16 ENCOUNTER — Encounter: Payer: Self-pay | Admitting: Hematology

## 2023-07-17 ENCOUNTER — Emergency Department (HOSPITAL_COMMUNITY)
Admission: EM | Admit: 2023-07-17 | Discharge: 2023-07-18 | Disposition: A | Discharge status: Home / Self Care | Attending: Emergency Medicine | Admitting: Emergency Medicine

## 2023-07-17 ENCOUNTER — Other Ambulatory Visit: Payer: Self-pay

## 2023-07-17 ENCOUNTER — Encounter (HOSPITAL_COMMUNITY): Payer: Self-pay | Admitting: Emergency Medicine

## 2023-07-17 DIAGNOSIS — R112 Nausea with vomiting, unspecified: Secondary | ICD-10-CM | POA: Diagnosis present

## 2023-07-17 DIAGNOSIS — E119 Type 2 diabetes mellitus without complications: Secondary | ICD-10-CM | POA: Insufficient documentation

## 2023-07-17 DIAGNOSIS — I10 Essential (primary) hypertension: Secondary | ICD-10-CM | POA: Diagnosis not present

## 2023-07-17 DIAGNOSIS — E876 Hypokalemia: Secondary | ICD-10-CM | POA: Insufficient documentation

## 2023-07-17 DIAGNOSIS — Z79899 Other long term (current) drug therapy: Secondary | ICD-10-CM | POA: Diagnosis not present

## 2023-07-17 DIAGNOSIS — C162 Malignant neoplasm of body of stomach: Secondary | ICD-10-CM

## 2023-07-17 MED ORDER — SODIUM CHLORIDE 0.9 % IV BOLUS
1000.0000 mL | Freq: Once | INTRAVENOUS | Status: AC
  Administered 2023-07-17: 1000 mL via INTRAVENOUS

## 2023-07-17 MED ORDER — METOCLOPRAMIDE HCL 5 MG/ML IJ SOLN
10.0000 mg | INTRAMUSCULAR | Status: AC
  Administered 2023-07-17: 10 mg via INTRAVENOUS
  Filled 2023-07-17: qty 2

## 2023-07-17 MED ORDER — LIDOCAINE-PRILOCAINE 2.5-2.5 % EX CREA
TOPICAL_CREAM | Freq: Once | CUTANEOUS | Status: DC
  Filled 2023-07-17: qty 5

## 2023-07-17 NOTE — Progress Notes (Signed)
 Referral for second opinion was faxed to [facility] (p [phone]) [phone]). Fax confirmation was received. This referral is per family request and agreed upon by Dr. Mosetta Putt.

## 2023-07-17 NOTE — ED Triage Notes (Signed)
 Patient c/o vomiting x 1 week after starting new chemo.

## 2023-07-17 NOTE — ED Provider Notes (Signed)
 [place] EMERGENCY DEPARTMENT AT [facility] Provider Note   CSN: [id number] Arrival date & time: 07/17/23  2201     History {Add pertinent medical, surgical, social history, OB history to HPI:1} Chief Complaint  Patient presents with   Emesis    Sheryl [name] is a 58 y.o. female.  The history is provided by the patient and medical records.  Emesis  58 year old female with history of gastric cancer currently undergoing chemo, GERD, hyperlipidemia, hypertension, diabetes, presenting to the ED with nausea and vomiting.  Patient started new chemotherapy 07/10/2023-- Zolbetuximab.  During infusion she had to stop several times due to nausea and vomiting, did have some relief with scopolamine.  Reports since being home she has continued to have intermittent nausea and vomiting.  Today has been a little worse for some reason.  She was able to hold down some watermelon and chicken noodle soup for a few hours and began vomiting heavily before bed.  She has not had any bloody emesis.  Denies any diarrhea.  No fever or chills.  No significant abdominal pain.  Home Medications Prior to Admission medications   Medication Sig Start Date End Date Taking? Authorizing Provider  acetaminophen (TYLENOL) 500 MG tablet Take 2 tablets (1,000 mg total) by mouth every 8 (eight) hours as needed (pain). 06/11/22   [name], MD  amLODipine (NORVASC) 10 MG tablet Take 1 tablet (10 mg total) by mouth daily. 05/27/23   Carlean Jews, NP  b complex vitamins capsule Take 1 capsule by mouth daily.    [provider]  Cyanocobalamin (VITAMIN B 12 PO) Take by mouth.    [provider]  famotidine (PEPCID) 20 MG tablet Take 1 tablet (20 mg total) by mouth 2 (two) times daily. 05/27/23   Carlean Jews, NP  HYDROcodone-acetaminophen (NORCO/VICODIN) 5-325 MG tablet Take 1 tablet by mouth every 6 (six) hours as needed for moderate pain (pain score 4-6). 06/12/23   [name], MD   lidocaine-prilocaine (EMLA) cream Apply 1 Application topically as needed. 07/09/23   [name], MD  POTASSIUM PO Take 99 mg by mouth daily.    [provider]  prochlorperazine (COMPAZINE) 10 MG tablet Take 1 tablet (10 mg total) by mouth every 6 (six) hours as needed for nausea or vomiting. 06/21/23   Carlean Jews, NP  scopolamine (TRANSDERM-SCOP) 1 MG/3DAYS Place 1 patch (1.5 mg total) onto the skin every 3 (three) days. 07/03/23   [name], MD  sucralfate (CARAFATE) 1 g tablet Take 1 tablet (1 g total) by mouth 2 (two) times daily. 03/21/23   Mansouraty, Netty Starring., MD      Allergies    Aspirin, Cyclobenzaprine, Naproxen sodium, Zithromax [azithromycin dihydrate], Oxaliplatin, and Dilaudid [hydromorphone]    Review of Systems   Review of Systems  Gastrointestinal:  Positive for vomiting.    Physical Exam Updated Vital Signs BP (!) 128/93 (BP Location: Left Arm)   Pulse (!) 106   Temp 99 F (37.2 C) (Oral)   Resp 20   Wt 95 kg   LMP 06/29/2010   SpO2 98%   BMI 31.84 kg/m  Physical Exam Vitals and nursing note reviewed.  Constitutional:      Appearance: She is well-developed.  HENT:     Head: Normocephalic and atraumatic.     Comments: Scopolamine patch behind left ear Eyes:     Conjunctiva/sclera: Conjunctivae normal.     Pupils: Pupils are equal, round, and reactive to  light.  Cardiovascular:     Rate and Rhythm: Normal rate and regular rhythm.     Heart sounds: Normal heart sounds.  Pulmonary:     Effort: Pulmonary effort is normal.     Breath sounds: Normal breath sounds.  Abdominal:     General: Bowel sounds are normal.     Palpations: Abdomen is soft.  Musculoskeletal:        General: Normal range of motion.     Cervical back: Normal range of motion.  Skin:    General: Skin is warm and dry.  Neurological:     Mental Status: She is alert and oriented to person, place, and time.     ED Results / Procedures / Treatments   Labs (all labs  ordered are listed, but only abnormal results are displayed) Labs Reviewed  CBC WITH DIFFERENTIAL/PLATELET  COMPREHENSIVE METABOLIC PANEL  LIPASE, BLOOD  MAGNESIUM    EKG None  Radiology No results found.  Procedures Procedures  {Document cardiac monitor, telemetry assessment procedure when appropriate:1}  Medications Ordered in ED Medications  sodium chloride 0.9 % bolus 1,000 mL (has no administration in time range)  metoCLOPramide (REGLAN) injection 10 mg (has no administration in time range)  lidocaine-prilocaine (EMLA) cream (has no administration in time range)    ED Course/ Medical Decision Making/ A&P   {   Click here for ABCD2, HEART and other calculatorsREFRESH Note before signing :1}                              Medical Decision Making Amount and/or Complexity of Data Reviewed Labs: ordered.  Risk Prescription drug management.   ***  {Document critical care time when appropriate:1} {Document review of labs and clinical decision tools ie heart score, Chads2Vasc2 etc:1}  {Document your independent review of radiology images, and any outside records:1} {Document your discussion with family members, caretakers, and with consultants:1} {Document social determinants of health affecting pt's care:1} {Document your decision making why or why not admission, treatments were needed:1} Final Clinical Impression(s) / ED Diagnoses Final diagnoses:  None    Rx / DC Orders ED Discharge Orders     None

## 2023-07-17 NOTE — Assessment & Plan Note (Signed)
 [id number] with peritoneal metastasis. 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   -she started first line chemo FLOT on 1/10 -She understands that chemotherapy is palliative, to prolong her life.  We are unlikely going to cure her cancer. -PD-L1 0-1%, no significant benefit from PD-L1 immunotherapy, FO revealed FGFR2 amplification and fusion (+), FGFR inhibitors can be considered in future, no other targeted therapy available  -She has been tolerating chemo very well, will continue for now  -PET scan from 05/30/2022 was negative for primary tumor or metastatic disease, the known peritoneal mets did not show on PET.  -she has been tolerating chemo well overall. Due to fatigue, I have changed her chemo from FLOT to FOLFOX on 06/20/2022, she tolerated well -she previously asked the role of surgery, depends on her next restaging CT scan findings, I may refer her to [facility] or [facility] to discuss HIPEC surgery  -due to her infusion reaction to oxaliplatin on C5, we added additional premeds and gave slow infusion over 4 hours for cycle 6 and she tolerated well  -She is not able to return to work due to the cancer and treatment related symptoms.  -She is tolerating FOLFOX well overall, with moderate fatigue for a few days after infusion but able to recover well.  No signs of neuropathy at this point. -Restaging CT abdomen pelvis from August 27, 2022 showed no residual disease.  -We again discussed maintenance therapy with Xeloda down the road, we will stop oxaliplatin when she develops side effects especially neuropathy, or after next scan -repeated staging CT from 11/26/2021 showed stable disease  -I have changed her treatment to maintenance Xeloda in early August 2024, she is tolerating well overall  -her NGS Caris showed positive Claudin 18.2, she is a  candidate for zolbetuximab.  -Repeated EGD on March 21, 2023 showed residual gastric cancer.  We discussed option of changing her chemotherapy back to FOLFOX and add zolbetuximab.  -PET 06/03/2023 showed stable disease -Patient developed recurrent abdominal pain, similar to the symptoms she had when she was diagnosed.  She agreed to change treatment back to FOLFOX, and add Zolbetuximab. She started on 07/09/2023

## 2023-07-18 ENCOUNTER — Other Ambulatory Visit: Payer: Self-pay

## 2023-07-18 ENCOUNTER — Encounter: Payer: Self-pay | Admitting: Hematology

## 2023-07-18 ENCOUNTER — Inpatient Hospital Stay (HOSPITAL_BASED_OUTPATIENT_CLINIC_OR_DEPARTMENT_OTHER): Admitting: Hematology

## 2023-07-18 DIAGNOSIS — C162 Malignant neoplasm of body of stomach: Secondary | ICD-10-CM | POA: Diagnosis not present

## 2023-07-18 DIAGNOSIS — Z5112 Encounter for antineoplastic immunotherapy: Secondary | ICD-10-CM | POA: Diagnosis not present

## 2023-07-18 LAB — MAGNESIUM: Magnesium: 2.1 mg/dL (ref 1.7–2.4)

## 2023-07-18 LAB — CBC WITH DIFFERENTIAL/PLATELET
Abs Immature Granulocytes: 0.01 10*3/uL (ref 0.00–0.07)
Basophils Absolute: 0 10*3/uL (ref 0.0–0.1)
Basophils Relative: 1 %
Eosinophils Absolute: 0.1 10*3/uL (ref 0.0–0.5)
Eosinophils Relative: 4 %
HCT: 38.4 % (ref 36.0–46.0)
Hemoglobin: 13.5 g/dL (ref 12.0–15.0)
Immature Granulocytes: 0 %
Lymphocytes Relative: 25 %
Lymphs Abs: 0.9 10*3/uL (ref 0.7–4.0)
MCH: 33.8 pg (ref 26.0–34.0)
MCHC: 35.2 g/dL (ref 30.0–36.0)
MCV: 96 fL (ref 80.0–100.0)
Monocytes Absolute: 0.5 10*3/uL (ref 0.1–1.0)
Monocytes Relative: 14 %
Neutro Abs: 2.1 10*3/uL (ref 1.7–7.7)
Neutrophils Relative %: 56 %
Platelets: 212 10*3/uL (ref 150–400)
RBC: 4 MIL/uL (ref 3.87–5.11)
RDW: 14.4 % (ref 11.5–15.5)
WBC: 3.7 10*3/uL — ABNORMAL LOW (ref 4.0–10.5)
nRBC: 0 % (ref 0.0–0.2)

## 2023-07-18 LAB — COMPREHENSIVE METABOLIC PANEL
ALT: 26 U/L (ref 0–44)
AST: 24 U/L (ref 15–41)
Albumin: 3.3 g/dL — ABNORMAL LOW (ref 3.5–5.0)
Alkaline Phosphatase: 75 U/L (ref 38–126)
Anion gap: 8 (ref 5–15)
BUN: 9 mg/dL (ref 6–20)
CO2: 27 mmol/L (ref 22–32)
Calcium: 8 mg/dL — ABNORMAL LOW (ref 8.9–10.3)
Chloride: 99 mmol/L (ref 98–111)
Creatinine, Ser: 0.62 mg/dL (ref 0.44–1.00)
GFR, Estimated: >60 mL/min (ref >60)
Glucose, Bld: 110 mg/dL — ABNORMAL HIGH (ref 70–99)
Potassium: 2.9 mmol/L — ABNORMAL LOW (ref 3.5–5.1)
Sodium: 134 mmol/L — ABNORMAL LOW (ref 135–145)
Total Bilirubin: 0.7 mg/dL (ref 0.0–1.2)
Total Protein: 5.7 g/dL — ABNORMAL LOW (ref 6.5–8.1)

## 2023-07-18 LAB — LIPASE, BLOOD: Lipase: 48 U/L (ref 11–51)

## 2023-07-18 MED ORDER — PROCHLORPERAZINE MALEATE 10 MG PO TABS
10.0000 mg | ORAL_TABLET | Freq: Four times a day (QID) | ORAL | 2 refills | Status: DC | PRN
  Filled 2023-07-18: qty 30, 8d supply, fill #0
  Filled 2023-07-25: qty 30, 8d supply, fill #1
  Filled 2023-08-15: qty 30, 8d supply, fill #2

## 2023-07-18 MED ORDER — POTASSIUM CHLORIDE CRYS ER 20 MEQ PO TBCR
40.0000 meq | EXTENDED_RELEASE_TABLET | Freq: Once | ORAL | Status: AC
  Administered 2023-07-18: 40 meq via ORAL
  Filled 2023-07-18: qty 4

## 2023-07-18 MED ORDER — ONDANSETRON HCL 8 MG PO TABS
8.0000 mg | ORAL_TABLET | Freq: Three times a day (TID) | ORAL | 2 refills | Status: DC | PRN
  Filled 2023-07-18: qty 30, 10d supply, fill #0

## 2023-07-18 MED ORDER — PROCHLORPERAZINE MALEATE 10 MG PO TABS
10.0000 mg | ORAL_TABLET | Freq: Four times a day (QID) | ORAL | 0 refills | Status: DC | PRN
  Filled 2023-07-18: qty 20, 5d supply, fill #0

## 2023-07-18 MED ORDER — HEPARIN SOD (PORK) LOCK FLUSH 100 UNIT/ML IV SOLN
500.0000 [IU] | Freq: Once | INTRAVENOUS | Status: AC
  Administered 2023-07-18: 500 [IU]
  Filled 2023-07-18: qty 5

## 2023-07-18 MED ORDER — DEXAMETHASONE 4 MG PO TABS
4.0000 mg | ORAL_TABLET | Freq: Two times a day (BID) | ORAL | 0 refills | Status: DC
  Filled 2023-07-18: qty 10, 3d supply, fill #0

## 2023-07-18 NOTE — Discharge Instructions (Signed)
 Your potassium was a little low today, we did give you some supplementation here. I have refilled your compazine to use at home when needed. Follow-up with Dr. Mosetta Putt in the morning-- she can review your labs from today's visit. Return here for new concerns.

## 2023-07-18 NOTE — Addendum Note (Signed)
 Addended by: [name] on: 07/18/2023 11:03 AM   Modules accepted: Orders

## 2023-07-18 NOTE — Progress Notes (Signed)
 [facility]   [phone] [phone]   Clinic Follow up Note   Patient Care Team: [name], MD as PCP - General (Internal Medicine) [name], MD as Consulting Physician (Oncology) 07/18/2023  I connected with Sheryl [name] on 07/18/23 at  8:00 AM EDT by telephone and verified that I am speaking with the correct person using two identifiers.   I discussed the limitations, risks, security and privacy concerns of performing an evaluation and management service by telephone and the availability of in person appointments. I also discussed with the patient that there may be a patient responsible charge related to this service. The patient expressed understanding and agreed to proceed.   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   -she started first line chemo FLOT on 1/10 -She understands that chemotherapy is palliative, to prolong her life.  We are unlikely going to cure her cancer. -PD-L1 0-1%, no significant benefit from PD-L1 immunotherapy, FO revealed FGFR2 amplification and fusion (+), FGFR inhibitors can be considered in future, no other targeted therapy available  -She has been tolerating chemo very well, will continue for now  -PET scan from 05/30/2022 was negative for primary tumor or metastatic disease, the known peritoneal mets did not show on PET.  -she has been tolerating chemo well overall. Due to fatigue, I have changed her chemo from FLOT to FOLFOX on 06/20/2022, she tolerated well -she previously asked the role of surgery,  depends on her next restaging CT scan findings, I may refer her to [facility] or [facility] to discuss HIPEC surgery  -due to her infusion reaction to oxaliplatin on C5, we added additional premeds and gave slow infusion over 4 hours for cycle 6 and she tolerated well  -She is not able to return to work due to the cancer and treatment related symptoms.  -She is tolerating FOLFOX well overall, with moderate fatigue for a few days after infusion but able to recover well.  No signs of neuropathy at this point. -Restaging CT abdomen pelvis from August 27, 2022 showed no residual disease.  -We again discussed maintenance therapy with Xeloda down the road, we will stop oxaliplatin when she develops side effects especially neuropathy, or after next scan -repeated staging CT from 11/26/2021 showed stable disease  -I have changed her treatment to maintenance Xeloda in early August 2024, she is tolerating well overall  -her NGS Caris showed positive Claudin 18.2, she is a candidate for zolbetuximab.  -Repeated EGD on March 21, 2023 showed residual gastric cancer.  We discussed option of changing her chemotherapy back to FOLFOX and add zolbetuximab.  -PET 06/03/2023 showed stable disease -Patient developed recurrent abdominal pain, similar to the symptoms she had when she was diagnosed.  She agreed to change treatment back to FOLFOX, and add Zolbetuximab. She started on 07/09/2023  Assessment and Plan    Metastatic gastric cancer She is undergoing chemotherapy with an antibody treatment and FOLFOX. Significant side effects, including nausea and vomiting, are impacting her ability to eat and maintain hydration. Adjustments to the chemotherapy regimen were discussed, including reducing the antibody treatment dose and potentially omitting oxaliplatin  in the next cycle to improve tolerance. The initial cycle of antibody treatment is nearly double the dose of subsequent cycles, which may reduce nausea over time. If she  tolerates the second cycle well, oxaliplatin may be reintroduced in the third cycle. - Postpone the next chemotherapy cycle to April 1st to allow for recovery. - Administer a reduced dose of the antibody treatment on April 1st, followed by leucovorin and a 5FU pump, with pump removal on April 3rd. - Schedule additional IV fluids on April 3rd after pump removal. - Consider reintroducing oxaliplatin in the third cycle if she tolerates the second cycle well. - Coordinate with the charge nurse to schedule appointments and ensure availability.  Chemotherapy-induced nausea and vomiting She experiences significant nausea and vomiting post-chemotherapy, persisting since last Wednesday, leading to an emergency room visit for IV fluids and potassium supplementation. The nausea is likely exacerbated by the chemotherapy regimen. Compazine has been used but is not fully effective. Additional antiemetics, including Zofran and dexamethasone, were discussed to manage symptoms and improve appetite and energy levels. Zofran may cause headaches and constipation, while Compazine may cause drowsiness. - Prescribe dexamethasone before lunch, starting with two tablets for the first two days, then one tablet for the next two to three days, not exceeding a week. - Prescribe Zofran every eight hours as needed, monitoring for headaches and constipation. - Continue Compazine, taking it 20-30 minutes before meals, up to three or four times a day. - Consider olanzapine if symptoms do not improve with dexamethasone. - Schedule IV fluids for Saturday to aid hydration.  Follow-up Close monitoring and follow-up are required to manage symptoms and adjust the treatment plan. Emphasis is on recovery and maintaining hydration before the next chemotherapy cycle. A second opinion referral to [facility] for medical oncology has been made. - Schedule follow-up appointment for lab, office visit, and infusion on March 26th, focusing on IV  fluids. - Plan for the next chemotherapy cycle to begin on April 1st, with follow-up on April 3rd for pump removal and additional IV fluids. - Ensure she receives a call from the scheduling team to confirm appointments. - Referral to [facility] for a second opinion with medical oncology.     Plan -Will arrange IV fluids later this week, and again next week -Postpone her cycle cycle 2 chemotherapy for 1 week, and will hold oxaliplatin for cycle 2 -Office visit next week as scheduled    SUMMARY OF ONCOLOGIC HISTORY: Oncology History Overview Note   Cancer Staging  Gastric cancer [facility]) Staging form: Stomach, AJCC 8th Edition - Clinical stage from 04/19/2022: Stage IVB (cT2, cN0, pM1) - Signed by [name], MD on 05/08/2022 Total positive nodes: 0     Gastric cancer (HCC)  03/30/2022 Procedure   EGD:  Impression:  - Normal esophagus. - A few gastric polyps. Biopsied. - Gastritis. Biopsied. - Non-bleeding gastric ulcer with no stigmata of bleeding. Biopsied. - Normal examined duodenum. Biopsied.  Findings: Diffuse moderate inflammation characterized by congestion (edema), friability and granularity was found in the cardia, in the gastric fundus and in the gastric body. There were associated erosions in multiple places. Biopsies were taken from the antrum, body, and fundus with a cold forceps for histology. Estimated blood loss was minimal.  One non-bleeding cratered gastric ulcer with no stigmata of bleeding was found on the greater curvature of the stomach. The lesion was 6 mm in largest dimension. The mucosa around the ulcer was heaped and led to some deformity in the antrum.  Biopsies were taken with a cold forceps for histology. Estimated blood loss was minimal.    03/30/2022 Pathology Results   [name], Sheryl [name]  Accession: [id number]  Diagnosis 1. Surgical [P], duodenal - BENIGN SMALL BOWEL MUCOSA WITH NO SIGNIFICANT PATHOLOGIC CHANGES 2. Surgical [P], gastric  antrum - GASTRIC ANTRAL MUCOSA WITH FEATURES OF REACTIVE GASTROPATHY - NEGATIVE FOR H. PYLORI ON H&E STAIN - NEGATIVE FOR INTESTINAL METAPLASIA OR MALIGNANCY 3. Surgical [P], gastric body - GASTRIC OXYNTIC MUCOSA WITH REACTIVE/REPARATIVE CHANGES - NEGATIVE FOR H. PYLORI ON H&E STAIN - NEGATIVE FOR INTESTINAL METAPLASIA, DYSPLASIA OR MALIGNANCY 4. Surgical [P], greater curve ulceration - ADENOCARCINOMA WITH SIGNET RING CELL FEATURES (SEE NOTE) 5. Surgical [P], gastric polyps - ADENOCARCINOMA WITH SIGNET RING CELL FEATURES (SEE NOTE) 6. Surgical [P], fundus (gastric) - ADENOCARCINOMA WITH SIGNET RING CELL FEATURES (SEE NOTE) 7. Surgical [P], colon, ascending, polyp (1) - TUBULAR ADENOMA. - NO HIGH GRADE DYSPLASIA OR MALIGNANCY. 8. Surgical [P], colon, transverse, polyp (1) - TUBULAR ADENOMA. - NO HIGH GRADE DYSPLASIA OR MALIGNANCY.    04/13/2022 Initial Diagnosis   Gastric cancer (HCC)   04/19/2022 Cancer Staging   Staging form: Stomach, AJCC 8th Edition - Clinical stage from 04/19/2022: Stage IVB (cT2, cN0, pM1) - Signed by [name], MD on 05/08/2022 Total positive nodes: 0   05/05/2022 Genetic Testing   Negative genetic testing on the Multi-cancer gene panel + RNA.  FH c.259C>T VUS identified.  The report date is May 05, 2022.  The Multi-Cancer + RNA Panel offered by [facility] includes sequencing and/or deletion/duplication analysis of the following 70 genes:  AIP*, ALK, APC*, ATM*, AXIN2*, BAP1*, BARD1*, BLM*, BMPR1A*, BRCA1*, BRCA2*, BRIP1*, CDC73*, CDH1*, CDK4, CDKN1B*, CDKN2A, CHEK2*, CTNNA1*, DICER1*, EPCAM (del/dup only), EGFR, FH*, FLCN*, GREM1 (promoter dup only), HOXB13, KIT, LZTR1, MAX*, MBD4, MEN1*, MET, MITF, MLH1*, MSH2*, MSH3*, MSH6*, MUTYH*, NF1*, NF2*, NTHL1*, PALB2*, PDGFRA, PMS2*, POLD1*, POLE*, POT1*, PRKAR1A*, PTCH1*, PTEN*, RAD51C*, RAD51D*, RB1*, RET, SDHA* (sequencing only), SDHAF2*, SDHB*, SDHC*, SDHD*, SMAD4*, SMARCA4*, SMARCB1*, SMARCE1*, STK11*, SUFU*,  TMEM127*, TP53*, TSC1*, TSC2*, VHL*. RNA analysis is performed for * genes.    05/09/2022 - 06/07/2022 Chemotherapy   Patient is on Treatment Plan : GASTROESOPHAGEAL FLOT q14d X 4 cycles      Miscellaneous   Foundation One  Biomarker Findings Microsatellite status- Cannot be determined Tumor Mutational Burden- Cannot be determined  Genomic Findings  FGFR2 amplification,FGFR2-TACC2 fusion,  Rearrangement intron 17 ARAF amplification CCND3 amplification TP53 [id number]*74     05/30/2022 Imaging    IMPRESSION: 1. Mild hypermetabolism corresponding to a dominant left upper quadrant mass and smaller perigastric nodules or nodes. 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 Recommend attention to the dominant left upper quadrant soft tissue mass on follow-up exams to exclude unlikely recurrent lymphoma. 2. No gastric hypermetabolism and no typical findings of metastatic disease.   06/20/2022 - 11/16/2022 Chemotherapy   Patient is on Treatment Plan : GASTRIC FOLFOX q14d x 12 cycles     08/27/2022 Imaging    IMPRESSION: No focal gastric mass on CT.   No findings suspicious for recurrent or metastatic disease.   Stable left upper abdominal soft tissue lesion and small lymph nodes, chronic, favoring treated lymphoma.   11/27/2022 Imaging    IMPRESSION: 1. Questionable thickening of the distal esophagus/GE junction and gastric antrum, consider further evaluation with endoscopy. 2. Chronically stable left upper quadrant nodularity and prominent lymph nodes again favored treated lymphoma. Continued attention on follow-up imaging  suggested. 3. No convincing evidence of metastatic disease in the chest, abdomen or pelvis. 4. Questionable asymmetric wall thickening of the rectum, consider further evaluation with colonoscopy. 5. Mild wall thickening of a nondistended urinary bladder, correlate with urinalysis to exclude cystitis. 6. Hepatic steatosis.    07/10/2023 -  Chemotherapy   Patient is on Treatment Plan : GASTROESOPHAGEAL Zolbetuximab (800/400) + FOLFOX D1,15,29 q42d x 4 cycles / Zolbetuximab (400) + 5FU + Leucovorin D1,15,29 q42d       Discussed the use of AI scribe software for clinical note transcription with the patient, who gave verbal consent to proceed.  History of Present Illness   A 58 year old patient with a history of metastatic gastric cancer presents for a phone follow-up after her recent chemotherapy treatment. She reports severe, persistent nausea and vomiting since her last chemotherapy session, which has led to an inability to keep anything in her system. This has resulted in a visit to the emergency room for treatment. She describes the pain as only occurring when she is vomiting, and she has nothing to throw up due to her inability to eat. Even liquids such as juice and water are not tolerated and result in vomiting. She has been taking Compazine for nausea, but it does not seem to be effective. She also reports using a scopolamine patch. The patient's son is concerned about her dehydration and lack of food intake due to the persistent nausea and vomiting.         REVIEW OF SYSTEMS:   Constitutional: Denies fevers, chills or abnormal weight loss Eyes: Denies blurriness of vision Ears, nose, mouth, throat, and face: Denies mucositis or sore throat Respiratory: Denies cough, dyspnea or wheezes Cardiovascular: Denies palpitation, chest discomfort or lower extremity swelling Gastrointestinal:  Denies nausea, heartburn or change in bowel habits Skin: Denies abnormal skin rashes Lymphatics: Denies new lymphadenopathy or easy bruising Neurological:Denies numbness, tingling or new weaknesses Behavioral/Psych: Mood is stable, no new changes  All other systems were reviewed with the patient and are negative.  MEDICAL HISTORY:  Past Medical History:  Diagnosis Date   Blood transfusion without reported diagnosis    had  transfusion with hysterectomy   Cataract    Colon polyps 2012   Diabetes (HCC) 03/13/2021   Diabetes (HCC) 05/21/2019   Family history of breast cancer    Family history of pancreatic cancer    Family history of stomach cancer    Fibroid    gastric ca 03/2022   GERD (gastroesophageal reflux disease)    H/O blood clots    History of hysterectomy    fibroids and heavy cycles   Hypertension     SURGICAL HISTORY: Past Surgical History:  Procedure Laterality Date   ABDOMINAL HYSTERECTOMY     BIOPSY  04/19/2022   Procedure: BIOPSY;  Surgeon: Lemar Lofty., MD;  Location: [facility];  Service: Gastroenterology;;   BIOPSY  03/21/2023   Procedure: BIOPSY;  Surgeon: Lemar Lofty., MD;  Location: [facility];  Service: Gastroenterology;;   COLONOSCOPY     ESOPHAGOGASTRODUODENOSCOPY (EGD) WITH PROPOFOL N/A 04/19/2022   Procedure: ESOPHAGOGASTRODUODENOSCOPY (EGD) WITH PROPOFOL;  Surgeon: Lemar Lofty., MD;  Location: [facility];  Service: Gastroenterology;  Laterality: N/A;   ESOPHAGOGASTRODUODENOSCOPY (EGD) WITH PROPOFOL N/A 03/21/2023   Procedure: ESOPHAGOGASTRODUODENOSCOPY (EGD) WITH PROPOFOL;  Surgeon: Meridee Score Netty Starring., MD;  Location: [facility];  Service: Gastroenterology;  Laterality: N/A;   EUS N/A 04/19/2022   Procedure: UPPER ENDOSCOPIC ULTRASOUND (EUS) RADIAL;  Surgeon: Lemar Lofty.,  MD;  Location: [facility];  Service: Gastroenterology;  Laterality: N/A;   EXCISION OF SKIN TAG  05/03/2022   Procedure: EXCISION OF CHEST WALL SKIN LESION;  Surgeon: Fritzi Mandes, MD;  Location: [facility];  Service: General;;   LAPAROSCOPY N/A 05/03/2022   Procedure: LAPAROSCOPY DIAGNOSTIC WITH PERITONEAL WASHINGS;  Surgeon: Fritzi Mandes, MD;  Location: [facility] OR;  Service: General;  Laterality: N/A;   POLYPECTOMY  04/19/2022   Procedure: POLYPECTOMY;  Surgeon: Lemar Lofty., MD;  Location: [facility];  Service: Gastroenterology;;    PORTACATH PLACEMENT N/A 05/03/2022   Procedure: INSERTION PORT-A-CATH WITH ULTRASOUND GUIDANCE;  Surgeon: Fritzi Mandes, MD;  Location: [facility] OR;  Service: General;  Laterality: N/A;   UPPER GASTROINTESTINAL ENDOSCOPY      I have reviewed the social history and family history with the patient and they are unchanged from previous note.  ALLERGIES:  is allergic to aspirin, cyclobenzaprine, naproxen sodium, zithromax [azithromycin dihydrate], oxaliplatin, and dilaudid [hydromorphone].  MEDICATIONS:  Current Outpatient Medications  Medication Sig Dispense Refill   dexamethasone (DECADRON) 4 MG tablet Take 2 tabs daily for 2-3 days then 1 tab daily for additional 2-4 days, total no more than 7 days 10 tablet 0   ondansetron (ZOFRAN) 8 MG tablet Take 1 tablet (8 mg total) by mouth every 8 (eight) hours as needed for nausea or vomiting. 30 tablet 2   acetaminophen (TYLENOL) 500 MG tablet Take 2 tablets (1,000 mg total) by mouth every 8 (eight) hours as needed (pain). 30 tablet 1   amLODipine (NORVASC) 10 MG tablet Take 1 tablet (10 mg total) by mouth daily. 30 tablet 2   b complex vitamins capsule Take 1 capsule by mouth daily.     Cyanocobalamin (VITAMIN B 12 PO) Take by mouth.     famotidine (PEPCID) 20 MG tablet Take 1 tablet (20 mg total) by mouth 2 (two) times daily. 60 tablet 2   HYDROcodone-acetaminophen (NORCO/VICODIN) 5-325 MG tablet Take 1 tablet by mouth every 6 (six) hours as needed for moderate pain (pain score 4-6). 20 tablet 0   lidocaine-prilocaine (EMLA) cream Apply 1 Application topically as needed. 30 g 1   POTASSIUM PO Take 99 mg by mouth daily.     prochlorperazine (COMPAZINE) 10 MG tablet Take 1 tablet (10 mg total) by mouth every 6 (six) hours as needed for nausea or vomiting. 30 tablet 2   scopolamine (TRANSDERM-SCOP) 1 MG/3DAYS Place 1 patch (1.5 mg total) onto the skin every 3 (three) days. 10 patch 1   sucralfate (CARAFATE) 1 g tablet Take 1 tablet (1 g total) by mouth 2  (two) times daily. 60 tablet 6   No current facility-administered medications for this visit.    PHYSICAL EXAMINATION: Not performed   LABORATORY DATA:  I have reviewed the data as listed    Latest Ref Rng & Units 07/18/2023    1:00 AM 07/09/2023    9:12 AM 07/03/2023    9:17 AM  CBC  WBC 4.0 - 10.5 K/uL 3.7  3.8  3.2   Hemoglobin 12.0 - 15.0 g/dL 16.1  09.6  04.5   Hematocrit 36.0 - 46.0 % 38.4  37.8  37.7   Platelets 150 - 400 K/uL 212  246  243         Latest Ref Rng & Units 07/18/2023    1:00 AM 07/09/2023    9:12 AM 07/03/2023    9:17 AM  CMP  Glucose 70 - 99 mg/dL 409  113  114   BUN 6 - 20 mg/dL 9  10  9    Creatinine 0.44 - 1.00 mg/dL 7.82  9.56  2.13   Sodium 135 - 145 mmol/L 134  139  140   Potassium 3.5 - 5.1 mmol/L 2.9  3.4  3.5   Chloride 98 - 111 mmol/L 99  105  105   CO2 22 - 32 mmol/L 27  28  29    Calcium 8.9 - 10.3 mg/dL 8.0  9.2  9.2   Total Protein 6.5 - 8.1 g/dL 5.7  7.1  7.2   Total Bilirubin 0.0 - 1.2 mg/dL 0.7  1.1  0.8   Alkaline Phos 38 - 126 U/L 75  120  128   AST 15 - 41 U/L 24  37  33   ALT 0 - 44 U/L 26  28  27        RADIOGRAPHIC STUDIES: I have personally reviewed the radiological images as listed and agreed with the findings in the report. No results found.     I discussed the assessment and treatment plan with the patient. The patient was provided an opportunity to ask questions and all were answered. The patient agreed with the plan and demonstrated an understanding of the instructions.   The patient was advised to call back or seek an in-person evaluation if the symptoms worsen or if the condition fails to improve as anticipated.  I provided 25 minutes of non face-to-face telephone visit time during this encounter, and > 50% was spent counseling as documented under my assessment & plan.     [name], MD 07/18/23

## 2023-07-19 ENCOUNTER — Inpatient Hospital Stay

## 2023-07-19 VITALS — BP 152/94 | HR 79 | Resp 18

## 2023-07-19 DIAGNOSIS — Z5112 Encounter for antineoplastic immunotherapy: Secondary | ICD-10-CM | POA: Diagnosis not present

## 2023-07-19 DIAGNOSIS — Z95828 Presence of other vascular implants and grafts: Secondary | ICD-10-CM

## 2023-07-19 MED ORDER — SODIUM CHLORIDE 0.9% FLUSH
10.0000 mL | Freq: Once | INTRAVENOUS | Status: AC | PRN
  Administered 2023-07-19: 10 mL

## 2023-07-19 MED ORDER — HEPARIN SOD (PORK) LOCK FLUSH 100 UNIT/ML IV SOLN
500.0000 [IU] | Freq: Once | INTRAVENOUS | Status: AC | PRN
Start: 2023-07-19 — End: 2023-07-19
  Administered 2023-07-19: 500 [IU]

## 2023-07-19 MED ORDER — SODIUM CHLORIDE 0.9 % IV SOLN: Freq: Once | INTRAVENOUS | Status: AC

## 2023-07-19 NOTE — Patient Instructions (Signed)

## 2023-07-23 ENCOUNTER — Encounter: Payer: Self-pay | Admitting: Hematology

## 2023-07-23 NOTE — Progress Notes (Unsigned)
 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   -she started first line chemo FLOT on 1/10 -She understands that chemotherapy is palliative, to prolong her life.  We are unlikely going to cure her cancer. -PD-L1 0-1%, no significant benefit from PD-L1 immunotherapy, FO revealed FGFR2 amplification and fusion (+), FGFR inhibitors can be considered in future, no other targeted therapy available  -She has been tolerating chemo very well, will continue for now  -PET scan from 05/30/2022 was negative for primary tumor or metastatic disease, the known peritoneal mets did not show on PET.  -she has been tolerating chemo well overall. Due to fatigue, I have changed her chemo from FLOT to FOLFOX on 06/20/2022, she tolerated well -she previously asked the role of surgery, depends on her next restaging CT scan findings, I may refer her to [facility] or [facility] to discuss HIPEC surgery  -due to her infusion reaction to oxaliplatin on C5, we added additional premeds and gave slow infusion over 4 hours for cycle 6 and she tolerated well  -She is not able to return to work due to the cancer and treatment related symptoms.  -She is tolerating FOLFOX well overall, with moderate fatigue for a few days after infusion but able to recover well.  No signs of neuropathy at this point. -Restaging CT abdomen pelvis from August 27, 2022 showed no residual disease.  -We again discussed maintenance therapy with Xeloda down the road, we will stop oxaliplatin when she develops side effects  especially neuropathy, or after next scan -repeated staging CT from 11/26/2021 showed stable disease  -I have changed her treatment to maintenance Xeloda in early August 2024, she is tolerating well overall  -her NGS Caris showed positive Claudin 18.2, she is a candidate for zolbetuximab.  -Repeated EGD on March 21, 2023 showed residual gastric cancer.  We discussed option of changing her chemotherapy back to FOLFOX and add zolbetuximab.  -PET 06/03/2023 showed stable disease -Patient developed recurrent abdominal pain, similar to the symptoms she had when she was diagnosed.  She agreed to change treatment back to FOLFOX, and add Zolbetuximab. -Initial treatment with FOLFOX and zolbetuximab was 07/09/2023.  She did experience significant nausea, vomiting, and poor appetite.  Today, she is getting IV fluids and IV antiemetics as needed.  She will also receive 20 mEq KCl IV today.  The initial dose of zolbetuximab is higher than subsequent dosing so hopefully, future doses will not result in such significant side effects.  Second cycle has been delayed until 08/06/2023 in order to give her time to recover.  Oxaliplatin to be eliminated from cycle 2.  Consider adding this back if she tolerates cycle 2 better.  The patient and her husband agree with further delay in treatment. --Of note, patient does have appointment at [facility] cancer center with Dr. Rod Mae for second opinion on 08/01/2023.   Nausea and vomiting due to chemotherapy Patient scheduled for IV fluids and antiemetics today.  Weight loss of 17 pounds since July 10, 2023.  Patient reports slightly improved appetite over the last few days.  Was able to tolerate a  fistulizing watch yesterday.  Had to eat very slowly.  Drinking water.  Will delay next treatment, initially scheduled for 07/30/2023, for 1 week, restarting 08/06/2023.  Patient and her husband, on the phone during today's visit, both voiced understanding and agree with plan.  Hypokalemia Potassium  3.0 today.  Likely due to persistent nausea and vomiting diarrhea over last 2 weeks.  Plan to administer KCl 20 mEq IV along with IV fluids today.  Advised patient to improve hydration as well as calorie and protein intake.  If unable to tolerate Boost or Ensure, encouraged her to make her own smoothies with yogurt, ice cream, peanut butter, or fruits that are more palatable.  Plan: The patient was seen along with Dr. Mosetta Putt today. Reviewed labs.  Hypokalemia with potassium of 3.0.  Mild but improved hypocalcemia. -Administer IV fluids along with 20 mEq of KCl IVPB. -Encouraged improved calorie and protein intake.  Encouraged improved hydration. -Defer restart of chemotherapy until 08/06/2023, giving her an additional week to recover.  Plan to hold oxaliplatin from upcoming chemotherapy treatment.  Continue with 5-FU pump and zolbetuximab. -- Patient to stop at scheduling after IV fluids to get new schedule for upcoming treatments. She does have appointment with Dr. Rod Mae at [facility] on August 01, 2023 for second opinion.  The patient understands the plans discussed today and is in agreement with them.  She knows to contact our office if she develops concerns prior to her next appointment.  I provided 25 minutes of face-to-face time during this encounter and > 50% was spent counseling as documented under my assessment and plan.    Carlean Jews, NP  [facility] - A DEPT OF [facility] [address] [place] [address] Dept: [phone] Dept Fax: [phone]   Orders Placed This Encounter  Procedures   CBC with Differential (Cancer Center Only)    Standing Status:   Future    Expected Date:   08/06/2023    Expiration Date:   08/05/2024   CMP (Cancer Center only)    Standing Status:   Future    Expected Date:   08/06/2023    Expiration Date:   08/05/2024      CHIEF COMPLAINT:  CC: Malignant neoplasm of body of  stomach  Current Treatment: FOLFOX and zolbetuximab  INTERVAL HISTORY:  Sharlett is here today for repeat clinical assessment.  Treatment was recently changed back to FOLFOX with addition of zolbetuximab.  Initial treatment was 07/09/2023.  She did experience significant nausea, vomiting, diarrhea, and poor appetite.  Today, she is getting IV fluids and IV antiemetics as needed.  She will also get 20 mEq of KCl IV BPP today.  The initial dose of zolbetuximab is higher than subsequent dosing so hopefully, future doses will not result in such significant side effects.  Second cycle has been delayed until 08/06/2023 in order to give her time to recover.  Oxaliplatin to be eliminated from cycle 2.  Consider adding this back if she tolerates cycle 2 better.  She denies fevers or chills. She denies pain.  Reports pain in upper abdomen has resolved.  Last episode of vomiting was 07/20/2023.  Last episode of diarrhea was 07/19/2023.  Her appetite is poor but improving. Her weight has decreased 17 pounds over last 2 weeks .  I have reviewed the past medical history, past surgical history, social history and family history with the patient and they  are unchanged from previous note.  ALLERGIES:  is allergic to aspirin, cyclobenzaprine, naproxen sodium, zithromax [azithromycin dihydrate], oxaliplatin, and dilaudid [hydromorphone].  MEDICATIONS:  Current Outpatient Medications  Medication Sig Dispense Refill   acetaminophen (TYLENOL) 500 MG tablet Take 2 tablets (1,000 mg total) by mouth every 8 (eight) hours as needed (pain). 30 tablet 1   amLODipine (NORVASC) 10 MG tablet Take 1 tablet (10 mg total) by mouth daily. 30 tablet 2   b complex vitamins capsule Take 1 capsule by mouth daily.     Cyanocobalamin (VITAMIN B 12 PO) Take by mouth.     dexamethasone (DECADRON) 4 MG tablet Take 2 tabs daily for 2-3 days then 1 tab daily for additional 2-4 days, total no more than 7 days 10 tablet 0   famotidine (PEPCID) 20 MG  tablet Take 1 tablet (20 mg total) by mouth 2 (two) times daily. 60 tablet 2   HYDROcodone-acetaminophen (NORCO/VICODIN) 5-325 MG tablet Take 1 tablet by mouth every 6 (six) hours as needed for moderate pain (pain score 4-6). 20 tablet 0   lidocaine-prilocaine (EMLA) cream Apply 1 Application topically as needed. 30 g 1   ondansetron (ZOFRAN) 8 MG tablet Take 1 tablet (8 mg total) by mouth every 8 (eight) hours as needed for nausea or vomiting. 30 tablet 2   POTASSIUM PO Take 99 mg by mouth daily.     prochlorperazine (COMPAZINE) 10 MG tablet Take 1 tablet (10 mg total) by mouth every 6 (six) hours as needed for nausea or vomiting. 30 tablet 2   scopolamine (TRANSDERM-SCOP) 1 MG/3DAYS Place 1 patch (1.5 mg total) onto the skin every 3 (three) days. 10 patch 1   sucralfate (CARAFATE) 1 g tablet Take 1 tablet (1 g total) by mouth 2 (two) times daily. 60 tablet 6   No current facility-administered medications for this visit.   Facility-Administered Medications Ordered in Other Visits  Medication Dose Route Frequency Provider Last Rate Last Admin   potassium chloride 10 mEq in 100 mL IVPB  10 mEq Intravenous Q1 Hr x 2 Cherry Turlington E, NP 100 mL/hr at 07/24/23 0944 10 mEq at 07/24/23 0944   promethazine (PHENERGAN) 25 mg in sodium chloride 0.9 % 50 mL IVPB  25 mg Intravenous Once [name], MD        HISTORY OF PRESENT ILLNESS:   Oncology History Overview Note   Cancer Staging  Gastric cancer [facility]) Staging form: Stomach, AJCC 8th Edition - Clinical stage from 04/19/2022: Stage IVB (cT2, cN0, pM1) - Signed by [name], MD on 05/08/2022 Total positive nodes: 0     Gastric cancer (HCC)  03/30/2022 Procedure   EGD:  Impression:  - Normal esophagus. - A few gastric polyps. Biopsied. - Gastritis. Biopsied. - Non-bleeding gastric ulcer with no stigmata of bleeding. Biopsied. - Normal examined duodenum. Biopsied.  Findings: Diffuse moderate inflammation characterized by congestion (edema),  friability and granularity was found in the cardia, in the gastric fundus and in the gastric body. There were associated erosions in multiple places. Biopsies were taken from the antrum, body, and fundus with a cold forceps for histology. Estimated blood loss was minimal.  One non-bleeding cratered gastric ulcer with no stigmata of bleeding was found on the greater curvature of the stomach. The lesion was 6 mm in largest dimension. The mucosa around the ulcer was heaped and led to some deformity in the antrum. Biopsies were taken with a cold forceps for histology. Estimated blood loss was minimal.  03/30/2022 Pathology Results   Patient: JIZEL, CHEEKS  Accession: [id number]  Diagnosis 1. Surgical [P], duodenal - BENIGN SMALL BOWEL MUCOSA WITH NO SIGNIFICANT PATHOLOGIC CHANGES 2. Surgical [P], gastric antrum - GASTRIC ANTRAL MUCOSA WITH FEATURES OF REACTIVE GASTROPATHY - NEGATIVE FOR H. PYLORI ON H&E STAIN - NEGATIVE FOR INTESTINAL METAPLASIA OR MALIGNANCY 3. Surgical [P], gastric body - GASTRIC OXYNTIC MUCOSA WITH REACTIVE/REPARATIVE CHANGES - NEGATIVE FOR H. PYLORI ON H&E STAIN - NEGATIVE FOR INTESTINAL METAPLASIA, DYSPLASIA OR MALIGNANCY 4. Surgical [P], greater curve ulceration - ADENOCARCINOMA WITH SIGNET RING CELL FEATURES (SEE NOTE) 5. Surgical [P], gastric polyps - ADENOCARCINOMA WITH SIGNET RING CELL FEATURES (SEE NOTE) 6. Surgical [P], fundus (gastric) - ADENOCARCINOMA WITH SIGNET RING CELL FEATURES (SEE NOTE) 7. Surgical [P], colon, ascending, polyp (1) - TUBULAR ADENOMA. - NO HIGH GRADE DYSPLASIA OR MALIGNANCY. 8. Surgical [P], colon, transverse, polyp (1) - TUBULAR ADENOMA. - NO HIGH GRADE DYSPLASIA OR MALIGNANCY.    04/13/2022 Initial Diagnosis   Gastric cancer (HCC)   04/19/2022 Cancer Staging   Staging form: Stomach, AJCC 8th Edition - Clinical stage from 04/19/2022: Stage IVB (cT2, cN0, pM1) - Signed by [name], MD on 05/08/2022 Total positive nodes:  0   05/05/2022 Genetic Testing   Negative genetic testing on the Multi-cancer gene panel + RNA.  FH c.259C>T VUS identified.  The report date is May 05, 2022.  The Multi-Cancer + RNA Panel offered by [facility] includes sequencing and/or deletion/duplication analysis of the following 70 genes:  AIP*, ALK, APC*, ATM*, AXIN2*, BAP1*, BARD1*, BLM*, BMPR1A*, BRCA1*, BRCA2*, BRIP1*, CDC73*, CDH1*, CDK4, CDKN1B*, CDKN2A, CHEK2*, CTNNA1*, DICER1*, EPCAM (del/dup only), EGFR, FH*, FLCN*, GREM1 (promoter dup only), HOXB13, KIT, LZTR1, MAX*, MBD4, MEN1*, MET, MITF, MLH1*, MSH2*, MSH3*, MSH6*, MUTYH*, NF1*, NF2*, NTHL1*, PALB2*, PDGFRA, PMS2*, POLD1*, POLE*, POT1*, PRKAR1A*, PTCH1*, PTEN*, RAD51C*, RAD51D*, RB1*, RET, SDHA* (sequencing only), SDHAF2*, SDHB*, SDHC*, SDHD*, SMAD4*, SMARCA4*, SMARCB1*, SMARCE1*, STK11*, SUFU*, TMEM127*, TP53*, TSC1*, TSC2*, VHL*. RNA analysis is performed for * genes.    05/09/2022 - 06/07/2022 Chemotherapy   Patient is on Treatment Plan : GASTROESOPHAGEAL FLOT q14d X 4 cycles      Miscellaneous   Foundation One  Biomarker Findings Microsatellite status- Cannot be determined Tumor Mutational Burden- Cannot be determined  Genomic Findings  FGFR2 amplification,FGFR2-TACC2 fusion,  Rearrangement intron 17 ARAF amplification CCND3 amplification TP53 V235fs*74     05/30/2022 Imaging    IMPRESSION: 1. Mild hypermetabolism corresponding to a dominant left upper quadrant mass and smaller perigastric nodules or nodes. 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 Recommend attention to the dominant left upper quadrant soft tissue mass on follow-up exams to exclude unlikely recurrent lymphoma. 2. No gastric hypermetabolism and no typical findings of metastatic disease.   06/20/2022 - 11/16/2022 Chemotherapy   Patient is on Treatment Plan : GASTRIC FOLFOX q14d x 12 cycles     08/27/2022 Imaging    IMPRESSION: No focal gastric mass on CT.    No findings suspicious for recurrent or metastatic disease.   Stable left upper abdominal soft tissue lesion and small lymph nodes, chronic, favoring treated lymphoma.   11/27/2022 Imaging    IMPRESSION: 1. Questionable thickening of the distal esophagus/GE junction and gastric antrum, consider further evaluation with endoscopy. 2. Chronically stable left upper quadrant nodularity and prominent lymph nodes again favored treated lymphoma. Continued attention on follow-up imaging suggested. 3. No convincing evidence of metastatic disease in the chest, abdomen or pelvis. 4. Questionable asymmetric  wall thickening of the rectum, consider further evaluation with colonoscopy. 5. Mild wall thickening of a nondistended urinary bladder, correlate with urinalysis to exclude cystitis. 6. Hepatic steatosis.   07/10/2023 -  Chemotherapy   Patient is on Treatment Plan : GASTROESOPHAGEAL Zolbetuximab (800/400) + FOLFOX D1,15,29 q42d x 4 cycles / Zolbetuximab (400) + 5FU + Leucovorin D1,15,29 q42d         REVIEW OF SYSTEMS:   Constitutional: Denies fevers or chills.  Has had very poor appetite and a 17 pound weight loss over the last 2 weeks.  She feels very fatigued. Eyes: Denies blurriness of vision Ears, nose, mouth, throat, and face: Denies mucositis or sore throat Respiratory: Denies cough, dyspnea or wheezes Cardiovascular: Denies palpitation, chest discomfort or lower extremity swelling Gastrointestinal: Had significant nausea, vomiting, and diarrhea after most recent treatment on 07/09/2023.  This is very slowly improving. Skin: Denies abnormal skin rashes Lymphatics: Denies new lymphadenopathy or easy bruising Neurological:Denies numbness, tingling or new weaknesses Behavioral/Psych: Mood is stable, no new changes  All other systems were reviewed with the patient and are negative.   VITALS:   Today's Vitals   07/24/23 0840 07/24/23 0848  BP: 110/64   Pulse: 62   Resp: 20    Temp: 97.7 F (36.5 C)   TempSrc: Temporal   SpO2: 99%   Weight: 192 lb 1.6 oz (87.1 kg)   Height: 5\' 8"  (1.727 m)   PainSc:  0-No pain   Body mass index is 29.21 kg/m.   Wt Readings from Last 3 Encounters:  07/24/23 192 lb 1.6 oz (87.1 kg)  07/17/23 209 lb 7 oz (95 kg)  07/09/23 207 lb 6.4 oz (94.1 kg)    Body mass index is 29.21 kg/m.  Performance status (ECOG): 2 - Symptomatic, <50% confined to bed  PHYSICAL EXAM:   GENERAL:alert, no distress and comfortable.  She appears very fatigued and weak. SKIN: skin color, texture, turgor are normal, no rashes or significant lesions EYES: normal, Conjunctiva are pink and non-injected, sclera clear OROPHARYNX:no exudate, no erythema and lips, buccal mucosa, and tongue normal  NECK: supple, thyroid normal size, non-tender, without nodularity LYMPH:  no palpable lymphadenopathy in the cervical, axillary or inguinal LUNGS: clear to auscultation and percussion with normal breathing effort HEART: regular rate & rhythm and no murmurs and no lower extremity edema ABDOMEN:abdomen soft, non-tender and normal bowel sounds Musculoskeletal:no cyanosis of digits and no clubbing  NEURO: alert & oriented x 3 with fluent speech, no focal motor/sensory deficits  LABORATORY DATA:  I have reviewed the data as listed    Component Value Date/Time   NA 139 07/24/2023 0822   NA 137 12/20/2020 0957   K 3.0 (L) 07/24/2023 0822   CL 103 07/24/2023 0822   CO2 30 07/24/2023 0822   GLUCOSE 140 (H) 07/24/2023 0822   BUN 15 07/24/2023 0822   BUN 11 12/20/2020 0957   CREATININE 0.65 07/24/2023 0822   CALCIUM 8.3 (L) 07/24/2023 0822   PROT 5.7 (L) 07/24/2023 0822   PROT 7.3 12/20/2020 0957   ALBUMIN 3.6 07/24/2023 0822   ALBUMIN 4.7 12/20/2020 0957   AST 18 07/24/2023 0822   ALT 22 07/24/2023 0822   ALKPHOS 86 07/24/2023 0822   BILITOT 0.5 07/24/2023 0822   GFRNONAA >60 07/24/2023 0822   GFRAA 106 03/31/2020 1036    Lab Results  Component  Value Date   WBC 4.9 07/24/2023   NEUTROABS 3.2 07/24/2023   HGB 14.2 07/24/2023   HCT 38.7  07/24/2023   MCV 93.5 07/24/2023   PLT 246 07/24/2023   Addendum I have seen the patient, examined her. I agree with the assessment and and plan and have edited the notes.   Caytlyn is recovering slowly from her first cycle.  FOLFOX and Zolbetuximab. Her abdominal pain has resolved which is a good sign.  Will defer her cycle 2 chemotherapy for 2 weeks to allow her recover well, and I also discussed the option of hold oxaliplatin for second cycle to see how she does.  Will give her IV fluids and IV potassium today.  All questions were answered.  I spent a total of 30 minutes for her visit today, more than 50% time on face-to-face counseling.  [name] MD  07/24/2023

## 2023-07-23 NOTE — Assessment & Plan Note (Signed)
 [id number] with peritoneal metastasis. 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   -she started first line chemo FLOT on 1/10 -She understands that chemotherapy is palliative, to prolong her life.  We are unlikely going to cure her cancer. -PD-L1 0-1%, no significant benefit from PD-L1 immunotherapy, FO revealed FGFR2 amplification and fusion (+), FGFR inhibitors can be considered in future, no other targeted therapy available  -She has been tolerating chemo very well, will continue for now  -PET scan from 05/30/2022 was negative for primary tumor or metastatic disease, the known peritoneal mets did not show on PET.  -she has been tolerating chemo well overall. Due to fatigue, I have changed her chemo from FLOT to FOLFOX on 06/20/2022, she tolerated well -she previously asked the role of surgery, depends on her next restaging CT scan findings, I may refer her to [facility] or [facility] to discuss HIPEC surgery  -due to her infusion reaction to oxaliplatin on C5, we added additional premeds and gave slow infusion over 4 hours for cycle 6 and she tolerated well  -She is not able to return to work due to the cancer and treatment related symptoms.  -She is tolerating FOLFOX well overall, with moderate fatigue for a few days after infusion but able to recover well.  No signs of neuropathy at this point. -Restaging CT abdomen pelvis from August 27, 2022 showed no residual disease.  -We again discussed maintenance therapy with Xeloda down the road, we will stop oxaliplatin when she develops side effects especially neuropathy, or after next scan -repeated staging CT from 11/26/2021 showed stable disease  -I have changed her treatment to maintenance Xeloda in early August 2024, she is tolerating well overall  -her NGS Caris showed positive Claudin 18.2, she is a  candidate for zolbetuximab.  -Repeated EGD on March 21, 2023 showed residual gastric cancer.  We discussed option of changing her chemotherapy back to FOLFOX and add zolbetuximab.  -PET 06/03/2023 showed stable disease -Patient developed recurrent abdominal pain, similar to the symptoms she had when she was diagnosed.  She agreed to change treatment back to FOLFOX, and add Zolbetuximab. -Initial treatment with FOLFOX and zolbetuximab was 07/09/2023.  She did experience significant nausea, vomiting, and poor appetite.  Today, she is getting IV fluids and IV antiemetics as needed.  The initial dose of zolbetuximab is higher than subsequent dosing so hopefully, future doses will not result in such significant side effects.  Second cycle has been delayed until 07/30/2023 in order to give her time to recover.  Oxaliplatin to be eliminated from cycle 2.  Consider adding this back if she tolerates cycle 2 better.

## 2023-07-24 ENCOUNTER — Inpatient Hospital Stay (HOSPITAL_BASED_OUTPATIENT_CLINIC_OR_DEPARTMENT_OTHER): Admitting: Nurse Practitioner

## 2023-07-24 ENCOUNTER — Inpatient Hospital Stay

## 2023-07-24 VITALS — BP 121/71 | HR 63 | Resp 18

## 2023-07-24 VITALS — BP 110/64 | HR 62 | Temp 97.7°F | Resp 20 | Ht 68.0 in | Wt 192.1 lb

## 2023-07-24 DIAGNOSIS — E876 Hypokalemia: Secondary | ICD-10-CM

## 2023-07-24 DIAGNOSIS — Z95828 Presence of other vascular implants and grafts: Secondary | ICD-10-CM

## 2023-07-24 DIAGNOSIS — C162 Malignant neoplasm of body of stomach: Secondary | ICD-10-CM

## 2023-07-24 DIAGNOSIS — Z5112 Encounter for antineoplastic immunotherapy: Secondary | ICD-10-CM | POA: Diagnosis not present

## 2023-07-24 LAB — CMP (CANCER CENTER ONLY)
ALT: 22 U/L (ref 0–44)
AST: 18 U/L (ref 15–41)
Albumin: 3.6 g/dL (ref 3.5–5.0)
Alkaline Phosphatase: 86 U/L (ref 38–126)
Anion gap: 6 (ref 5–15)
BUN: 15 mg/dL (ref 6–20)
CO2: 30 mmol/L (ref 22–32)
Calcium: 8.3 mg/dL — ABNORMAL LOW (ref 8.9–10.3)
Chloride: 103 mmol/L (ref 98–111)
Creatinine: 0.65 mg/dL (ref 0.44–1.00)
GFR, Estimated: >60 mL/min (ref >60)
Glucose, Bld: 140 mg/dL — ABNORMAL HIGH (ref 70–99)
Potassium: 3 mmol/L — ABNORMAL LOW (ref 3.5–5.1)
Sodium: 139 mmol/L (ref 135–145)
Total Bilirubin: 0.5 mg/dL (ref 0.0–1.2)
Total Protein: 5.7 g/dL — ABNORMAL LOW (ref 6.5–8.1)

## 2023-07-24 LAB — CBC WITH DIFFERENTIAL (CANCER CENTER ONLY)
Abs Immature Granulocytes: 0.01 10*3/uL (ref 0.00–0.07)
Basophils Absolute: 0 10*3/uL (ref 0.0–0.1)
Basophils Relative: 0 %
Eosinophils Absolute: 0 10*3/uL (ref 0.0–0.5)
Eosinophils Relative: 1 %
HCT: 38.7 % (ref 36.0–46.0)
Hemoglobin: 14.2 g/dL (ref 12.0–15.0)
Immature Granulocytes: 0 %
Lymphocytes Relative: 20 %
Lymphs Abs: 1 10*3/uL (ref 0.7–4.0)
MCH: 34.3 pg — ABNORMAL HIGH (ref 26.0–34.0)
MCHC: 36.7 g/dL — ABNORMAL HIGH (ref 30.0–36.0)
MCV: 93.5 fL (ref 80.0–100.0)
Monocytes Absolute: 0.7 10*3/uL (ref 0.1–1.0)
Monocytes Relative: 15 %
Neutro Abs: 3.2 10*3/uL (ref 1.7–7.7)
Neutrophils Relative %: 64 %
Platelet Count: 246 10*3/uL (ref 150–400)
RBC: 4.14 MIL/uL (ref 3.87–5.11)
RDW: 13.9 % (ref 11.5–15.5)
WBC Count: 4.9 10*3/uL (ref 4.0–10.5)
nRBC: 0 % (ref 0.0–0.2)

## 2023-07-24 MED ORDER — POTASSIUM CHLORIDE 10 MEQ/100ML IV SOLN
10.0000 meq | INTRAVENOUS | Status: AC
  Administered 2023-07-24 (×2): 10 meq via INTRAVENOUS
  Filled 2023-07-24 (×2): qty 100

## 2023-07-24 MED ORDER — ONDANSETRON HCL 4 MG/2ML IJ SOLN
8.0000 mg | Freq: Once | INTRAMUSCULAR | Status: AC
  Administered 2023-07-24: 8 mg via INTRAVENOUS
  Filled 2023-07-24: qty 4

## 2023-07-24 MED ORDER — SODIUM CHLORIDE 0.9% FLUSH
10.0000 mL | Freq: Once | INTRAVENOUS | Status: AC
  Administered 2023-07-24: 10 mL

## 2023-07-24 MED ORDER — SODIUM CHLORIDE 0.9 % IV SOLN: Freq: Once | INTRAVENOUS | Status: AC

## 2023-07-24 MED ORDER — SODIUM CHLORIDE 0.9 % IV SOLN
25.0000 mg | Freq: Once | INTRAVENOUS | Status: AC
  Administered 2023-07-24: 25 mg via INTRAVENOUS
  Filled 2023-07-24: qty 1

## 2023-07-24 MED ORDER — SODIUM CHLORIDE 0.9 % IV SOLN: Freq: Once | INTRAVENOUS | Status: DC

## 2023-07-24 NOTE — Patient Instructions (Signed)

## 2023-07-26 ENCOUNTER — Encounter

## 2023-07-26 ENCOUNTER — Encounter: Payer: Self-pay | Admitting: Hematology

## 2023-07-29 ENCOUNTER — Other Ambulatory Visit: Payer: Self-pay

## 2023-07-30 ENCOUNTER — Inpatient Hospital Stay

## 2023-07-30 ENCOUNTER — Inpatient Hospital Stay: Admitting: Hematology

## 2023-08-01 ENCOUNTER — Encounter: Payer: Self-pay | Admitting: Hematology

## 2023-08-01 ENCOUNTER — Encounter

## 2023-08-02 ENCOUNTER — Inpatient Hospital Stay: Attending: Physician Assistant

## 2023-08-02 ENCOUNTER — Encounter: Payer: Self-pay | Admitting: Hematology

## 2023-08-02 ENCOUNTER — Other Ambulatory Visit: Payer: Self-pay

## 2023-08-02 ENCOUNTER — Inpatient Hospital Stay (HOSPITAL_BASED_OUTPATIENT_CLINIC_OR_DEPARTMENT_OTHER): Admitting: Hematology

## 2023-08-02 VITALS — BP 130/94 | HR 65 | Temp 98.0°F | Resp 13 | Wt 195.2 lb

## 2023-08-02 DIAGNOSIS — Z5189 Encounter for other specified aftercare: Secondary | ICD-10-CM | POA: Diagnosis not present

## 2023-08-02 DIAGNOSIS — Z5112 Encounter for antineoplastic immunotherapy: Secondary | ICD-10-CM | POA: Insufficient documentation

## 2023-08-02 DIAGNOSIS — C169 Malignant neoplasm of stomach, unspecified: Secondary | ICD-10-CM | POA: Insufficient documentation

## 2023-08-02 DIAGNOSIS — C786 Secondary malignant neoplasm of retroperitoneum and peritoneum: Secondary | ICD-10-CM | POA: Insufficient documentation

## 2023-08-02 DIAGNOSIS — E876 Hypokalemia: Secondary | ICD-10-CM | POA: Diagnosis not present

## 2023-08-02 DIAGNOSIS — R112 Nausea with vomiting, unspecified: Secondary | ICD-10-CM | POA: Insufficient documentation

## 2023-08-02 DIAGNOSIS — C162 Malignant neoplasm of body of stomach: Secondary | ICD-10-CM | POA: Diagnosis not present

## 2023-08-02 DIAGNOSIS — Z5111 Encounter for antineoplastic chemotherapy: Secondary | ICD-10-CM | POA: Insufficient documentation

## 2023-08-02 DIAGNOSIS — Z95828 Presence of other vascular implants and grafts: Secondary | ICD-10-CM

## 2023-08-02 LAB — CBC WITH DIFFERENTIAL (CANCER CENTER ONLY)
Abs Immature Granulocytes: 0.01 10*3/uL (ref 0.00–0.07)
Basophils Absolute: 0 10*3/uL (ref 0.0–0.1)
Basophils Relative: 1 %
Eosinophils Absolute: 0.1 10*3/uL (ref 0.0–0.5)
Eosinophils Relative: 2 %
HCT: 38.2 % (ref 36.0–46.0)
Hemoglobin: 13.5 g/dL (ref 12.0–15.0)
Immature Granulocytes: 0 %
Lymphocytes Relative: 23 %
Lymphs Abs: 0.8 10*3/uL (ref 0.7–4.0)
MCH: 32.8 pg (ref 26.0–34.0)
MCHC: 35.3 g/dL (ref 30.0–36.0)
MCV: 92.7 fL (ref 80.0–100.0)
Monocytes Absolute: 0.6 10*3/uL (ref 0.1–1.0)
Monocytes Relative: 16 %
Neutro Abs: 2.1 10*3/uL (ref 1.7–7.7)
Neutrophils Relative %: 58 %
Platelet Count: 210 10*3/uL (ref 150–400)
RBC: 4.12 MIL/uL (ref 3.87–5.11)
RDW: 14.2 % (ref 11.5–15.5)
WBC Count: 3.7 10*3/uL — ABNORMAL LOW (ref 4.0–10.5)
nRBC: 0 % (ref 0.0–0.2)

## 2023-08-02 LAB — CMP (CANCER CENTER ONLY)
ALT: 30 U/L (ref 0–44)
AST: 33 U/L (ref 15–41)
Albumin: 3.6 g/dL (ref 3.5–5.0)
Alkaline Phosphatase: 87 U/L (ref 38–126)
Anion gap: 7 (ref 5–15)
BUN: 9 mg/dL (ref 6–20)
CO2: 29 mmol/L (ref 22–32)
Calcium: 8.7 mg/dL — ABNORMAL LOW (ref 8.9–10.3)
Chloride: 102 mmol/L (ref 98–111)
Creatinine: 0.67 mg/dL (ref 0.44–1.00)
GFR, Estimated: >60 mL/min (ref >60)
Glucose, Bld: 172 mg/dL — ABNORMAL HIGH (ref 70–99)
Potassium: 3.6 mmol/L (ref 3.5–5.1)
Sodium: 138 mmol/L (ref 135–145)
Total Bilirubin: 0.6 mg/dL (ref 0.0–1.2)
Total Protein: 5.9 g/dL — ABNORMAL LOW (ref 6.5–8.1)

## 2023-08-02 MED ORDER — HEPARIN SOD (PORK) LOCK FLUSH 100 UNIT/ML IV SOLN
500.0000 [IU] | Freq: Once | INTRAVENOUS | Status: AC
  Administered 2023-08-02: 500 [IU]

## 2023-08-02 MED ORDER — SODIUM CHLORIDE 0.9% FLUSH
10.0000 mL | Freq: Once | INTRAVENOUS | Status: AC
Start: 2023-08-02 — End: 2023-08-02
  Administered 2023-08-02: 10 mL

## 2023-08-02 NOTE — Assessment & Plan Note (Signed)
 [id number] with peritoneal metastasis. 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   -she started first line chemo FLOT on 1/10 -She understands that chemotherapy is palliative, to prolong her life.  We are unlikely going to cure her cancer. -PD-L1 0-1%, no significant benefit from PD-L1 immunotherapy, FO revealed FGFR2 amplification and fusion (+), FGFR inhibitors can be considered in future, no other targeted therapy available  -She has been tolerating chemo very well, will continue for now  -PET scan from 05/30/2022 was negative for primary tumor or metastatic disease, the known peritoneal mets did not show on PET.  -she has been tolerating chemo well overall. Due to fatigue, I have changed her chemo from FLOT to FOLFOX on 06/20/2022, she tolerated well -she previously asked the role of surgery, depends on her next restaging CT scan findings, I may refer her to [facility] or [facility] to discuss HIPEC surgery  -due to her infusion reaction to oxaliplatin on C5, we added additional premeds and gave slow infusion over 4 hours for cycle 6 and she tolerated well  -She is not able to return to work due to the cancer and treatment related symptoms.  -She is tolerating FOLFOX well overall, with moderate fatigue for a few days after infusion but able to recover well.  No signs of neuropathy at this point. -Restaging CT abdomen pelvis from August 27, 2022 showed no residual disease.  -We again discussed maintenance therapy with Xeloda down the road, we will stop oxaliplatin when she develops side effects especially neuropathy, or after next scan -repeated staging CT from 11/26/2021 showed stable disease  -I have changed her treatment to maintenance Xeloda in early August 2024, she is tolerating well overall  -her NGS Caris showed positive Claudin 18.2, she is a  candidate for zolbetuximab.  -Repeated EGD on March 21, 2023 showed residual gastric cancer.  We discussed option of changing her chemotherapy back to FOLFOX and add zolbetuximab.  -PET 06/03/2023 showed stable disease -Patient developed recurrent abdominal pain, similar to the symptoms she had when she was diagnosed.  She agreed to change treatment back to FOLFOX, and add Zolbetuximab. She started on 07/09/2023

## 2023-08-02 NOTE — Progress Notes (Signed)
 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   -she started first line chemo FLOT on 1/10 -She understands that chemotherapy is palliative, to prolong her life.  We are unlikely going to cure her cancer. -PD-L1 0-1%, no significant benefit from PD-L1 immunotherapy, FO revealed FGFR2 amplification and fusion (+), FGFR inhibitors can be considered in future, no other targeted therapy available  -She has been tolerating chemo very well, will continue for now  -PET scan from 05/30/2022 was negative for primary tumor or metastatic disease, the known peritoneal mets did not show on PET.  -she has been tolerating chemo well overall. Due to fatigue, I have changed her chemo from FLOT to FOLFOX on 06/20/2022, she tolerated well -she previously asked the role of surgery, depends on her next restaging CT scan findings, I may refer her to [facility] or [facility] to discuss HIPEC surgery  -due to her infusion reaction to oxaliplatin on C5, we added additional premeds and gave slow infusion over 4 hours for cycle 6 and she tolerated well  -She is not able to return to work due to the cancer and treatment related symptoms.  -She is tolerating FOLFOX well overall, with moderate fatigue for a few days after infusion but able to recover well.  No signs of neuropathy at this point. -Restaging CT abdomen pelvis from August 27, 2022 showed no residual disease.  -We again discussed maintenance therapy with Xeloda down the road, we will stop oxaliplatin when she develops side effects especially neuropathy, or after next scan -repeated staging CT from 11/26/2021 showed stable disease  -I have changed her treatment to maintenance Xeloda in early August 2024, she is tolerating well overall  -her NGS Caris showed positive Claudin 18.2, she is a candidate for zolbetuximab.  -Repeated EGD on March 21, 2023 showed residual gastric cancer.  We discussed option of changing her chemotherapy back to FOLFOX and add zolbetuximab.  -PET 06/03/2023 showed stable disease -Patient developed recurrent abdominal pain, similar to the symptoms she had when she was diagnosed.  She agreed to change treatment back to FOLFOX, and add Zolbetuximab. She started on 07/09/2023   Assessment and Plan    Gastric cancer Undergoing treatment with chemo and antibody therapy (Zolbe) for gastric cancer. 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 Reports improved well-being and weight gain since discontinuation. The first infusion of Xelbi was poorly tolerated due to a higher loading dose; subsequent infusions are expected to be better tolerated with lower doses. - Continue antibody therapy Isabell Jarvis) with next infusion scheduled for Wednesday. - Arrange for IV fluids on Friday after the infusion.  Acid reflux Experiencing acid reflux. Previously on Pepcid due to medication interactions. A stronger antacid is recommended following the discontinuation of the interacting medication. - Prescribe a stronger antacid medication.  Potassium management Potassium levels are at 3.6, on the low end of normal. Currently taking potassium supplements and consuming orange juice  to maintain levels. - Continue potassium supplementation with one pill in the morning and one at night. - Encourage continued consumption of orange  juice.  Follow-up Scheduled appointments for treatment and follow-up care. - Schedule treatment appointments for April 9, April 23, and May 7. - Ensure FMLA paperwork is processed and signed. - Provide a handicap sticker form for six months.         SUMMARY OF ONCOLOGIC HISTORY: Oncology History Overview Note   Cancer Staging  Gastric cancer [facility]) Staging form: Stomach, AJCC 8th Edition - Clinical stage from 04/19/2022: Stage IVB (cT2, cN0, pM1) - Signed by [name], MD on 05/08/2022 Total positive nodes: 0     Gastric cancer (HCC)  03/30/2022 Procedure   EGD:  Impression:  - Normal esophagus. - A few gastric polyps. Biopsied. - Gastritis. Biopsied. - Non-bleeding gastric ulcer with no stigmata of bleeding. Biopsied. - Normal examined duodenum. Biopsied.  Findings: Diffuse moderate inflammation characterized by congestion (edema), friability and granularity was found in the cardia, in the gastric fundus and in the gastric body. There were associated erosions in multiple places. Biopsies were taken from the antrum, body, and fundus with a cold forceps for histology. Estimated blood loss was minimal.  One non-bleeding cratered gastric ulcer with no stigmata of bleeding was found on the greater curvature of the stomach. The lesion was 6 mm in largest dimension. The mucosa around the ulcer was heaped and led to some deformity in the antrum. Biopsies were taken with a cold forceps for histology. Estimated blood loss was minimal.    03/30/2022 Pathology Results   Patient: MARYON, KEMNITZ  Accession: [id number]  Diagnosis 1. Surgical [P], duodenal - BENIGN SMALL BOWEL MUCOSA WITH NO SIGNIFICANT PATHOLOGIC CHANGES 2. Surgical [P], gastric antrum - GASTRIC ANTRAL MUCOSA WITH FEATURES OF REACTIVE GASTROPATHY - NEGATIVE FOR H. PYLORI ON H&E STAIN - NEGATIVE FOR INTESTINAL METAPLASIA OR MALIGNANCY 3. Surgical [P], gastric body - GASTRIC OXYNTIC MUCOSA WITH  REACTIVE/REPARATIVE CHANGES - NEGATIVE FOR H. PYLORI ON H&E STAIN - NEGATIVE FOR INTESTINAL METAPLASIA, DYSPLASIA OR MALIGNANCY 4. Surgical [P], greater curve ulceration - ADENOCARCINOMA WITH SIGNET RING CELL FEATURES (SEE NOTE) 5. Surgical [P], gastric polyps - ADENOCARCINOMA WITH SIGNET RING CELL FEATURES (SEE NOTE) 6. Surgical [P], fundus (gastric) - ADENOCARCINOMA WITH SIGNET RING CELL FEATURES (SEE NOTE) 7. Surgical [P], colon, ascending, polyp (1) - TUBULAR ADENOMA. - NO HIGH GRADE DYSPLASIA OR MALIGNANCY. 8. Surgical [P], colon, transverse, polyp (1) - TUBULAR ADENOMA. - NO HIGH GRADE DYSPLASIA OR MALIGNANCY.    04/13/2022 Initial Diagnosis   Gastric cancer (HCC)   04/19/2022 Cancer Staging   Staging form: Stomach, AJCC 8th Edition - Clinical stage from 04/19/2022: Stage IVB (cT2, cN0, pM1) - Signed by [name], MD on 05/08/2022 Total positive nodes: 0   05/05/2022 Genetic Testing   Negative genetic testing on the Multi-cancer gene panel + RNA.  FH c.259C>T VUS identified.  The report date is May 05, 2022.  The Multi-Cancer + RNA Panel offered by [facility] includes sequencing and/or deletion/duplication analysis of the following 70 genes:  AIP*, ALK, APC*, ATM*, AXIN2*, BAP1*, BARD1*, BLM*, BMPR1A*, BRCA1*, BRCA2*, BRIP1*, CDC73*, CDH1*, CDK4, CDKN1B*, CDKN2A, CHEK2*, CTNNA1*, DICER1*, EPCAM (del/dup only), EGFR, FH*, FLCN*, GREM1 (promoter dup only), HOXB13, KIT, LZTR1, MAX*, MBD4, MEN1*, MET, MITF, MLH1*, MSH2*, MSH3*, MSH6*, MUTYH*, NF1*, NF2*, NTHL1*, PALB2*, PDGFRA, PMS2*, POLD1*, POLE*, POT1*, PRKAR1A*, PTCH1*, PTEN*, RAD51C*, RAD51D*, RB1*, RET, SDHA* (sequencing only), SDHAF2*, SDHB*, SDHC*, SDHD*, SMAD4*, SMARCA4*, SMARCB1*, SMARCE1*,  STK11*, SUFU*, GLOV564*, TP53*, TSC1*, TSC2*, VHL*. RNA analysis is performed for * genes.    05/09/2022 - 06/07/2022 Chemotherapy   Patient is on Treatment Plan : GASTROESOPHAGEAL FLOT q14d X 4 cycles      Miscellaneous   Foundation  One  Biomarker Findings Microsatellite status- Cannot be determined Tumor Mutational Burden- Cannot be determined  Genomic Findings  FGFR2 amplification,FGFR2-TACC2 fusion,  Rearrangement intron 17 ARAF amplification CCND3 amplification TP53 [id number]*74     05/30/2022 Imaging    IMPRESSION: 1. Mild hypermetabolism corresponding to a dominant left upper quadrant mass and smaller perigastric nodules or nodes. 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 Recommend attention to the dominant left upper quadrant soft tissue mass on follow-up exams to exclude unlikely recurrent lymphoma. 2. No gastric hypermetabolism and no typical findings of metastatic disease.   06/20/2022 - 11/16/2022 Chemotherapy   Patient is on Treatment Plan : GASTRIC FOLFOX q14d x 12 cycles     08/27/2022 Imaging    IMPRESSION: No focal gastric mass on CT.   No findings suspicious for recurrent or metastatic disease.   Stable left upper abdominal soft tissue lesion and small lymph nodes, chronic, favoring treated lymphoma.   11/27/2022 Imaging    IMPRESSION: 1. Questionable thickening of the distal esophagus/GE junction and gastric antrum, consider further evaluation with endoscopy. 2. Chronically stable left upper quadrant nodularity and prominent lymph nodes again favored treated lymphoma. Continued attention on follow-up imaging suggested. 3. No convincing evidence of metastatic disease in the chest, abdomen or pelvis. 4. Questionable asymmetric wall thickening of the rectum, consider further evaluation with colonoscopy. 5. Mild wall thickening of a nondistended urinary bladder, correlate with urinalysis to exclude cystitis. 6. Hepatic steatosis.   07/10/2023 -  Chemotherapy   Patient is on Treatment Plan : GASTROESOPHAGEAL Zolbetuximab (800/400) + FOLFOX D1,15,29 q42d x 4 cycles / Zolbetuximab (400) + 5FU + Leucovorin D1,15,29 q42d        Discussed the use of  AI scribe software for clinical note transcription with the patient, who gave verbal consent to proceed.  History of Present Illness   The patient, with a known diagnosis of gastric cancer, presents for a follow-up visit after a recent consultation at [facility]. She reports experiencing neuropathy and changes in taste, which she attributes to her current medication regimen. She notes that she has been advised to discontinue the medication causing these side effects. Despite these challenges, the patient reports feeling "great" in the past few days and has been able to eat "a little bit here and there." She acknowledges that her appetite is not fully restored but is gradually improving. The patient also mentions that she has been taking potassium supplements and drinking orange juice to manage her potassium levels.         All other systems were reviewed with the patient and are negative.  MEDICAL HISTORY:  Past Medical History:  Diagnosis Date   Blood transfusion without reported diagnosis    had transfusion with hysterectomy   Cataract    Colon polyps 2012   Diabetes (HCC) 03/13/2021   Diabetes (HCC) 05/21/2019   Family history of breast cancer    Family history of pancreatic cancer    Family history of stomach cancer    Fibroid    gastric ca 03/2022   GERD (gastroesophageal reflux disease)    H/O blood clots    History of hysterectomy    fibroids and heavy cycles   Hypertension  SURGICAL HISTORY: Past Surgical History:  Procedure Laterality Date   ABDOMINAL HYSTERECTOMY     BIOPSY  04/19/2022   Procedure: BIOPSY;  Surgeon: Meridee Score Netty Starring., MD;  Location: [facility];  Service: Gastroenterology;;   BIOPSY  03/21/2023   Procedure: BIOPSY;  Surgeon: Lemar Lofty., MD;  Location: [facility];  Service: Gastroenterology;;   COLONOSCOPY     ESOPHAGOGASTRODUODENOSCOPY (EGD) WITH PROPOFOL N/A 04/19/2022   Procedure: ESOPHAGOGASTRODUODENOSCOPY (EGD) WITH PROPOFOL;   Surgeon: Lemar Lofty., MD;  Location: [facility];  Service: Gastroenterology;  Laterality: N/A;   ESOPHAGOGASTRODUODENOSCOPY (EGD) WITH PROPOFOL N/A 03/21/2023   Procedure: ESOPHAGOGASTRODUODENOSCOPY (EGD) WITH PROPOFOL;  Surgeon: Meridee Score Netty Starring., MD;  Location: [facility];  Service: Gastroenterology;  Laterality: N/A;   EUS N/A 04/19/2022   Procedure: UPPER ENDOSCOPIC ULTRASOUND (EUS) RADIAL;  Surgeon: Lemar Lofty., MD;  Location: [facility];  Service: Gastroenterology;  Laterality: N/A;   EXCISION OF SKIN TAG  05/03/2022   Procedure: EXCISION OF CHEST WALL SKIN LESION;  Surgeon: Fritzi Mandes, MD;  Location: [facility];  Service: General;;   LAPAROSCOPY N/A 05/03/2022   Procedure: LAPAROSCOPY DIAGNOSTIC WITH PERITONEAL WASHINGS;  Surgeon: Fritzi Mandes, MD;  Location: [facility];  Service: General;  Laterality: N/A;   POLYPECTOMY  04/19/2022   Procedure: POLYPECTOMY;  Surgeon: Lemar Lofty., MD;  Location: [facility];  Service: Gastroenterology;;   PORTACATH PLACEMENT N/A 05/03/2022   Procedure: INSERTION PORT-A-CATH WITH ULTRASOUND GUIDANCE;  Surgeon: Fritzi Mandes, MD;  Location: [facility];  Service: General;  Laterality: N/A;   UPPER GASTROINTESTINAL ENDOSCOPY      I have reviewed the social history and family history with the patient and they are unchanged from previous note.  ALLERGIES:  is allergic to aspirin, cyclobenzaprine, naproxen sodium, zithromax [azithromycin dihydrate], oxaliplatin, and dilaudid [hydromorphone].  MEDICATIONS:  Current Outpatient Medications  Medication Sig Dispense Refill   acetaminophen (TYLENOL) 500 MG tablet Take 2 tablets (1,000 mg total) by mouth every 8 (eight) hours as needed (pain). 30 tablet 1   amLODipine (NORVASC) 10 MG tablet Take 1 tablet (10 mg total) by mouth daily. 30 tablet 2   b complex vitamins capsule Take 1 capsule by mouth daily.     Cyanocobalamin (VITAMIN B 12 PO) Take by mouth.     dexamethasone  (DECADRON) 4 MG tablet Take 2 tabs daily for 2-3 days then 1 tab daily for additional 2-4 days, total no more than 7 days 10 tablet 0   famotidine (PEPCID) 20 MG tablet Take 1 tablet (20 mg total) by mouth 2 (two) times daily. 60 tablet 2   HYDROcodone-acetaminophen (NORCO/VICODIN) 5-325 MG tablet Take 1 tablet by mouth every 6 (six) hours as needed for moderate pain (pain score 4-6). 20 tablet 0   lidocaine-prilocaine (EMLA) cream Apply 1 Application topically as needed. 30 g 1   ondansetron (ZOFRAN) 8 MG tablet Take 1 tablet (8 mg total) by mouth every 8 (eight) hours as needed for nausea or vomiting. 30 tablet 2   POTASSIUM PO Take 99 mg by mouth daily.     prochlorperazine (COMPAZINE) 10 MG tablet Take 1 tablet (10 mg total) by mouth every 6 (six) hours as needed for nausea or vomiting. 30 tablet 2   scopolamine (TRANSDERM-SCOP) 1 MG/3DAYS Place 1 patch (1.5 mg total) onto the skin every 3 (three) days. 10 patch 1   sucralfate (CARAFATE) 1 g tablet Take 1 tablet (1 g total) by mouth 2 (two) times daily. 60 tablet  6   No current facility-administered medications for this visit.    PHYSICAL EXAMINATION: ECOG PERFORMANCE STATUS: 1 - Symptomatic but completely ambulatory  Vitals:   08/02/23 1048  BP: (!) 130/94  Pulse: 65  Resp: 13  Temp: 98 F (36.7 C)  SpO2: 100%   Wt Readings from Last 3 Encounters:  08/02/23 195 lb 3.2 oz (88.5 kg)  07/24/23 192 lb 1.6 oz (87.1 kg)  07/17/23 209 lb 7 oz (95 kg)     GENERAL:alert, no distress and comfortable SKIN: skin color, texture, turgor are normal, no rashes or significant lesions EYES: normal, Conjunctiva are pink and non-injected, sclera clear NECK: supple, thyroid normal size, non-tender, without nodularity LYMPH:  no palpable lymphadenopathy in the cervical, axillary  LUNGS: clear to auscultation and percussion with normal breathing effort HEART: regular rate & rhythm and no murmurs and no lower extremity edema ABDOMEN:abdomen soft,  non-tender and normal bowel sounds Musculoskeletal:no cyanosis of digits and no clubbing  NEURO: alert & oriented x 3 with fluent speech, no focal motor/sensory deficits    LABORATORY DATA:  I have reviewed the data as listed    Latest Ref Rng & Units 08/02/2023    9:16 AM 07/24/2023    8:22 AM 07/18/2023    1:00 AM  CBC  WBC 4.0 - 10.5 K/uL 3.7  4.9  3.7   Hemoglobin 12.0 - 15.0 g/dL 13.0  86.5  78.4   Hematocrit 36.0 - 46.0 % 38.2  38.7  38.4   Platelets 150 - 400 K/uL 210  246  212         Latest Ref Rng & Units 08/02/2023    9:16 AM 07/24/2023    8:22 AM 07/18/2023    1:00 AM  CMP  Glucose 70 - 99 mg/dL 696  295  284   BUN 6 - 20 mg/dL 9  15  9    Creatinine 0.44 - 1.00 mg/dL 1.32  4.40  1.02   Sodium 135 - 145 mmol/L 138  139  134   Potassium 3.5 - 5.1 mmol/L 3.6  3.0  2.9   Chloride 98 - 111 mmol/L 102  103  99   CO2 22 - 32 mmol/L 29  30  27    Calcium 8.9 - 10.3 mg/dL 8.7  8.3  8.0   Total Protein 6.5 - 8.1 g/dL 5.9  5.7  5.7   Total Bilirubin 0.0 - 1.2 mg/dL 0.6  0.5  0.7   Alkaline Phos 38 - 126 U/L 87  86  75   AST 15 - 41 U/L 33  18  24   ALT 0 - 44 U/L 30  22  26        RADIOGRAPHIC STUDIES: I have personally reviewed the radiological images as listed and agreed with the findings in the report. No results found.    No orders of the defined types were placed in this encounter.  All questions were answered. The patient knows to call the clinic with any problems, questions or concerns. No barriers to learning was detected. The total time spent in the appointment was 25 minutes.     [name], MD 08/02/2023

## 2023-08-03 ENCOUNTER — Other Ambulatory Visit: Payer: Self-pay

## 2023-08-06 ENCOUNTER — Inpatient Hospital Stay

## 2023-08-06 ENCOUNTER — Inpatient Hospital Stay: Admitting: Hematology

## 2023-08-06 ENCOUNTER — Inpatient Hospital Stay: Admitting: Dietician

## 2023-08-06 ENCOUNTER — Inpatient Hospital Stay: Attending: Physician Assistant

## 2023-08-06 ENCOUNTER — Ambulatory Visit

## 2023-08-06 MED FILL — Fosaprepitant Dimeglumine For IV Infusion 150 MG (Base Eq): INTRAVENOUS | Qty: 5 | Status: AC

## 2023-08-07 ENCOUNTER — Inpatient Hospital Stay

## 2023-08-07 ENCOUNTER — Encounter: Payer: Self-pay | Admitting: Hematology

## 2023-08-07 VITALS — BP 128/78 | HR 62 | Temp 98.0°F | Resp 18 | Wt 195.0 lb

## 2023-08-07 DIAGNOSIS — C162 Malignant neoplasm of body of stomach: Secondary | ICD-10-CM

## 2023-08-07 DIAGNOSIS — Z5112 Encounter for antineoplastic immunotherapy: Secondary | ICD-10-CM | POA: Diagnosis not present

## 2023-08-07 MED ORDER — PROCHLORPERAZINE MALEATE 10 MG PO TABS
5.0000 mg | ORAL_TABLET | Freq: Four times a day (QID) | ORAL | Status: DC | PRN
  Administered 2023-08-07: 5 mg via ORAL
  Filled 2023-08-07: qty 1

## 2023-08-07 MED ORDER — SODIUM CHLORIDE 0.9 % IV SOLN
2400.0000 mg/m2 | INTRAVENOUS | Status: DC
  Administered 2023-08-07: 5000 mg via INTRAVENOUS
  Filled 2023-08-07: qty 100

## 2023-08-07 MED ORDER — ZOLBETUXIMAB-CLZB CHEMO 100MG/5ML IV SOLN
380.0000 mg/m2 | Freq: Once | INTRAVENOUS | Status: AC
  Administered 2023-08-07: 800 mg via INTRAVENOUS
  Filled 2023-08-07: qty 40

## 2023-08-07 MED ORDER — FAMOTIDINE IN NACL 20-0.9 MG/50ML-% IV SOLN
20.0000 mg | Freq: Once | INTRAVENOUS | Status: AC
  Administered 2023-08-07: 20 mg via INTRAVENOUS
  Filled 2023-08-07: qty 50

## 2023-08-07 MED ORDER — LEUCOVORIN CALCIUM INJECTION 350 MG
400.0000 mg/m2 | Freq: Once | INTRAMUSCULAR | Status: DC
  Filled 2023-08-07: qty 42.2

## 2023-08-07 MED ORDER — LORAZEPAM 2 MG/ML IJ SOLN
0.5000 mg | Freq: Once | INTRAMUSCULAR | Status: AC | PRN
  Administered 2023-08-07: 0.5 mg via INTRAVENOUS
  Filled 2023-08-07: qty 1

## 2023-08-07 MED ORDER — SODIUM CHLORIDE 0.9 % IV SOLN: INTRAVENOUS | Status: DC

## 2023-08-07 MED ORDER — SODIUM CHLORIDE 0.9 % IV SOLN
400.0000 mg/m2 | Freq: Once | INTRAVENOUS | Status: AC
  Administered 2023-08-07: 844 mg via INTRAVENOUS
  Filled 2023-08-07: qty 42.2

## 2023-08-07 MED ORDER — FOSAPREPITANT DIMEGLUMINE INJECTION 150 MG
150.0000 mg | Freq: Once | INTRAVENOUS | Status: AC
  Administered 2023-08-07: 150 mg via INTRAVENOUS
  Filled 2023-08-07: qty 150

## 2023-08-07 MED ORDER — DEXAMETHASONE SODIUM PHOSPHATE 10 MG/ML IJ SOLN
10.0000 mg | Freq: Once | INTRAMUSCULAR | Status: AC
  Administered 2023-08-07: 10 mg via INTRAVENOUS
  Filled 2023-08-07: qty 1

## 2023-08-07 MED ORDER — DIPHENHYDRAMINE HCL 25 MG PO CAPS
25.0000 mg | ORAL_CAPSULE | Freq: Once | ORAL | Status: AC
  Administered 2023-08-07: 25 mg via ORAL
  Filled 2023-08-07: qty 1

## 2023-08-07 MED ORDER — PROCHLORPERAZINE EDISYLATE 10 MG/2ML IJ SOLN
10.0000 mg | INTRAMUSCULAR | Status: DC | PRN
Start: 2023-08-07 — End: 2023-08-07

## 2023-08-07 MED ORDER — PALONOSETRON HCL INJECTION 0.25 MG/5ML
0.2500 mg | Freq: Once | INTRAVENOUS | Status: AC
  Administered 2023-08-07: 0.25 mg via INTRAVENOUS
  Filled 2023-08-07: qty 5

## 2023-08-07 MED ORDER — OLANZAPINE 5 MG PO TABS
5.0000 mg | ORAL_TABLET | Freq: Once | ORAL | Status: AC
  Administered 2023-08-07: 5 mg via ORAL
  Filled 2023-08-07: qty 1

## 2023-08-07 NOTE — Patient Instructions (Signed)
 [facility] - A DEPT OF [facility]  Discharge Instructions: Thank you for choosing [facility] to provide your oncology and hematology care.   If you have a lab appointment with the Cancer Center, please go directly to the Cancer Center and check in at the registration area.   Wear comfortable clothing and clothing appropriate for easy access to any Portacath or PICC line.   We strive to give you quality time with your provider. You may need to reschedule your appointment if you arrive late (15 or more minutes).  Arriving late affects you and other patients whose appointments are after yours.  Also, if you miss three or more appointments without notifying the office, you may be dismissed from the clinic at the provider's discretion.      For prescription refill requests, have your pharmacy contact our office and allow 72 hours for refills to be completed.    Today you received the following chemotherapy and/or immunotherapy agents zolbetuximab (Vyloy), Adrucil       To help prevent nausea and vomiting after your treatment, we encourage you to take your nausea medication as directed.  BELOW ARE SYMPTOMS THAT SHOULD BE REPORTED IMMEDIATELY: *FEVER GREATER THAN 100.4 F (38 C) OR HIGHER *CHILLS OR SWEATING *NAUSEA AND VOMITING THAT IS NOT CONTROLLED WITH YOUR NAUSEA MEDICATION *UNUSUAL SHORTNESS OF BREATH *UNUSUAL BRUISING OR BLEEDING *URINARY PROBLEMS (pain or burning when urinating, or frequent urination) *BOWEL PROBLEMS (unusual diarrhea, constipation, pain near the anus) TENDERNESS IN MOUTH AND THROAT WITH OR WITHOUT PRESENCE OF ULCERS (sore throat, sores in mouth, or a toothache) UNUSUAL RASH, SWELLING OR PAIN  UNUSUAL VAGINAL DISCHARGE OR ITCHING   Items with * indicate a potential emergency and should be followed up as soon as possible or go to the Emergency Department if any problems should occur.  Please show the CHEMOTHERAPY ALERT  CARD or IMMUNOTHERAPY ALERT CARD at check-in to the Emergency Department and triage nurse.  Should you have questions after your visit or need to cancel or reschedule your appointment, please contact [facility] - A DEPT OF [facility]  Dept: [phone]  and follow the prompts.  Office hours are 8:00 a.m. to 4:30 p.m. Monday - Friday. Please note that voicemails left after 4:00 p.m. may not be returned until the following business day.  We are closed weekends and major holidays. You have access to a nurse at all times for urgent questions. Please call the main number to the clinic Dept: [phone] and follow the prompts.   For any non-urgent questions, you may also contact your provider using MyChart. We now offer e-Visits for anyone 64 and older to request care online for non-urgent symptoms. For details visit [email or web address].   Also download the MyChart app! Go to the app store, search "MyChart", open the app, select [place], and log in with your MyChart username and password.

## 2023-08-08 ENCOUNTER — Other Ambulatory Visit

## 2023-08-08 ENCOUNTER — Ambulatory Visit

## 2023-08-08 ENCOUNTER — Encounter

## 2023-08-08 ENCOUNTER — Ambulatory Visit: Admitting: Hematology

## 2023-08-09 ENCOUNTER — Inpatient Hospital Stay: Admitting: Licensed Clinical Social Worker

## 2023-08-09 ENCOUNTER — Inpatient Hospital Stay

## 2023-08-09 ENCOUNTER — Other Ambulatory Visit: Payer: Self-pay

## 2023-08-09 VITALS — BP 137/91 | HR 66 | Temp 98.4°F | Resp 16 | Ht 68.0 in | Wt 196.4 lb

## 2023-08-09 DIAGNOSIS — Z95828 Presence of other vascular implants and grafts: Secondary | ICD-10-CM

## 2023-08-09 DIAGNOSIS — Z5112 Encounter for antineoplastic immunotherapy: Secondary | ICD-10-CM | POA: Diagnosis not present

## 2023-08-09 DIAGNOSIS — C162 Malignant neoplasm of body of stomach: Secondary | ICD-10-CM

## 2023-08-09 MED ORDER — SODIUM CHLORIDE 0.9% FLUSH
3.0000 mL | Freq: Once | INTRAVENOUS | Status: DC | PRN
Start: 2023-08-09 — End: 2023-08-09

## 2023-08-09 MED ORDER — SODIUM CHLORIDE 0.9 % IV SOLN
25.0000 mg | Freq: Once | INTRAVENOUS | Status: AC
  Administered 2023-08-09: 25 mg via INTRAVENOUS
  Filled 2023-08-09: qty 1

## 2023-08-09 MED ORDER — SODIUM CHLORIDE 0.9 % IV SOLN
Freq: Once | INTRAVENOUS | Status: AC
Start: 2023-08-09 — End: 2023-08-09

## 2023-08-09 MED ORDER — SODIUM CHLORIDE 0.9% FLUSH
10.0000 mL | Freq: Once | INTRAVENOUS | Status: AC | PRN
  Administered 2023-08-09: 10 mL

## 2023-08-09 MED ORDER — HEPARIN SOD (PORK) LOCK FLUSH 100 UNIT/ML IV SOLN
250.0000 [IU] | Freq: Once | INTRAVENOUS | Status: DC | PRN
Start: 2023-08-09 — End: 2023-08-09

## 2023-08-09 MED ORDER — ALTEPLASE 2 MG IJ SOLR: 2.0000 mg | Freq: Once | INTRAMUSCULAR | Status: DC | PRN

## 2023-08-09 MED ORDER — HEPARIN SOD (PORK) LOCK FLUSH 100 UNIT/ML IV SOLN
500.0000 [IU] | Freq: Once | INTRAVENOUS | Status: AC | PRN
  Administered 2023-08-09: 500 [IU]

## 2023-08-09 NOTE — Patient Instructions (Addendum)
 Rehydration, Adult Rehydration is the replacement of fluids, salts, and minerals in the body (electrolytes) that are lost during dehydration. Dehydration is when there is not enough water or other fluids in the body. This happens when you lose more fluids than you take in. Common causes of dehydration include: Not drinking enough fluids. This can occur when you are ill or doing activities that require a lot of energy, especially in hot weather. Conditions that cause loss of water or other fluids. These include diarrhea, vomiting, sweating, and urinating a lot. Other illnesses, such as fever or infection. Certain medicines, such as those that remove excess fluid from the body (diuretics). Symptoms of mild or moderate dehydration may include thirst, dry lips and mouth, and dizziness. Symptoms of severe dehydration may include increased heart rate, confusion, fainting, and not urinating. In severe cases, you may need to get fluids through an IV at the hospital. For mild or moderate cases, you can usually rehydrate at home by drinking certain fluids as told by your health care provider. What are the risks? Your health care provider will talk with you about risks. Your health care provider will talk with you about risks. This may include taking in too much fluid (overhydration). This is rare. Overhydration can cause an imbalance of electrolytes in the body, kidney failure, or a decrease in salt (sodium) levels in the body. Supplies needed: You will need an oral rehydration solution (ORS) if your health care provider tells you to use one. This is a drink to treat dehydration. It can be found in pharmacies and retail stores. How to rehydrate Fluids Follow instructions from your health care provider about what to drink. The kind of fluid and the amount you should drink depend on your condition. In general, you should choose drinks that you prefer. If told by your health care provider, drink an ORS. Make an  ORS by following instructions on the package. Start by drinking small amounts, about  cup (120 mL) every 5-10 minutes. Slowly increase how much you drink until you have taken in the amount recommended by your health care provider. Drink enough clear fluids to keep your urine pale yellow. If you were told to drink an ORS, finish it first, then start slowly drinking other clear fluids. Drink fluids such as: Water. This includes sparkling and flavored water. Drinking only water can lead to having too little sodium in your body (hyponatremia). Follow the advice of your health care provider. Water from ice chips you suck on. Fruit juice with water added to it (diluted). Sports drinks. Hot or cold herbal teas. Broth-based soups. Milk or milk products. Food Follow instructions from your health care provider about what to eat while you rehydrate. Your health care provider may recommend that you slowly begin eating regular foods in small amounts. Eat foods that contain a healthy balance of electrolytes, such as bananas, oranges, potatoes, tomatoes, and spinach. Avoid foods that are greasy or contain a lot of sugar. In some cases, you may get nutrition through a feeding tube that is passed through your nose and into your stomach (nasogastric tube, or NG tube). This may be done if you have uncontrolled vomiting or diarrhea. Drinks to avoid  Certain drinks may make dehydration worse. While you rehydrate, avoid drinking alcohol. How to tell if you are recovering from dehydration You may be getting better if: You are urinating more often than before you started rehydrating. Your urine is pale yellow. Your energy level improves. You vomit less  often. You have diarrhea less often. Your appetite improves or returns to normal. You feel less dizzy or light-headed. Your skin tone and color start to look more normal. Follow these instructions at home: Take over-the-counter and prescription medicines only  as told by your health care provider. Do not take sodium tablets. Doing this can lead to having too much sodium in your body (hypernatremia). Contact a health care provider if: You continue to have symptoms of mild or moderate dehydration, such as: Thirst. Dry lips. Slightly dry mouth. Dizziness. Dark urine or less urine than normal. Muscle cramps. You continue to vomit or have diarrhea. Get help right away if: You have symptoms of dehydration that get worse. You have a fever. You have a severe headache. You have been vomiting and have problems, such as: Your vomiting gets worse or does not go away. Your vomit includes blood or green matter (bile). You cannot eat or drink without vomiting. You have problems with urination or bowel movements, such as: Diarrhea that gets worse or does not go away. Blood in your stool (feces). This may cause stool to look black and tarry. Not urinating, or urinating only a small amount of very dark urine, within 6-8 hours. You have trouble breathing. You have symptoms that get worse with treatment. These symptoms may be an emergency. Get help right away. Call 911. Do not wait to see if the symptoms will go away. Do not drive yourself to the hospital. This information is not intended to replace advice given to you by your health care provider. Make sure you discuss any questions you have with your health care provider.  Nausea, Adult Nausea is the feeling of having an upset stomach or that you are about to vomit. Nausea on its own is not usually a serious concern, but it may be an early sign of a more serious medical problem. As nausea gets worse, it can lead to vomiting. If vomiting develops, or if you are not able to drink enough fluids, you are at risk of becoming dehydrated. Dehydration can make you tired and thirsty, cause you to have a dry mouth, and decrease how often you urinate. Older adults and people with other diseases or a weak  disease-fighting system (immune system) are at higher risk for dehydration. The main goals of treating your nausea are: To relieve your nausea. To limit repeated nausea episodes. To prevent vomiting and dehydration. Follow these instructions at home: Watch your symptoms for any changes. Tell your health care provider about them. Eating and drinking     Take an oral rehydration solution (ORS). This is a drink that is sold at pharmacies and retail stores. Drink clear fluids slowly and in small amounts as you are able. Clear fluids include water, ice chips, low-calorie sports drinks, and fruit juice that has water added (diluted fruit juice). Eat bland, easy-to-digest foods in small amounts as you are able. These foods include bananas, applesauce, rice, lean meats, toast, and crackers. Avoid drinking fluids that contain a lot of sugar or caffeine, such as energy drinks, sports drinks, and soda. Avoid alcohol. Avoid spicy or fatty foods. General instructions Take over-the-counter and prescription medicines only as told by your health care provider. Rest at home while you recover. Drink enough fluid to keep your urine pale yellow. Breathe slowly and deeply when you feel nauseous. Avoid smelling things that have strong odors. Wash your hands often using soap and water for at least 20 seconds. If soap and water are  not available, use hand sanitizer. Make sure that everyone in your household washes their hands well and often. Keep all follow-up visits. This is important. Contact a health care provider if: Your nausea gets worse. Your nausea does not go away after two days. You vomit multiple times. You cannot drink fluids without vomiting. You have any of the following: New symptoms. A fever. A headache. Muscle cramps. A rash. Pain while urinating. You feel light-headed or dizzy. Get help right away if: You have pain in your chest, neck, arm, or jaw. You feel extremely weak or you  faint. You have vomit that is bright red or looks like coffee grounds. You have bloody or black stools (feces) or stools that look like tar. You have a severe headache, a stiff neck, or both. You have severe pain, cramping, or bloating in your abdomen. You have difficulty breathing or are breathing very quickly. Your heart is beating very quickly. Your skin feels cold and clammy. You feel confused. You have signs of dehydration, such as: Dark urine, very little urine, or no urine. Cracked lips. Dry mouth. Sunken eyes. Sleepiness. Weakness. These symptoms may be an emergency. Get help right away. Call 911. Do not wait to see if the symptoms will go away. Do not drive yourself to the hospital. Summary Nausea is the feeling that you have an upset stomach or that you are about to vomit. Nausea on its own is not usually a serious concern, but it may be an early sign of a more serious medical problem. If vomiting develops, or if you are not able to drink enough fluids, you are at risk of becoming dehydrated. Follow recommendations for eating and drinking and take over-the-counter and prescription medicines only as told by your health care provider. Contact a health care provider right away if your symptoms worsen or you have new symptoms. Keep all follow-up visits. This is important. This information is not intended to replace advice given to you by your health care provider. Make sure you discuss any questions you have with your health care provider. Document Revised: 10/21/2020 Document Reviewed: 10/21/2020 Elsevier Patient Education  2024 [employer] Document Revised: 08/28/2021 Document Reviewed: 08/28/2021 Elsevier Patient Education  2024 [employer].

## 2023-08-09 NOTE — Progress Notes (Signed)
 [facility] Healthcare Advance Directives Clinical Social Work  Patient presented to [facility]  to review and complete healthcare advance directives.  Clinical Social Worker met with patient and son.  The patient designated Michel Harrow [phone]) as their primary healthcare agent and Demontae Pettiford [phone]) as their secondary agent.  Patient also completed healthcare living will.    Documents were notarized and copies made for patient/family. Clinical Social Worker will send documents to medical records to be scanned into patient's chart. Clinical Social Worker encouraged patient/family to contact with any additional questions or concerns.   Rachel Moulds, LCSW Clinical Social Worker [facility]        Patient is participating in a Managed Medicaid Plan:  Yes

## 2023-08-10 ENCOUNTER — Encounter

## 2023-08-13 ENCOUNTER — Other Ambulatory Visit

## 2023-08-13 ENCOUNTER — Ambulatory Visit: Admitting: Hematology

## 2023-08-13 ENCOUNTER — Ambulatory Visit

## 2023-08-14 ENCOUNTER — Other Ambulatory Visit

## 2023-08-14 ENCOUNTER — Telehealth: Payer: Self-pay

## 2023-08-14 ENCOUNTER — Other Ambulatory Visit: Payer: Self-pay | Admitting: Hematology

## 2023-08-14 ENCOUNTER — Other Ambulatory Visit: Payer: Self-pay

## 2023-08-14 ENCOUNTER — Encounter: Payer: Self-pay | Admitting: Hematology

## 2023-08-14 ENCOUNTER — Ambulatory Visit: Admitting: Hematology

## 2023-08-14 ENCOUNTER — Ambulatory Visit

## 2023-08-14 DIAGNOSIS — R112 Nausea with vomiting, unspecified: Secondary | ICD-10-CM

## 2023-08-14 MED ORDER — PROMETHAZINE HCL 25 MG PO TABS
25.0000 mg | ORAL_TABLET | Freq: Four times a day (QID) | ORAL | 0 refills | Status: DC | PRN
  Filled 2023-08-14: qty 30, 8d supply, fill #0

## 2023-08-14 NOTE — Telephone Encounter (Signed)
 Pt called stating she's had n/v since 08/13/2023.  Pt stated every time she tries to eat or drink she vomits.  Pt stated she's taking Compazine for nausea but it's not working.  Pt denied taking the Ondansetron (Zofran).  Instructed pt to take the Zofran as well now since it's been 3+ days since her chemo tx.  Instructed pt to alternate/stagger the antiemetics by taking ondansetron and if the pt's nausea does not improve after 1.5hrs take the compazine. Pt verbalized understanding and stated she would start taking the Zofran.  Instructed pt to give stomach a rest by eating small meals of bland food.  Also, instructed pt to take several sips of water, Pedialyte, Gatorade, or Liquid IV continuously throughout the day to prevent dehydration.  Stated this nurse will see if [facility] can give hydration sometime this week.  Pt confirmed she's taking Famotidine 40mg  daily.  Pt verbalized understanding and stated she would await this nurses call.  Notified Dr. Maryalice Smaller and [facility] of the pt's call.

## 2023-08-14 NOTE — Telephone Encounter (Signed)
 This RN called patient in regards to N/V to set up an appt in [facility] this week. Pt stated "I have been taking the medications like Abbie Abbey told me too and already feel better, I am keep things down, and tomorrow is not a good day for me". This RN offered an appt on Friday 08/16/23 at 9:00 AM. Pt agreed to come in for IVFs on Friday. This RN informed Pt to reach out if N/V don't improve or worsen before her appt. Pt verbalized understanding and was agreeable with plan.

## 2023-08-15 ENCOUNTER — Other Ambulatory Visit: Payer: Self-pay

## 2023-08-16 ENCOUNTER — Inpatient Hospital Stay

## 2023-08-16 ENCOUNTER — Inpatient Hospital Stay: Admitting: Physician Assistant

## 2023-08-16 ENCOUNTER — Other Ambulatory Visit: Payer: Self-pay

## 2023-08-16 ENCOUNTER — Encounter

## 2023-08-16 VITALS — BP 123/98 | HR 82 | Temp 97.6°F | Resp 16 | Wt 198.1 lb

## 2023-08-16 VITALS — BP 127/80 | HR 84 | Temp 97.6°F | Resp 16 | Ht 68.0 in | Wt 189.1 lb

## 2023-08-16 DIAGNOSIS — C162 Malignant neoplasm of body of stomach: Secondary | ICD-10-CM | POA: Diagnosis not present

## 2023-08-16 DIAGNOSIS — Z5112 Encounter for antineoplastic immunotherapy: Secondary | ICD-10-CM | POA: Diagnosis not present

## 2023-08-16 DIAGNOSIS — Z95828 Presence of other vascular implants and grafts: Secondary | ICD-10-CM

## 2023-08-16 DIAGNOSIS — E876 Hypokalemia: Secondary | ICD-10-CM | POA: Diagnosis not present

## 2023-08-16 DIAGNOSIS — R112 Nausea with vomiting, unspecified: Secondary | ICD-10-CM | POA: Diagnosis not present

## 2023-08-16 LAB — CMP (CANCER CENTER ONLY)
ALT: 18 U/L (ref 0–44)
AST: 21 U/L (ref 15–41)
Albumin: 3.6 g/dL (ref 3.5–5.0)
Alkaline Phosphatase: 87 U/L (ref 38–126)
Anion gap: 7 (ref 5–15)
BUN: 9 mg/dL (ref 6–20)
CO2: 29 mmol/L (ref 22–32)
Calcium: 8.6 mg/dL — ABNORMAL LOW (ref 8.9–10.3)
Chloride: 101 mmol/L (ref 98–111)
Creatinine: 0.77 mg/dL (ref 0.44–1.00)
GFR, Estimated: >60 mL/min (ref >60)
Glucose, Bld: 140 mg/dL — ABNORMAL HIGH (ref 70–99)
Potassium: 3.1 mmol/L — ABNORMAL LOW (ref 3.5–5.1)
Sodium: 137 mmol/L (ref 135–145)
Total Bilirubin: 0.5 mg/dL (ref 0.0–1.2)
Total Protein: 5.9 g/dL — ABNORMAL LOW (ref 6.5–8.1)

## 2023-08-16 LAB — CBC WITH DIFFERENTIAL (CANCER CENTER ONLY)
Abs Immature Granulocytes: 0.01 10*3/uL (ref 0.00–0.07)
Basophils Absolute: 0 10*3/uL (ref 0.0–0.1)
Basophils Relative: 1 %
Eosinophils Absolute: 0.4 10*3/uL (ref 0.0–0.5)
Eosinophils Relative: 10 %
HCT: 37.5 % (ref 36.0–46.0)
Hemoglobin: 13.8 g/dL (ref 12.0–15.0)
Immature Granulocytes: 0 %
Lymphocytes Relative: 22 %
Lymphs Abs: 1 10*3/uL (ref 0.7–4.0)
MCH: 32.8 pg (ref 26.0–34.0)
MCHC: 36.8 g/dL — ABNORMAL HIGH (ref 30.0–36.0)
MCV: 89.1 fL (ref 80.0–100.0)
Monocytes Absolute: 0.6 10*3/uL (ref 0.1–1.0)
Monocytes Relative: 13 %
Neutro Abs: 2.3 10*3/uL (ref 1.7–7.7)
Neutrophils Relative %: 54 %
Platelet Count: 294 10*3/uL (ref 150–400)
RBC: 4.21 MIL/uL (ref 3.87–5.11)
RDW: 13.9 % (ref 11.5–15.5)
WBC Count: 4.2 10*3/uL (ref 4.0–10.5)
nRBC: 0 % (ref 0.0–0.2)

## 2023-08-16 LAB — MAGNESIUM: Magnesium: 1.8 mg/dL (ref 1.7–2.4)

## 2023-08-16 MED ORDER — POTASSIUM CHLORIDE 10 MEQ/100ML IV SOLN
10.0000 meq | INTRAVENOUS | Status: AC
Start: 2023-08-16 — End: 2023-08-16
  Administered 2023-08-16 (×2): 10 meq via INTRAVENOUS
  Filled 2023-08-16 (×2): qty 100

## 2023-08-16 MED ORDER — POTASSIUM CHLORIDE CRYS ER 20 MEQ PO TBCR
60.0000 meq | EXTENDED_RELEASE_TABLET | Freq: Once | ORAL | Status: AC
  Administered 2023-08-16: 60 meq via ORAL
  Filled 2023-08-16: qty 3

## 2023-08-16 MED ORDER — SODIUM CHLORIDE 0.9 % IV SOLN
12.5000 mg | Freq: Once | INTRAVENOUS | Status: AC
  Administered 2023-08-16: 12.5 mg via INTRAVENOUS
  Filled 2023-08-16: qty 0.5

## 2023-08-16 MED ORDER — ONDANSETRON HCL 4 MG/2ML IJ SOLN
8.0000 mg | Freq: Once | INTRAMUSCULAR | Status: AC
  Administered 2023-08-16: 8 mg via INTRAVENOUS
  Filled 2023-08-16: qty 4

## 2023-08-16 MED ORDER — HEPARIN SOD (PORK) LOCK FLUSH 100 UNIT/ML IV SOLN
500.0000 [IU] | Freq: Once | INTRAVENOUS | Status: AC | PRN
  Administered 2023-08-16: 500 [IU]

## 2023-08-16 MED ORDER — SODIUM CHLORIDE 0.9% FLUSH
10.0000 mL | Freq: Once | INTRAVENOUS | Status: AC | PRN
  Administered 2023-08-16: 10 mL

## 2023-08-16 MED ORDER — SODIUM CHLORIDE 0.9 % IV SOLN
Freq: Once | INTRAVENOUS | Status: AC
Start: 2023-08-16 — End: 2023-08-16

## 2023-08-16 NOTE — Progress Notes (Signed)
 Patient's potassium found to be 3.1 today. Per Rosalind Columbia, PA-C - patient is to have 60 mEq KCl PO and 2 runs of IV Kcl.  Orders placed. Patient told to hold evening dose of potassium. Patient verbalized an understanding of the information and confirmed that she would hold evening dose.

## 2023-08-16 NOTE — Patient Instructions (Signed)
 Nausea and Vomiting, Adult Nausea is the feeling that you have an upset stomach or that you are about to vomit. As nausea gets worse, it can lead to vomiting. Vomiting is when stomach contents forcefully come out of your mouth as a result of nausea. Vomiting can make you feel weak and cause you to become dehydrated. Dehydration can make you feel tired and thirsty, cause you to have a dry mouth, and decrease how often you urinate. Older adults and people with other diseases or a weak disease-fighting system (immune system) are at higher risk for dehydration. It is important to treat your nausea and vomiting as told by your health care provider. Follow these instructions at home: Watch your symptoms for any changes. Tell your health care provider about them. Eating and drinking     Take an oral rehydration solution (ORS). This is a drink that is sold at pharmacies and retail stores. Drink clear fluids slowly and in small amounts as you are able. Clear fluids include water, ice chips, low-calorie sports drinks, and fruit juice that has water added (diluted fruit juice). Eat bland, easy-to-digest foods in small amounts as you are able. These foods include bananas, applesauce, rice, lean meats, toast, and crackers. Avoid fluids that contain a lot of sugar or caffeine, such as energy drinks, sports drinks, and soda. Avoid alcohol. Avoid spicy or fatty foods. General instructions Take over-the-counter and prescription medicines only as told by your health care provider. Drink enough fluid to keep your urine pale yellow. Wash your hands often using soap and water for at least 20 seconds. If soap and water are not available, use hand sanitizer. Make sure that everyone in your household washes their hands well and often. Rest at home while you recover. Watch your condition for any changes. Take slow and deep breaths when you feel nauseous. Keep all follow-up visits. This is important. Contact a health  care provider if: Your symptoms get worse. You have new symptoms. You have a fever. You cannot drink fluids without vomiting. Your nausea does not go away after 2 days. You feel light-headed or dizzy. You have a headache. You have muscle cramps. You have a rash. You have pain while urinating. Get help right away if: You have pain in your chest, neck, arm, or jaw. You feel extremely weak or you faint. You have persistent vomiting. You have vomit that is bright red or looks like black coffee grounds. You have bloody or black stools (feces) or stools that look like tar. You have a severe headache, a stiff neck, or both. You have severe pain, cramping, or bloating in your abdomen. You have difficulty breathing, or you are breathing very quickly. Your heart is beating very quickly. Your skin feels cold and clammy. You feel confused. You have signs of dehydration, such as: Dark urine, very little urine, or no urine. Cracked lips. Dry mouth. Sunken eyes. Sleepiness. Weakness. These symptoms may be an emergency. Get help right away. Call 911. Do not wait to see if the symptoms will go away. Do not drive yourself to the hospital. Summary Nausea is the feeling that you have an upset stomach or that you are about to vomit. As nausea gets worse, it can lead to vomiting. Vomiting can make you feel weak and cause you to become dehydrated. Follow instructions from your health care provider about eating and drinking to prevent dehydration. Take over-the-counter and prescription medicines only as told by your health care provider. Contact your health care  provider if your symptoms get worse, or you have new symptoms. Keep all follow-up visits. This is important. This information is not intended to replace advice given to you by your health care provider. Make sure you discuss any questions you have with your health care provider. Document Revised: 10/21/2020 Document Reviewed:  10/21/2020 Elsevier Patient Education  2024 [employer].

## 2023-08-16 NOTE — Progress Notes (Signed)
 Symptom Management Consult Note [facility]    Patient Care Team: Arcadio Knuckles, MD as PCP - General (Internal Medicine) Sonja Schulenburg, MD as Consulting Physician (Oncology)    Name / MRN / DOB: Sheryl [name]  [id number]  19-Jan-1966   Date of visit: 08/16/2023   Chief Complaint/Reason for visit: nausea and vomiting   Current Therapy: FOLFOX and zolbetuximab   Last treatment:  Day 15   Cycle 1 on 08/07/23    ASSESSMENT AND PLAN Patient is a 57 y.o. female with oncologic history of gastric cancer followed by Dr. Maryalice Smaller.  I have viewed most recent oncology note and lab work.  #Gastric Cancer - Next appointment with oncologist is 08/21/23   #Nausea and vomiting - Intermittent nausea and vomiting managed with Zofran  and Compazine  when taking consistently. No fever.  - Continue Zofran  every eight hours as needed. - Continue Compazine  every six hours as needed. - Ensure hydration by taking nausea medication before fluids. - CMP showing hypokalemia 3.1, magnesium  is WNL. Suspect this is due to GI loss. Patient received 20 mEq IV potassium in clinic and 60 mEq PO.  She has potassium prescription at home and will resume tomorrow.  - Patient tolerating PO intake on reassessment and  prefers to continue outpatient symptom management.  #Constipation - Colace used nightly, may need dose increase. Miralax recommended if no bowel movement for over two days. - Abdominal exam is benign. Patient admits to passing flatus,  Strict ED precautions discussed should symptoms worsen.   HEME/ONC HISTORY Oncology History Overview Note   Cancer Staging  Gastric cancer [facility]) Staging form: Stomach, AJCC 8th Edition - Clinical stage from 04/19/2022: Stage IVB (cT2, cN0, pM1) - Signed by Sonja , MD on 05/08/2022 Total positive nodes: 0     Gastric cancer (HCC)  03/30/2022 Procedure   EGD:  Impression:  - Normal esophagus. - A few gastric polyps. Biopsied. - Gastritis.  Biopsied. - Non-bleeding gastric ulcer with no stigmata of bleeding. Biopsied. - Normal examined duodenum. Biopsied.  Findings: Diffuse moderate inflammation characterized by congestion (edema), friability and granularity was found in the cardia, in the gastric fundus and in the gastric body. There were associated erosions in multiple places. Biopsies were taken from the antrum, body, and fundus with a cold forceps for histology. Estimated blood loss was minimal.  One non-bleeding cratered gastric ulcer with no stigmata of bleeding was found on the greater curvature of the stomach. The lesion was 6 mm in largest dimension. The mucosa around the ulcer was heaped and led to some deformity in the antrum. Biopsies were taken with a cold forceps for histology. Estimated blood loss was minimal.    03/30/2022 Pathology Results   Patient: Mccaughan, Bernardette P  Accession: [id number]  Diagnosis 1. Surgical [P], duodenal - BENIGN SMALL BOWEL MUCOSA WITH NO SIGNIFICANT PATHOLOGIC CHANGES 2. Surgical [P], gastric antrum - GASTRIC ANTRAL MUCOSA WITH FEATURES OF REACTIVE GASTROPATHY - NEGATIVE FOR H. PYLORI ON H&E STAIN - NEGATIVE FOR INTESTINAL METAPLASIA OR MALIGNANCY 3. Surgical [P], gastric body - GASTRIC OXYNTIC MUCOSA WITH REACTIVE/REPARATIVE CHANGES - NEGATIVE FOR H. PYLORI ON H&E STAIN - NEGATIVE FOR INTESTINAL METAPLASIA, DYSPLASIA OR MALIGNANCY 4. Surgical [P], greater curve ulceration - ADENOCARCINOMA WITH SIGNET RING CELL FEATURES (SEE NOTE) 5. Surgical [P], gastric polyps - ADENOCARCINOMA WITH SIGNET RING CELL FEATURES (SEE NOTE) 6. Surgical [P], fundus (gastric) - ADENOCARCINOMA WITH SIGNET RING CELL FEATURES (SEE NOTE) 7. Surgical [P], colon, ascending, polyp (1) -  TUBULAR ADENOMA. - NO HIGH GRADE DYSPLASIA OR MALIGNANCY. 8. Surgical [P], colon, transverse, polyp (1) - TUBULAR ADENOMA. - NO HIGH GRADE DYSPLASIA OR MALIGNANCY.    04/13/2022 Initial Diagnosis   Gastric cancer  (HCC)   04/19/2022 Cancer Staging   Staging form: Stomach, AJCC 8th Edition - Clinical stage from 04/19/2022: Stage IVB (cT2, cN0, pM1) - Signed by Sonja Winchester, MD on 05/08/2022 Total positive nodes: 0   05/05/2022 Genetic Testing   Negative genetic testing on the Multi-cancer gene panel + RNA.  FH c.259C>T VUS identified.  The report date is May 05, 2022.  The Multi-Cancer + RNA Panel offered by [facility] includes sequencing and/or deletion/duplication analysis of the following 70 genes:  AIP*, ALK, APC*, ATM*, AXIN2*, BAP1*, BARD1*, BLM*, BMPR1A*, BRCA1*, BRCA2*, BRIP1*, CDC73*, CDH1*, CDK4, CDKN1B*, CDKN2A, CHEK2*, CTNNA1*, DICER1*, EPCAM (del/dup only), EGFR, FH*, FLCN*, GREM1 (promoter dup only), HOXB13, KIT, LZTR1, MAX*, MBD4, MEN1*, MET, MITF, MLH1*, MSH2*, MSH3*, MSH6*, MUTYH*, NF1*, NF2*, NTHL1*, PALB2*, PDGFRA, PMS2*, POLD1*, POLE*, POT1*, PRKAR1A*, PTCH1*, PTEN*, RAD51C*, RAD51D*, RB1*, RET, SDHA* (sequencing only), SDHAF2*, SDHB*, SDHC*, SDHD*, SMAD4*, SMARCA4*, SMARCB1*, SMARCE1*, STK11*, SUFU*, TMEM127*, TP53*, TSC1*, TSC2*, VHL*. RNA analysis is performed for * genes.    05/09/2022 - 06/07/2022 Chemotherapy   Patient is on Treatment Plan : GASTROESOPHAGEAL FLOT q14d X 4 cycles      Miscellaneous   Foundation One  Biomarker Findings Microsatellite status- Cannot be determined Tumor Mutational Burden- Cannot be determined  Genomic Findings  FGFR2 amplification,FGFR2-TACC2 fusion,  Rearrangement intron 17 ARAF amplification CCND3 amplification TP53 V223fs*74     05/30/2022 Imaging    IMPRESSION: 1. Mild hypermetabolism corresponding to a dominant left upper quadrant mass and smaller perigastric nodules or nodes. 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 Recommend attention to the dominant left upper quadrant soft tissue mass on follow-up exams to exclude unlikely recurrent lymphoma. 2. No gastric hypermetabolism and no typical findings of  metastatic disease.   06/20/2022 - 11/16/2022 Chemotherapy   Patient is on Treatment Plan : GASTRIC FOLFOX q14d x 12 cycles     08/27/2022 Imaging    IMPRESSION: No focal gastric mass on CT.   No findings suspicious for recurrent or metastatic disease.   Stable left upper abdominal soft tissue lesion and small lymph nodes, chronic, favoring treated lymphoma.   11/27/2022 Imaging    IMPRESSION: 1. Questionable thickening of the distal esophagus/GE junction and gastric antrum, consider further evaluation with endoscopy. 2. Chronically stable left upper quadrant nodularity and prominent lymph nodes again favored treated lymphoma. Continued attention on follow-up imaging suggested. 3. No convincing evidence of metastatic disease in the chest, abdomen or pelvis. 4. Questionable asymmetric wall thickening of the rectum, consider further evaluation with colonoscopy. 5. Mild wall thickening of a nondistended urinary bladder, correlate with urinalysis to exclude cystitis. 6. Hepatic steatosis.   07/10/2023 -  Chemotherapy   Patient is on Treatment Plan : GASTROESOPHAGEAL Zolbetuximab (800/400) + FOLFOX D1,15,29 q42d x 4 cycles / Zolbetuximab (400) + 5FU + Leucovorin  D1,15,29 q42d         INTERVAL HISTORY  Discussed the use of AI scribe software for clinical note transcription with the patient, who gave verbal consent to proceed.    ADDALEIGH [name] is a 58 y.o. female with oncologic history as above presenting to [facility] today with chief complaint of nausea and vomiting.   She has been experiencing intermittent nausea and vomiting x 4 days, with symptoms occurring on  Monday, Tuesday, and Wednesday, a break on Thursday, and recurrence today. The nausea is intermittent, occurring throughout the day mostly. Oncologist prescribed Phenergan  recently. She believes alternating the zofran , Compazine , and Phenergan  was helpful yesterday. She has only taken Compazine  so far this morning.  She has  been taking Colace nightly to manage constipation, as she has not had a bowel movement in a couple of days. She denies any abdominal pain and admits to passing flatus.    ROS  All other systems are reviewed and are negative for acute change except as noted in the HPI.    Allergies  Allergen Reactions   Aspirin Anaphylaxis   Cyclobenzaprine Anaphylaxis   Naproxen Sodium Anaphylaxis   Zithromax [Azithromycin Dihydrate] Anaphylaxis   Oxaliplatin  Other (See Comments)    Nausea, Scratchy/tight throat, flushing, diaphoretic.See progress note from 07/04/22   Dilaudid [Hydromorphone] Nausea And Vomiting     Past Medical History:  Diagnosis Date   Blood transfusion without reported diagnosis    had transfusion with hysterectomy   Cataract    Colon polyps 2012   Diabetes (HCC) 03/13/2021   Diabetes (HCC) 05/21/2019   Family history of breast cancer    Family history of pancreatic cancer    Family history of stomach cancer    Fibroid    gastric ca 03/2022   GERD (gastroesophageal reflux disease)    H/O blood clots    History of hysterectomy    fibroids and heavy cycles   Hypertension      Past Surgical History:  Procedure Laterality Date   ABDOMINAL HYSTERECTOMY     BIOPSY  04/19/2022   Procedure: BIOPSY;  Surgeon: Normie Becton., MD;  Location: [facility];  Service: Gastroenterology;;   BIOPSY  03/21/2023   Procedure: BIOPSY;  Surgeon: Normie Becton., MD;  Location: [facility];  Service: Gastroenterology;;   COLONOSCOPY     ESOPHAGOGASTRODUODENOSCOPY (EGD) WITH PROPOFOL  N/A 04/19/2022   Procedure: ESOPHAGOGASTRODUODENOSCOPY (EGD) WITH PROPOFOL ;  Surgeon: Normie Becton., MD;  Location: [facility];  Service: Gastroenterology;  Laterality: N/A;   ESOPHAGOGASTRODUODENOSCOPY (EGD) WITH PROPOFOL  N/A 03/21/2023   Procedure: ESOPHAGOGASTRODUODENOSCOPY (EGD) WITH PROPOFOL ;  Surgeon: Brice Campi Albino Alu., MD;  Location: [facility];  Service:  Gastroenterology;  Laterality: N/A;   EUS N/A 04/19/2022   Procedure: UPPER ENDOSCOPIC ULTRASOUND (EUS) RADIAL;  Surgeon: Normie Becton., MD;  Location: [facility];  Service: Gastroenterology;  Laterality: N/A;   EXCISION OF SKIN TAG  05/03/2022   Procedure: EXCISION OF CHEST WALL SKIN LESION;  Surgeon: Lujean Sake, MD;  Location: [facility];  Service: General;;   LAPAROSCOPY N/A 05/03/2022   Procedure: LAPAROSCOPY DIAGNOSTIC WITH PERITONEAL WASHINGS;  Surgeon: Lujean Sake, MD;  Location: [facility];  Service: General;  Laterality: N/A;   POLYPECTOMY  04/19/2022   Procedure: POLYPECTOMY;  Surgeon: Normie Becton., MD;  Location: [facility];  Service: Gastroenterology;;   PORTACATH PLACEMENT N/A 05/03/2022   Procedure: INSERTION PORT-A-CATH WITH ULTRASOUND GUIDANCE;  Surgeon: Lujean Sake, MD;  Location: [facility];  Service: General;  Laterality: N/A;   UPPER GASTROINTESTINAL ENDOSCOPY      Social History   Socioeconomic History   Marital status: Widowed    Spouse name: Not on file   Number of children: 2   Years of education: Not on file   Highest education level: Associate degree: occupational, Scientist, product/process development, or vocational program  Occupational History   Occupation: [employer] farm manager  Tobacco Use   Smoking status: Never   Smokeless tobacco:  Never  Vaping Use   Vaping status: Never Used  Substance and Sexual Activity   Alcohol use: No   Drug use: No   Sexual activity: Yes    Birth control/protection: Surgical    Comment: Hyst  Other Topics Concern   Not on file  Social History Narrative   Not on file   Social Drivers of Health   Financial Resource Strain: Low Risk  (03/06/2023)   Overall Financial Resource Strain (CARDIA)    Difficulty of Paying Living Expenses: Not hard at all  Food Insecurity: No Food Insecurity (03/06/2023)   Hunger Vital Sign    Worried About Running Out of Food in the Last Year: Never true    Ran Out of Food in the Last Year: Never true   Transportation Needs: No Transportation Needs (03/06/2023)   PRAPARE - Administrator, Civil Service (Medical): No    Lack of Transportation (Non-Medical): No  Physical Activity: Insufficiently Active (03/06/2023)   Exercise Vital Sign    Days of Exercise per Week: 1 day    Minutes of Exercise per Session: 10 min  Stress: No Stress Concern Present (03/06/2023)   [employer] of Occupational Health - Occupational Stress Questionnaire    Feeling of Stress : Not at all  Social Connections: Moderately Isolated (03/06/2023)   Social Connection and Isolation Panel [NHANES]    Frequency of Communication with Friends and Family: More than three times a week    Frequency of Social Gatherings with Friends and Family: Twice a week    Attends Religious Services: More than 4 times per year    Active Member of [employer] or Organizations: No    Attends Banker Meetings: Not on file    Marital Status: Widowed  Catering manager Violence: Not on file    Family History  Problem Relation Age of Onset   Stroke Mother    Diabetes Mother    Hypertension Mother    Multiple myeloma Mother    Stroke Father    Pancreatic cancer Maternal Aunt    Stomach cancer Maternal Uncle    Breast cancer Paternal Aunt    Stomach cancer Paternal Aunt    Stomach cancer Paternal Uncle    Heart attack Maternal Grandmother    Breast cancer Paternal Grandmother    Diabetes Other    Hypertension Other    Stroke Other    Cancer Other    Heart attack Other    Esophageal cancer Neg Hx    Liver disease Neg Hx    Colon cancer Neg Hx    Rectal cancer Neg Hx      Current Outpatient Medications:    acetaminophen  (TYLENOL ) 500 MG tablet, Take 2 tablets (1,000 mg total) by mouth every 8 (eight) hours as needed (pain)., Disp: 30 tablet, Rfl: 1   amLODipine  (NORVASC ) 10 MG tablet, Take 1 tablet (10 mg total) by mouth daily., Disp: 30 tablet, Rfl: 2   b complex vitamins capsule, Take 1 capsule by mouth  daily., Disp: , Rfl:    Cyanocobalamin (VITAMIN B 12 PO), Take by mouth., Disp: , Rfl:    dexamethasone  (DECADRON ) 4 MG tablet, Take 2 tabs daily for 2-3 days then 1 tab daily for additional 2-4 days, total no more than 7 days, Disp: 10 tablet, Rfl: 0   famotidine  (PEPCID ) 20 MG tablet, Take 1 tablet (20 mg total) by mouth 2 (two) times daily., Disp: 60 tablet, Rfl: 2   HYDROcodone -acetaminophen  (NORCO/VICODIN) 5-325  MG tablet, Take 1 tablet by mouth every 6 (six) hours as needed for moderate pain (pain score 4-6)., Disp: 20 tablet, Rfl: 0   lidocaine -prilocaine  (EMLA ) cream, Apply 1 Application topically as needed., Disp: 30 g, Rfl: 1   ondansetron  (ZOFRAN ) 8 MG tablet, Take 1 tablet (8 mg total) by mouth every 8 (eight) hours as needed for nausea or vomiting., Disp: 30 tablet, Rfl: 2   POTASSIUM PO, Take 99 mg by mouth daily., Disp: , Rfl:    prochlorperazine  (COMPAZINE ) 10 MG tablet, Take 1 tablet (10 mg total) by mouth every 6 (six) hours as needed for nausea or vomiting., Disp: 30 tablet, Rfl: 2   promethazine  (PHENERGAN ) 25 MG tablet, Take 1 tablet (25 mg total) by mouth every 6 (six) hours as needed for nausea or vomiting., Disp: 30 tablet, Rfl: 0   scopolamine  (TRANSDERM-SCOP) 1 MG/3DAYS, Place 1 patch (1.5 mg total) onto the skin every 3 (three) days., Disp: 10 patch, Rfl: 1   sucralfate  (CARAFATE ) 1 g tablet, Take 1 tablet (1 g total) by mouth 2 (two) times daily., Disp: 60 tablet, Rfl: 6  PHYSICAL EXAM ECOG FS:1 - Symptomatic but completely ambulatory    Vitals:   08/16/23 0901  BP: (!) 123/98  Pulse: 82  Resp: 16  Temp: 97.6 F (36.4 C)  TempSrc: Temporal  SpO2: 100%  Weight: 198 lb 1.6 oz (89.9 kg)   Physical Exam Vitals and nursing note reviewed.  Constitutional:      Appearance: She is not ill-appearing or toxic-appearing.  HENT:     Head: Normocephalic.     Mouth/Throat:     Mouth: Mucous membranes are dry.  Eyes:     Conjunctiva/sclera: Conjunctivae normal.   Cardiovascular:     Rate and Rhythm: Normal rate and regular rhythm.     Pulses: Normal pulses.     Heart sounds: Normal heart sounds.  Pulmonary:     Effort: Pulmonary effort is normal.     Breath sounds: Normal breath sounds.  Abdominal:     General: Bowel sounds are normal. There is no distension.     Palpations: Abdomen is soft.     Tenderness: There is no abdominal tenderness. There is no guarding or rebound.  Musculoskeletal:     Cervical back: Normal range of motion.  Skin:    General: Skin is warm and dry.  Neurological:     Mental Status: She is alert.        LABORATORY DATA I have reviewed the data as listed    Latest Ref Rng & Units 08/16/2023    9:21 AM 08/02/2023    9:16 AM 07/24/2023    8:22 AM  CBC  WBC 4.0 - 10.5 K/uL 4.2  3.7  4.9   Hemoglobin 12.0 - 15.0 g/dL 40.9  81.1  91.4   Hematocrit 36.0 - 46.0 % 37.5  38.2  38.7   Platelets 150 - 400 K/uL 294  210  246         Latest Ref Rng & Units 08/16/2023    9:21 AM 08/02/2023    9:16 AM 07/24/2023    8:22 AM  CMP  Glucose 70 - 99 mg/dL 782  956  213   BUN 6 - 20 mg/dL 9  9  15    Creatinine 0.44 - 1.00 mg/dL 0.86  5.78  4.69   Sodium 135 - 145 mmol/L 137  138  139   Potassium 3.5 - 5.1 mmol/L 3.1  3.6  3.0   Chloride 98 - 111 mmol/L 101  102  103   CO2 22 - 32 mmol/L 29  29  30    Calcium  8.9 - 10.3 mg/dL 8.6  8.7  8.3   Total Protein 6.5 - 8.1 g/dL 5.9  5.9  5.7   Total Bilirubin 0.0 - 1.2 mg/dL 0.5  0.6  0.5   Alkaline Phos 38 - 126 U/L 87  87  86   AST 15 - 41 U/L 21  33  18   ALT 0 - 44 U/L 18  30  22         RADIOGRAPHIC STUDIES (from last 24 hours if applicable) I have personally reviewed the radiological images as listed and agreed with the findings in the report. No results found.      Visit Diagnosis: 1. Hypokalemia   2. Nausea and vomiting, unspecified vomiting type   3. Malignant neoplasm of body of stomach (HCC)      No orders of the defined types were placed in this  encounter.   All questions were answered. The patient knows to call the clinic with any problems, questions or concerns. No barriers to learning was detected.  A total of more than 30 minutes were spent on this encounter with face-to-face time and non-face-to-face time, including preparing to see the patient, ordering tests and/or medications, counseling the patient and coordination of care as outlined above.    Thank you for allowing me to participate in the care of this patient.    Macen Joslin E  Walisiewicz, PA-C Department of Hematology/Oncology [facility] Phone: [phone]  [phone]    08/16/2023 2:28 PM

## 2023-08-20 ENCOUNTER — Other Ambulatory Visit: Payer: Self-pay

## 2023-08-20 ENCOUNTER — Ambulatory Visit: Admitting: Hematology

## 2023-08-20 ENCOUNTER — Ambulatory Visit

## 2023-08-20 ENCOUNTER — Other Ambulatory Visit

## 2023-08-20 ENCOUNTER — Telehealth: Payer: Self-pay

## 2023-08-20 MED ORDER — POTASSIUM CHLORIDE CRYS ER 20 MEQ PO TBCR
EXTENDED_RELEASE_TABLET | ORAL | 0 refills | Status: DC
  Filled 2023-08-20: qty 80, 30d supply, fill #0

## 2023-08-20 MED FILL — Fosaprepitant Dimeglumine For IV Infusion 150 MG (Base Eq): INTRAVENOUS | Qty: 5 | Status: AC

## 2023-08-20 NOTE — Assessment & Plan Note (Signed)
 [id number] with peritoneal metastasis. 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   -she started first line chemo FLOT on 1/10 -She understands that chemotherapy is palliative, to prolong her life.  We are unlikely going to cure her cancer. -PD-L1 0-1%, no significant benefit from PD-L1 immunotherapy, FO revealed FGFR2 amplification and fusion (+), FGFR inhibitors can be considered in future, no other targeted therapy available  -She has been tolerating chemo very well, will continue for now  -PET scan from 05/30/2022 was negative for primary tumor or metastatic disease, the known peritoneal mets did not show on PET.  -she has been tolerating chemo well overall. Due to fatigue, I have changed her chemo from FLOT to FOLFOX on 06/20/2022, she tolerated well -she previously asked the role of surgery, depends on her next restaging CT scan findings, I may refer her to [facility] or [facility] to discuss HIPEC surgery  -due to her infusion reaction to oxaliplatin  on C5, we added additional premeds and gave slow infusion over 4 hours for cycle 6 and she tolerated well  -She is not able to return to work due to the cancer and treatment related symptoms.  -She is tolerating FOLFOX well overall, with moderate fatigue for a few days after infusion but able to recover well.  No signs of neuropathy at this point. -Restaging CT abdomen pelvis from August 27, 2022 showed no residual disease.  -We again discussed maintenance therapy with Xeloda  down the road, we will stop oxaliplatin  when she develops side effects especially neuropathy, or after next scan -repeated staging CT from 11/26/2021 showed stable disease  -I have changed her treatment to maintenance Xeloda  in early August 2024, she is tolerating well overall  -her NGS Caris showed positive Claudin 18.2, she is a  candidate for zolbetuximab.  -Repeated EGD on March 21, 2023 showed residual gastric cancer.  We discussed option of changing her chemotherapy back to FOLFOX and add zolbetuximab.  -PET 06/03/2023 showed stable disease -Patient developed recurrent abdominal pain, similar to the symptoms she had when she was diagnosed.  She agreed to change treatment back to FOLFOX, and add Zolbetuximab. She started on 07/09/2023. She tolerated first cycle poorly and had prolonged recovery. Oxaliplatin  was stop after cycle 1.

## 2023-08-20 NOTE — Telephone Encounter (Signed)
 Spoke with pt via telephone to inform pt that Dr. Maryalice Smaller has reviewed pt's recent labs and pt's K+ was 3.1; therefore, Dr. Maryalice Smaller would like for the pt to start taking Potassium Choride 40meq for 5 days and then 20meq for 25 days.  Stated prescription was sent to pt's preferred pharmacy.  Stated pt's electrolyte (k+) was low d/t pt's recent episodes of n/v and diarrhea.  Pt stated the n/v has resolved now since Dr. Maryalice Smaller prescribed Phenergan .  Reviewed how to take antiemetics so they are effective in resolving pt's n/v.  Pt verbalized understanding.  Pt c/o constipation but stated she had a BM today.  Stated side effect of antiemetics can be constipation.  Instructed pt to take stool softener when taking antiemetics.  Pt verbalized understanding and had no further questions or concerns at this time.

## 2023-08-21 ENCOUNTER — Inpatient Hospital Stay: Admitting: Dietician

## 2023-08-21 ENCOUNTER — Ambulatory Visit: Admitting: Hematology

## 2023-08-21 ENCOUNTER — Inpatient Hospital Stay

## 2023-08-21 ENCOUNTER — Other Ambulatory Visit

## 2023-08-21 ENCOUNTER — Ambulatory Visit

## 2023-08-21 ENCOUNTER — Inpatient Hospital Stay (HOSPITAL_BASED_OUTPATIENT_CLINIC_OR_DEPARTMENT_OTHER): Admitting: Hematology

## 2023-08-21 ENCOUNTER — Encounter: Payer: Self-pay | Admitting: Hematology

## 2023-08-21 VITALS — BP 124/86 | HR 73 | Temp 98.9°F | Resp 12 | Wt 190.5 lb

## 2023-08-21 DIAGNOSIS — Z5112 Encounter for antineoplastic immunotherapy: Secondary | ICD-10-CM | POA: Diagnosis not present

## 2023-08-21 DIAGNOSIS — C162 Malignant neoplasm of body of stomach: Secondary | ICD-10-CM

## 2023-08-21 DIAGNOSIS — Z95828 Presence of other vascular implants and grafts: Secondary | ICD-10-CM

## 2023-08-21 LAB — CBC WITH DIFFERENTIAL (CANCER CENTER ONLY)
Abs Immature Granulocytes: 0.01 10*3/uL (ref 0.00–0.07)
Basophils Absolute: 0.1 10*3/uL (ref 0.0–0.1)
Basophils Relative: 2 %
Eosinophils Absolute: 0.4 10*3/uL (ref 0.0–0.5)
Eosinophils Relative: 14 %
HCT: 36.5 % (ref 36.0–46.0)
Hemoglobin: 12.8 g/dL (ref 12.0–15.0)
Immature Granulocytes: 0 %
Lymphocytes Relative: 23 %
Lymphs Abs: 0.7 10*3/uL (ref 0.7–4.0)
MCH: 31.8 pg (ref 26.0–34.0)
MCHC: 35.1 g/dL (ref 30.0–36.0)
MCV: 90.6 fL (ref 80.0–100.0)
Monocytes Absolute: 0.4 10*3/uL (ref 0.1–1.0)
Monocytes Relative: 13 %
Neutro Abs: 1.5 10*3/uL — ABNORMAL LOW (ref 1.7–7.7)
Neutrophils Relative %: 48 %
Platelet Count: 252 10*3/uL (ref 150–400)
RBC: 4.03 MIL/uL (ref 3.87–5.11)
RDW: 13.6 % (ref 11.5–15.5)
WBC Count: 3 10*3/uL — ABNORMAL LOW (ref 4.0–10.5)
nRBC: 0 % (ref 0.0–0.2)

## 2023-08-21 LAB — CMP (CANCER CENTER ONLY)
ALT: 13 U/L (ref 0–44)
AST: 20 U/L (ref 15–41)
Albumin: 3.5 g/dL (ref 3.5–5.0)
Alkaline Phosphatase: 91 U/L (ref 38–126)
Anion gap: 7 (ref 5–15)
BUN: 6 mg/dL (ref 6–20)
CO2: 26 mmol/L (ref 22–32)
Calcium: 8.5 mg/dL — ABNORMAL LOW (ref 8.9–10.3)
Chloride: 104 mmol/L (ref 98–111)
Creatinine: 0.77 mg/dL (ref 0.44–1.00)
GFR, Estimated: >60 mL/min (ref >60)
Glucose, Bld: 175 mg/dL — ABNORMAL HIGH (ref 70–99)
Potassium: 3.6 mmol/L (ref 3.5–5.1)
Sodium: 137 mmol/L (ref 135–145)
Total Bilirubin: 0.8 mg/dL (ref 0.0–1.2)
Total Protein: 6 g/dL — ABNORMAL LOW (ref 6.5–8.1)

## 2023-08-21 MED ORDER — HEPARIN SOD (PORK) LOCK FLUSH 100 UNIT/ML IV SOLN: 500.0000 [IU] | Freq: Once | INTRAVENOUS | Status: DC | PRN

## 2023-08-21 MED ORDER — SODIUM CHLORIDE 0.9 % IV SOLN: INTRAVENOUS | Status: DC

## 2023-08-21 MED ORDER — SODIUM CHLORIDE 0.9% FLUSH
10.0000 mL | INTRAVENOUS | Status: DC | PRN
Start: 2023-08-21 — End: 2023-08-21

## 2023-08-21 MED ORDER — OLANZAPINE 5 MG PO TABS
5.0000 mg | ORAL_TABLET | Freq: Once | ORAL | Status: AC
  Administered 2023-08-21: 5 mg via ORAL
  Filled 2023-08-21: qty 1

## 2023-08-21 MED ORDER — SODIUM CHLORIDE 0.9% FLUSH
10.0000 mL | Freq: Once | INTRAVENOUS | Status: AC
Start: 2023-08-21 — End: 2023-08-21
  Administered 2023-08-21: 10 mL

## 2023-08-21 MED ORDER — DEXAMETHASONE SODIUM PHOSPHATE 10 MG/ML IJ SOLN
10.0000 mg | Freq: Once | INTRAMUSCULAR | Status: AC
  Administered 2023-08-21: 10 mg via INTRAVENOUS
  Filled 2023-08-21: qty 1

## 2023-08-21 MED ORDER — DIPHENHYDRAMINE HCL 25 MG PO CAPS
25.0000 mg | ORAL_CAPSULE | Freq: Once | ORAL | Status: AC
  Administered 2023-08-21: 25 mg via ORAL
  Filled 2023-08-21: qty 1

## 2023-08-21 MED ORDER — PALONOSETRON HCL INJECTION 0.25 MG/5ML
0.2500 mg | Freq: Once | INTRAVENOUS | Status: AC
  Administered 2023-08-21: 0.25 mg via INTRAVENOUS
  Filled 2023-08-21: qty 5

## 2023-08-21 MED ORDER — SODIUM CHLORIDE 0.9 % IV SOLN
400.0000 mg/m2 | Freq: Once | INTRAVENOUS | Status: AC
  Administered 2023-08-21: 844 mg via INTRAVENOUS
  Filled 2023-08-21: qty 25

## 2023-08-21 MED ORDER — PROCHLORPERAZINE MALEATE 10 MG PO TABS: 5.0000 mg | ORAL_TABLET | Freq: Four times a day (QID) | ORAL | Status: DC | PRN

## 2023-08-21 MED ORDER — ZOLBETUXIMAB-CLZB CHEMO 100MG/5ML IV SOLN
380.0000 mg/m2 | Freq: Once | INTRAVENOUS | Status: AC
  Administered 2023-08-21: 800 mg via INTRAVENOUS
  Filled 2023-08-21: qty 40

## 2023-08-21 MED ORDER — LORAZEPAM 2 MG/ML IJ SOLN
0.5000 mg | Freq: Once | INTRAMUSCULAR | Status: AC | PRN
  Administered 2023-08-21: 0.5 mg via INTRAVENOUS
  Filled 2023-08-21: qty 1

## 2023-08-21 MED ORDER — SODIUM CHLORIDE 0.9 % IV SOLN
2400.0000 mg/m2 | INTRAVENOUS | Status: DC
  Administered 2023-08-21: 5000 mg via INTRAVENOUS
  Filled 2023-08-21: qty 100

## 2023-08-21 MED ORDER — SODIUM CHLORIDE 0.9 % IV SOLN
150.0000 mg | Freq: Once | INTRAVENOUS | Status: AC
  Administered 2023-08-21: 150 mg via INTRAVENOUS
  Filled 2023-08-21: qty 150

## 2023-08-21 MED ORDER — FAMOTIDINE IN NACL 20-0.9 MG/50ML-% IV SOLN
20.0000 mg | Freq: Once | INTRAVENOUS | Status: AC
  Administered 2023-08-21: 20 mg via INTRAVENOUS
  Filled 2023-08-21: qty 50

## 2023-08-21 NOTE — Progress Notes (Signed)
 C1D15 Zolbetuximab 800 mg dose was tolerated well by starting at 25 mg/m2/hr (10 ml/hr) and increased it 10 ml/hr each 30 min. Reached rate of 150 mg/m2/hr and had no nausea. Bumped to 170 mg/m2/hr and had nausea, no vomiting. RN stopped inf., gave PO Compazine  and pt felt better and restarted it. Able to tolerate to MAX 200 mg/m2/hr.   25mg /m2/hr= 10 ml/hr 50mg /m2/hr= 20 ml/hr 75mg /m2/hr= 30 ml/hr 100mg /m2/hr= 40 ml/hr 125mg /m2/hr= 50 ml/hr 150mg /m2/hr= 60 ml/hr 175mg /m2/hr= 70 ml/hr 200mg /m2/hr= 80 ml/hr  Kara Orts, PharmD, MBA

## 2023-08-21 NOTE — Patient Instructions (Signed)
 [facility] - A DEPT OF [facility]  Discharge Instructions: Thank you for choosing [facility] to provide your oncology and hematology care.   If you have a lab appointment with the Cancer Center, please go directly to the Cancer Center and check in at the registration area.   Wear comfortable clothing and clothing appropriate for easy access to any Portacath or PICC line.   We strive to give you quality time with your provider. You may need to reschedule your appointment if you arrive late (15 or more minutes).  Arriving late affects you and other patients whose appointments are after yours.  Also, if you miss three or more appointments without notifying the office, you may be dismissed from the clinic at the provider's discretion.      For prescription refill requests, have your pharmacy contact our office and allow 72 hours for refills to be completed.    Today you received the following chemotherapy and/or immunotherapy agents: Zolbetuximab (Vyloy), Adrucil        To help prevent nausea and vomiting after your treatment, we encourage you to take your nausea medication as directed.  BELOW ARE SYMPTOMS THAT SHOULD BE REPORTED IMMEDIATELY: *FEVER GREATER THAN 100.4 F (38 C) OR HIGHER *CHILLS OR SWEATING *NAUSEA AND VOMITING THAT IS NOT CONTROLLED WITH YOUR NAUSEA MEDICATION *UNUSUAL SHORTNESS OF BREATH *UNUSUAL BRUISING OR BLEEDING *URINARY PROBLEMS (pain or burning when urinating, or frequent urination) *BOWEL PROBLEMS (unusual diarrhea, constipation, pain near the anus) TENDERNESS IN MOUTH AND THROAT WITH OR WITHOUT PRESENCE OF ULCERS (sore throat, sores in mouth, or a toothache) UNUSUAL RASH, SWELLING OR PAIN  UNUSUAL VAGINAL DISCHARGE OR ITCHING   Items with * indicate a potential emergency and should be followed up as soon as possible or go to the Emergency Department if any problems should occur.  Please show the CHEMOTHERAPY ALERT  CARD or IMMUNOTHERAPY ALERT CARD at check-in to the Emergency Department and triage nurse.  Should you have questions after your visit or need to cancel or reschedule your appointment, please contact [facility] - A DEPT OF [facility]  Dept: [phone]  and follow the prompts.  Office hours are 8:00 a.m. to 4:30 p.m. Monday - Friday. Please note that voicemails left after 4:00 p.m. may not be returned until the following business day.  We are closed weekends and major holidays. You have access to a nurse at all times for urgent questions. Please call the main number to the clinic Dept: [phone] and follow the prompts.   For any non-urgent questions, you may also contact your provider using MyChart. We now offer e-Visits for anyone 44 and older to request care online for non-urgent symptoms. For details visit [email or web address].   Also download the MyChart app! Go to the app store, search "MyChart", open the app, select [facility], and log in with your MyChart username and password.

## 2023-08-21 NOTE — Progress Notes (Signed)
 Nutrition Follow-up:  Patient with gastric cancer. She is currently receiving Zolbetuximab/5FU + Leucovorin  q42d (start 07/09/23)  Last seen by this RD 12/19/22  Met with patient in infusion. She is drowsy this morning s/p ativan . Patient reports having a great day yesterday after finally having a bowel movement. She feels nausea and fever were related to severe constipation. Patient is on bowel regimen. Appetite is getting better. She does not provide recall today.    Medications: klor-con , phenergan   Labs: glucose 175  Anthropometrics: Wt 190 lb 7.6 oz today decreased 9% in 6 weeks - this is severe for time frame  3/11 - 209 lb 7 oz   NUTRITION DIAGNOSIS: Unintended wt loss - ongoing    INTERVENTION:  Encourage small frequent meals/snacks with adequate calories and protein  Bowel regimen per MD     MONITORING, EVALUATION, GOAL: wt trends, intake   NEXT VISIT: Wednesday May 7 during infusion

## 2023-08-21 NOTE — Progress Notes (Deleted)
 Vyloy titrations determined based on previous tolerance per pharmacy consultation:

## 2023-08-21 NOTE — Progress Notes (Signed)
 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   -she started first line chemo FLOT on 1/10 -She understands that chemotherapy is palliative, to prolong her life.  We are unlikely going to cure her cancer. -PD-L1 0-1%, no significant benefit from PD-L1 immunotherapy, FO revealed FGFR2 amplification and fusion (+), FGFR inhibitors can be considered in future, no other targeted therapy available  -She has been tolerating chemo very well, will continue for now  -PET scan from 05/30/2022 was negative for primary tumor or metastatic disease, the known peritoneal mets did not show on PET.  -she has been tolerating chemo well overall. Due to fatigue, I have changed her chemo from FLOT to FOLFOX on 06/20/2022, she tolerated well -she previously asked the role of surgery, depends on her next restaging CT scan findings, I may refer her to [name] or [name] to discuss HIPEC surgery  -due to her infusion reaction to oxaliplatin  on C5, we added additional premeds and gave slow infusion over 4 hours for cycle 6 and she tolerated well  -She is not able to return to work due to the cancer and treatment related symptoms.  -She is tolerating FOLFOX well overall, with moderate fatigue for a few days after infusion but able to recover well.  No signs of neuropathy at this point. -Restaging CT abdomen pelvis from August 27, 2022  showed no residual disease.  -We again discussed maintenance therapy with Xeloda  down the road, we will stop oxaliplatin  when she develops side effects especially neuropathy, or after next scan -repeated staging CT from 11/26/2021 showed stable disease  -I have changed her treatment to maintenance Xeloda  in early August 2024, she is tolerating well overall  -her NGS Caris showed positive Claudin 18.2, she is a candidate for zolbetuximab.  -Repeated EGD on March 21, 2023 showed residual gastric cancer.  We discussed option of changing her chemotherapy back to FOLFOX and add zolbetuximab.  -PET 06/03/2023 showed stable disease -Patient developed recurrent abdominal pain, similar to the symptoms she had when she was diagnosed.  She agreed to change treatment back to FOLFOX, and add Zolbetuximab. She started on 07/09/2023. She tolerated first cycle poorly and had prolonged recovery. Oxaliplatin  was stop after cycle 1.    Assessment & Plan Gastric cancer Gastric cancer, currently undergoing chemotherapy. Reports improvement in nausea and vomiting compared to previous cycles, with no vomiting in the last cycle. Constipation noted, possibly contributing to nausea. Weight increased by one pound since last week. No abdominal pain reported. Potassium levels are well-managed with supplementation. - Continue current chemotherapy regimen. - Advise continuation of potassium supplementation as tolerated. - Schedule next chemotherapy cycle for Sep 04, 2023. - Skip chemotherapy cycle on Sep 18, 2023, and resume on October 02, 2023. - Plan to repeat scan after cycle six.  Plan - She has recovered reasonably well from last cycle of chemotherapy, lab reviewed, adequate for treatment, will proceed to cycle 3 chemo with 5-FU/leucovorin  and zolbetuximab and  continue every 2 weeks -f/u in 2 weeks    SUMMARY OF ONCOLOGIC HISTORY: Oncology History Overview Note   Cancer Staging  Gastric cancer [facility]) Staging form:  Stomach, AJCC 8th Edition - Clinical stage from 04/19/2022: Stage IVB (cT2, cN0, pM1) - Signed by Sheryl Satartia, MD on 05/08/2022 Total positive nodes: 0     Gastric cancer (HCC)  03/30/2022 Procedure   EGD:  Impression:  - Normal esophagus. - A few gastric polyps. Biopsied. - Gastritis. Biopsied. - Non-bleeding gastric ulcer with no stigmata of bleeding. Biopsied. - Normal examined duodenum. Biopsied.  Findings: Diffuse moderate inflammation characterized by congestion (edema), friability and granularity was found in the cardia, in the gastric fundus and in the gastric body. There were associated erosions in multiple places. Biopsies were taken from the antrum, body, and fundus with a cold forceps for histology. Estimated blood loss was minimal.  One non-bleeding cratered gastric ulcer with no stigmata of bleeding was found on the greater curvature of the stomach. The lesion was 6 mm in largest dimension. The mucosa around the ulcer was heaped and led to some deformity in the antrum. Biopsies were taken with a cold forceps for histology. Estimated blood loss was minimal.    03/30/2022 Pathology Results   Patient: [name]  Accession: [id number]  Diagnosis 1. Surgical [[name]], duodenal - BENIGN SMALL BOWEL MUCOSA WITH NO SIGNIFICANT PATHOLOGIC CHANGES 2. Surgical [[name]], gastric antrum - GASTRIC ANTRAL MUCOSA WITH FEATURES OF REACTIVE GASTROPATHY - NEGATIVE FOR H. PYLORI ON H&E STAIN - NEGATIVE FOR INTESTINAL METAPLASIA OR MALIGNANCY 3. Surgical [[name]], gastric body - GASTRIC OXYNTIC MUCOSA WITH REACTIVE/REPARATIVE CHANGES - NEGATIVE FOR H. PYLORI ON H&E STAIN - NEGATIVE FOR INTESTINAL METAPLASIA, DYSPLASIA OR MALIGNANCY 4. Surgical [[name]], greater curve ulceration - ADENOCARCINOMA WITH SIGNET RING CELL FEATURES (SEE NOTE) 5. Surgical [[name]], gastric polyps - ADENOCARCINOMA WITH SIGNET RING CELL FEATURES (SEE NOTE) 6. Surgical [[name]], fundus (gastric) - ADENOCARCINOMA WITH SIGNET RING  CELL FEATURES (SEE NOTE) 7. Surgical [[name]], colon, ascending, polyp (1) - TUBULAR ADENOMA. - NO HIGH GRADE DYSPLASIA OR MALIGNANCY. 8. Surgical [[name]], colon, transverse, polyp (1) - TUBULAR ADENOMA. - NO HIGH GRADE DYSPLASIA OR MALIGNANCY.    04/13/2022 Initial Diagnosis   Gastric cancer (HCC)   04/19/2022 Cancer Staging   Staging form: Stomach, AJCC 8th Edition - Clinical stage from 04/19/2022: Stage IVB (cT2, cN0, pM1) - Signed by Sheryl [place], MD on 05/08/2022 Total positive nodes: 0   05/05/2022 Genetic Testing   Negative genetic testing on the Multi-cancer gene panel + RNA.  FH c.259C>T VUS identified.  The report date is May 05, 2022.  The Multi-Cancer + RNA Panel offered by [facility] includes sequencing and/or deletion/duplication analysis of the following 70 genes:  AIP*, ALK, APC*, ATM*, AXIN2*, BAP1*, BARD1*, BLM*, BMPR1A*, BRCA1*, BRCA2*, BRIP1*, CDC73*, CDH1*, CDK4, CDKN1B*, CDKN2A, CHEK2*, CTNNA1*, DICER1*, EPCAM (del/dup only), EGFR, FH*, FLCN*, GREM1 (promoter dup only), HOXB13, KIT, LZTR1, MAX*, MBD4, MEN1*, MET, MITF, MLH1*, MSH2*, MSH3*, MSH6*, MUTYH*, NF1*, NF2*, NTHL1*, PALB2*, PDGFRA, PMS2*, POLD1*, POLE*, POT1*, PRKAR1A*, PTCH1*, PTEN*, RAD51C*, RAD51D*, RB1*, RET, SDHA* (sequencing only), SDHAF2*, SDHB*, SDHC*, SDHD*, SMAD4*, SMARCA4*, SMARCB1*, SMARCE1*, STK11*, SUFU*, TMEM127*, TP53*, TSC1*, TSC2*, VHL*. RNA analysis is performed for * genes.    05/09/2022 - 06/07/2022 Chemotherapy   Patient is on Treatment Plan : GASTROESOPHAGEAL FLOT q14d X 4 cycles      Miscellaneous   Foundation One  Biomarker Findings Microsatellite status- Cannot be determined Tumor Mutational Burden- Cannot be determined  Genomic Findings  FGFR2 amplification,FGFR2-TACC2 fusion,  Rearrangement intron 17 ARAF amplification CCND3 amplification TP53 V230fs*74     05/30/2022 Imaging    IMPRESSION: 1. Mild hypermetabolism corresponding to a dominant left upper quadrant mass and smaller  perigastric nodules or nodes. 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 Recommend attention to the dominant left upper quadrant soft tissue mass on follow-up exams to exclude unlikely recurrent lymphoma. 2. No gastric hypermetabolism and no typical findings of metastatic disease.   06/20/2022 - 11/16/2022 Chemotherapy   Patient is on Treatment Plan : GASTRIC FOLFOX q14d x 12 cycles     08/27/2022 Imaging    IMPRESSION: No focal gastric mass on CT.   No findings suspicious for recurrent or metastatic disease.   Stable left upper abdominal soft tissue lesion and small lymph nodes, chronic, favoring treated lymphoma.   11/27/2022 Imaging    IMPRESSION: 1. Questionable thickening of the distal esophagus/GE junction and gastric antrum, consider further evaluation with endoscopy. 2. Chronically stable left upper quadrant nodularity and prominent lymph nodes again favored treated lymphoma. Continued attention on follow-up imaging suggested. 3. No convincing evidence of metastatic disease in the chest, abdomen or pelvis. 4. Questionable asymmetric wall thickening of the rectum, consider further evaluation with colonoscopy. 5. Mild wall thickening of a nondistended urinary bladder, correlate with urinalysis to exclude cystitis. 6. Hepatic steatosis.   07/10/2023 -  Chemotherapy   Patient is on Treatment Plan : GASTROESOPHAGEAL Zolbetuximab (800/400) + FOLFOX D1,15,29 q42d x 4 cycles / Zolbetuximab (400) + 5FU + Leucovorin  D1,15,29 q42d        Discussed the use of AI scribe software for clinical note transcription with the patient, who gave verbal consent to proceed.  History of Present Illness A 58 year old patient with a history of gastric cancer presents for a follow-up visit after her last cycle of chemotherapy. The patient reports that the nausea and vomiting experienced after the first cycle of chemotherapy have improved significantly after the  last cycle. She attributes this improvement to the cessation of marijuana use, which she believes was causing constipation and contributing to the nausea and vomiting. Despite the improvement in nausea and vomiting, the patient reports a weight loss of four pounds since her last visit a week ago. However, she has since gained one pound back. The patient also reports that the pain in the stomach area, which was present before, has now resolved.     All other systems were reviewed with the patient and are negative.  MEDICAL HISTORY:  Past Medical History:  Diagnosis Date   Blood transfusion without reported diagnosis    had transfusion with hysterectomy   Cataract    Colon polyps 2012   Diabetes (HCC) 03/13/2021   Diabetes (HCC) 05/21/2019   Family history of breast cancer    Family history of pancreatic cancer    Family history of stomach cancer    Fibroid    gastric ca 03/2022   GERD (gastroesophageal reflux disease)    H/O blood clots    History of hysterectomy    fibroids and heavy cycles   Hypertension     SURGICAL HISTORY: Past Surgical History:  Procedure Laterality Date   ABDOMINAL HYSTERECTOMY     BIOPSY  04/19/2022   Procedure: BIOPSY;  Surgeon: Sheryl [name]., MD;  Location: [facility];  Service: Gastroenterology;;   BIOPSY  03/21/2023   Procedure: BIOPSY;  Surgeon: [facility]yl [name]., MD;  Location: [facility];  Service:  Gastroenterology;;   COLONOSCOPY     ESOPHAGOGASTRODUODENOSCOPY (EGD) WITH PROPOFOL  N/A 04/19/2022   Procedure: ESOPHAGOGASTRODUODENOSCOPY (EGD) WITH PROPOFOL ;  Surgeon: Sheryl Campi Albino Alu., MD;  Location: [facility];  Service: Gastroenterology;  Laterality: N/A;   ESOPHAGOGASTRODUODENOSCOPY (EGD) WITH PROPOFOL  N/A 03/21/2023   Procedure: ESOPHAGOGASTRODUODENOSCOPY (EGD) WITH PROPOFOL ;  Surgeon: Sheryl Campi Albino Alu., MD;  Location: [facility];  Service: Gastroenterology;  Laterality: N/A;   EUS N/A 04/19/2022    Procedure: UPPER ENDOSCOPIC ULTRASOUND (EUS) RADIAL;  Surgeon: Sheryl [name]., MD;  Location: [facility];  Service: Gastroenterology;  Laterality: N/A;   EXCISION OF SKIN TAG  05/03/2022   Procedure: EXCISION OF CHEST WALL SKIN LESION;  Surgeon: Sheryl Sake, MD;  Location: [facility];  Service: General;;   LAPAROSCOPY N/A 05/03/2022   Procedure: LAPAROSCOPY DIAGNOSTIC WITH PERITONEAL WASHINGS;  Surgeon: Sheryl Sake, MD;  Location: [facility];  Service: General;  Laterality: N/A;   POLYPECTOMY  04/19/2022   Procedure: POLYPECTOMY;  Surgeon: Sheryl [name]., MD;  Location: [facility];  Service: Gastroenterology;;   PORTACATH PLACEMENT N/A 05/03/2022   Procedure: INSERTION PORT-A-CATH WITH ULTRASOUND GUIDANCE;  Surgeon: Sheryl Sake, MD;  Location: [facility];  Service: General;  Laterality: N/A;   UPPER GASTROINTESTINAL ENDOSCOPY      I have reviewed the social history and family history with the patient and they are unchanged from previous note.  ALLERGIES:  is allergic to aspirin, cyclobenzaprine, naproxen sodium, zithromax [azithromycin dihydrate], oxaliplatin , and dilaudid [hydromorphone].  MEDICATIONS:  Current Outpatient Medications  Medication Sig Dispense Refill   acetaminophen  (TYLENOL ) 500 MG tablet Take 2 tablets (1,000 mg total) by mouth every 8 (eight) hours as needed (pain). 30 tablet 1   amLODipine  (NORVASC ) 10 MG tablet Take 1 tablet (10 mg total) by mouth daily. 30 tablet 2   b complex vitamins capsule Take 1 capsule by mouth daily.     Cyanocobalamin (VITAMIN B 12 PO) Take by mouth.     dexamethasone  (DECADRON ) 4 MG tablet Take 2 tabs daily for 2-3 days then 1 tab daily for additional 2-4 days, total no more than 7 days 10 tablet 0   famotidine  (PEPCID ) 20 MG tablet Take 1 tablet (20 mg total) by mouth 2 (two) times daily. 60 tablet 2   HYDROcodone -acetaminophen  (NORCO/VICODIN) 5-325 MG tablet Take 1 tablet by mouth every 6 (six) hours as needed for moderate pain  (pain score 4-6). 20 tablet 0   lidocaine -prilocaine  (EMLA ) cream Apply 1 Application topically as needed. 30 g 1   ondansetron  (ZOFRAN ) 8 MG tablet Take 1 tablet (8 mg total) by mouth every 8 (eight) hours as needed for nausea or vomiting. 30 tablet 2   potassium chloride  SA (KLOR-CON  M) 20 MEQ tablet Take 2 tablets (40 mEq total) by mouth 2 (two) times daily for 5 days, THEN 1 tablet (20 mEq total) 2 (two) times daily for 25 days. 80 tablet 0   POTASSIUM PO Take 99 mg by mouth daily.     prochlorperazine  (COMPAZINE ) 10 MG tablet Take 1 tablet (10 mg total) by mouth every 6 (six) hours as needed for nausea or vomiting. 30 tablet 2   promethazine  (PHENERGAN ) 25 MG tablet Take 1 tablet (25 mg total) by mouth every 6 (six) hours as needed for nausea or vomiting. 30 tablet 0   scopolamine  (TRANSDERM-SCOP) 1 MG/3DAYS Place 1 patch (1.5 mg total) onto the skin every 3 (three) days. 10 patch 1   sucralfate  (CARAFATE ) 1 g tablet Take 1 tablet (  1 g total) by mouth 2 (two) times daily. 60 tablet 6   No current facility-administered medications for this visit.   Facility-Administered Medications Ordered in Other Visits  Medication Dose Route Frequency Provider Last Rate Last Admin   0.9 %  sodium chloride  infusion   Intravenous Continuous Sheryl [place], MD 10 mL/hr at 08/21/23 0904 New Bag at 08/21/23 0904   fluorouracil  (ADRUCIL ) 5,000 mg in sodium chloride  0.9 % 150 mL chemo infusion  2,400 mg/m2 (Treatment Plan Recorded) Intravenous 1 day or 1 dose Sheryl [place], MD       heparin  lock flush 100 unit/mL  500 Units Intracatheter Once PRN Sheryl [place], MD       leucovorin  844 mg in sodium chloride  0.9 % 250 mL infusion  400 mg/m2 (Treatment Plan Recorded) Intravenous Once Sheryl [place], MD 620 mL/hr at 08/21/23 1501 844 mg at 08/21/23 1501   prochlorperazine  (COMPAZINE ) tablet 5 mg  5 mg Oral Q6H PRN Sheryl [place], MD       sodium chloride  flush (NS) 0.9 % injection 10 mL  10 mL Intracatheter PRN Sheryl [place], MD         PHYSICAL EXAMINATION: ECOG PERFORMANCE STATUS: 1 - Symptomatic but completely Sheryl  There were no vitals filed for this visit. Wt Readings from Last 3 Encounters:  08/21/23 190 lb 7.6 oz (86.4 kg)  08/16/23 198 lb 1.6 oz (89.9 kg)  08/16/23 189 lb 1.6 oz (85.8 kg)     GENERAL:alert, no distress and comfortable SKIN: skin color, texture, turgor are normal, no rashes or significant lesions EYES: normal, Conjunctiva are pink and non-injected, sclera clear NECK: supple, thyroid  normal size, non-tender, without nodularity LYMPH:  no palpable lymphadenopathy in the cervical, axillary  LUNGS: clear to auscultation and percussion with normal breathing effort HEART: regular rate & rhythm and no murmurs and no lower extremity edema ABDOMEN:abdomen soft, non-tender and normal bowel sounds Musculoskeletal:no cyanosis of digits and no clubbing  NEURO: alert & oriented x 3 with fluent speech, no focal motor/sensory deficits  Physical Exam   LABORATORY DATA:  I have reviewed the data as listed    Latest Ref Rng & Units 08/21/2023    7:48 AM 08/16/2023    9:21 AM 08/02/2023    9:16 AM  CBC  WBC 4.0 - 10.5 K/uL 3.0  4.2  3.7   Hemoglobin 12.0 - 15.0 g/dL 91.4  78.2  95.6   Hematocrit 36.0 - 46.0 % 36.5  37.5  38.2   Platelets 150 - 400 K/uL 252  294  210         Latest Ref Rng & Units 08/21/2023    7:48 AM 08/16/2023    9:21 AM 08/02/2023    9:16 AM  CMP  Glucose 70 - 99 mg/dL 213  086  578   BUN 6 - 20 mg/dL 6  9  9    Creatinine 0.44 - 1.00 mg/dL 4.69  6.29  5.28   Sodium 135 - 145 mmol/L 137  137  138   Potassium 3.5 - 5.1 mmol/L 3.6  3.1  3.6   Chloride 98 - 111 mmol/L 104  101  102   CO2 22 - 32 mmol/L 26  29  29    Calcium  8.9 - 10.3 mg/dL 8.5  8.6  8.7   Total Protein 6.5 - 8.1 g/dL 6.0  5.9  5.9   Total Bilirubin 0.0 - 1.2 mg/dL 0.8  0.5  0.6   Alkaline Phos 38 - 126 U/L  91  87  87   AST 15 - 41 U/L 20  21  33   ALT 0 - 44 U/L 13  18  30        RADIOGRAPHIC  STUDIES: I have personally reviewed the radiological images as listed and agreed with the findings in the report. No results found.    No orders of the defined types were placed in this encounter.  All questions were answered. The patient knows to call the clinic with any problems, questions or concerns. No barriers to learning was detected. The total time spent in the appointment was 25 minutes.     Sheryl [place], MD 08/21/2023

## 2023-08-22 ENCOUNTER — Encounter

## 2023-08-23 ENCOUNTER — Encounter

## 2023-08-23 ENCOUNTER — Inpatient Hospital Stay

## 2023-08-23 VITALS — BP 111/67 | HR 73 | Temp 98.6°F | Resp 19

## 2023-08-23 DIAGNOSIS — C162 Malignant neoplasm of body of stomach: Secondary | ICD-10-CM

## 2023-08-23 DIAGNOSIS — Z95828 Presence of other vascular implants and grafts: Secondary | ICD-10-CM

## 2023-08-23 DIAGNOSIS — Z5112 Encounter for antineoplastic immunotherapy: Secondary | ICD-10-CM | POA: Diagnosis not present

## 2023-08-23 MED ORDER — SODIUM CHLORIDE 0.9% FLUSH
10.0000 mL | Freq: Once | INTRAVENOUS | Status: AC
  Administered 2023-08-23: 10 mL

## 2023-08-23 MED ORDER — SODIUM CHLORIDE 0.9 % IV SOLN: Freq: Once | INTRAVENOUS | Status: AC

## 2023-08-23 MED ORDER — HEPARIN SOD (PORK) LOCK FLUSH 100 UNIT/ML IV SOLN
500.0000 [IU] | Freq: Once | INTRAVENOUS | Status: AC
  Administered 2023-08-23: 500 [IU]

## 2023-08-23 NOTE — Patient Instructions (Signed)

## 2023-08-26 ENCOUNTER — Other Ambulatory Visit: Payer: Self-pay | Admitting: Nurse Practitioner

## 2023-08-26 DIAGNOSIS — I10 Essential (primary) hypertension: Secondary | ICD-10-CM

## 2023-08-27 ENCOUNTER — Other Ambulatory Visit: Payer: Self-pay

## 2023-08-27 MED ORDER — AMLODIPINE BESYLATE 10 MG PO TABS
10.0000 mg | ORAL_TABLET | Freq: Every day | ORAL | 2 refills | Status: DC
Start: 2023-08-27 — End: 2023-11-26
  Filled 2023-08-27: qty 30, 30d supply, fill #0
  Filled 2023-09-24: qty 30, 30d supply, fill #1
  Filled 2023-10-27: qty 30, 30d supply, fill #2

## 2023-08-28 ENCOUNTER — Other Ambulatory Visit

## 2023-08-28 ENCOUNTER — Other Ambulatory Visit: Payer: Self-pay

## 2023-08-28 ENCOUNTER — Ambulatory Visit

## 2023-08-28 ENCOUNTER — Ambulatory Visit: Admitting: Nurse Practitioner

## 2023-08-30 ENCOUNTER — Encounter

## 2023-08-30 ENCOUNTER — Telehealth: Payer: Self-pay | Admitting: Hematology

## 2023-09-03 ENCOUNTER — Other Ambulatory Visit

## 2023-09-03 ENCOUNTER — Ambulatory Visit

## 2023-09-03 ENCOUNTER — Ambulatory Visit: Admitting: Nurse Practitioner

## 2023-09-03 MED FILL — Fosaprepitant Dimeglumine For IV Infusion 150 MG (Base Eq): INTRAVENOUS | Qty: 5 | Status: AC

## 2023-09-03 NOTE — Assessment & Plan Note (Signed)
 [id number] with peritoneal metastasis. 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   -she started first line chemo FLOT on 1/10 -She understands that chemotherapy is palliative, to prolong her life.  We are unlikely going to cure her cancer. -PD-L1 0-1%, no significant benefit from PD-L1 immunotherapy, FO revealed FGFR2 amplification and fusion (+), FGFR inhibitors can be considered in future, no other targeted therapy available  -She has been tolerating chemo very well, will continue for now  -PET scan from 05/30/2022 was negative for primary tumor or metastatic disease, the known peritoneal mets did not show on PET.  -she has been tolerating chemo well overall. Due to fatigue, I have changed her chemo from FLOT to FOLFOX on 06/20/2022, she tolerated well -she previously asked the role of surgery, depends on her next restaging CT scan findings, I may refer her to [facility] or [facility] to discuss HIPEC surgery  -due to her infusion reaction to oxaliplatin  on C5, we added additional premeds and gave slow infusion over 4 hours for cycle 6 and she tolerated well  -She is not able to return to work due to the cancer and treatment related symptoms.  -She is tolerating FOLFOX well overall, with moderate fatigue for a few days after infusion but able to recover well.  No signs of neuropathy at this point. -Restaging CT abdomen pelvis from August 27, 2022 showed no residual disease.  -We again discussed maintenance therapy with Xeloda  down the road, we will stop oxaliplatin  when she develops side effects especially neuropathy, or after next scan -repeated staging CT from 11/26/2021 showed stable disease  -I have changed her treatment to maintenance Xeloda  in early August 2024, she is tolerating well overall  -her NGS Caris showed positive Claudin 18.2, she is a  candidate for zolbetuximab.  -Repeated EGD on March 21, 2023 showed residual gastric cancer.  We discussed option of changing her chemotherapy back to FOLFOX and add zolbetuximab.  -PET 06/03/2023 showed stable disease -Patient developed recurrent abdominal pain, similar to the symptoms she had when she was diagnosed.  She agreed to change treatment back to FOLFOX, and add Zolbetuximab. She started on 07/09/2023. She tolerated first cycle poorly and had prolonged recovery. Oxaliplatin  was stop after cycle 1. She continued 5-fu/LV and zolbe every 2 weeks

## 2023-09-04 ENCOUNTER — Ambulatory Visit: Admitting: Hematology

## 2023-09-04 ENCOUNTER — Inpatient Hospital Stay

## 2023-09-04 ENCOUNTER — Other Ambulatory Visit: Payer: Self-pay

## 2023-09-04 ENCOUNTER — Inpatient Hospital Stay: Attending: Physician Assistant | Admitting: Hematology

## 2023-09-04 ENCOUNTER — Encounter: Payer: Self-pay | Admitting: Hematology

## 2023-09-04 ENCOUNTER — Inpatient Hospital Stay: Attending: Physician Assistant

## 2023-09-04 ENCOUNTER — Inpatient Hospital Stay: Admitting: Dietician

## 2023-09-04 ENCOUNTER — Other Ambulatory Visit

## 2023-09-04 ENCOUNTER — Ambulatory Visit

## 2023-09-04 VITALS — BP 126/80 | HR 78 | Temp 98.9°F | Resp 16 | Wt 183.5 lb

## 2023-09-04 DIAGNOSIS — Z5112 Encounter for antineoplastic immunotherapy: Secondary | ICD-10-CM | POA: Insufficient documentation

## 2023-09-04 DIAGNOSIS — E876 Hypokalemia: Secondary | ICD-10-CM | POA: Diagnosis not present

## 2023-09-04 DIAGNOSIS — C162 Malignant neoplasm of body of stomach: Secondary | ICD-10-CM | POA: Diagnosis not present

## 2023-09-04 DIAGNOSIS — C786 Secondary malignant neoplasm of retroperitoneum and peritoneum: Secondary | ICD-10-CM | POA: Diagnosis not present

## 2023-09-04 DIAGNOSIS — C169 Malignant neoplasm of stomach, unspecified: Secondary | ICD-10-CM | POA: Insufficient documentation

## 2023-09-04 DIAGNOSIS — R112 Nausea with vomiting, unspecified: Secondary | ICD-10-CM | POA: Diagnosis not present

## 2023-09-04 DIAGNOSIS — Z5111 Encounter for antineoplastic chemotherapy: Secondary | ICD-10-CM | POA: Insufficient documentation

## 2023-09-04 DIAGNOSIS — Z95828 Presence of other vascular implants and grafts: Secondary | ICD-10-CM

## 2023-09-04 LAB — CBC WITH DIFFERENTIAL (CANCER CENTER ONLY)
Abs Immature Granulocytes: 0 10*3/uL (ref 0.00–0.07)
Basophils Absolute: 0 10*3/uL (ref 0.0–0.1)
Basophils Relative: 1 %
Eosinophils Absolute: 0.5 10*3/uL (ref 0.0–0.5)
Eosinophils Relative: 17 %
HCT: 36.7 % (ref 36.0–46.0)
Hemoglobin: 13.4 g/dL (ref 12.0–15.0)
Immature Granulocytes: 0 %
Lymphocytes Relative: 31 %
Lymphs Abs: 0.9 10*3/uL (ref 0.7–4.0)
MCH: 31.5 pg (ref 26.0–34.0)
MCHC: 36.5 g/dL — ABNORMAL HIGH (ref 30.0–36.0)
MCV: 86.2 fL (ref 80.0–100.0)
Monocytes Absolute: 0.4 10*3/uL (ref 0.1–1.0)
Monocytes Relative: 13 %
Neutro Abs: 1.1 10*3/uL — ABNORMAL LOW (ref 1.7–7.7)
Neutrophils Relative %: 38 %
Platelet Count: 291 10*3/uL (ref 150–400)
RBC: 4.26 MIL/uL (ref 3.87–5.11)
RDW: 14 % (ref 11.5–15.5)
WBC Count: 2.8 10*3/uL — ABNORMAL LOW (ref 4.0–10.5)
nRBC: 0 % (ref 0.0–0.2)

## 2023-09-04 LAB — CMP (CANCER CENTER ONLY)
ALT: 12 U/L (ref 0–44)
AST: 18 U/L (ref 15–41)
Albumin: 3.6 g/dL (ref 3.5–5.0)
Alkaline Phosphatase: 88 U/L (ref 38–126)
Anion gap: 7 (ref 5–15)
BUN: 8 mg/dL (ref 6–20)
CO2: 28 mmol/L (ref 22–32)
Calcium: 8.9 mg/dL (ref 8.9–10.3)
Chloride: 102 mmol/L (ref 98–111)
Creatinine: 0.72 mg/dL (ref 0.44–1.00)
GFR, Estimated: >60 mL/min (ref >60)
Glucose, Bld: 133 mg/dL — ABNORMAL HIGH (ref 70–99)
Potassium: 3.2 mmol/L — ABNORMAL LOW (ref 3.5–5.1)
Sodium: 137 mmol/L (ref 135–145)
Total Bilirubin: 0.5 mg/dL (ref 0.0–1.2)
Total Protein: 6.2 g/dL — ABNORMAL LOW (ref 6.5–8.1)

## 2023-09-04 MED ORDER — SODIUM CHLORIDE 0.9 % IV SOLN
2100.0000 mg/m2 | INTRAVENOUS | Status: DC
  Administered 2023-09-04: 4450 mg via INTRAVENOUS
  Filled 2023-09-04: qty 89

## 2023-09-04 MED ORDER — SODIUM CHLORIDE 0.9 % IV SOLN
INTRAVENOUS | Status: DC
Start: 2023-09-04 — End: 2023-09-04

## 2023-09-04 MED ORDER — ZOLBETUXIMAB-CLZB CHEMO 100MG/5ML IV SOLN
700.0000 mg | Freq: Once | INTRAVENOUS | Status: AC
  Administered 2023-09-04: 700 mg via INTRAVENOUS
  Filled 2023-09-04: qty 35

## 2023-09-04 MED ORDER — LORAZEPAM 2 MG/ML IJ SOLN
0.5000 mg | Freq: Once | INTRAMUSCULAR | Status: AC | PRN
  Administered 2023-09-04: 0.5 mg via INTRAVENOUS
  Filled 2023-09-04: qty 1

## 2023-09-04 MED ORDER — OLANZAPINE 5 MG PO TABS
5.0000 mg | ORAL_TABLET | Freq: Once | ORAL | Status: AC
  Administered 2023-09-04: 5 mg via ORAL
  Filled 2023-09-04: qty 1

## 2023-09-04 MED ORDER — PROCHLORPERAZINE MALEATE 10 MG PO TABS
5.0000 mg | ORAL_TABLET | Freq: Four times a day (QID) | ORAL | Status: DC | PRN
Start: 2023-09-04 — End: 2023-09-04
  Administered 2023-09-04: 5 mg via ORAL
  Filled 2023-09-04: qty 1

## 2023-09-04 MED ORDER — DEXAMETHASONE SODIUM PHOSPHATE 10 MG/ML IJ SOLN
10.0000 mg | Freq: Once | INTRAMUSCULAR | Status: AC
  Administered 2023-09-04: 10 mg via INTRAVENOUS
  Filled 2023-09-04: qty 1

## 2023-09-04 MED ORDER — DIPHENHYDRAMINE HCL 25 MG PO CAPS
25.0000 mg | ORAL_CAPSULE | Freq: Once | ORAL | Status: AC
  Administered 2023-09-04: 25 mg via ORAL
  Filled 2023-09-04: qty 1

## 2023-09-04 MED ORDER — POTASSIUM CHLORIDE CRYS ER 20 MEQ PO TBCR
20.0000 meq | EXTENDED_RELEASE_TABLET | Freq: Once | ORAL | Status: AC
  Administered 2023-09-04: 20 meq via ORAL
  Filled 2023-09-04: qty 1

## 2023-09-04 MED ORDER — PALONOSETRON HCL INJECTION 0.25 MG/5ML
0.2500 mg | Freq: Once | INTRAVENOUS | Status: AC
  Administered 2023-09-04: 0.25 mg via INTRAVENOUS
  Filled 2023-09-04: qty 5

## 2023-09-04 MED ORDER — PROMETHAZINE HCL 25 MG PO TABS
25.0000 mg | ORAL_TABLET | Freq: Four times a day (QID) | ORAL | 1 refills | Status: DC | PRN
  Filled 2023-09-04: qty 60, 15d supply, fill #0

## 2023-09-04 MED ORDER — FAMOTIDINE IN NACL 20-0.9 MG/50ML-% IV SOLN
20.0000 mg | Freq: Once | INTRAVENOUS | Status: AC
Start: 2023-09-04 — End: 2023-09-04
  Administered 2023-09-04: 20 mg via INTRAVENOUS
  Filled 2023-09-04: qty 50

## 2023-09-04 MED ORDER — SODIUM CHLORIDE 0.9% FLUSH
10.0000 mL | INTRAVENOUS | Status: DC | PRN
  Administered 2023-09-04: 10 mL

## 2023-09-04 MED ORDER — SODIUM CHLORIDE 0.9 % IV SOLN
150.0000 mg | Freq: Once | INTRAVENOUS | Status: AC
  Administered 2023-09-04: 150 mg via INTRAVENOUS
  Filled 2023-09-04: qty 150

## 2023-09-04 MED ORDER — SODIUM CHLORIDE 0.9 % IV SOLN
400.0000 mg/m2 | Freq: Once | INTRAVENOUS | Status: AC
  Administered 2023-09-04: 844 mg via INTRAVENOUS
  Filled 2023-09-04: qty 42.2

## 2023-09-04 MED ORDER — SODIUM CHLORIDE 0.9% FLUSH
10.0000 mL | Freq: Once | INTRAVENOUS | Status: AC
Start: 2023-09-04 — End: 2023-09-04
  Administered 2023-09-04: 10 mL

## 2023-09-04 NOTE — Patient Instructions (Signed)
 [facility] - A DEPT OF [facility]  Discharge Instructions: Thank you for choosing [facility] to provide your oncology and hematology care.   If you have a lab appointment with the Cancer Center, please go directly to the Cancer Center and check in at the registration area.   Wear comfortable clothing and clothing appropriate for easy access to any Portacath or PICC line.   We strive to give you quality time with your provider. You may need to reschedule your appointment if you arrive late (15 or more minutes).  Arriving late affects you and other patients whose appointments are after yours.  Also, if you miss three or more appointments without notifying the office, you may be dismissed from the clinic at the provider's discretion.      For prescription refill requests, have your pharmacy contact our office and allow 72 hours for refills to be completed.    Today you received the following chemotherapy and/or immunotherapy agents: Vyloy/Leucovorin /Fluorouracil       To help prevent nausea and vomiting after your treatment, we encourage you to take your nausea medication as directed.  BELOW ARE SYMPTOMS THAT SHOULD BE REPORTED IMMEDIATELY: *FEVER GREATER THAN 100.4 F (38 C) OR HIGHER *CHILLS OR SWEATING *NAUSEA AND VOMITING THAT IS NOT CONTROLLED WITH YOUR NAUSEA MEDICATION *UNUSUAL SHORTNESS OF BREATH *UNUSUAL BRUISING OR BLEEDING *URINARY PROBLEMS (pain or burning when urinating, or frequent urination) *BOWEL PROBLEMS (unusual diarrhea, constipation, pain near the anus) TENDERNESS IN MOUTH AND THROAT WITH OR WITHOUT PRESENCE OF ULCERS (sore throat, sores in mouth, or a toothache) UNUSUAL RASH, SWELLING OR PAIN  UNUSUAL VAGINAL DISCHARGE OR ITCHING   Items with * indicate a potential emergency and should be followed up as soon as possible or go to the Emergency Department if any problems should occur.  Please show the CHEMOTHERAPY ALERT  CARD or IMMUNOTHERAPY ALERT CARD at check-in to the Emergency Department and triage nurse.  Should you have questions after your visit or need to cancel or reschedule your appointment, please contact [facility] - A DEPT OF [facility]  Dept: [phone]  and follow the prompts.  Office hours are 8:00 a.m. to 4:30 p.m. Monday - Friday. Please note that voicemails left after 4:00 p.m. may not be returned until the following business day.  We are closed weekends and major holidays. You have access to a nurse at all times for urgent questions. Please call the main number to the clinic Dept: [phone] and follow the prompts.   For any non-urgent questions, you may also contact your provider using MyChart. We now offer e-Visits for anyone 61 and older to request care online for non-urgent symptoms. For details visit [email or web address].   Also download the MyChart app! Go to the app store, search "MyChart", open the app, select [place], and log in with your MyChart username and password.  The chemotherapy medication bag should finish at 46 hours, 96 hours, or 7 days. For example, if your pump is scheduled for 46 hours and it was put on at 4:00 p.m., it should finish at 2:00 p.m. the day it is scheduled to come off regardless of your appointment time.     Estimated time to finish at approximately 1:00 PM on 09/06/23.   If the display on your pump reads "Low Volume" and it is beeping, take the batteries out of the pump and come to the cancer  center for it to be taken off.   If the pump alarms go off prior to the pump reading "Low Volume" then call [phone] and someone can assist you.  If the plunger comes out and the chemotherapy medication is leaking out, please use your home chemo spill kit to clean up the spill. Do NOT use paper towels or other household products.  If you have problems or questions regarding your pump, please call either [phone]  (24 hours a day) or the cancer center Monday-Friday 8:00 a.m.- 4:30 p.m. at the clinic number and we will assist you. If you are unable to get assistance, then go to the nearest Emergency Department and ask the staff to contact the IV team for assistance.

## 2023-09-04 NOTE — Progress Notes (Signed)
 Pt's ANC 1.1 K/uL today. This RN received verbal OK to proceed with tx today with low ANC from Dr. Maryalice Smaller. Per Dr. Maryalice Smaller chemo dose to be reduced today.  Pt's potassium 3.2 mmol/L today. This RN verified Pt is taking potassium daily as prescribed. This RN made Dr. Maryalice Smaller aware.  This RN received v/o from Dr. Maryalice Smaller for oral KCL 20meq to be given in infusion today. Dr. Maryalice Smaller recommended Pt to increase oral K+ by 1 tab a day at home. This RN made Pt aware and Pt verbalized understanding and was agreeable with plan.

## 2023-09-04 NOTE — Progress Notes (Signed)
 Nutrition Follow-up:  Patient with gastric cancer. She is currently receiving reduced dose Zolbetuximab/5FU + Leucovorin  q42d (start 07/09/23)   Met with patient in infusion. Pt father present today. She reports constipation has resolved. Patient has stopped taking zofran  as this exacerbated constipation. She reports appetite has improved. Patient is concerned about wt loss. Yesterday had a grilled cheese for breakfast. Patient reports eating 2 beef tacos (cheese, sour cream, lettuce) for lunch and dinner. She takes a few bites at a time secondary to early satiety. Patient does not like supplements. She is drinking water, juices, and tea. Patient reports episode of vomiting this morning after gagging while brushing teeth.    Medications: reviewed  Labs: glucose 133, K 3.2  Anthropometrics: Wt 183 lb 8 oz today decreased ~4% in 3 weeks - this is severe for time frame  4/23 - 190 lb 7.6 oz  3/11 - 209 lb 7 oz    NUTRITION DIAGNOSIS: Unintended wt loss - ongoing    INTERVENTION:  Educated on smaller more frequent meals, eating within first hour of waking following by po q2-3h Reviewed foods best tolerated and foods to limit/avoid   MONITORING, EVALUATION, GOAL: wt trends, intake   NEXT VISIT: Wednesday June 4 during infusion

## 2023-09-04 NOTE — Progress Notes (Signed)
 Patient stayed for 2 hour post vyloy observation.  VSS.  No s/s or c/o distress or discomfort.  Ambulatory with parent at discharge.

## 2023-09-04 NOTE — Progress Notes (Signed)
 [facility]   [phone] [phone]   Clinic Follow up Note   Patient Care Team: Arcadio Knuckles, MD as PCP - General (Internal Medicine) Sonja [place], MD as Consulting Physician (Oncology)  Date of Service:  09/04/2023  CHIEF COMPLAINT: f/u of gastric cancer  CURRENT THERAPY:  5-FU and zolbetuximab every 2 weeks  Oncology History   Gastric cancer (HCC) cT2N0M1 with peritoneal metastasis. 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   -she started first line chemo FLOT on 1/10 -She understands that chemotherapy is palliative, to prolong her life.  We are unlikely going to cure her cancer. -PD-L1 0-1%, no significant benefit from PD-L1 immunotherapy, FO revealed FGFR2 amplification and fusion (+), FGFR inhibitors can be considered in future, no other targeted therapy available  -She has been tolerating chemo very well, will continue for now  -PET scan from 05/30/2022 was negative for primary tumor or metastatic disease, the known peritoneal mets did not show on PET.  -she has been tolerating chemo well overall. Due to fatigue, I have changed her chemo from FLOT to FOLFOX on 06/20/2022, she tolerated well -she previously asked the role of surgery, depends on her next restaging CT scan findings, I may refer her to [facility] or [facility] to discuss HIPEC surgery  -due to her infusion reaction to oxaliplatin  on C5, we added additional premeds and gave slow infusion over 4 hours for cycle 6 and she tolerated well  -She is not able to return to work due to the cancer and treatment related symptoms.  -She is tolerating FOLFOX well overall, with moderate fatigue for a few days after infusion but able to recover well.  No signs of neuropathy at this point. -Restaging CT abdomen pelvis from August 27, 2022 showed no  residual disease.  -We again discussed maintenance therapy with Xeloda  down the road, we will stop oxaliplatin  when she develops side effects especially neuropathy, or after next scan -repeated staging CT from 11/26/2021 showed stable disease  -I have changed her treatment to maintenance Xeloda  in early August 2024, she is tolerating well overall  -her NGS Caris showed positive Claudin 18.2, she is a candidate for zolbetuximab.  -Repeated EGD on March 21, 2023 showed residual gastric cancer.  We discussed option of changing her chemotherapy back to FOLFOX and add zolbetuximab.  -PET 06/03/2023 showed stable disease -Patient developed recurrent abdominal pain, similar to the symptoms she had when she was diagnosed.  She agreed to change treatment back to FOLFOX, and add Zolbetuximab. She started on 07/09/2023. She tolerated first cycle poorly and had prolonged recovery. Oxaliplatin  was stop after cycle 1. She continued 5-fu/LV and zolbe every 2 weeks   Assessment & Plan Gastric cancer with chemotherapy-induced nausea and vomiting Experiencing significant gastrointestinal side effects from chemotherapy, including daily nausea and vomiting, triggered by factors such as excessive water intake or brushing teeth. Phenergan  has been somewhat effective but is running low. Current chemotherapy regimen causes moderate nausea and vomiting, expected to improve over time. Willing to continue treatment despite side effects. Slight dose reduction planned to manage side effects. - Refill Phenergan  for nausea management - Slightly reduce chemotherapy dose to manage side effects - Monitor weight regularly - Consult with dietitian for nutritional support and tips to manage weight  Weight loss due to chemotherapy Significant weight loss of 8-9 pounds recently, likely due to  chemotherapy-induced nausea and vomiting. Intake is limited by symptoms. Encouraged to consume high-calorie, high-carbohydrate foods and  supplements to help maintain weight. - Encourage consumption of high-calorie, high-carbohydrate foods and supplements - Monitor weight regularly - Consult with dietitian for nutritional support and tips to manage weight  Hypokalemia - Potassium 3.2 today, will increase her oral potassium, and give her 20meq oral potassium here.  Plan - Lab reviewed, ANC 1.1, adequate for treatment, will proceed cycle 4 treatment today, with 10% dose reduction on both due to GI side effect and neutropenia - She will skip her treatment in 2 weeks due to her grandson's graduation - Follow-up in 4 weeks before next treatment.   SUMMARY OF ONCOLOGIC HISTORY: Oncology History Overview Note   Cancer Staging  Gastric cancer [facility]) Staging form: Stomach, AJCC 8th Edition - Clinical stage from 04/19/2022: Stage IVB (cT2, cN0, pM1) - Signed by Sonja [place], MD on 05/08/2022 Total positive nodes: 0     Gastric cancer (HCC)  03/30/2022 Procedure   EGD:  Impression:  - Normal esophagus. - A few gastric polyps. Biopsied. - Gastritis. Biopsied. - Non-bleeding gastric ulcer with no stigmata of bleeding. Biopsied. - Normal examined duodenum. Biopsied.  Findings: Diffuse moderate inflammation characterized by congestion (edema), friability and granularity was found in the cardia, in the gastric fundus and in the gastric body. There were associated erosions in multiple places. Biopsies were taken from the antrum, body, and fundus with a cold forceps for histology. Estimated blood loss was minimal.  One non-bleeding cratered gastric ulcer with no stigmata of bleeding was found on the greater curvature of the stomach. The lesion was 6 mm in largest dimension. The mucosa around the ulcer was heaped and led to some deformity in the antrum. Biopsies were taken with a cold forceps for histology. Estimated blood loss was minimal.    03/30/2022 Pathology Results   [name]  Accession:  [id number]  Diagnosis 1. Surgical [[name]], duodenal - BENIGN SMALL BOWEL MUCOSA WITH NO SIGNIFICANT PATHOLOGIC CHANGES 2. Surgical [[name]], gastric antrum - GASTRIC ANTRAL MUCOSA WITH FEATURES OF REACTIVE GASTROPATHY - NEGATIVE FOR H. PYLORI ON H&E STAIN - NEGATIVE FOR INTESTINAL METAPLASIA OR MALIGNANCY 3. Surgical [[name]], gastric body - GASTRIC OXYNTIC MUCOSA WITH REACTIVE/REPARATIVE CHANGES - NEGATIVE FOR H. PYLORI ON H&E STAIN - NEGATIVE FOR INTESTINAL METAPLASIA, DYSPLASIA OR MALIGNANCY 4. Surgical [[name]], greater curve ulceration - ADENOCARCINOMA WITH SIGNET RING CELL FEATURES (SEE NOTE) 5. Surgical [[name]], gastric polyps - ADENOCARCINOMA WITH SIGNET RING CELL FEATURES (SEE NOTE) 6. Surgical [[name]], fundus (gastric) - ADENOCARCINOMA WITH SIGNET RING CELL FEATURES (SEE NOTE) 7. Surgical [[name]], colon, ascending, polyp (1) - TUBULAR ADENOMA. - NO HIGH GRADE DYSPLASIA OR MALIGNANCY. 8. Surgical [[name]], colon, transverse, polyp (1) - TUBULAR ADENOMA. - NO HIGH GRADE DYSPLASIA OR MALIGNANCY.    04/13/2022 Initial Diagnosis   Gastric cancer (HCC)   04/19/2022 Cancer Staging   Staging form: Stomach, AJCC 8th Edition - Clinical stage from 04/19/2022: Stage IVB (cT2, cN0, pM1) - Signed by Sonja [place], MD on 05/08/2022 Total positive nodes: 0   05/05/2022 Genetic Testing   Negative genetic testing on the Multi-cancer gene panel + RNA.  FH c.259C>T VUS identified.  The report date is May 05, 2022.  The Multi-Cancer + RNA Panel offered by [facility] includes sequencing and/or deletion/duplication analysis of the following 70 genes:  AIP*, ALK, APC*, ATM*, AXIN2*, BAP1*, BARD1*, BLM*, BMPR1A*, BRCA1*, BRCA2*, BRIP1*, CDC73*, CDH1*, CDK4, CDKN1B*, CDKN2A, CHEK2*, CTNNA1*, DICER1*, EPCAM (del/dup only),  EGFR, FH*, FLCN*, GREM1 (promoter dup only), HOXB13, KIT, LZTR1, MAX*, MBD4, MEN1*, MET, MITF, MLH1*, MSH2*, MSH3*, MSH6*, MUTYH*, NF1*, NF2*, NTHL1*, PALB2*, PDGFRA, PMS2*, POLD1*, POLE*, POT1*, PRKAR1A*, PTCH1*,  PTEN*, RAD51C*, RAD51D*, RB1*, RET, SDHA* (sequencing only), SDHAF2*, SDHB*, SDHC*, SDHD*, SMAD4*, SMARCA4*, SMARCB1*, SMARCE1*, STK11*, SUFU*, TMEM127*, TP53*, TSC1*, TSC2*, VHL*. RNA analysis is performed for * genes.    05/09/2022 - 06/07/2022 Chemotherapy   Patient is on Treatment Plan : GASTROESOPHAGEAL FLOT q14d X 4 cycles      Miscellaneous   Foundation One  Biomarker Findings Microsatellite status- Cannot be determined Tumor Mutational Burden- Cannot be determined  Genomic Findings  FGFR2 amplification,FGFR2-TACC2 fusion,  Rearrangement intron 17 ARAF amplification CCND3 amplification TP53 [id number]*74     05/30/2022 Imaging    IMPRESSION: 1. Mild hypermetabolism corresponding to a dominant left upper quadrant mass and smaller perigastric nodules or nodes. 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 Recommend attention to the dominant left upper quadrant soft tissue mass on follow-up exams to exclude unlikely recurrent lymphoma. 2. No gastric hypermetabolism and no typical findings of metastatic disease.   06/20/2022 - 11/16/2022 Chemotherapy   Patient is on Treatment Plan : GASTRIC FOLFOX q14d x 12 cycles     08/27/2022 Imaging    IMPRESSION: No focal gastric mass on CT.   No findings suspicious for recurrent or metastatic disease.   Stable left upper abdominal soft tissue lesion and small lymph nodes, chronic, favoring treated lymphoma.   11/27/2022 Imaging    IMPRESSION: 1. Questionable thickening of the distal esophagus/GE junction and gastric antrum, consider further evaluation with endoscopy. 2. Chronically stable left upper quadrant nodularity and prominent lymph nodes again favored treated lymphoma. Continued attention on follow-up imaging suggested. 3. No convincing evidence of metastatic disease in the chest, abdomen or pelvis. 4. Questionable asymmetric wall thickening of the rectum, consider further evaluation with  colonoscopy. 5. Mild wall thickening of a nondistended urinary bladder, correlate with urinalysis to exclude cystitis. 6. Hepatic steatosis.   07/10/2023 -  Chemotherapy   Patient is on Treatment Plan : GASTROESOPHAGEAL Zolbetuximab (800/400) + FOLFOX D1,15,29 q42d x 4 cycles / Zolbetuximab (400) + 5FU + Leucovorin  D1,15,29 q42d        Discussed the use of AI scribe software for clinical note transcription with the patient, who gave verbal consent to proceed.  History of Present Illness Sheryl [name] is a 58 year old female with gastric cancer who presents for follow-up. She is accompanied by her dad.  She experiences persistent nausea and vomiting, occurring daily and significantly affecting her quality of life. Vomiting is often triggered by consuming liquids or brushing her teeth. She vomited two days ago after drinking water and again this morning after having tea and brushing her teeth. She manages symptoms by eating small amounts of tolerable foods like apple juice and chicken noodle soup but struggles to maintain her weight, having lost seven pounds recently.  She uses Phenergan  for nausea, taking it two to three times a day, which is helpful, though she is running low on this medication. She tolerates certain foods like tacos and smoothies in small amounts but has lost her appetite for others, such as fish fillet. No constant stomach pain, muscle pain, fever, or chills, but she often feels cold. Bowel movements are regular.  Her energy level has decreased to about seventy percent of her normal, but she maintains some daily activities, such as cooking occasionally and helping her grandson's  mother.     All other systems were reviewed with the patient and are negative.  MEDICAL HISTORY:  Past Medical History:  Diagnosis Date   Blood transfusion without reported diagnosis    had transfusion with hysterectomy   Cataract    Colon polyps 2012   Diabetes (HCC) 03/13/2021    Diabetes (HCC) 05/21/2019   Family history of breast cancer    Family history of pancreatic cancer    Family history of stomach cancer    Fibroid    gastric ca 03/2022   GERD (gastroesophageal reflux disease)    H/O blood clots    History of hysterectomy    fibroids and heavy cycles   Hypertension     SURGICAL HISTORY: Past Surgical History:  Procedure Laterality Date   ABDOMINAL HYSTERECTOMY     BIOPSY  04/19/2022   Procedure: BIOPSY;  Surgeon: Normie Becton., MD;  Location: [facility];  Service: Gastroenterology;;   BIOPSY  03/21/2023   Procedure: BIOPSY;  Surgeon: Normie Becton., MD;  Location: [facility];  Service: Gastroenterology;;   COLONOSCOPY     ESOPHAGOGASTRODUODENOSCOPY (EGD) WITH PROPOFOL  N/A 04/19/2022   Procedure: ESOPHAGOGASTRODUODENOSCOPY (EGD) WITH PROPOFOL ;  Surgeon: Normie Becton., MD;  Location: [facility];  Service: Gastroenterology;  Laterality: N/A;   ESOPHAGOGASTRODUODENOSCOPY (EGD) WITH PROPOFOL  N/A 03/21/2023   Procedure: ESOPHAGOGASTRODUODENOSCOPY (EGD) WITH PROPOFOL ;  Surgeon: Brice Campi Albino Alu., MD;  Location: [facility];  Service: Gastroenterology;  Laterality: N/A;   EUS N/A 04/19/2022   Procedure: UPPER ENDOSCOPIC ULTRASOUND (EUS) RADIAL;  Surgeon: Normie Becton., MD;  Location: [facility];  Service: Gastroenterology;  Laterality: N/A;   EXCISION OF SKIN TAG  05/03/2022   Procedure: EXCISION OF CHEST WALL SKIN LESION;  Surgeon: Lujean Sake, MD;  Location: [facility];  Service: General;;   LAPAROSCOPY N/A 05/03/2022   Procedure: LAPAROSCOPY DIAGNOSTIC WITH PERITONEAL WASHINGS;  Surgeon: Lujean Sake, MD;  Location: [facility];  Service: General;  Laterality: N/A;   POLYPECTOMY  04/19/2022   Procedure: POLYPECTOMY;  Surgeon: Normie Becton., MD;  Location: [facility];  Service: Gastroenterology;;   PORTACATH PLACEMENT N/A 05/03/2022   Procedure: INSERTION PORT-A-CATH WITH ULTRASOUND GUIDANCE;   Surgeon: Lujean Sake, MD;  Location: [facility];  Service: General;  Laterality: N/A;   UPPER GASTROINTESTINAL ENDOSCOPY      I have reviewed the social history and family history with the patient and they are unchanged from previous note.  ALLERGIES:  is allergic to aspirin, cyclobenzaprine, naproxen sodium, zithromax [azithromycin dihydrate], oxaliplatin , and dilaudid [hydromorphone].  MEDICATIONS:  Current Outpatient Medications  Medication Sig Dispense Refill   acetaminophen  (TYLENOL ) 500 MG tablet Take 2 tablets (1,000 mg total) by mouth every 8 (eight) hours as needed (pain). 30 tablet 1   amLODipine  (NORVASC ) 10 MG tablet Take 1 tablet (10 mg total) by mouth daily. 30 tablet 2   b complex vitamins capsule Take 1 capsule by mouth daily.     Cyanocobalamin (VITAMIN B 12 PO) Take by mouth.     dexamethasone  (DECADRON ) 4 MG tablet Take 2 tabs daily for 2-3 days then 1 tab daily for additional 2-4 days, total no more than 7 days 10 tablet 0   famotidine  (PEPCID ) 20 MG tablet Take 1 tablet (20 mg total) by mouth 2 (two) times daily. 60 tablet 2   HYDROcodone -acetaminophen  (NORCO/VICODIN) 5-325 MG tablet Take 1 tablet by mouth every 6 (six) hours as needed for moderate pain (pain score 4-6). 20 tablet 0  lidocaine -prilocaine  (EMLA ) cream Apply 1 Application topically as needed. 30 g 1   ondansetron  (ZOFRAN ) 8 MG tablet Take 1 tablet (8 mg total) by mouth every 8 (eight) hours as needed for nausea or vomiting. 30 tablet 2   potassium chloride  SA (KLOR-CON  M) 20 MEQ tablet Take 2 tablets (40 mEq total) by mouth 2 (two) times daily for 5 days, THEN 1 tablet (20 mEq total) 2 (two) times daily for 25 days. 80 tablet 0   POTASSIUM PO Take 99 mg by mouth daily.     prochlorperazine  (COMPAZINE ) 10 MG tablet Take 1 tablet (10 mg total) by mouth every 6 (six) hours as needed for nausea or vomiting. 30 tablet 2   promethazine  (PHENERGAN ) 25 MG tablet Take 1 tablet (25 mg total) by mouth every 6 (six)  hours as needed for nausea or vomiting. 60 tablet 1   scopolamine  (TRANSDERM-SCOP) 1 MG/3DAYS Place 1 patch (1.5 mg total) onto the skin every 3 (three) days. 10 patch 1   sucralfate  (CARAFATE ) 1 g tablet Take 1 tablet (1 g total) by mouth 2 (two) times daily. 60 tablet 6   No current facility-administered medications for this visit.   Facility-Administered Medications Ordered in Other Visits  Medication Dose Route Frequency Provider Last Rate Last Admin   0.9 %  sodium chloride  infusion   Intravenous Continuous Sonja Deale, MD 10 mL/hr at 09/04/23 0854 New Bag at 09/04/23 0854   fluorouracil  (ADRUCIL ) 4,450 mg in sodium chloride  0.9 % 61 mL chemo infusion  2,100 mg/m2 (Treatment Plan Recorded) Intravenous 1 day or 1 dose Sonja Brewer, MD       leucovorin  844 mg in sodium chloride  0.9 % 250 mL infusion  400 mg/m2 (Treatment Plan Recorded) Intravenous Once Sonja [place], MD       LORazepam  (ATIVAN ) injection 0.5 mg  0.5 mg Intravenous Once PRN Sonja Nellie, MD       prochlorperazine  (COMPAZINE ) tablet 5 mg  5 mg Oral Q6H PRN Sonja Sterling, MD   5 mg at 09/04/23 1610   sodium chloride  flush (NS) 0.9 % injection 10 mL  10 mL Intracatheter PRN Sonja Knapp, MD       zolbetuximab-clzb (VYLOY) 700 mg in sodium chloride  0.9 % 105 mL (5 mg/mL) infusion  700 mg Intravenous Once Sonja Leavenworth, MD        PHYSICAL EXAMINATION: ECOG PERFORMANCE STATUS: 2 - Symptomatic, <50% confined to bed  There were no vitals filed for this visit. Wt Readings from Last 3 Encounters:  09/04/23 183 lb 8 oz (83.2 kg)  08/21/23 190 lb 7.6 oz (86.4 kg)  08/16/23 198 lb 1.6 oz (89.9 kg)     GENERAL:alert, no distress and comfortable SKIN: skin color, texture, turgor are normal, no rashes or significant lesions EYES: normal, Conjunctiva are pink and non-injected, sclera clear NECK: supple, thyroid  normal size, non-tender, without nodularity LYMPH:  no palpable lymphadenopathy in the cervical, axillary  LUNGS: clear to auscultation and  percussion with normal breathing effort HEART: regular rate & rhythm and no murmurs and no lower extremity edema ABDOMEN:abdomen soft, non-tender and normal bowel sounds Musculoskeletal:no cyanosis of digits and no clubbing  NEURO: alert & oriented x 3 with fluent speech, no focal motor/sensory deficits  Physical Exam    LABORATORY DATA:  I have reviewed the data as listed    Latest Ref Rng & Units 09/04/2023    7:46 AM 08/21/2023    7:48 AM 08/16/2023    9:21 AM  CBC  WBC 4.0 - 10.5 K/uL 2.8  3.0  4.2   Hemoglobin 12.0 - 15.0 g/dL 91.4  78.2  95.6   Hematocrit 36.0 - 46.0 % 36.7  36.5  37.5   Platelets 150 - 400 K/uL 291  252  294         Latest Ref Rng & Units 09/04/2023    7:46 AM 08/21/2023    7:48 AM 08/16/2023    9:21 AM  CMP  Glucose 70 - 99 mg/dL 213  086  578   BUN 6 - 20 mg/dL 8  6  9    Creatinine 0.44 - 1.00 mg/dL 4.69  6.29  5.28   Sodium 135 - 145 mmol/L 137  137  137   Potassium 3.5 - 5.1 mmol/L 3.2  3.6  3.1   Chloride 98 - 111 mmol/L 102  104  101   CO2 22 - 32 mmol/L 28  26  29    Calcium  8.9 - 10.3 mg/dL 8.9  8.5  8.6   Total Protein 6.5 - 8.1 g/dL 6.2  6.0  5.9   Total Bilirubin 0.0 - 1.2 mg/dL 0.5  0.8  0.5   Alkaline Phos 38 - 126 U/L 88  91  87   AST 15 - 41 U/L 18  20  21    ALT 0 - 44 U/L 12  13  18        RADIOGRAPHIC STUDIES: I have personally reviewed the radiological images as listed and agreed with the findings in the report. No results found.    Orders Placed This Encounter  Procedures   CBC with Differential (Cancer Center Only)    Standing Status:   Future    Expected Date:   11/27/2023    Expiration Date:   11/26/2024   CMP (Cancer Center only)    Standing Status:   Future    Expected Date:   11/27/2023    Expiration Date:   11/26/2024   CBC with Differential (Cancer Center Only)    Standing Status:   Future    Expected Date:   12/11/2023    Expiration Date:   12/10/2024   CMP (Cancer Center only)    Standing Status:   Future     Expected Date:   12/11/2023    Expiration Date:   12/10/2024   CBC with Differential (Cancer Center Only)    Standing Status:   Future    Expected Date:   12/25/2023    Expiration Date:   12/24/2024   CMP (Cancer Center only)    Standing Status:   Future    Expected Date:   12/25/2023    Expiration Date:   12/24/2024   All questions were answered. The patient knows to call the clinic with any problems, questions or concerns. No barriers to learning was detected. The total time spent in the appointment was 25 minutes.     Sonja Sutherlin, MD 09/04/2023

## 2023-09-05 ENCOUNTER — Encounter

## 2023-09-05 ENCOUNTER — Other Ambulatory Visit: Payer: Self-pay | Admitting: Nurse Practitioner

## 2023-09-06 ENCOUNTER — Inpatient Hospital Stay

## 2023-09-06 ENCOUNTER — Encounter

## 2023-09-06 ENCOUNTER — Other Ambulatory Visit: Payer: Self-pay

## 2023-09-06 VITALS — BP 109/78 | HR 71 | Temp 98.0°F | Resp 14

## 2023-09-06 DIAGNOSIS — Z95828 Presence of other vascular implants and grafts: Secondary | ICD-10-CM

## 2023-09-06 DIAGNOSIS — Z5112 Encounter for antineoplastic immunotherapy: Secondary | ICD-10-CM | POA: Diagnosis not present

## 2023-09-06 MED ORDER — FAMOTIDINE 20 MG PO TABS
20.0000 mg | ORAL_TABLET | Freq: Two times a day (BID) | ORAL | 2 refills | Status: DC
  Filled 2023-09-06: qty 60, 30d supply, fill #0
  Filled 2023-10-08: qty 60, 30d supply, fill #1
  Filled 2023-11-04: qty 60, 30d supply, fill #2

## 2023-09-06 MED ORDER — SODIUM CHLORIDE 0.9 % IV SOLN: Freq: Once | INTRAVENOUS | Status: AC

## 2023-09-06 MED ORDER — SODIUM CHLORIDE 0.9 % IV SOLN
25.0000 mg | Freq: Once | INTRAVENOUS | Status: AC
  Administered 2023-09-06: 25 mg via INTRAVENOUS
  Filled 2023-09-06: qty 1

## 2023-09-06 NOTE — Patient Instructions (Signed)

## 2023-09-10 ENCOUNTER — Encounter: Payer: Self-pay | Admitting: Internal Medicine

## 2023-09-10 ENCOUNTER — Ambulatory Visit: Payer: Medicaid Other | Admitting: Internal Medicine

## 2023-09-10 VITALS — BP 138/84 | HR 93 | Temp 98.2°F | Resp 16 | Ht 68.0 in | Wt 180.8 lb

## 2023-09-10 DIAGNOSIS — E118 Type 2 diabetes mellitus with unspecified complications: Secondary | ICD-10-CM | POA: Diagnosis not present

## 2023-09-10 DIAGNOSIS — I10 Essential (primary) hypertension: Secondary | ICD-10-CM | POA: Diagnosis not present

## 2023-09-10 DIAGNOSIS — T782XXA Anaphylactic shock, unspecified, initial encounter: Secondary | ICD-10-CM | POA: Insufficient documentation

## 2023-09-10 LAB — POCT GLYCOSYLATED HEMOGLOBIN (HGB A1C): Hemoglobin A1C: 6.1 % — AB (ref 4.0–5.6)

## 2023-09-10 NOTE — Progress Notes (Unsigned)
 Subjective:  Patient ID: Sheryl [name], female    DOB: 01/21/1966  Age: 58 y.o. MRN: [id number]  CC: Medical Management of Chronic Issues (6 month f/u. No concerns. )   HPI Sheryl [name] presents for f/up ----  Discussed the use of AI scribe software for clinical note transcription with the patient, who gave verbal consent to proceed.  History of Present Illness   Sheryl [name] is a 58 year old female with cancer who presents with weight loss, nausea, and vomiting.  She has experienced significant weight loss, nausea, and vomiting, which she attributes to her cancer. She notes a tremendous amount of weight loss, currently weighing 180 pounds, and mentions losing approximately 17 to 20 pounds since starting a new chemotherapy regimen. She experiences occasional stomach pain but denies any diarrhea or constipation. She is taking medication for nausea and vomiting, prescribed by her oncologist.  No symptoms related to blood sugar issues such as excessive thirst or urination. She also denies any respiratory symptoms like coughing, wheezing, or shortness of breath.       Outpatient Medications Prior to Visit  Medication Sig Dispense Refill   acetaminophen  (TYLENOL ) 500 MG tablet Take 2 tablets (1,000 mg total) by mouth every 8 (eight) hours as needed (pain). 30 tablet 1   amLODipine  (NORVASC ) 10 MG tablet Take 1 tablet (10 mg total) by mouth daily. 30 tablet 2   b complex vitamins capsule Take 1 capsule by mouth daily.     famotidine  (PEPCID ) 20 MG tablet Take 1 tablet (20 mg total) by mouth 2 (two) times daily. 60 tablet 2   HYDROcodone -acetaminophen  (NORCO/VICODIN) 5-325 MG tablet Take 1 tablet by mouth every 6 (six) hours as needed for moderate pain (pain score 4-6). 20 tablet 0   lidocaine -prilocaine  (EMLA ) cream Apply 1 Application topically as needed. 30 g 1   ondansetron  (ZOFRAN ) 8 MG tablet Take 1 tablet (8 mg total) by mouth every 8 (eight) hours as needed for nausea or  vomiting. 30 tablet 2   potassium chloride  SA (KLOR-CON  M) 20 MEQ tablet Take 2 tablets (40 mEq total) by mouth 2 (two) times daily for 5 days, THEN 1 tablet (20 mEq total) 2 (two) times daily for 25 days. 80 tablet 0   prochlorperazine  (COMPAZINE ) 10 MG tablet Take 1 tablet (10 mg total) by mouth every 6 (six) hours as needed for nausea or vomiting. 30 tablet 2   promethazine  (PHENERGAN ) 25 MG tablet Take 1 tablet (25 mg total) by mouth every 6 (six) hours as needed for nausea or vomiting. 60 tablet 1   scopolamine  (TRANSDERM-SCOP) 1 MG/3DAYS Place 1 patch (1.5 mg total) onto the skin every 3 (three) days. 10 patch 1   sucralfate  (CARAFATE ) 1 g tablet Take 1 tablet (1 g total) by mouth 2 (two) times daily. 60 tablet 6   Cyanocobalamin (VITAMIN B 12 PO) Take by mouth.     dexamethasone  (DECADRON ) 4 MG tablet Take 2 tabs daily for 2-3 days then 1 tab daily for additional 2-4 days, total no more than 7 days 10 tablet 0   POTASSIUM PO Take 99 mg by mouth daily.     No facility-administered medications prior to visit.    ROS Review of Systems  Objective:  BP 138/84 (BP Location: Left Arm, Patient Position: Sitting, Cuff Size: Normal)   Pulse 93   Temp 98.2 F (36.8 C) (Oral)   Resp 16   Ht 5\' 8"  (1.727 m)   Wt  180 lb 12.8 oz (82 kg)   LMP 06/29/2010   SpO2 97%   BMI 27.49 kg/m   BP Readings from Last 3 Encounters:  09/10/23 138/84  09/06/23 109/78  09/04/23 126/80    Wt Readings from Last 3 Encounters:  09/10/23 180 lb 12.8 oz (82 kg)  09/04/23 183 lb 8 oz (83.2 kg)  08/21/23 190 lb 7.6 oz (86.4 kg)    Physical Exam Abdominal:     General: Abdomen is flat.     Palpations: There is no hepatomegaly, splenomegaly or mass.     Tenderness: There is abdominal tenderness in the epigastric area. There is no guarding.     Lab Results  Component Value Date   WBC 2.8 (L) 09/04/2023   HGB 13.4 09/04/2023   HCT 36.7 09/04/2023   PLT 291 09/04/2023   GLUCOSE 133 (H) 09/04/2023    CHOL 179 12/20/2020   TRIG 90 12/20/2020   HDL 45 12/20/2020   LDLCALC 117 (H) 12/20/2020   ALT 12 09/04/2023   AST 18 09/04/2023   NA 137 09/04/2023   K 3.2 (L) 09/04/2023   CL 102 09/04/2023   CREATININE 0.72 09/04/2023   BUN 8 09/04/2023   CO2 28 09/04/2023   TSH 1.09 03/12/2023   INR 1.01 09/18/2010   HGBA1C 5.6 03/12/2023   MICROALBUR <0.7 03/12/2023    No results found.  Assessment & Plan:  Type II diabetes mellitus with manifestations (HCC) -     Ambulatory referral to Ophthalmology  Primary hypertension     Follow-up: Return in about 6 months (around 03/12/2024).  Sandra Crouch, MD

## 2023-09-10 NOTE — Patient Instructions (Signed)

## 2023-09-11 ENCOUNTER — Other Ambulatory Visit

## 2023-09-11 ENCOUNTER — Other Ambulatory Visit: Payer: Self-pay

## 2023-09-11 ENCOUNTER — Ambulatory Visit

## 2023-09-11 ENCOUNTER — Ambulatory Visit: Admitting: Hematology

## 2023-09-12 ENCOUNTER — Other Ambulatory Visit: Payer: Self-pay

## 2023-09-12 ENCOUNTER — Telehealth: Payer: Self-pay

## 2023-09-12 NOTE — Telephone Encounter (Signed)
 Pt called stating she's experiencing episodes of dizziness.  Pt stated she saw her PCP on 09/11/2023 and her Hbg A1c is 6.1 and her BP was 160/81.  Pt stated she's lost weight and now 180lbs.  Pt stated she had a single episode of vomitting last night around 2300 and doesn't know why because she was not nauseous.  Pt stated she ate fish for dinner around 1730 and didn't eat nor drink anything afterwards.  Pt stated she's drinking maybe 1 to 2 bottles of water per day.  Pt stated it's more on the 1 bottle per day because she's taking sips of water throughout the day.  Stated that the pt should be drinking at least 3 to 6 bottles of water per day.  Encouraged pt to drink Liquid IV, Gatorade, or Pedialyte to rehydrate if the take of water is too metallic.  Pt verbalized understanding.  Stated this nurse can see if the pt can be seen in [facility] or [facility] for IVFs tomorrow but pt refused and stated she would try to drink more fluids.  Instructed pt to contact Dr. Candise Chambers office tomorrow if symptoms does not improve and definitely contact Dr. Candise Chambers office on Monday morning is symptoms are still present.  Pt verbalized understanding and agreed with plan.  Notified Dr. Maryalice Smaller and Team along with [facility].

## 2023-09-13 ENCOUNTER — Encounter

## 2023-09-16 ENCOUNTER — Other Ambulatory Visit: Payer: Self-pay | Admitting: Hematology

## 2023-09-17 ENCOUNTER — Other Ambulatory Visit

## 2023-09-17 ENCOUNTER — Ambulatory Visit: Admitting: Hematology

## 2023-09-17 ENCOUNTER — Ambulatory Visit

## 2023-09-17 ENCOUNTER — Other Ambulatory Visit: Payer: Self-pay

## 2023-09-17 MED ORDER — PROCHLORPERAZINE MALEATE 10 MG PO TABS
10.0000 mg | ORAL_TABLET | Freq: Four times a day (QID) | ORAL | 2 refills | Status: AC | PRN
Start: 2023-09-17 — End: ?
  Filled 2023-09-17: qty 30, 8d supply, fill #0
  Filled 2023-09-30: qty 30, 8d supply, fill #1
  Filled 2023-10-13: qty 30, 8d supply, fill #2

## 2023-09-17 MED ORDER — POTASSIUM CHLORIDE CRYS ER 20 MEQ PO TBCR
EXTENDED_RELEASE_TABLET | ORAL | 0 refills | Status: AC
Start: 2023-09-17 — End: 2023-10-17
  Filled 2023-09-17: qty 80, 30d supply, fill #0

## 2023-09-18 ENCOUNTER — Ambulatory Visit: Admitting: Hematology

## 2023-09-18 ENCOUNTER — Ambulatory Visit

## 2023-09-18 ENCOUNTER — Other Ambulatory Visit

## 2023-09-19 ENCOUNTER — Encounter

## 2023-09-20 ENCOUNTER — Encounter

## 2023-09-20 ENCOUNTER — Ambulatory Visit

## 2023-09-25 ENCOUNTER — Other Ambulatory Visit: Payer: Self-pay

## 2023-09-26 ENCOUNTER — Other Ambulatory Visit: Payer: Self-pay

## 2023-09-30 ENCOUNTER — Other Ambulatory Visit: Payer: Self-pay

## 2023-10-01 MED FILL — Fosaprepitant Dimeglumine For IV Infusion 150 MG (Base Eq): INTRAVENOUS | Qty: 5 | Status: AC

## 2023-10-01 NOTE — Assessment & Plan Note (Signed)
 [id number] with peritoneal metastasis. 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   -she started first line chemo FLOT on 1/10 -She understands that chemotherapy is palliative, to prolong her life.  We are unlikely going to cure her cancer. -PD-L1 0-1%, no significant benefit from PD-L1 immunotherapy, FO revealed FGFR2 amplification and fusion (+), FGFR inhibitors can be considered in future, no other targeted therapy available  -She has been tolerating chemo very well, will continue for now  -PET scan from 05/30/2022 was negative for primary tumor or metastatic disease, the known peritoneal mets did not show on PET.  -she has been tolerating chemo well overall. Due to fatigue, I have changed her chemo from FLOT to FOLFOX on 06/20/2022, she tolerated well -she previously asked the role of surgery, depends on her next restaging CT scan findings, I may refer her to [facility] or [facility] to discuss HIPEC surgery  -due to her infusion reaction to oxaliplatin  on C5, we added additional premeds and gave slow infusion over 4 hours for cycle 6 and she tolerated well  -She is not able to return to work due to the cancer and treatment related symptoms.  -She is tolerating FOLFOX well overall, with moderate fatigue for a few days after infusion but able to recover well.  No signs of neuropathy at this point. -Restaging CT abdomen pelvis from August 27, 2022 showed no residual disease.  -We again discussed maintenance therapy with Xeloda  down the road, we will stop oxaliplatin  when she develops side effects especially neuropathy, or after next scan -repeated staging CT from 11/26/2021 showed stable disease  -I have changed her treatment to maintenance Xeloda  in early August 2024, she is tolerating well overall  -her NGS Caris showed positive Claudin 18.2, she is a  candidate for zolbetuximab.  -Repeated EGD on March 21, 2023 showed residual gastric cancer.  We discussed option of changing her chemotherapy back to FOLFOX and add zolbetuximab.  -PET 06/03/2023 showed stable disease -Patient developed recurrent abdominal pain, similar to the symptoms she had when she was diagnosed.  She agreed to change treatment back to FOLFOX, and add Zolbetuximab. She started on 07/09/2023. She tolerated first cycle poorly and had prolonged recovery. Oxaliplatin  was stop after cycle 1. She continued 5-fu/LV and zolbe every 2 weeks

## 2023-10-02 ENCOUNTER — Ambulatory Visit: Admitting: Dietician

## 2023-10-02 ENCOUNTER — Other Ambulatory Visit: Payer: Self-pay

## 2023-10-02 ENCOUNTER — Inpatient Hospital Stay: Attending: Physician Assistant | Admitting: Hematology

## 2023-10-02 ENCOUNTER — Inpatient Hospital Stay

## 2023-10-02 ENCOUNTER — Ambulatory Visit: Admitting: Hematology

## 2023-10-02 ENCOUNTER — Ambulatory Visit

## 2023-10-02 ENCOUNTER — Other Ambulatory Visit

## 2023-10-02 ENCOUNTER — Encounter: Payer: Self-pay | Admitting: Hematology

## 2023-10-02 VITALS — BP 122/84 | HR 103 | Temp 97.0°F | Resp 18 | Ht 68.0 in | Wt 183.4 lb

## 2023-10-02 VITALS — BP 128/92 | HR 112 | Resp 16

## 2023-10-02 DIAGNOSIS — C162 Malignant neoplasm of body of stomach: Secondary | ICD-10-CM

## 2023-10-02 DIAGNOSIS — C169 Malignant neoplasm of stomach, unspecified: Secondary | ICD-10-CM | POA: Insufficient documentation

## 2023-10-02 DIAGNOSIS — Z79899 Other long term (current) drug therapy: Secondary | ICD-10-CM | POA: Insufficient documentation

## 2023-10-02 DIAGNOSIS — Z5111 Encounter for antineoplastic chemotherapy: Secondary | ICD-10-CM | POA: Diagnosis present

## 2023-10-02 DIAGNOSIS — C786 Secondary malignant neoplasm of retroperitoneum and peritoneum: Secondary | ICD-10-CM | POA: Diagnosis not present

## 2023-10-02 DIAGNOSIS — Z5112 Encounter for antineoplastic immunotherapy: Secondary | ICD-10-CM | POA: Diagnosis present

## 2023-10-02 DIAGNOSIS — Z95828 Presence of other vascular implants and grafts: Secondary | ICD-10-CM

## 2023-10-02 LAB — CBC WITH DIFFERENTIAL (CANCER CENTER ONLY)
Abs Immature Granulocytes: 0.03 10*3/uL (ref 0.00–0.07)
Basophils Absolute: 0.1 10*3/uL (ref 0.0–0.1)
Basophils Relative: 1 %
Eosinophils Absolute: 0.2 10*3/uL (ref 0.0–0.5)
Eosinophils Relative: 3 %
HCT: 39 % (ref 36.0–46.0)
Hemoglobin: 13.6 g/dL (ref 12.0–15.0)
Immature Granulocytes: 0 %
Lymphocytes Relative: 15 %
Lymphs Abs: 1.1 10*3/uL (ref 0.7–4.0)
MCH: 29.7 pg (ref 26.0–34.0)
MCHC: 34.9 g/dL (ref 30.0–36.0)
MCV: 85.2 fL (ref 80.0–100.0)
Monocytes Absolute: 0.6 10*3/uL (ref 0.1–1.0)
Monocytes Relative: 8 %
Neutro Abs: 5.4 10*3/uL (ref 1.7–7.7)
Neutrophils Relative %: 73 %
Platelet Count: 325 10*3/uL (ref 150–400)
RBC: 4.58 MIL/uL (ref 3.87–5.11)
RDW: 13.7 % (ref 11.5–15.5)
WBC Count: 7.3 10*3/uL (ref 4.0–10.5)
nRBC: 0 % (ref 0.0–0.2)

## 2023-10-02 LAB — CMP (CANCER CENTER ONLY)
ALT: 15 U/L (ref 0–44)
AST: 25 U/L (ref 15–41)
Albumin: 3.9 g/dL (ref 3.5–5.0)
Alkaline Phosphatase: 94 U/L (ref 38–126)
Anion gap: 7 (ref 5–15)
BUN: 8 mg/dL (ref 6–20)
CO2: 28 mmol/L (ref 22–32)
Calcium: 8.9 mg/dL (ref 8.9–10.3)
Chloride: 103 mmol/L (ref 98–111)
Creatinine: 0.54 mg/dL (ref 0.44–1.00)
GFR, Estimated: >60 mL/min (ref >60)
Glucose, Bld: 152 mg/dL — ABNORMAL HIGH (ref 70–99)
Potassium: 3.3 mmol/L — ABNORMAL LOW (ref 3.5–5.1)
Sodium: 138 mmol/L (ref 135–145)
Total Bilirubin: 0.5 mg/dL (ref 0.0–1.2)
Total Protein: 6.6 g/dL (ref 6.5–8.1)

## 2023-10-02 MED ORDER — SODIUM CHLORIDE 0.9 % IV SOLN: INTRAVENOUS | Status: DC

## 2023-10-02 MED ORDER — PROCHLORPERAZINE MALEATE 10 MG PO TABS
5.0000 mg | ORAL_TABLET | Freq: Four times a day (QID) | ORAL | Status: DC | PRN
  Administered 2023-10-02: 5 mg via ORAL
  Filled 2023-10-02: qty 1

## 2023-10-02 MED ORDER — ZOLBETUXIMAB-CLZB CHEMO 100MG/5ML IV SOLN
700.0000 mg | Freq: Once | INTRAVENOUS | Status: AC
  Administered 2023-10-02: 700 mg via INTRAVENOUS
  Filled 2023-10-02: qty 30

## 2023-10-02 MED ORDER — SODIUM CHLORIDE 0.9 % IV SOLN
2100.0000 mg/m2 | INTRAVENOUS | Status: DC
  Administered 2023-10-02: 4450 mg via INTRAVENOUS
  Filled 2023-10-02: qty 89

## 2023-10-02 MED ORDER — PALONOSETRON HCL INJECTION 0.25 MG/5ML
0.2500 mg | Freq: Once | INTRAVENOUS | Status: AC
  Administered 2023-10-02: 0.25 mg via INTRAVENOUS
  Filled 2023-10-02: qty 5

## 2023-10-02 MED ORDER — DIPHENHYDRAMINE HCL 25 MG PO CAPS
25.0000 mg | ORAL_CAPSULE | Freq: Once | ORAL | Status: AC
  Administered 2023-10-02: 25 mg via ORAL
  Filled 2023-10-02: qty 1

## 2023-10-02 MED ORDER — SODIUM CHLORIDE 0.9% FLUSH: 10.0000 mL | INTRAVENOUS | Status: DC | PRN

## 2023-10-02 MED ORDER — OLANZAPINE 5 MG PO TABS
5.0000 mg | ORAL_TABLET | Freq: Once | ORAL | Status: AC
  Administered 2023-10-02: 5 mg via ORAL
  Filled 2023-10-02: qty 1

## 2023-10-02 MED ORDER — LORAZEPAM 2 MG/ML IJ SOLN
0.5000 mg | Freq: Once | INTRAMUSCULAR | Status: AC | PRN
  Administered 2023-10-02: 0.5 mg via INTRAVENOUS
  Filled 2023-10-02: qty 1

## 2023-10-02 MED ORDER — SODIUM CHLORIDE 0.9% FLUSH
10.0000 mL | Freq: Once | INTRAVENOUS | Status: AC
  Administered 2023-10-02: 10 mL

## 2023-10-02 MED ORDER — SODIUM CHLORIDE 0.9 % IV SOLN
150.0000 mg | Freq: Once | INTRAVENOUS | Status: AC
  Administered 2023-10-02: 150 mg via INTRAVENOUS
  Filled 2023-10-02: qty 150

## 2023-10-02 MED ORDER — FAMOTIDINE IN NACL 20-0.9 MG/50ML-% IV SOLN
20.0000 mg | Freq: Once | INTRAVENOUS | Status: AC
  Administered 2023-10-02: 20 mg via INTRAVENOUS
  Filled 2023-10-02: qty 50

## 2023-10-02 MED ORDER — SODIUM CHLORIDE 0.9 % IV SOLN
400.0000 mg/m2 | Freq: Once | INTRAVENOUS | Status: AC
  Administered 2023-10-02: 844 mg via INTRAVENOUS
  Filled 2023-10-02: qty 42.2

## 2023-10-02 MED ORDER — HEPARIN SOD (PORK) LOCK FLUSH 100 UNIT/ML IV SOLN
500.0000 [IU] | Freq: Once | INTRAVENOUS | Status: DC | PRN
Start: 2023-10-02 — End: 2023-10-02

## 2023-10-02 MED ORDER — DEXAMETHASONE SODIUM PHOSPHATE 10 MG/ML IJ SOLN
10.0000 mg | Freq: Once | INTRAMUSCULAR | Status: AC
  Administered 2023-10-02: 10 mg via INTRAVENOUS
  Filled 2023-10-02: qty 1

## 2023-10-02 NOTE — Patient Instructions (Signed)
 [facility] - A DEPT OF [facility]  Discharge Instructions: Thank you for choosing [facility] to provide your oncology and hematology care.   If you have a lab appointment with the Cancer Center, please go directly to the Cancer Center and check in at the registration area.   Wear comfortable clothing and clothing appropriate for easy access to any Portacath or PICC line.   We strive to give you quality time with your provider. You may need to reschedule your appointment if you arrive late (15 or more minutes).  Arriving late affects you and other patients whose appointments are after yours.  Also, if you miss three or more appointments without notifying the office, you may be dismissed from the clinic at the provider's discretion.      For prescription refill requests, have your pharmacy contact our office and allow 72 hours for refills to be completed.    Today you received the following chemotherapy and/or immunotherapy agents: Vyloy/Leucovorin /Fluorouracil       To help prevent nausea and vomiting after your treatment, we encourage you to take your nausea medication as directed.  BELOW ARE SYMPTOMS THAT SHOULD BE REPORTED IMMEDIATELY: *FEVER GREATER THAN 100.4 F (38 C) OR HIGHER *CHILLS OR SWEATING *NAUSEA AND VOMITING THAT IS NOT CONTROLLED WITH YOUR NAUSEA MEDICATION *UNUSUAL SHORTNESS OF BREATH *UNUSUAL BRUISING OR BLEEDING *URINARY PROBLEMS (pain or burning when urinating, or frequent urination) *BOWEL PROBLEMS (unusual diarrhea, constipation, pain near the anus) TENDERNESS IN MOUTH AND THROAT WITH OR WITHOUT PRESENCE OF ULCERS (sore throat, sores in mouth, or a toothache) UNUSUAL RASH, SWELLING OR PAIN  UNUSUAL VAGINAL DISCHARGE OR ITCHING   Items with * indicate a potential emergency and should be followed up as soon as possible or go to the Emergency Department if any problems should occur.  Please show the CHEMOTHERAPY ALERT  CARD or IMMUNOTHERAPY ALERT CARD at check-in to the Emergency Department and triage nurse.  Should you have questions after your visit or need to cancel or reschedule your appointment, please contact [facility] - A DEPT OF [facility]  Dept: [phone]  and follow the prompts.  Office hours are 8:00 a.m. to 4:30 p.m. Monday - Friday. Please note that voicemails left after 4:00 p.m. may not be returned until the following business day.  We are closed weekends and major holidays. You have access to a nurse at all times for urgent questions. Please call the main number to the clinic Dept: [phone] and follow the prompts.   For any non-urgent questions, you may also contact your provider using MyChart. We now offer e-Visits for anyone 61 and older to request care online for non-urgent symptoms. For details visit [email or web address].   Also download the MyChart app! Go to the app store, search "MyChart", open the app, select [place], and log in with your MyChart username and password.  The chemotherapy medication bag should finish at 46 hours, 96 hours, or 7 days. For example, if your pump is scheduled for 46 hours and it was put on at 4:00 p.m., it should finish at 2:00 p.m. the day it is scheduled to come off regardless of your appointment time.     Estimated time to finish at approximately 1:00 PM on 09/06/23.   If the display on your pump reads "Low Volume" and it is beeping, take the batteries out of the pump and come to the cancer  center for it to be taken off.   If the pump alarms go off prior to the pump reading "Low Volume" then call [phone] and someone can assist you.  If the plunger comes out and the chemotherapy medication is leaking out, please use your home chemo spill kit to clean up the spill. Do NOT use paper towels or other household products.  If you have problems or questions regarding your pump, please call either [phone]  (24 hours a day) or the cancer center Monday-Friday 8:00 a.m.- 4:30 p.m. at the clinic number and we will assist you. If you are unable to get assistance, then go to the nearest Emergency Department and ask the staff to contact the IV team for assistance.

## 2023-10-02 NOTE — Progress Notes (Signed)
 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   -she started first line chemo FLOT on 1/10 -She understands that chemotherapy is palliative, to prolong her life.  We are unlikely going to cure her cancer. -PD-L1 0-1%, no significant benefit from PD-L1 immunotherapy, FO revealed FGFR2 amplification and fusion (+), FGFR inhibitors can be considered in future, no other targeted therapy available  -She has been tolerating chemo very well, will continue for now  -PET scan from 05/30/2022 was negative for primary tumor or metastatic disease, the known peritoneal mets did not show on PET.  -she has been tolerating chemo well overall. Due to fatigue, I have changed her chemo from FLOT to FOLFOX on 06/20/2022, she tolerated well -she previously asked the role of surgery, depends on her next restaging CT scan findings, I may refer her to [name] or [facility] to discuss HIPEC surgery  -due to her infusion reaction to oxaliplatin  on C5, we added additional premeds and gave slow infusion over 4 hours for cycle 6 and she tolerated well  -She is not able to return to work due to the cancer and treatment related symptoms.  -She is tolerating FOLFOX well overall, with moderate fatigue for a few days after infusion but able to recover well.  No signs of neuropathy at this point. -Restaging CT abdomen pelvis from August 27, 2022 showed no  residual disease.  -We again discussed maintenance therapy with Xeloda  down the road, we will stop oxaliplatin  when she develops side effects especially neuropathy, or after next scan -repeated staging CT from 11/26/2021 showed stable disease  -I have changed her treatment to maintenance Xeloda  in early August 2024, she is tolerating well overall  -her NGS Caris showed positive Claudin 18.2, she is a candidate for zolbetuximab.  -Repeated EGD on March 21, 2023 showed residual gastric cancer.  We discussed option of changing her chemotherapy back to FOLFOX and add zolbetuximab.  -PET 06/03/2023 showed stable disease -Patient developed recurrent abdominal pain, similar to the symptoms she had when she was diagnosed.  She agreed to change treatment back to FOLFOX, and add Zolbetuximab. She started on 07/09/2023. She tolerated first cycle poorly and had prolonged recovery. Oxaliplatin  was stop after cycle 1. She continued 5-fu/LV and zolbe every 2 weeks   Assessment & Plan Metastatic gastric cancer Metastatic gastric cancer with a challenging disease course to track. Previous PET scans have been limited due to the small tumor size, but negative results are reassuring. She has received four doses of antibody treatment since March 12. Discussed circulating tumor DNA testing (Signatera) as a non-standard but potentially useful tool for monitoring disease progression, given PET scan limitations. Insurance is expected to cover the test, with no out-of-pocket cost. - Continue 5-FU pump infusion and Zolpi antibody treatment. - Repeat PET scan after one or two more treatment cycles to assess disease progression. - Initiate circulating tumor DNA test (Signatera) every six weeks. - Schedule next treatment for June  18 and follow-up appointment with nurse practitioner. - Review scan results on July 1.  Nausea and vomiting due to chemotherapy Persistent nausea and vomiting, occurring two to three times a week,  often at night. Vomiting is sometimes triggered by food intake or medication. She relies on antiemetic medication to manage symptoms, with stronger medications causing drowsiness. Uses Compazine , Phenergan , and scopolamine  patch for nausea management. Avoids Zofran  due to constipation issues. - Continue current antiemetic regimen with Compazine , Phenergan , and scopolamine  patch. - Ensure antiemetic medications are taken regularly, especially before meals and at night. - Avoid using Zofran  due to constipation issues. - Patient declined additional nausea medicine such as olanzapine   Plan - Lab reviewed, adequate for treatment, will proceed chemo today and continue every 2 weeks - Plan to repeat a PET scan in 3 weeks - Because of her cancer is difficult to evaluated on scan, I also recommend a CT DNA Signatera as tumor marker to monitor disease, or start first 1 in 2 weeks then re[peat in 6 weeks     SUMMARY OF ONCOLOGIC HISTORY: Oncology History Overview Note   Cancer Staging  Gastric cancer [facility]) Staging form: Stomach, AJCC 8th Edition - Clinical stage from 04/19/2022: Stage IVB (cT2, cN0, pM1) - Signed by Sheryl [place], MD on 05/08/2022 Total positive nodes: 0     Gastric cancer (HCC)  03/30/2022 Procedure   EGD:  Impression:  - Normal esophagus. - A few gastric polyps. Biopsied. - Gastritis. Biopsied. - Non-bleeding gastric ulcer with no stigmata of bleeding. Biopsied. - Normal examined duodenum. Biopsied.  Findings: Diffuse moderate inflammation characterized by congestion (edema), friability and granularity was found in the cardia, in the gastric fundus and in the gastric body. There were associated erosions in multiple places. Biopsies were taken from the antrum, body, and fundus with a cold forceps for histology. Estimated blood loss was minimal.  One non-bleeding cratered gastric ulcer with no stigmata of bleeding was found on the greater curvature of the stomach. The lesion  was 6 mm in largest dimension. The mucosa around the ulcer was heaped and led to some deformity in the antrum. Biopsies were taken with a cold forceps for histology. Estimated blood loss was minimal.    03/30/2022 Pathology Results   [name], Sheryl [name]  Accession: [id number]  Diagnosis 1. Surgical [[name]], duodenal - BENIGN SMALL BOWEL MUCOSA WITH NO SIGNIFICANT PATHOLOGIC CHANGES 2. Surgical [[name]], gastric antrum - GASTRIC ANTRAL MUCOSA WITH FEATURES OF REACTIVE GASTROPATHY - NEGATIVE FOR H. PYLORI ON H&E STAIN - NEGATIVE FOR INTESTINAL METAPLASIA OR MALIGNANCY 3. Surgical [[name]], gastric body - GASTRIC OXYNTIC MUCOSA WITH REACTIVE/REPARATIVE CHANGES - NEGATIVE FOR H. PYLORI ON H&E STAIN - NEGATIVE FOR INTESTINAL METAPLASIA, DYSPLASIA OR MALIGNANCY 4. Surgical [[name]], greater curve ulceration - ADENOCARCINOMA WITH SIGNET RING CELL FEATURES (SEE NOTE) 5. Surgical [[name]], gastric polyps - ADENOCARCINOMA WITH SIGNET RING CELL FEATURES (SEE NOTE) 6. Surgical [[name]], fundus (gastric) - ADENOCARCINOMA WITH SIGNET RING CELL FEATURES (SEE NOTE) 7. Surgical [[name]], colon, ascending, polyp (1) - TUBULAR ADENOMA. - NO HIGH GRADE DYSPLASIA OR MALIGNANCY. 8. Surgical [[name]], colon, transverse, polyp (1) - TUBULAR ADENOMA. - NO HIGH GRADE DYSPLASIA OR MALIGNANCY.    04/13/2022 Initial Diagnosis   Gastric cancer (HCC)   04/19/2022 Cancer Staging   Staging form: Stomach, AJCC 8th Edition - Clinical stage from 04/19/2022: Stage IVB (cT2, cN0, pM1) - Signed by Sheryl [place], MD on 05/08/2022 Total positive nodes: 0   05/05/2022 Genetic Testing   Negative genetic testing  on the Multi-cancer gene panel + RNA.  FH c.259C>T VUS identified.  The report date is May 05, 2022.  The Multi-Cancer + RNA Panel offered by [facility] includes sequencing and/or deletion/duplication analysis of the following 70 genes:  AIP*, ALK, APC*, ATM*, AXIN2*, BAP1*, BARD1*, BLM*, BMPR1A*, BRCA1*, BRCA2*, BRIP1*, CDC73*, CDH1*, CDK4,  CDKN1B*, CDKN2A, CHEK2*, CTNNA1*, DICER1*, EPCAM (del/dup only), EGFR, FH*, FLCN*, GREM1 (promoter dup only), HOXB13, KIT, LZTR1, MAX*, MBD4, MEN1*, MET, MITF, MLH1*, MSH2*, MSH3*, MSH6*, MUTYH*, NF1*, NF2*, NTHL1*, PALB2*, PDGFRA, PMS2*, POLD1*, POLE*, POT1*, PRKAR1A*, PTCH1*, PTEN*, RAD51C*, RAD51D*, RB1*, RET, SDHA* (sequencing only), SDHAF2*, SDHB*, SDHC*, SDHD*, SMAD4*, SMARCA4*, SMARCB1*, SMARCE1*, STK11*, SUFU*, TMEM127*, TP53*, TSC1*, TSC2*, VHL*. RNA analysis is performed for * genes.    05/09/2022 - 06/07/2022 Chemotherapy   Patient is on Treatment Plan : GASTROESOPHAGEAL FLOT q14d X 4 cycles      Miscellaneous   Foundation One  Biomarker Findings Microsatellite status- Cannot be determined Tumor Mutational Burden- Cannot be determined  Genomic Findings  FGFR2 amplification,FGFR2-TACC2 fusion,  Rearrangement intron 17 ARAF amplification CCND3 amplification TP53 V235fs*74     05/30/2022 Imaging    IMPRESSION: 1. Mild hypermetabolism corresponding to a dominant left upper quadrant mass and smaller perigastric nodules or nodes. 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 Recommend attention to the dominant left upper quadrant soft tissue mass on follow-up exams to exclude unlikely recurrent lymphoma. 2. No gastric hypermetabolism and no typical findings of metastatic disease.   06/20/2022 - 11/16/2022 Chemotherapy   Patient is on Treatment Plan : GASTRIC FOLFOX q14d x 12 cycles     08/27/2022 Imaging    IMPRESSION: No focal gastric mass on CT.   No findings suspicious for recurrent or metastatic disease.   Stable left upper abdominal soft tissue lesion and small lymph nodes, chronic, favoring treated lymphoma.   11/27/2022 Imaging    IMPRESSION: 1. Questionable thickening of the distal esophagus/GE junction and gastric antrum, consider further evaluation with endoscopy. 2. Chronically stable left upper quadrant nodularity and  prominent lymph nodes again favored treated lymphoma. Continued attention on follow-up imaging suggested. 3. No convincing evidence of metastatic disease in the chest, abdomen or pelvis. 4. Questionable asymmetric wall thickening of the rectum, consider further evaluation with colonoscopy. 5. Mild wall thickening of a nondistended urinary bladder, correlate with urinalysis to exclude cystitis. 6. Hepatic steatosis.   07/10/2023 -  Chemotherapy   Patient is on Treatment Plan : GASTROESOPHAGEAL Zolbetuximab (800/400) + FOLFOX D1,15,29 q42d x 4 cycles / Zolbetuximab (400) + 5FU + Leucovorin  D1,15,29 q42d        Discussed the use of AI scribe software for clinical note transcription with the patient, who gave verbal consent to proceed.  History of Present Illness Sheryl [name] is a 58 year old female with metastatic gastric cancer who presents for follow-up.  She experiences persistent vomiting two to three times a week, often nocturnally. Vomiting occurs spontaneously or after eating certain foods like bread. She manages nausea with Compazine , Phenergan , and a scopolamine  patch, taking medication every six hours and adjusting her routine to prevent nocturnal vomiting. Severe vomiting and stomach pain occur when she runs out of medication, relieved by a stronger nausea pill. Bowel movements are normal without diarrhea. Stomach pain is associated with severe vomiting episodes. Appetite is generally okay when nausea is controlled. She avoids Zofran  due to constipation. She has received four doses of antibody infusion treatment, starting March 12, with the last PET  scan in February and a total of four treatments since then.     All other systems were reviewed with the patient and are negative.  MEDICAL HISTORY:  Past Medical History:  Diagnosis Date   Blood transfusion without reported diagnosis    had transfusion with hysterectomy   Cataract    Colon polyps 2012   Diabetes (HCC)  03/13/2021   Diabetes (HCC) 05/21/2019   Family history of breast cancer    Family history of pancreatic cancer    Family history of stomach cancer    Fibroid    gastric ca 03/2022   GERD (gastroesophageal reflux disease)    H/O blood clots    History of hysterectomy    fibroids and heavy cycles   Hypertension     SURGICAL HISTORY: Past Surgical History:  Procedure Laterality Date   ABDOMINAL HYSTERECTOMY     BIOPSY  04/19/2022   Procedure: BIOPSY;  Surgeon: Normie Becton., MD;  Location: [facility];  Service: Gastroenterology;;   BIOPSY  03/21/2023   Procedure: BIOPSY;  Surgeon: Normie Becton., MD;  Location: [facility];  Service: Gastroenterology;;   COLONOSCOPY     ESOPHAGOGASTRODUODENOSCOPY (EGD) WITH PROPOFOL  N/A 04/19/2022   Procedure: ESOPHAGOGASTRODUODENOSCOPY (EGD) WITH PROPOFOL ;  Surgeon: Normie Becton., MD;  Location: [facility];  Service: Gastroenterology;  Laterality: N/A;   ESOPHAGOGASTRODUODENOSCOPY (EGD) WITH PROPOFOL  N/A 03/21/2023   Procedure: ESOPHAGOGASTRODUODENOSCOPY (EGD) WITH PROPOFOL ;  Surgeon: Brice Campi Albino Alu., MD;  Location: [facility];  Service: Gastroenterology;  Laterality: N/A;   EUS N/A 04/19/2022   Procedure: UPPER ENDOSCOPIC ULTRASOUND (EUS) RADIAL;  Surgeon: Normie Becton., MD;  Location: [facility];  Service: Gastroenterology;  Laterality: N/A;   EXCISION OF SKIN TAG  05/03/2022   Procedure: EXCISION OF CHEST WALL SKIN LESION;  Surgeon: Lujean Sake, MD;  Location: [facility];  Service: General;;   LAPAROSCOPY N/A 05/03/2022   Procedure: LAPAROSCOPY DIAGNOSTIC WITH PERITONEAL WASHINGS;  Surgeon: Lujean Sake, MD;  Location: [facility];  Service: General;  Laterality: N/A;   POLYPECTOMY  04/19/2022   Procedure: POLYPECTOMY;  Surgeon: Normie Becton., MD;  Location: [facility];  Service: Gastroenterology;;   PORTACATH PLACEMENT N/A 05/03/2022   Procedure: INSERTION PORT-A-CATH WITH ULTRASOUND  GUIDANCE;  Surgeon: Lujean Sake, MD;  Location: [facility];  Service: General;  Laterality: N/A;   UPPER GASTROINTESTINAL ENDOSCOPY      I have reviewed the social history and family history with the patient and they are unchanged from previous note.  ALLERGIES:  is allergic to aspirin, cyclobenzaprine, naproxen sodium, zithromax [azithromycin dihydrate], oxaliplatin , and dilaudid [hydromorphone].  MEDICATIONS:  Current Outpatient Medications  Medication Sig Dispense Refill   acetaminophen  (TYLENOL ) 500 MG tablet Take 2 tablets (1,000 mg total) by mouth every 8 (eight) hours as needed (pain). 30 tablet 1   amLODipine  (NORVASC ) 10 MG tablet Take 1 tablet (10 mg total) by mouth daily. 30 tablet 2   b complex vitamins capsule Take 1 capsule by mouth daily.     calcium -vitamin D (OSCAL WITH D) 500-5 MG-MCG tablet Take 2 tablets by mouth 2 (two) times daily.     famotidine  (PEPCID ) 20 MG tablet Take 1 tablet (20 mg total) by mouth 2 (two) times daily. 60 tablet 2   HYDROcodone -acetaminophen  (NORCO/VICODIN) 5-325 MG tablet Take 1 tablet by mouth every 6 (six) hours as needed for moderate pain (pain score 4-6). 20 tablet 0   lidocaine -prilocaine  (EMLA ) cream Apply 1 Application topically as needed. 30 g  1   potassium chloride  SA (KLOR-CON  M) 20 MEQ tablet Take 2 tablets (40 mEq total) by mouth 2 (two) times daily for 5 days, THEN 1 tablet (20 mEq total) 2 (two) times daily for 25 days. 80 tablet 0   prochlorperazine  (COMPAZINE ) 10 MG tablet Take 1 tablet (10 mg total) by mouth every 6 (six) hours as needed for nausea or vomiting. 30 tablet 2   promethazine  (PHENERGAN ) 25 MG tablet Take 1 tablet (25 mg total) by mouth every 6 (six) hours as needed for nausea or vomiting. 60 tablet 1   scopolamine  (TRANSDERM-SCOP) 1 MG/3DAYS Place 1 patch (1.5 mg total) onto the skin every 3 (three) days. 10 patch 1   sucralfate  (CARAFATE ) 1 g tablet Take 1 tablet (1 g total) by mouth 2 (two) times daily. 60 tablet 6    zinc gluconate 50 MG tablet Take 50 mg by mouth daily.     No current facility-administered medications for this visit.   Facility-Administered Medications Ordered in Other Visits  Medication Dose Route Frequency Provider Last Rate Last Admin   0.9 %  sodium chloride  infusion   Intravenous Continuous Sheryl [place], MD 10 mL/hr at 10/02/23 0946 New Bag at 10/02/23 0946   fluorouracil  (ADRUCIL ) 4,450 mg in sodium chloride  0.9 % 61 mL chemo infusion  2,100 mg/m2 (Treatment Plan Recorded) Intravenous 1 day or 1 dose Sheryl [place], MD       heparin  lock flush 100 unit/mL  500 Units Intracatheter Once PRN Sheryl [place], MD       leucovorin  844 mg in sodium chloride  0.9 % 250 mL infusion  400 mg/m2 (Treatment Plan Recorded) Intravenous Once Sheryl [place], MD       prochlorperazine  (COMPAZINE ) tablet 5 mg  5 mg Oral Q6H PRN Sheryl [place], MD   5 mg at 10/02/23 1047   sodium chloride  flush (NS) 0.9 % injection 10 mL  10 mL Intracatheter PRN Sheryl [place], MD       zolbetuximab-clzb (VYLOY) 700 mg in sodium chloride  0.9 % 105 mL (5 mg/mL) infusion  700 mg Intravenous Once Sheryl [place], MD        PHYSICAL EXAMINATION: ECOG PERFORMANCE STATUS: 1 - Symptomatic but completely ambulatory  Vitals:   10/02/23 0857  BP: 122/84  Pulse: (!) 103  Resp: 18  Temp: (!) 97 F (36.1 C)  SpO2: 99%   Wt Readings from Last 3 Encounters:  10/02/23 183 lb 6.4 oz (83.2 kg)  09/10/23 180 lb 12.8 oz (82 kg)  09/04/23 183 lb 8 oz (83.2 kg)     GENERAL:alert, no distress and comfortable SKIN: skin color, texture, turgor are normal, no rashes or significant lesions EYES: normal, Conjunctiva are pink and non-injected, sclera clear NECK: supple, thyroid  normal size, non-tender, without nodularity LYMPH:  no palpable lymphadenopathy in the cervical, axillary  LUNGS: clear to auscultation and percussion with normal breathing effort HEART: regular rate & rhythm and no murmurs and no lower extremity edema ABDOMEN:abdomen soft, non-tender and  normal bowel sounds Musculoskeletal:no cyanosis of digits and no clubbing  NEURO: alert & oriented x 3 with fluent speech, no focal motor/sensory deficits  Physical Exam    LABORATORY DATA:  I have reviewed the data as listed    Latest Ref Rng & Units 10/02/2023    8:36 AM 09/04/2023    7:46 AM 08/21/2023    7:48 AM  CBC  WBC 4.0 - 10.5 K/uL 7.3  2.8  3.0   Hemoglobin 12.0 - 15.0 g/dL  13.6  13.4  12.8   Hematocrit 36.0 - 46.0 % 39.0  36.7  36.5   Platelets 150 - 400 K/uL 325  291  252         Latest Ref Rng & Units 10/02/2023    8:36 AM 09/04/2023    7:46 AM 08/21/2023    7:48 AM  CMP  Glucose 70 - 99 mg/dL 161  096  045   BUN 6 - 20 mg/dL 8  8  6    Creatinine 0.44 - 1.00 mg/dL 4.09  8.11  9.14   Sodium 135 - 145 mmol/L 138  137  137   Potassium 3.5 - 5.1 mmol/L 3.3  3.2  3.6   Chloride 98 - 111 mmol/L 103  102  104   CO2 22 - 32 mmol/L 28  28  26    Calcium  8.9 - 10.3 mg/dL 8.9  8.9  8.5   Total Protein 6.5 - 8.1 g/dL 6.6  6.2  6.0   Total Bilirubin 0.0 - 1.2 mg/dL 0.5  0.5  0.8   Alkaline Phos 38 - 126 U/L 94  88  91   AST 15 - 41 U/L 25  18  20    ALT 0 - 44 U/L 15  12  13        RADIOGRAPHIC STUDIES: I have personally reviewed the radiological images as listed and agreed with the findings in the report. No results found.    Orders Placed This Encounter  Procedures   NM PET Image Restag (PS) Skull Base To Thigh    Standing Status:   Future    Expected Date:   10/23/2023    Expiration Date:   10/01/2024    If indicated for the ordered procedure, I authorize the administration of a radiopharmaceutical per Radiology protocol:   Yes    Is the patient pregnant?:   No    Preferred imaging location?:   [facility]   All questions were answered. The patient knows to call the clinic with any problems, questions or concerns. No barriers to learning was detected. The total time spent in the appointment was 30 minutes, including review of chart and various tests results,  discussions about plan of care and coordination of care plan     Sheryl [place], MD 10/02/2023

## 2023-10-03 ENCOUNTER — Other Ambulatory Visit: Payer: Self-pay

## 2023-10-03 ENCOUNTER — Encounter: Payer: Self-pay | Admitting: Hematology

## 2023-10-03 DIAGNOSIS — C162 Malignant neoplasm of body of stomach: Secondary | ICD-10-CM

## 2023-10-03 NOTE — Progress Notes (Signed)
 Nutrition Follow-up:  Patient with gastric cancer. She is currently receiving reduced dose Zolbetuximab/5FU + Leucovorin  q42d (start 07/09/23)   Met with patient in infusion. Pt father present at visit. Pt reports increased episodes of vomiting. Foods she enjoys are not staying with her. Recalls episodes of vomiting after eating [employer] chicken and after part of double cheese burger from [employer]. Patient notes poor toleration to chicken salad last week. Has found chicken soup from [employer] does well and eating a lot of this recently. She often lays down after eating. Patient is drinking one bottle of water. Mostly drinks powerade.    Medications: reviewed   Labs: K 3.3, glucose 152  Anthropometrics: Wt 183 lb 6.4 oz today - increased   5/13 - 180 lb 12.8 oz  5/7 - 183 lb 8 oz 4/23 - 190 lb 7.6 oz    NUTRITION DIAGNOSIS: Unintended wt loss - stable    INTERVENTION:  Educated on strategies for nausea/vomiting -  recommend avoiding greasy fried foods, choosing cold/room temp foods/beverages, eating small frequent meals/snacks q3h Educated on remaining upright for ~45 minutes after meals to support digestion Support and encouragement     MONITORING, EVALUATION, GOAL: wt trends, intake   NEXT VISIT: Tuesday July 1 during infusion

## 2023-10-03 NOTE — Progress Notes (Signed)
 Signatera ordered and lab draw scheduled to be done with pump take down on 10/04/2023.  Online requisition done and kit given to Winda Hastings in lab.

## 2023-10-04 ENCOUNTER — Inpatient Hospital Stay

## 2023-10-04 ENCOUNTER — Encounter

## 2023-10-04 VITALS — BP 121/89 | HR 85 | Temp 98.3°F | Resp 16

## 2023-10-04 DIAGNOSIS — Z5112 Encounter for antineoplastic immunotherapy: Secondary | ICD-10-CM | POA: Diagnosis not present

## 2023-10-04 DIAGNOSIS — C162 Malignant neoplasm of body of stomach: Secondary | ICD-10-CM

## 2023-10-04 LAB — MISCELLANEOUS TEST

## 2023-10-04 MED ORDER — HEPARIN SOD (PORK) LOCK FLUSH 100 UNIT/ML IV SOLN
500.0000 [IU] | Freq: Once | INTRAVENOUS | Status: AC | PRN
  Administered 2023-10-04: 500 [IU]

## 2023-10-04 MED ORDER — SODIUM CHLORIDE 0.9% FLUSH
10.0000 mL | INTRAVENOUS | Status: DC | PRN
  Administered 2023-10-04: 10 mL

## 2023-10-04 MED ORDER — SODIUM CHLORIDE 0.9 % IV SOLN: INTRAVENOUS | Status: DC

## 2023-10-09 ENCOUNTER — Emergency Department (HOSPITAL_COMMUNITY)
Admission: EM | Admit: 2023-10-09 | Discharge: 2023-10-10 | Disposition: A | Discharge status: Home / Self Care | Attending: Emergency Medicine | Admitting: Emergency Medicine

## 2023-10-09 ENCOUNTER — Other Ambulatory Visit: Payer: Self-pay

## 2023-10-09 ENCOUNTER — Encounter (HOSPITAL_COMMUNITY): Payer: Self-pay

## 2023-10-09 DIAGNOSIS — K209 Esophagitis, unspecified without bleeding: Secondary | ICD-10-CM | POA: Diagnosis not present

## 2023-10-09 DIAGNOSIS — Z85028 Personal history of other malignant neoplasm of stomach: Secondary | ICD-10-CM | POA: Insufficient documentation

## 2023-10-09 DIAGNOSIS — E876 Hypokalemia: Secondary | ICD-10-CM | POA: Diagnosis not present

## 2023-10-09 DIAGNOSIS — E871 Hypo-osmolality and hyponatremia: Secondary | ICD-10-CM | POA: Insufficient documentation

## 2023-10-09 DIAGNOSIS — Z79899 Other long term (current) drug therapy: Secondary | ICD-10-CM | POA: Insufficient documentation

## 2023-10-09 DIAGNOSIS — R112 Nausea with vomiting, unspecified: Secondary | ICD-10-CM

## 2023-10-09 DIAGNOSIS — R739 Hyperglycemia, unspecified: Secondary | ICD-10-CM

## 2023-10-09 DIAGNOSIS — I10 Essential (primary) hypertension: Secondary | ICD-10-CM | POA: Insufficient documentation

## 2023-10-09 DIAGNOSIS — R0789 Other chest pain: Secondary | ICD-10-CM | POA: Diagnosis present

## 2023-10-09 DIAGNOSIS — R7309 Other abnormal glucose: Secondary | ICD-10-CM | POA: Insufficient documentation

## 2023-10-09 MED ORDER — METOCLOPRAMIDE HCL 5 MG/ML IJ SOLN
10.0000 mg | Freq: Once | INTRAMUSCULAR | Status: AC
  Administered 2023-10-10: 10 mg via INTRAVENOUS
  Filled 2023-10-09: qty 2

## 2023-10-09 MED ORDER — SODIUM CHLORIDE 0.9 % IV BOLUS
1000.0000 mL | Freq: Once | INTRAVENOUS | Status: AC
  Administered 2023-10-10: 1000 mL via INTRAVENOUS

## 2023-10-09 NOTE — ED Triage Notes (Signed)
 Pt currently receiving chemo, last treatment one week ago. Pt reports early this morning she began having chest pain and emesis. Pt reports pain radiates to abdomen as well. Denies any fevers at home.

## 2023-10-09 NOTE — ED Notes (Signed)
 PT request that PORT to accessed for blood work.

## 2023-10-09 NOTE — ED Provider Notes (Signed)
 [place] [facility] AT [facility] Provider Note   CSN: [id number] Arrival date & time: 10/09/23  2258     History  Chief Complaint  Patient presents with   Chest Pain   Emesis    Sheryl [name] is a 58 y.o. female.  The history is provided by the patient.  Chest Pain Associated symptoms: vomiting   Emesis She has history of hypertension, gastric cancer with peritoneal metastases and received an infusion of FOLFOX and zolbetuximab on 10/04/2023 and comes in with chest pain which woke her up at 6 AM.  Pain has been waxing and waning.  She describes a burning feeling in the central chest radiating to the upper abdomen and mild radiation to the back.  There is associated dyspnea, nausea, vomiting.  She has not had any diaphoresis.  Pain has been waxing and waning throughout the day with no particular pattern.  She does note pain is worse when she takes a deep breath but is not affected by body position or exertion.  She actually has been having vomiting for the last 4 days.  She has not been able to hold anything down.  This is in spite of using a scopolamine  patch and taking prochlorperazine  and promethazine  at home.  She is a non-smoker and denies history of diabetes or hyperlipidemia and denies family history of premature coronary atherosclerosis.   Home Medications Prior to Admission medications   Medication Sig Start Date End Date Taking? Authorizing Provider  acetaminophen  (TYLENOL ) 500 MG tablet Take 2 tablets (1,000 mg total) by mouth every 8 (eight) hours as needed (pain). 06/11/22   Arcadio Knuckles, MD  amLODipine  (NORVASC ) 10 MG tablet Take 1 tablet (10 mg total) by mouth daily. 08/27/23   Boscia, Heather E, NP  b complex vitamins capsule Take 1 capsule by mouth daily.    [provider]  calcium -vitamin D (OSCAL WITH D) 500-5 MG-MCG tablet Take 2 tablets by mouth 2 (two) times daily.    [provider]  famotidine  (PEPCID ) 20 MG tablet  Take 1 tablet (20 mg total) by mouth 2 (two) times daily. 09/06/23   Boscia, Heather E, NP  HYDROcodone -acetaminophen  (NORCO/VICODIN) 5-325 MG tablet Take 1 tablet by mouth every 6 (six) hours as needed for moderate pain (pain score 4-6). 06/12/23   Sonja Konawa, MD  lidocaine -prilocaine  (EMLA ) cream Apply 1 Application topically as needed. 07/09/23   Sonja Bryson, MD  potassium chloride  SA (KLOR-CON  M) 20 MEQ tablet Take 2 tablets (40 mEq total) by mouth 2 (two) times daily for 5 days, THEN 1 tablet (20 mEq total) 2 (two) times daily for 25 days. 09/17/23 10/17/23  Sonja Bosworth, MD  prochlorperazine  (COMPAZINE ) 10 MG tablet Take 1 tablet (10 mg total) by mouth every 6 (six) hours as needed for nausea or vomiting. 09/17/23   Sonja [place], MD  promethazine  (PHENERGAN ) 25 MG tablet Take 1 tablet (25 mg total) by mouth every 6 (six) hours as needed for nausea or vomiting. 09/04/23   Sonja Hutto, MD  scopolamine  (TRANSDERM-SCOP) 1 MG/3DAYS Place 1 patch (1.5 mg total) onto the skin every 3 (three) days. 07/03/23   Sonja [place], MD  sucralfate  (CARAFATE ) 1 g tablet Take 1 tablet (1 g total) by mouth 2 (two) times daily. 03/21/23   Mansouraty, Albino Alu., MD  zinc gluconate 50 MG tablet Take 50 mg by mouth daily.    [provider]      Allergies    Aspirin, Cyclobenzaprine,  Naproxen sodium, Zithromax [azithromycin dihydrate], Oxaliplatin , and Dilaudid [hydromorphone]    Review of Systems   Review of Systems  Cardiovascular:  Positive for chest pain.  Gastrointestinal:  Positive for vomiting.  All other systems reviewed and are negative.   Physical Exam Updated Vital Signs BP (!) 130/108   Pulse (!) 118   Temp 98.6 F (37 C) (Oral)   Resp 18   Ht 5' 8 (1.727 m)   LMP 06/29/2010   SpO2 98%   BMI 27.89 kg/m  Physical Exam Vitals and nursing note reviewed.   58 year old female, resting comfortably and in no acute distress. Vital signs are significant for elevated blood pressure and elevated heart rate.  Oxygen  saturation is 98%, which is normal. Head is normocephalic and atraumatic. PERRLA, EOMI. Oropharynx is clear. Neck is nontender and supple. Back is nontender and there is no CVA tenderness. Lungs are clear without rales, wheezes, or rhonchi. Chest is nontender.  Mediport is present on the right. Heart has regular rate and rhythm without murmur. Abdomen is soft, flat, with mild to moderate epigastric tenderness.  There is no rebound or guarding. Extremities have no cyanosis or edema, full range of motion is present. Skin is warm and dry without rash. Neurologic: Mental status is normal, cranial nerves are intact, moves all extremities equally.  ED Results / Procedures / Treatments   Labs (all labs ordered are listed, but only abnormal results are displayed) Labs Reviewed  COMPREHENSIVE METABOLIC PANEL WITH GFR - Abnormal; Notable for the following components:      Result Value   Sodium 134 (*)    Potassium 3.2 (*)    Glucose, Bld 133 (*)    Calcium  8.5 (*)    Total Protein 5.7 (*)    Albumin 3.1 (*)    All other components within normal limits  CBC WITH DIFFERENTIAL/PLATELET  LIPASE, BLOOD  TROPONIN I (HIGH SENSITIVITY)  TROPONIN I (HIGH SENSITIVITY)    EKG EKG Interpretation Date/Time:  Wednesday October 09 2023 23:10:05 EDT Ventricular Rate:  119 PR Interval:  146 QRS Duration:  81 QT Interval:  316 QTC Calculation: 445 R Axis:   32  Text Interpretation: Sinus tachycardia Atrial premature complex Abnormal R-wave progression, early transition When compared with ECG of 05/03/2022, HEART RATE has increased Confirmed by Alissa April ([id number]) on 10/09/2023 11:44:54 PM  Radiology CT Angio Chest PE W and/or Wo Contrast Result Date: 10/10/2023 CLINICAL DATA:  Left-sided chest and abdominal pain, history of gastric carcinoma EXAM: CT ANGIOGRAPHY CHEST CT ABDOMEN AND PELVIS WITH CONTRAST TECHNIQUE: Multidetector CT imaging of the chest was performed using the standard protocol  during bolus administration of intravenous contrast. Multiplanar CT image reconstructions and MIPs were obtained to evaluate the vascular anatomy. Multidetector CT imaging of the abdomen and pelvis was performed using the standard protocol during bolus administration of intravenous contrast. RADIATION DOSE REDUCTION: This exam was performed according to the departmental dose-optimization program which includes automated exposure control, adjustment of the mA and/or kV according to patient size and/or use of iterative reconstruction technique. CONTRAST:  OMNIPAQUE  IOHEXOL  350 MG/ML SOLN COMPARISON:  06/03/2023 PET-CT FINDINGS: CTA CHEST FINDINGS Cardiovascular: Thoracic aorta shows a normal branching pattern. No cardiac enlargement is seen. Pulmonary artery shows a normal branching pattern bilaterally. No filling defect to suggest pulmonary embolism is noted. Right chest wall port is noted. Mediastinum/Nodes: Thoracic inlet is within normal limits. No hilar or mediastinal adenopathy is noted. The esophagus as visualized is within normal limits.  Lungs/Pleura: Lungs are well aerated bilaterally. No focal infiltrate or sizable effusion is seen. No parenchymal nodules are seen. Musculoskeletal: No chest wall abnormality. No acute or significant osseous findings. Review of the MIP images confirms the above findings. CT ABDOMEN and PELVIS FINDINGS Hepatobiliary: No focal liver abnormality is seen. No gallstones, gallbladder wall thickening, or biliary dilatation. Pancreas: Unremarkable. No pancreatic ductal dilatation or surrounding inflammatory changes. Spleen: Normal in size without focal abnormality. Adrenals/Urinary Tract: Adrenal glands are within normal limits. Kidneys demonstrate a normal enhancement pattern bilaterally. Left renal cyst is noted and stable. No follow-up is recommended. Mild fullness of the right renal collecting system is noted without obstructive change. This is stable from the prior PET-CT.  Stomach/Bowel: Stomach demonstrates hyperemia consistent with the known history of gastric carcinoma. No perforation is seen. No obstructive or inflammatory changes of the colon are noted. Scattered fecal material is seen. The appendix is within normal limits. Small bowel is unremarkable. Vascular/Lymphatic: No significant vascular findings are present. A few scattered lymph nodes are noted in the region of the splenic hilum similar to that seen on prior exam. No other lymphadenopathy is noted. Reproductive: Status post hysterectomy. No adnexal masses. Other: There is a soft tissue mass in the left upper quadrant interposed between the spleen and left kidney. This measures 3.4 cm in greatest dimension and is stable from the prior exam. Musculoskeletal: No acute or significant osseous findings. Review of the MIP images confirms the above findings. IMPRESSION: CTA of the chest: No evidence of pulmonary emboli. No acute abnormality noted. CT of the abdomen and pelvis: Hyperemia in the stomach consistent with the patient's known history of gastric carcinoma. Soft tissue mass in the left upper quadrant stable from the prior PET-CT. Electronically Signed   By: Violeta Grey M.D.   On: 10/10/2023 03:25   CT ABDOMEN PELVIS W CONTRAST Result Date: 10/10/2023 CLINICAL DATA:  Left-sided chest and abdominal pain, history of gastric carcinoma EXAM: CT ANGIOGRAPHY CHEST CT ABDOMEN AND PELVIS WITH CONTRAST TECHNIQUE: Multidetector CT imaging of the chest was performed using the standard protocol during bolus administration of intravenous contrast. Multiplanar CT image reconstructions and MIPs were obtained to evaluate the vascular anatomy. Multidetector CT imaging of the abdomen and pelvis was performed using the standard protocol during bolus administration of intravenous contrast. RADIATION DOSE REDUCTION: This exam was performed according to the departmental dose-optimization program which includes automated exposure control,  adjustment of the mA and/or kV according to patient size and/or use of iterative reconstruction technique. CONTRAST:  OMNIPAQUE  IOHEXOL  350 MG/ML SOLN COMPARISON:  06/03/2023 PET-CT FINDINGS: CTA CHEST FINDINGS Cardiovascular: Thoracic aorta shows a normal branching pattern. No cardiac enlargement is seen. Pulmonary artery shows a normal branching pattern bilaterally. No filling defect to suggest pulmonary embolism is noted. Right chest wall port is noted. Mediastinum/Nodes: Thoracic inlet is within normal limits. No hilar or mediastinal adenopathy is noted. The esophagus as visualized is within normal limits. Lungs/Pleura: Lungs are well aerated bilaterally. No focal infiltrate or sizable effusion is seen. No parenchymal nodules are seen. Musculoskeletal: No chest wall abnormality. No acute or significant osseous findings. Review of the MIP images confirms the above findings. CT ABDOMEN and PELVIS FINDINGS Hepatobiliary: No focal liver abnormality is seen. No gallstones, gallbladder wall thickening, or biliary dilatation. Pancreas: Unremarkable. No pancreatic ductal dilatation or surrounding inflammatory changes. Spleen: Normal in size without focal abnormality. Adrenals/Urinary Tract: Adrenal glands are within normal limits. Kidneys demonstrate a normal enhancement pattern bilaterally. Left renal cyst  is noted and stable. No follow-up is recommended. Mild fullness of the right renal collecting system is noted without obstructive change. This is stable from the prior PET-CT. Stomach/Bowel: Stomach demonstrates hyperemia consistent with the known history of gastric carcinoma. No perforation is seen. No obstructive or inflammatory changes of the colon are noted. Scattered fecal material is seen. The appendix is within normal limits. Small bowel is unremarkable. Vascular/Lymphatic: No significant vascular findings are present. A few scattered lymph nodes are noted in the region of the splenic hilum similar to  that seen on prior exam. No other lymphadenopathy is noted. Reproductive: Status post hysterectomy. No adnexal masses. Other: There is a soft tissue mass in the left upper quadrant interposed between the spleen and left kidney. This measures 3.4 cm in greatest dimension and is stable from the prior exam. Musculoskeletal: No acute or significant osseous findings. Review of the MIP images confirms the above findings. IMPRESSION: CTA of the chest: No evidence of pulmonary emboli. No acute abnormality noted. CT of the abdomen and pelvis: Hyperemia in the stomach consistent with the patient's known history of gastric carcinoma. Soft tissue mass in the left upper quadrant stable from the prior PET-CT. Electronically Signed   By: Violeta Grey M.D.   On: 10/10/2023 03:25    Procedures Procedures  Cardiac monitor shows borderline sinus tachycardia, per my interpretation.  Heart rate has varied from 95-105 while I was in the room with her which is significantly lower than recorded heart rate at triage and heart rate on her electrocardiogram.  Medications Ordered in ED Medications  pantoprazole  (PROTONIX ) injection 40 mg (has no administration in time range)  heparin  lock flush 100 unit/mL (has no administration in time range)  metoCLOPramide  (REGLAN ) injection 10 mg (10 mg Intravenous Given 10/10/23 0022)  sodium chloride  0.9 % bolus 1,000 mL (0 mLs Intravenous Stopped 10/10/23 0122)  iohexol  (OMNIPAQUE ) 350 MG/ML injection 100 mL (100 mLs Intravenous Contrast Given 10/10/23 0248)    ED Course/ Medical Decision Making/ A&P                                 Medical Decision Making Amount and/or Complexity of Data Reviewed Labs: ordered. Radiology: ordered.  Risk Prescription drug management.   Chest pain in the setting of persistent vomiting.  This is a presentation with a wide range of treatment options and involves a high risk of morbidity and complications.  Differential diagnosis includes, but is  not limited to, ACS, pulmonary embolism, esophagitis, cholecystitis, pancreatitis.  Doubt pneumonia.  In the setting of vomiting for several days, esophagitis is the most likely diagnosis.  Persistent vomiting is likely related to her chemotherapy.  I have reviewed her electrocardiogram, and my interpretation is sinus tachycardia with a PAC but no ST or T changes.  I have ordered laboratory workup including CBC, comprehensive metabolic panel, lipase, troponin x 2.  I have ordered CT angiogram of the chest and CT of abdomen and pelvis.  I have ordered IV fluids, metoclopramide  for nausea.  I have reviewed her past records, and office visit on 10/02/2023 showing treatment for gastric cancer with peritoneal metastases being treated with FOLFOX and zolbetuximab.  I have reviewed her laboratory tests, and my interpretation is mild hyponatremia which is not felt to be clinically significant, hypokalemia which is stable and likely secondary to repeated emesis, elevated random glucose level not significantly changed from recent values and will need to  be followed as an outpatient, normal troponin x 2, normal CBC.  CT scans showed no evidence of pulmonary embolism, no evidence of pneumonia, stable findings in the stomach with no acute process identified.  I have independently viewed all of the images, and agree with the radiologist's interpretation.  I have reevaluated the patient, and she states she is feeling better.  She has not vomited while in the ED although she does continue to have nausea.  She feels that the metoclopramide  did not give her any better improvement of her nausea and the promethazine  and prochlorperazine  that she has at home.  She does take potassium supplements at home and I have advised her to continue taking them.  I feel her pain is secondary to esophagitis related to repeated episodes of emesis.  I have ordered a dose of pantoprazole  and I am discharging her with a prescription for pantoprazole .   Follow-up with her primary care provider and her oncologist.  Return precautions discussed.  Final Clinical Impression(s) / ED Diagnoses Final diagnoses:  Esophagitis  Nausea and vomiting, unspecified vomiting type  Hypokalemia due to excessive gastrointestinal loss of potassium  Hyponatremia  Elevated random blood glucose level    Rx / DC Orders ED Discharge Orders          Ordered    pantoprazole  (PROTONIX ) 40 MG tablet  Daily        10/10/23 0351              Alissa April, MD 10/10/23 [phone]

## 2023-10-10 ENCOUNTER — Other Ambulatory Visit: Payer: Self-pay

## 2023-10-10 ENCOUNTER — Encounter (HOSPITAL_COMMUNITY): Payer: Self-pay

## 2023-10-10 ENCOUNTER — Emergency Department (HOSPITAL_COMMUNITY)

## 2023-10-10 LAB — COMPREHENSIVE METABOLIC PANEL WITH GFR
ALT: 19 U/L (ref 0–44)
AST: 22 U/L (ref 15–41)
Albumin: 3.1 g/dL — ABNORMAL LOW (ref 3.5–5.0)
Alkaline Phosphatase: 77 U/L (ref 38–126)
Anion gap: 10 (ref 5–15)
BUN: 9 mg/dL (ref 6–20)
CO2: 24 mmol/L (ref 22–32)
Calcium: 8.5 mg/dL — ABNORMAL LOW (ref 8.9–10.3)
Chloride: 100 mmol/L (ref 98–111)
Creatinine, Ser: 0.61 mg/dL (ref 0.44–1.00)
GFR, Estimated: >60 mL/min (ref >60)
Glucose, Bld: 133 mg/dL — ABNORMAL HIGH (ref 70–99)
Potassium: 3.2 mmol/L — ABNORMAL LOW (ref 3.5–5.1)
Sodium: 134 mmol/L — ABNORMAL LOW (ref 135–145)
Total Bilirubin: 0.8 mg/dL (ref 0.0–1.2)
Total Protein: 5.7 g/dL — ABNORMAL LOW (ref 6.5–8.1)

## 2023-10-10 LAB — LIPASE, BLOOD: Lipase: 34 U/L (ref 11–51)

## 2023-10-10 LAB — CBC WITH DIFFERENTIAL/PLATELET
Abs Immature Granulocytes: 0.04 10*3/uL (ref 0.00–0.07)
Basophils Absolute: 0 10*3/uL (ref 0.0–0.1)
Basophils Relative: 1 %
Eosinophils Absolute: 0.2 10*3/uL (ref 0.0–0.5)
Eosinophils Relative: 4 %
HCT: 38.7 % (ref 36.0–46.0)
Hemoglobin: 13.6 g/dL (ref 12.0–15.0)
Immature Granulocytes: 1 %
Lymphocytes Relative: 12 %
Lymphs Abs: 0.7 10*3/uL (ref 0.7–4.0)
MCH: 30.1 pg (ref 26.0–34.0)
MCHC: 35.1 g/dL (ref 30.0–36.0)
MCV: 85.6 fL (ref 80.0–100.0)
Monocytes Absolute: 0.5 10*3/uL (ref 0.1–1.0)
Monocytes Relative: 8 %
Neutro Abs: 4.3 10*3/uL (ref 1.7–7.7)
Neutrophils Relative %: 74 %
Platelets: 289 10*3/uL (ref 150–400)
RBC: 4.52 MIL/uL (ref 3.87–5.11)
RDW: 13.6 % (ref 11.5–15.5)
WBC: 5.7 10*3/uL (ref 4.0–10.5)
nRBC: 0 % (ref 0.0–0.2)

## 2023-10-10 LAB — TROPONIN I (HIGH SENSITIVITY)
Troponin I (High Sensitivity): 5 ng/L (ref <18)
Troponin I (High Sensitivity): 7 ng/L (ref <18)

## 2023-10-10 MED ORDER — PANTOPRAZOLE SODIUM 40 MG IV SOLR
40.0000 mg | Freq: Once | INTRAVENOUS | Status: AC
  Administered 2023-10-10: 40 mg via INTRAVENOUS
  Filled 2023-10-10: qty 10

## 2023-10-10 MED ORDER — PANTOPRAZOLE SODIUM 40 MG PO TBEC
40.0000 mg | DELAYED_RELEASE_TABLET | Freq: Every day | ORAL | 0 refills | Status: DC
  Filled 2023-10-10: qty 30, 30d supply, fill #0

## 2023-10-10 MED ORDER — IOHEXOL 350 MG/ML SOLN
100.0000 mL | Freq: Once | INTRAVENOUS | Status: AC | PRN
  Administered 2023-10-10: 100 mL via INTRAVENOUS

## 2023-10-10 MED ORDER — HEPARIN SOD (PORK) LOCK FLUSH 100 UNIT/ML IV SOLN
500.0000 [IU] | Freq: Once | INTRAVENOUS | Status: AC
  Administered 2023-10-10: 500 [IU]

## 2023-10-10 NOTE — Discharge Instructions (Signed)
 Continue taking your nausea medications.  Your potassium was low today, but it should improve if you stop vomiting.  Continue taking your potassium supplement as prescribed.  Return to the emergency department if symptoms are not being adequately controlled, or if they are getting worse.

## 2023-10-12 ENCOUNTER — Other Ambulatory Visit: Payer: Self-pay | Admitting: Hematology

## 2023-10-14 ENCOUNTER — Other Ambulatory Visit: Payer: Self-pay | Admitting: Internal Medicine

## 2023-10-14 ENCOUNTER — Other Ambulatory Visit: Payer: Self-pay

## 2023-10-14 ENCOUNTER — Encounter: Payer: Self-pay | Admitting: Hematology

## 2023-10-14 MED ORDER — POTASSIUM CHLORIDE CRYS ER 20 MEQ PO TBCR
EXTENDED_RELEASE_TABLET | ORAL | 0 refills | Status: DC
  Filled 2023-10-14: qty 80, 30d supply, fill #0

## 2023-10-14 NOTE — Progress Notes (Unsigned)
.  lastfp

## 2023-10-16 ENCOUNTER — Encounter: Payer: Self-pay | Admitting: Nurse Practitioner

## 2023-10-16 ENCOUNTER — Inpatient Hospital Stay

## 2023-10-16 ENCOUNTER — Inpatient Hospital Stay (HOSPITAL_BASED_OUTPATIENT_CLINIC_OR_DEPARTMENT_OTHER): Admitting: Nurse Practitioner

## 2023-10-16 ENCOUNTER — Other Ambulatory Visit (HOSPITAL_COMMUNITY): Payer: Self-pay

## 2023-10-16 VITALS — BP 119/82 | HR 92 | Resp 12

## 2023-10-16 VITALS — BP 120/82 | HR 92 | Temp 97.2°F | Resp 17 | Ht 68.0 in | Wt 179.3 lb

## 2023-10-16 DIAGNOSIS — Z5112 Encounter for antineoplastic immunotherapy: Secondary | ICD-10-CM | POA: Diagnosis not present

## 2023-10-16 DIAGNOSIS — C162 Malignant neoplasm of body of stomach: Secondary | ICD-10-CM

## 2023-10-16 DIAGNOSIS — Z95828 Presence of other vascular implants and grafts: Secondary | ICD-10-CM

## 2023-10-16 LAB — CBC WITH DIFFERENTIAL (CANCER CENTER ONLY)
Abs Immature Granulocytes: 0.01 10*3/uL (ref 0.00–0.07)
Basophils Absolute: 0 10*3/uL (ref 0.0–0.1)
Basophils Relative: 1 %
Eosinophils Absolute: 0.3 10*3/uL (ref 0.0–0.5)
Eosinophils Relative: 7 %
HCT: 37.6 % (ref 36.0–46.0)
Hemoglobin: 13.5 g/dL (ref 12.0–15.0)
Immature Granulocytes: 0 %
Lymphocytes Relative: 22 %
Lymphs Abs: 0.9 10*3/uL (ref 0.7–4.0)
MCH: 30.1 pg (ref 26.0–34.0)
MCHC: 35.9 g/dL (ref 30.0–36.0)
MCV: 83.7 fL (ref 80.0–100.0)
Monocytes Absolute: 0.5 10*3/uL (ref 0.1–1.0)
Monocytes Relative: 13 %
Neutro Abs: 2.1 10*3/uL (ref 1.7–7.7)
Neutrophils Relative %: 57 %
Platelet Count: 292 10*3/uL (ref 150–400)
RBC: 4.49 MIL/uL (ref 3.87–5.11)
RDW: 14.1 % (ref 11.5–15.5)
WBC Count: 3.8 10*3/uL — ABNORMAL LOW (ref 4.0–10.5)
nRBC: 0 % (ref 0.0–0.2)

## 2023-10-16 LAB — CMP (CANCER CENTER ONLY)
ALT: 14 U/L (ref 0–44)
AST: 21 U/L (ref 15–41)
Albumin: 3.7 g/dL (ref 3.5–5.0)
Alkaline Phosphatase: 97 U/L (ref 38–126)
Anion gap: 7 (ref 5–15)
BUN: 8 mg/dL (ref 6–20)
CO2: 29 mmol/L (ref 22–32)
Calcium: 8.9 mg/dL (ref 8.9–10.3)
Chloride: 102 mmol/L (ref 98–111)
Creatinine: 0.68 mg/dL (ref 0.44–1.00)
GFR, Estimated: >60 mL/min (ref >60)
Glucose, Bld: 151 mg/dL — ABNORMAL HIGH (ref 70–99)
Potassium: 3.7 mmol/L (ref 3.5–5.1)
Sodium: 138 mmol/L (ref 135–145)
Total Bilirubin: 0.5 mg/dL (ref 0.0–1.2)
Total Protein: 6.2 g/dL — ABNORMAL LOW (ref 6.5–8.1)

## 2023-10-16 MED ORDER — OLANZAPINE 5 MG PO TABS
5.0000 mg | ORAL_TABLET | Freq: Once | ORAL | Status: AC
  Administered 2023-10-16: 5 mg via ORAL
  Filled 2023-10-16: qty 1

## 2023-10-16 MED ORDER — FAMOTIDINE IN NACL 20-0.9 MG/50ML-% IV SOLN
20.0000 mg | Freq: Once | INTRAVENOUS | Status: AC
  Administered 2023-10-16: 20 mg via INTRAVENOUS
  Filled 2023-10-16: qty 50

## 2023-10-16 MED ORDER — SODIUM CHLORIDE 0.9 % IV SOLN
INTRAVENOUS | Status: DC
Start: 2023-10-16 — End: 2023-10-16

## 2023-10-16 MED ORDER — LORAZEPAM 2 MG/ML IJ SOLN
0.5000 mg | Freq: Once | INTRAMUSCULAR | Status: AC | PRN
  Administered 2023-10-16: 0.5 mg via INTRAVENOUS
  Filled 2023-10-16: qty 1

## 2023-10-16 MED ORDER — SODIUM CHLORIDE 0.9% FLUSH
10.0000 mL | Freq: Once | INTRAVENOUS | Status: AC
  Administered 2023-10-16: 10 mL

## 2023-10-16 MED ORDER — DEXAMETHASONE SODIUM PHOSPHATE 10 MG/ML IJ SOLN
10.0000 mg | Freq: Once | INTRAMUSCULAR | Status: AC
  Administered 2023-10-16: 10 mg via INTRAVENOUS
  Filled 2023-10-16: qty 1

## 2023-10-16 MED ORDER — PROMETHAZINE HCL 50 MG PO TABS
50.0000 mg | ORAL_TABLET | Freq: Two times a day (BID) | ORAL | 1 refills | Status: DC | PRN
  Filled 2023-10-16: qty 45, 23d supply, fill #0
  Filled 2023-11-26: qty 45, 23d supply, fill #1

## 2023-10-16 MED ORDER — SODIUM CHLORIDE 0.9 % IV SOLN
2100.0000 mg/m2 | INTRAVENOUS | Status: DC
  Administered 2023-10-16: 4450 mg via INTRAVENOUS
  Filled 2023-10-16: qty 89

## 2023-10-16 MED ORDER — ZOLBETUXIMAB-CLZB CHEMO 100MG/5ML IV SOLN
700.0000 mg | Freq: Once | INTRAVENOUS | Status: AC
  Administered 2023-10-16: 700 mg via INTRAVENOUS
  Filled 2023-10-16: qty 35

## 2023-10-16 MED ORDER — DIPHENHYDRAMINE HCL 25 MG PO CAPS
25.0000 mg | ORAL_CAPSULE | Freq: Once | ORAL | Status: AC
  Administered 2023-10-16: 25 mg via ORAL
  Filled 2023-10-16: qty 1

## 2023-10-16 MED ORDER — METOCLOPRAMIDE HCL 10 MG PO TABS
10.0000 mg | ORAL_TABLET | Freq: Three times a day (TID) | ORAL | 1 refills | Status: AC | PRN
  Filled 2023-10-16: qty 60, 20d supply, fill #0

## 2023-10-16 MED ORDER — PROCHLORPERAZINE MALEATE 10 MG PO TABS
5.0000 mg | ORAL_TABLET | Freq: Four times a day (QID) | ORAL | Status: DC | PRN
  Administered 2023-10-16: 5 mg via ORAL
  Filled 2023-10-16: qty 1

## 2023-10-16 MED ORDER — PALONOSETRON HCL INJECTION 0.25 MG/5ML
0.2500 mg | Freq: Once | INTRAVENOUS | Status: AC
  Administered 2023-10-16: 0.25 mg via INTRAVENOUS
  Filled 2023-10-16: qty 5

## 2023-10-16 MED ORDER — SODIUM CHLORIDE 0.9 % IV SOLN
400.0000 mg/m2 | Freq: Once | INTRAVENOUS | Status: AC
  Administered 2023-10-16: 844 mg via INTRAVENOUS
  Filled 2023-10-16: qty 25

## 2023-10-16 MED ORDER — SODIUM CHLORIDE 0.9 % IV SOLN
150.0000 mg | Freq: Once | INTRAVENOUS | Status: AC
  Administered 2023-10-16: 150 mg via INTRAVENOUS
  Filled 2023-10-16: qty 150

## 2023-10-16 NOTE — Assessment & Plan Note (Addendum)
 [id number] with peritoneal metastasis. 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   -she started first line chemo FLOT on 1/10 -She understands that chemotherapy is palliative, to prolong her life.  We are unlikely going to cure her cancer. -PD-L1 0-1%, no significant benefit from PD-L1 immunotherapy, FO revealed FGFR2 amplification and fusion (+), FGFR inhibitors can be considered in future, no other targeted therapy available  -She has been tolerating chemo very well, will continue for now  -PET scan from 05/30/2022 was negative for primary tumor or metastatic disease, the known peritoneal mets did not show on PET.  -she has been tolerating chemo well overall. Due to fatigue, I have changed her chemo from FLOT to FOLFOX on 06/20/2022, she tolerated well -she previously asked the role of surgery, depends on her next restaging CT scan findings, I may refer her to [facility] or [facility] to discuss HIPEC surgery  -due to her infusion reaction to oxaliplatin  on C5, we added additional premeds and gave slow infusion over 4 hours for cycle 6 and she tolerated well  -She is not able to return to work due to the cancer and treatment related symptoms.  -She is tolerating FOLFOX well overall, with moderate fatigue for a few days after infusion but able to recover well.  No signs of neuropathy at this point. -Restaging CT abdomen pelvis from August 27, 2022 showed no residual disease.  -We again discussed maintenance therapy with Xeloda  down the road, we will stop oxaliplatin  when she develops side effects especially neuropathy, or after next scan -repeated staging CT from 11/26/2021 showed stable disease  -I have changed her treatment to maintenance Xeloda  in early August 2024, she is tolerating well overall  -her NGS Caris showed positive Claudin 18.2, she is a  candidate for zolbetuximab.  -Repeated EGD on March 21, 2023 showed residual gastric cancer.  We discussed option of changing her chemotherapy back to FOLFOX and add zolbetuximab.  -PET 06/03/2023 showed stable disease -Patient developed recurrent abdominal pain, similar to the symptoms she had when she was diagnosed.  She agreed to change treatment back to FOLFOX, and add Zolbetuximab. She started on 07/09/2023. She tolerated first cycle poorly and had prolonged recovery. Oxaliplatin  was stop after cycle 1. She continued 5-fu/LV and zolbe every 2 weeks  -experienced significant nausea and vomiting related to chemotherapy. She has restaging PET scheduled for 10/25/2023. She did have visit to the ED on 10/11/2023 due to hyperemesis. She did have chest pain and intractable vomiting. She had CtA of the chest which was negative for PE. CT abdomen and pelvis was stable. Symptoms are improved.  -10/16/2023 - today, she presents for cycle 3 day 1 FOLFOX and Zolmituxumab. Restaging PET CT scheduled for 10/25/2023.

## 2023-10-16 NOTE — Progress Notes (Signed)
 Vyloy  D1C3 (6th infusion) Pt received IV pepcid , aloxi , decadron  and emend starting at 1000, followed by PO premeds and IV ativan  push at 1100. This was in an attempt to prolong the length of the effects of the sedation premeds further into treatment. Due to an unforseen delay in pharmacy, Vyloy  not started until 1200. Vyloy  rate started at 22ml/hr x30 min, increasing to 4ml/hr x6min, 60ml/hr x30min and 80ml/hr for the remainder of treatment. Slow flush followed by fast flush. Total treatment time was 3hrs20min. Leucovorin  and 5FU pump given over 30 min during 2 hour post observation treatment. Pt slept the entirety of treatment with no nausea or vomiting. At discharge, pt vitals stable and ambulated to lobby.  Pt had c/o n/v at night after other treatments. Rande Bushy, PA suggested increasing phenergan  to 50mg  at night and adding reglan  to try to ease night time symptoms. Medications called into [employer].

## 2023-10-16 NOTE — Progress Notes (Signed)
 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   -she started first line chemo FLOT on 1/10 -She understands that chemotherapy is palliative, to prolong her life.  We are unlikely going to cure her cancer. -PD-L1 0-1%, no significant benefit from PD-L1 immunotherapy, FO revealed FGFR2 amplification and fusion (+), FGFR inhibitors can be considered in future, no other targeted therapy available  -She has been tolerating chemo very well, will continue for now  -PET scan from 05/30/2022 was negative for primary tumor or metastatic disease, the known peritoneal mets did not show on PET.  -she has been tolerating chemo well overall. Due to fatigue, I have changed her chemo from FLOT to FOLFOX on 06/20/2022, she tolerated well -she previously asked the role of surgery, depends on her next restaging CT scan findings, I may refer her to [facility] or [facility] to discuss HIPEC surgery  -due to her infusion reaction to oxaliplatin  on C5, we added additional premeds and gave slow infusion over 4 hours for cycle 6 and she tolerated well  -She is not able to return to work due to the cancer and treatment related symptoms.  -She is tolerating FOLFOX well overall, with moderate fatigue for a few days after infusion but able to recover well.  No signs of neuropathy at this point. -Restaging CT abdomen pelvis from August 27, 2022 showed no residual disease.  -We again discussed maintenance therapy with Xeloda  down the road, we will stop oxaliplatin  when she develops side effects  especially neuropathy, or after next scan -repeated staging CT from 11/26/2021 showed stable disease  -I have changed her treatment to maintenance Xeloda  in early August 2024, she is tolerating well overall  -her NGS Caris showed positive Claudin 18.2, she is a candidate for zolbetuximab.  -Repeated EGD on March 21, 2023 showed residual gastric cancer.  We discussed option of changing her chemotherapy back to FOLFOX and add zolbetuximab.  -PET 06/03/2023 showed stable disease -Patient developed recurrent abdominal pain, similar to the symptoms she had when she was diagnosed.  She agreed to change treatment back to FOLFOX, and add Zolbetuximab. She started on 07/09/2023. She tolerated first cycle poorly and had prolonged recovery. Oxaliplatin  was stop after cycle 1. She continued 5-fu/LV and zolbe every 2 weeks  -experienced significant nausea and vomiting related to chemotherapy. She has restaging PET scheduled for 10/25/2023. She did have visit to the ED on 10/11/2023 due to hyperemesis. She did have chest pain and intractable vomiting. She had CtA of the chest which was negative for PE. CT abdomen and pelvis was stable. Symptoms are improved.  -10/16/2023 - today, she presents for cycle 3 day 1 FOLFOX and Zolmituxumab. Restaging PET CT scheduled for 10/25/2023.   Nausea and vomiting ED visit for intractable nausea and vomiting 10/11/2023. CTA of chest was negative for PE. She was treated with IVF and discharged home. Controls her symptoms with combination of compazine , promethazine , and Scopolamine  patches. Declines zofran  due to side effect of constipation.   Plan: Reviewed labs. -mild leukopenia with WBC 3.8 and normal ANC.  -potassium normal at 3.7 and remainder of  cmp was unremarkable.  Manage nausea with prescribed prn medications. She wishes for next fills to go to community pharmacy at [place] [facility].  Recommend she keep appointment for PET Ct scheduled for 10/25/2023. Labs/flush, follow up, and  treatment as scheduled in 2 weeks.   The patient understands the plans discussed today and is in agreement with them.  She knows to contact our office if she develops concerns prior to her next appointment.  I provided 25 minutes of face-to-face time during this encounter and > 50% was spent counseling as documented under my assessment and plan.    Sharyon Deis, NP  [facility] - A DEPT OF [facility] [address] [place] [address] Dept: [phone] Dept Fax: [phone]   No orders of the defined types were placed in this encounter.     CHIEF COMPLAINT:  CC: gastric cancer   Current Treatment:  5-FU and zolbetuximab every 2 weeks  INTERVAL HISTORY:  Sheryl [name] is here today for repeat clinical assessment. She was last seen by Dr. Maryalice Smaller on 10/02/2023. She has experienced significant nausea and vomiting related to chemotherapy. She has restaging PET scheduled for 10/25/2023. She did have visit to the ED on 10/11/2023 due to hyperemesis. She did have chest pain and intractable vomiting. She had CtA of the chest which was negative for PE. CT abdomen and pelvis was stable. She states that she did have additional episode of vomiting this morning. Overall, she feels as though this is better controlled. Uses compazine , promethazine , and scopolamine  patch to help with nausea. She states that she does still have nausea despite using the combination of medication. She does avoid zofran  as it causes her significant constipation. She denies further instances of chest pain, chest pressure, or shortness of breath. She denies headaches or visual disturbances. She does have some generalized abdominal pain.  She denies fevers or chills. Ger appetite is fair at best. She finds that tea sits better on her stomach than water.  Her weight has decreased 4 pounds over last 2 weeks.  I have reviewed the past medical history, past surgical  history, social history and family history with the patient and they are unchanged from previous note.  ALLERGIES:  is allergic to aspirin, cyclobenzaprine, naproxen sodium, zithromax [azithromycin dihydrate], oxaliplatin , and dilaudid [hydromorphone].  MEDICATIONS:  Current Outpatient Medications  Medication Sig Dispense Refill   acetaminophen  (TYLENOL ) 500 MG tablet Take 2 tablets (1,000 mg total) by mouth every 8 (eight) hours as needed (pain). 30 tablet 1   amLODipine  (NORVASC ) 10 MG tablet Take 1 tablet (10 mg total) by mouth daily. 30 tablet 2   b complex vitamins capsule Take 1 capsule by mouth daily.     calcium -vitamin D (OSCAL WITH D) 500-5 MG-MCG tablet Take 2 tablets by mouth 2 (two) times daily.     famotidine  (PEPCID ) 20 MG tablet Take 1 tablet (20 mg total) by mouth 2 (two) times daily. 60 tablet 2   HYDROcodone -acetaminophen  (NORCO/VICODIN) 5-325 MG tablet Take 1 tablet by mouth every 6 (six) hours as needed for moderate pain (pain score 4-6). 20 tablet 0   lidocaine -prilocaine  (EMLA ) cream Apply 1 Application topically as needed. 30 g 1   pantoprazole  (PROTONIX ) 40 MG tablet Take 1 tablet (40 mg total) by mouth daily. 30 tablet 0   potassium chloride  SA (KLOR-CON  M) 20 MEQ tablet Take 2 tablets (40 mEq total) by mouth 2 (two) times  daily for 5 days, THEN 1 tablet (20 mEq total) 2 (two) times daily for 25 days. 80 tablet 0   prochlorperazine  (COMPAZINE ) 10 MG tablet Take 1 tablet (10 mg total) by mouth every 6 (six) hours as needed for nausea or vomiting. 30 tablet 2   promethazine  (PHENERGAN ) 25 MG tablet Take 1 tablet (25 mg total) by mouth every 6 (six) hours as needed for nausea or vomiting. 60 tablet 1   scopolamine  (TRANSDERM-SCOP) 1 MG/3DAYS Place 1 patch (1.5 mg total) onto the skin every 3 (three) days. 10 patch 1   sucralfate  (CARAFATE ) 1 g tablet Take 1 tablet (1 g total) by mouth 2 (two) times daily. 60 tablet 6   zinc gluconate 50 MG tablet Take 50 mg by mouth daily.      No current facility-administered medications for this visit.   Facility-Administered Medications Ordered in Other Visits  Medication Dose Route Frequency Provider Last Rate Last Admin   0.9 %  sodium chloride  infusion   Intravenous Continuous Sonja Mesilla, MD 10 mL/hr at 10/16/23 0955 New Bag at 10/16/23 0955   dexamethasone  (DECADRON ) injection 10 mg  10 mg Intravenous Once Sonja Avon, MD       diphenhydrAMINE  (BENADRYL ) capsule 25 mg  25 mg Oral Once Sonja Sundown, MD       famotidine  (PEPCID ) IVPB 20 mg premix  20 mg Intravenous Once Sonja Sonoma, MD       fluorouracil  (ADRUCIL ) 4,450 mg in sodium chloride  0.9 % 61 mL chemo infusion  2,100 mg/m2 (Treatment Plan Recorded) Intravenous 1 day or 1 dose Sonja Forest, MD       fosaprepitant  (EMEND) 150 mg in sodium chloride  0.9 % 145 mL IVPB  150 mg Intravenous Once Sonja McArthur, MD       leucovorin  844 mg in sodium chloride  0.9 % 250 mL infusion  400 mg/m2 (Treatment Plan Recorded) Intravenous Once Sonja [place], MD       LORazepam  (ATIVAN ) injection 0.5 mg  0.5 mg Intravenous Once PRN Sonja Kane, MD       OLANZapine  (ZYPREXA ) tablet 5 mg  5 mg Oral Once Sonja Haughton, MD       prochlorperazine  (COMPAZINE ) tablet 5 mg  5 mg Oral Q6H PRN Sonja Freeport, MD       zolbetuximab-clzb  (VYLOY ) 700 mg in sodium chloride  0.9 % 105 mL (5 mg/mL) infusion  700 mg Intravenous Once Sonja , MD        HISTORY OF PRESENT ILLNESS:   Oncology History Overview Note   Cancer Staging  Gastric cancer [facility]) Staging form: Stomach, AJCC 8th Edition - Clinical stage from 04/19/2022: Stage IVB (cT2, cN0, pM1) - Signed by Sonja , MD on 05/08/2022 Total positive nodes: 0     Gastric cancer (HCC)  03/30/2022 Procedure   EGD:  Impression:  - Normal esophagus. - A few gastric polyps. Biopsied. - Gastritis. Biopsied. - Non-bleeding gastric ulcer with no stigmata of bleeding. Biopsied. - Normal examined duodenum. Biopsied.  Findings: Diffuse moderate inflammation characterized by  congestion (edema), friability and granularity was found in the cardia, in the gastric fundus and in the gastric body. There were associated erosions in multiple places. Biopsies were taken from the antrum, body, and fundus with a cold forceps for histology. Estimated blood loss was minimal.  One non-bleeding cratered gastric ulcer with no stigmata of bleeding was found on the greater curvature of the stomach. The lesion was 6 mm in largest dimension. The mucosa around the ulcer  was heaped and led to some deformity in the antrum. Biopsies were taken with a cold forceps for histology. Estimated blood loss was minimal.    03/30/2022 Pathology Results   Patient: Kren, Ammarie P  Accession: [id number]  Diagnosis 1. Surgical [P], duodenal - BENIGN SMALL BOWEL MUCOSA WITH NO SIGNIFICANT PATHOLOGIC CHANGES 2. Surgical [P], gastric antrum - GASTRIC ANTRAL MUCOSA WITH FEATURES OF REACTIVE GASTROPATHY - NEGATIVE FOR H. PYLORI ON H&E STAIN - NEGATIVE FOR INTESTINAL METAPLASIA OR MALIGNANCY 3. Surgical [P], gastric body - GASTRIC OXYNTIC MUCOSA WITH REACTIVE/REPARATIVE CHANGES - NEGATIVE FOR H. PYLORI ON H&E STAIN - NEGATIVE FOR INTESTINAL METAPLASIA, DYSPLASIA OR MALIGNANCY 4. Surgical [P], greater curve ulceration - ADENOCARCINOMA WITH SIGNET RING CELL FEATURES (SEE NOTE) 5. Surgical [P], gastric polyps - ADENOCARCINOMA WITH SIGNET RING CELL FEATURES (SEE NOTE) 6. Surgical [P], fundus (gastric) - ADENOCARCINOMA WITH SIGNET RING CELL FEATURES (SEE NOTE) 7. Surgical [P], colon, ascending, polyp (1) - TUBULAR ADENOMA. - NO HIGH GRADE DYSPLASIA OR MALIGNANCY. 8. Surgical [P], colon, transverse, polyp (1) - TUBULAR ADENOMA. - NO HIGH GRADE DYSPLASIA OR MALIGNANCY.    04/13/2022 Initial Diagnosis   Gastric cancer (HCC)   04/19/2022 Cancer Staging   Staging form: Stomach, AJCC 8th Edition - Clinical stage from 04/19/2022: Stage IVB (cT2, cN0, pM1) - Signed by Sonja Ellenton, MD on  05/08/2022 Total positive nodes: 0   05/05/2022 Genetic Testing   Negative genetic testing on the Multi-cancer gene panel + RNA.  FH c.259C>T VUS identified.  The report date is May 05, 2022.  The Multi-Cancer + RNA Panel offered by [facility] includes sequencing and/or deletion/duplication analysis of the following 70 genes:  AIP*, ALK, APC*, ATM*, AXIN2*, BAP1*, BARD1*, BLM*, BMPR1A*, BRCA1*, BRCA2*, BRIP1*, CDC73*, CDH1*, CDK4, CDKN1B*, CDKN2A, CHEK2*, CTNNA1*, DICER1*, EPCAM (del/dup only), EGFR, FH*, FLCN*, GREM1 (promoter dup only), HOXB13, KIT, LZTR1, MAX*, MBD4, MEN1*, MET, MITF, MLH1*, MSH2*, MSH3*, MSH6*, MUTYH*, NF1*, NF2*, NTHL1*, PALB2*, PDGFRA, PMS2*, POLD1*, POLE*, POT1*, PRKAR1A*, PTCH1*, PTEN*, RAD51C*, RAD51D*, RB1*, RET, SDHA* (sequencing only), SDHAF2*, SDHB*, SDHC*, SDHD*, SMAD4*, SMARCA4*, SMARCB1*, SMARCE1*, STK11*, SUFU*, TMEM127*, TP53*, TSC1*, TSC2*, VHL*. RNA analysis is performed for * genes.    05/09/2022 - 06/07/2022 Chemotherapy   Patient is on Treatment Plan : GASTROESOPHAGEAL FLOT q14d X 4 cycles      Miscellaneous   Foundation One  Biomarker Findings Microsatellite status- Cannot be determined Tumor Mutational Burden- Cannot be determined  Genomic Findings  FGFR2 amplification,FGFR2-TACC2 fusion,  Rearrangement intron 17 ARAF amplification CCND3 amplification TP53 V229fs*74     05/30/2022 Imaging    IMPRESSION: 1. Mild hypermetabolism corresponding to a dominant left upper quadrant mass and smaller perigastric nodules or nodes. 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 Recommend attention to the dominant left upper quadrant soft tissue mass on follow-up exams to exclude unlikely recurrent lymphoma. 2. No gastric hypermetabolism and no typical findings of metastatic disease.   06/20/2022 - 11/16/2022 Chemotherapy   Patient is on Treatment Plan : GASTRIC FOLFOX q14d x 12 cycles     08/27/2022 Imaging     IMPRESSION: No focal gastric mass on CT.   No findings suspicious for recurrent or metastatic disease.   Stable left upper abdominal soft tissue lesion and small lymph nodes, chronic, favoring treated lymphoma.   11/27/2022 Imaging    IMPRESSION: 1. Questionable thickening of the distal esophagus/GE junction and gastric antrum, consider further evaluation with endoscopy. 2. Chronically stable left upper quadrant nodularity and prominent lymph  nodes again favored treated lymphoma. Continued attention on follow-up imaging suggested. 3. No convincing evidence of metastatic disease in the chest, abdomen or pelvis. 4. Questionable asymmetric wall thickening of the rectum, consider further evaluation with colonoscopy. 5. Mild wall thickening of a nondistended urinary bladder, correlate with urinalysis to exclude cystitis. 6. Hepatic steatosis.   07/10/2023 -  Chemotherapy   Patient is on Treatment Plan : GASTROESOPHAGEAL Zolbetuximab (800/400) + FOLFOX D1,15,29 q42d x 4 cycles / Zolbetuximab (400) + 5FU + Leucovorin  D1,15,29 q42d         REVIEW OF SYSTEMS:   Constitutional: Denies fevers or chills. Her appetite is fair. She has had a 4 pound weight loss since her last treatment.  Eyes: Denies blurriness of vision Ears, nose, mouth, throat, and face: Denies mucositis or sore throat Respiratory: Denies cough, dyspnea or wheezes Cardiovascular: Denies palpitation, chest discomfort or lower extremity swelling Gastrointestinal:  she has frequent nausea and vomiting. She gets some control with the combination of compazine , promethazine , and scopolamine  patches.  Skin: Denies abnormal skin rashes Lymphatics: Denies new lymphadenopathy or easy bruising Neurological:Denies numbness, tingling or new weaknesses Behavioral/Psych: Mood is stable, no new changes  All other systems were reviewed with the patient and are negative.   VITALS:   Today's Vitals   10/16/23 0908 10/16/23 0910   BP: 120/82   Pulse: 92   Resp: 17   Temp: (!) 97.2 F (36.2 C)   TempSrc: Temporal   SpO2: 99%   Weight: 179 lb 4.8 oz (81.3 kg)   Height: 5' 8 (1.727 m)   PainSc:  0-No pain   Body mass index is 27.26 kg/m.   Wt Readings from Last 3 Encounters:  10/16/23 179 lb 4.8 oz (81.3 kg)  10/02/23 183 lb 6.4 oz (83.2 kg)  09/10/23 180 lb 12.8 oz (82 kg)    Body mass index is 27.26 kg/m.  Performance status (ECOG): 1 - Symptomatic but completely ambulatory  PHYSICAL EXAM:   GENERAL:alert, no distress and comfortable SKIN: skin color, texture, turgor are normal, no rashes or significant lesions EYES: normal, Conjunctiva are pink and non-injected, sclera clear OROPHARYNX:no exudate, no erythema and lips, buccal mucosa, and tongue normal  NECK: supple, thyroid  normal size, non-tender, without nodularity LYMPH:  no palpable lymphadenopathy in the cervical, axillary or inguinal LUNGS: clear to auscultation and percussion with normal breathing effort HEART: regular rate & rhythm and no murmurs and no lower extremity edema ABDOMEN:abdomen soft with normal bowel sounds. She does have generalized abdominal tenderness with light palpation.  Musculoskeletal:no cyanosis of digits and no clubbing  NEURO: alert & oriented x 3 with fluent speech, no focal motor/sensory deficits  LABORATORY DATA:  I have reviewed the data as listed    Component Value Date/Time   NA 138 10/16/2023 0854   NA 137 12/20/2020 0957   K 3.7 10/16/2023 0854   CL 102 10/16/2023 0854   CO2 29 10/16/2023 0854   GLUCOSE 151 (H) 10/16/2023 0854   BUN 8 10/16/2023 0854   BUN 11 12/20/2020 0957   CREATININE 0.68 10/16/2023 0854   CALCIUM  8.9 10/16/2023 0854   PROT 6.2 (L) 10/16/2023 0854   PROT 7.3 12/20/2020 0957   ALBUMIN 3.7 10/16/2023 0854   ALBUMIN 4.7 12/20/2020 0957   AST 21 10/16/2023 0854   ALT 14 10/16/2023 0854   ALKPHOS 97 10/16/2023 0854   BILITOT 0.5 10/16/2023 0854   GFRNONAA >60 10/16/2023 0854    GFRAA 106 03/31/2020 1036  Lab Results  Component Value Date   WBC 3.8 (L) 10/16/2023   NEUTROABS 2.1 10/16/2023   HGB 13.5 10/16/2023   HCT 37.6 10/16/2023   MCV 83.7 10/16/2023   PLT 292 10/16/2023    RADIOGRAPHIC STUDIES: CT Angio Chest PE W and/or Wo Contrast Result Date: 10/10/2023 CLINICAL DATA:  Left-sided chest and abdominal pain, history of gastric carcinoma EXAM: CT ANGIOGRAPHY CHEST CT ABDOMEN AND PELVIS WITH CONTRAST TECHNIQUE: Multidetector CT imaging of the chest was performed using the standard protocol during bolus administration of intravenous contrast. Multiplanar CT image reconstructions and MIPs were obtained to evaluate the vascular anatomy. Multidetector CT imaging of the abdomen and pelvis was performed using the standard protocol during bolus administration of intravenous contrast. RADIATION DOSE REDUCTION: This exam was performed according to the departmental dose-optimization program which includes automated exposure control, adjustment of the mA and/or kV according to patient size and/or use of iterative reconstruction technique. CONTRAST:  OMNIPAQUE  IOHEXOL  350 MG/ML SOLN COMPARISON:  06/03/2023 PET-CT FINDINGS: CTA CHEST FINDINGS Cardiovascular: Thoracic aorta shows a normal branching pattern. No cardiac enlargement is seen. Pulmonary artery shows a normal branching pattern bilaterally. No filling defect to suggest pulmonary embolism is noted. Right chest wall port is noted. Mediastinum/Nodes: Thoracic inlet is within normal limits. No hilar or mediastinal adenopathy is noted. The esophagus as visualized is within normal limits. Lungs/Pleura: Lungs are well aerated bilaterally. No focal infiltrate or sizable effusion is seen. No parenchymal nodules are seen. Musculoskeletal: No chest wall abnormality. No acute or significant osseous findings. Review of the MIP images confirms the above findings. CT ABDOMEN and PELVIS FINDINGS Hepatobiliary: No focal liver  abnormality is seen. No gallstones, gallbladder wall thickening, or biliary dilatation. Pancreas: Unremarkable. No pancreatic ductal dilatation or surrounding inflammatory changes. Spleen: Normal in size without focal abnormality. Adrenals/Urinary Tract: Adrenal glands are within normal limits. Kidneys demonstrate a normal enhancement pattern bilaterally. Left renal cyst is noted and stable. No follow-up is recommended. Mild fullness of the right renal collecting system is noted without obstructive change. This is stable from the prior PET-CT. Stomach/Bowel: Stomach demonstrates hyperemia consistent with the known history of gastric carcinoma. No perforation is seen. No obstructive or inflammatory changes of the colon are noted. Scattered fecal material is seen. The appendix is within normal limits. Small bowel is unremarkable. Vascular/Lymphatic: No significant vascular findings are present. A few scattered lymph nodes are noted in the region of the splenic hilum similar to that seen on prior exam. No other lymphadenopathy is noted. Reproductive: Status post hysterectomy. No adnexal masses. Other: There is a soft tissue mass in the left upper quadrant interposed between the spleen and left kidney. This measures 3.4 cm in greatest dimension and is stable from the prior exam. Musculoskeletal: No acute or significant osseous findings. Review of the MIP images confirms the above findings. IMPRESSION: CTA of the chest: No evidence of pulmonary emboli. No acute abnormality noted. CT of the abdomen and pelvis: Hyperemia in the stomach consistent with the patient's known history of gastric carcinoma. Soft tissue mass in the left upper quadrant stable from the prior PET-CT. Electronically Signed   By: Violeta Grey M.D.   On: 10/10/2023 03:25   CT ABDOMEN PELVIS W CONTRAST Result Date: 10/10/2023 CLINICAL DATA:  Left-sided chest and abdominal pain, history of gastric carcinoma EXAM: CT ANGIOGRAPHY CHEST CT ABDOMEN AND  PELVIS WITH CONTRAST TECHNIQUE: Multidetector CT imaging of the chest was performed using the standard protocol during bolus administration of intravenous contrast.  Multiplanar CT image reconstructions and MIPs were obtained to evaluate the vascular anatomy. Multidetector CT imaging of the abdomen and pelvis was performed using the standard protocol during bolus administration of intravenous contrast. RADIATION DOSE REDUCTION: This exam was performed according to the departmental dose-optimization program which includes automated exposure control, adjustment of the mA and/or kV according to patient size and/or use of iterative reconstruction technique. CONTRAST:  OMNIPAQUE  IOHEXOL  350 MG/ML SOLN COMPARISON:  06/03/2023 PET-CT FINDINGS: CTA CHEST FINDINGS Cardiovascular: Thoracic aorta shows a normal branching pattern. No cardiac enlargement is seen. Pulmonary artery shows a normal branching pattern bilaterally. No filling defect to suggest pulmonary embolism is noted. Right chest wall port is noted. Mediastinum/Nodes: Thoracic inlet is within normal limits. No hilar or mediastinal adenopathy is noted. The esophagus as visualized is within normal limits. Lungs/Pleura: Lungs are well aerated bilaterally. No focal infiltrate or sizable effusion is seen. No parenchymal nodules are seen. Musculoskeletal: No chest wall abnormality. No acute or significant osseous findings. Review of the MIP images confirms the above findings. CT ABDOMEN and PELVIS FINDINGS Hepatobiliary: No focal liver abnormality is seen. No gallstones, gallbladder wall thickening, or biliary dilatation. Pancreas: Unremarkable. No pancreatic ductal dilatation or surrounding inflammatory changes. Spleen: Normal in size without focal abnormality. Adrenals/Urinary Tract: Adrenal glands are within normal limits. Kidneys demonstrate a normal enhancement pattern bilaterally. Left renal cyst is noted and stable. No follow-up is recommended. Mild fullness  of the right renal collecting system is noted without obstructive change. This is stable from the prior PET-CT. Stomach/Bowel: Stomach demonstrates hyperemia consistent with the known history of gastric carcinoma. No perforation is seen. No obstructive or inflammatory changes of the colon are noted. Scattered fecal material is seen. The appendix is within normal limits. Small bowel is unremarkable. Vascular/Lymphatic: No significant vascular findings are present. A few scattered lymph nodes are noted in the region of the splenic hilum similar to that seen on prior exam. No other lymphadenopathy is noted. Reproductive: Status post hysterectomy. No adnexal masses. Other: There is a soft tissue mass in the left upper quadrant interposed between the spleen and left kidney. This measures 3.4 cm in greatest dimension and is stable from the prior exam. Musculoskeletal: No acute or significant osseous findings. Review of the MIP images confirms the above findings. IMPRESSION: CTA of the chest: No evidence of pulmonary emboli. No acute abnormality noted. CT of the abdomen and pelvis: Hyperemia in the stomach consistent with the patient's known history of gastric carcinoma. Soft tissue mass in the left upper quadrant stable from the prior PET-CT. Electronically Signed   By: Violeta Grey M.D.   On: 10/10/2023 03:25

## 2023-10-16 NOTE — Patient Instructions (Signed)
 Take Promethazine  50mg  (2 pills) at night for nausea.  Take Reglan  ONLY IF NEEDED for nausea according to pharmacy instructions  [facility] - A DEPT OF [place]  Discharge Instructions: Thank you for choosing [facility] to provide your oncology and hematology care.   If you have a lab appointment with the Cancer Center, please go directly to the Cancer Center and check in at the registration area.   Wear comfortable clothing and clothing appropriate for easy access to any Portacath or PICC line.   We strive to give you quality time with your provider. You may need to reschedule your appointment if you arrive late (15 or more minutes).  Arriving late affects you and other patients whose appointments are after yours.  Also, if you miss three or more appointments without notifying the office, you may be dismissed from the clinic at the provider's discretion.      For prescription refill requests, have your pharmacy contact our office and allow 72 hours for refills to be completed.    Today you received the following chemotherapy and/or immunotherapy agents vyloy , fluorourcil, leucovorin       To help prevent nausea and vomiting after your treatment, we encourage you to take your nausea medication as directed.  BELOW ARE SYMPTOMS THAT SHOULD BE REPORTED IMMEDIATELY: *FEVER GREATER THAN 100.4 F (38 C) OR HIGHER *CHILLS OR SWEATING *NAUSEA AND VOMITING THAT IS NOT CONTROLLED WITH YOUR NAUSEA MEDICATION *UNUSUAL SHORTNESS OF BREATH *UNUSUAL BRUISING OR BLEEDING *URINARY PROBLEMS (pain or burning when urinating, or frequent urination) *BOWEL PROBLEMS (unusual diarrhea, constipation, pain near the anus) TENDERNESS IN MOUTH AND THROAT WITH OR WITHOUT PRESENCE OF ULCERS (sore throat, sores in mouth, or a toothache) UNUSUAL RASH, SWELLING OR PAIN  UNUSUAL VAGINAL DISCHARGE OR ITCHING   Items with * indicate a potential emergency and should be  followed up as soon as possible or go to the Emergency Department if any problems should occur.  Please show the CHEMOTHERAPY ALERT CARD or IMMUNOTHERAPY ALERT CARD at check-in to the Emergency Department and triage nurse.  Should you have questions after your visit or need to cancel or reschedule your appointment, please contact [facility] - A DEPT OF [facility]  Dept: [phone]  and follow the prompts.  Office hours are 8:00 a.m. to 4:30 p.m. Monday - Friday. Please note that voicemails left after 4:00 p.m. may not be returned until the following business day.  We are closed weekends and major holidays. You have access to a nurse at all times for urgent questions. Please call the main number to the clinic Dept: [phone] and follow the prompts.   For any non-urgent questions, you may also contact your provider using MyChart. We now offer e-Visits for anyone 32 and older to request care online for non-urgent symptoms. For details visit [email or web address].   Also download the MyChart app! Go to the app store, search MyChart, open the app, select [facility], and log in with your MyChart username and password.

## 2023-10-16 NOTE — Progress Notes (Signed)
 Received notification stating Quantity not sufficient - Request for Additional Tissue for case#[id number] (B1).  Sent email on 10/16/2023 to [facility], [address], Cydney Cunningham, Tonya Pendell, Dr. Maryalice Smaller, Lacie Burton, and Darlina Einstein with [employer].    [place], [place], or Cydney:  Our office received notification that the surgical tissue submitted for case# [id number] (B1) was insufficient.  Is there another block/tissue sample you have that can be sent from this case?  If not, I will have [employer] reach out to [facility] for case#[id number] done 03/30/2022.  This patient also had a recent surgical biopsy done on 03/21/2023 case#[id number] if the surgical tissue sample looks sufficient enough to run this test.   Darlina Einstein:  I have attached both pathology reports from the other two surgical biopsies for future reference if those samples are needed.  Let's see what [place], Tammy, or Cydney say in our Pathology Department regarding additional blocks for case#[id number].    Waiting to hear back our Pathology Department regarding tissue samples.

## 2023-10-16 NOTE — Addendum Note (Signed)
 Addended by: Rande Bushy on: 10/16/2023 11:06 AM   Modules accepted: Orders

## 2023-10-17 ENCOUNTER — Encounter: Payer: Self-pay | Admitting: Hematology

## 2023-10-18 ENCOUNTER — Inpatient Hospital Stay

## 2023-10-18 VITALS — HR 80 | Temp 98.7°F | Resp 16

## 2023-10-18 DIAGNOSIS — Z5112 Encounter for antineoplastic immunotherapy: Secondary | ICD-10-CM | POA: Diagnosis not present

## 2023-10-18 DIAGNOSIS — Z95828 Presence of other vascular implants and grafts: Secondary | ICD-10-CM

## 2023-10-18 DIAGNOSIS — C162 Malignant neoplasm of body of stomach: Secondary | ICD-10-CM

## 2023-10-18 MED ORDER — SODIUM CHLORIDE 0.9% FLUSH
10.0000 mL | Freq: Once | INTRAVENOUS | Status: AC
  Administered 2023-10-18: 10 mL

## 2023-10-18 MED ORDER — HEPARIN SOD (PORK) LOCK FLUSH 100 UNIT/ML IV SOLN
500.0000 [IU] | Freq: Once | INTRAVENOUS | Status: AC
  Administered 2023-10-18: 500 [IU]

## 2023-10-25 ENCOUNTER — Ambulatory Visit (HOSPITAL_COMMUNITY): Admission: RE | Admit: 2023-10-25 | Source: Ambulatory Visit

## 2023-10-28 ENCOUNTER — Other Ambulatory Visit (HOSPITAL_COMMUNITY): Payer: Self-pay

## 2023-10-28 ENCOUNTER — Other Ambulatory Visit: Payer: Self-pay | Admitting: Gastroenterology

## 2023-10-28 MED FILL — Fosaprepitant Dimeglumine For IV Infusion 150 MG (Base Eq): INTRAVENOUS | Qty: 5 | Status: AC

## 2023-10-28 NOTE — Assessment & Plan Note (Signed)
 [id number] with peritoneal metastasis. 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   -she started first line chemo FLOT on 1/10 -She understands that chemotherapy is palliative, to prolong her life.  We are unlikely going to cure her cancer. -PD-L1 0-1%, no significant benefit from PD-L1 immunotherapy, FO revealed FGFR2 amplification and fusion (+), FGFR inhibitors can be considered in future, no other targeted therapy available  -She has been tolerating chemo very well, will continue for now  -PET scan from 05/30/2022 was negative for primary tumor or metastatic disease, the known peritoneal mets did not show on PET.  -she has been tolerating chemo well overall. Due to fatigue, I have changed her chemo from FLOT to FOLFOX on 06/20/2022, she tolerated well -she previously asked the role of surgery, depends on her next restaging CT scan findings, I may refer her to [facility] or [facility] to discuss HIPEC surgery  -due to her infusion reaction to oxaliplatin  on C5, we added additional premeds and gave slow infusion over 4 hours for cycle 6 and she tolerated well  -She is not able to return to work due to the cancer and treatment related symptoms.  -She is tolerating FOLFOX well overall, with moderate fatigue for a few days after infusion but able to recover well.  No signs of neuropathy at this point. -Restaging CT abdomen pelvis from August 27, 2022 showed no residual disease.  -We again discussed maintenance therapy with Xeloda  down the road, we will stop oxaliplatin  when she develops side effects especially neuropathy, or after next scan -repeated staging CT from 11/26/2021 showed stable disease  -I have changed her treatment to maintenance Xeloda  in early August 2024, she is tolerating well overall  -her NGS Caris showed positive Claudin 18.2, she is a  candidate for zolbetuximab.  -Repeated EGD on March 21, 2023 showed residual gastric cancer.  We discussed option of changing her chemotherapy back to FOLFOX and add zolbetuximab.  -PET 06/03/2023 showed stable disease (no hypermetabolic disease outside stomach) -Patient developed recurrent abdominal pain, similar to the symptoms she had when she was diagnosed.  She agreed to change treatment back to FOLFOX, and add Zolbetuximab. She started on 07/09/2023. She tolerated first cycle poorly and had prolonged recovery. Oxaliplatin  was stop after cycle 1. She continued 5-fu/LV and zolbe every 2 weeks  -CT 10/10/2023 showed no evidence of disease

## 2023-10-29 ENCOUNTER — Inpatient Hospital Stay (HOSPITAL_BASED_OUTPATIENT_CLINIC_OR_DEPARTMENT_OTHER): Admitting: Hematology

## 2023-10-29 ENCOUNTER — Inpatient Hospital Stay

## 2023-10-29 ENCOUNTER — Inpatient Hospital Stay: Attending: Physician Assistant

## 2023-10-29 ENCOUNTER — Other Ambulatory Visit (HOSPITAL_COMMUNITY): Payer: Self-pay

## 2023-10-29 ENCOUNTER — Inpatient Hospital Stay: Admitting: Dietician

## 2023-10-29 VITALS — BP 111/71 | HR 86

## 2023-10-29 VITALS — BP 110/62 | HR 98 | Temp 97.1°F | Resp 16 | Ht 68.0 in | Wt 175.4 lb

## 2023-10-29 DIAGNOSIS — C162 Malignant neoplasm of body of stomach: Secondary | ICD-10-CM | POA: Diagnosis not present

## 2023-10-29 DIAGNOSIS — Z5112 Encounter for antineoplastic immunotherapy: Secondary | ICD-10-CM | POA: Insufficient documentation

## 2023-10-29 DIAGNOSIS — C786 Secondary malignant neoplasm of retroperitoneum and peritoneum: Secondary | ICD-10-CM | POA: Insufficient documentation

## 2023-10-29 DIAGNOSIS — Z79899 Other long term (current) drug therapy: Secondary | ICD-10-CM | POA: Diagnosis not present

## 2023-10-29 DIAGNOSIS — C169 Malignant neoplasm of stomach, unspecified: Secondary | ICD-10-CM | POA: Insufficient documentation

## 2023-10-29 DIAGNOSIS — R112 Nausea with vomiting, unspecified: Secondary | ICD-10-CM | POA: Insufficient documentation

## 2023-10-29 DIAGNOSIS — R634 Abnormal weight loss: Secondary | ICD-10-CM | POA: Diagnosis not present

## 2023-10-29 DIAGNOSIS — Z95828 Presence of other vascular implants and grafts: Secondary | ICD-10-CM

## 2023-10-29 DIAGNOSIS — Z5111 Encounter for antineoplastic chemotherapy: Secondary | ICD-10-CM | POA: Insufficient documentation

## 2023-10-29 LAB — CBC WITH DIFFERENTIAL (CANCER CENTER ONLY)
Abs Immature Granulocytes: 0 10*3/uL (ref 0.00–0.07)
Basophils Absolute: 0 10*3/uL (ref 0.0–0.1)
Basophils Relative: 1 %
Eosinophils Absolute: 0.3 10*3/uL (ref 0.0–0.5)
Eosinophils Relative: 11 %
HCT: 37.7 % (ref 36.0–46.0)
Hemoglobin: 13.2 g/dL (ref 12.0–15.0)
Immature Granulocytes: 0 %
Lymphocytes Relative: 29 %
Lymphs Abs: 0.7 10*3/uL (ref 0.7–4.0)
MCH: 29.5 pg (ref 26.0–34.0)
MCHC: 35 g/dL (ref 30.0–36.0)
MCV: 84.3 fL (ref 80.0–100.0)
Monocytes Absolute: 0.3 10*3/uL (ref 0.1–1.0)
Monocytes Relative: 11 %
Neutro Abs: 1.2 10*3/uL — ABNORMAL LOW (ref 1.7–7.7)
Neutrophils Relative %: 48 %
Platelet Count: 276 10*3/uL (ref 150–400)
RBC: 4.47 MIL/uL (ref 3.87–5.11)
RDW: 14.1 % (ref 11.5–15.5)
WBC Count: 2.5 10*3/uL — ABNORMAL LOW (ref 4.0–10.5)
nRBC: 0 % (ref 0.0–0.2)

## 2023-10-29 LAB — CMP (CANCER CENTER ONLY)
ALT: 15 U/L (ref 0–44)
AST: 21 U/L (ref 15–41)
Albumin: 3.6 g/dL (ref 3.5–5.0)
Alkaline Phosphatase: 94 U/L (ref 38–126)
Anion gap: 5 (ref 5–15)
BUN: 8 mg/dL (ref 6–20)
CO2: 28 mmol/L (ref 22–32)
Calcium: 8.7 mg/dL — ABNORMAL LOW (ref 8.9–10.3)
Chloride: 104 mmol/L (ref 98–111)
Creatinine: 0.63 mg/dL (ref 0.44–1.00)
GFR, Estimated: >60 mL/min (ref >60)
Glucose, Bld: 135 mg/dL — ABNORMAL HIGH (ref 70–99)
Potassium: 3.7 mmol/L (ref 3.5–5.1)
Sodium: 137 mmol/L (ref 135–145)
Total Bilirubin: 0.4 mg/dL (ref 0.0–1.2)
Total Protein: 6 g/dL — ABNORMAL LOW (ref 6.5–8.1)

## 2023-10-29 MED ORDER — SODIUM CHLORIDE 0.9% FLUSH
10.0000 mL | Freq: Once | INTRAVENOUS | Status: AC
  Administered 2023-10-29: 10 mL

## 2023-10-29 MED ORDER — ZOLBETUXIMAB-CLZB CHEMO 100MG/5ML IV SOLN
700.0000 mg | Freq: Once | INTRAVENOUS | Status: AC
  Administered 2023-10-29: 700 mg via INTRAVENOUS
  Filled 2023-10-29: qty 30

## 2023-10-29 MED ORDER — PROCHLORPERAZINE MALEATE 10 MG PO TABS
5.0000 mg | ORAL_TABLET | Freq: Four times a day (QID) | ORAL | Status: DC | PRN
  Administered 2023-10-29: 5 mg via ORAL
  Filled 2023-10-29: qty 1

## 2023-10-29 MED ORDER — SODIUM CHLORIDE 0.9 % IV SOLN
2100.0000 mg/m2 | INTRAVENOUS | Status: DC
  Administered 2023-10-29: 4450 mg via INTRAVENOUS
  Filled 2023-10-29: qty 89

## 2023-10-29 MED ORDER — SODIUM CHLORIDE 0.9 % IV SOLN
400.0000 mg/m2 | Freq: Once | INTRAVENOUS | Status: AC
  Administered 2023-10-29: 844 mg via INTRAVENOUS
  Filled 2023-10-29: qty 42.2

## 2023-10-29 MED ORDER — FAMOTIDINE IN NACL 20-0.9 MG/50ML-% IV SOLN
20.0000 mg | Freq: Once | INTRAVENOUS | Status: AC
  Administered 2023-10-29: 20 mg via INTRAVENOUS
  Filled 2023-10-29: qty 50

## 2023-10-29 MED ORDER — PALONOSETRON HCL INJECTION 0.25 MG/5ML
0.2500 mg | Freq: Once | INTRAVENOUS | Status: AC
  Administered 2023-10-29: 0.25 mg via INTRAVENOUS
  Filled 2023-10-29: qty 5

## 2023-10-29 MED ORDER — SODIUM CHLORIDE 0.9% FLUSH: 10.0000 mL | INTRAVENOUS | Status: DC | PRN

## 2023-10-29 MED ORDER — DIPHENHYDRAMINE HCL 25 MG PO CAPS
25.0000 mg | ORAL_CAPSULE | Freq: Once | ORAL | Status: AC
  Administered 2023-10-29: 25 mg via ORAL
  Filled 2023-10-29: qty 1

## 2023-10-29 MED ORDER — SODIUM CHLORIDE 0.9 % IV SOLN: INTRAVENOUS | Status: DC

## 2023-10-29 MED ORDER — DEXAMETHASONE SODIUM PHOSPHATE 10 MG/ML IJ SOLN
10.0000 mg | Freq: Once | INTRAMUSCULAR | Status: AC
  Administered 2023-10-29: 10 mg via INTRAVENOUS
  Filled 2023-10-29: qty 1

## 2023-10-29 MED ORDER — LORAZEPAM 2 MG/ML IJ SOLN
0.5000 mg | Freq: Once | INTRAMUSCULAR | Status: AC | PRN
  Administered 2023-10-29: 0.5 mg via INTRAVENOUS
  Filled 2023-10-29: qty 1

## 2023-10-29 MED ORDER — HEPARIN SOD (PORK) LOCK FLUSH 100 UNIT/ML IV SOLN: 500.0000 [IU] | Freq: Once | INTRAVENOUS | Status: DC | PRN

## 2023-10-29 MED ORDER — OLANZAPINE 5 MG PO TABS
5.0000 mg | ORAL_TABLET | Freq: Once | ORAL | Status: AC
  Administered 2023-10-29: 5 mg via ORAL
  Filled 2023-10-29: qty 1

## 2023-10-29 MED ORDER — SUCRALFATE 1 G PO TABS
1.0000 g | ORAL_TABLET | Freq: Two times a day (BID) | ORAL | 6 refills | Status: DC
  Filled 2023-10-29: qty 60, 30d supply, fill #0
  Filled 2023-12-09: qty 60, 30d supply, fill #1
  Filled 2024-01-27: qty 60, 30d supply, fill #2
  Filled 2024-02-23: qty 60, 30d supply, fill #3

## 2023-10-29 MED ORDER — SODIUM CHLORIDE 0.9 % IV SOLN
150.0000 mg | Freq: Once | INTRAVENOUS | Status: AC
  Administered 2023-10-29: 150 mg via INTRAVENOUS
  Filled 2023-10-29: qty 150

## 2023-10-29 NOTE — Progress Notes (Signed)
 Nutrition Follow-up:  Patient with gastric cancer. She is currently receiving reduced dose Zolbetuximab/5FU + Leucovorin  q42d (start 07/09/23)   6/11 - seen in ED for intractable N/V  Met with pt in infusion. Father of pt present at visit today. Pt going to ED secondary to 4 days of N/V despite antiemetics. Pt was unable to keep anything down. This has resolved. Pt has not had any episodes of vomiting in the last 4 days. She is taking reglan  BID as well as phenergan  at bedtime. Continues to have occasional vomiting at night, however reports noticeable improvement to number of episodes. She has started drinking Glucerna. Patient likes the chocolate flavor. Reports bowel movement every 4 days or so. She is not on bowel regimen. Pt denies abdominal discomfort, hard stool, straining.    Medications: reviewed   Labs: glucose 135  Anthropometrics: Wt 175 lb 6.4 oz today decreased 4% in 4 weeks - significant   6/18 - 179 lb 4.8 oz  6/4 - 183 lb 6.4 oz    NUTRITION DIAGNOSIS: Unintended wt loss - ongoing   INTERVENTION:  Suggested eating largest meal in middle of day with small light dinners given ongoing overnight vomiting episodes Continue antiemetics + reglan  Suggested resuming bowel regimen to increase number of weekly Bms Pt agreeable to keep food journal x2 weeks - RD to review at follow-up Continue daily Glucerna - samples + coupons    MONITORING, EVALUATION, GOAL: wt trends, intake   NEXT VISIT: Wednesday July 16 during infusion.

## 2023-10-29 NOTE — Progress Notes (Signed)
 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   -Sheryl [name] started first line chemo FLOT on 1/10 -Sheryl [name] understands that chemotherapy is palliative, to prolong her life.  We are unlikely going to cure her cancer. -PD-L1 0-1%, no significant benefit from PD-L1 immunotherapy, FO revealed FGFR2 amplification and fusion (+), FGFR inhibitors can be considered in future, no other targeted therapy available  -Sheryl [name] has been tolerating chemo very well, will continue for now  -PET scan from 05/30/2022 was negative for primary tumor or metastatic disease, the known peritoneal mets did not show on PET.  -Sheryl [name] has been tolerating chemo well overall. Due to fatigue, I have changed her chemo from FLOT to FOLFOX on 06/20/2022, Sheryl [name] tolerated well -Sheryl [name] previously asked the role of surgery, depends on her next restaging CT scan findings, I may refer her to [facility] or [facility] to discuss HIPEC surgery  -due to her infusion reaction to oxaliplatin  on C5, we added additional premeds and gave slow infusion over 4 hours for cycle 6 and Sheryl [name] tolerated well  -Sheryl [name] is not able to return to work due to the cancer and treatment related symptoms.  -Sheryl [name] is tolerating FOLFOX well overall, with moderate fatigue for a few days after infusion but able to recover well.  No signs of neuropathy at this point. -Restaging CT abdomen pelvis from August 27, 2022 showed no  residual disease.  -We again discussed maintenance therapy with Xeloda  down the road, we will stop oxaliplatin  when Sheryl [name] develops side effects especially neuropathy, or after next scan -repeated staging CT from 11/26/2021 showed stable disease  -I have changed her treatment to maintenance Xeloda  in early August 2024, Sheryl [name] is tolerating well overall  -her NGS Caris showed positive Claudin 18.2, Sheryl [name] is a candidate for zolbetuximab.  -Repeated EGD on March 21, 2023 showed residual gastric cancer.  We discussed option of changing her chemotherapy back to FOLFOX and add zolbetuximab.  -PET 06/03/2023 showed stable disease (no hypermetabolic disease outside stomach) -Patient developed recurrent abdominal pain, similar to the symptoms Sheryl [name] had when Sheryl [name] was diagnosed.  Sheryl [name] agreed to change treatment back to FOLFOX, and add Zolbetuximab. Sheryl [name] started on 07/09/2023. Sheryl [name] tolerated first cycle poorly and had prolonged recovery. Oxaliplatin  was stop after cycle 1. Sheryl [name] continued 5-fu/LV and zolbe every 2 weeks  -CT 10/10/2023 showed no evidence of disease   Assessment & Plan Gastric cancer with peritoneal metastasis Gastric cancer with peritoneal metastasis, currently well-managed with no new metastasis on recent CT scan. Stomach wall thickening is present, etiology unclear between cancer and inflammation from nausea and vomiting. Peritoneal metastasis is small and stable. Tumor markers are not elevated, and disease is not measurable on imaging. Signatera test was not feasible due to insufficient biopsy tissue. Treatment is tolerable, but monitoring is challenging due to lack of measurable disease markers. - Continue current chemotherapy regimen if tolerable. - Plan PET scan for mid-August to assess disease status. - Investigate using November biopsy for Signatera  testing. - Consider endoscopy if symptoms worsen or there is concern for disease progression.  Vomiting without nausea Persistent vomiting without nausea,  primarily in the first week to ten days post-chemotherapy. Vomiting is not alleviated by anti-nausea medications and consists mainly of mucus and saliva. Associated with abdominal pain. - Continue current anti-nausea medications despite limited efficacy. - Schedule IV fluids on the day of pump disconnection for each chemotherapy cycle to manage hydration.  Abdominal pain Intermittent abdominal pain associated with vomiting. Pain is sometimes alleviated by positioning and pain medication. - Continue current pain management regimen as needed. - Monitor for changes in pain pattern or severity.  Weight loss Weight loss of four pounds since last visit, likely related to chemotherapy and associated symptoms. Sheryl [name] is consuming nutritional supplements and maintaining a high-calorie, high-protein diet. - Encourage continued use of nutritional supplements and high-calorie, high-protein diet. - Consult with dietitian during infusion appointment for additional dietary support.  Plan - I reviewed her recent CT scan from October 10, 2023 - Lab reviewed, adequate for treatment, will continue current therapy at the same dose - Plan to repeat a PET scan in 5 to 6 weeks - Follow-up in 2 weeks   SUMMARY OF ONCOLOGIC HISTORY: Oncology History Overview Note   Cancer Staging  Gastric cancer [facility]) Staging form: Stomach, AJCC 8th Edition - Clinical stage from 04/19/2022: Stage IVB (cT2, cN0, pM1) - Signed by [name], MD on 05/08/2022 Total positive nodes: 0     Gastric cancer (HCC)  03/30/2022 Procedure   EGD:  Impression:  - Normal esophagus. - A few gastric polyps. Biopsied. - Gastritis. Biopsied. - Non-bleeding gastric ulcer with no stigmata of bleeding. Biopsied. - Normal examined duodenum. Biopsied.  Findings: Diffuse moderate inflammation characterized by congestion (edema), friability and granularity was found in the cardia, in the gastric fundus and in the gastric body. There were associated  erosions in multiple places. Biopsies were taken from the antrum, body, and fundus with a cold forceps for histology. Estimated blood loss was minimal.  One non-bleeding cratered gastric ulcer with no stigmata of bleeding was found on the greater curvature of the stomach. The lesion was 6 mm in largest dimension. The mucosa around the ulcer was heaped and led to some deformity in the antrum. Biopsies were taken with a cold forceps for histology. Estimated blood loss was minimal.    03/30/2022 Pathology Results   Patient: Dibari, Sidnee P  Accession: [id number]  Diagnosis 1. Surgical [P], duodenal - BENIGN SMALL BOWEL MUCOSA WITH NO SIGNIFICANT PATHOLOGIC CHANGES 2. Surgical [P], gastric antrum - GASTRIC ANTRAL MUCOSA WITH FEATURES OF REACTIVE GASTROPATHY - NEGATIVE FOR H. PYLORI ON H&E STAIN - NEGATIVE FOR INTESTINAL METAPLASIA OR MALIGNANCY 3. Surgical [P], gastric body - GASTRIC OXYNTIC MUCOSA WITH REACTIVE/REPARATIVE CHANGES - NEGATIVE FOR H. PYLORI ON H&E STAIN - NEGATIVE FOR INTESTINAL METAPLASIA, DYSPLASIA OR MALIGNANCY 4. Surgical [P], greater curve ulceration - ADENOCARCINOMA WITH SIGNET RING CELL FEATURES (SEE NOTE) 5. Surgical [P], gastric polyps - ADENOCARCINOMA WITH SIGNET RING CELL FEATURES (SEE NOTE) 6. Surgical [P], fundus (gastric) - ADENOCARCINOMA WITH SIGNET RING CELL FEATURES (SEE NOTE) 7. Surgical [P], colon, ascending, polyp (1) - TUBULAR ADENOMA. - NO HIGH GRADE DYSPLASIA OR MALIGNANCY. 8. Surgical [P], colon, transverse, polyp (1) - TUBULAR ADENOMA. - NO HIGH GRADE DYSPLASIA OR MALIGNANCY.    04/13/2022 Initial Diagnosis   Gastric cancer (HCC)   04/19/2022 Cancer Staging   Staging form: Stomach, AJCC 8th Edition - Clinical stage from 04/19/2022: Stage  IVB (cT2, cN0, pM1) - Signed by [name], MD on 05/08/2022 Total positive nodes: 0   05/05/2022 Genetic Testing   Negative genetic testing on the Multi-cancer gene panel + RNA.  FH c.259C>T VUS  identified.  The report date is May 05, 2022.  The Multi-Cancer + RNA Panel offered by [facility] includes sequencing and/or deletion/duplication analysis of the following 70 genes:  AIP*, ALK, APC*, ATM*, AXIN2*, BAP1*, BARD1*, BLM*, BMPR1A*, BRCA1*, BRCA2*, BRIP1*, CDC73*, CDH1*, CDK4, CDKN1B*, CDKN2A, CHEK2*, CTNNA1*, DICER1*, EPCAM (del/dup only), EGFR, FH*, FLCN*, GREM1 (promoter dup only), HOXB13, KIT, LZTR1, MAX*, MBD4, MEN1*, MET, MITF, MLH1*, MSH2*, MSH3*, MSH6*, MUTYH*, NF1*, NF2*, NTHL1*, PALB2*, PDGFRA, PMS2*, POLD1*, POLE*, POT1*, PRKAR1A*, PTCH1*, PTEN*, RAD51C*, RAD51D*, RB1*, RET, SDHA* (sequencing only), SDHAF2*, SDHB*, SDHC*, SDHD*, SMAD4*, SMARCA4*, SMARCB1*, SMARCE1*, STK11*, SUFU*, TMEM127*, TP53*, TSC1*, TSC2*, VHL*. RNA analysis is performed for * genes.    05/09/2022 - 06/07/2022 Chemotherapy   Patient is on Treatment Plan : GASTROESOPHAGEAL FLOT q14d X 4 cycles      Miscellaneous   Foundation One  Biomarker Findings Microsatellite status- Cannot be determined Tumor Mutational Burden- Cannot be determined  Genomic Findings  FGFR2 amplification,FGFR2-TACC2 fusion,  Rearrangement intron 17 ARAF amplification CCND3 amplification TP53 V239fs*74     05/30/2022 Imaging    IMPRESSION: 1. Mild hypermetabolism corresponding to a dominant left upper quadrant mass and smaller perigastric nodules or nodes. 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 Recommend attention to the dominant left upper quadrant soft tissue mass on follow-up exams to exclude unlikely recurrent lymphoma. 2. No gastric hypermetabolism and no typical findings of metastatic disease.   06/20/2022 - 11/16/2022 Chemotherapy   Patient is on Treatment Plan : GASTRIC FOLFOX q14d x 12 cycles     08/27/2022 Imaging    IMPRESSION: No focal gastric mass on CT.   No findings suspicious for recurrent or metastatic disease.   Stable left upper abdominal soft tissue lesion and  small lymph nodes, chronic, favoring treated lymphoma.   11/27/2022 Imaging    IMPRESSION: 1. Questionable thickening of the distal esophagus/GE junction and gastric antrum, consider further evaluation with endoscopy. 2. Chronically stable left upper quadrant nodularity and prominent lymph nodes again favored treated lymphoma. Continued attention on follow-up imaging suggested. 3. No convincing evidence of metastatic disease in the chest, abdomen or pelvis. 4. Questionable asymmetric wall thickening of the rectum, consider further evaluation with colonoscopy. 5. Mild wall thickening of a nondistended urinary bladder, correlate with urinalysis to exclude cystitis. 6. Hepatic steatosis.   07/10/2023 -  Chemotherapy   Patient is on Treatment Plan : GASTROESOPHAGEAL Zolbetuximab (800/400) + FOLFOX D1,15,29 q42d x 4 cycles / Zolbetuximab (400) + 5FU + Leucovorin  D1,15,29 q42d        Discussed the use of AI scribe software for clinical note transcription with the patient, who gave verbal consent to proceed.  History of Present Illness Sheryl [name] is a 58 year old female with gastric cancer who presents for follow-up.  Sheryl [name] experiences ongoing weight loss, having lost four pounds since her last visit, despite maintaining her appetite and consuming two bottles of nutritional supplements daily. Vomiting occurs once daily, particularly at night, for about a week following chemotherapy sessions. This pattern has been consistent since her diagnosis. Intermittent abdominal pain is associated with vomiting, localized to the stomach area, and sometimes accompanied by a sensation of a 'knot'.  Her current medications include Sucrofit, which needs refilling, and potassium supplements taken twice  daily. Nausea medications, including patches and oral medications like Compazine  or Phenergan , do not alleviate her vomiting, which primarily consists of saliva and mucus. Bowel movements are regular, and  Sheryl [name] uses pain medication as needed. A recent episode of pain was relieved by taking pain medication and finding a comfortable position.     All other systems were reviewed with the patient and are negative.  MEDICAL HISTORY:  Past Medical History:  Diagnosis Date   Blood transfusion without reported diagnosis    had transfusion with hysterectomy   Cataract    Colon polyps 2012   Diabetes (HCC) 03/13/2021   Diabetes (HCC) 05/21/2019   Family history of breast cancer    Family history of pancreatic cancer    Family history of stomach cancer    Fibroid    gastric ca 03/2022   GERD (gastroesophageal reflux disease)    H/O blood clots    History of hysterectomy    fibroids and heavy cycles   Hypertension     SURGICAL HISTORY: Past Surgical History:  Procedure Laterality Date   ABDOMINAL HYSTERECTOMY     BIOPSY  04/19/2022   Procedure: BIOPSY;  Surgeon: Wilhelmenia Aloha Raddle., MD;  Location: [facility];  Service: Gastroenterology;;   BIOPSY  03/21/2023   Procedure: BIOPSY;  Surgeon: Wilhelmenia Aloha Raddle., MD;  Location: [facility];  Service: Gastroenterology;;   COLONOSCOPY     ESOPHAGOGASTRODUODENOSCOPY (EGD) WITH PROPOFOL  N/A 04/19/2022   Procedure: ESOPHAGOGASTRODUODENOSCOPY (EGD) WITH PROPOFOL ;  Surgeon: Wilhelmenia Aloha Raddle., MD;  Location: [facility];  Service: Gastroenterology;  Laterality: N/A;   ESOPHAGOGASTRODUODENOSCOPY (EGD) WITH PROPOFOL  N/A 03/21/2023   Procedure: ESOPHAGOGASTRODUODENOSCOPY (EGD) WITH PROPOFOL ;  Surgeon: Wilhelmenia Aloha Raddle., MD;  Location: [facility];  Service: Gastroenterology;  Laterality: N/A;   EUS N/A 04/19/2022   Procedure: UPPER ENDOSCOPIC ULTRASOUND (EUS) RADIAL;  Surgeon: Wilhelmenia Aloha Raddle., MD;  Location: [facility];  Service: Gastroenterology;  Laterality: N/A;   EXCISION OF SKIN TAG  05/03/2022   Procedure: EXCISION OF CHEST WALL SKIN LESION;  Surgeon: Dasie Leonor CROME, MD;  Location: [facility];  Service: General;;    LAPAROSCOPY N/A 05/03/2022   Procedure: LAPAROSCOPY DIAGNOSTIC WITH PERITONEAL WASHINGS;  Surgeon: Dasie Leonor CROME, MD;  Location: [facility];  Service: General;  Laterality: N/A;   POLYPECTOMY  04/19/2022   Procedure: POLYPECTOMY;  Surgeon: Wilhelmenia Aloha Raddle., MD;  Location: [facility];  Service: Gastroenterology;;   PORTACATH PLACEMENT N/A 05/03/2022   Procedure: INSERTION PORT-A-CATH WITH ULTRASOUND GUIDANCE;  Surgeon: Dasie Leonor CROME, MD;  Location: [facility];  Service: General;  Laterality: N/A;   UPPER GASTROINTESTINAL ENDOSCOPY      I have reviewed the social history and family history with the patient and they are unchanged from previous note.  ALLERGIES:  is allergic to aspirin, cyclobenzaprine, naproxen sodium, zithromax [azithromycin dihydrate], oxaliplatin , and dilaudid [hydromorphone].  MEDICATIONS:  Current Outpatient Medications  Medication Sig Dispense Refill   acetaminophen  (TYLENOL ) 500 MG tablet Take 2 tablets (1,000 mg total) by mouth every 8 (eight) hours as needed (pain). 30 tablet 1   amLODipine  (NORVASC ) 10 MG tablet Take 1 tablet (10 mg total) by mouth daily. 30 tablet 2   b complex vitamins capsule Take 1 capsule by mouth daily.     calcium -vitamin D (OSCAL WITH D) 500-5 MG-MCG tablet Take 2 tablets by mouth 2 (two) times daily.     famotidine  (PEPCID ) 20 MG tablet Take 1 tablet (20 mg total) by mouth 2 (two) times daily. 60 tablet 2  HYDROcodone -acetaminophen  (NORCO/VICODIN) 5-325 MG tablet Take 1 tablet by mouth every 6 (six) hours as needed for moderate pain (pain score 4-6). 20 tablet 0   lidocaine -prilocaine  (EMLA ) cream Apply 1 Application topically as needed. 30 g 1   metoCLOPramide  (REGLAN ) 10 MG tablet Take 1 tablet (10 mg total) by mouth every 8 (eight) hours as needed for nausea. 60 tablet 1   pantoprazole  (PROTONIX ) 40 MG tablet Take 1 tablet (40 mg total) by mouth daily. 30 tablet 0   potassium chloride  SA (KLOR-CON  M) 20 MEQ tablet Take 2 tablets (40 mEq  total) by mouth 2 (two) times daily for 5 days, THEN 1 tablet (20 mEq total) 2 (two) times daily for 25 days. 80 tablet 0   prochlorperazine  (COMPAZINE ) 10 MG tablet Take 1 tablet (10 mg total) by mouth every 6 (six) hours as needed for nausea or vomiting. 30 tablet 2   promethazine  (PHENERGAN ) 50 MG tablet Take 1 tablet (50 mg total) by mouth 2 (two) times daily as needed for nausea or vomiting. 45 tablet 1   scopolamine  (TRANSDERM-SCOP) 1 MG/3DAYS Place 1 patch (1.5 mg total) onto the skin every 3 (three) days. 10 patch 1   sucralfate  (CARAFATE ) 1 g tablet Take 1 tablet (1 g total) by mouth 2 (two) times daily. 60 tablet 6   zinc gluconate 50 MG tablet Take 50 mg by mouth daily.     No current facility-administered medications for this visit.   Facility-Administered Medications Ordered in Other Visits  Medication Dose Route Frequency Provider Last Rate Last Admin   0.9 %  sodium chloride  infusion   Intravenous Continuous [name], MD 10 mL/hr at 10/29/23 1642 Infusion Verify at 10/29/23 1642   fluorouracil  (ADRUCIL ) 4,450 mg in sodium chloride  0.9 % 61 mL chemo infusion  2,100 mg/m2 (Treatment Plan Recorded) Intravenous 1 day or 1 dose [name], MD   Infusion Verify at 10/29/23 1642   heparin  lock flush 100 unit/mL  500 Units Intracatheter Once PRN [name], MD       prochlorperazine  (COMPAZINE ) tablet 5 mg  5 mg Oral Q6H PRN [name], MD   5 mg at 10/29/23 1112   sodium chloride  flush (NS) 0.9 % injection 10 mL  10 mL Intracatheter PRN [name], MD        PHYSICAL EXAMINATION: ECOG PERFORMANCE STATUS: 2 - Symptomatic, <50% confined to bed  Vitals:   10/29/23 0857  BP: 110/62  Pulse: 98  Resp: 16  Temp: (!) 97.1 F (36.2 C)  SpO2: 99%   Wt Readings from Last 3 Encounters:  10/29/23 175 lb 6.4 oz (79.6 kg)  10/16/23 179 lb 4.8 oz (81.3 kg)  10/02/23 183 lb 6.4 oz (83.2 kg)     GENERAL:alert, no distress and comfortable SKIN: skin color, texture, turgor are normal, no rashes  or significant lesions EYES: normal, Conjunctiva are pink and non-injected, sclera clear NECK: supple, thyroid  normal size, non-tender, without nodularity LYMPH:  no palpable lymphadenopathy in the cervical, axillary  LUNGS: clear to auscultation and percussion with normal breathing effort HEART: regular rate & rhythm and no murmurs and no lower extremity edema ABDOMEN:abdomen soft, non-tender and normal bowel sounds Musculoskeletal:no cyanosis of digits and no clubbing  NEURO: alert & oriented x 3 with fluent speech, no focal motor/sensory deficits  Physical Exam   LABORATORY DATA:  I have reviewed the data as listed    Latest Ref Rng & Units 10/29/2023    8:33 AM 10/16/2023  8:54 AM 10/10/2023   12:23 AM  CBC  WBC 4.0 - 10.5 K/uL 2.5  3.8  5.7   Hemoglobin 12.0 - 15.0 g/dL 86.7  86.4  86.3   Hematocrit 36.0 - 46.0 % 37.7  37.6  38.7   Platelets 150 - 400 K/uL 276  292  289         Latest Ref Rng & Units 10/29/2023    8:33 AM 10/16/2023    8:54 AM 10/10/2023   12:23 AM  CMP  Glucose 70 - 99 mg/dL 864  848  866   BUN 6 - 20 mg/dL 8  8  9    Creatinine 0.44 - 1.00 mg/dL 9.36  9.31  9.38   Sodium 135 - 145 mmol/L 137  138  134   Potassium 3.5 - 5.1 mmol/L 3.7  3.7  3.2   Chloride 98 - 111 mmol/L 104  102  100   CO2 22 - 32 mmol/L 28  29  24    Calcium  8.9 - 10.3 mg/dL 8.7  8.9  8.5   Total Protein 6.5 - 8.1 g/dL 6.0  6.2  5.7   Total Bilirubin 0.0 - 1.2 mg/dL 0.4  0.5  0.8   Alkaline Phos 38 - 126 U/L 94  97  77   AST 15 - 41 U/L 21  21  22    ALT 0 - 44 U/L 15  14  19        RADIOGRAPHIC STUDIES: I have personally reviewed the radiological images as listed and agreed with the findings in the report. No results found.    Orders Placed This Encounter  Procedures   CBC with Differential (Cancer Center Only)    Standing Status:   Future    Expected Date:   01/08/2024    Expiration Date:   01/07/2025   CMP (Cancer Center only)    Standing Status:   Future    Expected  Date:   01/08/2024    Expiration Date:   01/07/2025   CBC with Differential (Cancer Center Only)    Standing Status:   Future    Expected Date:   01/22/2024    Expiration Date:   01/21/2025   CMP (Cancer Center only)    Standing Status:   Future    Expected Date:   01/22/2024    Expiration Date:   01/21/2025   CBC with Differential (Cancer Center Only)    Standing Status:   Future    Expected Date:   02/05/2024    Expiration Date:   02/04/2025   CMP (Cancer Center only)    Standing Status:   Future    Expected Date:   02/05/2024    Expiration Date:   02/04/2025   CBC with Differential (Cancer Center Only)    Standing Status:   Future    Expected Date:   02/19/2024    Expiration Date:   02/18/2025   CMP (Cancer Center only)    Standing Status:   Future    Expected Date:   02/19/2024    Expiration Date:   02/18/2025   CBC with Differential (Cancer Center Only)    Standing Status:   Future    Expected Date:   03/04/2024    Expiration Date:   03/04/2025   CMP (Cancer Center only)    Standing Status:   Future    Expected Date:   03/04/2024    Expiration Date:   03/04/2025   CBC with Differential (Cancer  Center Only)    Standing Status:   Future    Expected Date:   03/18/2024    Expiration Date:   03/18/2025   CMP (Cancer Center only)    Standing Status:   Future    Expected Date:   03/18/2024    Expiration Date:   03/18/2025   All questions were answered. The patient knows to call the clinic with any problems, questions or concerns. No barriers to learning was detected. The total time spent in the appointment was 25 minutes, including review of chart and various tests results, discussions about plan of care and coordination of care plan     Onita Mattock, MD 10/29/2023

## 2023-10-29 NOTE — Progress Notes (Signed)
 Patient observed for 2 hours post Vyloy  infusion.  Pt tolerated treatment well without incident.  VSS at discharge.  Ambulated to lobby.

## 2023-10-29 NOTE — Patient Instructions (Signed)
 [facility] - A DEPT OF [facility]  Discharge Instructions: Thank you for choosing [facility] to provide your oncology and hematology care.   If you have a lab appointment with the Cancer Center, please go directly to the Cancer Center and check in at the registration area.   Wear comfortable clothing and clothing appropriate for easy access to any Portacath or PICC line.   We strive to give you quality time with your provider. You may need to reschedule your appointment if you arrive late (15 or more minutes).  Arriving late affects you and other patients whose appointments are after yours.  Also, if you miss three or more appointments without notifying the office, you may be dismissed from the clinic at the provider's discretion.      For prescription refill requests, have your pharmacy contact our office and allow 72 hours for refills to be completed.    Today you received the following chemotherapy and/or immunotherapy agents: Vyloy/Leucovorin /Fluorouracil       To help prevent nausea and vomiting after your treatment, we encourage you to take your nausea medication as directed.  BELOW ARE SYMPTOMS THAT SHOULD BE REPORTED IMMEDIATELY: *FEVER GREATER THAN 100.4 F (38 C) OR HIGHER *CHILLS OR SWEATING *NAUSEA AND VOMITING THAT IS NOT CONTROLLED WITH YOUR NAUSEA MEDICATION *UNUSUAL SHORTNESS OF BREATH *UNUSUAL BRUISING OR BLEEDING *URINARY PROBLEMS (pain or burning when urinating, or frequent urination) *BOWEL PROBLEMS (unusual diarrhea, constipation, pain near the anus) TENDERNESS IN MOUTH AND THROAT WITH OR WITHOUT PRESENCE OF ULCERS (sore throat, sores in mouth, or a toothache) UNUSUAL RASH, SWELLING OR PAIN  UNUSUAL VAGINAL DISCHARGE OR ITCHING   Items with * indicate a potential emergency and should be followed up as soon as possible or go to the Emergency Department if any problems should occur.  Please show the CHEMOTHERAPY ALERT  CARD or IMMUNOTHERAPY ALERT CARD at check-in to the Emergency Department and triage nurse.  Should you have questions after your visit or need to cancel or reschedule your appointment, please contact [facility] - A DEPT OF [facility]  Dept: [phone]  and follow the prompts.  Office hours are 8:00 a.m. to 4:30 p.m. Monday - Friday. Please note that voicemails left after 4:00 p.m. may not be returned until the following business day.  We are closed weekends and major holidays. You have access to a nurse at all times for urgent questions. Please call the main number to the clinic Dept: [phone] and follow the prompts.   For any non-urgent questions, you may also contact your provider using MyChart. We now offer e-Visits for anyone 61 and older to request care online for non-urgent symptoms. For details visit [email or web address].   Also download the MyChart app! Go to the app store, search "MyChart", open the app, select [place], and log in with your MyChart username and password.  The chemotherapy medication bag should finish at 46 hours, 96 hours, or 7 days. For example, if your pump is scheduled for 46 hours and it was put on at 4:00 p.m., it should finish at 2:00 p.m. the day it is scheduled to come off regardless of your appointment time.     Estimated time to finish at approximately 1:00 PM on 09/06/23.   If the display on your pump reads "Low Volume" and it is beeping, take the batteries out of the pump and come to the cancer  center for it to be taken off.   If the pump alarms go off prior to the pump reading "Low Volume" then call [phone] and someone can assist you.  If the plunger comes out and the chemotherapy medication is leaking out, please use your home chemo spill kit to clean up the spill. Do NOT use paper towels or other household products.  If you have problems or questions regarding your pump, please call either [phone]  (24 hours a day) or the cancer center Monday-Friday 8:00 a.m.- 4:30 p.m. at the clinic number and we will assist you. If you are unable to get assistance, then go to the nearest Emergency Department and ask the staff to contact the IV team for assistance.

## 2023-10-31 ENCOUNTER — Inpatient Hospital Stay

## 2023-10-31 ENCOUNTER — Other Ambulatory Visit (HOSPITAL_COMMUNITY): Payer: Self-pay

## 2023-10-31 VITALS — BP 126/89 | HR 78 | Temp 98.8°F | Resp 16

## 2023-10-31 DIAGNOSIS — C162 Malignant neoplasm of body of stomach: Secondary | ICD-10-CM

## 2023-10-31 DIAGNOSIS — Z5112 Encounter for antineoplastic immunotherapy: Secondary | ICD-10-CM | POA: Diagnosis not present

## 2023-10-31 MED ORDER — SODIUM CHLORIDE 0.9 % IV SOLN: INTRAVENOUS | Status: DC

## 2023-10-31 NOTE — Patient Instructions (Signed)

## 2023-11-04 ENCOUNTER — Other Ambulatory Visit: Payer: Self-pay | Admitting: Hematology

## 2023-11-05 ENCOUNTER — Other Ambulatory Visit (HOSPITAL_COMMUNITY): Payer: Self-pay

## 2023-11-05 ENCOUNTER — Other Ambulatory Visit: Payer: Self-pay

## 2023-11-05 MED ORDER — POTASSIUM CHLORIDE CRYS ER 20 MEQ PO TBCR
EXTENDED_RELEASE_TABLET | ORAL | 0 refills | Status: DC
  Filled 2023-11-05: qty 80, 35d supply, fill #0

## 2023-11-06 ENCOUNTER — Other Ambulatory Visit: Payer: Self-pay

## 2023-11-12 NOTE — Assessment & Plan Note (Signed)
 [id number] with peritoneal metastasis. 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   -she started first line chemo FLOT on 1/10 -She understands that chemotherapy is palliative, to prolong her life.  We are unlikely going to cure her cancer. -PD-L1 0-1%, no significant benefit from PD-L1 immunotherapy, FO revealed FGFR2 amplification and fusion (+), FGFR inhibitors can be considered in future, no other targeted therapy available  -She has been tolerating chemo very well, will continue for now  -PET scan from 05/30/2022 was negative for primary tumor or metastatic disease, the known peritoneal mets did not show on PET.  -she has been tolerating chemo well overall. Due to fatigue, I have changed her chemo from FLOT to FOLFOX on 06/20/2022, she tolerated well -she previously asked the role of surgery, depends on her next restaging CT scan findings, I may refer her to [facility] or [facility] to discuss HIPEC surgery  -due to her infusion reaction to oxaliplatin  on C5, we added additional premeds and gave slow infusion over 4 hours for cycle 6 and she tolerated well  -She is not able to return to work due to the cancer and treatment related symptoms.  -She is tolerating FOLFOX well overall, with moderate fatigue for a few days after infusion but able to recover well.  No signs of neuropathy at this point. -Restaging CT abdomen pelvis from August 27, 2022 showed no residual disease.  -We again discussed maintenance therapy with Xeloda  down the road, we will stop oxaliplatin  when she develops side effects especially neuropathy, or after next scan -repeated staging CT from 11/26/2021 showed stable disease  -I have changed her treatment to maintenance Xeloda  in early August 2024, she is tolerating well overall  -her NGS Caris showed positive Claudin 18.2, she is a  candidate for zolbetuximab.  -Repeated EGD on March 21, 2023 showed residual gastric cancer.  We discussed option of changing her chemotherapy back to FOLFOX and add zolbetuximab.  -PET 06/03/2023 showed stable disease (no hypermetabolic disease outside stomach) -Patient developed recurrent abdominal pain, similar to the symptoms she had when she was diagnosed.  She agreed to change treatment back to FOLFOX, and add Zolbetuximab. She started on 07/09/2023. She tolerated first cycle poorly and had prolonged recovery. Oxaliplatin  was stop after cycle 1. She continued 5-fu/LV and zolbe every 2 weeks  -CT 10/10/2023 showed no evidence of disease

## 2023-11-13 ENCOUNTER — Inpatient Hospital Stay

## 2023-11-13 ENCOUNTER — Inpatient Hospital Stay (HOSPITAL_BASED_OUTPATIENT_CLINIC_OR_DEPARTMENT_OTHER): Admitting: Hematology

## 2023-11-13 ENCOUNTER — Other Ambulatory Visit (HOSPITAL_COMMUNITY): Payer: Self-pay

## 2023-11-13 ENCOUNTER — Inpatient Hospital Stay: Admitting: Dietician

## 2023-11-13 VITALS — BP 118/76 | HR 99 | Temp 97.3°F | Resp 16 | Ht 68.0 in | Wt 172.0 lb

## 2023-11-13 DIAGNOSIS — Z95828 Presence of other vascular implants and grafts: Secondary | ICD-10-CM

## 2023-11-13 DIAGNOSIS — C162 Malignant neoplasm of body of stomach: Secondary | ICD-10-CM

## 2023-11-13 DIAGNOSIS — Z5112 Encounter for antineoplastic immunotherapy: Secondary | ICD-10-CM | POA: Diagnosis not present

## 2023-11-13 LAB — CMP (CANCER CENTER ONLY)
ALT: 14 U/L (ref 0–44)
AST: 23 U/L (ref 15–41)
Albumin: 3.6 g/dL (ref 3.5–5.0)
Alkaline Phosphatase: 83 U/L (ref 38–126)
Anion gap: 6 (ref 5–15)
BUN: 7 mg/dL (ref 6–20)
CO2: 34 mmol/L — ABNORMAL HIGH (ref 22–32)
Calcium: 9 mg/dL (ref 8.9–10.3)
Chloride: 99 mmol/L (ref 98–111)
Creatinine: 0.61 mg/dL (ref 0.44–1.00)
GFR, Estimated: >60 mL/min (ref >60)
Glucose, Bld: 123 mg/dL — ABNORMAL HIGH (ref 70–99)
Potassium: 2.9 mmol/L — ABNORMAL LOW (ref 3.5–5.1)
Sodium: 139 mmol/L (ref 135–145)
Total Bilirubin: 0.4 mg/dL (ref 0.0–1.2)
Total Protein: 6.3 g/dL — ABNORMAL LOW (ref 6.5–8.1)

## 2023-11-13 LAB — CBC WITH DIFFERENTIAL (CANCER CENTER ONLY)
Abs Immature Granulocytes: 0.01 K/uL (ref 0.00–0.07)
Basophils Absolute: 0.1 K/uL (ref 0.0–0.1)
Basophils Relative: 2 %
Eosinophils Absolute: 0.2 K/uL (ref 0.0–0.5)
Eosinophils Relative: 8 %
HCT: 37.9 % (ref 36.0–46.0)
Hemoglobin: 13.5 g/dL (ref 12.0–15.0)
Immature Granulocytes: 0 %
Lymphocytes Relative: 29 %
Lymphs Abs: 0.8 K/uL (ref 0.7–4.0)
MCH: 29.5 pg (ref 26.0–34.0)
MCHC: 35.6 g/dL (ref 30.0–36.0)
MCV: 82.9 fL (ref 80.0–100.0)
Monocytes Absolute: 0.6 K/uL (ref 0.1–1.0)
Monocytes Relative: 20 %
Neutro Abs: 1.1 K/uL — ABNORMAL LOW (ref 1.7–7.7)
Neutrophils Relative %: 41 %
Platelet Count: 308 K/uL (ref 150–400)
RBC: 4.57 MIL/uL (ref 3.87–5.11)
RDW: 14.3 % (ref 11.5–15.5)
WBC Count: 2.8 K/uL — ABNORMAL LOW (ref 4.0–10.5)
nRBC: 0 % (ref 0.0–0.2)

## 2023-11-13 MED ORDER — SODIUM CHLORIDE 0.9 % IV SOLN: INTRAVENOUS | Status: DC

## 2023-11-13 MED ORDER — SODIUM CHLORIDE 0.9% FLUSH: 10.0000 mL | INTRAVENOUS | Status: DC | PRN

## 2023-11-13 MED ORDER — PROCHLORPERAZINE MALEATE 10 MG PO TABS
5.0000 mg | ORAL_TABLET | Freq: Four times a day (QID) | ORAL | Status: DC | PRN
  Administered 2023-11-13: 5 mg via ORAL
  Filled 2023-11-13: qty 1

## 2023-11-13 MED ORDER — PALONOSETRON HCL INJECTION 0.25 MG/5ML
0.2500 mg | Freq: Once | INTRAVENOUS | Status: AC
  Administered 2023-11-13: 0.25 mg via INTRAVENOUS
  Filled 2023-11-13: qty 5

## 2023-11-13 MED ORDER — LORAZEPAM 2 MG/ML IJ SOLN
0.5000 mg | Freq: Once | INTRAMUSCULAR | Status: AC | PRN
  Administered 2023-11-13: 0.5 mg via INTRAVENOUS
  Filled 2023-11-13: qty 1

## 2023-11-13 MED ORDER — OLANZAPINE 5 MG PO TABS
5.0000 mg | ORAL_TABLET | Freq: Once | ORAL | Status: AC
  Administered 2023-11-13: 5 mg via ORAL
  Filled 2023-11-13: qty 1

## 2023-11-13 MED ORDER — PANTOPRAZOLE SODIUM 40 MG PO TBEC
40.0000 mg | DELAYED_RELEASE_TABLET | Freq: Every day | ORAL | 2 refills | Status: DC
  Filled 2023-11-13: qty 30, 30d supply, fill #0
  Filled 2023-12-09: qty 30, 30d supply, fill #1
  Filled 2024-01-10: qty 30, 30d supply, fill #2

## 2023-11-13 MED ORDER — SODIUM CHLORIDE 0.9 % IV SOLN
400.0000 mg/m2 | Freq: Once | INTRAVENOUS | Status: AC
  Administered 2023-11-13: 844 mg via INTRAVENOUS
  Filled 2023-11-13: qty 25

## 2023-11-13 MED ORDER — FAMOTIDINE IN NACL 20-0.9 MG/50ML-% IV SOLN
20.0000 mg | Freq: Once | INTRAVENOUS | Status: AC
  Administered 2023-11-13: 20 mg via INTRAVENOUS
  Filled 2023-11-13: qty 50

## 2023-11-13 MED ORDER — SODIUM CHLORIDE 0.9% FLUSH
10.0000 mL | Freq: Once | INTRAVENOUS | Status: AC
  Administered 2023-11-13: 10 mL

## 2023-11-13 MED ORDER — SODIUM CHLORIDE 0.9 % IV SOLN
2100.0000 mg/m2 | INTRAVENOUS | Status: AC
  Administered 2023-11-13: 4450 mg via INTRAVENOUS
  Filled 2023-11-13: qty 89

## 2023-11-13 MED ORDER — DEXAMETHASONE SODIUM PHOSPHATE 10 MG/ML IJ SOLN
10.0000 mg | Freq: Once | INTRAMUSCULAR | Status: AC
  Administered 2023-11-13: 10 mg via INTRAVENOUS
  Filled 2023-11-13: qty 1

## 2023-11-13 MED ORDER — FOSAPREPITANT DIMEGLUMINE INJECTION 150 MG
150.0000 mg | Freq: Once | INTRAVENOUS | Status: AC
  Administered 2023-11-13: 150 mg via INTRAVENOUS
  Filled 2023-11-13: qty 150

## 2023-11-13 MED ORDER — DIPHENHYDRAMINE HCL 25 MG PO CAPS
25.0000 mg | ORAL_CAPSULE | Freq: Once | ORAL | Status: AC
  Administered 2023-11-13: 25 mg via ORAL
  Filled 2023-11-13: qty 1

## 2023-11-13 MED ORDER — POTASSIUM CHLORIDE 10 MEQ/100ML IV SOLN
10.0000 meq | INTRAVENOUS | Status: AC
  Administered 2023-11-13 (×2): 10 meq via INTRAVENOUS
  Filled 2023-11-13 (×2): qty 100

## 2023-11-13 MED ORDER — ZOLBETUXIMAB-CLZB CHEMO 100MG/5ML IV SOLN
700.0000 mg | Freq: Once | INTRAVENOUS | Status: AC
  Administered 2023-11-13: 700 mg via INTRAVENOUS
  Filled 2023-11-13: qty 30

## 2023-11-13 NOTE — Progress Notes (Unsigned)
Per Dr. Burr Medico ok to treat with ANC of 1.1 today ?

## 2023-11-13 NOTE — Patient Instructions (Signed)
 [facility] - A DEPT OF [facility]  Discharge Instructions: Thank you for choosing [facility] to provide your oncology and hematology care.   If you have a lab appointment with the Cancer Center, please go directly to the Cancer Center and check in at the registration area.   Wear comfortable clothing and clothing appropriate for easy access to any Portacath or PICC line.   We strive to give you quality time with your provider. You may need to reschedule your appointment if you arrive late (15 or more minutes).  Arriving late affects you and other patients whose appointments are after yours.  Also, if you miss three or more appointments without notifying the office, you may be dismissed from the clinic at the provider's discretion.      For prescription refill requests, have your pharmacy contact our office and allow 72 hours for refills to be completed.    Today you received the following chemotherapy and/or immunotherapy agents Vyloy , Leucovorin , 5FU pump      To help prevent nausea and vomiting after your treatment, we encourage you to take your nausea medication as directed.  BELOW ARE SYMPTOMS THAT SHOULD BE REPORTED IMMEDIATELY: *FEVER GREATER THAN 100.4 F (38 C) OR HIGHER *CHILLS OR SWEATING *NAUSEA AND VOMITING THAT IS NOT CONTROLLED WITH YOUR NAUSEA MEDICATION *UNUSUAL SHORTNESS OF BREATH *UNUSUAL BRUISING OR BLEEDING *URINARY PROBLEMS (pain or burning when urinating, or frequent urination) *BOWEL PROBLEMS (unusual diarrhea, constipation, pain near the anus) TENDERNESS IN MOUTH AND THROAT WITH OR WITHOUT PRESENCE OF ULCERS (sore throat, sores in mouth, or a toothache) UNUSUAL RASH, SWELLING OR PAIN  UNUSUAL VAGINAL DISCHARGE OR ITCHING   Items with * indicate a potential emergency and should be followed up as soon as possible or go to the Emergency Department if any problems should occur.  Please show the CHEMOTHERAPY ALERT CARD  or IMMUNOTHERAPY ALERT CARD at check-in to the Emergency Department and triage nurse.  Should you have questions after your visit or need to cancel or reschedule your appointment, please contact [facility] - A DEPT OF [facility]  Dept: [phone]  and follow the prompts.  Office hours are 8:00 a.m. to 4:30 p.m. Monday - Friday. Please note that voicemails left after 4:00 p.m. may not be returned until the following business day.  We are closed weekends and major holidays. You have access to a nurse at all times for urgent questions. Please call the main number to the clinic Dept: [phone] and follow the prompts.   For any non-urgent questions, you may also contact your provider using MyChart. We now offer e-Visits for anyone 20 and older to request care online for non-urgent symptoms. For details visit [email or web address].   Also download the MyChart app! Go to the app store, search MyChart, open the app, select [facility], and log in with your MyChart username and password.

## 2023-11-13 NOTE — Progress Notes (Signed)
 Nutrition Follow-up:  Patient with gastric cancer. She is currently receiving reduced dose Zolbetuximab/5FU + Leucovorin  q42d (start 07/09/23)    6/11 - seen in ED for intractable N/V  Met with patient and father in infusion. She is taking in spoonfuls of peach juice at visit. She is not eating the peaches as she does not want to get sick. Patient provides 6 day dietary recall. Eating 3 small meals. Reports watermelon reported was for juice. She spit out the rest as she did not want to get full. The same noted for chicken noodle soup. Drinking broth only. There was 2 episodes of vomiting reported. One, after a single bite of smoked sausage. The other was after [employer] frozen fish filet prepared in air fryer. Reports tolerating this same fish the prior day. She is drinking 1-2 Glucerna. Patient reports overnight vomiting has resolved, however continues to wake up ~3AM dry heaving.    Medications: reviewed   Labs: K 2.9, glucose 123 (pt to resume po K+)  Anthropometrics: Wt 172 lb today decreased ~2% in 2 weeks - significant  7/1 - 175 lb 6.4 oz 6/18 - 179 lb 4.8 oz 6/4 - 183 lb 6.4 oz   NUTRITION DIAGNOSIS: Unintended wt loss - continues    INTERVENTION:  Encourage bites/sips q2-3 hours, recommend bland foods, avoiding heavily processed/fried/greasy foods Given stable glucose, suggested switching to Ensure for more calories - samples provided (if tolerated, recommend 2/day) Continue antiemetics + reglan   Support and encouragement     MONITORING, EVALUATION, GOAL: wt trends, intake   NEXT VISIT: Wednesday August 13 during infusion

## 2023-11-13 NOTE — Progress Notes (Signed)
 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   -she started first line chemo FLOT on 1/10 -She understands that chemotherapy is palliative, to prolong her life.  We are unlikely going to cure her cancer. -PD-L1 0-1%, no significant benefit from PD-L1 immunotherapy, FO revealed FGFR2 amplification and fusion (+), FGFR inhibitors can be considered in future, no other targeted therapy available  -She has been tolerating chemo very well, will continue for now  -PET scan from 05/30/2022 was negative for primary tumor or metastatic disease, the known peritoneal mets did not show on PET.  -she has been tolerating chemo well overall. Due to fatigue, I have changed her chemo from FLOT to FOLFOX on 06/20/2022, she tolerated well -she previously asked the role of surgery, depends on her next restaging CT scan findings, I may refer her to [facility] or [facility] to discuss HIPEC surgery  -due to her infusion reaction to oxaliplatin  on C5, we added additional premeds and gave slow infusion over 4 hours for cycle 6 and she tolerated well  -She is not able to return to work due to the cancer and treatment related symptoms.  -She is tolerating FOLFOX well overall, with moderate fatigue for a few days after infusion but able to recover well.  No signs of neuropathy at this point. -Restaging CT abdomen pelvis from August 27, 2022  showed no residual disease.  -We again discussed maintenance therapy with Xeloda  down the road, we will stop oxaliplatin  when she develops side effects especially neuropathy, or after next scan -repeated staging CT from 11/26/2021 showed stable disease  -I have changed her treatment to maintenance Xeloda  in early August 2024, she is tolerating well overall  -her NGS Caris showed positive Claudin 18.2, she is a candidate for zolbetuximab.  -Repeated EGD on March 21, 2023 showed residual gastric cancer.  We discussed option of changing her chemotherapy back to FOLFOX and add zolbetuximab.  -PET 06/03/2023 showed stable disease (no hypermetabolic disease outside stomach) -Patient developed recurrent abdominal pain, similar to the symptoms she had when she was diagnosed.  She agreed to change treatment back to FOLFOX, and add Zolbetuximab. She started on 07/09/2023. She tolerated first cycle poorly and had prolonged recovery. Oxaliplatin  was stop after cycle 1. She continued 5-fu/LV and zolbe every 2 weeks  -CT 10/10/2023 showed no evidence of disease   Assessment & Plan Metastatic gastric cancer Metastatic gastric cancer with ongoing treatment. Scheduled for PET scan on August 8th to assess disease status. Reports occasional vomiting without nausea, possibly related to cancer or treatment side effects. - Perform PET scan on August 8th - Continue chemotherapy  - Consult with dietitian in the infusion room  Nausea and vomiting Likely related to treatment.  intermittent vomiting without associated nausea.  She has tried multiple antiemetics which has not improved her vomiting.  Weight loss Weight loss of two pounds over two weeks. Consuming two to three nutritional supplements daily. Encouraged to increase intake  to three supplements per day to mitigate weight loss. - Increase nutritional supplement intake to three per day  Gastroesophageal reflux disease (GERD) GERD managed with acid reflux  medication. Reports that the medication is helpful for stomach symptoms. - Refill acid reflux medication  Potassium supplementation Potassium supplementation required. Current prescription is twice in the morning and twice at night. Prescription refilled but not yet picked up.   Plan - Lab reviewed, CMP still pending, if adequate, will proceed to treatment today and continue every 2 weeks - Follow-up in 2 weeks - She is scheduled for restaging PET scan August 8  SUMMARY OF ONCOLOGIC HISTORY: Oncology History Overview Note   Cancer Staging  Gastric cancer [facility]) Staging form: Stomach, AJCC 8th Edition - Clinical stage from 04/19/2022: Stage IVB (cT2, cN0, pM1) - Signed by [name], MD on 05/08/2022 Total positive nodes: 0     Gastric cancer (HCC)  03/30/2022 Procedure   EGD:  Impression:  - Normal esophagus. - A few gastric polyps. Biopsied. - Gastritis. Biopsied. - Non-bleeding gastric ulcer with no stigmata of bleeding. Biopsied. - Normal examined duodenum. Biopsied.  Findings: Diffuse moderate inflammation characterized by congestion (edema), friability and granularity was found in the cardia, in the gastric fundus and in the gastric body. There were associated erosions in multiple places. Biopsies were taken from the antrum, body, and fundus with a cold forceps for histology. Estimated blood loss was minimal.  One non-bleeding cratered gastric ulcer with no stigmata of bleeding was found on the greater curvature of the stomach. The lesion was 6 mm in largest dimension. The mucosa around the ulcer was heaped and led to some deformity in the antrum. Biopsies were taken with a cold forceps for histology. Estimated blood loss was minimal.    03/30/2022 Pathology Results   Patient: Sollars, Destin P  Accession: [id number]  Diagnosis 1. Surgical [P], duodenal - BENIGN SMALL BOWEL MUCOSA WITH NO SIGNIFICANT PATHOLOGIC CHANGES 2. Surgical [P], gastric antrum - GASTRIC  ANTRAL MUCOSA WITH FEATURES OF REACTIVE GASTROPATHY - NEGATIVE FOR H. PYLORI ON H&E STAIN - NEGATIVE FOR INTESTINAL METAPLASIA OR MALIGNANCY 3. Surgical [P], gastric body - GASTRIC OXYNTIC MUCOSA WITH REACTIVE/REPARATIVE CHANGES - NEGATIVE FOR H. PYLORI ON H&E STAIN - NEGATIVE FOR INTESTINAL METAPLASIA, DYSPLASIA OR MALIGNANCY 4. Surgical [P], greater curve ulceration - ADENOCARCINOMA WITH SIGNET RING CELL FEATURES (SEE NOTE) 5. Surgical [P], gastric polyps - ADENOCARCINOMA WITH SIGNET RING CELL FEATURES (SEE NOTE) 6. Surgical [P], fundus (gastric) - ADENOCARCINOMA WITH SIGNET RING CELL FEATURES (SEE NOTE) 7. Surgical [P], colon, ascending, polyp (1) - TUBULAR ADENOMA. - NO HIGH GRADE DYSPLASIA OR MALIGNANCY. 8. Surgical [P], colon, transverse, polyp (1) - TUBULAR ADENOMA. - NO HIGH GRADE DYSPLASIA OR MALIGNANCY.    04/13/2022 Initial Diagnosis   Gastric cancer (HCC)   04/19/2022 Cancer Staging   Staging form: Stomach, AJCC 8th Edition - Clinical stage from 04/19/2022: Stage IVB (cT2, cN0, pM1) - Signed by [name], MD on 05/08/2022 Total positive nodes: 0   05/05/2022 Genetic Testing   Negative genetic testing on the Multi-cancer gene panel + RNA.  FH c.259C>T VUS identified.  The report date is May 05, 2022.  The Multi-Cancer + RNA Panel offered by [facility] includes sequencing and/or deletion/duplication analysis of the following 70 genes:  AIP*, ALK, APC*, ATM*, AXIN2*, BAP1*, BARD1*, BLM*, BMPR1A*, BRCA1*, BRCA2*, BRIP1*, CDC73*, CDH1*, CDK4, CDKN1B*, CDKN2A, CHEK2*, CTNNA1*, DICER1*, EPCAM (del/dup only), EGFR, FH*, FLCN*, GREM1 (promoter dup only), HOXB13, KIT, LZTR1, MAX*, MBD4, MEN1*,  MET, MITF, MLH1*, MSH2*, MSH3*, MSH6*, MUTYH*, NF1*, NF2*, NTHL1*, PALB2*, PDGFRA, PMS2*, POLD1*, POLE*, POT1*, PRKAR1A*, PTCH1*, PTEN*, RAD51C*, RAD51D*, RB1*, RET, SDHA* (sequencing only), SDHAF2*, SDHB*, SDHC*, SDHD*, SMAD4*, SMARCA4*, SMARCB1*, SMARCE1*, STK11*, SUFU*, TMEM127*, TP53*, TSC1*,  TSC2*, VHL*. RNA analysis is performed for * genes.    05/09/2022 - 06/07/2022 Chemotherapy   Patient is on Treatment Plan : GASTROESOPHAGEAL FLOT q14d X 4 cycles      Miscellaneous   Foundation One  Biomarker Findings Microsatellite status- Cannot be determined Tumor Mutational Burden- Cannot be determined  Genomic Findings  FGFR2 amplification,FGFR2-TACC2 fusion,  Rearrangement intron 17 ARAF amplification CCND3 amplification TP53 V264fs*74     05/30/2022 Imaging    IMPRESSION: 1. Mild hypermetabolism corresponding to a dominant left upper quadrant mass and smaller perigastric nodules or nodes. 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 Recommend attention to the dominant left upper quadrant soft tissue mass on follow-up exams to exclude unlikely recurrent lymphoma. 2. No gastric hypermetabolism and no typical findings of metastatic disease.   06/20/2022 - 11/16/2022 Chemotherapy   Patient is on Treatment Plan : GASTRIC FOLFOX q14d x 12 cycles     08/27/2022 Imaging    IMPRESSION: No focal gastric mass on CT.   No findings suspicious for recurrent or metastatic disease.   Stable left upper abdominal soft tissue lesion and small lymph nodes, chronic, favoring treated lymphoma.   11/27/2022 Imaging    IMPRESSION: 1. Questionable thickening of the distal esophagus/GE junction and gastric antrum, consider further evaluation with endoscopy. 2. Chronically stable left upper quadrant nodularity and prominent lymph nodes again favored treated lymphoma. Continued attention on follow-up imaging suggested. 3. No convincing evidence of metastatic disease in the chest, abdomen or pelvis. 4. Questionable asymmetric wall thickening of the rectum, consider further evaluation with colonoscopy. 5. Mild wall thickening of a nondistended urinary bladder, correlate with urinalysis to exclude cystitis. 6. Hepatic steatosis.   07/10/2023 -   Chemotherapy   Patient is on Treatment Plan : GASTROESOPHAGEAL Zolbetuximab (800/400) + FOLFOX D1,15,29 q42d x 4 cycles / Zolbetuximab (400) + 5FU + Leucovorin  D1,15,29 q42d        Discussed the use of AI scribe software for clinical note transcription with the patient, who gave verbal consent to proceed.  History of Present Illness AURIANNA EARLYWINE is a 58 year old female with metastatic gastric cancer who presents for follow-up.  She experiences ongoing vomiting without nausea and has lost two pounds over the past two weeks. She is able to keep most food down and consumes two to three nutritional supplements like Ensure or Boost daily.  She takes medication for acid reflux, which she finds beneficial. Her potassium regimen includes tablets taken twice in the morning and twice at night, although her previous prescription was for twice in the morning and once at night. She has not yet picked up her latest potassium prescription.  No cough is present. She occasionally experiences stomach tenderness and notes that her stomach sometimes protrudes.     All other systems were reviewed with the patient and are negative.  MEDICAL HISTORY:  Past Medical History:  Diagnosis Date   Blood transfusion without reported diagnosis    had transfusion with hysterectomy   Cataract    Colon polyps 2012   Diabetes (HCC) 03/13/2021   Diabetes (HCC) 05/21/2019   Family history of breast cancer    Family history of pancreatic cancer    Family history of stomach cancer  Fibroid    gastric ca 03/2022   GERD (gastroesophageal reflux disease)    H/O blood clots    History of hysterectomy    fibroids and heavy cycles   Hypertension     SURGICAL HISTORY: Past Surgical History:  Procedure Laterality Date   ABDOMINAL HYSTERECTOMY     BIOPSY  04/19/2022   Procedure: BIOPSY;  Surgeon: Wilhelmenia Aloha Raddle., MD;  Location: [facility];  Service: Gastroenterology;;   BIOPSY  03/21/2023    Procedure: BIOPSY;  Surgeon: Wilhelmenia Aloha Raddle., MD;  Location: [facility];  Service: Gastroenterology;;   COLONOSCOPY     ESOPHAGOGASTRODUODENOSCOPY (EGD) WITH PROPOFOL  N/A 04/19/2022   Procedure: ESOPHAGOGASTRODUODENOSCOPY (EGD) WITH PROPOFOL ;  Surgeon: Wilhelmenia Aloha Raddle., MD;  Location: [facility];  Service: Gastroenterology;  Laterality: N/A;   ESOPHAGOGASTRODUODENOSCOPY (EGD) WITH PROPOFOL  N/A 03/21/2023   Procedure: ESOPHAGOGASTRODUODENOSCOPY (EGD) WITH PROPOFOL ;  Surgeon: Wilhelmenia Aloha Raddle., MD;  Location: [facility];  Service: Gastroenterology;  Laterality: N/A;   EUS N/A 04/19/2022   Procedure: UPPER ENDOSCOPIC ULTRASOUND (EUS) RADIAL;  Surgeon: Wilhelmenia Aloha Raddle., MD;  Location: [facility];  Service: Gastroenterology;  Laterality: N/A;   EXCISION OF SKIN TAG  05/03/2022   Procedure: EXCISION OF CHEST WALL SKIN LESION;  Surgeon: Dasie Leonor CROME, MD;  Location: [facility];  Service: General;;   LAPAROSCOPY N/A 05/03/2022   Procedure: LAPAROSCOPY DIAGNOSTIC WITH PERITONEAL WASHINGS;  Surgeon: Dasie Leonor CROME, MD;  Location: [facility];  Service: General;  Laterality: N/A;   POLYPECTOMY  04/19/2022   Procedure: POLYPECTOMY;  Surgeon: Wilhelmenia Aloha Raddle., MD;  Location: [facility];  Service: Gastroenterology;;   PORTACATH PLACEMENT N/A 05/03/2022   Procedure: INSERTION PORT-A-CATH WITH ULTRASOUND GUIDANCE;  Surgeon: Dasie Leonor CROME, MD;  Location: [facility];  Service: General;  Laterality: N/A;   UPPER GASTROINTESTINAL ENDOSCOPY      I have reviewed the social history and family history with the patient and they are unchanged from previous note.  ALLERGIES:  is allergic to aspirin, cyclobenzaprine, naproxen sodium, zithromax [azithromycin dihydrate], oxaliplatin , and dilaudid [hydromorphone].  MEDICATIONS:  Current Outpatient Medications  Medication Sig Dispense Refill   acetaminophen  (TYLENOL ) 500 MG tablet Take 2 tablets (1,000 mg total) by mouth every 8 (eight) hours  as needed (pain). 30 tablet 1   amLODipine  (NORVASC ) 10 MG tablet Take 1 tablet (10 mg total) by mouth daily. 30 tablet 2   b complex vitamins capsule Take 1 capsule by mouth daily.     calcium -vitamin D (OSCAL WITH D) 500-5 MG-MCG tablet Take 2 tablets by mouth 2 (two) times daily.     famotidine  (PEPCID ) 20 MG tablet Take 1 tablet (20 mg total) by mouth 2 (two) times daily. 60 tablet 2   HYDROcodone -acetaminophen  (NORCO/VICODIN) 5-325 MG tablet Take 1 tablet by mouth every 6 (six) hours as needed for moderate pain (pain score 4-6). 20 tablet 0   lidocaine -prilocaine  (EMLA ) cream Apply 1 Application topically as needed. 30 g 1   metoCLOPramide  (REGLAN ) 10 MG tablet Take 1 tablet (10 mg total) by mouth every 8 (eight) hours as needed for nausea. 60 tablet 1   pantoprazole  (PROTONIX ) 40 MG tablet Take 1 tablet (40 mg total) by mouth daily. 30 tablet 2   potassium chloride  SA (KLOR-CON  M) 20 MEQ tablet Take 2 tablets (40 mEq total) by mouth 2 (two) times daily for 5 days, THEN 1 tablet (20 mEq total) 2 (two) times daily for 25 days. 80 tablet 0   prochlorperazine  (COMPAZINE ) 10 MG tablet Take  1 tablet (10 mg total) by mouth every 6 (six) hours as needed for nausea or vomiting. 30 tablet 2   promethazine  (PHENERGAN ) 50 MG tablet Take 1 tablet (50 mg total) by mouth 2 (two) times daily as needed for nausea or vomiting. 45 tablet 1   scopolamine  (TRANSDERM-SCOP) 1 MG/3DAYS Place 1 patch (1.5 mg total) onto the skin every 3 (three) days. 10 patch 1   sucralfate  (CARAFATE ) 1 g tablet Take 1 tablet (1 g total) by mouth 2 (two) times daily. 60 tablet 6   zinc gluconate 50 MG tablet Take 50 mg by mouth daily.     No current facility-administered medications for this visit.    PHYSICAL EXAMINATION: ECOG PERFORMANCE STATUS: 1 - Symptomatic but completely ambulatory  Vitals:   11/13/23 0939  BP: 118/76  Pulse: 99  Resp: 16  Temp: (!) 97.3 F (36.3 C)  SpO2: 98%   Wt Readings from Last 3 Encounters:   11/13/23 172 lb (78 kg)  10/29/23 175 lb 6.4 oz (79.6 kg)  10/16/23 179 lb 4.8 oz (81.3 kg)     GENERAL:alert, no distress and comfortable SKIN: skin color, texture, turgor are normal, no rashes or significant lesions EYES: normal, Conjunctiva are pink and non-injected, sclera clear NECK: supple, thyroid  normal size, non-tender, without nodularity LYMPH:  no palpable lymphadenopathy in the cervical, axillary  LUNGS: clear to auscultation and percussion with normal breathing effort HEART: regular rate & rhythm and no murmurs and no lower extremity edema ABDOMEN:abdomen soft, non-tender and normal bowel sounds Musculoskeletal:no cyanosis of digits and no clubbing  NEURO: alert & oriented x 3 with fluent speech, no focal motor/sensory deficits  Physical Exam CHEST: Lungs clear to auscultation bilaterally. ABDOMEN: Tender to palpation.  LABORATORY DATA:  I have reviewed the data as listed    Latest Ref Rng & Units 11/13/2023    9:22 AM 10/29/2023    8:33 AM 10/16/2023    8:54 AM  CBC  WBC 4.0 - 10.5 K/uL 2.8  2.5  3.8   Hemoglobin 12.0 - 15.0 g/dL 86.4  86.7  86.4   Hematocrit 36.0 - 46.0 % 37.9  37.7  37.6   Platelets 150 - 400 K/uL 308  276  292         Latest Ref Rng & Units 10/29/2023    8:33 AM 10/16/2023    8:54 AM 10/10/2023   12:23 AM  CMP  Glucose 70 - 99 mg/dL 864  848  866   BUN 6 - 20 mg/dL 8  8  9    Creatinine 0.44 - 1.00 mg/dL 9.36  9.31  9.38   Sodium 135 - 145 mmol/L 137  138  134   Potassium 3.5 - 5.1 mmol/L 3.7  3.7  3.2   Chloride 98 - 111 mmol/L 104  102  100   CO2 22 - 32 mmol/L 28  29  24    Calcium  8.9 - 10.3 mg/dL 8.7  8.9  8.5   Total Protein 6.5 - 8.1 g/dL 6.0  6.2  5.7   Total Bilirubin 0.0 - 1.2 mg/dL 0.4  0.5  0.8   Alkaline Phos 38 - 126 U/L 94  97  77   AST 15 - 41 U/L 21  21  22    ALT 0 - 44 U/L 15  14  19        RADIOGRAPHIC STUDIES: I have personally reviewed the radiological images as listed and agreed with the findings in the  report.  No results found.    No orders of the defined types were placed in this encounter.  All questions were answered. The patient knows to call the clinic with any problems, questions or concerns. No barriers to learning was detected. The total time spent in the appointment was 25 minutes, including review of chart and various tests results, discussions about plan of care and coordination of care plan     Onita Mattock, MD 11/13/2023

## 2023-11-15 ENCOUNTER — Ambulatory Visit

## 2023-11-15 DIAGNOSIS — C162 Malignant neoplasm of body of stomach: Secondary | ICD-10-CM

## 2023-11-15 DIAGNOSIS — Z5112 Encounter for antineoplastic immunotherapy: Secondary | ICD-10-CM | POA: Diagnosis not present

## 2023-11-15 MED ORDER — HEPARIN SOD (PORK) LOCK FLUSH 100 UNIT/ML IV SOLN: 500.0000 [IU] | Freq: Once | INTRAVENOUS | Status: DC | PRN

## 2023-11-15 MED ORDER — SODIUM CHLORIDE 0.9% FLUSH: 10.0000 mL | INTRAVENOUS | Status: DC | PRN

## 2023-11-15 MED ORDER — SODIUM CHLORIDE 0.9 % IV SOLN: INTRAVENOUS | Status: DC

## 2023-11-15 NOTE — Patient Instructions (Signed)

## 2023-11-26 ENCOUNTER — Other Ambulatory Visit (HOSPITAL_BASED_OUTPATIENT_CLINIC_OR_DEPARTMENT_OTHER): Payer: Self-pay

## 2023-11-26 ENCOUNTER — Other Ambulatory Visit (HOSPITAL_COMMUNITY): Payer: Self-pay

## 2023-11-26 ENCOUNTER — Other Ambulatory Visit: Payer: Self-pay

## 2023-11-26 ENCOUNTER — Other Ambulatory Visit: Payer: Self-pay | Admitting: Nurse Practitioner

## 2023-11-26 DIAGNOSIS — I10 Essential (primary) hypertension: Secondary | ICD-10-CM

## 2023-11-26 MED ORDER — AMLODIPINE BESYLATE 10 MG PO TABS
10.0000 mg | ORAL_TABLET | Freq: Every day | ORAL | 2 refills | Status: DC
Start: 2023-11-26 — End: 2024-02-23
  Filled 2023-11-26: qty 30, 30d supply, fill #0
  Filled 2023-12-23: qty 30, 30d supply, fill #1
  Filled 2024-01-22: qty 30, 30d supply, fill #2

## 2023-11-26 MED FILL — Fosaprepitant Dimeglumine For IV Infusion 150 MG (Base Eq): INTRAVENOUS | Qty: 5 | Status: AC

## 2023-11-26 NOTE — Assessment & Plan Note (Signed)
 [id number] with peritoneal metastasis. 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   -she started first line chemo FLOT on 1/10 -She understands that chemotherapy is palliative, to prolong her life.  We are unlikely going to cure her cancer. -PD-L1 0-1%, no significant benefit from PD-L1 immunotherapy, FO revealed FGFR2 amplification and fusion (+), FGFR inhibitors can be considered in future, no other targeted therapy available  -She has been tolerating chemo very well, will continue for now  -PET scan from 05/30/2022 was negative for primary tumor or metastatic disease, the known peritoneal mets did not show on PET.  -she has been tolerating chemo well overall. Due to fatigue, I have changed her chemo from FLOT to FOLFOX on 06/20/2022, she tolerated well -she previously asked the role of surgery, depends on her next restaging CT scan findings, I may refer her to [facility] or [facility] to discuss HIPEC surgery  -due to her infusion reaction to oxaliplatin  on C5, we added additional premeds and gave slow infusion over 4 hours for cycle 6 and she tolerated well  -She is not able to return to work due to the cancer and treatment related symptoms.  -She is tolerating FOLFOX well overall, with moderate fatigue for a few days after infusion but able to recover well.  No signs of neuropathy at this point. -Restaging CT abdomen pelvis from August 27, 2022 showed no residual disease.  -We again discussed maintenance therapy with Xeloda  down the road, we will stop oxaliplatin  when she develops side effects especially neuropathy, or after next scan -repeated staging CT from 11/26/2021 showed stable disease  -I have changed her treatment to maintenance Xeloda  in early August 2024, she is tolerating well overall  -her NGS Caris showed positive Claudin 18.2, she is a  candidate for zolbetuximab.  -Repeated EGD on March 21, 2023 showed residual gastric cancer.  We discussed option of changing her chemotherapy back to FOLFOX and add zolbetuximab.  -PET 06/03/2023 showed stable disease (no hypermetabolic disease outside stomach) -Patient developed recurrent abdominal pain, similar to the symptoms she had when she was diagnosed.  She agreed to change treatment back to FOLFOX, and add Zolbetuximab. She started on 07/09/2023. She tolerated first cycle poorly and had prolonged recovery. Oxaliplatin  was stop after cycle 1. She continued 5-fu/LV and zolbe every 2 weeks  -CT 10/10/2023 showed no evidence of disease

## 2023-11-27 ENCOUNTER — Inpatient Hospital Stay (HOSPITAL_BASED_OUTPATIENT_CLINIC_OR_DEPARTMENT_OTHER): Admitting: Hematology

## 2023-11-27 ENCOUNTER — Other Ambulatory Visit: Payer: Self-pay

## 2023-11-27 ENCOUNTER — Inpatient Hospital Stay

## 2023-11-27 ENCOUNTER — Other Ambulatory Visit (HOSPITAL_COMMUNITY): Payer: Self-pay

## 2023-11-27 VITALS — BP 118/75 | HR 97 | Resp 16

## 2023-11-27 VITALS — BP 128/80 | HR 106 | Temp 97.3°F | Resp 14 | Ht 68.0 in | Wt 170.5 lb

## 2023-11-27 DIAGNOSIS — C162 Malignant neoplasm of body of stomach: Secondary | ICD-10-CM

## 2023-11-27 DIAGNOSIS — Z5112 Encounter for antineoplastic immunotherapy: Secondary | ICD-10-CM | POA: Diagnosis not present

## 2023-11-27 DIAGNOSIS — Z95828 Presence of other vascular implants and grafts: Secondary | ICD-10-CM

## 2023-11-27 LAB — CMP (CANCER CENTER ONLY)
ALT: 12 U/L (ref 0–44)
AST: 19 U/L (ref 15–41)
Albumin: 3.6 g/dL (ref 3.5–5.0)
Alkaline Phosphatase: 84 U/L (ref 38–126)
Anion gap: 6 (ref 5–15)
BUN: 7 mg/dL (ref 6–20)
CO2: 30 mmol/L (ref 22–32)
Calcium: 8.9 mg/dL (ref 8.9–10.3)
Chloride: 101 mmol/L (ref 98–111)
Creatinine: 0.73 mg/dL (ref 0.44–1.00)
GFR, Estimated: >60 mL/min (ref >60)
Glucose, Bld: 168 mg/dL — ABNORMAL HIGH (ref 70–99)
Potassium: 3.3 mmol/L — ABNORMAL LOW (ref 3.5–5.1)
Sodium: 137 mmol/L (ref 135–145)
Total Bilirubin: 0.5 mg/dL (ref 0.0–1.2)
Total Protein: 6.3 g/dL — ABNORMAL LOW (ref 6.5–8.1)

## 2023-11-27 LAB — CBC WITH DIFFERENTIAL (CANCER CENTER ONLY)
Abs Immature Granulocytes: 0.01 K/uL (ref 0.00–0.07)
Basophils Absolute: 0 K/uL (ref 0.0–0.1)
Basophils Relative: 1 %
Eosinophils Absolute: 0.3 K/uL (ref 0.0–0.5)
Eosinophils Relative: 10 %
HCT: 38.3 % (ref 36.0–46.0)
Hemoglobin: 13.3 g/dL (ref 12.0–15.0)
Immature Granulocytes: 0 %
Lymphocytes Relative: 31 %
Lymphs Abs: 0.9 K/uL (ref 0.7–4.0)
MCH: 28.9 pg (ref 26.0–34.0)
MCHC: 34.7 g/dL (ref 30.0–36.0)
MCV: 83.3 fL (ref 80.0–100.0)
Monocytes Absolute: 0.5 K/uL (ref 0.1–1.0)
Monocytes Relative: 16 %
Neutro Abs: 1.2 K/uL — ABNORMAL LOW (ref 1.7–7.7)
Neutrophils Relative %: 42 %
Platelet Count: 291 K/uL (ref 150–400)
RBC: 4.6 MIL/uL (ref 3.87–5.11)
RDW: 14.5 % (ref 11.5–15.5)
WBC Count: 2.9 K/uL — ABNORMAL LOW (ref 4.0–10.5)
nRBC: 0 % (ref 0.0–0.2)

## 2023-11-27 MED ORDER — SODIUM CHLORIDE 0.9 % IV SOLN
2100.0000 mg/m2 | INTRAVENOUS | Status: DC
  Administered 2023-11-27: 4450 mg via INTRAVENOUS
  Filled 2023-11-27: qty 89

## 2023-11-27 MED ORDER — PROCHLORPERAZINE MALEATE 10 MG PO TABS
5.0000 mg | ORAL_TABLET | Freq: Once | ORAL | Status: AC
  Administered 2023-11-27: 5 mg via ORAL
  Filled 2023-11-27: qty 1

## 2023-11-27 MED ORDER — FOSAPREPITANT DIMEGLUMINE INJECTION 150 MG
150.0000 mg | Freq: Once | INTRAVENOUS | Status: AC
  Administered 2023-11-27: 150 mg via INTRAVENOUS
  Filled 2023-11-27: qty 150

## 2023-11-27 MED ORDER — SODIUM CHLORIDE 0.9 % IV SOLN
400.0000 mg/m2 | Freq: Once | INTRAVENOUS | Status: AC
  Administered 2023-11-27: 844 mg via INTRAVENOUS
  Filled 2023-11-27: qty 42.2

## 2023-11-27 MED ORDER — ZOLBETUXIMAB-CLZB CHEMO 100MG/5ML IV SOLN
700.0000 mg | Freq: Once | INTRAVENOUS | Status: AC
  Administered 2023-11-27: 700 mg via INTRAVENOUS
  Filled 2023-11-27: qty 30

## 2023-11-27 MED ORDER — SODIUM CHLORIDE 0.9% FLUSH: 10.0000 mL | INTRAVENOUS | Status: DC | PRN

## 2023-11-27 MED ORDER — LORAZEPAM 2 MG/ML IJ SOLN
0.5000 mg | Freq: Once | INTRAMUSCULAR | Status: AC | PRN
  Administered 2023-11-27: 0.5 mg via INTRAVENOUS
  Filled 2023-11-27: qty 1

## 2023-11-27 MED ORDER — OLANZAPINE 5 MG PO TABS
5.0000 mg | ORAL_TABLET | Freq: Once | ORAL | Status: AC
Start: 2023-11-27 — End: 2023-11-27
  Administered 2023-11-27: 5 mg via ORAL
  Filled 2023-11-27: qty 1

## 2023-11-27 MED ORDER — FAMOTIDINE IN NACL 20-0.9 MG/50ML-% IV SOLN
20.0000 mg | Freq: Once | INTRAVENOUS | Status: AC
  Administered 2023-11-27: 20 mg via INTRAVENOUS
  Filled 2023-11-27: qty 50

## 2023-11-27 MED ORDER — DIPHENHYDRAMINE HCL 25 MG PO CAPS
25.0000 mg | ORAL_CAPSULE | Freq: Once | ORAL | Status: AC
  Administered 2023-11-27: 25 mg via ORAL
  Filled 2023-11-27: qty 1

## 2023-11-27 MED ORDER — SODIUM CHLORIDE 0.9% FLUSH
10.0000 mL | Freq: Once | INTRAVENOUS | Status: AC
Start: 2023-11-27 — End: 2023-11-27
  Administered 2023-11-27: 10 mL

## 2023-11-27 MED ORDER — DEXAMETHASONE SODIUM PHOSPHATE 10 MG/ML IJ SOLN
10.0000 mg | Freq: Once | INTRAMUSCULAR | Status: AC
  Administered 2023-11-27: 10 mg via INTRAVENOUS
  Filled 2023-11-27: qty 1

## 2023-11-27 MED ORDER — PALONOSETRON HCL INJECTION 0.25 MG/5ML
0.2500 mg | Freq: Once | INTRAVENOUS | Status: AC
  Administered 2023-11-27: 0.25 mg via INTRAVENOUS
  Filled 2023-11-27: qty 5

## 2023-11-27 MED ORDER — SODIUM CHLORIDE 0.9 % IV SOLN: INTRAVENOUS | Status: AC

## 2023-11-27 NOTE — Progress Notes (Signed)
 [facility]   [phone] [phone]   Clinic Follow up Note   Patient Care Team: [name], MD as PCP - General (Internal Medicine) [name], MD as Consulting Physician (Oncology)  Date of Service:  11/27/2023  CHIEF COMPLAINT: f/u of gastric cancer  CURRENT THERAPY:  5-FU pump infusion and Zolbe every 2 weeks  Oncology History   Gastric cancer (HCC) cT2N0M1 with peritoneal metastasis. 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   -she started first line chemo FLOT on 1/10 -She understands that chemotherapy is palliative, to prolong her life.  We are unlikely going to cure her cancer. -PD-L1 0-1%, no significant benefit from PD-L1 immunotherapy, FO revealed FGFR2 amplification and fusion (+), FGFR inhibitors can be considered in future, no other targeted therapy available  -She has been tolerating chemo very well, will continue for now  -PET scan from 05/30/2022 was negative for primary tumor or metastatic disease, the known peritoneal mets did not show on PET.  -she has been tolerating chemo well overall. Due to fatigue, I have changed her chemo from FLOT to FOLFOX on 06/20/2022, she tolerated well -she previously asked the role of surgery, depends on her next restaging CT scan findings, I may refer her to [facility] or [facility] to discuss HIPEC surgery  -due to her infusion reaction to oxaliplatin  on C5, we added additional premeds and gave slow infusion over 4 hours for cycle 6 and she tolerated well  -She is not able to return to work due to the cancer and treatment related symptoms.  -She is tolerating FOLFOX well overall, with moderate fatigue for a few days after infusion but able to recover well.  No signs of neuropathy at this point. -Restaging CT abdomen pelvis from August 27, 2022  showed no residual disease.  -We again discussed maintenance therapy with Xeloda  down the road, we will stop oxaliplatin  when she develops side effects especially neuropathy, or after next scan -repeated staging CT from 11/26/2021 showed stable disease  -I have changed her treatment to maintenance Xeloda  in early August 2024, she is tolerating well overall  -her NGS Caris showed positive Claudin 18.2, she is a candidate for zolbetuximab.  -Repeated EGD on March 21, 2023 showed residual gastric cancer.  We discussed option of changing her chemotherapy back to FOLFOX and add zolbetuximab.  -PET 06/03/2023 showed stable disease (no hypermetabolic disease outside stomach) -Patient developed recurrent abdominal pain, similar to the symptoms she had when she was diagnosed.  She agreed to change treatment back to FOLFOX, and add Zolbetuximab. She started on 07/09/2023. She tolerated first cycle poorly and had prolonged recovery. Oxaliplatin  was stop after cycle 1. She continued 5-fu/LV and zolbe every 2 weeks  -CT 10/10/2023 showed no evidence of disease   Assessment & Plan Gastric cancer with associated vomiting, unintentional weight loss, and decreased oral intake Gastric cancer with ongoing symptoms of vomiting, unintentional weight loss, and decreased oral intake. Vomiting occurs daily or every other day, likely related to antibody treatment. Weight has decreased to 170 lbs from 172 lbs. Oral intake is limited due to stomach discomfort and lack of appetite, though she feels hungry. She is able to tolerate some liquids like Glycine and slushies. Current medications include pantoprazole  and sucralfate . Mild dehydration is present, with dizziness upon standing quickly. - Encourage increased intake of Glycine and other tolerable liquids  to maintain weight. - Monitor blood sugar levels due to sugar content in Glycine. - Perform PET scan next week to assess disease status. - Consider repeat endoscopy if pain  worsens and PET scan is inconclusive. - Administer 500 cc normal saline IV fluids for dehydration as needed. - Encourage intake of at least 40 ounces of liquid daily, including water, Glycine, and other fluids.  Mild neutropenia Mild neutropenia with slightly low white blood cell count. Count is sufficient to continue treatment.  Plan - She is clinically stable, lab reviewed, adequate for treatment, will proceed treatment today and continue every 2 weeks  - She is scheduled for restaging PET scan next Friday - Follow-up in 2 weeks  SUMMARY OF ONCOLOGIC HISTORY: Oncology History Overview Note   Cancer Staging  Gastric cancer [facility]) Staging form: Stomach, AJCC 8th Edition - Clinical stage from 04/19/2022: Stage IVB (cT2, cN0, pM1) - Signed by [name], MD on 05/08/2022 Total positive nodes: 0     Gastric cancer (HCC)  03/30/2022 Procedure   EGD:  Impression:  - Normal esophagus. - A few gastric polyps. Biopsied. - Gastritis. Biopsied. - Non-bleeding gastric ulcer with no stigmata of bleeding. Biopsied. - Normal examined duodenum. Biopsied.  Findings: Diffuse moderate inflammation characterized by congestion (edema), friability and granularity was found in the cardia, in the gastric fundus and in the gastric body. There were associated erosions in multiple places. Biopsies were taken from the antrum, body, and fundus with a cold forceps for histology. Estimated blood loss was minimal.  One non-bleeding cratered gastric ulcer with no stigmata of bleeding was found on the greater curvature of the stomach. The lesion was 6 mm in largest dimension. The mucosa around the ulcer was heaped and led to some deformity in the antrum. Biopsies were taken with a cold forceps for histology. Estimated blood loss was minimal.    03/30/2022 Pathology Results   [name]  Accession: [id number]  Diagnosis 1. Surgical [[name]], duodenal - BENIGN SMALL BOWEL MUCOSA WITH NO  SIGNIFICANT PATHOLOGIC CHANGES 2. Surgical [[name]], gastric antrum - GASTRIC ANTRAL MUCOSA WITH FEATURES OF REACTIVE GASTROPATHY - NEGATIVE FOR H. PYLORI ON H&E STAIN - NEGATIVE FOR INTESTINAL METAPLASIA OR MALIGNANCY 3. Surgical [[name]], gastric body - GASTRIC OXYNTIC MUCOSA WITH REACTIVE/REPARATIVE CHANGES - NEGATIVE FOR H. PYLORI ON H&E STAIN - NEGATIVE FOR INTESTINAL METAPLASIA, DYSPLASIA OR MALIGNANCY 4. Surgical [[name]], greater curve ulceration - ADENOCARCINOMA WITH SIGNET RING CELL FEATURES (SEE NOTE) 5. Surgical [[name]], gastric polyps - ADENOCARCINOMA WITH SIGNET RING CELL FEATURES (SEE NOTE) 6. Surgical [[name]], fundus (gastric) - ADENOCARCINOMA WITH SIGNET RING CELL FEATURES (SEE NOTE) 7. Surgical [[name]], colon, ascending, polyp (1) - TUBULAR ADENOMA. - NO HIGH GRADE DYSPLASIA OR MALIGNANCY. 8. Surgical [[name]], colon, transverse, polyp (1) - TUBULAR ADENOMA. - NO HIGH GRADE DYSPLASIA OR MALIGNANCY.    04/13/2022 Initial Diagnosis   Gastric cancer (HCC)   04/19/2022 Cancer Staging   Staging form: Stomach, AJCC 8th Edition - Clinical stage from 04/19/2022: Stage IVB (cT2, cN0, pM1) - Signed by [name], MD on 05/08/2022 Total positive nodes: 0   05/05/2022 Genetic Testing   Negative genetic testing on the Multi-cancer gene panel + RNA.  FH c.259C>T VUS identified.  The report date is May 05, 2022.  The Multi-Cancer + RNA Panel offered by [facility] includes sequencing and/or deletion/duplication analysis of the following 70 genes:  AIP*, ALK, APC*, ATM*, AXIN2*, BAP1*, BARD1*, BLM*, BMPR1A*, BRCA1*, BRCA2*, BRIP1*, CDC73*, CDH1*, CDK4, CDKN1B*, CDKN2A, CHEK2*, CTNNA1*,  DICER1*, EPCAM (del/dup only), EGFR, FH*, FLCN*, GREM1 (promoter dup only), HOXB13, KIT, LZTR1, MAX*, MBD4, MEN1*, MET, MITF, MLH1*, MSH2*, MSH3*, MSH6*, MUTYH*, NF1*, NF2*, NTHL1*, PALB2*, PDGFRA, PMS2*, POLD1*, POLE*, POT1*, PRKAR1A*, PTCH1*, PTEN*, RAD51C*, RAD51D*, RB1*, RET, SDHA* (sequencing only), SDHAF2*, SDHB*, SDHC*, SDHD*,  SMAD4*, SMARCA4*, SMARCB1*, SMARCE1*, STK11*, SUFU*, TMEM127*, TP53*, TSC1*, TSC2*, VHL*. RNA analysis is performed for * genes.    05/09/2022 - 06/07/2022 Chemotherapy   Patient is on Treatment Plan : GASTROESOPHAGEAL FLOT q14d X 4 cycles      Miscellaneous   Foundation One  Biomarker Findings Microsatellite status- Cannot be determined Tumor Mutational Burden- Cannot be determined  Genomic Findings  FGFR2 amplification,FGFR2-TACC2 fusion,  Rearrangement intron 17 ARAF amplification CCND3 amplification TP53 [id number]*74     05/30/2022 Imaging    IMPRESSION: 1. Mild hypermetabolism corresponding to a dominant left upper quadrant mass and smaller perigastric nodules or nodes. 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 Recommend attention to the dominant left upper quadrant soft tissue mass on follow-up exams to exclude unlikely recurrent lymphoma. 2. No gastric hypermetabolism and no typical findings of metastatic disease.   06/20/2022 - 11/16/2022 Chemotherapy   Patient is on Treatment Plan : GASTRIC FOLFOX q14d x 12 cycles     08/27/2022 Imaging    IMPRESSION: No focal gastric mass on CT.   No findings suspicious for recurrent or metastatic disease.   Stable left upper abdominal soft tissue lesion and small lymph nodes, chronic, favoring treated lymphoma.   11/27/2022 Imaging    IMPRESSION: 1. Questionable thickening of the distal esophagus/GE junction and gastric antrum, consider further evaluation with endoscopy. 2. Chronically stable left upper quadrant nodularity and prominent lymph nodes again favored treated lymphoma. Continued attention on follow-up imaging suggested. 3. No convincing evidence of metastatic disease in the chest, abdomen or pelvis. 4. Questionable asymmetric wall thickening of the rectum, consider further evaluation with colonoscopy. 5. Mild wall thickening of a nondistended urinary bladder, correlate with  urinalysis to exclude cystitis. 6. Hepatic steatosis.   07/10/2023 -  Chemotherapy   Patient is on Treatment Plan : GASTROESOPHAGEAL Zolbetuximab (800/400) + FOLFOX D1,15,29 q42d x 4 cycles / Zolbetuximab (400) + 5FU + Leucovorin  D1,15,29 q42d        Discussed the use of AI scribe software for clinical note transcription with the patient, who gave verbal consent to proceed.  History of Present Illness Sheryl [name] is a 59 year old female with gastric cancer who presents for follow-up.  She is experiencing ongoing weight loss, currently at 170 pounds, down from 172 pounds at her last visit. She has a lack of appetite and difficulty eating due to stomach pain, despite feeling hungry. Vomiting occurs daily or every other day, even after trying various foods. She consumes two bottles of Glycine daily, which is insufficient to maintain her weight.  She is taking pantoprazole  and sucralfate  for stomach issues. Slushies provide some relief. Previous CT and PET scans have not been sensitive in detecting her disease.  Mild neutropenia and an elevated heart rate of 106 bpm are present, possibly related to dehydration and weakness. She is sedentary and experiences dizziness upon standing quickly. Her fluid intake is less than 40-50 ounces per day, including Gatorade, tea, and chicken noodle soup, with no water consumption.     All other systems were reviewed with the patient and are negative.  MEDICAL HISTORY:  Past Medical History:  Diagnosis Date   Blood transfusion without reported  diagnosis    had transfusion with hysterectomy   Cataract    Colon polyps 2012   Diabetes (HCC) 03/13/2021   Diabetes (HCC) 05/21/2019   Family history of breast cancer    Family history of pancreatic cancer    Family history of stomach cancer    Fibroid    gastric ca 03/2022   GERD (gastroesophageal reflux disease)    H/O blood clots    History of hysterectomy    fibroids and heavy cycles    Hypertension     SURGICAL HISTORY: Past Surgical History:  Procedure Laterality Date   ABDOMINAL HYSTERECTOMY     BIOPSY  04/19/2022   Procedure: BIOPSY;  Surgeon: Wilhelmenia Aloha Raddle., MD;  Location: [facility];  Service: Gastroenterology;;   BIOPSY  03/21/2023   Procedure: BIOPSY;  Surgeon: Wilhelmenia Aloha Raddle., MD;  Location: [facility];  Service: Gastroenterology;;   COLONOSCOPY     ESOPHAGOGASTRODUODENOSCOPY (EGD) WITH PROPOFOL  N/A 04/19/2022   Procedure: ESOPHAGOGASTRODUODENOSCOPY (EGD) WITH PROPOFOL ;  Surgeon: Wilhelmenia Aloha Raddle., MD;  Location: [facility];  Service: Gastroenterology;  Laterality: N/A;   ESOPHAGOGASTRODUODENOSCOPY (EGD) WITH PROPOFOL  N/A 03/21/2023   Procedure: ESOPHAGOGASTRODUODENOSCOPY (EGD) WITH PROPOFOL ;  Surgeon: Wilhelmenia Aloha Raddle., MD;  Location: [facility];  Service: Gastroenterology;  Laterality: N/A;   EUS N/A 04/19/2022   Procedure: UPPER ENDOSCOPIC ULTRASOUND (EUS) RADIAL;  Surgeon: Wilhelmenia Aloha Raddle., MD;  Location: [facility];  Service: Gastroenterology;  Laterality: N/A;   EXCISION OF SKIN TAG  05/03/2022   Procedure: EXCISION OF CHEST WALL SKIN LESION;  Surgeon: Dasie Leonor CROME, MD;  Location: [facility];  Service: General;;   LAPAROSCOPY N/A 05/03/2022   Procedure: LAPAROSCOPY DIAGNOSTIC WITH PERITONEAL WASHINGS;  Surgeon: Dasie Leonor CROME, MD;  Location: [facility];  Service: General;  Laterality: N/A;   POLYPECTOMY  04/19/2022   Procedure: POLYPECTOMY;  Surgeon: Wilhelmenia Aloha Raddle., MD;  Location: [facility];  Service: Gastroenterology;;   PORTACATH PLACEMENT N/A 05/03/2022   Procedure: INSERTION PORT-A-CATH WITH ULTRASOUND GUIDANCE;  Surgeon: Dasie Leonor CROME, MD;  Location: [facility];  Service: General;  Laterality: N/A;   UPPER GASTROINTESTINAL ENDOSCOPY      I have reviewed the social history and family history with the patient and they are unchanged from previous note.  ALLERGIES:  is allergic to aspirin, cyclobenzaprine,  naproxen sodium, zithromax [azithromycin dihydrate], oxaliplatin , and dilaudid [hydromorphone].  MEDICATIONS:  Current Outpatient Medications  Medication Sig Dispense Refill   acetaminophen  (TYLENOL ) 500 MG tablet Take 2 tablets (1,000 mg total) by mouth every 8 (eight) hours as needed (pain). 30 tablet 1   amLODipine  (NORVASC ) 10 MG tablet Take 1 tablet (10 mg total) by mouth daily. 30 tablet 2   b complex vitamins capsule Take 1 capsule by mouth daily.     calcium -vitamin D (OSCAL WITH D) 500-5 MG-MCG tablet Take 2 tablets by mouth 2 (two) times daily.     famotidine  (PEPCID ) 20 MG tablet Take 1 tablet (20 mg total) by mouth 2 (two) times daily. 60 tablet 2   HYDROcodone -acetaminophen  (NORCO/VICODIN) 5-325 MG tablet Take 1 tablet by mouth every 6 (six) hours as needed for moderate pain (pain score 4-6). 20 tablet 0   lidocaine -prilocaine  (EMLA ) cream Apply 1 Application topically as needed. 30 g 1   metoCLOPramide  (REGLAN ) 10 MG tablet Take 1 tablet (10 mg total) by mouth every 8 (eight) hours as needed for nausea. 60 tablet 1   pantoprazole  (PROTONIX ) 40 MG tablet Take 1 tablet (40 mg total) by mouth  daily. 30 tablet 2   potassium chloride  SA (KLOR-CON  M) 20 MEQ tablet Take 2 tablets (40 mEq total) by mouth 2 (two) times daily for 5 days, THEN 1 tablet (20 mEq total) 2 (two) times daily for 25 days. 80 tablet 0   prochlorperazine  (COMPAZINE ) 10 MG tablet Take 1 tablet (10 mg total) by mouth every 6 (six) hours as needed for nausea or vomiting. 30 tablet 2   promethazine  (PHENERGAN ) 50 MG tablet Take 1 tablet (50 mg total) by mouth 2 (two) times daily as needed for nausea or vomiting. 45 tablet 1   scopolamine  (TRANSDERM-SCOP) 1 MG/3DAYS Place 1 patch (1.5 mg total) onto the skin every 3 (three) days. 10 patch 1   sucralfate  (CARAFATE ) 1 g tablet Take 1 tablet (1 g total) by mouth 2 (two) times daily. 60 tablet 6   zinc gluconate 50 MG tablet Take 50 mg by mouth daily.     No current  facility-administered medications for this visit.   Facility-Administered Medications Ordered in Other Visits  Medication Dose Route Frequency Provider Last Rate Last Admin   0.9 %  sodium chloride  infusion   Intravenous Continuous [name], MD   Stopped at 11/13/23 1759   0.9 %  sodium chloride  infusion   Intravenous Continuous [name], MD 500 mL/hr at 11/27/23 0957 New Bag at 11/27/23 0957   dexamethasone  (DECADRON ) injection 10 mg  10 mg Intravenous Once [name], MD       diphenhydrAMINE  (BENADRYL ) capsule 25 mg  25 mg Oral Once [name], MD       famotidine  (PEPCID ) IVPB 20 mg premix  20 mg Intravenous Once [name], MD       fluorouracil  (ADRUCIL ) 4,450 mg in sodium chloride  0.9 % 61 mL chemo infusion  2,100 mg/m2 (Treatment Plan Recorded) Intravenous 1 day or 1 dose [name], MD       fosaprepitant  (EMEND) 150 mg in sodium chloride  0.9 % 145 mL IVPB  150 mg Intravenous Once [name], MD       leucovorin  844 mg in sodium chloride  0.9 % 250 mL infusion  400 mg/m2 (Treatment Plan Recorded) Intravenous Once [name], MD       LORazepam  (ATIVAN ) injection 0.5 mg  0.5 mg Intravenous Once PRN [name], MD       OLANZapine  (ZYPREXA ) tablet 5 mg  5 mg Oral Once [name], MD       palonosetron  (ALOXI ) injection 0.25 mg  0.25 mg Intravenous Once [name], MD       prochlorperazine  (COMPAZINE ) tablet 5 mg  5 mg Oral Q6H PRN [name], MD   5 mg at 11/13/23 1025   prochlorperazine  (COMPAZINE ) tablet 5 mg  5 mg Oral Once [name], MD       sodium chloride  flush (NS) 0.9 % injection 10 mL  10 mL Intracatheter PRN [name], MD       sodium chloride  flush (NS) 0.9 % injection 10 mL  10 mL Intracatheter PRN [name], MD       zolbetuximab-clzb  (VYLOY ) 740 mg in sodium chloride  0.9 % 111 mL (5 mg/mL) infusion  350 mg/m2 (Treatment Plan Recorded) Intravenous Once [name], MD        PHYSICAL EXAMINATION: ECOG PERFORMANCE STATUS: 2 - Symptomatic, <50% confined to bed  Vitals:   11/27/23  0926  BP: 128/80  Pulse: (!) 106  Resp: 14  Temp: (!) 97.3 F (36.3 C)  SpO2: 99%  Wt Readings from Last 3 Encounters:  11/27/23 170 lb 8 oz (77.3 kg)  11/13/23 172 lb (78 kg)  10/29/23 175 lb 6.4 oz (79.6 kg)     GENERAL:alert, no distress and comfortable SKIN: skin color, texture, turgor are normal, no rashes or significant lesions EYES: normal, Conjunctiva are pink and non-injected, sclera clear NECK: supple, thyroid  normal size, non-tender, without nodularity LYMPH:  no palpable lymphadenopathy in the cervical, axillary  LUNGS: clear to auscultation and percussion with normal breathing effort HEART: regular rate & rhythm and no murmurs and no lower extremity edema ABDOMEN:abdomen soft, non-tender and normal bowel sounds Musculoskeletal:no cyanosis of digits and no clubbing  NEURO: alert & oriented x 3 with fluent speech, no focal motor/sensory deficits  Physical Exam VITALS: [name]- 106   LABORATORY DATA:  I have reviewed the data as listed    Latest Ref Rng & Units 11/27/2023    9:07 AM 11/13/2023    9:22 AM 10/29/2023    8:33 AM  CBC  WBC 4.0 - 10.5 K/uL 2.9  2.8  2.5   Hemoglobin 12.0 - 15.0 g/dL 86.6  86.4  86.7   Hematocrit 36.0 - 46.0 % 38.3  37.9  37.7   Platelets 150 - 400 K/uL 291  308  276         Latest Ref Rng & Units 11/27/2023    9:07 AM 11/13/2023    9:22 AM 10/29/2023    8:33 AM  CMP  Glucose 70 - 99 mg/dL 831  876  864   BUN 6 - 20 mg/dL 7  7  8    Creatinine 0.44 - 1.00 mg/dL 9.26  9.38  9.36   Sodium 135 - 145 mmol/L 137  139  137   Potassium 3.5 - 5.1 mmol/L 3.3  2.9  3.7   Chloride 98 - 111 mmol/L 101  99  104   CO2 22 - 32 mmol/L 30  34  28   Calcium  8.9 - 10.3 mg/dL 8.9  9.0  8.7   Total Protein 6.5 - 8.1 g/dL 6.3  6.3  6.0   Total Bilirubin 0.0 - 1.2 mg/dL 0.5  0.4  0.4   Alkaline Phos 38 - 126 U/L 84  83  94   AST 15 - 41 U/L 19  23  21    ALT 0 - 44 U/L 12  14  15        RADIOGRAPHIC STUDIES: I have personally reviewed the  radiological images as listed and agreed with the findings in the report. No results found.    Orders Placed This Encounter  Procedures   CBC with Differential (Cancer Center Only)    Standing Status:   Future    Expected Date:   04/01/2024    Expiration Date:   04/01/2025   CMP (Cancer Center only)    Standing Status:   Future    Expected Date:   04/01/2024    Expiration Date:   04/01/2025   CBC with Differential (Cancer Center Only)    Standing Status:   Future    Expected Date:   04/15/2024    Expiration Date:   04/15/2025   CMP (Cancer Center only)    Standing Status:   Future    Expected Date:   04/15/2024    Expiration Date:   04/15/2025   CBC with Differential (Cancer Center Only)    Standing Status:   Future    Expected Date:   04/29/2024  Expiration Date:   04/29/2025   CMP (Cancer Center only)    Standing Status:   Future    Expected Date:   04/29/2024    Expiration Date:   04/29/2025   All questions were answered. The patient knows to call the clinic with any problems, questions or concerns. No barriers to learning was detected. The total time spent in the appointment was 25 minutes, including review of chart and various tests results, discussions about plan of care and coordination of care plan     Onita Mattock, MD 11/27/2023

## 2023-11-27 NOTE — Progress Notes (Signed)
 Patient observed for 2 hours post Vyloy  infusion.  VS stable.  Patient to lobby by WC.

## 2023-11-27 NOTE — Patient Instructions (Signed)
 [facility] - A DEPT OF [facility]  Discharge Instructions: Thank you for choosing [facility] to provide your oncology and hematology care.   If you have a lab appointment with the Cancer Center, please go directly to the Cancer Center and check in at the registration area.   Wear comfortable clothing and clothing appropriate for easy access to any Portacath or PICC line.   We strive to give you quality time with your provider. You may need to reschedule your appointment if you arrive late (15 or more minutes).  Arriving late affects you and other patients whose appointments are after yours.  Also, if you miss three or more appointments without notifying the office, you may be dismissed from the clinic at the provider's discretion.      For prescription refill requests, have your pharmacy contact our office and allow 72 hours for refills to be completed.    Today you received the following chemotherapy and/or immunotherapy agents Vyloy , Leucovorin , 5FU pump      To help prevent nausea and vomiting after your treatment, we encourage you to take your nausea medication as directed.  BELOW ARE SYMPTOMS THAT SHOULD BE REPORTED IMMEDIATELY: *FEVER GREATER THAN 100.4 F (38 C) OR HIGHER *CHILLS OR SWEATING *NAUSEA AND VOMITING THAT IS NOT CONTROLLED WITH YOUR NAUSEA MEDICATION *UNUSUAL SHORTNESS OF BREATH *UNUSUAL BRUISING OR BLEEDING *URINARY PROBLEMS (pain or burning when urinating, or frequent urination) *BOWEL PROBLEMS (unusual diarrhea, constipation, pain near the anus) TENDERNESS IN MOUTH AND THROAT WITH OR WITHOUT PRESENCE OF ULCERS (sore throat, sores in mouth, or a toothache) UNUSUAL RASH, SWELLING OR PAIN  UNUSUAL VAGINAL DISCHARGE OR ITCHING   Items with * indicate a potential emergency and should be followed up as soon as possible or go to the Emergency Department if any problems should occur.  Please show the CHEMOTHERAPY ALERT CARD  or IMMUNOTHERAPY ALERT CARD at check-in to the Emergency Department and triage nurse.  Should you have questions after your visit or need to cancel or reschedule your appointment, please contact [facility] - A DEPT OF [facility]  Dept: [phone]  and follow the prompts.  Office hours are 8:00 a.m. to 4:30 p.m. Monday - Friday. Please note that voicemails left after 4:00 p.m. may not be returned until the following business day.  We are closed weekends and major holidays. You have access to a nurse at all times for urgent questions. Please call the main number to the clinic Dept: [phone] and follow the prompts.   For any non-urgent questions, you may also contact your provider using MyChart. We now offer e-Visits for anyone 20 and older to request care online for non-urgent symptoms. For details visit [email or web address].   Also download the MyChart app! Go to the app store, search MyChart, open the app, select [facility], and log in with your MyChart username and password.

## 2023-11-29 ENCOUNTER — Inpatient Hospital Stay: Attending: Physician Assistant

## 2023-11-29 VITALS — BP 130/84 | HR 81 | Temp 98.6°F | Resp 16

## 2023-11-29 DIAGNOSIS — R112 Nausea with vomiting, unspecified: Secondary | ICD-10-CM | POA: Insufficient documentation

## 2023-11-29 DIAGNOSIS — Z5112 Encounter for antineoplastic immunotherapy: Secondary | ICD-10-CM | POA: Insufficient documentation

## 2023-11-29 DIAGNOSIS — E876 Hypokalemia: Secondary | ICD-10-CM | POA: Insufficient documentation

## 2023-11-29 DIAGNOSIS — C162 Malignant neoplasm of body of stomach: Secondary | ICD-10-CM

## 2023-11-29 DIAGNOSIS — N131 Hydronephrosis with ureteral stricture, not elsewhere classified: Secondary | ICD-10-CM | POA: Insufficient documentation

## 2023-11-29 DIAGNOSIS — Z95828 Presence of other vascular implants and grafts: Secondary | ICD-10-CM

## 2023-11-29 DIAGNOSIS — C169 Malignant neoplasm of stomach, unspecified: Secondary | ICD-10-CM | POA: Insufficient documentation

## 2023-11-29 DIAGNOSIS — C786 Secondary malignant neoplasm of retroperitoneum and peritoneum: Secondary | ICD-10-CM | POA: Insufficient documentation

## 2023-11-29 DIAGNOSIS — Z79633 Long term (current) use of mitotic inhibitor: Secondary | ICD-10-CM | POA: Insufficient documentation

## 2023-11-29 DIAGNOSIS — Z5111 Encounter for antineoplastic chemotherapy: Secondary | ICD-10-CM | POA: Insufficient documentation

## 2023-11-29 MED ORDER — SODIUM CHLORIDE 0.9% FLUSH
10.0000 mL | Freq: Once | INTRAVENOUS | Status: AC
Start: 2023-11-29 — End: 2023-11-29
  Administered 2023-11-29: 10 mL

## 2023-11-29 MED ORDER — HEPARIN SOD (PORK) LOCK FLUSH 100 UNIT/ML IV SOLN
500.0000 [IU] | Freq: Once | INTRAVENOUS | Status: DC
Start: 2023-11-29 — End: 2023-11-29

## 2023-12-02 LAB — SIGNATERA
SIGNATERA MTM READOUT: 23.79 MTM/ml — AB
SIGNATERA TEST RESULT: POSITIVE — AB

## 2023-12-06 ENCOUNTER — Ambulatory Visit (HOSPITAL_COMMUNITY)
Admission: RE | Admit: 2023-12-06 | Discharge: 2023-12-06 | Disposition: A | Discharge status: Home / Self Care | Source: Ambulatory Visit | Attending: Hematology | Admitting: Hematology

## 2023-12-06 DIAGNOSIS — C162 Malignant neoplasm of body of stomach: Secondary | ICD-10-CM | POA: Insufficient documentation

## 2023-12-06 LAB — GLUCOSE, CAPILLARY: Glucose-Capillary: 133 mg/dL — ABNORMAL HIGH (ref 70–99)

## 2023-12-06 MED ORDER — FLUDEOXYGLUCOSE F - 18 (FDG) INJECTION
8.5700 | Freq: Once | INTRAVENOUS | Status: AC | PRN
  Administered 2023-12-06: 8.57 via INTRAVENOUS

## 2023-12-09 ENCOUNTER — Encounter (HOSPITAL_COMMUNITY): Payer: Self-pay | Admitting: Hematology

## 2023-12-09 ENCOUNTER — Other Ambulatory Visit: Payer: Self-pay | Admitting: Hematology

## 2023-12-09 ENCOUNTER — Other Ambulatory Visit: Payer: Self-pay | Admitting: Nurse Practitioner

## 2023-12-10 ENCOUNTER — Other Ambulatory Visit: Payer: Self-pay

## 2023-12-10 ENCOUNTER — Other Ambulatory Visit (HOSPITAL_COMMUNITY): Payer: Self-pay

## 2023-12-10 ENCOUNTER — Other Ambulatory Visit (HOSPITAL_BASED_OUTPATIENT_CLINIC_OR_DEPARTMENT_OTHER): Payer: Self-pay

## 2023-12-10 MED ORDER — FAMOTIDINE 20 MG PO TABS
20.0000 mg | ORAL_TABLET | Freq: Two times a day (BID) | ORAL | 2 refills | Status: DC
  Filled 2023-12-10: qty 60, 30d supply, fill #0
  Filled 2024-01-22: qty 60, 30d supply, fill #1
  Filled 2024-02-23: qty 60, 30d supply, fill #2

## 2023-12-10 MED ORDER — POTASSIUM CHLORIDE CRYS ER 20 MEQ PO TBCR
EXTENDED_RELEASE_TABLET | ORAL | 0 refills | Status: DC
  Filled 2023-12-10: qty 80, 30d supply, fill #0

## 2023-12-10 MED FILL — Fosaprepitant Dimeglumine For IV Infusion 150 MG (Base Eq): INTRAVENOUS | Qty: 5 | Status: AC

## 2023-12-11 ENCOUNTER — Inpatient Hospital Stay

## 2023-12-11 ENCOUNTER — Other Ambulatory Visit: Payer: Self-pay

## 2023-12-11 ENCOUNTER — Inpatient Hospital Stay: Admitting: Dietician

## 2023-12-11 ENCOUNTER — Inpatient Hospital Stay: Admitting: Hematology

## 2023-12-11 ENCOUNTER — Encounter: Payer: Self-pay | Admitting: Hematology

## 2023-12-11 VITALS — BP 124/84 | HR 92 | Temp 97.3°F | Resp 15 | Ht 68.0 in | Wt 166.1 lb

## 2023-12-11 VITALS — BP 144/96 | HR 90 | Resp 18

## 2023-12-11 DIAGNOSIS — C786 Secondary malignant neoplasm of retroperitoneum and peritoneum: Secondary | ICD-10-CM | POA: Diagnosis not present

## 2023-12-11 DIAGNOSIS — Z5111 Encounter for antineoplastic chemotherapy: Secondary | ICD-10-CM | POA: Diagnosis present

## 2023-12-11 DIAGNOSIS — N131 Hydronephrosis with ureteral stricture, not elsewhere classified: Secondary | ICD-10-CM | POA: Diagnosis not present

## 2023-12-11 DIAGNOSIS — C162 Malignant neoplasm of body of stomach: Secondary | ICD-10-CM

## 2023-12-11 DIAGNOSIS — R112 Nausea with vomiting, unspecified: Secondary | ICD-10-CM | POA: Diagnosis not present

## 2023-12-11 DIAGNOSIS — C169 Malignant neoplasm of stomach, unspecified: Secondary | ICD-10-CM | POA: Diagnosis not present

## 2023-12-11 DIAGNOSIS — Z95828 Presence of other vascular implants and grafts: Secondary | ICD-10-CM

## 2023-12-11 DIAGNOSIS — Z5112 Encounter for antineoplastic immunotherapy: Secondary | ICD-10-CM | POA: Diagnosis present

## 2023-12-11 DIAGNOSIS — Z79633 Long term (current) use of mitotic inhibitor: Secondary | ICD-10-CM | POA: Diagnosis not present

## 2023-12-11 DIAGNOSIS — E876 Hypokalemia: Secondary | ICD-10-CM | POA: Diagnosis not present

## 2023-12-11 LAB — CMP (CANCER CENTER ONLY)
ALT: 11 U/L (ref 0–44)
AST: 16 U/L (ref 15–41)
Albumin: 3.7 g/dL (ref 3.5–5.0)
Alkaline Phosphatase: 83 U/L (ref 38–126)
Anion gap: 6 (ref 5–15)
BUN: 7 mg/dL (ref 6–20)
CO2: 29 mmol/L (ref 22–32)
Calcium: 8.7 mg/dL — ABNORMAL LOW (ref 8.9–10.3)
Chloride: 101 mmol/L (ref 98–111)
Creatinine: 0.59 mg/dL (ref 0.44–1.00)
GFR, Estimated: >60 mL/min (ref >60)
Glucose, Bld: 156 mg/dL — ABNORMAL HIGH (ref 70–99)
Potassium: 3.7 mmol/L (ref 3.5–5.1)
Sodium: 136 mmol/L (ref 135–145)
Total Bilirubin: 0.4 mg/dL (ref 0.0–1.2)
Total Protein: 6.1 g/dL — ABNORMAL LOW (ref 6.5–8.1)

## 2023-12-11 LAB — CBC WITH DIFFERENTIAL (CANCER CENTER ONLY)
Abs Immature Granulocytes: 0.01 K/uL (ref 0.00–0.07)
Basophils Absolute: 0 K/uL (ref 0.0–0.1)
Basophils Relative: 1 %
Eosinophils Absolute: 0.2 K/uL (ref 0.0–0.5)
Eosinophils Relative: 7 %
HCT: 37.9 % (ref 36.0–46.0)
Hemoglobin: 13.5 g/dL (ref 12.0–15.0)
Immature Granulocytes: 0 %
Lymphocytes Relative: 25 %
Lymphs Abs: 0.7 K/uL (ref 0.7–4.0)
MCH: 29.3 pg (ref 26.0–34.0)
MCHC: 35.6 g/dL (ref 30.0–36.0)
MCV: 82.4 fL (ref 80.0–100.0)
Monocytes Absolute: 0.4 K/uL (ref 0.1–1.0)
Monocytes Relative: 12 %
Neutro Abs: 1.6 K/uL — ABNORMAL LOW (ref 1.7–7.7)
Neutrophils Relative %: 55 %
Platelet Count: 300 K/uL (ref 150–400)
RBC: 4.6 MIL/uL (ref 3.87–5.11)
RDW: 14.6 % (ref 11.5–15.5)
WBC Count: 2.9 K/uL — ABNORMAL LOW (ref 4.0–10.5)
nRBC: 0 % (ref 0.0–0.2)

## 2023-12-11 MED ORDER — SODIUM CHLORIDE 0.9 % IV SOLN: Freq: Once | INTRAVENOUS | Status: DC

## 2023-12-11 MED ORDER — HEPARIN SOD (PORK) LOCK FLUSH 100 UNIT/ML IV SOLN: 500.0000 [IU] | Freq: Once | INTRAVENOUS | Status: DC | PRN

## 2023-12-11 MED ORDER — SODIUM CHLORIDE 0.9% FLUSH: 10.0000 mL | Freq: Once | INTRAVENOUS | Status: DC | PRN

## 2023-12-11 MED ORDER — SODIUM CHLORIDE 0.9 % IV SOLN: Freq: Once | INTRAVENOUS | Status: AC

## 2023-12-11 MED ORDER — ALTEPLASE 2 MG IJ SOLR
2.0000 mg | Freq: Once | INTRAMUSCULAR | Status: DC | PRN
Start: 2023-12-11 — End: 2023-12-11

## 2023-12-11 MED ORDER — ONDANSETRON HCL 4 MG/2ML IJ SOLN
8.0000 mg | Freq: Once | INTRAMUSCULAR | Status: AC
Start: 2023-12-11 — End: 2023-12-11
  Administered 2023-12-11 (×2): 8 mg via INTRAVENOUS
  Filled 2023-12-11: qty 4

## 2023-12-11 MED ORDER — SODIUM CHLORIDE 0.9 % IV SOLN
25.0000 mg | Freq: Once | INTRAVENOUS | Status: AC
  Administered 2023-12-11 (×2): 25 mg via INTRAVENOUS
  Filled 2023-12-11: qty 1

## 2023-12-11 MED ORDER — HEPARIN SOD (PORK) LOCK FLUSH 100 UNIT/ML IV SOLN: 250.0000 [IU] | Freq: Once | INTRAVENOUS | Status: DC | PRN

## 2023-12-11 MED ORDER — SODIUM CHLORIDE 0.9% FLUSH: 3.0000 mL | Freq: Once | INTRAVENOUS | Status: DC | PRN

## 2023-12-11 MED ORDER — SODIUM CHLORIDE 0.9% FLUSH
10.0000 mL | Freq: Once | INTRAVENOUS | Status: AC
Start: 2023-12-11 — End: 2023-12-11
  Administered 2023-12-11 (×2): 10 mL

## 2023-12-11 NOTE — Assessment & Plan Note (Addendum)
 [id number] with peritoneal metastasis. 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   -she started first line chemo FLOT on 1/10 -She understands that chemotherapy is palliative, to prolong her life.  We are unlikely going to cure her cancer. -PD-L1 0-1%, no significant benefit from PD-L1 immunotherapy, FO revealed FGFR2 amplification and fusion (+), FGFR inhibitors can be considered in future, no other targeted therapy available  -She has been tolerating chemo very well, will continue for now  -PET scan from 05/30/2022 was negative for primary tumor or metastatic disease, the known peritoneal mets did not show on PET.  -she has been tolerating chemo well overall. Due to fatigue, I have changed her chemo from FLOT to FOLFOX on 06/20/2022, she tolerated well -she previously asked the role of surgery, depends on her next restaging CT scan findings, I may refer her to [facility] or [facility] to discuss HIPEC surgery  -due to her infusion reaction to oxaliplatin  on C5, we added additional premeds and gave slow infusion over 4 hours for cycle 6 and she tolerated well  -She is not able to return to work due to the cancer and treatment related symptoms.  -She is tolerating FOLFOX well overall, with moderate fatigue for a few days after infusion but able to recover well.  No signs of neuropathy at this point. -Restaging CT abdomen pelvis from August 27, 2022 showed no residual disease.  -We again discussed maintenance therapy with Xeloda  down the road, we will stop oxaliplatin  when she develops side effects especially neuropathy, or after next scan -repeated staging CT from 11/26/2021 showed stable disease  -I have changed her treatment to maintenance Xeloda  in early August 2024, she is tolerating well overall  -her NGS Caris showed positive Claudin  18.2, she is a candidate for zolbetuximab.  -Repeated EGD on March 21, 2023 showed residual gastric cancer.  We discussed option of changing her chemotherapy back to FOLFOX and add zolbetuximab.  -PET 06/03/2023 showed stable disease (no hypermetabolic disease outside stomach) -Patient developed recurrent abdominal pain, similar to the symptoms she had when she was diagnosed.  She agreed to change treatment back to FOLFOX, and add Zolbetuximab. She started on 07/09/2023. She tolerated first cycle poorly and had prolonged recovery. Oxaliplatin  was stop after cycle 1. She continued 5-fu/LV and zolbe every 2 weeks  -CT 10/10/2023 showed stable disease - Unfortunately PET scan on December 06, 2023 showed worsening peritoneal metastasis.  I recommend change treatment to paclitaxel  and ramucirumab 

## 2023-12-11 NOTE — Progress Notes (Signed)
 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   -she started first line chemo FLOT on 1/10 -She understands that chemotherapy is palliative, to prolong her life.  We are unlikely going to cure her cancer. -PD-L1 0-1%, no significant benefit from PD-L1 immunotherapy, FO revealed FGFR2 amplification and fusion (+), FGFR inhibitors can be considered in future, no other targeted therapy available  -She has been tolerating chemo very well, will continue for now  -PET scan from 05/30/2022 was negative for primary tumor or metastatic disease, the known peritoneal mets did not show on PET.  -she has been tolerating chemo well overall. Due to fatigue, I have changed her chemo from FLOT to FOLFOX on 06/20/2022, she tolerated well -she previously asked the role of surgery, depends on her next restaging CT scan findings, I may refer her to [facility] or [facility] to discuss HIPEC surgery  -due to her infusion reaction to oxaliplatin  on C5, we added additional premeds and gave slow infusion over 4 hours for cycle 6 and she tolerated well  -She is not able to return to work due to the cancer and treatment related symptoms.  -She is tolerating FOLFOX well overall, with moderate fatigue for a few days after infusion but able to recover well.  No signs of neuropathy at this point. -Restaging CT abdomen  pelvis from August 27, 2022 showed no residual disease.  -We again discussed maintenance therapy with Xeloda  down the road, we will stop oxaliplatin  when she develops side effects especially neuropathy, or after next scan -repeated staging CT from 11/26/2021 showed stable disease  -I have changed her treatment to maintenance Xeloda  in early August 2024, she is tolerating well overall  -her NGS Caris showed positive Claudin 18.2, she is a candidate for zolbetuximab.  -Repeated EGD on March 21, 2023 showed residual gastric cancer.  We discussed option of changing her chemotherapy back to FOLFOX and add zolbetuximab.  -PET 06/03/2023 showed stable disease (no hypermetabolic disease outside stomach) -Patient developed recurrent abdominal pain, similar to the symptoms she had when she was diagnosed.  She agreed to change treatment back to FOLFOX, and add Zolbetuximab. She started on 07/09/2023. She tolerated first cycle poorly and had prolonged recovery. Oxaliplatin  was stop after cycle 1. She continued 5-fu/LV and zolbe every 2 weeks  -CT 10/10/2023 showed stable disease - Unfortunately PET scan on December 06, 2023 showed worsening peritoneal metastasis.  I recommend change treatment to paclitaxel and ramucirumab  Assessment & Plan Metastatic gastric cancer with peritoneal metastases Progression of metastatic gastric cancer with increased uptake in the stomach and new nodules in the peritoneum. The disease remains in the stomach and peritoneum but shows increased activity and size of nodules. Previous treatment is no longer effective due to tumor resistance, necessitating a change in therapy. The new treatment regimen includes paclitaxel and romosozumab, which is anticipated to control the disease but not cure it. Approximately two-thirds of patients benefit from this  regimen. 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 - Discontinue current antibody treatment. - Initiate new treatment  regimen with paclitaxel and romosozumab starting next week, pending insurance approval. - Administer paclitaxel once a week for three weeks on, one week off. - Administer romosozumab during the first and third weeks of the cycle. - Repeat PET scan in three months to assess treatment response.  weight loss and poor oral intake Continued weight loss and poor oral intake due to discomfort and pain when eating solid foods. Current intake includes limited soft foods and nutritional supplements. - Encourage increased intake of Glycine nutritional supplement to prevent further weight loss. - Explore other soft or liquid nutritional options that can be tolerated.  Intermittent nausea and vomiting Persistent nausea and vomiting, likely exacerbated by current antibody treatment. Anticipated reduction in symptoms with discontinuation of current antibody therapy. - Discontinue current antibody treatment to potentially reduce nausea. - Provide IV fluids today for hydration.  Cancer-related abdominal pain Abdominal pain associated with eating, likely due to both cancer progression and current antibody treatment. Anticipated partial relief with change in treatment regimen. - Discontinue current antibody treatment to potentially reduce abdominal pain.  Plan -PET scan reviewed, unfortunately showed disease progression - Will change her treatment to paclitaxel and ramucirumab next week - IV fluids today    SUMMARY OF ONCOLOGIC HISTORY: Oncology History Overview Note   Cancer Staging  Gastric cancer [facility]) Staging form: Stomach, AJCC 8th Edition - Clinical stage from 04/19/2022: Stage IVB (cT2, cN0, pM1) - Signed by Sheryl Callander, MD on 05/08/2022 Total positive nodes: 0     Gastric cancer (HCC)  03/30/2022 Procedure   EGD:  Impression:  - Normal esophagus. - A few gastric polyps. Biopsied. - Gastritis. Biopsied. - Non-bleeding gastric ulcer with no stigmata of bleeding. Biopsied. - Normal examined  duodenum. Biopsied.  Findings: Diffuse moderate inflammation characterized by congestion (edema), friability and granularity was found in the cardia, in the gastric fundus and in the gastric body. There were associated erosions in multiple places. Biopsies were taken from the antrum, body, and fundus with a cold forceps for histology. Estimated blood loss was minimal.  One non-bleeding cratered gastric ulcer with no stigmata of bleeding was found on the greater curvature of the stomach. The lesion was 6 mm in largest dimension. The mucosa around the ulcer was heaped and led to some deformity in the antrum. Biopsies were taken with a cold forceps for histology. Estimated blood loss was minimal.    03/30/2022 Pathology Results   Patient: Gonnella, Lura P  Accession: [id number]  Diagnosis 1. Surgical [P], duodenal - BENIGN SMALL BOWEL MUCOSA WITH NO SIGNIFICANT PATHOLOGIC CHANGES 2. Surgical [P], gastric antrum - GASTRIC ANTRAL MUCOSA WITH FEATURES OF REACTIVE GASTROPATHY - NEGATIVE FOR H. PYLORI ON H&E STAIN - NEGATIVE FOR INTESTINAL METAPLASIA OR MALIGNANCY 3. Surgical [P], gastric body - GASTRIC OXYNTIC MUCOSA WITH REACTIVE/REPARATIVE CHANGES - NEGATIVE FOR H. PYLORI ON H&E STAIN - NEGATIVE FOR INTESTINAL METAPLASIA, DYSPLASIA OR MALIGNANCY 4. Surgical [P], greater curve ulceration - ADENOCARCINOMA WITH SIGNET RING CELL FEATURES (SEE NOTE) 5. Surgical [P], gastric polyps - ADENOCARCINOMA WITH SIGNET RING CELL FEATURES (SEE NOTE) 6. Surgical [P], fundus (gastric) - ADENOCARCINOMA WITH SIGNET RING CELL FEATURES (SEE NOTE) 7. Surgical [P], colon, ascending, polyp (1) - TUBULAR ADENOMA. - NO HIGH GRADE DYSPLASIA OR MALIGNANCY. 8. Surgical [P], colon, transverse, polyp (1) - TUBULAR ADENOMA. - NO HIGH GRADE DYSPLASIA OR MALIGNANCY.    04/13/2022 Initial Diagnosis  Gastric cancer (HCC)   04/19/2022 Cancer Staging   Staging form: Stomach, AJCC 8th Edition - Clinical stage  from 04/19/2022: Stage IVB (cT2, cN0, pM1) - Signed by Sheryl Callander, MD on 05/08/2022 Total positive nodes: 0   05/05/2022 Genetic Testing   Negative genetic testing on the Multi-cancer gene panel + RNA.  FH c.259C>T VUS identified.  The report date is May 05, 2022.  The Multi-Cancer + RNA Panel offered by [facility] includes sequencing and/or deletion/duplication analysis of the following 70 genes:  AIP*, ALK, APC*, ATM*, AXIN2*, BAP1*, BARD1*, BLM*, BMPR1A*, BRCA1*, BRCA2*, BRIP1*, CDC73*, CDH1*, CDK4, CDKN1B*, CDKN2A, CHEK2*, CTNNA1*, DICER1*, EPCAM (del/dup only), EGFR, FH*, FLCN*, GREM1 (promoter dup only), HOXB13, KIT, LZTR1, MAX*, MBD4, MEN1*, MET, MITF, MLH1*, MSH2*, MSH3*, MSH6*, MUTYH*, NF1*, NF2*, NTHL1*, PALB2*, PDGFRA, PMS2*, POLD1*, POLE*, POT1*, PRKAR1A*, PTCH1*, PTEN*, RAD51C*, RAD51D*, RB1*, RET, SDHA* (sequencing only), SDHAF2*, SDHB*, SDHC*, SDHD*, SMAD4*, SMARCA4*, SMARCB1*, SMARCE1*, STK11*, SUFU*, TMEM127*, TP53*, TSC1*, TSC2*, VHL*. RNA analysis is performed for * genes.    05/09/2022 - 06/07/2022 Chemotherapy   Patient is on Treatment Plan : GASTROESOPHAGEAL FLOT q14d X 4 cycles      Miscellaneous   Foundation One  Biomarker Findings Microsatellite status- Cannot be determined Tumor Mutational Burden- Cannot be determined  Genomic Findings  FGFR2 amplification,FGFR2-TACC2 fusion,  Rearrangement intron 17 ARAF amplification CCND3 amplification TP53 V255fs*74     05/30/2022 Imaging    IMPRESSION: 1. Mild hypermetabolism corresponding to a dominant left upper quadrant mass and smaller perigastric nodules or nodes. 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 Recommend attention to the dominant left upper quadrant soft tissue mass on follow-up exams to exclude unlikely recurrent lymphoma. 2. No gastric hypermetabolism and no typical findings of metastatic disease.   06/20/2022 - 11/16/2022 Chemotherapy   Patient is on Treatment Plan :  GASTRIC FOLFOX q14d x 12 cycles     08/27/2022 Imaging    IMPRESSION: No focal gastric mass on CT.   No findings suspicious for recurrent or metastatic disease.   Stable left upper abdominal soft tissue lesion and small lymph nodes, chronic, favoring treated lymphoma.   11/27/2022 Imaging    IMPRESSION: 1. Questionable thickening of the distal esophagus/GE junction and gastric antrum, consider further evaluation with endoscopy. 2. Chronically stable left upper quadrant nodularity and prominent lymph nodes again favored treated lymphoma. Continued attention on follow-up imaging suggested. 3. No convincing evidence of metastatic disease in the chest, abdomen or pelvis. 4. Questionable asymmetric wall thickening of the rectum, consider further evaluation with colonoscopy. 5. Mild wall thickening of a nondistended urinary bladder, correlate with urinalysis to exclude cystitis. 6. Hepatic steatosis.   07/10/2023 - 11/27/2023 Chemotherapy   Patient is on Treatment Plan : GASTROESOPHAGEAL Zolbetuximab (800/400) + FOLFOX D1,15,29 q42d x 4 cycles / Zolbetuximab (400) + 5FU + Leucovorin  D1,15,29 q42d        Discussed the use of AI scribe software for clinical note transcription with the patient, who gave verbal consent to proceed.  History of Present Illness Sheryl [name] is a 58 year old female with metastatic gastric cancer who presents for follow-up.  She experiences ongoing weight loss and persistent nausea and vomiting, with episodes occurring every few days, including this morning. She is unable to tolerate solid foods due to stomach pain, described as pain 'right around the mouth' when eating. She can sometimes consume mashed potatoes, soup, and chicken broth, but these are unappealing. She drinks Glycine, about one to  two bottles a day, but this is insufficient given her inability to eat solid foods.  A recent PET scan shows increased uptake in the stomach and new nodules in the  peritoneum. The left upper quadrant nodule measures 1.8 cm, and another nodule has increased in activity from 1.9 to 3.5, with stable size at 3.7 cm. Previously, these nodules were not visible on the PET scan but were known from endoscopic examination.  She experiences hunger pains when not eating, and her current symptoms include nausea and stomach discomfort, potentially related to previous antibody treatment.     All other systems were reviewed with the patient and are negative.  MEDICAL HISTORY:  Past Medical History:  Diagnosis Date   Blood transfusion without reported diagnosis    had transfusion with hysterectomy   Cataract    Colon polyps 2012   Diabetes (HCC) 03/13/2021   Diabetes (HCC) 05/21/2019   Family history of breast cancer    Family history of pancreatic cancer    Family history of stomach cancer    Fibroid    gastric ca 03/2022   GERD (gastroesophageal reflux disease)    H/O blood clots    History of hysterectomy    fibroids and heavy cycles   Hypertension     SURGICAL HISTORY: Past Surgical History:  Procedure Laterality Date   ABDOMINAL HYSTERECTOMY     BIOPSY  04/19/2022   Procedure: BIOPSY;  Surgeon: Wilhelmenia Aloha Raddle., MD;  Location: [facility];  Service: Gastroenterology;;   BIOPSY  03/21/2023   Procedure: BIOPSY;  Surgeon: Wilhelmenia Aloha Raddle., MD;  Location: [facility];  Service: Gastroenterology;;   COLONOSCOPY     ESOPHAGOGASTRODUODENOSCOPY (EGD) WITH PROPOFOL  N/A 04/19/2022   Procedure: ESOPHAGOGASTRODUODENOSCOPY (EGD) WITH PROPOFOL ;  Surgeon: Wilhelmenia Aloha Raddle., MD;  Location: [facility];  Service: Gastroenterology;  Laterality: N/A;   ESOPHAGOGASTRODUODENOSCOPY (EGD) WITH PROPOFOL  N/A 03/21/2023   Procedure: ESOPHAGOGASTRODUODENOSCOPY (EGD) WITH PROPOFOL ;  Surgeon: Wilhelmenia Aloha Raddle., MD;  Location: [facility];  Service: Gastroenterology;  Laterality: N/A;   EUS N/A 04/19/2022   Procedure: UPPER ENDOSCOPIC ULTRASOUND  (EUS) RADIAL;  Surgeon: Wilhelmenia Aloha Raddle., MD;  Location: [facility];  Service: Gastroenterology;  Laterality: N/A;   EXCISION OF SKIN TAG  05/03/2022   Procedure: EXCISION OF CHEST WALL SKIN LESION;  Surgeon: Dasie Leonor CROME, MD;  Location: [facility];  Service: General;;   LAPAROSCOPY N/A 05/03/2022   Procedure: LAPAROSCOPY DIAGNOSTIC WITH PERITONEAL WASHINGS;  Surgeon: Dasie Leonor CROME, MD;  Location: [facility];  Service: General;  Laterality: N/A;   POLYPECTOMY  04/19/2022   Procedure: POLYPECTOMY;  Surgeon: Wilhelmenia Aloha Raddle., MD;  Location: [facility];  Service: Gastroenterology;;   PORTACATH PLACEMENT N/A 05/03/2022   Procedure: INSERTION PORT-A-CATH WITH ULTRASOUND GUIDANCE;  Surgeon: Dasie Leonor CROME, MD;  Location: [facility];  Service: General;  Laterality: N/A;   UPPER GASTROINTESTINAL ENDOSCOPY      I have reviewed the social history and family history with the patient and they are unchanged from previous note.  ALLERGIES:  is allergic to aspirin, cyclobenzaprine, naproxen sodium, zithromax [azithromycin dihydrate], oxaliplatin , and dilaudid [hydromorphone].  MEDICATIONS:  Current Outpatient Medications  Medication Sig Dispense Refill   acetaminophen  (TYLENOL ) 500 MG tablet Take 2 tablets (1,000 mg total) by mouth every 8 (eight) hours as needed (pain). 30 tablet 1   amLODipine  (NORVASC ) 10 MG tablet Take 1 tablet (10 mg total) by mouth daily. 30 tablet 2   b complex vitamins capsule Take 1 capsule by mouth daily.  calcium -vitamin D (OSCAL WITH D) 500-5 MG-MCG tablet Take 2 tablets by mouth 2 (two) times daily.     famotidine  (PEPCID ) 20 MG tablet Take 1 tablet (20 mg total) by mouth 2 (two) times daily. 60 tablet 2   HYDROcodone -acetaminophen  (NORCO/VICODIN) 5-325 MG tablet Take 1 tablet by mouth every 6 (six) hours as needed for moderate pain (pain score 4-6). 20 tablet 0   lidocaine -prilocaine  (EMLA ) cream Apply 1 Application topically as needed. 30 g 1   metoCLOPramide  (REGLAN )  10 MG tablet Take 1 tablet (10 mg total) by mouth every 8 (eight) hours as needed for nausea. 60 tablet 1   pantoprazole  (PROTONIX ) 40 MG tablet Take 1 tablet (40 mg total) by mouth daily. 30 tablet 2   potassium chloride  SA (KLOR-CON  M) 20 MEQ tablet Take 2 tablets (40 mEq total) by mouth 2 (two) times daily for 5 days, THEN 1 tablet (20 mEq total) 2 (two) times daily for 25 days. 80 tablet 0   prochlorperazine  (COMPAZINE ) 10 MG tablet Take 1 tablet (10 mg total) by mouth every 6 (six) hours as needed for nausea or vomiting. 30 tablet 2   promethazine  (PHENERGAN ) 50 MG tablet Take 1 tablet (50 mg total) by mouth 2 (two) times daily as needed for nausea or vomiting. 45 tablet 1   scopolamine  (TRANSDERM-SCOP) 1 MG/3DAYS Place 1 patch (1.5 mg total) onto the skin every 3 (three) days. 10 patch 1   sucralfate  (CARAFATE ) 1 g tablet Take 1 tablet (1 g total) by mouth 2 (two) times daily. 60 tablet 6   zinc gluconate 50 MG tablet Take 50 mg by mouth daily.     No current facility-administered medications for this visit.   Facility-Administered Medications Ordered in Other Visits  Medication Dose Route Frequency Provider Last Rate Last Admin   alteplase  (CATHFLO ACTIVASE ) injection 2 mg  2 mg Intracatheter Once PRN Sheryl Callander, MD       heparin  lock flush 100 unit/mL  500 Units Intracatheter Once PRN Sheryl Callander, MD       heparin  lock flush 100 unit/mL  250 Units Intracatheter Once PRN Sheryl Callander, MD       sodium chloride  flush (NS) 0.9 % injection 10 mL  10 mL Intracatheter Once PRN Sheryl Callander, MD       sodium chloride  flush (NS) 0.9 % injection 3 mL  3 mL Intracatheter Once PRN Sheryl Callander, MD        PHYSICAL EXAMINATION: ECOG PERFORMANCE STATUS: 1 - Symptomatic but completely ambulatory  Vitals:   12/11/23 0933  BP: 124/84  Pulse: 92  Resp: 15  Temp: (!) 97.3 F (36.3 C)  SpO2: 100%   Wt Readings from Last 3 Encounters:  12/11/23 166 lb 1.6 oz (75.3 kg)  11/27/23 170 lb 8 oz (77.3 kg)   11/13/23 172 lb (78 kg)     GENERAL:alert, no distress and comfortable SKIN: skin color, texture, turgor are normal, no rashes or significant lesions EYES: normal, Conjunctiva are pink and non-injected, sclera clear Musculoskeletal:no cyanosis of digits and no clubbing  NEURO: alert & oriented x 3 with fluent speech, no focal motor/sensory deficits  Physical Exam    LABORATORY DATA:  I have reviewed the data as listed    Latest Ref Rng & Units 12/11/2023    9:11 AM 11/27/2023    9:07 AM 11/13/2023    9:22 AM  CBC  WBC 4.0 - 10.5 K/uL 2.9  2.9  2.8   Hemoglobin 12.0 -  15.0 g/dL 86.4  86.6  86.4   Hematocrit 36.0 - 46.0 % 37.9  38.3  37.9   Platelets 150 - 400 K/uL 300  291  308         Latest Ref Rng & Units 12/11/2023    9:11 AM 11/27/2023    9:07 AM 11/13/2023    9:22 AM  CMP  Glucose 70 - 99 mg/dL 843  831  876   BUN 6 - 20 mg/dL 7  7  7    Creatinine 0.44 - 1.00 mg/dL 9.40  9.26  9.38   Sodium 135 - 145 mmol/L 136  137  139   Potassium 3.5 - 5.1 mmol/L 3.7  3.3  2.9   Chloride 98 - 111 mmol/L 101  101  99   CO2 22 - 32 mmol/L 29  30  34   Calcium  8.9 - 10.3 mg/dL 8.7  8.9  9.0   Total Protein 6.5 - 8.1 g/dL 6.1  6.3  6.3   Total Bilirubin 0.0 - 1.2 mg/dL 0.4  0.5  0.4   Alkaline Phos 38 - 126 U/L 83  84  83   AST 15 - 41 U/L 16  19  23    ALT 0 - 44 U/L 11  12  14        RADIOGRAPHIC STUDIES: I have personally reviewed the radiological images as listed and agreed with the findings in the report. No results found.    No orders of the defined types were placed in this encounter.  All questions were answered. The patient knows to call the clinic with any problems, questions or concerns. No barriers to learning was detected. The total time spent in the appointment was 40 minutes, including review of chart and various tests results, discussions about plan of care and coordination of care plan     Onita Mattock, MD 12/11/2023

## 2023-12-11 NOTE — Progress Notes (Addendum)
 DISCONTINUE ON PATHWAY REGIMEN - Gastroesophageal     A cycle is every 14 days:     Docetaxel       Oxaliplatin       Leucovorin       Fluorouracil    **Always confirm dose/schedule in your pharmacy ordering system**  PRIOR TREATMENT: [id number]: FLOT q14 Days x 4 Cycles (2 Months) Preoperative, FLOT q14 Days x 4 Cycles (2 Months) Postoperative  CLINICAL TRIAL SELECTED FOR SCREENING - Gastroesophageal  Trial: Randomized Phase II/III Trial of 2nd Line Nivolumab + Paclitaxel + Ramucirumab Versus Paclitaxel + Ramucirumab in Patients With PD-L1 CPS &gt;/= 1 Advanced Gastric and Esophageal Adenocarcinoma (PARAMUNE)  **Trial eligibility and accrual should be confirmed by your research team**  Patient Characteristics: Distant Metastases (cM1/pM1) / Locally Recurrent Disease, Adenocarcinoma - Esophageal, GE Junction, and Gastric, Second Line, MSS/pMMR or MSI Unknown Therapeutic Status: Distant Metastases (No Additional Staging) Histology: Adenocarcinoma Disease Classification: Gastric Line of Therapy: Second Line Microsatellite/Mismatch Repair Status: MSS/pMMR Intent of Therapy: Non-Curative / Palliative Intent, Discussed with Patient

## 2023-12-11 NOTE — Patient Instructions (Signed)

## 2023-12-11 NOTE — Progress Notes (Signed)
 Nutrition Follow-up:  Patient with gastric cancer. She is currently receiving reduced dose Zolbetuximab/5FU + Leucovorin  q42d (start 07/09/23). Regimen discontinued 8/13 due to progression.   Patient receiving IV antiemetics + fluids today.   Met with patient in infusion. Father of pt present at visit. Patient eating small spoonfuls of coke slushy this morning. This taste good and does not make her sick. She reports episode of emesis after eating [place] sandwich just before RD arrived. Patient states she is tired of throwing up. Despite antiemetics + reglan , pt reports solid foods and some liquids (broth) are poorly tolerated. Patient reports she will start new treatment regimen next week.    Medications: reviewed   Labs: glucose 156  Anthropometrics: Ongoing wt loss - 166 lb 1.6 oz today (9% in last 9 weeks - severe)  7/30 - 170 lb 8 oz 7/16 - 172 lb 7/1 - 175 lb 6.4 oz 6/18 - 179 lb 4.8 oz  6/4 - 183 lb 6.4 oz    NUTRITION DIAGNOSIS: Unintended wt loss - continues    INTERVENTION:  Continued encouragement for bites/sips every 2 hours - recommend bland foods for ease of digestion Continue antiemetics + reglan  as prescribed per MD If appropriate, consider placement of GJ tube if N/V persist with change in treatment  Support and encouragement    MONITORING, EVALUATION, GOAL: wt trends, intake    NEXT VISIT: To be scheduled - pending updated treatment plan

## 2023-12-13 ENCOUNTER — Inpatient Hospital Stay

## 2023-12-16 ENCOUNTER — Other Ambulatory Visit: Payer: Self-pay

## 2023-12-16 DIAGNOSIS — C162 Malignant neoplasm of body of stomach: Secondary | ICD-10-CM

## 2023-12-17 ENCOUNTER — Inpatient Hospital Stay

## 2023-12-17 ENCOUNTER — Encounter: Payer: Self-pay | Admitting: Hematology

## 2023-12-17 ENCOUNTER — Inpatient Hospital Stay (HOSPITAL_BASED_OUTPATIENT_CLINIC_OR_DEPARTMENT_OTHER): Admitting: Hematology

## 2023-12-17 VITALS — BP 140/107 | HR 103 | Temp 97.9°F | Resp 17 | Ht 68.0 in | Wt 164.2 lb

## 2023-12-17 VITALS — BP 128/87 | HR 95 | Temp 98.8°F | Resp 18

## 2023-12-17 DIAGNOSIS — Z5112 Encounter for antineoplastic immunotherapy: Secondary | ICD-10-CM | POA: Diagnosis not present

## 2023-12-17 DIAGNOSIS — C162 Malignant neoplasm of body of stomach: Secondary | ICD-10-CM

## 2023-12-17 DIAGNOSIS — Z95828 Presence of other vascular implants and grafts: Secondary | ICD-10-CM

## 2023-12-17 LAB — CMP (CANCER CENTER ONLY)
ALT: 9 U/L (ref 0–44)
AST: 17 U/L (ref 15–41)
Albumin: 3.7 g/dL (ref 3.5–5.0)
Alkaline Phosphatase: 85 U/L (ref 38–126)
Anion gap: 7 (ref 5–15)
BUN: 6 mg/dL (ref 6–20)
CO2: 31 mmol/L (ref 22–32)
Calcium: 9 mg/dL (ref 8.9–10.3)
Chloride: 100 mmol/L (ref 98–111)
Creatinine: 0.7 mg/dL (ref 0.44–1.00)
GFR, Estimated: >60 mL/min (ref >60)
Glucose, Bld: 137 mg/dL — ABNORMAL HIGH (ref 70–99)
Potassium: 3.4 mmol/L — ABNORMAL LOW (ref 3.5–5.1)
Sodium: 138 mmol/L (ref 135–145)
Total Bilirubin: 0.4 mg/dL (ref 0.0–1.2)
Total Protein: 6.3 g/dL — ABNORMAL LOW (ref 6.5–8.1)

## 2023-12-17 LAB — CBC WITH DIFFERENTIAL (CANCER CENTER ONLY)
Abs Immature Granulocytes: 0.01 K/uL (ref 0.00–0.07)
Basophils Absolute: 0 K/uL (ref 0.0–0.1)
Basophils Relative: 1 %
Eosinophils Absolute: 0.2 K/uL (ref 0.0–0.5)
Eosinophils Relative: 6 %
HCT: 39.7 % (ref 36.0–46.0)
Hemoglobin: 14 g/dL (ref 12.0–15.0)
Immature Granulocytes: 0 %
Lymphocytes Relative: 20 %
Lymphs Abs: 0.6 K/uL — ABNORMAL LOW (ref 0.7–4.0)
MCH: 29.5 pg (ref 26.0–34.0)
MCHC: 35.3 g/dL (ref 30.0–36.0)
MCV: 83.8 fL (ref 80.0–100.0)
Monocytes Absolute: 0.5 K/uL (ref 0.1–1.0)
Monocytes Relative: 17 %
Neutro Abs: 1.8 K/uL (ref 1.7–7.7)
Neutrophils Relative %: 56 %
Platelet Count: 404 K/uL — ABNORMAL HIGH (ref 150–400)
RBC: 4.74 MIL/uL (ref 3.87–5.11)
RDW: 14 % (ref 11.5–15.5)
WBC Count: 3.2 K/uL — ABNORMAL LOW (ref 4.0–10.5)
nRBC: 0 % (ref 0.0–0.2)

## 2023-12-17 LAB — TSH: TSH: 1.31 u[IU]/mL (ref 0.350–4.500)

## 2023-12-17 LAB — TOTAL PROTEIN, URINE DIPSTICK: Protein, ur: NEGATIVE mg/dL

## 2023-12-17 MED ORDER — FAMOTIDINE IN NACL 20-0.9 MG/50ML-% IV SOLN
20.0000 mg | Freq: Once | INTRAVENOUS | Status: AC
  Administered 2023-12-17: 20 mg via INTRAVENOUS
  Filled 2023-12-17: qty 50

## 2023-12-17 MED ORDER — DIPHENHYDRAMINE HCL 50 MG/ML IJ SOLN
25.0000 mg | Freq: Once | INTRAMUSCULAR | Status: AC
  Administered 2023-12-17: 25 mg via INTRAVENOUS
  Filled 2023-12-17: qty 1

## 2023-12-17 MED ORDER — SODIUM CHLORIDE 0.9% FLUSH
10.0000 mL | INTRAVENOUS | Status: DC | PRN
  Administered 2023-12-17: 10 mL

## 2023-12-17 MED ORDER — SODIUM CHLORIDE 0.9 % IV SOLN
8.0000 mg/kg | Freq: Once | INTRAVENOUS | Status: AC
  Administered 2023-12-17: 600 mg via INTRAVENOUS
  Filled 2023-12-17: qty 10

## 2023-12-17 MED ORDER — DEXAMETHASONE SODIUM PHOSPHATE 10 MG/ML IJ SOLN
10.0000 mg | Freq: Once | INTRAMUSCULAR | Status: AC
  Administered 2023-12-17: 10 mg via INTRAVENOUS
  Filled 2023-12-17: qty 1

## 2023-12-17 MED ORDER — ACETAMINOPHEN 325 MG PO TABS
650.0000 mg | ORAL_TABLET | Freq: Once | ORAL | Status: AC
  Administered 2023-12-17: 650 mg via ORAL
  Filled 2023-12-17: qty 2

## 2023-12-17 MED ORDER — SODIUM CHLORIDE 0.9% FLUSH
10.0000 mL | Freq: Once | INTRAVENOUS | Status: AC
  Administered 2023-12-17: 10 mL

## 2023-12-17 MED ORDER — SODIUM CHLORIDE 0.9 % IV SOLN: INTRAVENOUS | Status: DC

## 2023-12-17 MED ORDER — SODIUM CHLORIDE 0.9 % IV SOLN
80.0000 mg/m2 | Freq: Once | INTRAVENOUS | Status: AC
  Administered 2023-12-17: 150 mg via INTRAVENOUS
  Filled 2023-12-17: qty 25

## 2023-12-17 NOTE — Progress Notes (Signed)
 [facility]   Telephone:(336) [phone] Fax:(336) [phone]   Clinic Follow up Note   Patient Care Team: [name], MD as PCP - General (Internal Medicine) [name], MD as Consulting Physician (Oncology)  Date of Service:  12/17/2023  CHIEF COMPLAINT: f/u of gastric cancer  CURRENT THERAPY:  Second line paclitaxel  and ramucirumab   Oncology History   Gastric cancer (HCC) cT2N0M1 with peritoneal metastasis. 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   -she started first line chemo FLOT on 05/09/22 -She understands that chemotherapy is palliative, to prolong her life.  We are unlikely going to cure her cancer. -PD-L1 0-1%, very limited benefit from PD-L1 immunotherapy, FO revealed FGFR2 amplification and fusion (+), FGFR inhibitors can be considered in future, no other targeted therapy available  -I changed her chemo from FLOT to FOLFOX on 06/20/2022 -She is not able to return to work due to the cancer and treatment related symptoms.  -I subsequently changed her treatment to maintenance Xeloda  in early August 2024, she is tolerating well overall  -her NGS Caris showed positive Claudin 18.2, she is a candidate for zolbetuximab.  -Repeated EGD on March 21, 2023 showed residual gastric cancer.  We discussed option of changing her chemotherapy back to FOLFOX and add zolbetuximab.  -PET 06/03/2023 showed stable disease (no hypermetabolic disease outside stomach) -Patient developed recurrent abdominal pain, similar to the symptoms she had when she was diagnosed.  She agreed to change treatment back to FOLFOX, and add Zolbetuximab. She started on 07/09/2023. She tolerated first cycle poorly and had prolonged recovery. Oxaliplatin  was stop after cycle 1 due to poor tolerance. She continued 5-fu/LV and zolbe  every 2 weeks  -CT 10/10/2023 showed stable disease - Unfortunately PET scan on December 06, 2023 showed worsening peritoneal metastasis.  I recommend change treatment to paclitaxel  and ramucirumab , will start on 12/17/23 Assessment & Plan Gastric cancer with peritoneal metastasis Mild progression of the primary gastric tumor with progression of peritoneal metastasis. PET scan from August 8th shows increased SUV, indicating progression. Current treatment with 5FU and antibody shows resistance. - Proceed with paclitaxel  and ramucirumab  chemotherapy regimen. - Consider adding nivolumab due to PD-L1 positivity (1%), pending insurance approval. Potential benefits include improved disease control and survival by 3-4 months. Discussed risks include immune-related adverse effects such as lung inflammation (10-15%), liver enzyme elevation, and rare cardiac events. - Provide educational materials on nivolumab for review. - Monitor for immune-related adverse effects.  Right hydronephrosis secondary to malignancy Mild ureteral obstruction causing urine buildup in the kidney, likely related to cancer. No kidney function impairment as tests are normal. Occasional discomfort during urination, likely related to peritoneal metastasis. - Monitor kidney function and symptoms related to hydronephrosis.  Chemotherapy-induced peripheral neuropathy Neuropathy is a known side effect of current chemotherapy regimen. Previous treatment with oxaliplatin  caused neuropathy and cold sensitivity. Current regimen may cause neuropathy but not cold sensitivity. - Use ice packs on hands and feet during chemotherapy infusion to reduce neuropathy risk.  Hypokalemia Potassium level is slightly low at 3.4 mmol/L. - Encourage consumption of high-potassium foods such as bananas and oranges.  Plan - Patient and her son had several questions about the new chemotherapy paclitaxel  and ramucirumab , I answered all -Lab reviewed, adequate for  treatment, will proceed with cycle 1 day 1 treatment today - I also discussed limited benefit of nivolumab,  based on her previous PD-L1 test with CPS 0-1%.  I gave her written material.  She will think about it. - Follow-up in 1 week with cycle 1 day 8 treatment.   SUMMARY OF ONCOLOGIC HISTORY: Oncology History Overview Note   Cancer Staging  Gastric cancer [facility]) Staging form: Stomach, AJCC 8th Edition - Clinical stage from 04/19/2022: Stage IVB (cT2, cN0, pM1) - Signed by [name], MD on 05/08/2022 Total positive nodes: 0     Gastric cancer (HCC)  03/30/2022 Procedure   EGD:  Impression:  - Normal esophagus. - A few gastric polyps. Biopsied. - Gastritis. Biopsied. - Non-bleeding gastric ulcer with no stigmata of bleeding. Biopsied. - Normal examined duodenum. Biopsied.  Findings: Diffuse moderate inflammation characterized by congestion (edema), friability and granularity was found in the cardia, in the gastric fundus and in the gastric body. There were associated erosions in multiple places. Biopsies were taken from the antrum, body, and fundus with a cold forceps for histology. Estimated blood loss was minimal.  One non-bleeding cratered gastric ulcer with no stigmata of bleeding was found on the greater curvature of the stomach. The lesion was 6 mm in largest dimension. The mucosa around the ulcer was heaped and led to some deformity in the antrum. Biopsies were taken with a cold forceps for histology. Estimated blood loss was minimal.    03/30/2022 Pathology Results   Patient: [name], [name]  Diagnosis 1. Surgical [P], duodenal - BENIGN SMALL BOWEL MUCOSA WITH NO SIGNIFICANT PATHOLOGIC CHANGES 2. Surgical [P], gastric antrum - GASTRIC ANTRAL MUCOSA WITH FEATURES OF REACTIVE GASTROPATHY - NEGATIVE FOR H. PYLORI ON H&E STAIN - NEGATIVE FOR INTESTINAL METAPLASIA OR MALIGNANCY 3. Surgical [P], gastric body - GASTRIC OXYNTIC MUCOSA WITH  REACTIVE/REPARATIVE CHANGES - NEGATIVE FOR H. PYLORI ON H&E STAIN - NEGATIVE FOR INTESTINAL METAPLASIA, DYSPLASIA OR MALIGNANCY 4. Surgical [P], greater curve ulceration - ADENOCARCINOMA WITH SIGNET RING CELL FEATURES (SEE NOTE) 5. Surgical [P], gastric polyps - ADENOCARCINOMA WITH SIGNET RING CELL FEATURES (SEE NOTE) 6. Surgical [P], fundus (gastric) - ADENOCARCINOMA WITH SIGNET RING CELL FEATURES (SEE NOTE) 7. Surgical [P], colon, ascending, polyp (1) - TUBULAR ADENOMA. - NO HIGH GRADE DYSPLASIA OR MALIGNANCY. 8. Surgical [P], colon, transverse, polyp (1) - TUBULAR ADENOMA. - NO HIGH GRADE DYSPLASIA OR MALIGNANCY.    04/13/2022 Initial Diagnosis   Gastric cancer (HCC)   04/19/2022 Cancer Staging   Staging form: Stomach, AJCC 8th Edition - Clinical stage from 04/19/2022: Stage IVB (cT2, cN0, pM1) - Signed by [name], MD on 05/08/2022 Total positive nodes: 0   05/05/2022 Genetic Testing   Negative genetic testing on the Multi-cancer gene panel + RNA.  FH c.259C>T VUS identified.  The report date is May 05, 2022.  The Multi-Cancer + RNA Panel offered by [facility] includes sequencing and/or deletion/duplication analysis of the following 70 genes:  AIP*, ALK, APC*, ATM*, AXIN2*, BAP1*, BARD1*, BLM*, BMPR1A*, BRCA1*, BRCA2*, BRIP1*, CDC73*, CDH1*, CDK4, CDKN1B*, CDKN2A, CHEK2*, CTNNA1*, DICER1*, EPCAM (del/dup only), EGFR, FH*, FLCN*, GREM1 (promoter dup only), HOXB13, KIT, LZTR1, MAX*, MBD4, MEN1*, MET, MITF, MLH1*, MSH2*, MSH3*, MSH6*, MUTYH*, NF1*, NF2*, NTHL1*, PALB2*, PDGFRA, PMS2*, POLD1*, POLE*, POT1*, PRKAR1A*, PTCH1*, PTEN*, RAD51C*, RAD51D*, RB1*, RET, SDHA* (sequencing only), SDHAF2*, SDHB*, SDHC*, SDHD*, SMAD4*, SMARCA4*, SMARCB1*, SMARCE1*, STK11*, SUFU*, TMEM127*, TP53*, TSC1*, TSC2*, VHL*. RNA analysis is performed for * genes.    05/09/2022 - 06/07/2022 Chemotherapy   Patient is on Treatment Plan : GASTROESOPHAGEAL FLOT q14d X 4 cycles  Miscellaneous   Foundation  One  Biomarker Findings Microsatellite status- Cannot be determined Tumor Mutational Burden- Cannot be determined  Genomic Findings  FGFR2 amplification,FGFR2-TACC2 fusion,  Rearrangement intron 17 ARAF amplification CCND3 amplification TP53 V220fs*74     05/30/2022 Imaging    IMPRESSION: 1. Mild hypermetabolism corresponding to a dominant left upper quadrant mass and smaller perigastric nodules or nodes. 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 Recommend attention to the dominant left upper quadrant soft tissue mass on follow-up exams to exclude unlikely recurrent lymphoma. 2. No gastric hypermetabolism and no typical findings of metastatic disease.   06/20/2022 - 11/16/2022 Chemotherapy   Patient is on Treatment Plan : GASTRIC FOLFOX q14d x 12 cycles     08/27/2022 Imaging    IMPRESSION: No focal gastric mass on CT.   No findings suspicious for recurrent or metastatic disease.   Stable left upper abdominal soft tissue lesion and small lymph nodes, chronic, favoring treated lymphoma.   11/27/2022 Imaging    IMPRESSION: 1. Questionable thickening of the distal esophagus/GE junction and gastric antrum, consider further evaluation with endoscopy. 2. Chronically stable left upper quadrant nodularity and prominent lymph nodes again favored treated lymphoma. Continued attention on follow-up imaging suggested. 3. No convincing evidence of metastatic disease in the chest, abdomen or pelvis. 4. Questionable asymmetric wall thickening of the rectum, consider further evaluation with colonoscopy. 5. Mild wall thickening of a nondistended urinary bladder, correlate with urinalysis to exclude cystitis. 6. Hepatic steatosis.   07/10/2023 - 11/27/2023 Chemotherapy   Patient is on Treatment Plan : GASTROESOPHAGEAL Zolbetuximab (800/400) + FOLFOX D1,15,29 q42d x 4 cycles / Zolbetuximab (400) + 5FU + Leucovorin  D1,15,29 q42d     12/17/2023 -   Chemotherapy   Patient is on Treatment Plan : GASTRIC/GE JUNCTION PACLitaxel  D1,8,15 + Ramucirumab  D1,15 q28d        Discussed the use of AI scribe software for clinical note transcription with the patient, who gave verbal consent to proceed.  History of Present Illness Sheryl [name] is a 58 year old female with gastric cancer who presents for follow-up. She is accompanied by her son, Demonte.  Vernell is undergoing treatment for gastric cancer with peritoneal metastasis. She previously received oxaliplatin  and 5FU, which caused neuropathy and cold sensitivity. She is considering a second-line treatment with paclitaxel  and romosozumab, which may also cause neuropathy.  A recent PET scan on August 8th indicates stable to mild progression of the primary gastric tumor and progression of peritoneal metastasis. Right hydronephrosis is present, with associated occasional discomfort during urination.  She has previously taken Xeloda , but her disease progressed on this medication, necessitating a change in treatment. Her kidney function remains normal, despite a large cyst in the left kidney.  Recent laboratory results show normal kidney and liver function, slightly low potassium levels, and a slightly elevated platelet count.     All other systems were reviewed with the patient and are negative.  MEDICAL HISTORY:  Past Medical History:  Diagnosis Date   Blood transfusion without reported diagnosis    had transfusion with hysterectomy   Cataract    Colon polyps 2012   Diabetes (HCC) 03/13/2021   Diabetes (HCC) 05/21/2019   Family history of breast cancer    Family history of pancreatic cancer    Family history of stomach cancer    Fibroid    gastric ca 03/2022   GERD (gastroesophageal reflux disease)    H/O blood clots  History of hysterectomy    fibroids and heavy cycles   Hypertension     SURGICAL HISTORY: Past Surgical History:  Procedure Laterality Date   ABDOMINAL  HYSTERECTOMY     BIOPSY  04/19/2022   Procedure: BIOPSY;  Surgeon: Wilhelmenia Aloha Raddle., MD;  Location: [facility];  Service: Gastroenterology;;   BIOPSY  03/21/2023   Procedure: BIOPSY;  Surgeon: Wilhelmenia Aloha Raddle., MD;  Location: [facility];  Service: Gastroenterology;;   COLONOSCOPY     ESOPHAGOGASTRODUODENOSCOPY (EGD) WITH PROPOFOL  N/A 04/19/2022   Procedure: ESOPHAGOGASTRODUODENOSCOPY (EGD) WITH PROPOFOL ;  Surgeon: Wilhelmenia Aloha Raddle., MD;  Location: [facility];  Service: Gastroenterology;  Laterality: N/A;   ESOPHAGOGASTRODUODENOSCOPY (EGD) WITH PROPOFOL  N/A 03/21/2023   Procedure: ESOPHAGOGASTRODUODENOSCOPY (EGD) WITH PROPOFOL ;  Surgeon: Wilhelmenia Aloha Raddle., MD;  Location: [facility];  Service: Gastroenterology;  Laterality: N/A;   EUS N/A 04/19/2022   Procedure: UPPER ENDOSCOPIC ULTRASOUND (EUS) RADIAL;  Surgeon: Wilhelmenia Aloha Raddle., MD;  Location: [facility];  Service: Gastroenterology;  Laterality: N/A;   EXCISION OF SKIN TAG  05/03/2022   Procedure: EXCISION OF CHEST WALL SKIN LESION;  Surgeon: Dasie Leonor CROME, MD;  Location: [facility];  Service: General;;   LAPAROSCOPY N/A 05/03/2022   Procedure: LAPAROSCOPY DIAGNOSTIC WITH PERITONEAL WASHINGS;  Surgeon: Dasie Leonor CROME, MD;  Location: [facility];  Service: General;  Laterality: N/A;   POLYPECTOMY  04/19/2022   Procedure: POLYPECTOMY;  Surgeon: Wilhelmenia Aloha Raddle., MD;  Location: [facility];  Service: Gastroenterology;;   PORTACATH PLACEMENT N/A 05/03/2022   Procedure: INSERTION PORT-A-CATH WITH ULTRASOUND GUIDANCE;  Surgeon: Dasie Leonor CROME, MD;  Location: [facility];  Service: General;  Laterality: N/A;   UPPER GASTROINTESTINAL ENDOSCOPY      I have reviewed the social history and family history with the patient and they are unchanged from previous note.  ALLERGIES:  is allergic to aspirin, cyclobenzaprine, naproxen sodium, zithromax [azithromycin dihydrate], oxaliplatin , and dilaudid  [hydromorphone].  MEDICATIONS:  Current Outpatient Medications  Medication Sig Dispense Refill   acetaminophen  (TYLENOL ) 500 MG tablet Take 2 tablets (1,000 mg total) by mouth every 8 (eight) hours as needed (pain). 30 tablet 1   amLODipine  (NORVASC ) 10 MG tablet Take 1 tablet (10 mg total) by mouth daily. 30 tablet 2   b complex vitamins capsule Take 1 capsule by mouth daily.     calcium -vitamin D (OSCAL WITH D) 500-5 MG-MCG tablet Take 2 tablets by mouth 2 (two) times daily.     famotidine  (PEPCID ) 20 MG tablet Take 1 tablet (20 mg total) by mouth 2 (two) times daily. 60 tablet 2   HYDROcodone -acetaminophen  (NORCO/VICODIN) 5-325 MG tablet Take 1 tablet by mouth every 6 (six) hours as needed for moderate pain (pain score 4-6). 20 tablet 0   lidocaine -prilocaine  (EMLA ) cream Apply 1 Application topically as needed. 30 g 1   metoCLOPramide  (REGLAN ) 10 MG tablet Take 1 tablet (10 mg total) by mouth every 8 (eight) hours as needed for nausea. 60 tablet 1   pantoprazole  (PROTONIX ) 40 MG tablet Take 1 tablet (40 mg total) by mouth daily. 30 tablet 2   potassium chloride  SA (KLOR-CON  M) 20 MEQ tablet Take 2 tablets (40 mEq total) by mouth 2 (two) times daily for 5 days, THEN 1 tablet (20 mEq total) 2 (two) times daily for 25 days. 80 tablet 0   prochlorperazine  (COMPAZINE ) 10 MG tablet Take 1 tablet (10 mg total) by mouth every 6 (six) hours as needed for nausea or vomiting. 30 tablet 2  promethazine  (PHENERGAN ) 50 MG tablet Take 1 tablet (50 mg total) by mouth 2 (two) times daily as needed for nausea or vomiting. 45 tablet 1   scopolamine  (TRANSDERM-SCOP) 1 MG/3DAYS Place 1 patch (1.5 mg total) onto the skin every 3 (three) days. 10 patch 1   sucralfate  (CARAFATE ) 1 g tablet Take 1 tablet (1 g total) by mouth 2 (two) times daily. 60 tablet 6   zinc gluconate 50 MG tablet Take 50 mg by mouth daily.     No current facility-administered medications for this visit.   Facility-Administered Medications  Ordered in Other Visits  Medication Dose Route Frequency Provider Last Rate Last Admin   0.9 %  sodium chloride  infusion   Intravenous Continuous [name], MD 10 mL/hr at 12/17/23 1206 New Bag at 12/17/23 1206   PACLitaxel  (TAXOL ) 150 mg in sodium chloride  0.9 % 250 mL chemo infusion (</= 80mg /m2)  80 mg/m2 (Treatment Plan Recorded) Intravenous Once [name], MD       sodium chloride  flush (NS) 0.9 % injection 10 mL  10 mL Intracatheter PRN [name], MD        PHYSICAL EXAMINATION: ECOG PERFORMANCE STATUS: 2 - Symptomatic, <50% confined to bed  Vitals:   12/17/23 1058 12/17/23 1059  BP: (!) 128/104 (!) 140/107  Pulse: (!) 103   Resp: 17   Temp: 97.9 F (36.6 C)   SpO2: 99%    Wt Readings from Last 3 Encounters:  12/17/23 164 lb 4 oz (74.5 kg)  12/11/23 166 lb 1.6 oz (75.3 kg)  11/27/23 170 lb 8 oz (77.3 kg)     GENERAL:alert, no distress and comfortable SKIN: skin color, texture, turgor are normal, no rashes or significant lesions EYES: normal, Conjunctiva are pink and non-injected, sclera clear NECK: supple, thyroid  normal size, non-tender, without nodularity LYMPH:  no palpable lymphadenopathy in the cervical, axillary  LUNGS: clear to auscultation and percussion with normal breathing effort HEART: regular rate & rhythm and no murmurs and no lower extremity edema ABDOMEN:abdomen soft, non-tender and normal bowel sounds Musculoskeletal:no cyanosis of digits and no clubbing  NEURO: alert & oriented x 3 with fluent speech, no focal motor/sensory deficits  Physical Exam    LABORATORY DATA:  I have reviewed the data as listed    Latest Ref Rng & Units 12/17/2023   10:13 AM 12/11/2023    9:11 AM 11/27/2023    9:07 AM  CBC  WBC 4.0 - 10.5 K/uL 3.2  2.9  2.9   Hemoglobin 12.0 - 15.0 g/dL 85.9  86.4  86.6   Hematocrit 36.0 - 46.0 % 39.7  37.9  38.3   Platelets 150 - 400 K/uL 404  300  291         Latest Ref Rng & Units 12/17/2023   10:13 AM 12/11/2023    9:11 AM  11/27/2023    9:07 AM  CMP  Glucose 70 - 99 mg/dL 862  843  831   BUN 6 - 20 mg/dL 6  7  7    Creatinine 0.44 - 1.00 mg/dL 9.29  9.40  9.26   Sodium 135 - 145 mmol/L 138  136  137   Potassium 3.5 - 5.1 mmol/L 3.4  3.7  3.3   Chloride 98 - 111 mmol/L 100  101  101   CO2 22 - 32 mmol/L 31  29  30    Calcium  8.9 - 10.3 mg/dL 9.0  8.7  8.9   Total Protein 6.5 - 8.1 g/dL 6.3  6.1  6.3   Total Bilirubin 0.0 - 1.2 mg/dL 0.4  0.4  0.5   Alkaline Phos 38 - 126 U/L 85  83  84   AST 15 - 41 U/L 17  16  19    ALT 0 - 44 U/L 9  11  12        RADIOGRAPHIC STUDIES: I have personally reviewed the radiological images as listed and agreed with the findings in the report. No results found.    No orders of the defined types were placed in this encounter.  All questions were answered. The patient knows to call the clinic with any problems, questions or concerns. No barriers to learning was detected. The total time spent in the appointment was 40 minutes, including review of chart and various tests results, discussions about plan of care and coordination of care plan     Onita Mattock, MD 12/17/2023

## 2023-12-17 NOTE — Assessment & Plan Note (Addendum)
 [id number] with peritoneal metastasis. 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   -she started first line chemo FLOT on 05/09/22 -She understands that chemotherapy is palliative, to prolong her life.  We are unlikely going to cure her cancer. -PD-L1 0-1%, very limited benefit from PD-L1 immunotherapy, FO revealed FGFR2 amplification and fusion (+), FGFR inhibitors can be considered in future, no other targeted therapy available  -I changed her chemo from FLOT to FOLFOX on 06/20/2022 -She is not able to return to work due to the cancer and treatment related symptoms.  -I subsequently changed her treatment to maintenance Xeloda  in early August 2024, she is tolerating well overall  -her NGS Caris showed positive Claudin 18.2, she is a candidate for zolbetuximab.  -Repeated EGD on March 21, 2023 showed residual gastric cancer.  We discussed option of changing her chemotherapy back to FOLFOX and add zolbetuximab.  -PET 06/03/2023 showed stable disease (no hypermetabolic disease outside stomach) -Patient developed recurrent abdominal pain, similar to the symptoms she had when she was diagnosed.  She agreed to change treatment back to FOLFOX, and add Zolbetuximab. She started on 07/09/2023. She tolerated first cycle poorly and had prolonged recovery. Oxaliplatin  was stop after cycle 1 due to poor tolerance. She continued 5-fu/LV and zolbe every 2 weeks  -CT 10/10/2023 showed stable disease - Unfortunately PET scan on December 06, 2023 showed worsening peritoneal metastasis.  I recommend change treatment to paclitaxel  and ramucirumab , will start on 12/17/23

## 2023-12-17 NOTE — Patient Instructions (Addendum)
 [facility] - A DEPT OF [facility]  Discharge Instructions: Thank you for choosing [facility] to provide your oncology and hematology care.   If you have a lab appointment with the Cancer Center, please go directly to the Cancer Center and check in at the registration area.   Wear comfortable clothing and clothing appropriate for easy access to any Portacath or PICC line.   We strive to give you quality time with your provider. You may need to reschedule your appointment if you arrive late (15 or more minutes).  Arriving late affects you and other patients whose appointments are after yours.  Also, if you miss three or more appointments without notifying the office, you may be dismissed from the clinic at the provider's discretion.      For prescription refill requests, have your pharmacy contact our office and allow 72 hours for refills to be completed.    Today you received the following chemotherapy and/or immunotherapy agents:Ramucirumab  Injection What is this medication? RAMUCIRUMAB  (ra mue SIR ue mab) treats some types of cancer. It works by blocking a protein that causes cancer cells to grow and multiply. This helps to slow or stop the spread of cancer cells. It is a monoclonal antibody. This medicine may be used for other purposes; ask your health care provider or pharmacist if you have questions. COMMON BRAND NAME(S): Cyramza  What should I tell my care team before I take this medication? They need to know if you have any of these conditions: Blood clots Having or recent surgery Heart attack High blood pressure History of a tear in your stomach or intestines Liver disease Protein in your urine Stomach bleeding Stroke Thyroid  disease An unusual or allergic reaction to ramucirumab , other medications, foods, dyes, or preservatives Pregnant or trying to get pregnant Breast-feeding How should I use this medication? This medication is  injected into a vein. It is given by your care team in a hospital or clinic setting. Talk to your care team about the use of this medication in children. Special care may be needed. Overdosage: If you think you have taken too much of this medicine contact a poison control center or emergency room at once. NOTE: This medicine is only for you. Do not share this medicine with others. What if I miss a dose? Keep appointments for follow-up doses. It is important not to miss your dose. Call your care team if you are unable to keep an appointment. What may interact with this medication? Interactions have not been studied. This list may not describe all possible interactions. Give your health care provider a list of all the medicines, herbs, non-prescription drugs, or dietary supplements you use. Also tell them if you smoke, drink alcohol, or use illegal drugs. Some items may interact with your medicine. What should I watch for while using this medication? Your condition will be monitored carefully while you are receiving this medication. You may need blood work while taking this medication. This medication may make you feel generally unwell. This is not uncommon as chemotherapy can affect health cells as well as cancer cells. Report any side effects. Continue your course of treatment even though you feel ill unless your care team tells you to stop. This medication may increase your risk to bruise or bleed. Call your care team if you notice any unusual bleeding. Before having surgery, talk to your care team to make sure it is ok. This medication can  increase the risk of poor healing of your surgical site or wound. You will need to stop this medication for 28 days before surgery. After surgery, wait at least 2 weeks before restarting this medication. Make sure the surgical site or wound is healed enough before restarting this medication. Talk to your care team if questions. Talk to your care team if you may be  pregnant. Serious birth defects can occur if you take this medication during pregnancy and for 3 months after the last dose. You will need a negative pregnancy test before starting this medication. Contraception is recommended while taking this medication and for 3 months after the last dose. Your care team can help you find the option that works for you. Do not breastfeed while taking this medication and for 2 months after the last dose. This medication may cause infertility. Talk to your care team if you are concerned about your fertility. What side effects may I notice from receiving this medication? Side effects that you should report to your care team as soon as possible: Allergic reactions--skin rash, itching, hives, swelling of the face, lips, tongue, or throat Bleeding--bloody or black, tar-like stools, vomiting blood or brown material that looks like coffee grounds, red or dark brown urine, small red or purple spots on skin, unusual bruising or bleeding Dizziness, loss of balance or coordination, confusion or trouble speaking Heart attack--pain or tightness in the chest, shoulders, arms, or jaw, nausea, shortness of breath, cold or clammy skin, feeling faint or lightheaded Increase in blood pressure Infection--fever, chills, cough, sore throat, wounds that don't heal, pain or trouble when passing urine, general feeling of discomfort or being unwell Infusion reactions--chest pain, shortness of breath or trouble breathing, feeling faint or lightheaded Kidney injury--decrease in the amount of urine, swelling of the ankles, hands, or feet Liver injury--right upper belly pain, loss of appetite, nausea, light-colored stool, dark yellow or brown urine, yellowing skin or eyes, unusual weakness or fatigue Low thyroid  levels (hypothyroidism)--unusual weakness or fatigue, increased sensitivity to cold, constipation, hair loss, dry skin, weight gain, feelings of depression Stomach pain that is severe,  does not go away, or gets worse Stroke--sudden numbness or weakness of the face, arm, or leg, trouble speaking, confusion, trouble walking, loss of balance or coordination, dizziness, severe headache, change in vision Sudden and severe headache, confusion, change in vision, seizures, which may be signs of posterior reversible encephalopathy syndrome (PRES) Side effects that usually do not require medical attention (report to your care team if they continue or are bothersome): Diarrhea Fatigue Stomach pain Swelling of the ankles, hands, or feet This list may not describe all possible side effects. Call your doctor for medical advice about side effects. You may report side effects to FDA at 1-800-FDA-1088. Where should I keep my medication? This medication is given in a hospital or clinic. It will not be stored at home. NOTE: This sheet is a summary. It may not cover all possible information. If you have questions about this medicine, talk to your doctor, pharmacist, or health care provider.  2024 Elsevier/Gold Standard (2021-09-07 00:00:00)(TAXOL )  and ramucirumab  (CYRAMZA )      To help prevent nausea and vomiting after your treatment, we encourage you to take your nausea medication as directed.  BELOW ARE SYMPTOMS THAT SHOULD BE REPORTED IMMEDIATELY: *FEVER GREATER THAN 100.4 F (38 C) OR HIGHER *CHILLS OR SWEATING *NAUSEA AND VOMITING THAT IS NOT CONTROLLED WITH YOUR NAUSEA MEDICATION *UNUSUAL SHORTNESS OF BREATH *UNUSUAL BRUISING OR BLEEDING *URINARY PROBLEMS (  pain or burning when urinating, or frequent urination) *BOWEL PROBLEMS (unusual diarrhea, constipation, pain near the anus) TENDERNESS IN MOUTH AND THROAT WITH OR WITHOUT PRESENCE OF ULCERS (sore throat, sores in mouth, or a toothache) UNUSUAL RASH, SWELLING OR PAIN  UNUSUAL VAGINAL DISCHARGE OR ITCHING   Items with * indicate a potential emergency and should be followed up as soon as possible or go to the Emergency Department if  any problems should occur.  Please show the CHEMOTHERAPY ALERT CARD or IMMUNOTHERAPY ALERT CARD at check-in to the Emergency Department and triage nurse.  Should you have questions after your visit or need to cancel or reschedule your appointment, please contact [facility]  Dept: [phone]  and follow the prompts.  Office hours are 8:00 a.m. to 4:30 p.m. Monday - Friday. Please note that voicemails left after 4:00 p.m. may not be returned until the following business day.  We are closed weekends and major holidays. You have access to a nurse at all times for urgent questions. Please call the main number to the clinic Dept: [phone] and follow the prompts.   For any non-urgent questions, you may also contact your provider using MyChart. We now offer e-Visits for anyone 53 and older to request care online for non-urgent symptoms. For details visit [email or web address].   Also download the MyChart app! Go to the app store, search MyChart, open the app, select [facility], and log in with your MyChart username and password. Ramucirumab  Injection What is this medication? RAMUCIRUMAB  (ra mue SIR ue mab) treats some types of cancer. It works by blocking a protein that causes cancer cells to grow and multiply. This helps to slow or stop the spread of cancer cells. It is a monoclonal antibody. This medicine may be used for other purposes; ask your health care provider or pharmacist if you have questions. COMMON BRAND NAME(S): Cyramza  What should I tell my care team before I take this medication? They need to know if you have any of these conditions: Blood clots Having or recent surgery Heart attack High blood pressure History of a tear in your stomach or intestines Liver disease Protein in your urine Stomach bleeding Stroke Thyroid  disease An unusual or allergic reaction to ramucirumab , other medications, foods, dyes, or  preservatives Pregnant or trying to get pregnant Breast-feeding How should I use this medication? This medication is injected into a vein. It is given by your care team in a hospital or clinic setting. Talk to your care team about the use of this medication in children. Special care may be needed. Overdosage: If you think you have taken too much of this medicine contact a poison control center or emergency room at once. NOTE: This medicine is only for you. Do not share this medicine with others. What if I miss a dose? Keep appointments for follow-up doses. It is important not to miss your dose. Call your care team if you are unable to keep an appointment. What may interact with this medication? Interactions have not been studied. This list may not describe all possible interactions. Give your health care provider a list of all the medicines, herbs, non-prescription drugs, or dietary supplements you use. Also tell them if you smoke, drink alcohol, or use illegal drugs. Some items may interact with your medicine. What should I watch for while using this medication? Your condition will be monitored carefully while you are receiving this medication. You may  need blood work while taking this medication. This medication may make you feel generally unwell. This is not uncommon as chemotherapy can affect health cells as well as cancer cells. Report any side effects. Continue your course of treatment even though you feel ill unless your care team tells you to stop. This medication may increase your risk to bruise or bleed. Call your care team if you notice any unusual bleeding. Before having surgery, talk to your care team to make sure it is ok. This medication can increase the risk of poor healing of your surgical site or wound. You will need to stop this medication for 28 days before surgery. After surgery, wait at least 2 weeks before restarting this medication. Make sure the surgical site or wound is  healed enough before restarting this medication. Talk to your care team if questions. Talk to your care team if you may be pregnant. Serious birth defects can occur if you take this medication during pregnancy and for 3 months after the last dose. You will need a negative pregnancy test before starting this medication. Contraception is recommended while taking this medication and for 3 months after the last dose. Your care team can help you find the option that works for you. Do not breastfeed while taking this medication and for 2 months after the last dose. This medication may cause infertility. Talk to your care team if you are concerned about your fertility. What side effects may I notice from receiving this medication? Side effects that you should report to your care team as soon as possible: Allergic reactions--skin rash, itching, hives, swelling of the face, lips, tongue, or throat Bleeding--bloody or black, tar-like stools, vomiting blood or brown material that looks like coffee grounds, red or dark brown urine, small red or purple spots on skin, unusual bruising or bleeding Dizziness, loss of balance or coordination, confusion or trouble speaking Heart attack--pain or tightness in the chest, shoulders, arms, or jaw, nausea, shortness of breath, cold or clammy skin, feeling faint or lightheaded Increase in blood pressure Infection--fever, chills, cough, sore throat, wounds that don't heal, pain or trouble when passing urine, general feeling of discomfort or being unwell Infusion reactions--chest pain, shortness of breath or trouble breathing, feeling faint or lightheaded Kidney injury--decrease in the amount of urine, swelling of the ankles, hands, or feet Liver injury--right upper belly pain, loss of appetite, nausea, light-colored stool, dark yellow or brown urine, yellowing skin or eyes, unusual weakness or fatigue Low thyroid  levels (hypothyroidism)--unusual weakness or fatigue, increased  sensitivity to cold, constipation, hair loss, dry skin, weight gain, feelings of depression Stomach pain that is severe, does not go away, or gets worse Stroke--sudden numbness or weakness of the face, arm, or leg, trouble speaking, confusion, trouble walking, loss of balance or coordination, dizziness, severe headache, change in vision Sudden and severe headache, confusion, change in vision, seizures, which may be signs of posterior reversible encephalopathy syndrome (PRES) Side effects that usually do not require medical attention (report to your care team if they continue or are bothersome): Diarrhea Fatigue Stomach pain Swelling of the ankles, hands, or feet This list may not describe all possible side effects. Call your doctor for medical advice about side effects. You may report side effects to FDA at 1-800-FDA-1088. Where should I keep my medication? This medication is given in a hospital or clinic. It will not be stored at home. NOTE: This sheet is a summary. It may not cover all possible information. If you have  questions about this medicine, talk to your doctor, pharmacist, or health care provider.  2024 Elsevier/Gold Standard (2021-09-07 00:00:00) Paclitaxel  Injection What is this medication? PACLITAXEL  (PAK li TAX el) treats some types of cancer. It works by slowing down the growth of cancer cells. This medicine may be used for other purposes; ask your health care provider or pharmacist if you have questions. COMMON BRAND NAME(S): Onxol, Taxol  What should I tell my care team before I take this medication? They need to know if you have any of these conditions: Heart disease Liver disease Low white blood cell levels An unusual or allergic reaction to paclitaxel , other medications, foods, dyes, or preservatives If you or your partner are pregnant or trying to get pregnant Breast-feeding How should I use this medication? This medication is injected into a vein. It is given by  your care team in a hospital or clinic setting. Talk to your care team about the use of this medication in children. While it may be given to children for selected conditions, precautions do apply. Overdosage: If you think you have taken too much of this medicine contact a poison control center or emergency room at once. NOTE: This medicine is only for you. Do not share this medicine with others. What if I miss a dose? Keep appointments for follow-up doses. It is important not to miss your dose. Call your care team if you are unable to keep an appointment. What may interact with this medication? Do not take this medication with any of the following: Live virus vaccines Other medications may affect the way this medication works. Talk with your care team about all of the medications you take. They may suggest changes to your treatment plan to lower the risk of side effects and to make sure your medications work as intended. This list may not describe all possible interactions. Give your health care provider a list of all the medicines, herbs, non-prescription drugs, or dietary supplements you use. Also tell them if you smoke, drink alcohol, or use illegal drugs. Some items may interact with your medicine. What should I watch for while using this medication? Your condition will be monitored carefully while you are receiving this medication. You may need blood work while taking this medication. This medication may make you feel generally unwell. This is not uncommon as chemotherapy can affect healthy cells as well as cancer cells. Report any side effects. Continue your course of treatment even though you feel ill unless your care team tells you to stop. This medication can cause serious allergic reactions. To reduce the risk, your care team may give you other medications to take before receiving this one. Be sure to follow the directions from your care team. This medication may increase your risk of  getting an infection. Call your care team for advice if you get a fever, chills, sore throat, or other symptoms of a cold or flu. Do not treat yourself. Try to avoid being around people who are sick. This medication may increase your risk to bruise or bleed. Call your care team if you notice any unusual bleeding. Be careful brushing or flossing your teeth or using a toothpick because you may get an infection or bleed more easily. If you have any dental work done, tell your dentist you are receiving this medication. Talk to your care team if you may be pregnant. Serious birth defects can occur if you take this medication during pregnancy. Talk to your care team before breastfeeding. Changes to  your treatment plan may be needed. What side effects may I notice from receiving this medication? Side effects that you should report to your care team as soon as possible: Allergic reactions--skin rash, itching, hives, swelling of the face, lips, tongue, or throat Heart rhythm changes--fast or irregular heartbeat, dizziness, feeling faint or lightheaded, chest pain, trouble breathing Increase in blood pressure Infection--fever, chills, cough, sore throat, wounds that don't heal, pain or trouble when passing urine, general feeling of discomfort or being unwell Low blood pressure--dizziness, feeling faint or lightheaded, blurry vision Low red blood cell level--unusual weakness or fatigue, dizziness, headache, trouble breathing Painful swelling, warmth, or redness of the skin, blisters or sores at the infusion site Pain, tingling, or numbness in the hands or feet Slow heartbeat--dizziness, feeling faint or lightheaded, confusion, trouble breathing, unusual weakness or fatigue Unusual bruising or bleeding Side effects that usually do not require medical attention (report to your care team if they continue or are bothersome): Diarrhea Hair loss Joint pain Loss of appetite Muscle pain Nausea Vomiting This  list may not describe all possible side effects. Call your doctor for medical advice about side effects. You may report side effects to FDA at 1-800-FDA-1088. Where should I keep my medication? This medication is given in a hospital or clinic. It will not be stored at home. NOTE: This sheet is a summary. It may not cover all possible information. If you have questions about this medicine, talk to your doctor, pharmacist, or health care provider.  2024 Elsevier/Gold Standard (2021-09-05 00:00:00)

## 2023-12-18 ENCOUNTER — Telehealth: Payer: Self-pay

## 2023-12-18 LAB — T4: T4, Total: 9.6 ug/dL (ref 4.5–12.0)

## 2023-12-18 NOTE — Telephone Encounter (Signed)
-----   Message from Nurse Milo KIDD sent at 12/17/2023  5:13 PM EDT ----- Regarding: 1st time Paclitaxel  and Ramucirumab  Dr Demetra patient, first time Paclitaxel  and Ramucirumab . Patient tolerated it well

## 2023-12-18 NOTE — Telephone Encounter (Signed)
 Sheryl [name] states that she is doing fine. She is eating, drinking, and urinating well. She knows to call the office at [phone] if  she has any questions or concerns.

## 2023-12-20 ENCOUNTER — Other Ambulatory Visit: Payer: Self-pay

## 2023-12-24 NOTE — Assessment & Plan Note (Signed)
 [id number] with peritoneal metastasis. 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   -she started first line chemo FLOT on 05/09/22 -She understands that chemotherapy is palliative, to prolong her life.  We are unlikely going to cure her cancer. -PD-L1 0-1%, very limited benefit from PD-L1 immunotherapy, FO revealed FGFR2 amplification and fusion (+), FGFR inhibitors can be considered in future, no other targeted therapy available  -I changed her chemo from FLOT to FOLFOX on 06/20/2022 -She is not able to return to work due to the cancer and treatment related symptoms.  -I subsequently changed her treatment to maintenance Xeloda  in early August 2024, she is tolerating well overall  -her NGS Caris showed positive Claudin 18.2, she is a candidate for zolbetuximab.  -Repeated EGD on March 21, 2023 showed residual gastric cancer.  We discussed option of changing her chemotherapy back to FOLFOX and add zolbetuximab.  -PET 06/03/2023 showed stable disease (no hypermetabolic disease outside stomach) -Patient developed recurrent abdominal pain, similar to the symptoms she had when she was diagnosed.  She agreed to change treatment back to FOLFOX, and add Zolbetuximab. She started on 07/09/2023. She tolerated first cycle poorly and had prolonged recovery. Oxaliplatin  was stop after cycle 1 due to poor tolerance. She continued 5-fu/LV and zolbe every 2 weeks  -CT 10/10/2023 showed stable disease - Unfortunately PET scan on December 06, 2023 showed worsening peritoneal metastasis.  I recommend change treatment to paclitaxel  and ramucirumab , will start on 12/17/23

## 2023-12-25 ENCOUNTER — Other Ambulatory Visit: Payer: Self-pay

## 2023-12-25 ENCOUNTER — Inpatient Hospital Stay (HOSPITAL_BASED_OUTPATIENT_CLINIC_OR_DEPARTMENT_OTHER)

## 2023-12-25 ENCOUNTER — Ambulatory Visit

## 2023-12-25 ENCOUNTER — Inpatient Hospital Stay

## 2023-12-25 ENCOUNTER — Other Ambulatory Visit (HOSPITAL_COMMUNITY): Payer: Self-pay

## 2023-12-25 ENCOUNTER — Ambulatory Visit: Admitting: Hematology

## 2023-12-25 ENCOUNTER — Inpatient Hospital Stay (HOSPITAL_BASED_OUTPATIENT_CLINIC_OR_DEPARTMENT_OTHER): Admitting: Hematology

## 2023-12-25 ENCOUNTER — Other Ambulatory Visit

## 2023-12-25 VITALS — BP 124/86 | HR 96 | Temp 97.7°F | Resp 15 | Ht 68.0 in | Wt 165.6 lb

## 2023-12-25 VITALS — BP 137/93 | HR 84 | Temp 98.4°F | Resp 16

## 2023-12-25 DIAGNOSIS — C162 Malignant neoplasm of body of stomach: Secondary | ICD-10-CM

## 2023-12-25 DIAGNOSIS — Z95828 Presence of other vascular implants and grafts: Secondary | ICD-10-CM

## 2023-12-25 DIAGNOSIS — Z5112 Encounter for antineoplastic immunotherapy: Secondary | ICD-10-CM | POA: Diagnosis not present

## 2023-12-25 LAB — CBC WITH DIFFERENTIAL (CANCER CENTER ONLY)
Abs Immature Granulocytes: 0.01 K/uL (ref 0.00–0.07)
Basophils Absolute: 0 K/uL (ref 0.0–0.1)
Basophils Relative: 1 %
Eosinophils Absolute: 0.2 K/uL (ref 0.0–0.5)
Eosinophils Relative: 6 %
HCT: 37 % (ref 36.0–46.0)
Hemoglobin: 13.1 g/dL (ref 12.0–15.0)
Immature Granulocytes: 0 %
Lymphocytes Relative: 28 %
Lymphs Abs: 0.8 K/uL (ref 0.7–4.0)
MCH: 29.4 pg (ref 26.0–34.0)
MCHC: 35.4 g/dL (ref 30.0–36.0)
MCV: 83 fL (ref 80.0–100.0)
Monocytes Absolute: 0.3 K/uL (ref 0.1–1.0)
Monocytes Relative: 11 %
Neutro Abs: 1.6 K/uL — ABNORMAL LOW (ref 1.7–7.7)
Neutrophils Relative %: 54 %
Platelet Count: 362 K/uL (ref 150–400)
RBC: 4.46 MIL/uL (ref 3.87–5.11)
RDW: 13.9 % (ref 11.5–15.5)
WBC Count: 2.9 K/uL — ABNORMAL LOW (ref 4.0–10.5)
nRBC: 0 % (ref 0.0–0.2)

## 2023-12-25 LAB — CMP (CANCER CENTER ONLY)
ALT: 12 U/L (ref 0–44)
AST: 24 U/L (ref 15–41)
Albumin: 3.5 g/dL (ref 3.5–5.0)
Alkaline Phosphatase: 77 U/L (ref 38–126)
Anion gap: 5 (ref 5–15)
BUN: 6 mg/dL (ref 6–20)
CO2: 28 mmol/L (ref 22–32)
Calcium: 8.9 mg/dL (ref 8.9–10.3)
Chloride: 104 mmol/L (ref 98–111)
Creatinine: 0.48 mg/dL (ref 0.44–1.00)
GFR, Estimated: >60 mL/min (ref >60)
Glucose, Bld: 122 mg/dL — ABNORMAL HIGH (ref 70–99)
Potassium: 3.8 mmol/L (ref 3.5–5.1)
Sodium: 137 mmol/L (ref 135–145)
Total Bilirubin: 0.4 mg/dL (ref 0.0–1.2)
Total Protein: 6.1 g/dL — ABNORMAL LOW (ref 6.5–8.1)

## 2023-12-25 MED ORDER — SODIUM CHLORIDE 0.9% FLUSH
10.0000 mL | Freq: Once | INTRAVENOUS | Status: AC
  Administered 2023-12-25: 10 mL

## 2023-12-25 MED ORDER — DEXAMETHASONE SODIUM PHOSPHATE 10 MG/ML IJ SOLN
10.0000 mg | Freq: Once | INTRAMUSCULAR | Status: AC
  Administered 2023-12-25: 10 mg via INTRAVENOUS
  Filled 2023-12-25: qty 1

## 2023-12-25 MED ORDER — DIPHENHYDRAMINE HCL 50 MG/ML IJ SOLN
25.0000 mg | Freq: Once | INTRAMUSCULAR | Status: AC
  Administered 2023-12-25: 25 mg via INTRAVENOUS
  Filled 2023-12-25: qty 1

## 2023-12-25 MED ORDER — SODIUM CHLORIDE 0.9 % IV SOLN: INTRAVENOUS | Status: DC

## 2023-12-25 MED ORDER — FAMOTIDINE IN NACL 20-0.9 MG/50ML-% IV SOLN
20.0000 mg | Freq: Once | INTRAVENOUS | Status: AC
  Administered 2023-12-25: 20 mg via INTRAVENOUS
  Filled 2023-12-25: qty 50

## 2023-12-25 MED ORDER — SODIUM CHLORIDE 0.9 % IV SOLN
80.0000 mg/m2 | Freq: Once | INTRAVENOUS | Status: AC
  Administered 2023-12-25: 150 mg via INTRAVENOUS
  Filled 2023-12-25: qty 25

## 2023-12-25 MED ORDER — LIDOCAINE-PRILOCAINE 2.5-2.5 % EX CREA
1.0000 | TOPICAL_CREAM | CUTANEOUS | 1 refills | Status: AC | PRN
  Filled 2023-12-25: qty 30, 30d supply, fill #0
  Filled 2024-05-21: qty 30, 30d supply, fill #1

## 2023-12-25 MED ORDER — SODIUM CHLORIDE 0.9 % IV SOLN: INTRAVENOUS | Status: AC

## 2023-12-25 NOTE — Patient Instructions (Signed)
 [facility] - A DEPT OF [facility]  Discharge Instructions: Thank you for choosing [facility] to provide your oncology and hematology care.   If you have a lab appointment with the Cancer Center, please go directly to the Cancer Center and check in at the registration area.   Wear comfortable clothing and clothing appropriate for easy access to any Portacath or PICC line.   We strive to give you quality time with your provider. You may need to reschedule your appointment if you arrive late (15 or more minutes).  Arriving late affects you and other patients whose appointments are after yours.  Also, if you miss three or more appointments without notifying the office, you may be dismissed from the clinic at the provider's discretion.      For prescription refill requests, have your pharmacy contact our office and allow 72 hours for refills to be completed.    Today you received the following chemotherapy and/or immunotherapy agents: Taxol       To help prevent nausea and vomiting after your treatment, we encourage you to take your nausea medication as directed.  BELOW ARE SYMPTOMS THAT SHOULD BE REPORTED IMMEDIATELY: *FEVER GREATER THAN 100.4 F (38 C) OR HIGHER *CHILLS OR SWEATING *NAUSEA AND VOMITING THAT IS NOT CONTROLLED WITH YOUR NAUSEA MEDICATION *UNUSUAL SHORTNESS OF BREATH *UNUSUAL BRUISING OR BLEEDING *URINARY PROBLEMS (pain or burning when urinating, or frequent urination) *BOWEL PROBLEMS (unusual diarrhea, constipation, pain near the anus) TENDERNESS IN MOUTH AND THROAT WITH OR WITHOUT PRESENCE OF ULCERS (sore throat, sores in mouth, or a toothache) UNUSUAL RASH, SWELLING OR PAIN  UNUSUAL VAGINAL DISCHARGE OR ITCHING   Items with * indicate a potential emergency and should be followed up as soon as possible or go to the Emergency Department if any problems should occur.  Please show the CHEMOTHERAPY ALERT CARD or IMMUNOTHERAPY  ALERT CARD at check-in to the Emergency Department and triage nurse.  Should you have questions after your visit or need to cancel or reschedule your appointment, please contact [facility] - A DEPT OF [facility]  Dept: [phone]  and follow the prompts.  Office hours are 8:00 a.m. to 4:30 p.m. Monday - Friday. Please note that voicemails left after 4:00 p.m. may not be returned until the following business day.  We are closed weekends and major holidays. You have access to a nurse at all times for urgent questions. Please call the main number to the clinic Dept: [phone] and follow the prompts.   For any non-urgent questions, you may also contact your provider using MyChart. We now offer e-Visits for anyone 21 and older to request care online for non-urgent symptoms. For details visit [email or web address].   Also download the MyChart app! Go to the app store, search "MyChart", open the app, select Cloud, and log in with your MyChart username and password.

## 2023-12-25 NOTE — Progress Notes (Signed)
 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   -she started first line chemo FLOT on 05/09/22 -She understands that chemotherapy is palliative, to prolong her life.  We are unlikely going to cure her cancer. -PD-L1 0-1%, very limited benefit from PD-L1 immunotherapy, FO revealed FGFR2 amplification and fusion (+), FGFR inhibitors can be considered in future, no other targeted therapy available  -I changed her chemo from FLOT to FOLFOX on 06/20/2022 -She is not able to return to work due to the cancer and treatment related symptoms.  -I subsequently changed her treatment to maintenance Xeloda  in early August 2024, she is tolerating well overall  -her NGS Caris showed positive Claudin 18.2, she is a candidate for zolbetuximab.  -Repeated EGD on March 21, 2023 showed residual gastric cancer.  We discussed option of changing her chemotherapy back to FOLFOX and add zolbetuximab.  -PET 06/03/2023 showed stable disease (no hypermetabolic disease outside stomach) -Patient developed recurrent abdominal pain, similar to the symptoms she had when she was diagnosed.  She agreed to change treatment back to FOLFOX, and add Zolbetuximab. She started on 07/09/2023. She tolerated first cycle poorly and had prolonged recovery. Oxaliplatin  was stop after cycle 1 due to poor tolerance. She continued 5-fu/LV and zolbe every 2 weeks   -CT 10/10/2023 showed stable disease - Unfortunately PET scan on December 06, 2023 showed worsening peritoneal metastasis.  I recommend change treatment to paclitaxel  and ramucirumab , will start on 12/17/23 Assessment & Plan Gastric cancer on active chemotherapy and biologic therapy Gastric cancer is under treatment with a regimen of weekly chemotherapy for three weeks, with an antibody administered on the first and third weeks, followed by a week off. The last infusion was on July 30th. The current regimen is expected to be more tolerable than previous ones, though neuropathy remains a concern. A PET scan will be repeated after three months of treatment to assess response, pending insurance approval. - Continue current chemotherapy and biologic therapy regimen - Schedule PET scan after three months of treatment - Ensure insurance approval for PET scan  Chemotherapy-induced nausea Nausea is present but manageable. The new medication started last week is expected to cause less nausea. Nausea may also be attributed to the gastric cancer itself.  Chemotherapy-induced peripheral neuropathy Peripheral neuropathy is a concern with the current treatment regimen. No new or unusual numbness or tingling reported. Neuropathy management includes using ice, exercising, and using a stress ball. If symptoms worsen, medication may be considered. - Advise use of ice, exercise, and stress ball for neuropathy management - Monitor for worsening symptoms and consider medication if needed  Cancer-related pain Stomach pain persists, described as hunger pain. Pain management includes the use of lidocaine  cream. Pain may improve with treatment response, but this will be assessed with the upcoming scan. - Prescribe lidocaine  cream for pain management - Monitor pain levels and adjust treatment as needed  Leukopenia secondary to chemotherapy White blood cell count is slightly  low but within acceptable range to continue  treatment. No fever or chills reported.  Plan -lab reviewed, will proceed C1D8 chemo today -she will return next week for chemo -f/u in 3 weeks    SUMMARY OF ONCOLOGIC HISTORY: Oncology History Overview Note   Cancer Staging  Gastric cancer [facility]) Staging form: Stomach, AJCC 8th Edition - Clinical stage from 04/19/2022: Stage IVB (cT2, cN0, pM1) - Signed by Sheryl Callander, MD on 05/08/2022 Total positive nodes: 0     Gastric cancer (HCC)  03/30/2022 Procedure   EGD:  Impression:  - Normal esophagus. - A few gastric polyps. Biopsied. - Gastritis. Biopsied. - Non-bleeding gastric ulcer with no stigmata of bleeding. Biopsied. - Normal examined duodenum. Biopsied.  Findings: Diffuse moderate inflammation characterized by congestion (edema), friability and granularity was found in the cardia, in the gastric fundus and in the gastric body. There were associated erosions in multiple places. Biopsies were taken from the antrum, body, and fundus with a cold forceps for histology. Estimated blood loss was minimal.  One non-bleeding cratered gastric ulcer with no stigmata of bleeding was found on the greater curvature of the stomach. The lesion was 6 mm in largest dimension. The mucosa around the ulcer was heaped and led to some deformity in the antrum. Biopsies were taken with a cold forceps for histology. Estimated blood loss was minimal.    03/30/2022 Pathology Results   [name]  Accession: [id number]  Diagnosis 1. Surgical [[name]], duodenal - BENIGN SMALL BOWEL MUCOSA WITH NO SIGNIFICANT PATHOLOGIC CHANGES 2. Surgical [[name]], gastric antrum - GASTRIC ANTRAL MUCOSA WITH FEATURES OF REACTIVE GASTROPATHY - NEGATIVE FOR H. PYLORI ON H&E STAIN - NEGATIVE FOR INTESTINAL METAPLASIA OR MALIGNANCY 3. Surgical [[name]], gastric body - GASTRIC OXYNTIC MUCOSA WITH REACTIVE/REPARATIVE CHANGES - NEGATIVE FOR H. PYLORI ON H&E STAIN - NEGATIVE FOR INTESTINAL METAPLASIA, DYSPLASIA OR  MALIGNANCY 4. Surgical [[name]], greater curve ulceration - ADENOCARCINOMA WITH SIGNET RING CELL FEATURES (SEE NOTE) 5. Surgical [[name]], gastric polyps - ADENOCARCINOMA WITH SIGNET RING CELL FEATURES (SEE NOTE) 6. Surgical [[name]], fundus (gastric) - ADENOCARCINOMA WITH SIGNET RING CELL FEATURES (SEE NOTE) 7. Surgical [[name]], colon, ascending, polyp (1) - TUBULAR ADENOMA. - NO HIGH GRADE DYSPLASIA OR MALIGNANCY. 8. Surgical [[name]], colon, transverse, polyp (1) - TUBULAR ADENOMA. - NO HIGH GRADE DYSPLASIA OR MALIGNANCY.    04/13/2022 Initial Diagnosis   Gastric cancer (HCC)   04/19/2022 Cancer Staging   Staging form: Stomach, AJCC 8th Edition - Clinical stage from 04/19/2022: Stage IVB (cT2, cN0, pM1) - Signed by Sheryl Callander, MD on 05/08/2022 Total positive nodes: 0   05/05/2022 Genetic Testing   Negative genetic testing on the Multi-cancer gene panel + RNA.  FH c.259C>T VUS identified.  The report date is May 05, 2022.  The Multi-Cancer + RNA Panel offered by [facility] includes sequencing and/or deletion/duplication analysis of the following 70 genes:  AIP*, ALK, APC*, ATM*, AXIN2*, BAP1*, BARD1*, BLM*, BMPR1A*, BRCA1*, BRCA2*, BRIP1*, CDC73*, CDH1*, CDK4, CDKN1B*, CDKN2A, CHEK2*, CTNNA1*, DICER1*, EPCAM (del/dup only), EGFR, FH*, FLCN*, GREM1 (promoter dup only), HOXB13, KIT, LZTR1, MAX*, MBD4, MEN1*, MET, MITF, MLH1*, MSH2*, MSH3*, MSH6*, MUTYH*, NF1*, NF2*, NTHL1*, PALB2*, PDGFRA, PMS2*, POLD1*, POLE*, POT1*, PRKAR1A*, PTCH1*, PTEN*, RAD51C*, RAD51D*, RB1*, RET, SDHA* (sequencing only), SDHAF2*, SDHB*, SDHC*, SDHD*, SMAD4*, SMARCA4*, SMARCB1*, SMARCE1*, STK11*, SUFU*, TMEM127*, TP53*, TSC1*, TSC2*, VHL*. RNA analysis is performed for * genes.    05/09/2022 - 06/07/2022 Chemotherapy   Patient is on Treatment Plan : GASTROESOPHAGEAL FLOT q14d X 4  cycles      Miscellaneous   Foundation One  Biomarker Findings Microsatellite status- Cannot be determined Tumor Mutational Burden- Cannot be  determined  Genomic Findings  FGFR2 amplification,FGFR2-TACC2 fusion,  Rearrangement intron 17 ARAF amplification CCND3 amplification TP53 V236fs*74     05/30/2022 Imaging    IMPRESSION: 1. Mild hypermetabolism corresponding to a dominant left upper quadrant mass and smaller perigastric nodules or nodes. 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 Recommend attention to the dominant left upper quadrant soft tissue mass on follow-up exams to exclude unlikely recurrent lymphoma. 2. No gastric hypermetabolism and no typical findings of metastatic disease.   06/20/2022 - 11/16/2022 Chemotherapy   Patient is on Treatment Plan : GASTRIC FOLFOX q14d x 12 cycles     08/27/2022 Imaging    IMPRESSION: No focal gastric mass on CT.   No findings suspicious for recurrent or metastatic disease.   Stable left upper abdominal soft tissue lesion and small lymph nodes, chronic, favoring treated lymphoma.   11/27/2022 Imaging    IMPRESSION: 1. Questionable thickening of the distal esophagus/GE junction and gastric antrum, consider further evaluation with endoscopy. 2. Chronically stable left upper quadrant nodularity and prominent lymph nodes again favored treated lymphoma. Continued attention on follow-up imaging suggested. 3. No convincing evidence of metastatic disease in the chest, abdomen or pelvis. 4. Questionable asymmetric wall thickening of the rectum, consider further evaluation with colonoscopy. 5. Mild wall thickening of a nondistended urinary bladder, correlate with urinalysis to exclude cystitis. 6. Hepatic steatosis.   07/10/2023 - 11/27/2023 Chemotherapy   Patient is on Treatment Plan : GASTROESOPHAGEAL Zolbetuximab (800/400) + FOLFOX D1,15,29 q42d x 4 cycles / Zolbetuximab (400) + 5FU + Leucovorin  D1,15,29 q42d     12/17/2023 -  Chemotherapy   Patient is on Treatment Plan : GASTRIC/GE JUNCTION PACLitaxel  D1,8,15 + Ramucirumab  D1,15 q28d         Discussed the use of AI scribe software for clinical note transcription with the patient, who gave verbal consent to proceed.  History of Present Illness Sheryl [name] is a 58 year old female with gastric cancer who presents for follow-up.  She is on a new chemotherapy regimen with weekly treatments for three weeks, including an antibody on the first and third weeks, followed by a week off. She experiences less nausea with this regimen compared to previous treatments, attributing recent nausea to overeating. Her last antibody infusion was on July 30th.  She experiences her usual neuropathy symptoms without new numbness or tingling, managing them with ice, exercise, and stress balls.  She has difficulty maintaining adequate hydration, especially during chemotherapy, and is unsure if she received fluids during her last treatment.  Her stomach pain persists, described as 'hunger pain,' and she uses lidocaine  cream for management, requesting a refill. She is scheduled for chemotherapy sessions next month and prefers earlier appointment times.     All other systems were reviewed with the patient and are negative.  MEDICAL HISTORY:  Past Medical History:  Diagnosis Date   Blood transfusion without reported diagnosis    had transfusion with hysterectomy   Cataract    Colon polyps 2012   Diabetes (HCC) 03/13/2021   Diabetes (HCC) 05/21/2019   Family history of breast cancer    Family history of pancreatic cancer    Family history of stomach cancer    Fibroid    gastric ca 03/2022   GERD (gastroesophageal reflux disease)    H/O blood clots  History of hysterectomy    fibroids and heavy cycles   Hypertension     SURGICAL HISTORY: Past Surgical History:  Procedure Laterality Date   ABDOMINAL HYSTERECTOMY     BIOPSY  04/19/2022   Procedure: BIOPSY;  Surgeon: Wilhelmenia Aloha Raddle., MD;  Location: [facility];  Service: Gastroenterology;;   BIOPSY  03/21/2023    Procedure: BIOPSY;  Surgeon: Wilhelmenia Aloha Raddle., MD;  Location: [facility];  Service: Gastroenterology;;   COLONOSCOPY     ESOPHAGOGASTRODUODENOSCOPY (EGD) WITH PROPOFOL  N/A 04/19/2022   Procedure: ESOPHAGOGASTRODUODENOSCOPY (EGD) WITH PROPOFOL ;  Surgeon: Wilhelmenia Aloha Raddle., MD;  Location: [facility];  Service: Gastroenterology;  Laterality: N/A;   ESOPHAGOGASTRODUODENOSCOPY (EGD) WITH PROPOFOL  N/A 03/21/2023   Procedure: ESOPHAGOGASTRODUODENOSCOPY (EGD) WITH PROPOFOL ;  Surgeon: Wilhelmenia Aloha Raddle., MD;  [facility]: [facility];  Service: Gastroenterology;  Laterality: N/A;   EUS N/A 04/19/2022   Procedure: UPPER ENDOSCOPIC ULTRASOUND (EUS) RADIAL;  Surgeon: Wilhelmenia Aloha Raddle., MD;  Location: [facility];  Service: Gastroenterology;  Laterality: N/A;   EXCISION OF SKIN TAG  05/03/2022   Procedure: EXCISION OF CHEST WALL SKIN LESION;  Surgeon: Dasie Leonor CROME, MD;  Location: [facility];  Service: General;;   LAPAROSCOPY N/A 05/03/2022   Procedure: LAPAROSCOPY DIAGNOSTIC WITH PERITONEAL WASHINGS;  Surgeon: Dasie Leonor CROME, MD;  Location: [facility];  Service: General;  Laterality: N/A;   POLYPECTOMY  04/19/2022   Procedure: POLYPECTOMY;  Surgeon: Wilhelmenia Aloha Raddle., MD;  Location: [facility];  Service: Gastroenterology;;   PORTACATH PLACEMENT N/A 05/03/2022   Procedure: INSERTION PORT-A-CATH WITH ULTRASOUND GUIDANCE;  Surgeon: Dasie Leonor CROME, MD;  Location: [facility];  Service: General;  Laterality: N/A;   UPPER GASTROINTESTINAL ENDOSCOPY      I have reviewed the social history and family history with the patient and they are unchanged from previous note.  ALLERGIES:  is allergic to aspirin, cyclobenzaprine, naproxen sodium, zithromax [azithromycin dihydrate], oxaliplatin , and dilaudid [hydromorphone].  MEDICATIONS:  Current Outpatient Medications  Medication Sig Dispense Refill   acetaminophen  (TYLENOL ) 500 MG tablet Take 2 tablets (1,000 mg total) by mouth every 8 (eight) hours  as needed (pain). 30 tablet 1   amLODipine  (NORVASC ) 10 MG tablet Take 1 tablet (10 mg total) by mouth daily. 30 tablet 2   b complex vitamins capsule Take 1 capsule by mouth daily.     calcium -vitamin D (OSCAL WITH D) 500-5 MG-MCG tablet Take 2 tablets by mouth 2 (two) times daily.     famotidine  (PEPCID ) 20 MG tablet Take 1 tablet (20 mg total) by mouth 2 (two) times daily. 60 tablet 2   HYDROcodone -acetaminophen  (NORCO/VICODIN) 5-325 MG tablet Take 1 tablet by mouth every 6 (six) hours as needed for moderate pain (pain score 4-6). 20 tablet 0   metoCLOPramide  (REGLAN ) 10 MG tablet Take 1 tablet (10 mg total) by mouth every 8 (eight) hours as needed for nausea. 60 tablet 1   pantoprazole  (PROTONIX ) 40 MG tablet Take 1 tablet (40 mg total) by mouth daily. 30 tablet 2   potassium chloride  SA (KLOR-CON  M) 20 MEQ tablet Take 2 tablets (40 mEq total) by mouth 2 (two) times daily for 5 days, THEN 1 tablet (20 mEq total) 2 (two) times daily for 25 days. 80 tablet 0   prochlorperazine  (COMPAZINE ) 10 MG tablet Take 1 tablet (10 mg total) by mouth every 6 (six) hours as needed for nausea or vomiting. 30 tablet 2   promethazine  (PHENERGAN ) 50 MG tablet Take 1 tablet (50 mg total) by mouth 2 (  two) times daily as needed for nausea or vomiting. 45 tablet 1   scopolamine  (TRANSDERM-SCOP) 1 MG/3DAYS Place 1 patch (1.5 mg total) onto the skin every 3 (three) days. 10 patch 1   sucralfate  (CARAFATE ) 1 g tablet Take 1 tablet (1 g total) by mouth 2 (two) times daily. 60 tablet 6   zinc gluconate 50 MG tablet Take 50 mg by mouth daily.     lidocaine -prilocaine  (EMLA ) cream Apply 1 Application topically as needed. 30 g 1   No current facility-administered medications for this visit.   Facility-Administered Medications Ordered in Other Visits  Medication Dose Route Frequency Provider Last Rate Last Admin   0.9 %  sodium chloride  infusion   Intravenous Continuous Sheryl Callander, MD   Stopped at 12/25/23 1228    PHYSICAL  EXAMINATION: ECOG PERFORMANCE STATUS: 2 - Symptomatic, <50% confined to bed  Vitals:   12/25/23 0924  BP: 124/86  Pulse: 96  Resp: 15  Temp: 97.7 F (36.5 C)  SpO2: 99%   Wt Readings from Last 3 Encounters:  12/25/23 165 lb 9.6 oz (75.1 kg)  12/17/23 164 lb 4 oz (74.5 kg)  12/11/23 166 lb 1.6 oz (75.3 kg)     GENERAL:alert, no distress and comfortable SKIN: skin color, texture, turgor are normal, no rashes or significant lesions EYES: normal, Conjunctiva are pink and non-injected, sclera clear NECK: supple, thyroid  normal size, non-tender, without nodularity LYMPH:  no palpable lymphadenopathy in the cervical, axillary  LUNGS: clear to auscultation and percussion with normal breathing effort HEART: regular rate & rhythm and no murmurs and no lower extremity edema ABDOMEN:abdomen soft, non-tender and normal bowel sounds Musculoskeletal:no cyanosis of digits and no clubbing  NEURO: alert & oriented x 3 with fluent speech, no focal motor/sensory deficits  Physical Exam CHEST: Lungs clear to auscultation  LABORATORY DATA:  I have reviewed the data as listed    Latest Ref Rng & Units 12/25/2023    8:55 AM 12/17/2023   10:13 AM 12/11/2023    9:11 AM  CBC  WBC 4.0 - 10.5 K/uL 2.9  3.2  2.9   Hemoglobin 12.0 - 15.0 g/dL 86.8  85.9  86.4   Hematocrit 36.0 - 46.0 % 37.0  39.7  37.9   Platelets 150 - 400 K/uL 362  404  300         Latest Ref Rng & Units 12/25/2023    8:55 AM 12/17/2023   10:13 AM 12/11/2023    9:11 AM  CMP  Glucose 70 - 99 mg/dL 877  862  843   BUN 6 - 20 mg/dL 6  6  7    Creatinine 0.44 - 1.00 mg/dL 9.51  9.29  9.40   Sodium 135 - 145 mmol/L 137  138  136   Potassium 3.5 - 5.1 mmol/L 3.8  3.4  3.7   Chloride 98 - 111 mmol/L 104  100  101   CO2 22 - 32 mmol/L 28  31  29    Calcium  8.9 - 10.3 mg/dL 8.9  9.0  8.7   Total Protein 6.5 - 8.1 g/dL 6.1  6.3  6.1   Total Bilirubin 0.0 - 1.2 mg/dL 0.4  0.4  0.4   Alkaline Phos 38 - 126 U/L 77  85  83   AST 15 - 41  U/L 24  17  16    ALT 0 - 44 U/L 12  9  11        RADIOGRAPHIC STUDIES: I have personally reviewed  the radiological images as listed and agreed with the findings in the report. No results found.    Orders Placed This Encounter  Procedures   CBC with Differential (Cancer Center Only)    Standing Status:   Future    Expected Date:   02/12/2024    Expiration Date:   02/11/2025   CMP (Cancer Center only)    Standing Status:   Future    Expected Date:   02/12/2024    Expiration Date:   02/11/2025   T4    Standing Status:   Future    Expected Date:   02/12/2024    Expiration Date:   02/11/2025   TSH    Standing Status:   Future    Expected Date:   02/12/2024    Expiration Date:   02/11/2025   Total Protein, Urine dipstick    Standing Status:   Future    Expected Date:   02/12/2024    Expiration Date:   02/11/2025   CBC with Differential (Cancer Center Only)    Standing Status:   Future    Expected Date:   02/19/2024    Expiration Date:   02/18/2025   CMP (Cancer Center only)    Standing Status:   Future    Expected Date:   02/19/2024    Expiration Date:   02/18/2025   CBC with Differential (Cancer Center Only)    Standing Status:   Future    Expected Date:   02/26/2024    Expiration Date:   02/25/2025   CMP (Cancer Center only)    Standing Status:   Future    Expected Date:   02/26/2024    Expiration Date:   02/25/2025   All questions were answered. The patient knows to call the clinic with any problems, questions or concerns. No barriers to learning was detected. The total time spent in the appointment was 25 minutes, including review of chart and various tests results, discussions about plan of care and coordination of care plan     Onita Mattock, MD 12/25/2023

## 2023-12-25 NOTE — Progress Notes (Signed)
 Patient here for port flush/labs.  States she received [employer] kit in the mail.  Wanted to know if the kit needed to be drawn today?  Noted in the lab orders that it stated they were done earlier this month.  Spoke with John/CMA and states the kit doesn't need to be done today.  Patient made aware

## 2023-12-27 ENCOUNTER — Encounter

## 2023-12-28 ENCOUNTER — Other Ambulatory Visit: Payer: Self-pay | Admitting: Hematology

## 2023-12-28 DIAGNOSIS — C162 Malignant neoplasm of body of stomach: Secondary | ICD-10-CM

## 2023-12-28 DIAGNOSIS — R1013 Epigastric pain: Secondary | ICD-10-CM

## 2023-12-31 ENCOUNTER — Other Ambulatory Visit (HOSPITAL_COMMUNITY): Payer: Self-pay

## 2023-12-31 NOTE — Assessment & Plan Note (Signed)
 [id number] with peritoneal metastasis. 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   -she started first line chemo FLOT on 05/09/22 -She understands that chemotherapy is palliative, to prolong her life.  We are unlikely going to cure her cancer. -PD-L1 0-1%, very limited benefit from PD-L1 immunotherapy, FO revealed FGFR2 amplification and fusion (+), FGFR inhibitors can be considered in future, no other targeted therapy available  -I changed her chemo from FLOT to FOLFOX on 06/20/2022 -She is not able to return to work due to the cancer and treatment related symptoms.  -I subsequently changed her treatment to maintenance Xeloda  in early August 2024, she is tolerating well overall  -her NGS Caris showed positive Claudin 18.2, she is a candidate for zolbetuximab.  -Repeated EGD on March 21, 2023 showed residual gastric cancer.  We discussed option of changing her chemotherapy back to FOLFOX and add zolbetuximab.  -PET 06/03/2023 showed stable disease (no hypermetabolic disease outside stomach) -Patient developed recurrent abdominal pain, similar to the symptoms she had when she was diagnosed.  She agreed to change treatment back to FOLFOX, and add Zolbetuximab. She started on 07/09/2023. She tolerated first cycle poorly and had prolonged recovery. Oxaliplatin  was stop after cycle 1 due to poor tolerance. She continued 5-fu/LV and zolbe every 2 weeks  -CT 10/10/2023 showed stable disease - Unfortunately PET scan on December 06, 2023 showed worsening peritoneal metastasis.  I recommend change treatment to paclitaxel  and ramucirumab , she started on 12/17/23

## 2024-01-01 ENCOUNTER — Inpatient Hospital Stay: Attending: Physician Assistant

## 2024-01-01 ENCOUNTER — Inpatient Hospital Stay (HOSPITAL_BASED_OUTPATIENT_CLINIC_OR_DEPARTMENT_OTHER): Admitting: Hematology

## 2024-01-01 ENCOUNTER — Other Ambulatory Visit (HOSPITAL_COMMUNITY): Payer: Self-pay

## 2024-01-01 ENCOUNTER — Inpatient Hospital Stay

## 2024-01-01 VITALS — BP 112/84 | HR 100 | Temp 97.8°F | Resp 16 | Ht 68.0 in | Wt 161.5 lb

## 2024-01-01 DIAGNOSIS — R1013 Epigastric pain: Secondary | ICD-10-CM | POA: Diagnosis not present

## 2024-01-01 DIAGNOSIS — Z95828 Presence of other vascular implants and grafts: Secondary | ICD-10-CM

## 2024-01-01 DIAGNOSIS — C786 Secondary malignant neoplasm of retroperitoneum and peritoneum: Secondary | ICD-10-CM | POA: Diagnosis not present

## 2024-01-01 DIAGNOSIS — C169 Malignant neoplasm of stomach, unspecified: Secondary | ICD-10-CM | POA: Diagnosis not present

## 2024-01-01 DIAGNOSIS — Z5111 Encounter for antineoplastic chemotherapy: Secondary | ICD-10-CM | POA: Diagnosis present

## 2024-01-01 DIAGNOSIS — T451X5A Adverse effect of antineoplastic and immunosuppressive drugs, initial encounter: Secondary | ICD-10-CM | POA: Insufficient documentation

## 2024-01-01 DIAGNOSIS — Z5112 Encounter for antineoplastic immunotherapy: Secondary | ICD-10-CM | POA: Insufficient documentation

## 2024-01-01 DIAGNOSIS — Z79633 Long term (current) use of mitotic inhibitor: Secondary | ICD-10-CM | POA: Insufficient documentation

## 2024-01-01 DIAGNOSIS — C162 Malignant neoplasm of body of stomach: Secondary | ICD-10-CM

## 2024-01-01 DIAGNOSIS — G893 Neoplasm related pain (acute) (chronic): Secondary | ICD-10-CM | POA: Diagnosis not present

## 2024-01-01 DIAGNOSIS — C787 Secondary malignant neoplasm of liver and intrahepatic bile duct: Secondary | ICD-10-CM | POA: Diagnosis not present

## 2024-01-01 DIAGNOSIS — D701 Agranulocytosis secondary to cancer chemotherapy: Secondary | ICD-10-CM | POA: Diagnosis not present

## 2024-01-01 LAB — CBC WITH DIFFERENTIAL (CANCER CENTER ONLY)
Abs Immature Granulocytes: 0 K/uL (ref 0.00–0.07)
Basophils Absolute: 0 K/uL (ref 0.0–0.1)
Basophils Relative: 2 %
Eosinophils Absolute: 0.1 K/uL (ref 0.0–0.5)
Eosinophils Relative: 5 %
HCT: 35 % — ABNORMAL LOW (ref 36.0–46.0)
Hemoglobin: 12.5 g/dL (ref 12.0–15.0)
Immature Granulocytes: 0 %
Lymphocytes Relative: 37 %
Lymphs Abs: 0.6 K/uL — ABNORMAL LOW (ref 0.7–4.0)
MCH: 29.7 pg (ref 26.0–34.0)
MCHC: 35.7 g/dL (ref 30.0–36.0)
MCV: 83.1 fL (ref 80.0–100.0)
Monocytes Absolute: 0.2 K/uL (ref 0.1–1.0)
Monocytes Relative: 12 %
Neutro Abs: 0.8 K/uL — ABNORMAL LOW (ref 1.7–7.7)
Neutrophils Relative %: 44 %
Platelet Count: 340 K/uL (ref 150–400)
RBC: 4.21 MIL/uL (ref 3.87–5.11)
RDW: 13.8 % (ref 11.5–15.5)
WBC Count: 1.7 K/uL — ABNORMAL LOW (ref 4.0–10.5)
nRBC: 0 % (ref 0.0–0.2)

## 2024-01-01 LAB — CMP (CANCER CENTER ONLY)
ALT: 12 U/L (ref 0–44)
AST: 18 U/L (ref 15–41)
Albumin: 3.5 g/dL (ref 3.5–5.0)
Alkaline Phosphatase: 70 U/L (ref 38–126)
Anion gap: 6 (ref 5–15)
BUN: 7 mg/dL (ref 6–20)
CO2: 28 mmol/L (ref 22–32)
Calcium: 9 mg/dL (ref 8.9–10.3)
Chloride: 104 mmol/L (ref 98–111)
Creatinine: 0.41 mg/dL — ABNORMAL LOW (ref 0.44–1.00)
GFR, Estimated: >60 mL/min (ref >60)
Glucose, Bld: 149 mg/dL — ABNORMAL HIGH (ref 70–99)
Potassium: 3.7 mmol/L (ref 3.5–5.1)
Sodium: 138 mmol/L (ref 135–145)
Total Bilirubin: 0.5 mg/dL (ref 0.0–1.2)
Total Protein: 6.3 g/dL — ABNORMAL LOW (ref 6.5–8.1)

## 2024-01-01 MED ORDER — SODIUM CHLORIDE 0.9% FLUSH
10.0000 mL | Freq: Once | INTRAVENOUS | Status: AC
  Administered 2024-01-01: 10 mL

## 2024-01-01 MED ORDER — POTASSIUM CHLORIDE CRYS ER 10 MEQ PO TBCR
10.0000 meq | EXTENDED_RELEASE_TABLET | Freq: Two times a day (BID) | ORAL | 1 refills | Status: DC
Start: 2024-01-01 — End: 2024-02-12
  Filled 2024-01-01: qty 60, 30d supply, fill #0
  Filled 2024-01-31: qty 60, 30d supply, fill #1

## 2024-01-01 MED ORDER — SODIUM CHLORIDE 0.9 % IV SOLN
80.0000 mg/m2 | Freq: Once | INTRAVENOUS | Status: AC
  Administered 2024-01-01: 150 mg via INTRAVENOUS
  Filled 2024-01-01: qty 25

## 2024-01-01 MED ORDER — DIPHENHYDRAMINE HCL 50 MG/ML IJ SOLN
25.0000 mg | Freq: Once | INTRAMUSCULAR | Status: AC
  Administered 2024-01-01: 25 mg via INTRAVENOUS
  Filled 2024-01-01: qty 1

## 2024-01-01 MED ORDER — SODIUM CHLORIDE 0.9 % IV SOLN: INTRAVENOUS | Status: AC

## 2024-01-01 MED ORDER — ACETAMINOPHEN 325 MG PO TABS
650.0000 mg | ORAL_TABLET | Freq: Once | ORAL | Status: DC
  Filled 2024-01-01: qty 2

## 2024-01-01 MED ORDER — SODIUM CHLORIDE 0.9 % IV SOLN
8.0000 mg/kg | Freq: Once | INTRAVENOUS | Status: AC
  Administered 2024-01-01: 600 mg via INTRAVENOUS
  Filled 2024-01-01: qty 10

## 2024-01-01 MED ORDER — DEXAMETHASONE SODIUM PHOSPHATE 10 MG/ML IJ SOLN
10.0000 mg | Freq: Once | INTRAMUSCULAR | Status: AC
  Administered 2024-01-01: 10 mg via INTRAVENOUS
  Filled 2024-01-01: qty 1

## 2024-01-01 MED ORDER — FAMOTIDINE IN NACL 20-0.9 MG/50ML-% IV SOLN
20.0000 mg | Freq: Once | INTRAVENOUS | Status: AC
  Administered 2024-01-01: 20 mg via INTRAVENOUS
  Filled 2024-01-01: qty 50

## 2024-01-01 MED ORDER — HYDROCODONE-ACETAMINOPHEN 5-325 MG PO TABS
1.0000 | ORAL_TABLET | Freq: Two times a day (BID) | ORAL | 0 refills | Status: DC | PRN
  Filled 2024-01-01: qty 30, 15d supply, fill #0

## 2024-01-01 NOTE — Progress Notes (Signed)
 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   -she started first line chemo FLOT on 05/09/22 -She understands that chemotherapy is palliative, to prolong her life.  We are unlikely going to cure her cancer. -PD-L1 0-1%, very limited benefit from PD-L1 immunotherapy, FO revealed FGFR2 amplification and fusion (+), FGFR inhibitors can be considered in future, no other targeted therapy available  -I changed her chemo from FLOT to FOLFOX on 06/20/2022 -She is not able to return to work due to the cancer and treatment related symptoms.  -I subsequently changed her treatment to maintenance Xeloda  in early August 2024, she is tolerating well overall  -her NGS Caris showed positive Claudin 18.2, she is a candidate for zolbetuximab.  -Repeated EGD on March 21, 2023 showed residual gastric cancer.  We discussed option of changing her chemotherapy back to FOLFOX and add zolbetuximab.  -PET 06/03/2023 showed stable disease (no hypermetabolic disease outside stomach) -Patient developed recurrent abdominal pain, similar to the symptoms she had when she was diagnosed.  She agreed to change treatment back to FOLFOX, and add Zolbetuximab. She started on 07/09/2023. She tolerated first cycle poorly and had prolonged recovery. Oxaliplatin  was stop after cycle 1 due to poor tolerance. She  continued 5-fu/LV and zolbe every 2 weeks  -CT 10/10/2023 showed stable disease - Unfortunately PET scan on December 06, 2023 showed worsening peritoneal metastasis.  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 The goal of therapy is to control the cancer and prolong life. Current treatment regimen requires at least two months to assess response. - Schedule CT scan in three months to assess treatment response  Cancer-related pain Persistent cancer-related pain primarily in the stomach area, with a palpable lump increasing in size. Pain is managed with hydrocodone . - Prescribe 30 tablets of hydrocodone  for pain management  Dysphagia Dysphagia with difficulty swallowing solid foods, including soft foods like yogurt. Potential mechanical obstruction or muscular weakness suspected. - Refer to speech therapist for swallow function evaluation  Abnormal weight loss Weight loss from 165 lbs to 161 lbs, attributed to dysphagia and reduced oral intake. - Encourage intake of 3-4 Glycine nutritional drinks daily to maintain weight - Schedule follow-up with dietitian in two weeks  Neutropenia secondary to chemotherapy Neutropenia with a white blood cell count of 1.7, secondary to chemotherapy. No current infection, but low immune function necessitates precautions. - Advise wearing a mask and avoiding sick contacts - Monitor white blood cell count in two weeks - Consider growth factor injection if white blood cell count remains low  Hypokalemia Previous hypokalemia with current potassium levels pending. Difficulty swallowing potassium pills noted. - Prescribe smaller potassium pills, one twice a day - Monitor potassium levels  Plan - Lab reviewed, ANC 0.8, okay to treat, will  proceed  to cycle 1 day 15 paclitaxel  and ramucirumab  today.  Patient is not interested in G-CSF at this point. - Follow-up in 2 weeks for cycle 2-day 1 treatment - I called in hydrocodone  for her, and refilled KCL at lower dose    SUMMARY OF ONCOLOGIC HISTORY: Oncology History Overview Note   Cancer Staging  Gastric cancer [facility]) Staging form: Stomach, AJCC 8th Edition - Clinical stage from 04/19/2022: Stage IVB (cT2, cN0, pM1) - Signed by Sheryl Callander, MD on 05/08/2022 Total positive nodes: 0     Gastric cancer (HCC)  03/30/2022 Procedure   EGD:  Impression:  - Normal esophagus. - A few gastric polyps. Biopsied. - Gastritis. Biopsied. - Non-bleeding gastric ulcer with no stigmata of bleeding. Biopsied. - Normal examined duodenum. Biopsied.  Findings: Diffuse moderate inflammation characterized by congestion (edema), friability and granularity was found in the cardia, in the gastric fundus and in the gastric body. There were associated erosions in multiple places. Biopsies were taken from the antrum, body, and fundus with a cold forceps for histology. Estimated blood loss was minimal.  One non-bleeding cratered gastric ulcer with no stigmata of bleeding was found on the greater curvature of the stomach. The lesion was 6 mm in largest dimension. The mucosa around the ulcer was heaped and led to some deformity in the antrum. Biopsies were taken with a cold forceps for histology. Estimated blood loss was minimal.    03/30/2022 Pathology Results   Patient: [name], [name]  Diagnosis 1. Surgical [P], duodenal - BENIGN SMALL BOWEL MUCOSA WITH NO SIGNIFICANT PATHOLOGIC CHANGES 2. Surgical [P], gastric antrum - GASTRIC ANTRAL MUCOSA WITH FEATURES OF REACTIVE GASTROPATHY - NEGATIVE FOR H. PYLORI ON H&E STAIN - NEGATIVE FOR INTESTINAL METAPLASIA OR MALIGNANCY 3. Surgical [P], gastric body - GASTRIC OXYNTIC MUCOSA WITH REACTIVE/REPARATIVE CHANGES - NEGATIVE FOR  H. PYLORI ON H&E STAIN - NEGATIVE FOR INTESTINAL METAPLASIA, DYSPLASIA OR MALIGNANCY 4. Surgical [P], greater curve ulceration - ADENOCARCINOMA WITH SIGNET RING CELL FEATURES (SEE NOTE) 5. Surgical [P], gastric polyps - ADENOCARCINOMA WITH SIGNET RING CELL FEATURES (SEE NOTE) 6. Surgical [P], fundus (gastric) - ADENOCARCINOMA WITH SIGNET RING CELL FEATURES (SEE NOTE) 7. Surgical [P], colon, ascending, polyp (1) - TUBULAR ADENOMA. - NO HIGH GRADE DYSPLASIA OR MALIGNANCY. 8. Surgical [P], colon, transverse, polyp (1) - TUBULAR ADENOMA. - NO HIGH GRADE DYSPLASIA OR MALIGNANCY.    04/13/2022 Initial Diagnosis   Gastric cancer (HCC)   04/19/2022 Cancer Staging   Staging form: Stomach, AJCC 8th Edition - Clinical stage from 04/19/2022: Stage IVB (cT2, cN0, pM1) - Signed by Sheryl Callander, MD on 05/08/2022 Total positive nodes: 0   05/05/2022 Genetic Testing   Negative genetic testing on the Multi-cancer gene panel + RNA.  FH c.259C>T VUS identified.  The report date is May 05, 2022.  The Multi-Cancer + RNA Panel offered by [facility] includes sequencing and/or deletion/duplication analysis of the following 70 genes:  AIP*, ALK, APC*, ATM*, AXIN2*, BAP1*, BARD1*, BLM*, BMPR1A*, BRCA1*, BRCA2*, BRIP1*, CDC73*, CDH1*, CDK4, CDKN1B*, CDKN2A, CHEK2*, CTNNA1*, DICER1*, EPCAM (del/dup only), EGFR, FH*, FLCN*, GREM1 (promoter dup only), HOXB13, KIT, LZTR1, MAX*, MBD4, MEN1*, MET, MITF, MLH1*, MSH2*, MSH3*, MSH6*, MUTYH*, NF1*, NF2*, NTHL1*, PALB2*, PDGFRA, PMS2*, POLD1*, POLE*, POT1*, PRKAR1A*, PTCH1*, PTEN*, RAD51C*, RAD51D*, RB1*, RET, SDHA* (sequencing only), SDHAF2*, SDHB*, SDHC*, SDHD*, SMAD4*, SMARCA4*, SMARCB1*, SMARCE1*, STK11*, SUFU*, TMEM127*, TP53*, TSC1*, TSC2*, VHL*. RNA analysis is performed for * genes.    05/09/2022 - 06/07/2022 Chemotherapy   Patient is on  Treatment Plan : GASTROESOPHAGEAL FLOT q14d X 4 cycles      Miscellaneous   Foundation One  Biomarker Findings Microsatellite status-  Cannot be determined Tumor Mutational Burden- Cannot be determined  Genomic Findings  FGFR2 amplification,FGFR2-TACC2 fusion,  Rearrangement intron 17 ARAF amplification CCND3 amplification TP53 V263fs*74     05/30/2022 Imaging    IMPRESSION: 1. Mild hypermetabolism corresponding to a dominant left upper quadrant mass and smaller perigastric nodules or nodes. 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 Recommend attention to the dominant left upper quadrant soft tissue mass on follow-up exams to exclude unlikely recurrent lymphoma. 2. No gastric hypermetabolism and no typical findings of metastatic disease.   06/20/2022 - 11/16/2022 Chemotherapy   Patient is on Treatment Plan : GASTRIC FOLFOX q14d x 12 cycles     08/27/2022 Imaging    IMPRESSION: No focal gastric mass on CT.   No findings suspicious for recurrent or metastatic disease.   Stable left upper abdominal soft tissue lesion and small lymph nodes, chronic, favoring treated lymphoma.   11/27/2022 Imaging    IMPRESSION: 1. Questionable thickening of the distal esophagus/GE junction and gastric antrum, consider further evaluation with endoscopy. 2. Chronically stable left upper quadrant nodularity and prominent lymph nodes again favored treated lymphoma. Continued attention on follow-up imaging suggested. 3. No convincing evidence of metastatic disease in the chest, abdomen or pelvis. 4. Questionable asymmetric wall thickening of the rectum, consider further evaluation with colonoscopy. 5. Mild wall thickening of a nondistended urinary bladder, correlate with urinalysis to exclude cystitis. 6. Hepatic steatosis.   07/10/2023 - 11/27/2023 Chemotherapy   Patient is on Treatment Plan : GASTROESOPHAGEAL Zolbetuximab (800/400) + FOLFOX D1,15,29 q42d x 4 cycles / Zolbetuximab (400) + 5FU + Leucovorin  D1,15,29 q42d     12/17/2023 -  Chemotherapy   Patient is on Treatment Plan :  GASTRIC/GE JUNCTION PACLitaxel  D1,8,15 + Ramucirumab  D1,15 q28d        Discussed the use of AI scribe software for clinical note transcription with the patient, who gave verbal consent to proceed.  History of Present Illness Sheryl [name] is a 58 year old female with metastatic gastric cancer who presents for follow-up.  She experiences dysphagia with solid foods, including soft foods like bananas and yogurt, requiring liquids to aid swallowing. Crushing medication and mixing it with yogurt has not helped. Liquids are swallowed without difficulty.  Daily abdominal pain is present, along with a palpable, enlarging abdominal mass, now noticeable even when sitting.  Weight has decreased from 165 to 161 pounds. She consumes two Glycine nutritional drinks per day and is scheduled to see a dietitian in two weeks.  Her white blood cell count is low at 1.7, with no growth factor injections administered. She takes potassium supplements but has difficulty swallowing the large pills and has requested a smaller dose. Hydrocodone  is taken for pain management, with no allergic reactions to medications.     All other systems were reviewed with the patient and are negative.  MEDICAL HISTORY:  Past Medical History:  Diagnosis Date   Blood transfusion without reported diagnosis    had transfusion with hysterectomy   Cataract    Colon polyps 2012   Diabetes (HCC) 03/13/2021   Diabetes (HCC) 05/21/2019   Family history of breast cancer    Family history of pancreatic cancer    Family history of stomach cancer    Fibroid    gastric ca 03/2022   GERD (gastroesophageal reflux  disease)    H/O blood clots    History of hysterectomy    fibroids and heavy cycles   Hypertension     SURGICAL HISTORY: Past Surgical History:  Procedure Laterality Date   ABDOMINAL HYSTERECTOMY     BIOPSY  04/19/2022   Procedure: BIOPSY;  Surgeon: Wilhelmenia Aloha Raddle., MD;  Location: [facility];  Service:  Gastroenterology;;   BIOPSY  03/21/2023   Procedure: BIOPSY;  Surgeon: Wilhelmenia Aloha Raddle., MD;  Location: [facility];  Service: Gastroenterology;;   COLONOSCOPY     ESOPHAGOGASTRODUODENOSCOPY (EGD) WITH PROPOFOL  N/A 04/19/2022   Procedure: ESOPHAGOGASTRODUODENOSCOPY (EGD) WITH PROPOFOL ;  Surgeon: Wilhelmenia Aloha Raddle., MD;  Location: [facility];  Service: Gastroenterology;  Laterality: N/A;   ESOPHAGOGASTRODUODENOSCOPY (EGD) WITH PROPOFOL  N/A 03/21/2023   Procedure: ESOPHAGOGASTRODUODENOSCOPY (EGD) WITH PROPOFOL ;  Surgeon: Wilhelmenia Aloha Raddle., MD;  Location: [facility];  Service: Gastroenterology;  Laterality: N/A;   EUS N/A 04/19/2022   Procedure: UPPER ENDOSCOPIC ULTRASOUND (EUS) RADIAL;  Surgeon: Wilhelmenia Aloha Raddle., MD;  Location: [facility];  Service: Gastroenterology;  Laterality: N/A;   EXCISION OF SKIN TAG  05/03/2022   Procedure: EXCISION OF CHEST WALL SKIN LESION;  Surgeon: Dasie Leonor CROME, MD;  Location: [facility];  Service: General;;   LAPAROSCOPY N/A 05/03/2022   Procedure: LAPAROSCOPY DIAGNOSTIC WITH PERITONEAL WASHINGS;  Surgeon: Dasie Leonor CROME, MD;  Location: [facility];  Service: General;  Laterality: N/A;   POLYPECTOMY  04/19/2022   Procedure: POLYPECTOMY;  Surgeon: Wilhelmenia Aloha Raddle., MD;  Location: [facility];  Service: Gastroenterology;;   PORTACATH PLACEMENT N/A 05/03/2022   Procedure: INSERTION PORT-A-CATH WITH ULTRASOUND GUIDANCE;  Surgeon: Dasie Leonor CROME, MD;  Location: [facility];  Service: General;  Laterality: N/A;   UPPER GASTROINTESTINAL ENDOSCOPY      I have reviewed the social history and family history with the patient and they are unchanged from previous note.  ALLERGIES:  is allergic to aspirin, cyclobenzaprine, naproxen sodium, zithromax [azithromycin dihydrate], oxaliplatin , and dilaudid [hydromorphone].  MEDICATIONS:  Current Outpatient Medications  Medication Sig Dispense Refill   acetaminophen  (TYLENOL ) 500 MG tablet Take 2 tablets  (1,000 mg total) by mouth every 8 (eight) hours as needed (pain). 30 tablet 1   amLODipine  (NORVASC ) 10 MG tablet Take 1 tablet (10 mg total) by mouth daily. 30 tablet 2   b complex vitamins capsule Take 1 capsule by mouth daily.     calcium -vitamin D (OSCAL WITH D) 500-5 MG-MCG tablet Take 2 tablets by mouth 2 (two) times daily.     famotidine  (PEPCID ) 20 MG tablet Take 1 tablet (20 mg total) by mouth 2 (two) times daily. 60 tablet 2   lidocaine -prilocaine  (EMLA ) cream Apply 1 Application topically as needed. 30 g 1   metoCLOPramide  (REGLAN ) 10 MG tablet Take 1 tablet (10 mg total) by mouth every 8 (eight) hours as needed for nausea. 60 tablet 1   pantoprazole  (PROTONIX ) 40 MG tablet Take 1 tablet (40 mg total) by mouth daily. 30 tablet 2   potassium chloride  (KLOR-CON  M) 10 MEQ tablet Take 1 tablet (10 mEq total) by mouth 2 (two) times daily. 60 tablet 1   prochlorperazine  (COMPAZINE ) 10 MG tablet Take 1 tablet (10 mg total) by mouth every 6 (six) hours as needed for nausea or vomiting. 30 tablet 2   promethazine  (PHENERGAN ) 50 MG tablet Take 1 tablet (50 mg total) by mouth 2 (two) times daily as needed for nausea or vomiting. 45 tablet 1   scopolamine  (TRANSDERM-SCOP) 1 MG/3DAYS Place 1  patch (1.5 mg total) onto the skin every 3 (three) days. 10 patch 1   sucralfate  (CARAFATE ) 1 g tablet Take 1 tablet (1 g total) by mouth 2 (two) times daily. 60 tablet 6   zinc gluconate 50 MG tablet Take 50 mg by mouth daily.     HYDROcodone -acetaminophen  (NORCO/VICODIN) 5-325 MG tablet Take 1 tablet by mouth every 12 (twelve) hours as needed for moderate pain (pain score 4-6). 30 tablet 0   No current facility-administered medications for this visit.   Facility-Administered Medications Ordered in Other Visits  Medication Dose Route Frequency Provider Last Rate Last Admin   acetaminophen  (TYLENOL ) tablet 650 mg  650 mg Oral Once Sheryl Callander, MD       PACLitaxel  (TAXOL ) 150 mg in sodium chloride  0.9 % 250 mL  chemo infusion (</= 80mg /m2)  80 mg/m2 (Treatment Plan Recorded) Intravenous Once Sheryl Callander, MD        PHYSICAL EXAMINATION: ECOG PERFORMANCE STATUS: 2 - Symptomatic, <50% confined to bed  Vitals:   01/01/24 1000  BP: 112/84  Pulse: 100  Resp: 16  Temp: 97.8 F (36.6 C)  SpO2: 98%   Wt Readings from Last 3 Encounters:  01/01/24 161 lb 8 oz (73.3 kg)  12/25/23 165 lb 9.6 oz (75.1 kg)  12/17/23 164 lb 4 oz (74.5 kg)     GENERAL:alert, no distress and comfortable SKIN: skin color, texture, turgor are normal, no rashes or significant lesions EYES: normal, Conjunctiva are pink and non-injected, sclera clear NECK: supple, thyroid  normal size, non-tender, without nodularity LYMPH:  no palpable lymphadenopathy in the cervical, axillary  LUNGS: clear to auscultation and percussion with normal breathing effort HEART: regular rate & rhythm and no murmurs and no lower extremity edema ABDOMEN:abdomen soft, non-tender and normal bowel sounds Musculoskeletal:no cyanosis of digits and no clubbing  NEURO: alert & oriented x 3 with fluent speech, no focal motor/sensory deficits  Physical Exam MEASUREMENTS: Weight- 161.  LABORATORY DATA:  I have reviewed the data as listed    Latest Ref Rng & Units 01/01/2024   10:42 AM 12/25/2023    8:55 AM 12/17/2023   10:13 AM  CBC  WBC 4.0 - 10.5 K/uL 1.7  2.9  3.2   Hemoglobin 12.0 - 15.0 g/dL 87.4  86.8  85.9   Hematocrit 36.0 - 46.0 % 35.0  37.0  39.7   Platelets 150 - 400 K/uL 340  362  404         Latest Ref Rng & Units 01/01/2024   10:42 AM 12/25/2023    8:55 AM 12/17/2023   10:13 AM  CMP  Glucose 70 - 99 mg/dL 850  877  862   BUN 6 - 20 mg/dL 7  6  6    Creatinine 0.44 - 1.00 mg/dL 9.58  9.51  9.29   Sodium 135 - 145 mmol/L 138  137  138   Potassium 3.5 - 5.1 mmol/L 3.7  3.8  3.4   Chloride 98 - 111 mmol/L 104  104  100   CO2 22 - 32 mmol/L 28  28  31    Calcium  8.9 - 10.3 mg/dL 9.0  8.9  9.0   Total Protein 6.5 - 8.1 g/dL 6.3  6.1  6.3    Total Bilirubin 0.0 - 1.2 mg/dL 0.5  0.4  0.4   Alkaline Phos 38 - 126 U/L 70  77  85   AST 15 - 41 U/L 18  24  17    ALT 0 -  44 U/L 12  12  9        RADIOGRAPHIC STUDIES: I have personally reviewed the radiological images as listed and agreed with the findings in the report. No results found.    Orders Placed This Encounter  Procedures   Ambulatory referral to Speech Therapy    Referral Priority:   Routine    Referral Type:   Speech Therapy    Referral Reason:   Specialty Services Required    Requested Specialty:   Speech Pathology    Number of Visits Requested:   1   All questions were answered. The patient knows to call the clinic with any problems, questions or concerns. No barriers to learning was detected. The total time spent in the appointment was 30 minutes, including review of chart and various tests results, discussions about plan of care and coordination of care plan     Onita Mattock, MD 01/01/2024

## 2024-01-02 ENCOUNTER — Other Ambulatory Visit (HOSPITAL_COMMUNITY): Payer: Self-pay

## 2024-01-10 ENCOUNTER — Other Ambulatory Visit: Payer: Self-pay | Admitting: Nurse Practitioner

## 2024-01-13 ENCOUNTER — Other Ambulatory Visit: Payer: Self-pay

## 2024-01-13 ENCOUNTER — Other Ambulatory Visit (HOSPITAL_COMMUNITY): Payer: Self-pay

## 2024-01-13 MED ORDER — PROMETHAZINE HCL 50 MG PO TABS
50.0000 mg | ORAL_TABLET | Freq: Two times a day (BID) | ORAL | 1 refills | Status: DC | PRN
  Filled 2024-01-13: qty 45, 23d supply, fill #0
  Filled 2024-02-23: qty 45, 23d supply, fill #1

## 2024-01-14 NOTE — Assessment & Plan Note (Addendum)
 [id number] with peritoneal metastasis. 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   -she started first line chemo FLOT on 05/09/22 -She understands that chemotherapy is palliative, to prolong her life.  We are unlikely going to cure her cancer. -PD-L1 0-1%, very limited benefit from PD-L1 immunotherapy, FO revealed FGFR2 amplification and fusion (+), FGFR inhibitors can be considered in future, no other targeted therapy available  -Her chemo was changed from FLOT to FOLFOX on 06/20/2022 -She is not able to return to work due to the cancer and treatment related symptoms. -her chemotherapy was subsequently changed to maintenance Xeloda  in early August 2024, she is tolerating well overall  -her NGS Caris showed positive Claudin 18.2, she is a candidate for zolbetuximab.  -Repeated EGD on March 21, 2023 showed residual gastric cancer.  We discussed option of changing her chemotherapy back to FOLFOX and add zolbetuximab.  -PET 06/03/2023 showed stable disease (no hypermetabolic disease outside stomach) -Patient developed recurrent abdominal pain, similar to the symptoms she had when she was diagnosed.  She agreed to change treatment back to FOLFOX, and add Zolbetuximab. She started on 07/09/2023. She tolerated first cycle poorly and had prolonged recovery. Oxaliplatin  was stop after cycle 1 due to poor tolerance. She continued 5-fu/LV and zolbe every 2 weeks  -CT 10/10/2023 showed stable disease - Unfortunately PET scan on December 06, 2023 showed worsening peritoneal metastasis. Change in chemotherapy treatment recommended to paclitaxel  and ramucirumab , she started on 12/17/23. -- Overall, tolerating this well.  01/15/2024, developed neutropenia, thus dose reduce paclitaxel  to 65 mg/m.  Udenyca  added to D3C2.  Will continue to monitor closely  and treat additional neutropenia as indicated.

## 2024-01-14 NOTE — Progress Notes (Signed)
 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   -she started first line chemo FLOT on 05/09/22 -She understands that chemotherapy is palliative, to prolong her life.  We are unlikely going to cure her cancer. -PD-L1 0-1%, very limited benefit from PD-L1 immunotherapy, FO revealed FGFR2 amplification and fusion (+), FGFR inhibitors can be considered in future, no other targeted therapy available  -Her chemo was changed from FLOT to FOLFOX on 06/20/2022 -She is not able to return to work due to the cancer and treatment related symptoms. -her chemotherapy was subsequently changed to maintenance Xeloda  in early August 2024, she is tolerating well overall  -her NGS Caris showed positive Claudin 18.2, she is a candidate for zolbetuximab.  -Repeated EGD on March 21, 2023 showed residual gastric cancer.  We discussed option of changing her chemotherapy back to FOLFOX and add zolbetuximab.  -PET 06/03/2023 showed stable disease (no hypermetabolic disease outside stomach) -Patient developed recurrent abdominal pain, similar to the symptoms she had when she was diagnosed.  She agreed to change treatment back to FOLFOX, and add Zolbetuximab. She started on 07/09/2023. She tolerated first cycle poorly and had prolonged recovery. Oxaliplatin  was stop after cycle 1 due to poor tolerance. She continued 5-fu/LV and zolbe every 2 weeks  -CT 10/10/2023 showed stable disease - Unfortunately PET scan on December 06, 2023 showed worsening peritoneal metastasis. Change  in chemotherapy treatment recommended to paclitaxel  and ramucirumab , she started on 12/17/23. -- Overall, tolerating this well.  01/15/2024, developed neutropenia, thus dose reduce paclitaxel  to 65 mg/m.  Udenyca  added to D3C2.  Will continue to monitor closely and treat additional neutropenia as indicated.   Neutropenia Today, WBC 2.0 and ANC 0.6.  She has no signs or symptoms of infection today.  Denies fever, chills, night sweats, or unintentional weight loss.  Will decrease dose paclitaxel  to 65 mg/m.  Add Udenyca  on day 3 of treatment for immune system support.  Closely monitor labs with every visit and readjust dosing paclitaxel  and administer Udenyca  on as needed basis.  Hypokalemia Potassium 3.0 today.  Will give 10 mEq potassium during chemotherapy treatment today.  Increase oral potassium 10 mEq to twice daily.  Recheck with every visit and adjust oral potassium and administer IV potassium replacement as needed.  Peripheral neuropathy This is most severe in the right foot.  States she has to pay attention when taking steps.  If she does not correctly, right leg will sometimes give out.  Has had a few episodes of almost falling.  At this point, she is not ready to use cane or walker.  We discussed safety first.  If feeling unsteady on her feet safer to proceed with cane or walking stick rather than fall.  Fall could lead to fractures and unexpected hospitalizations, thus complicating treatment.  She voiced understanding and stated she would try.  Plan Labs reviewed. - Moderate neutropenia and mild anemia. -- Reduce paclitaxel  dosing to 65 mg/m.  Add Udenyca  on day 3 of current cycle for growth factor support. - Hypokalemia with potassium at 3.0 today.  Administer 10 mEq KCl during treatment  of chemotherapy today.  Increase oral potassium 10 mEq to twice daily. Proceed with cycle 2 day 1 chemotherapy Taxol  and Cyramza  with dose reduced Taxol .  Administer Udenyca  on day 3 of current  cycle. Labs/flush, follow-up, and next treatment as scheduled on 01/22/2024.  The patient understands the plans discussed today and is in agreement with them.  She knows to contact our office if she develops concerns prior to her next appointment.  I provided 30 minutes of face-to-face time during this encounter and > 50% was spent counseling as documented under my assessment and plan.    Powell FORBES Lessen, NP  [facility] - A DEPT OF [name] [address] [place] [address] Dept: [phone] Dept Fax: [phone]   No orders of the defined types were placed in this encounter.     CHIEF COMPLAINT:  CC: Metastatic gastric cancer  Current Treatment: Second line palliative chemotherapy paclitaxel  and ramucirumab   INTERVAL HISTORY:  Sheryl [name] is here today for repeat clinical assessment.  She was last seen by Dr.Feng on 01/01/2024.  She does have persistent cancer related pain, mostly in the stomach.  There is a palpable mass which is increasing in size.  Pain has been managed with hydrocodone .  She has also had difficulty swallowing and thus, unintentional weight loss.  Today, she presents for cycle 2 day 1 chemotherapy with paclitaxel  and ramucirumab .  Has noticed some increased peripheral neuropathy, especially in her right foot.  Really having to pay attention to her steps.  Gets off balance easily and has almost fallen a few times.  Currently not willing to use a cane or walking stick.  She denies chest pain, chest pressure, or shortness of breath. She denies headaches or visual disturbances. She denies nausea, vomiting, or changes in bowel or bladder habits. She denies fevers or chills. Her appetite is improved. Her weight has decreased 2 pounds over last 2 weeks.  I have reviewed the past medical history, past surgical history, social history and family history with the patient and they are unchanged from previous  note.  ALLERGIES:  is allergic to aspirin, cyclobenzaprine, naproxen sodium, zithromax [azithromycin dihydrate], oxaliplatin , and dilaudid [hydromorphone].  MEDICATIONS:  Current Outpatient Medications  Medication Sig Dispense Refill   acetaminophen  (TYLENOL ) 500 MG tablet Take 2 tablets (1,000 mg total) by mouth every 8 (eight) hours as needed (pain). 30 tablet 1   amLODipine  (NORVASC ) 10 MG tablet Take 1 tablet (10 mg total) by mouth daily. 30 tablet 2   b complex vitamins capsule Take 1 capsule by mouth daily.     calcium -vitamin D (OSCAL WITH D) 500-5 MG-MCG tablet Take 2 tablets by mouth 2 (two) times daily.     famotidine  (PEPCID ) 20 MG tablet Take 1 tablet (20 mg total) by mouth 2 (two) times daily. 60 tablet 2   HYDROcodone -acetaminophen  (NORCO/VICODIN) 5-325 MG tablet Take 1 tablet by mouth every 12 (twelve) hours as needed for moderate pain (pain score 4-6). 30 tablet 0   lidocaine -prilocaine  (EMLA ) cream Apply 1 Application topically as needed. 30 g 1   metoCLOPramide  (REGLAN ) 10 MG tablet Take 1 tablet (10 mg total) by mouth every 8 (eight) hours as needed for nausea. 60 tablet 1   pantoprazole  (PROTONIX ) 40 MG tablet Take 1 tablet (40 mg total) by mouth daily. 30 tablet 2   potassium chloride  (KLOR-CON  M) 10 MEQ tablet Take 1 tablet (10 mEq total) by mouth 2 (  two) times daily. 60 tablet 1   prochlorperazine  (COMPAZINE ) 10 MG tablet Take 1 tablet (10 mg total) by mouth every 6 (six) hours as needed for nausea or vomiting. 30 tablet 2   promethazine  (PHENERGAN ) 50 MG tablet Take 1 tablet (50 mg total) by mouth 2 (two) times daily as needed for nausea or vomiting. 45 tablet 1   scopolamine  (TRANSDERM-SCOP) 1 MG/3DAYS Place 1 patch (1.5 mg total) onto the skin every 3 (three) days. 10 patch 1   sucralfate  (CARAFATE ) 1 g tablet Take 1 tablet (1 g total) by mouth 2 (two) times daily. 60 tablet 6   zinc gluconate 50 MG tablet Take 50 mg by mouth daily.     No current  facility-administered medications for this visit.    HISTORY OF PRESENT ILLNESS:   Oncology History Overview Note   Cancer Staging  Gastric cancer [facility]) Staging form: Stomach, AJCC 8th Edition - Clinical stage from 04/19/2022: Stage IVB (cT2, cN0, pM1) - Signed by [name], MD on 05/08/2022 Total positive nodes: 0     Gastric cancer (HCC)  03/30/2022 Procedure   EGD:  Impression:  - Normal esophagus. - A few gastric polyps. Biopsied. - Gastritis. Biopsied. - Non-bleeding gastric ulcer with no stigmata of bleeding. Biopsied. - Normal examined duodenum. Biopsied.  Findings: Diffuse moderate inflammation characterized by congestion (edema), friability and granularity was found in the cardia, in the gastric fundus and in the gastric body. There were associated erosions in multiple places. Biopsies were taken from the antrum, body, and fundus with a cold forceps for histology. Estimated blood loss was minimal.  One non-bleeding cratered gastric ulcer with no stigmata of bleeding was found on the greater curvature of the stomach. The lesion was 6 mm in largest dimension. The mucosa around the ulcer was heaped and led to some deformity in the antrum. Biopsies were taken with a cold forceps for histology. Estimated blood loss was minimal.    03/30/2022 Pathology Results   Patient: Benning, Whitley P  Accession: [id number]  Diagnosis 1. Surgical [P], duodenal - BENIGN SMALL BOWEL MUCOSA WITH NO SIGNIFICANT PATHOLOGIC CHANGES 2. Surgical [P], gastric antrum - GASTRIC ANTRAL MUCOSA WITH FEATURES OF REACTIVE GASTROPATHY - NEGATIVE FOR H. PYLORI ON H&E STAIN - NEGATIVE FOR INTESTINAL METAPLASIA OR MALIGNANCY 3. Surgical [P], gastric body - GASTRIC OXYNTIC MUCOSA WITH REACTIVE/REPARATIVE CHANGES - NEGATIVE FOR H. PYLORI ON H&E STAIN - NEGATIVE FOR INTESTINAL METAPLASIA, DYSPLASIA OR MALIGNANCY 4. Surgical [P], greater curve ulceration - ADENOCARCINOMA WITH SIGNET RING CELL FEATURES  (SEE NOTE) 5. Surgical [P], gastric polyps - ADENOCARCINOMA WITH SIGNET RING CELL FEATURES (SEE NOTE) 6. Surgical [P], fundus (gastric) - ADENOCARCINOMA WITH SIGNET RING CELL FEATURES (SEE NOTE) 7. Surgical [P], colon, ascending, polyp (1) - TUBULAR ADENOMA. - NO HIGH GRADE DYSPLASIA OR MALIGNANCY. 8. Surgical [P], colon, transverse, polyp (1) - TUBULAR ADENOMA. - NO HIGH GRADE DYSPLASIA OR MALIGNANCY.    04/13/2022 Initial Diagnosis   Gastric cancer (HCC)   04/19/2022 Cancer Staging   Staging form: Stomach, AJCC 8th Edition - Clinical stage from 04/19/2022: Stage IVB (cT2, cN0, pM1) - Signed by [name], MD on 05/08/2022 Total positive nodes: 0   05/05/2022 Genetic Testing   Negative genetic testing on the Multi-cancer gene panel + RNA.  FH c.259C>T VUS identified.  The report date is May 05, 2022.  The Multi-Cancer + RNA Panel offered by [facility] includes sequencing and/or deletion/duplication analysis of the following 70 genes:  AIP*, ALK, APC*, ATM*, AXIN2*,  BAP1*, BARD1*, BLM*, BMPR1A*, BRCA1*, BRCA2*, BRIP1*, CDC73*, CDH1*, CDK4, CDKN1B*, CDKN2A, CHEK2*, CTNNA1*, DICER1*, EPCAM (del/dup only), EGFR, FH*, FLCN*, GREM1 (promoter dup only), HOXB13, KIT, LZTR1, MAX*, MBD4, MEN1*, MET, MITF, MLH1*, MSH2*, MSH3*, MSH6*, MUTYH*, NF1*, NF2*, NTHL1*, PALB2*, PDGFRA, PMS2*, POLD1*, POLE*, POT1*, PRKAR1A*, PTCH1*, PTEN*, RAD51C*, RAD51D*, RB1*, RET, SDHA* (sequencing only), SDHAF2*, SDHB*, SDHC*, SDHD*, SMAD4*, SMARCA4*, SMARCB1*, SMARCE1*, STK11*, SUFU*, TMEM127*, TP53*, TSC1*, TSC2*, VHL*. RNA analysis is performed for * genes.    05/09/2022 - 06/07/2022 Chemotherapy   Patient is on Treatment Plan : GASTROESOPHAGEAL FLOT q14d X 4 cycles      Miscellaneous   Foundation One  Biomarker Findings Microsatellite status- Cannot be determined Tumor Mutational Burden- Cannot be determined  Genomic Findings  FGFR2 amplification,FGFR2-TACC2 fusion,  Rearrangement intron 17 ARAF  amplification CCND3 amplification TP53 [id number]*74     05/30/2022 Imaging    IMPRESSION: 1. Mild hypermetabolism corresponding to a dominant left upper quadrant mass and smaller perigastric nodules or nodes. 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 Recommend attention to the dominant left upper quadrant soft tissue mass on follow-up exams to exclude unlikely recurrent lymphoma. 2. No gastric hypermetabolism and no typical findings of metastatic disease.   06/20/2022 - 11/16/2022 Chemotherapy   Patient is on Treatment Plan : GASTRIC FOLFOX q14d x 12 cycles     08/27/2022 Imaging    IMPRESSION: No focal gastric mass on CT.   No findings suspicious for recurrent or metastatic disease.   Stable left upper abdominal soft tissue lesion and small lymph nodes, chronic, favoring treated lymphoma.   11/27/2022 Imaging    IMPRESSION: 1. Questionable thickening of the distal esophagus/GE junction and gastric antrum, consider further evaluation with endoscopy. 2. Chronically stable left upper quadrant nodularity and prominent lymph nodes again favored treated lymphoma. Continued attention on follow-up imaging suggested. 3. No convincing evidence of metastatic disease in the chest, abdomen or pelvis. 4. Questionable asymmetric wall thickening of the rectum, consider further evaluation with colonoscopy. 5. Mild wall thickening of a nondistended urinary bladder, correlate with urinalysis to exclude cystitis. 6. Hepatic steatosis.   07/10/2023 - 11/27/2023 Chemotherapy   Patient is on Treatment Plan : GASTROESOPHAGEAL Zolbetuximab (800/400) + FOLFOX D1,15,29 q42d x 4 cycles / Zolbetuximab (400) + 5FU + Leucovorin  D1,15,29 q42d     12/17/2023 -  Chemotherapy   Patient is on Treatment Plan : GASTRIC/GE JUNCTION PACLitaxel  D1,8,15 + Ramucirumab  D1,15 q28d         REVIEW OF SYSTEMS:   Constitutional: Denies fevers, chills or abnormal weight loss.  Improved appetite. Having some difficulty swallowig, contributing to 2 pound weight loss since last visit.  Eyes: Denies blurriness of vision Ears, nose, mouth, throat, and face: Denies mucositis or sore throat Respiratory: Denies cough, dyspnea or wheezes Cardiovascular: Denies palpitation, chest discomfort or lower extremity swelling Gastrointestinal:  Denies nausea, heartburn or change in bowel habits. Increased abdominal tenderness. Skin: Denies abnormal skin rashes Lymphatics: Denies new lymphadenopathy or easy bruising Neurological:Denies numbness, tingling or new weaknesses Behavioral/Psych: Mood is stable, no new changes  All other systems were reviewed with the patient and are negative.   VITALS:   Today's Vitals   01/15/24 0902  BP: 126/86  Pulse: 97  Resp: 15  Temp: 97.6 F (36.4 C)  TempSrc: Temporal  SpO2: 100%  Weight: 159 lb 1.6 oz (72.2 kg)  Height: 5' 8 (1.727 m)   Body mass index is 24.19 kg/m.   Wt Readings from Last  3 Encounters:  01/22/24 158 lb (71.7 kg)  01/15/24 159 lb 1.6 oz (72.2 kg)  01/01/24 161 lb 8 oz (73.3 kg)    Body mass index is 24.19 kg/m.  Performance status (ECOG): 1 - Symptomatic but completely ambulatory  PHYSICAL EXAM:   GENERAL:alert, no distress and comfortable SKIN: skin color, texture, turgor are normal, no rashes or significant lesions EYES: normal, Conjunctiva are pink and non-injected, sclera clear OROPHARYNX:no exudate, no erythema and lips, buccal mucosa, and tongue normal  NECK: supple, thyroid  normal size, non-tender, without nodularity LYMPH:  no palpable lymphadenopathy in the cervical, axillary or inguinal LUNGS: clear to auscultation and percussion with normal breathing effort HEART: regular rate & rhythm and no murmurs and no lower extremity edema ABDOMEN:abdomen soft with normal bowel sounds. Mild abdominal tenderness with palpation, mostly ir  right upper quadrant and epigastric areas of the abdomen.   Musculoskeletal:no cyanosis of digits and no clubbing  NEURO: alert & oriented x 3 with fluent speech, no focal motor/sensory deficits  LABORATORY DATA:  I have reviewed the data as listed    Component Value Date/Time   NA 138 01/22/2024 0927   NA 137 12/20/2020 0957   K 3.2 (L) 01/22/2024 0927   CL 103 01/22/2024 0927   CO2 30 01/22/2024 0927   GLUCOSE 117 (H) 01/22/2024 0927   BUN 8 01/22/2024 0927   BUN 11 12/20/2020 0957   CREATININE 0.53 01/22/2024 0927   CALCIUM  9.0 01/22/2024 0927   PROT 6.4 (L) 01/22/2024 0927   PROT 7.3 12/20/2020 0957   ALBUMIN 3.8 01/22/2024 0927   ALBUMIN 4.7 12/20/2020 0957   AST 17 01/22/2024 0927   ALT 9 01/22/2024 0927   ALKPHOS 77 01/22/2024 0927   BILITOT 0.4 01/22/2024 0927   GFRNONAA >60 01/22/2024 0927   GFRAA 106 03/31/2020 1036   Lab Results  Component Value Date   WBC 3.6 (L) 01/22/2024   NEUTROABS 2.1 01/22/2024   HGB 12.6 01/22/2024   HCT 35.9 (L) 01/22/2024   MCV 83.3 01/22/2024   PLT 374 01/22/2024

## 2024-01-15 ENCOUNTER — Encounter: Payer: Self-pay | Admitting: Hematology

## 2024-01-15 ENCOUNTER — Inpatient Hospital Stay

## 2024-01-15 ENCOUNTER — Inpatient Hospital Stay (HOSPITAL_BASED_OUTPATIENT_CLINIC_OR_DEPARTMENT_OTHER): Admitting: Nurse Practitioner

## 2024-01-15 ENCOUNTER — Inpatient Hospital Stay: Admitting: Dietician

## 2024-01-15 VITALS — BP 126/86 | HR 97 | Temp 97.6°F | Resp 15 | Ht 68.0 in | Wt 159.1 lb

## 2024-01-15 DIAGNOSIS — C162 Malignant neoplasm of body of stomach: Secondary | ICD-10-CM

## 2024-01-15 DIAGNOSIS — Z5112 Encounter for antineoplastic immunotherapy: Secondary | ICD-10-CM | POA: Diagnosis not present

## 2024-01-15 LAB — CBC WITH DIFFERENTIAL (CANCER CENTER ONLY)
Abs Immature Granulocytes: 0.01 K/uL (ref 0.00–0.07)
Basophils Absolute: 0 K/uL (ref 0.0–0.1)
Basophils Relative: 2 %
Eosinophils Absolute: 0 K/uL (ref 0.0–0.5)
Eosinophils Relative: 2 %
HCT: 34.8 % — ABNORMAL LOW (ref 36.0–46.0)
Hemoglobin: 12.4 g/dL (ref 12.0–15.0)
Immature Granulocytes: 1 %
Lymphocytes Relative: 42 %
Lymphs Abs: 0.9 K/uL (ref 0.7–4.0)
MCH: 29.8 pg (ref 26.0–34.0)
MCHC: 35.6 g/dL (ref 30.0–36.0)
MCV: 83.7 fL (ref 80.0–100.0)
Monocytes Absolute: 0.4 K/uL (ref 0.1–1.0)
Monocytes Relative: 22 %
Neutro Abs: 0.6 K/uL — ABNORMAL LOW (ref 1.7–7.7)
Neutrophils Relative %: 31 %
Platelet Count: 412 K/uL — ABNORMAL HIGH (ref 150–400)
RBC: 4.16 MIL/uL (ref 3.87–5.11)
RDW: 14.4 % (ref 11.5–15.5)
WBC Count: 2 K/uL — ABNORMAL LOW (ref 4.0–10.5)
nRBC: 0 % (ref 0.0–0.2)

## 2024-01-15 LAB — CMP (CANCER CENTER ONLY)
ALT: 7 U/L (ref 0–44)
AST: 14 U/L — ABNORMAL LOW (ref 15–41)
Albumin: 3.6 g/dL (ref 3.5–5.0)
Alkaline Phosphatase: 79 U/L (ref 38–126)
Anion gap: 6 (ref 5–15)
BUN: 6 mg/dL (ref 6–20)
CO2: 29 mmol/L (ref 22–32)
Calcium: 8.9 mg/dL (ref 8.9–10.3)
Chloride: 104 mmol/L (ref 98–111)
Creatinine: 0.45 mg/dL (ref 0.44–1.00)
GFR, Estimated: >60 mL/min (ref >60)
Glucose, Bld: 103 mg/dL — ABNORMAL HIGH (ref 70–99)
Potassium: 3 mmol/L — ABNORMAL LOW (ref 3.5–5.1)
Sodium: 139 mmol/L (ref 135–145)
Total Bilirubin: 0.4 mg/dL (ref 0.0–1.2)
Total Protein: 6.3 g/dL — ABNORMAL LOW (ref 6.5–8.1)

## 2024-01-15 MED ORDER — SODIUM CHLORIDE 0.9 % IV SOLN
65.0000 mg/m2 | Freq: Once | INTRAVENOUS | Status: AC
  Administered 2024-01-15: 126 mg via INTRAVENOUS
  Filled 2024-01-15: qty 21

## 2024-01-15 MED ORDER — ACETAMINOPHEN 325 MG PO TABS
650.0000 mg | ORAL_TABLET | Freq: Once | ORAL | Status: AC
  Administered 2024-01-15: 650 mg via ORAL
  Filled 2024-01-15: qty 2

## 2024-01-15 MED ORDER — FAMOTIDINE IN NACL 20-0.9 MG/50ML-% IV SOLN
20.0000 mg | Freq: Once | INTRAVENOUS | Status: AC
  Administered 2024-01-15: 20 mg via INTRAVENOUS
  Filled 2024-01-15: qty 50

## 2024-01-15 MED ORDER — SODIUM CHLORIDE 0.9 % IV SOLN
8.0000 mg/kg | Freq: Once | INTRAVENOUS | Status: AC
  Administered 2024-01-15: 600 mg via INTRAVENOUS
  Filled 2024-01-15: qty 10

## 2024-01-15 MED ORDER — POTASSIUM CHLORIDE 10 MEQ/100ML IV SOLN
10.0000 meq | Freq: Once | INTRAVENOUS | Status: AC
  Administered 2024-01-15: 10 meq via INTRAVENOUS
  Filled 2024-01-15: qty 100

## 2024-01-15 MED ORDER — DEXAMETHASONE SODIUM PHOSPHATE 10 MG/ML IJ SOLN
10.0000 mg | Freq: Once | INTRAMUSCULAR | Status: AC
  Administered 2024-01-15: 10 mg via INTRAVENOUS
  Filled 2024-01-15: qty 1

## 2024-01-15 MED ORDER — DIPHENHYDRAMINE HCL 50 MG/ML IJ SOLN
25.0000 mg | Freq: Once | INTRAMUSCULAR | Status: AC
  Administered 2024-01-15: 25 mg via INTRAVENOUS
  Filled 2024-01-15: qty 1

## 2024-01-15 MED ORDER — SODIUM CHLORIDE 0.9 % IV SOLN: Freq: Once | INTRAVENOUS | Status: AC

## 2024-01-15 MED ORDER — SODIUM CHLORIDE 0.9 % IV SOLN: INTRAVENOUS | Status: AC

## 2024-01-15 MED ORDER — SODIUM CHLORIDE 0.9 % IV SOLN: 80.0000 mg/m2 | Freq: Once | INTRAVENOUS | Status: DC

## 2024-01-15 NOTE — Patient Instructions (Signed)
 [facility] - A DEPT OF [facility]  Discharge Instructions: Thank you for choosing [facility] to provide your oncology and hematology care.   If you have a lab appointment with the Cancer Center, please go directly to the Cancer Center and check in at the registration area.   Wear comfortable clothing and clothing appropriate for easy access to any Portacath or PICC line.   We strive to give you quality time with your provider. You may need to reschedule your appointment if you arrive late (15 or more minutes).  Arriving late affects you and other patients whose appointments are after yours.  Also, if you miss three or more appointments without notifying the office, you may be dismissed from the clinic at the provider's discretion.      For prescription refill requests, have your pharmacy contact our office and allow 72 hours for refills to be completed.    Today you received the following chemotherapy and/or immunotherapy agents: Cyramza, Taxol.       To help prevent nausea and vomiting after your treatment, we encourage you to take your nausea medication as directed.  BELOW ARE SYMPTOMS THAT SHOULD BE REPORTED IMMEDIATELY: *FEVER GREATER THAN 100.4 F (38 C) OR HIGHER *CHILLS OR SWEATING *NAUSEA AND VOMITING THAT IS NOT CONTROLLED WITH YOUR NAUSEA MEDICATION *UNUSUAL SHORTNESS OF BREATH *UNUSUAL BRUISING OR BLEEDING *URINARY PROBLEMS (pain or burning when urinating, or frequent urination) *BOWEL PROBLEMS (unusual diarrhea, constipation, pain near the anus) TENDERNESS IN MOUTH AND THROAT WITH OR WITHOUT PRESENCE OF ULCERS (sore throat, sores in mouth, or a toothache) UNUSUAL RASH, SWELLING OR PAIN  UNUSUAL VAGINAL DISCHARGE OR ITCHING   Items with * indicate a potential emergency and should be followed up as soon as possible or go to the Emergency Department if any problems should occur.  Please show the CHEMOTHERAPY ALERT CARD or  IMMUNOTHERAPY ALERT CARD at check-in to the Emergency Department and triage nurse.  Should you have questions after your visit or need to cancel or reschedule your appointment, please contact [facility] - A DEPT OF [facility]  Dept: [phone]  and follow the prompts.  Office hours are 8:00 a.m. to 4:30 p.m. Monday - Friday. Please note that voicemails left after 4:00 p.m. may not be returned until the following business day.  We are closed weekends and major holidays. You have access to a nurse at all times for urgent questions. Please call the main number to the clinic Dept: [phone] and follow the prompts.   For any non-urgent questions, you may also contact your provider using MyChart. We now offer e-Visits for anyone 62 and older to request care online for non-urgent symptoms. For details visit [email or web address].   Also download the MyChart app! Go to the app store, search "MyChart", open the app, select [facility], and log in with your MyChart username and password.

## 2024-01-15 NOTE — Progress Notes (Signed)
 Nutrition Follow-up:  Patient with gastric cancer. She is currently receiving reduced dose Zolbetuximab/5FU + Leucovorin  q42d (start 07/09/23). Regimen discontinued 8/13 due to progression.   Met with patient in infusion. She is eating small bag of doritos. Dad is at bedside. Patient reports nausea/vomiting episodes have significantly improved with current regimen. Appetite is slowly improving. Patient tolerating very small portions. If she over eats, she gets sick. Reports stopping appetite stimulant because it worked too good. She stayed hungry and unable to tolerate more intake. Yesterday had coke slushy and nature [place] bar (160 kcal, 3g) at breakfast. This has been working well to settle stomach in the morning. Had one piece of 6oz salmon split for lunch and dinner. She was planning to have chicken noodle soup, but felt full after dinner. Patient tolerating one sip of glucerna secondary to satiety. Drinking gatorade, slushy, and sips of water.    Medications: reviewed   Labs: K 3.0 (IV repletion today)  Anthropometrics: Wt 159 lb 1.6 oz   9/3 - 161 lb 8 oz 8/27 - 165 lb 9.6 oz 8/19 - 164 lb 4 oz   NUTRITION DIAGNOSIS: Unintended wt loss - continues    INTERVENTION:  Continue working to increase daily oral intake, encourage bites/sips q2h as tolerated  Continue antiemetics per MD Suggested trying half of appetite stimulant  Provided samples of Ensure Clear (juice) - suggested adding to ginger ale or sprite - recommend drinking daily if tolerated    MONITORING, EVALUATION, GOAL: wt trends, intake    NEXT VISIT: Wednesday October 1 during infusion

## 2024-01-15 NOTE — Progress Notes (Addendum)
 Proceed with Cyramza  full dose and reduce Taxol  to 65mg /m2 d/t per Powell Lessen, NP. Orders entered for Zarxio  300mcg x 3 days.  Adelaine Roppolo, PharmD, MBA

## 2024-01-16 ENCOUNTER — Inpatient Hospital Stay

## 2024-01-16 VITALS — BP 135/93 | HR 93 | Temp 99.1°F | Resp 16

## 2024-01-16 DIAGNOSIS — Z95828 Presence of other vascular implants and grafts: Secondary | ICD-10-CM

## 2024-01-16 DIAGNOSIS — Z5112 Encounter for antineoplastic immunotherapy: Secondary | ICD-10-CM | POA: Diagnosis not present

## 2024-01-16 MED ORDER — FILGRASTIM-SNDZ 300 MCG/0.5ML IJ SOSY
300.0000 ug | PREFILLED_SYRINGE | Freq: Once | INTRAMUSCULAR | Status: AC
  Administered 2024-01-16: 300 ug via SUBCUTANEOUS
  Filled 2024-01-16: qty 0.5

## 2024-01-17 ENCOUNTER — Inpatient Hospital Stay

## 2024-01-17 ENCOUNTER — Encounter: Payer: Self-pay | Admitting: Hematology

## 2024-01-17 VITALS — BP 115/97 | HR 100 | Temp 99.4°F | Resp 16

## 2024-01-17 DIAGNOSIS — Z5112 Encounter for antineoplastic immunotherapy: Secondary | ICD-10-CM | POA: Diagnosis not present

## 2024-01-17 DIAGNOSIS — Z95828 Presence of other vascular implants and grafts: Secondary | ICD-10-CM

## 2024-01-17 MED ORDER — FILGRASTIM-SNDZ 300 MCG/0.5ML IJ SOSY
300.0000 ug | PREFILLED_SYRINGE | Freq: Once | INTRAMUSCULAR | Status: AC
  Administered 2024-01-17: 300 ug via SUBCUTANEOUS
  Filled 2024-01-17: qty 0.5

## 2024-01-17 NOTE — Patient Instructions (Signed)
 Filgrastim Injection What is this medication? FILGRASTIM (fil GRA stim) lowers the risk of infection in people who are receiving chemotherapy. It works by Systems analyst make more white blood cells, which protects your body from infection. It may also be used to help people who have been exposed to high doses of radiation. It can be used to help prepare your body before a stem cell transplant. It works by helping your bone marrow make and release stem cells into the blood. This medicine may be used for other purposes; ask your health care provider or pharmacist if you have questions. COMMON BRAND NAME(S): Neupogen, Nivestym, Nypozi, Releuko, Zarxio What should I tell my care team before I take this medication? They need to know if you have any of these conditions: History of blood diseases, such as sickle cell anemia Kidney disease Recent or ongoing radiation An unusual or allergic reaction to filgrastim, pegfilgrastim, latex, rubber, other medications, foods, dyes, or preservatives Pregnant or trying to get pregnant Breast-feeding How should I use this medication? This medication is injected under the skin or into a vein. It is usually given by your care team in a hospital or clinic setting. It may be given at home. If you get this medication at home, you will be taught how to prepare and give it. Use exactly as directed. Take it as directed on the prescription label at the same time every day. Keep taking it unless your care team tells you to stop. It is important that you put your used needles and syringes in a special sharps container. Do not put them in a trash can. If you do not have a sharps container, call your pharmacist or care team to get one. This medication comes with INSTRUCTIONS FOR USE. Ask your pharmacist for directions on how to use this medication. Read the information carefully. Talk to your pharmacist or care team if you have questions. Talk to your care team about the use of  this medication in children. While it may be prescribed for children for selected conditions, precautions do apply. Overdosage: If you think you have taken too much of this medicine contact a poison control center or emergency room at once. NOTE: This medicine is only for you. Do not share this medicine with others. What if I miss a dose? It is important not to miss any doses. Talk to your care team about what to do if you miss a dose. What may interact with this medication? Medications that may cause a release of neutrophils, such as lithium This list may not describe all possible interactions. Give your health care provider a list of all the medicines, herbs, non-prescription drugs, or dietary supplements you use. Also tell them if you smoke, drink alcohol, or use illegal drugs. Some items may interact with your medicine. What should I watch for while using this medication? Your condition will be monitored carefully while you are receiving this medication. You may need bloodwork while taking this medication. Talk to your care team about your risk of cancer. You may be more at risk for certain types of cancer if you take this medication. What side effects may I notice from receiving this medication? Side effects that you should report to your care team as soon as possible: Allergic reactions--skin rash, itching, hives, swelling of the face, lips, tongue, or throat Capillary leak syndrome--stomach or muscle pain, unusual weakness or fatigue, feeling faint or lightheaded, decrease in the amount of urine, swelling of the ankles, hands,  or feet, trouble breathing High white blood cell level--fever, fatigue, trouble breathing, night sweats, change in vision, weight loss Inflammation of the aorta--fever, fatigue, back, chest, or stomach pain, severe headache Kidney injury (glomerulonephritis)--decrease in the amount of urine, red or dark brown urine, foamy or bubbly urine, swelling of the ankles, hands,  or feet Shortness of breath or trouble breathing Spleen injury--pain in upper left stomach or shoulder Unusual bruising or bleeding Side effects that usually do not require medical attention (report to your care team if they continue or are bothersome): Back pain Bone pain Fatigue Fever Headache Nausea This list may not describe all possible side effects. Call your doctor for medical advice about side effects. You may report side effects to FDA at 1-800-FDA-1088. Where should I keep my medication? Keep out of the reach of children and pets. Keep this medication in the original packaging until you are ready to take it. Protect from light. See product for storage information. Each product may have different instructions. Get rid of any unused medication after the expiration date. To get rid of medications that are no longer needed or have expired: Take the medication to a medications take-back program. Check with your pharmacy or law enforcement to find a location. If you cannot return the medication, ask your pharmacist or care team how to get rid of this medication safely. NOTE: This sheet is a summary. It may not cover all possible information. If you have questions about this medicine, talk to your doctor, pharmacist, or health care provider.  2024 Elsevier/Gold Standard (2021-09-07 00:00:00)

## 2024-01-18 ENCOUNTER — Inpatient Hospital Stay

## 2024-01-18 VITALS — BP 126/93 | HR 98 | Temp 97.7°F | Resp 17

## 2024-01-18 DIAGNOSIS — Z5112 Encounter for antineoplastic immunotherapy: Secondary | ICD-10-CM | POA: Diagnosis not present

## 2024-01-18 DIAGNOSIS — Z95828 Presence of other vascular implants and grafts: Secondary | ICD-10-CM

## 2024-01-18 MED ORDER — FILGRASTIM-SNDZ 300 MCG/0.5ML IJ SOSY
300.0000 ug | PREFILLED_SYRINGE | Freq: Once | INTRAMUSCULAR | Status: AC
  Administered 2024-01-18: 300 ug via SUBCUTANEOUS
  Filled 2024-01-18: qty 0.5

## 2024-01-21 NOTE — Assessment & Plan Note (Signed)
 [id number] with peritoneal metastasis. 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   -she started first line chemo FLOT on 05/09/22 -She understands that chemotherapy is palliative, to prolong her life.  We are unlikely going to cure her cancer. -PD-L1 0-1%, very limited benefit from PD-L1 immunotherapy, FO revealed FGFR2 amplification and fusion (+), FGFR inhibitors can be considered in future, no other targeted therapy available  -I changed her chemo from FLOT to FOLFOX on 06/20/2022 -She is not able to return to work due to the cancer and treatment related symptoms.  -I subsequently changed her treatment to maintenance Xeloda  in early August 2024, she is tolerating well overall  -her NGS Caris showed positive Claudin 18.2, she is a candidate for zolbetuximab.  -Repeated EGD on March 21, 2023 showed residual gastric cancer.  We discussed option of changing her chemotherapy back to FOLFOX and add zolbetuximab.  -PET 06/03/2023 showed stable disease (no hypermetabolic disease outside stomach) -Patient developed recurrent abdominal pain, similar to the symptoms she had when she was diagnosed.  She agreed to change treatment back to FOLFOX, and add Zolbetuximab. She started on 07/09/2023. She tolerated first cycle poorly and had prolonged recovery. Oxaliplatin  was stop after cycle 1 due to poor tolerance. She continued 5-fu/LV and zolbe every 2 weeks  -CT 10/10/2023 showed stable disease - Unfortunately PET scan on December 06, 2023 showed worsening peritoneal metastasis.  I recommend change treatment to paclitaxel  and ramucirumab , she started on 12/17/23

## 2024-01-22 ENCOUNTER — Inpatient Hospital Stay

## 2024-01-22 ENCOUNTER — Inpatient Hospital Stay (HOSPITAL_BASED_OUTPATIENT_CLINIC_OR_DEPARTMENT_OTHER): Admitting: Hematology

## 2024-01-22 VITALS — BP 128/78 | HR 95 | Temp 97.3°F | Resp 17 | Ht 68.0 in | Wt 158.0 lb

## 2024-01-22 DIAGNOSIS — C162 Malignant neoplasm of body of stomach: Secondary | ICD-10-CM

## 2024-01-22 DIAGNOSIS — Z5112 Encounter for antineoplastic immunotherapy: Secondary | ICD-10-CM | POA: Diagnosis not present

## 2024-01-22 LAB — CMP (CANCER CENTER ONLY)
ALT: 9 U/L (ref 0–44)
AST: 17 U/L (ref 15–41)
Albumin: 3.8 g/dL (ref 3.5–5.0)
Alkaline Phosphatase: 77 U/L (ref 38–126)
Anion gap: 5 (ref 5–15)
BUN: 8 mg/dL (ref 6–20)
CO2: 30 mmol/L (ref 22–32)
Calcium: 9 mg/dL (ref 8.9–10.3)
Chloride: 103 mmol/L (ref 98–111)
Creatinine: 0.53 mg/dL (ref 0.44–1.00)
GFR, Estimated: >60 mL/min (ref >60)
Glucose, Bld: 117 mg/dL — ABNORMAL HIGH (ref 70–99)
Potassium: 3.2 mmol/L — ABNORMAL LOW (ref 3.5–5.1)
Sodium: 138 mmol/L (ref 135–145)
Total Bilirubin: 0.4 mg/dL (ref 0.0–1.2)
Total Protein: 6.4 g/dL — ABNORMAL LOW (ref 6.5–8.1)

## 2024-01-22 LAB — CBC WITH DIFFERENTIAL (CANCER CENTER ONLY)
Abs Immature Granulocytes: 0.05 K/uL (ref 0.00–0.07)
Basophils Absolute: 0 K/uL (ref 0.0–0.1)
Basophils Relative: 1 %
Eosinophils Absolute: 0.1 K/uL (ref 0.0–0.5)
Eosinophils Relative: 1 %
HCT: 35.9 % — ABNORMAL LOW (ref 36.0–46.0)
Hemoglobin: 12.6 g/dL (ref 12.0–15.0)
Immature Granulocytes: 1 %
Lymphocytes Relative: 21 %
Lymphs Abs: 0.8 K/uL (ref 0.7–4.0)
MCH: 29.2 pg (ref 26.0–34.0)
MCHC: 35.1 g/dL (ref 30.0–36.0)
MCV: 83.3 fL (ref 80.0–100.0)
Monocytes Absolute: 0.6 K/uL (ref 0.1–1.0)
Monocytes Relative: 17 %
Neutro Abs: 2.1 K/uL (ref 1.7–7.7)
Neutrophils Relative %: 59 %
Platelet Count: 374 K/uL (ref 150–400)
RBC: 4.31 MIL/uL (ref 3.87–5.11)
RDW: 14.2 % (ref 11.5–15.5)
WBC Count: 3.6 K/uL — ABNORMAL LOW (ref 4.0–10.5)
nRBC: 0 % (ref 0.0–0.2)

## 2024-01-22 MED ORDER — FAMOTIDINE IN NACL 20-0.9 MG/50ML-% IV SOLN
20.0000 mg | Freq: Once | INTRAVENOUS | Status: AC
  Administered 2024-01-22: 20 mg via INTRAVENOUS
  Filled 2024-01-22: qty 50

## 2024-01-22 MED ORDER — SODIUM CHLORIDE 0.9 % IV SOLN
60.0000 mg/m2 | Freq: Once | INTRAVENOUS | Status: AC
  Administered 2024-01-22: 114 mg via INTRAVENOUS
  Filled 2024-01-22: qty 19

## 2024-01-22 MED ORDER — SODIUM CHLORIDE 0.9 % IV SOLN: INTRAVENOUS | Status: AC

## 2024-01-22 MED ORDER — DIPHENHYDRAMINE HCL 50 MG/ML IJ SOLN
25.0000 mg | Freq: Once | INTRAMUSCULAR | Status: AC
  Administered 2024-01-22: 25 mg via INTRAVENOUS
  Filled 2024-01-22: qty 1

## 2024-01-22 MED ORDER — DEXAMETHASONE SODIUM PHOSPHATE 10 MG/ML IJ SOLN
10.0000 mg | Freq: Once | INTRAMUSCULAR | Status: AC
  Administered 2024-01-22: 10 mg via INTRAVENOUS
  Filled 2024-01-22: qty 1

## 2024-01-22 NOTE — Progress Notes (Signed)
 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   -she started first line chemo FLOT on 05/09/22 -She understands that chemotherapy is palliative, to prolong her life.  We are unlikely going to cure her cancer. -PD-L1 0-1%, very limited benefit from PD-L1 immunotherapy, FO revealed FGFR2 amplification and fusion (+), FGFR inhibitors can be considered in future, no other targeted therapy available  -I changed her chemo from FLOT to FOLFOX on 06/20/2022 -She is not able to return to work due to the cancer and treatment related symptoms.  -I subsequently changed her treatment to maintenance Xeloda  in early August 2024, she is tolerating well overall  -her NGS Caris showed positive Claudin 18.2, she is a candidate for zolbetuximab.  -Repeated EGD on March 21, 2023 showed residual gastric cancer.  We discussed option of changing her chemotherapy back to FOLFOX and add zolbetuximab.  -PET 06/03/2023 showed stable disease (no hypermetabolic disease outside stomach) -Patient developed recurrent abdominal pain, similar to the symptoms she had when she was diagnosed.  She agreed to change treatment back to FOLFOX, and add Zolbetuximab. She started on 07/09/2023. She tolerated first cycle poorly and had prolonged recovery. Oxaliplatin  was stop after cycle 1 due to poor tolerance. She continued 5-fu/LV and zolbe  every 2 weeks  -CT 10/10/2023 showed stable disease - Unfortunately PET scan on December 06, 2023 showed worsening peritoneal metastasis.  I recommend change treatment to paclitaxel  and ramucirumab , she started on 12/17/23  Assessment & Plan Metastatic gastric cancer Undergoing treatment with improved tolerability in cycle two, second week of chemotherapy. Reports reduced nausea and no vomiting. Blood counts improved with normal ANC and hemoglobin. Taking potassium supplements for previously low levels. Weight decreased by one pound. - Continue current chemotherapy regimen - Monitor blood counts and kidney/liver function - Encourage nutritional supplements to maintain weight - Schedule follow-up scan after four more treatments  Chemotherapy-induced peripheral neuropathy, right foot predominant Worsening neuropathy in the right foot with numbness and difficulty lifting the foot, likely related to paclitaxel . Uses ice during chemotherapy and advised to wear shoes at home to prevent falls. Uses a cane and a device to improve circulation. - Continue using ice during chemotherapy to reduce neuropathy - Wear supportive shoes at home to prevent falls - Use a cane for support - Consider over-the-counter B12 or B complex supplements - Encourage exercise, such as using a stationary bike, to improve circulation - Monitor neuropathy symptoms and consider reducing chemotherapy dose if symptoms worsen  Chronic cancer-related pain Experiences daily pain, particularly when eating, managed with hydrocodone  at night. Tolerates pain during the day without medication. Pain likely related to cancer and treatment. - Continue hydrocodone  at night for pain management - Consider using Tylenol  before meals to alleviate eating-related pain  Plan - Lab reviewed, adequate for treatment, will reduce paclitaxel  dose to 60 mg/m due to her neuropathy and previous cytopenias - Follow-up in 1 week   SUMMARY OF ONCOLOGIC  HISTORY: Oncology History  Overview Note   Cancer Staging  Gastric cancer [facility]) Staging form: Stomach, AJCC 8th Edition - Clinical stage from 04/19/2022: Stage IVB (cT2, cN0, pM1) - Signed by [name], MD on 05/08/2022 Total positive nodes: 0     Gastric cancer (HCC)  03/30/2022 Procedure   EGD:  Impression:  - Normal esophagus. - A few gastric polyps. Biopsied. - Gastritis. Biopsied. - Non-bleeding gastric ulcer with no stigmata of bleeding. Biopsied. - Normal examined duodenum. Biopsied.  Findings: Diffuse moderate inflammation characterized by congestion (edema), friability and granularity was found in the cardia, in the gastric fundus and in the gastric body. There were associated erosions in multiple places. Biopsies were taken from the antrum, body, and fundus with a cold forceps for histology. Estimated blood loss was minimal.  One non-bleeding cratered gastric ulcer with no stigmata of bleeding was found on the greater curvature of the stomach. The lesion was 6 mm in largest dimension. The mucosa around the ulcer was heaped and led to some deformity in the antrum. Biopsies were taken with a cold forceps for histology. Estimated blood loss was minimal.    03/30/2022 Pathology Results   [name], Sheryl [name]  Accession: [id number]  Diagnosis 1. Surgical [[name]], duodenal - BENIGN SMALL BOWEL MUCOSA WITH NO SIGNIFICANT PATHOLOGIC CHANGES 2. Surgical [[name]], gastric antrum - GASTRIC ANTRAL MUCOSA WITH FEATURES OF REACTIVE GASTROPATHY - NEGATIVE FOR H. PYLORI ON H&E STAIN - NEGATIVE FOR INTESTINAL METAPLASIA OR MALIGNANCY 3. Surgical [[name]], gastric body - GASTRIC OXYNTIC MUCOSA WITH REACTIVE/REPARATIVE CHANGES - NEGATIVE FOR H. PYLORI ON H&E STAIN - NEGATIVE FOR INTESTINAL METAPLASIA, DYSPLASIA OR MALIGNANCY 4. Surgical [[name]], greater curve ulceration - ADENOCARCINOMA WITH SIGNET RING CELL FEATURES (SEE NOTE) 5. Surgical [[name]], gastric polyps - ADENOCARCINOMA WITH SIGNET RING  CELL FEATURES (SEE NOTE) 6. Surgical [[name]], fundus (gastric) - ADENOCARCINOMA WITH SIGNET RING CELL FEATURES (SEE NOTE) 7. Surgical [[name]], colon, ascending, polyp (1) - TUBULAR ADENOMA. - NO HIGH GRADE DYSPLASIA OR MALIGNANCY. 8. Surgical [[name]], colon, transverse, polyp (1) - TUBULAR ADENOMA. - NO HIGH GRADE DYSPLASIA OR MALIGNANCY.    04/13/2022 Initial Diagnosis   Gastric cancer (HCC)   04/19/2022 Cancer Staging   Staging form: Stomach, AJCC 8th Edition - Clinical stage from 04/19/2022: Stage IVB (cT2, cN0, pM1) - Signed by [name], MD on 05/08/2022 Total positive nodes: 0   05/05/2022 Genetic Testing   Negative genetic testing on the Multi-cancer gene panel + RNA.  FH c.259C>T VUS identified.  The report date is May 05, 2022.  The Multi-Cancer + RNA Panel offered by [facility] includes sequencing and/or deletion/duplication analysis of the following 70 genes:  AIP*, ALK, APC*, ATM*, AXIN2*, BAP1*, BARD1*, BLM*, BMPR1A*, BRCA1*, BRCA2*, BRIP1*, CDC73*, CDH1*, CDK4, CDKN1B*, CDKN2A, CHEK2*, CTNNA1*, DICER1*, EPCAM (del/dup only), EGFR, FH*, FLCN*, GREM1 (promoter dup only), HOXB13, KIT, LZTR1, MAX*, MBD4, MEN1*, MET, MITF, MLH1*, MSH2*, MSH3*, MSH6*, MUTYH*, NF1*, NF2*, NTHL1*, PALB2*, PDGFRA, PMS2*, POLD1*, POLE*, POT1*, PRKAR1A*, PTCH1*, PTEN*, RAD51C*, RAD51D*, RB1*, RET, SDHA* (sequencing only), SDHAF2*, SDHB*, SDHC*, SDHD*, SMAD4*, SMARCA4*, SMARCB1*, SMARCE1*, STK11*, SUFU*, TMEM127*, TP53*, TSC1*, TSC2*, VHL*. RNA analysis is performed for * genes.    05/09/2022 - 06/07/2022 Chemotherapy   Patient is on Treatment Plan : GASTROESOPHAGEAL FLOT q14d X 4 cycles      Miscellaneous   Foundation One  Biomarker Findings Microsatellite status- Cannot be determined Tumor Mutational Burden- Cannot be determined  Genomic Findings  FGFR2 amplification,FGFR2-TACC2 fusion,  Rearrangement intron 17 ARAF amplification CCND3 amplification TP53 V225fs*74  05/30/2022 Imaging     IMPRESSION: 1. Mild hypermetabolism corresponding to a dominant left upper quadrant mass and smaller perigastric nodules or nodes. 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 Recommend attention to the dominant left upper quadrant soft tissue mass on follow-up exams to exclude unlikely recurrent lymphoma. 2. No gastric hypermetabolism and no typical findings of metastatic disease.   06/20/2022 - 11/16/2022 Chemotherapy   Patient is on Treatment Plan : GASTRIC FOLFOX q14d x 12 cycles     08/27/2022 Imaging    IMPRESSION: No focal gastric mass on CT.   No findings suspicious for recurrent or metastatic disease.   Stable left upper abdominal soft tissue lesion and small lymph nodes, chronic, favoring treated lymphoma.   11/27/2022 Imaging    IMPRESSION: 1. Questionable thickening of the distal esophagus/GE junction and gastric antrum, consider further evaluation with endoscopy. 2. Chronically stable left upper quadrant nodularity and prominent lymph nodes again favored treated lymphoma. Continued attention on follow-up imaging suggested. 3. No convincing evidence of metastatic disease in the chest, abdomen or pelvis. 4. Questionable asymmetric wall thickening of the rectum, consider further evaluation with colonoscopy. 5. Mild wall thickening of a nondistended urinary bladder, correlate with urinalysis to exclude cystitis. 6. Hepatic steatosis.   07/10/2023 - 11/27/2023 Chemotherapy   Patient is on Treatment Plan : GASTROESOPHAGEAL Zolbetuximab (800/400) + FOLFOX D1,15,29 q42d x 4 cycles / Zolbetuximab (400) + 5FU + Leucovorin  D1,15,29 q42d     12/17/2023 -  Chemotherapy   Patient is on Treatment Plan : GASTRIC/GE JUNCTION PACLitaxel  D1,8,15 + Ramucirumab  D1,15 q28d        Discussed the use of AI scribe software for clinical note transcription with the patient, who gave verbal consent to proceed.  History of Present Illness Sheryl [name]  is a 58 year old female with metastatic gastric cancer who presents for follow-up.  She finds the current treatment regimen for metastatic gastric cancer more tolerable than the previous one. Nausea has improved, and vomiting has resolved. She experiences daily pain, particularly when eating, described as 'hunger pain' that subsides after a while. She manages this pain with hydrocodone  at night and tolerates daytime pain without medication.  Recent blood work shows normal white blood cell count and hemoglobin levels. She takes potassium supplements twice daily. She has lost one pound but maintains adequate food intake with nutritional supplements and has consulted a dietitian.  Neuropathy in her right foot has worsened over the past week and a half, affecting mobility and causing near falls due to numbness. She uses a cane for support and experiences numbness and tingling in her foot, while her hands and fingers remain functional. She uses ice during chemotherapy to manage symptoms.  Her social activity is limited, and she uses a stationary bike to maintain circulation.     All other systems were reviewed with the patient and are negative.  MEDICAL HISTORY:  Past Medical History:  Diagnosis Date   Blood transfusion without reported diagnosis    had transfusion with hysterectomy   Cataract    Colon polyps 2012   Diabetes (HCC) 03/13/2021   Diabetes (HCC) 05/21/2019   Family history of breast cancer    Family history of pancreatic cancer    Family history of stomach cancer    Fibroid    gastric ca 03/2022   GERD (gastroesophageal reflux disease)    H/O blood clots    History of hysterectomy    fibroids and heavy  cycles   Hypertension     SURGICAL HISTORY: Past Surgical History:  Procedure Laterality Date   ABDOMINAL HYSTERECTOMY     BIOPSY  04/19/2022   Procedure: BIOPSY;  Surgeon: Wilhelmenia Aloha Raddle., MD;  Location: [facility];  Service: Gastroenterology;;   BIOPSY   03/21/2023   Procedure: BIOPSY;  Surgeon: Wilhelmenia Aloha Raddle., MD;  Location: [facility];  Service: Gastroenterology;;   COLONOSCOPY     ESOPHAGOGASTRODUODENOSCOPY (EGD) WITH PROPOFOL  N/A 04/19/2022   Procedure: ESOPHAGOGASTRODUODENOSCOPY (EGD) WITH PROPOFOL ;  Surgeon: Wilhelmenia Aloha Raddle., MD;  Location: [facility];  Service: Gastroenterology;  Laterality: N/A;   ESOPHAGOGASTRODUODENOSCOPY (EGD) WITH PROPOFOL  N/A 03/21/2023   Procedure: ESOPHAGOGASTRODUODENOSCOPY (EGD) WITH PROPOFOL ;  Surgeon: Wilhelmenia Aloha Raddle., MD;  Location: [facility];  Service: Gastroenterology;  Laterality: N/A;   EUS N/A 04/19/2022   Procedure: UPPER ENDOSCOPIC ULTRASOUND (EUS) RADIAL;  Surgeon: Wilhelmenia Aloha Raddle., MD;  Location: [facility];  Service: Gastroenterology;  Laterality: N/A;   EXCISION OF SKIN TAG  05/03/2022   Procedure: EXCISION OF CHEST WALL SKIN LESION;  Surgeon: Dasie Leonor CROME, MD;  Location: [facility];  Service: General;;   LAPAROSCOPY N/A 05/03/2022   Procedure: LAPAROSCOPY DIAGNOSTIC WITH PERITONEAL WASHINGS;  Surgeon: Dasie Leonor CROME, MD;  Location: [facility];  Service: General;  Laterality: N/A;   POLYPECTOMY  04/19/2022   Procedure: POLYPECTOMY;  Surgeon: Wilhelmenia Aloha Raddle., MD;  Location: [facility];  Service: Gastroenterology;;   PORTACATH PLACEMENT N/A 05/03/2022   Procedure: INSERTION PORT-A-CATH WITH ULTRASOUND GUIDANCE;  Surgeon: Dasie Leonor CROME, MD;  Location: [facility];  Service: General;  Laterality: N/A;   UPPER GASTROINTESTINAL ENDOSCOPY      I have reviewed the social history and family history with the patient and they are unchanged from previous note.  ALLERGIES:  is allergic to aspirin, cyclobenzaprine, naproxen sodium, zithromax [azithromycin dihydrate], oxaliplatin , and dilaudid [hydromorphone].  MEDICATIONS:  Current Outpatient Medications  Medication Sig Dispense Refill   acetaminophen  (TYLENOL ) 500 MG tablet Take 2 tablets (1,000 mg total) by mouth every 8  (eight) hours as needed (pain). 30 tablet 1   amLODipine  (NORVASC ) 10 MG tablet Take 1 tablet (10 mg total) by mouth daily. 30 tablet 2   b complex vitamins capsule Take 1 capsule by mouth daily.     calcium -vitamin D (OSCAL WITH D) 500-5 MG-MCG tablet Take 2 tablets by mouth 2 (two) times daily.     famotidine  (PEPCID ) 20 MG tablet Take 1 tablet (20 mg total) by mouth 2 (two) times daily. 60 tablet 2   HYDROcodone -acetaminophen  (NORCO/VICODIN) 5-325 MG tablet Take 1 tablet by mouth every 12 (twelve) hours as needed for moderate pain (pain score 4-6). 30 tablet 0   lidocaine -prilocaine  (EMLA ) cream Apply 1 Application topically as needed. 30 g 1   metoCLOPramide  (REGLAN ) 10 MG tablet Take 1 tablet (10 mg total) by mouth every 8 (eight) hours as needed for nausea. 60 tablet 1   pantoprazole  (PROTONIX ) 40 MG tablet Take 1 tablet (40 mg total) by mouth daily. 30 tablet 2   potassium chloride  (KLOR-CON  M) 10 MEQ tablet Take 1 tablet (10 mEq total) by mouth 2 (two) times daily. 60 tablet 1   prochlorperazine  (COMPAZINE ) 10 MG tablet Take 1 tablet (10 mg total) by mouth every 6 (six) hours as needed for nausea or vomiting. 30 tablet 2   promethazine  (PHENERGAN ) 50 MG tablet Take 1 tablet (50 mg total) by mouth 2 (two) times daily as needed for nausea or vomiting. 45 tablet 1  scopolamine  (TRANSDERM-SCOP) 1 MG/3DAYS Place 1 patch (1.5 mg total) onto the skin every 3 (three) days. 10 patch 1   sucralfate  (CARAFATE ) 1 g tablet Take 1 tablet (1 g total) by mouth 2 (two) times daily. 60 tablet 6   zinc gluconate 50 MG tablet Take 50 mg by mouth daily.     No current facility-administered medications for this visit.    PHYSICAL EXAMINATION: ECOG PERFORMANCE STATUS: 2 - Symptomatic, <50% confined to bed  Vitals:   01/22/24 0945  BP: 128/78  Pulse: 95  Resp: 17  Temp: (!) 97.3 F (36.3 C)  SpO2: 99%   Wt Readings from Last 3 Encounters:  01/22/24 158 lb (71.7 kg)  01/15/24 159 lb 1.6 oz (72.2 kg)   01/01/24 161 lb 8 oz (73.3 kg)     GENERAL:alert, no distress and comfortable SKIN: skin color, texture, turgor are normal, no rashes or significant lesions EYES: normal, Conjunctiva are pink and non-injected, sclera clear NECK: supple, thyroid  normal size, non-tender, without nodularity LYMPH:  no palpable lymphadenopathy in the cervical, axillary  LUNGS: clear to auscultation and percussion with normal breathing effort HEART: regular rate & rhythm and no murmurs and no lower extremity edema ABDOMEN:abdomen soft, non-tender and normal bowel sounds Musculoskeletal:no cyanosis of digits and no clubbing  NEURO: alert & oriented x 3 with fluent speech, mild weakness in right LE   Physical Exam   LABORATORY DATA:  I have reviewed the data as listed    Latest Ref Rng & Units 01/22/2024    9:27 AM 01/15/2024    8:37 AM 01/01/2024   10:42 AM  CBC  WBC 4.0 - 10.5 K/uL 3.6  2.0  1.7   Hemoglobin 12.0 - 15.0 g/dL 87.3  87.5  87.4   Hematocrit 36.0 - 46.0 % 35.9  34.8  35.0   Platelets 150 - 400 K/uL 374  412  340         Latest Ref Rng & Units 01/22/2024    9:27 AM 01/15/2024    8:37 AM 01/01/2024   10:42 AM  CMP  Glucose 70 - 99 mg/dL 882  896  850   BUN 6 - 20 mg/dL 8  6  7    Creatinine 0.44 - 1.00 mg/dL 9.46  9.54  9.58   Sodium 135 - 145 mmol/L 138  139  138   Potassium 3.5 - 5.1 mmol/L 3.2  3.0  3.7   Chloride 98 - 111 mmol/L 103  104  104   CO2 22 - 32 mmol/L 30  29  28    Calcium  8.9 - 10.3 mg/dL 9.0  8.9  9.0   Total Protein 6.5 - 8.1 g/dL 6.4  6.3  6.3   Total Bilirubin 0.0 - 1.2 mg/dL 0.4  0.4  0.5   Alkaline Phos 38 - 126 U/L 77  79  70   AST 15 - 41 U/L 17  14  18    ALT 0 - 44 U/L 9  7  12        RADIOGRAPHIC STUDIES: I have personally reviewed the radiological images as listed and agreed with the findings in the report. No results found.    No orders of the defined types were placed in this encounter.  All questions were answered. The patient knows to call the  clinic with any problems, questions or concerns. No barriers to learning was detected. The total time spent in the appointment was 30 minutes, including review of chart and various  tests results, discussions about plan of care and coordination of care plan     Onita Mattock, MD 01/22/2024

## 2024-01-22 NOTE — Progress Notes (Signed)
 Reduce taxol  dose to 60mg /m2 for today's tx and all subsequent cycles d/t neuropathies and cytopenias per Dr. Lanny.  Jaala Bohle, PharmD, MBA

## 2024-01-22 NOTE — Patient Instructions (Signed)
 [facility] - A DEPT OF [facility]  Discharge Instructions: Thank you for choosing [facility] to provide your oncology and hematology care.   If you have a lab appointment with the Cancer Center, please go directly to the Cancer Center and check in at the registration area.   Wear comfortable clothing and clothing appropriate for easy access to any Portacath or PICC line.   We strive to give you quality time with your provider. You may need to reschedule your appointment if you arrive late (15 or more minutes).  Arriving late affects you and other patients whose appointments are after yours.  Also, if you miss three or more appointments without notifying the office, you may be dismissed from the clinic at the provider's discretion.      For prescription refill requests, have your pharmacy contact our office and allow 72 hours for refills to be completed.    Today you received the following chemotherapy and/or immunotherapy agents: PACLitaxel (TAXOL)    To help prevent nausea and vomiting after your treatment, we encourage you to take your nausea medication as directed.  BELOW ARE SYMPTOMS THAT SHOULD BE REPORTED IMMEDIATELY: *FEVER GREATER THAN 100.4 F (38 C) OR HIGHER *CHILLS OR SWEATING *NAUSEA AND VOMITING THAT IS NOT CONTROLLED WITH YOUR NAUSEA MEDICATION *UNUSUAL SHORTNESS OF BREATH *UNUSUAL BRUISING OR BLEEDING *URINARY PROBLEMS (pain or burning when urinating, or frequent urination) *BOWEL PROBLEMS (unusual diarrhea, constipation, pain near the anus) TENDERNESS IN MOUTH AND THROAT WITH OR WITHOUT PRESENCE OF ULCERS (sore throat, sores in mouth, or a toothache) UNUSUAL RASH, SWELLING OR PAIN  UNUSUAL VAGINAL DISCHARGE OR ITCHING   Items with * indicate a potential emergency and should be followed up as soon as possible or go to the Emergency Department if any problems should occur.  Please show the CHEMOTHERAPY ALERT CARD or  IMMUNOTHERAPY ALERT CARD at check-in to the Emergency Department and triage nurse.  Should you have questions after your visit or need to cancel or reschedule your appointment, please contact [facility]  Dept: [phone]  and follow the prompts.  Office hours are 8:00 a.m. to 4:30 p.m. Monday - Friday. Please note that voicemails left after 4:00 p.m. may not be returned until the following business day.  We are closed weekends and major holidays. You have access to a nurse at all times for urgent questions. Please call the main number to the clinic Dept: [phone] and follow the prompts.   For any non-urgent questions, you may also contact your provider using MyChart. We now offer e-Visits for anyone 5 and older to request care online for non-urgent symptoms. For details visit [email or web address].   Also download the MyChart app! Go to the app store, search "MyChart", open the app, select [facility], and log in with your MyChart username and password.

## 2024-01-26 ENCOUNTER — Encounter: Payer: Self-pay | Admitting: Hematology

## 2024-01-26 ENCOUNTER — Encounter: Payer: Self-pay | Admitting: Nurse Practitioner

## 2024-01-27 ENCOUNTER — Other Ambulatory Visit: Payer: Self-pay | Admitting: Hematology

## 2024-01-27 DIAGNOSIS — R1013 Epigastric pain: Secondary | ICD-10-CM

## 2024-01-27 DIAGNOSIS — C162 Malignant neoplasm of body of stomach: Secondary | ICD-10-CM

## 2024-01-28 ENCOUNTER — Other Ambulatory Visit: Payer: Self-pay

## 2024-01-28 NOTE — Assessment & Plan Note (Signed)
 [id number] with peritoneal metastasis. 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   -she started first line chemo FLOT on 05/09/22 -She understands that chemotherapy is palliative, to prolong her life.  We are unlikely going to cure her cancer. -PD-L1 0-1%, very limited benefit from PD-L1 immunotherapy, FO revealed FGFR2 amplification and fusion (+), FGFR inhibitors can be considered in future, no other targeted therapy available  -I changed her chemo from FLOT to FOLFOX on 06/20/2022 -She is not able to return to work due to the cancer and treatment related symptoms.  -I subsequently changed her treatment to maintenance Xeloda  in early August 2024, she is tolerating well overall  -her NGS Caris showed positive Claudin 18.2, she is a candidate for zolbetuximab.  -Repeated EGD on March 21, 2023 showed residual gastric cancer.  We discussed option of changing her chemotherapy back to FOLFOX and add zolbetuximab.  -PET 06/03/2023 showed stable disease (no hypermetabolic disease outside stomach) -Patient developed recurrent abdominal pain, similar to the symptoms she had when she was diagnosed.  She agreed to change treatment back to FOLFOX, and add Zolbetuximab. She started on 07/09/2023. She tolerated first cycle poorly and had prolonged recovery. Oxaliplatin  was stop after cycle 1 due to poor tolerance. She continued 5-fu/LV and zolbe every 2 weeks  -CT 10/10/2023 showed stable disease - Unfortunately PET scan on December 06, 2023 showed worsening peritoneal metastasis.  I recommend change treatment to paclitaxel  and ramucirumab , she started on 12/17/23

## 2024-01-29 ENCOUNTER — Inpatient Hospital Stay

## 2024-01-29 ENCOUNTER — Inpatient Hospital Stay: Admitting: Dietician

## 2024-01-29 ENCOUNTER — Inpatient Hospital Stay: Attending: Physician Assistant

## 2024-01-29 ENCOUNTER — Other Ambulatory Visit: Payer: Self-pay

## 2024-01-29 ENCOUNTER — Other Ambulatory Visit (HOSPITAL_COMMUNITY): Payer: Self-pay

## 2024-01-29 ENCOUNTER — Inpatient Hospital Stay (HOSPITAL_BASED_OUTPATIENT_CLINIC_OR_DEPARTMENT_OTHER): Admitting: Hematology

## 2024-01-29 VITALS — BP 114/84 | HR 103 | Temp 97.3°F | Resp 15 | Ht 68.0 in | Wt 158.3 lb

## 2024-01-29 VITALS — HR 99

## 2024-01-29 DIAGNOSIS — C162 Malignant neoplasm of body of stomach: Secondary | ICD-10-CM

## 2024-01-29 DIAGNOSIS — R2681 Unsteadiness on feet: Secondary | ICD-10-CM

## 2024-01-29 DIAGNOSIS — R1013 Epigastric pain: Secondary | ICD-10-CM

## 2024-01-29 DIAGNOSIS — Z79633 Long term (current) use of mitotic inhibitor: Secondary | ICD-10-CM | POA: Insufficient documentation

## 2024-01-29 DIAGNOSIS — D701 Agranulocytosis secondary to cancer chemotherapy: Secondary | ICD-10-CM | POA: Insufficient documentation

## 2024-01-29 DIAGNOSIS — C169 Malignant neoplasm of stomach, unspecified: Secondary | ICD-10-CM | POA: Insufficient documentation

## 2024-01-29 DIAGNOSIS — C786 Secondary malignant neoplasm of retroperitoneum and peritoneum: Secondary | ICD-10-CM | POA: Diagnosis not present

## 2024-01-29 DIAGNOSIS — Z5112 Encounter for antineoplastic immunotherapy: Secondary | ICD-10-CM | POA: Diagnosis present

## 2024-01-29 DIAGNOSIS — T451X5A Adverse effect of antineoplastic and immunosuppressive drugs, initial encounter: Secondary | ICD-10-CM | POA: Diagnosis not present

## 2024-01-29 DIAGNOSIS — Z5111 Encounter for antineoplastic chemotherapy: Secondary | ICD-10-CM | POA: Insufficient documentation

## 2024-01-29 DIAGNOSIS — C801 Malignant (primary) neoplasm, unspecified: Secondary | ICD-10-CM

## 2024-01-29 DIAGNOSIS — G893 Neoplasm related pain (acute) (chronic): Secondary | ICD-10-CM | POA: Insufficient documentation

## 2024-01-29 LAB — CBC WITH DIFFERENTIAL (CANCER CENTER ONLY)
Abs Immature Granulocytes: 0.01 K/uL (ref 0.00–0.07)
Basophils Absolute: 0 K/uL (ref 0.0–0.1)
Basophils Relative: 2 %
Eosinophils Absolute: 0 K/uL (ref 0.0–0.5)
Eosinophils Relative: 2 %
HCT: 32.7 % — ABNORMAL LOW (ref 36.0–46.0)
Hemoglobin: 11.5 g/dL — ABNORMAL LOW (ref 12.0–15.0)
Immature Granulocytes: 1 %
Lymphocytes Relative: 26 %
Lymphs Abs: 0.5 K/uL — ABNORMAL LOW (ref 0.7–4.0)
MCH: 29.2 pg (ref 26.0–34.0)
MCHC: 35.2 g/dL (ref 30.0–36.0)
MCV: 83 fL (ref 80.0–100.0)
Monocytes Absolute: 0.2 K/uL (ref 0.1–1.0)
Monocytes Relative: 7 %
Neutro Abs: 1.3 K/uL — ABNORMAL LOW (ref 1.7–7.7)
Neutrophils Relative %: 62 %
Platelet Count: 388 K/uL (ref 150–400)
RBC: 3.94 MIL/uL (ref 3.87–5.11)
RDW: 14.4 % (ref 11.5–15.5)
WBC Count: 2 K/uL — ABNORMAL LOW (ref 4.0–10.5)
nRBC: 0 % (ref 0.0–0.2)

## 2024-01-29 LAB — CMP (CANCER CENTER ONLY)
ALT: 9 U/L (ref 0–44)
AST: 16 U/L (ref 15–41)
Albumin: 3.7 g/dL (ref 3.5–5.0)
Alkaline Phosphatase: 64 U/L (ref 38–126)
Anion gap: 4 — ABNORMAL LOW (ref 5–15)
BUN: 7 mg/dL (ref 6–20)
CO2: 28 mmol/L (ref 22–32)
Calcium: 8.8 mg/dL — ABNORMAL LOW (ref 8.9–10.3)
Chloride: 104 mmol/L (ref 98–111)
Creatinine: 0.46 mg/dL (ref 0.44–1.00)
GFR, Estimated: >60 mL/min (ref >60)
Glucose, Bld: 120 mg/dL — ABNORMAL HIGH (ref 70–99)
Potassium: 3.4 mmol/L — ABNORMAL LOW (ref 3.5–5.1)
Sodium: 136 mmol/L (ref 135–145)
Total Bilirubin: 0.5 mg/dL (ref 0.0–1.2)
Total Protein: 6.1 g/dL — ABNORMAL LOW (ref 6.5–8.1)

## 2024-01-29 LAB — GENETIC SCREENING ORDER

## 2024-01-29 MED ORDER — SODIUM CHLORIDE 0.9 % IV SOLN: INTRAVENOUS | Status: AC

## 2024-01-29 MED ORDER — SODIUM CHLORIDE 0.9 % IV SOLN
8.0000 mg/kg | Freq: Once | INTRAVENOUS | Status: AC
  Administered 2024-01-29: 600 mg via INTRAVENOUS
  Filled 2024-01-29: qty 10

## 2024-01-29 MED ORDER — DIPHENHYDRAMINE HCL 50 MG/ML IJ SOLN
25.0000 mg | Freq: Once | INTRAMUSCULAR | Status: AC
  Administered 2024-01-29: 25 mg via INTRAVENOUS
  Filled 2024-01-29: qty 1

## 2024-01-29 MED ORDER — DEXAMETHASONE SODIUM PHOSPHATE 10 MG/ML IJ SOLN
10.0000 mg | Freq: Once | INTRAMUSCULAR | Status: AC
  Administered 2024-01-29: 10 mg via INTRAVENOUS
  Filled 2024-01-29: qty 1

## 2024-01-29 MED ORDER — HYDROCODONE-ACETAMINOPHEN 5-325 MG PO TABS
1.0000 | ORAL_TABLET | Freq: Two times a day (BID) | ORAL | 0 refills | Status: DC | PRN
  Filled 2024-01-29: qty 45, 23d supply, fill #0

## 2024-01-29 MED ORDER — SODIUM CHLORIDE 0.9 % IV SOLN
60.0000 mg/m2 | Freq: Once | INTRAVENOUS | Status: AC
  Administered 2024-01-29: 114 mg via INTRAVENOUS
  Filled 2024-01-29: qty 19

## 2024-01-29 MED ORDER — FAMOTIDINE IN NACL 20-0.9 MG/50ML-% IV SOLN
20.0000 mg | Freq: Once | INTRAVENOUS | Status: AC
  Administered 2024-01-29: 20 mg via INTRAVENOUS
  Filled 2024-01-29: qty 50

## 2024-01-29 MED ORDER — ACETAMINOPHEN 325 MG PO TABS
650.0000 mg | ORAL_TABLET | Freq: Once | ORAL | Status: AC
  Administered 2024-01-29: 650 mg via ORAL
  Filled 2024-01-29: qty 2

## 2024-01-29 NOTE — Progress Notes (Signed)
 Nutrition Follow-up:  Patient with gastric cancer. She is currently receiving reduced dose Zolbetuximab/5FU + Leucovorin  q42d (start 07/09/23). Regimen discontinued 8/13 due to progression. Currently receiving dose reduced paclitaxel  + ramucirumab . Patient is under the care of Dr. Lanny.   Met with patient in infusion. She reports improved nausea. Has had one episode of vomiting. Patient reports this was due to not eating dinner after a fall at home. Patient says she was assisting her mother who almost fell when she slipped. Mother landed on top of her. Patient reports skinned knee and busted lip from tooth. She did not seek medical attention. Ambulating with walker today. Daughter is encouraging pt to come and stay with her in [place]. Patient does not want to move out of her house, but will consider staying until she gets stronger.   Patient has not tried Ensure Clear supplements yet. They are in the fridge. Says she has to get her mind right before trying. Patient endorses stomach pain with intake. This goes away after eating, however po is causing discomfort. She has carafate  at home. Did not know to take this before meals.   Medications: reviewed   Labs: K 3.4, glucose 120  Anthropometrics: Wt 158 lb 4.8 oz today   9/24 - 158 lb 9/17 - 159 lb 1.6 oz  9/3 - 161 lb 8 oz   NUTRITION DIAGNOSIS: Unintended wt loss - stable x 2 weeks   INTERVENTION:  Encourage trying juice type ONS - add to sprite/ginger ale if not tolerated by itself Suggested diluting carafate  in water and drinking 20 minutes prior to po Encourage pt to consider staying with family as she works to regain strength Ambulatory referral to [facility] placed per MD today    MONITORING, EVALUATION, GOAL: wt trends, intake    NEXT VISIT: Wednesday October 22 during infusion

## 2024-01-29 NOTE — Patient Instructions (Signed)
 [facility] - A DEPT OF [facility]  Discharge Instructions: Thank you for choosing [facility] to provide your oncology and hematology care.   If you have a lab appointment with the Cancer Center, please go directly to the Cancer Center and check in at the registration area.   Wear comfortable clothing and clothing appropriate for easy access to any Portacath or PICC line.   We strive to give you quality time with your provider. You may need to reschedule your appointment if you arrive late (15 or more minutes).  Arriving late affects you and other patients whose appointments are after yours.  Also, if you miss three or more appointments without notifying the office, you may be dismissed from the clinic at the provider's discretion.      For prescription refill requests, have your pharmacy contact our office and allow 72 hours for refills to be completed.    Today you received the following chemotherapy and/or immunotherapy agents:  PACLitaxel  (TAXOL ), ramucirumab  (CYRAMZA )    To help prevent nausea and vomiting after your treatment, we encourage you to take your nausea medication as directed.  BELOW ARE SYMPTOMS THAT SHOULD BE REPORTED IMMEDIATELY: *FEVER GREATER THAN 100.4 F (38 C) OR HIGHER *CHILLS OR SWEATING *NAUSEA AND VOMITING THAT IS NOT CONTROLLED WITH YOUR NAUSEA MEDICATION *UNUSUAL SHORTNESS OF BREATH *UNUSUAL BRUISING OR BLEEDING *URINARY PROBLEMS (pain or burning when urinating, or frequent urination) *BOWEL PROBLEMS (unusual diarrhea, constipation, pain near the anus) TENDERNESS IN MOUTH AND THROAT WITH OR WITHOUT PRESENCE OF ULCERS (sore throat, sores in mouth, or a toothache) UNUSUAL RASH, SWELLING OR PAIN  UNUSUAL VAGINAL DISCHARGE OR ITCHING   Items with * indicate a potential emergency and should be followed up as soon as possible or go to the Emergency Department if any problems should occur.  Please show the  CHEMOTHERAPY ALERT CARD or IMMUNOTHERAPY ALERT CARD at check-in to the Emergency Department and triage nurse.  Should you have questions after your visit or need to cancel or reschedule your appointment, please contact [facility] - [facility] DEPT OF [facility]  Dept: [phone]  and follow the prompts.  Office hours are 8:00 a.m. to 4:30 p.m. Monday - Friday. Please note that voicemails left after 4:00 p.m. may not be returned until the following business day.  We are closed weekends and major holidays. You have access to a nurse at all times for urgent questions. Please call the main number to the clinic Dept: [phone] and follow the prompts.   For any non-urgent questions, you may also contact your provider using MyChart. We now offer e-Visits for anyone 36 and older to request care online for non-urgent symptoms. For details visit [email or web address].   Also download the MyChart app! Go to the app store, search MyChart, open the app, select [facility], and log in with your MyChart username and password.

## 2024-01-30 ENCOUNTER — Telehealth: Payer: Self-pay

## 2024-01-30 ENCOUNTER — Encounter: Payer: Self-pay | Admitting: Hematology

## 2024-01-30 ENCOUNTER — Other Ambulatory Visit: Payer: Self-pay

## 2024-01-30 NOTE — Progress Notes (Signed)
 [facility]   [phone] [phone]   Clinic Follow up Note   Patient Care Team: [name], MD as PCP - General (Internal Medicine) [name], MD as Consulting Physician (Oncology)  Date of Service:  01/29/2024  CHIEF COMPLAINT: f/u of gastric cancer  CURRENT THERAPY:  Paclitaxel  and ramucirumab   Oncology History   Gastric cancer (HCC) cT2N0M1 with peritoneal metastasis. 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   -she started first line chemo FLOT on 05/09/22 -She understands that chemotherapy is palliative, to prolong her life.  We are unlikely going to cure her cancer. -PD-L1 0-1%, very limited benefit from PD-L1 immunotherapy, FO revealed FGFR2 amplification and fusion (+), FGFR inhibitors can be considered in future, no other targeted therapy available  -I changed her chemo from FLOT to FOLFOX on 06/20/2022 -She is not able to return to work due to the cancer and treatment related symptoms.  -I subsequently changed her treatment to maintenance Xeloda  in early August 2024, she is tolerating well overall  -her NGS Caris showed positive Claudin 18.2, she is a candidate for zolbetuximab.  -Repeated EGD on March 21, 2023 showed residual gastric cancer.  We discussed option of changing her chemotherapy back to FOLFOX and add zolbetuximab.  -PET 06/03/2023 showed stable disease (no hypermetabolic disease outside stomach) -Patient developed recurrent abdominal pain, similar to the symptoms she had when she was diagnosed.  She agreed to change treatment back to FOLFOX, and add Zolbetuximab. She started on 07/09/2023. She tolerated first cycle poorly and had prolonged recovery. Oxaliplatin  was stop after cycle 1 due to poor tolerance. She continued 5-fu/LV and zolbe every 2  weeks  -CT 10/10/2023 showed stable disease - Unfortunately PET scan on December 06, 2023 showed worsening peritoneal metastasis.  I recommend change treatment to paclitaxel  and ramucirumab , she started on 12/17/23  Assessment & Plan Gastric cancer, body of stomach, on active chemotherapy Gastric cancer located in the body of the stomach, currently undergoing chemotherapy with paclitaxel . Experiencing chemotherapy-induced side effects including peripheral neuropathy, anemia, nausea, and decreased oral intake. Current cycle is the second, with a week off before the next cycle on October 15. A PET scan is planned after three cycles to assess treatment response. CMP results will determine if chemotherapy proceeds today. - Proceed with chemotherapy treatment today if CMP is normal - Schedule PET scan in November after three cycles of chemotherapy - Monitor for worsening neuropathy and consider stopping paclitaxel  if symptoms worsen  Chemotherapy-induced peripheral neuropathy Peripheral neuropathy likely induced by paclitaxel , presenting with weakness and risk of falls. Experienced a fall resulting in a lip injury and knee pain. Neuropathy is being monitored, and the dose of paclitaxel  has been reduced. - Encourage use of a walker or cane to prevent falls - Consider home physical therapy if insurance approves - Monitor neuropathy symptoms closely  Impaired mobility and risk of falls Impaired mobility and increased risk of falls due to peripheral neuropathy and recent fall incident. - Encourage use of a walker or cane - Consider home physical therapy if insurance approves  Cancer-related pain Chronic pain associated with gastric cancer, managed with hydrocodone  at night and Tylenol  during the day. Reports persistent pain, particularly before eating. - Refill hydrocodone  prescription - Continue Tylenol  for daytime pain management  Chemotherapy-induced anemia Mild anemia likely secondary to  chemotherapy, with hemoglobin at  11.5 g/dL. Anemia is not severe and is being monitored.  Chemotherapy-induced nausea Nausea associated with chemotherapy. Reports difficulty with oral intake due to nausea.  Decreased oral intake and hydration Decreased oral intake and hydration due to nausea and stomach discomfort. Managing with small, frequent meals and sips of fluids, including smoothies and slushies, which are better tolerated. - Encourage small, frequent meals and sips of fluids - Discuss with dietitian during infusion visit  Plan - Patient is clinically stable, lab reviewed, adequate for treatment, will proceed chemo today at the same reduced dose and continue every 2 weeks - Follow-up in 2 weeks - Will order restaging PET scan on next visit   SUMMARY OF ONCOLOGIC HISTORY: Oncology History Overview Note   Cancer Staging  Gastric cancer [facility]) Staging form: Stomach, AJCC 8th Edition - Clinical stage from 04/19/2022: Stage IVB (cT2, cN0, pM1) - Signed by [name], MD on 05/08/2022 Total positive nodes: 0     Gastric cancer (HCC)  03/30/2022 Procedure   EGD:  Impression:  - Normal esophagus. - A few gastric polyps. Biopsied. - Gastritis. Biopsied. - Non-bleeding gastric ulcer with no stigmata of bleeding. Biopsied. - Normal examined duodenum. Biopsied.  Findings: Diffuse moderate inflammation characterized by congestion (edema), friability and granularity was found in the cardia, in the gastric fundus and in the gastric body. There were associated erosions in multiple places. Biopsies were taken from the antrum, body, and fundus with a cold forceps for histology. Estimated blood loss was minimal.  One non-bleeding cratered gastric ulcer with no stigmata of bleeding was found on the greater curvature of the stomach. The lesion was 6 mm in largest dimension. The mucosa around the ulcer was heaped and led to some deformity in the antrum. Biopsies were taken with a cold  forceps for histology. Estimated blood loss was minimal.    03/30/2022 Pathology Results   Patient: Mallozzi, Rena P  Accession: [id number]  Diagnosis 1. Surgical [P], duodenal - BENIGN SMALL BOWEL MUCOSA WITH NO SIGNIFICANT PATHOLOGIC CHANGES 2. Surgical [P], gastric antrum - GASTRIC ANTRAL MUCOSA WITH FEATURES OF REACTIVE GASTROPATHY - NEGATIVE FOR H. PYLORI ON H&E STAIN - NEGATIVE FOR INTESTINAL METAPLASIA OR MALIGNANCY 3. Surgical [P], gastric body - GASTRIC OXYNTIC MUCOSA WITH REACTIVE/REPARATIVE CHANGES - NEGATIVE FOR H. PYLORI ON H&E STAIN - NEGATIVE FOR INTESTINAL METAPLASIA, DYSPLASIA OR MALIGNANCY 4. Surgical [P], greater curve ulceration - ADENOCARCINOMA WITH SIGNET RING CELL FEATURES (SEE NOTE) 5. Surgical [P], gastric polyps - ADENOCARCINOMA WITH SIGNET RING CELL FEATURES (SEE NOTE) 6. Surgical [P], fundus (gastric) - ADENOCARCINOMA WITH SIGNET RING CELL FEATURES (SEE NOTE) 7. Surgical [P], colon, ascending, polyp (1) - TUBULAR ADENOMA. - NO HIGH GRADE DYSPLASIA OR MALIGNANCY. 8. Surgical [P], colon, transverse, polyp (1) - TUBULAR ADENOMA. - NO HIGH GRADE DYSPLASIA OR MALIGNANCY.    04/13/2022 Initial Diagnosis   Gastric cancer (HCC)   04/19/2022 Cancer Staging   Staging form: Stomach, AJCC 8th Edition - Clinical stage from 04/19/2022: Stage IVB (cT2, cN0, pM1) - Signed by [name], MD on 05/08/2022 Total positive nodes: 0   05/05/2022 Genetic Testing   Negative genetic testing on the Multi-cancer gene panel + RNA.  FH c.259C>T VUS identified.  The report date is May 05, 2022.  The Multi-Cancer + RNA Panel offered by [facility] includes sequencing and/or deletion/duplication analysis of the following 70 genes:  AIP*, ALK, APC*, ATM*, AXIN2*, BAP1*, BARD1*, BLM*, BMPR1A*, BRCA1*, BRCA2*, BRIP1*, CDC73*, CDH1*, CDK4, CDKN1B*, CDKN2A, CHEK2*, CTNNA1*, DICER1*, EPCAM (del/dup only), EGFR,  FH*, FLCN*, GREM1 (promoter dup only), HOXB13, KIT, LZTR1, MAX*, MBD4, MEN1*,  MET, MITF, MLH1*, MSH2*, MSH3*, MSH6*, MUTYH*, NF1*, NF2*, NTHL1*, PALB2*, PDGFRA, PMS2*, POLD1*, POLE*, POT1*, PRKAR1A*, PTCH1*, PTEN*, RAD51C*, RAD51D*, RB1*, RET, SDHA* (sequencing only), SDHAF2*, SDHB*, SDHC*, SDHD*, SMAD4*, SMARCA4*, SMARCB1*, SMARCE1*, STK11*, SUFU*, TMEM127*, TP53*, TSC1*, TSC2*, VHL*. RNA analysis is performed for * genes.    05/09/2022 - 06/07/2022 Chemotherapy   Patient is on Treatment Plan : GASTROESOPHAGEAL FLOT q14d X 4 cycles      Miscellaneous   Foundation One  Biomarker Findings Microsatellite status- Cannot be determined Tumor Mutational Burden- Cannot be determined  Genomic Findings  FGFR2 amplification,FGFR2-TACC2 fusion,  Rearrangement intron 17 ARAF amplification CCND3 amplification TP53 V255fs*74     05/30/2022 Imaging    IMPRESSION: 1. Mild hypermetabolism corresponding to a dominant left upper quadrant mass and smaller perigastric nodules or nodes. 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 Recommend attention to the dominant left upper quadrant soft tissue mass on follow-up exams to exclude unlikely recurrent lymphoma. 2. No gastric hypermetabolism and no typical findings of metastatic disease.   06/20/2022 - 11/16/2022 Chemotherapy   Patient is on Treatment Plan : GASTRIC FOLFOX q14d x 12 cycles     08/27/2022 Imaging    IMPRESSION: No focal gastric mass on CT.   No findings suspicious for recurrent or metastatic disease.   Stable left upper abdominal soft tissue lesion and small lymph nodes, chronic, favoring treated lymphoma.   11/27/2022 Imaging    IMPRESSION: 1. Questionable thickening of the distal esophagus/GE junction and gastric antrum, consider further evaluation with endoscopy. 2. Chronically stable left upper quadrant nodularity and prominent lymph nodes again favored treated lymphoma. Continued attention on follow-up imaging suggested. 3. No convincing evidence of metastatic disease  in the chest, abdomen or pelvis. 4. Questionable asymmetric wall thickening of the rectum, consider further evaluation with colonoscopy. 5. Mild wall thickening of a nondistended urinary bladder, correlate with urinalysis to exclude cystitis. 6. Hepatic steatosis.   07/10/2023 - 11/27/2023 Chemotherapy   Patient is on Treatment Plan : GASTROESOPHAGEAL Zolbetuximab (800/400) + FOLFOX D1,15,29 q42d x 4 cycles / Zolbetuximab (400) + 5FU + Leucovorin  D1,15,29 q42d     12/17/2023 -  Chemotherapy   Patient is on Treatment Plan : GASTRIC/GE JUNCTION PACLitaxel  D1,8,15 + Ramucirumab  D1,15 q28d        Discussed the use of AI scribe software for clinical note transcription with the patient, who gave verbal consent to proceed.  History of Present Illness Sheryl [name] is a 58 year old female with gastric cancer who presents for follow-up.  She experiences persistent stomach pain before eating, which improves after eating. The pain is constant, and she manages it by consuming small bites and sips of fluids, as larger amounts cause discomfort. Smoothies and slushies soothe her stomach, but she can only consume them in small quantities. Despite eating difficulties, she maintains her weight by consuming small amounts of liquids, such as Gatorade, throughout the day. Her daughter assists by ordering smoothies, which she drinks as much as she can tolerate. She eats soup with vegetables slowly to avoid stomach irritation and can only manage small bites of bananas.  She experiences neuropathy with numbness and tingling in her fingers and toes. She reports weakness in her leg, which led to a fall on Saturday, resulting in a lip injury and knee pain. She was not using a walker at the time of the fall.  She takes  hydrocodone  once nightly for pain and uses Tylenol  during the day as needed. She also takes sucralfate  for her stomach and has Protonix  available. She does not require refills for her nausea medication  or potassium.     All other systems were reviewed with the patient and are negative.  MEDICAL HISTORY:  Past Medical History:  Diagnosis Date   Blood transfusion without reported diagnosis    had transfusion with hysterectomy   Cataract    Colon polyps 2012   Diabetes (HCC) 03/13/2021   Diabetes (HCC) 05/21/2019   Family history of breast cancer    Family history of pancreatic cancer    Family history of stomach cancer    Fibroid    gastric ca 03/2022   GERD (gastroesophageal reflux disease)    H/O blood clots    History of hysterectomy    fibroids and heavy cycles   Hypertension     SURGICAL HISTORY: Past Surgical History:  Procedure Laterality Date   ABDOMINAL HYSTERECTOMY     BIOPSY  04/19/2022   Procedure: BIOPSY;  Surgeon: Wilhelmenia Aloha Raddle., MD;  Location: [facility];  Service: Gastroenterology;;   BIOPSY  03/21/2023   Procedure: BIOPSY;  Surgeon: Wilhelmenia Aloha Raddle., MD;  Location: [facility];  Service: Gastroenterology;;   COLONOSCOPY     ESOPHAGOGASTRODUODENOSCOPY (EGD) WITH PROPOFOL  N/A 04/19/2022   Procedure: ESOPHAGOGASTRODUODENOSCOPY (EGD) WITH PROPOFOL ;  Surgeon: Wilhelmenia Aloha Raddle., MD;  Location: [facility];  Service: Gastroenterology;  Laterality: N/A;   ESOPHAGOGASTRODUODENOSCOPY (EGD) WITH PROPOFOL  N/A 03/21/2023   Procedure: ESOPHAGOGASTRODUODENOSCOPY (EGD) WITH PROPOFOL ;  Surgeon: Wilhelmenia Aloha Raddle., MD;  Location: [facility];  Service: Gastroenterology;  Laterality: N/A;   EUS N/A 04/19/2022   Procedure: UPPER ENDOSCOPIC ULTRASOUND (EUS) RADIAL;  Surgeon: Wilhelmenia Aloha Raddle., MD;  Location: [facility];  Service: Gastroenterology;  Laterality: N/A;   EXCISION OF SKIN TAG  05/03/2022   Procedure: EXCISION OF CHEST WALL SKIN LESION;  Surgeon: Dasie Leonor CROME, MD;  Location: [facility];  Service: General;;   LAPAROSCOPY N/A 05/03/2022   Procedure: LAPAROSCOPY DIAGNOSTIC WITH PERITONEAL WASHINGS;  Surgeon: Dasie Leonor CROME, MD;   Location: [facility];  Service: General;  Laterality: N/A;   POLYPECTOMY  04/19/2022   Procedure: POLYPECTOMY;  Surgeon: Wilhelmenia Aloha Raddle., MD;  Location: [facility];  Service: Gastroenterology;;   PORTACATH PLACEMENT N/A 05/03/2022   Procedure: INSERTION PORT-A-CATH WITH ULTRASOUND GUIDANCE;  Surgeon: Dasie Leonor CROME, MD;  Location: [facility];  Service: General;  Laterality: N/A;   UPPER GASTROINTESTINAL ENDOSCOPY      I have reviewed the social history and family history with the patient and they are unchanged from previous note.  ALLERGIES:  is allergic to aspirin, cyclobenzaprine, naproxen sodium, zithromax [azithromycin dihydrate], oxaliplatin , and dilaudid [hydromorphone].  MEDICATIONS:  Current Outpatient Medications  Medication Sig Dispense Refill   acetaminophen  (TYLENOL ) 500 MG tablet Take 2 tablets (1,000 mg total) by mouth every 8 (eight) hours as needed (pain). 30 tablet 1   amLODipine  (NORVASC ) 10 MG tablet Take 1 tablet (10 mg total) by mouth daily. 30 tablet 2   b complex vitamins capsule Take 1 capsule by mouth daily.     calcium -vitamin D (OSCAL WITH D) 500-5 MG-MCG tablet Take 2 tablets by mouth 2 (two) times daily.     famotidine  (PEPCID ) 20 MG tablet Take 1 tablet (20 mg total) by mouth 2 (two) times daily. 60 tablet 2   HYDROcodone -acetaminophen  (NORCO/VICODIN) 5-325 MG tablet Take 1 tablet by mouth every 12 (twelve) hours  as needed for moderate pain (pain score 4-6). 45 tablet 0   lidocaine -prilocaine  (EMLA ) cream Apply 1 Application topically as needed. 30 g 1   metoCLOPramide  (REGLAN ) 10 MG tablet Take 1 tablet (10 mg total) by mouth every 8 (eight) hours as needed for nausea. 60 tablet 1   pantoprazole  (PROTONIX ) 40 MG tablet Take 1 tablet (40 mg total) by mouth daily. 30 tablet 2   potassium chloride  (KLOR-CON  M) 10 MEQ tablet Take 1 tablet (10 mEq total) by mouth 2 (two) times daily. 60 tablet 1   prochlorperazine  (COMPAZINE ) 10 MG tablet Take 1 tablet (10 mg total)  by mouth every 6 (six) hours as needed for nausea or vomiting. 30 tablet 2   promethazine  (PHENERGAN ) 50 MG tablet Take 1 tablet (50 mg total) by mouth 2 (two) times daily as needed for nausea or vomiting. 45 tablet 1   scopolamine  (TRANSDERM-SCOP) 1 MG/3DAYS Place 1 patch (1.5 mg total) onto the skin every 3 (three) days. 10 patch 1   sucralfate  (CARAFATE ) 1 g tablet Take 1 tablet (1 g total) by mouth 2 (two) times daily. 60 tablet 6   zinc gluconate 50 MG tablet Take 50 mg by mouth daily.     No current facility-administered medications for this visit.    PHYSICAL EXAMINATION: ECOG PERFORMANCE STATUS: 2 - Symptomatic, <50% confined to bed  Vitals:   01/29/24 0950  BP: 114/84  Pulse: (!) 103  Resp: 15  Temp: (!) 97.3 F (36.3 C)  SpO2: 99%   Wt Readings from Last 3 Encounters:  01/29/24 158 lb 4.8 oz (71.8 kg)  01/22/24 158 lb (71.7 kg)  01/15/24 159 lb 1.6 oz (72.2 kg)     GENERAL:alert, no distress and comfortable SKIN: skin color, texture, turgor are normal, no rashes or significant lesions EYES: normal, Conjunctiva are pink and non-injected, sclera clear NECK: supple, thyroid  normal size, non-tender, without nodularity LYMPH:  no palpable lymphadenopathy in the cervical, axillary  LUNGS: clear to auscultation and percussion with normal breathing effort HEART: regular rate & rhythm and no murmurs and no lower extremity edema ABDOMEN:abdomen soft, non-tender and normal bowel sounds Musculoskeletal:no cyanosis of digits and no clubbing    Physical Exam    LABORATORY DATA:  I have reviewed the data as listed    Latest Ref Rng & Units 01/29/2024    9:27 AM 01/22/2024    9:27 AM 01/15/2024    8:37 AM  CBC  WBC 4.0 - 10.5 K/uL 2.0  3.6  2.0   Hemoglobin 12.0 - 15.0 g/dL 88.4  87.3  87.5   Hematocrit 36.0 - 46.0 % 32.7  35.9  34.8   Platelets 150 - 400 K/uL 388  374  412         Latest Ref Rng & Units 01/29/2024    9:27 AM 01/22/2024    9:27 AM 01/15/2024    8:37  AM  CMP  Glucose 70 - 99 mg/dL 879  882  896   BUN 6 - 20 mg/dL 7  8  6    Creatinine 0.44 - 1.00 mg/dL 9.53  9.46  9.54   Sodium 135 - 145 mmol/L 136  138  139   Potassium 3.5 - 5.1 mmol/L 3.4  3.2  3.0   Chloride 98 - 111 mmol/L 104  103  104   CO2 22 - 32 mmol/L 28  30  29    Calcium  8.9 - 10.3 mg/dL 8.8  9.0  8.9   Total  Protein 6.5 - 8.1 g/dL 6.1  6.4  6.3   Total Bilirubin 0.0 - 1.2 mg/dL 0.5  0.4  0.4   Alkaline Phos 38 - 126 U/L 64  77  79   AST 15 - 41 U/L 16  17  14    ALT 0 - 44 U/L 9  9  7        RADIOGRAPHIC STUDIES: I have personally reviewed the radiological images as listed and agreed with the findings in the report. No results found.    Orders Placed This Encounter  Procedures   CBC with Differential (Cancer Center Only)    Standing Status:   Future    Expected Date:   03/11/2024    Expiration Date:   03/11/2025   CMP (Cancer Center only)    Standing Status:   Future    Expected Date:   03/11/2024    Expiration Date:   03/11/2025   CBC with Differential (Cancer Center Only)    Standing Status:   Future    Expected Date:   03/18/2024    Expiration Date:   03/18/2025   CMP (Cancer Center only)    Standing Status:   Future    Expected Date:   03/18/2024    Expiration Date:   03/18/2025   CBC with Differential (Cancer Center Only)    Standing Status:   Future    Expected Date:   03/25/2024    Expiration Date:   03/25/2025   CMP (Cancer Center only)    Standing Status:   Future    Expected Date:   03/25/2024    Expiration Date:   03/25/2025   All questions were answered. The patient knows to call the clinic with any problems, questions or concerns. No barriers to learning was detected. The total time spent in the appointment was 25 minutes, including review of chart and various tests results, discussions about plan of care and coordination of care plan     Onita Mattock, MD 01/30/2024

## 2024-01-30 NOTE — Telephone Encounter (Signed)
 Contacted patient via telephone call. Let patient know that we are having trouble finding at home PT agency that is taking new patients w/ her insurance. Patient voiced understanding. Patient will contact her insurance to see which agencies are in network and follow-up w/ us  so we can further assist.

## 2024-02-04 LAB — SIGNATERA
SIGNATERA MTM READOUT: 5.29 MTM/ml — AB
SIGNATERA TEST RESULT: POSITIVE — AB

## 2024-02-07 LAB — OPHTHALMOLOGY REPORT-SCANNED

## 2024-02-10 ENCOUNTER — Other Ambulatory Visit: Payer: Self-pay | Admitting: Hematology

## 2024-02-11 ENCOUNTER — Other Ambulatory Visit (HOSPITAL_COMMUNITY): Payer: Self-pay

## 2024-02-11 MED ORDER — PANTOPRAZOLE SODIUM 40 MG PO TBEC
40.0000 mg | DELAYED_RELEASE_TABLET | Freq: Every day | ORAL | 2 refills | Status: DC
  Filled 2024-02-11: qty 30, 30d supply, fill #0
  Filled 2024-03-11: qty 30, 30d supply, fill #1
  Filled 2024-04-15: qty 30, 30d supply, fill #2

## 2024-02-11 NOTE — Assessment & Plan Note (Signed)
 [id number] with peritoneal metastasis. 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   -she started first line chemo FLOT on 05/09/22 -She understands that chemotherapy is palliative, to prolong her life.  We are unlikely going to cure her cancer. -PD-L1 0-1%, very limited benefit from PD-L1 immunotherapy, FO revealed FGFR2 amplification and fusion (+), FGFR inhibitors can be considered in future, no other targeted therapy available  -I changed her chemo from FLOT to FOLFOX on 06/20/2022 -She is not able to return to work due to the cancer and treatment related symptoms.  -I subsequently changed her treatment to maintenance Xeloda  in early August 2024, she is tolerating well overall  -her NGS Caris showed positive Claudin 18.2, she is a candidate for zolbetuximab.  -Repeated EGD on March 21, 2023 showed residual gastric cancer.  We discussed option of changing her chemotherapy back to FOLFOX and add zolbetuximab.  -PET 06/03/2023 showed stable disease (no hypermetabolic disease outside stomach) -Patient developed recurrent abdominal pain, similar to the symptoms she had when she was diagnosed.  She agreed to change treatment back to FOLFOX, and add Zolbetuximab. She started on 07/09/2023. She tolerated first cycle poorly and had prolonged recovery. Oxaliplatin  was stop after cycle 1 due to poor tolerance. She continued 5-fu/LV and zolbe every 2 weeks  -CT 10/10/2023 showed stable disease - Unfortunately PET scan on December 06, 2023 showed worsening peritoneal metastasis.  I recommend change treatment to paclitaxel  and ramucirumab , she started on 12/17/23

## 2024-02-12 ENCOUNTER — Ambulatory Visit

## 2024-02-12 ENCOUNTER — Other Ambulatory Visit (HOSPITAL_COMMUNITY): Payer: Self-pay

## 2024-02-12 ENCOUNTER — Inpatient Hospital Stay (HOSPITAL_BASED_OUTPATIENT_CLINIC_OR_DEPARTMENT_OTHER): Admitting: Hematology

## 2024-02-12 ENCOUNTER — Inpatient Hospital Stay

## 2024-02-12 ENCOUNTER — Encounter: Payer: Self-pay | Admitting: Hematology

## 2024-02-12 VITALS — BP 128/88 | HR 95 | Temp 97.2°F | Resp 17 | Ht 68.0 in | Wt 157.0 lb

## 2024-02-12 DIAGNOSIS — C162 Malignant neoplasm of body of stomach: Secondary | ICD-10-CM

## 2024-02-12 DIAGNOSIS — Z5112 Encounter for antineoplastic immunotherapy: Secondary | ICD-10-CM | POA: Diagnosis not present

## 2024-02-12 LAB — CMP (CANCER CENTER ONLY)
ALT: 7 U/L (ref 0–44)
AST: 16 U/L (ref 15–41)
Albumin: 3.7 g/dL (ref 3.5–5.0)
Alkaline Phosphatase: 75 U/L (ref 38–126)
Anion gap: 5 (ref 5–15)
BUN: 10 mg/dL (ref 6–20)
CO2: 29 mmol/L (ref 22–32)
Calcium: 9.5 mg/dL (ref 8.9–10.3)
Chloride: 104 mmol/L (ref 98–111)
Creatinine: 0.51 mg/dL (ref 0.44–1.00)
GFR, Estimated: >60 mL/min (ref >60)
Glucose, Bld: 103 mg/dL — ABNORMAL HIGH (ref 70–99)
Potassium: 3.7 mmol/L (ref 3.5–5.1)
Sodium: 138 mmol/L (ref 135–145)
Total Bilirubin: 0.4 mg/dL (ref 0.0–1.2)
Total Protein: 6.3 g/dL — ABNORMAL LOW (ref 6.5–8.1)

## 2024-02-12 LAB — CBC WITH DIFFERENTIAL (CANCER CENTER ONLY)
Abs Immature Granulocytes: 0 K/uL (ref 0.00–0.07)
Basophils Absolute: 0 K/uL (ref 0.0–0.1)
Basophils Relative: 1 %
Eosinophils Absolute: 0.1 K/uL (ref 0.0–0.5)
Eosinophils Relative: 2 %
HCT: 34.4 % — ABNORMAL LOW (ref 36.0–46.0)
Hemoglobin: 11.9 g/dL — ABNORMAL LOW (ref 12.0–15.0)
Immature Granulocytes: 0 %
Lymphocytes Relative: 27 %
Lymphs Abs: 0.7 K/uL (ref 0.7–4.0)
MCH: 29.5 pg (ref 26.0–34.0)
MCHC: 34.6 g/dL (ref 30.0–36.0)
MCV: 85.1 fL (ref 80.0–100.0)
Monocytes Absolute: 0.5 K/uL (ref 0.1–1.0)
Monocytes Relative: 20 %
Neutro Abs: 1.3 K/uL — ABNORMAL LOW (ref 1.7–7.7)
Neutrophils Relative %: 50 %
Platelet Count: 427 K/uL — ABNORMAL HIGH (ref 150–400)
RBC: 4.04 MIL/uL (ref 3.87–5.11)
RDW: 15.2 % (ref 11.5–15.5)
WBC Count: 2.6 K/uL — ABNORMAL LOW (ref 4.0–10.5)
nRBC: 0 % (ref 0.0–0.2)

## 2024-02-12 LAB — TOTAL PROTEIN, URINE DIPSTICK: Protein, ur: NEGATIVE mg/dL

## 2024-02-12 LAB — TSH: TSH: 1.96 u[IU]/mL (ref 0.350–4.500)

## 2024-02-12 MED ORDER — ACETAMINOPHEN 325 MG PO TABS
650.0000 mg | ORAL_TABLET | Freq: Once | ORAL | Status: AC
  Administered 2024-02-12: 650 mg via ORAL
  Filled 2024-02-12: qty 2

## 2024-02-12 MED ORDER — DEXAMETHASONE SOD PHOSPHATE PF 10 MG/ML IJ SOLN
10.0000 mg | Freq: Once | INTRAMUSCULAR | Status: AC
  Administered 2024-02-12: 10 mg via INTRAVENOUS

## 2024-02-12 MED ORDER — DIPHENHYDRAMINE HCL 50 MG/ML IJ SOLN
25.0000 mg | Freq: Once | INTRAMUSCULAR | Status: AC
  Administered 2024-02-12: 25 mg via INTRAVENOUS
  Filled 2024-02-12: qty 1

## 2024-02-12 MED ORDER — SODIUM CHLORIDE 0.9 % IV SOLN
8.0000 mg/kg | Freq: Once | INTRAVENOUS | Status: AC
  Administered 2024-02-12: 600 mg via INTRAVENOUS
  Filled 2024-02-12: qty 10

## 2024-02-12 MED ORDER — SODIUM CHLORIDE 0.9 % IV SOLN: INTRAVENOUS | Status: AC

## 2024-02-12 MED ORDER — FAMOTIDINE IN NACL 20-0.9 MG/50ML-% IV SOLN
20.0000 mg | Freq: Once | INTRAVENOUS | Status: AC
  Administered 2024-02-12: 20 mg via INTRAVENOUS
  Filled 2024-02-12: qty 50

## 2024-02-12 MED ORDER — POTASSIUM CHLORIDE CRYS ER 10 MEQ PO TBCR
10.0000 meq | EXTENDED_RELEASE_TABLET | Freq: Two times a day (BID) | ORAL | 1 refills | Status: DC
  Filled 2024-03-11: qty 60, 30d supply, fill #0

## 2024-02-12 MED ORDER — SODIUM CHLORIDE 0.9 % IV SOLN
60.0000 mg/m2 | Freq: Once | INTRAVENOUS | Status: AC
  Administered 2024-02-12: 114 mg via INTRAVENOUS
  Filled 2024-02-12: qty 19

## 2024-02-12 NOTE — Patient Instructions (Signed)
 [facility] - A DEPT OF [facility]  Discharge Instructions: Thank you for choosing [facility] to provide your oncology and hematology care.   If you have a lab appointment with the Cancer Center, please go directly to the Cancer Center and check in at the registration area.   Wear comfortable clothing and clothing appropriate for easy access to any Portacath or PICC line.   We strive to give you quality time with your provider. You may need to reschedule your appointment if you arrive late (15 or more minutes).  Arriving late affects you and other patients whose appointments are after yours.  Also, if you miss three or more appointments without notifying the office, you may be dismissed from the clinic at the provider's discretion.      For prescription refill requests, have your pharmacy contact our office and allow 72 hours for refills to be completed.    Today you received the following chemotherapy and/or immunotherapy agents cyramza  and taxol       To help prevent nausea and vomiting after your treatment, we encourage you to take your nausea medication as directed.  BELOW ARE SYMPTOMS THAT SHOULD BE REPORTED IMMEDIATELY: *FEVER GREATER THAN 100.4 F (38 C) OR HIGHER *CHILLS OR SWEATING *NAUSEA AND VOMITING THAT IS NOT CONTROLLED WITH YOUR NAUSEA MEDICATION *UNUSUAL SHORTNESS OF BREATH *UNUSUAL BRUISING OR BLEEDING *URINARY PROBLEMS (pain or burning when urinating, or frequent urination) *BOWEL PROBLEMS (unusual diarrhea, constipation, pain near the anus) TENDERNESS IN MOUTH AND THROAT WITH OR WITHOUT PRESENCE OF ULCERS (sore throat, sores in mouth, or a toothache) UNUSUAL RASH, SWELLING OR PAIN  UNUSUAL VAGINAL DISCHARGE OR ITCHING   Items with * indicate a potential emergency and should be followed up as soon as possible or go to the Emergency Department if any problems should occur.  Please show the CHEMOTHERAPY ALERT [place] or  IMMUNOTHERAPY ALERT [place] at check-in to the Emergency Department and triage nurse.  Should you have questions after your visit or need to cancel or reschedule your appointment, please contact [facility] - A DEPT OF [facility]  Dept: [phone]  and follow the prompts.  Office hours are 8:00 a.m. to 4:30 p.m. Monday - Friday. Please note that voicemails left after 4:00 p.m. may not be returned until the following business day.  We are closed weekends and major holidays. You have access to a nurse at all times for urgent questions. Please call the main number to the clinic Dept: [phone] and follow the prompts.   For any non-urgent questions, you may also contact your provider using MyChart. We now offer e-Visits for anyone 69 and older to request care online for non-urgent symptoms. For details visit [email or web address].   Also download the MyChart app! Go to the app store, search MyChart, open the app, select [place], and log in with your MyChart username and password.

## 2024-02-12 NOTE — Progress Notes (Signed)
 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   -she started first line chemo FLOT on 05/09/22 -She understands that chemotherapy is palliative, to prolong her life.  We are unlikely going to cure her cancer. -PD-L1 0-1%, very limited benefit from PD-L1 immunotherapy, FO revealed FGFR2 amplification and fusion (+), FGFR inhibitors can be considered in future, no other targeted therapy available  -I changed her chemo from FLOT to FOLFOX on 06/20/2022 -She is not able to return to work due to the cancer and treatment related symptoms.  -I subsequently changed her treatment to maintenance Xeloda  in early August 2024, she is tolerating well overall  -her NGS Caris showed positive Claudin 18.2, she is a candidate for zolbetuximab.  -Repeated EGD on March 21, 2023 showed residual gastric cancer.  We discussed option of changing her chemotherapy back to FOLFOX and add zolbetuximab.  -PET 06/03/2023 showed stable disease (no hypermetabolic disease outside stomach) -Patient developed recurrent abdominal pain, similar to the symptoms she had when she was diagnosed.  She agreed to change treatment back to FOLFOX, and add Zolbetuximab. She started on 07/09/2023. She tolerated first cycle poorly and had prolonged recovery. Oxaliplatin  was stop after cycle 1 due to poor tolerance. She continued  5-fu/LV and zolbe every 2 weeks  -CT 10/10/2023 showed stable disease - Unfortunately PET scan on December 06, 2023 showed worsening peritoneal metastasis.  I recommend change treatment to paclitaxel  and ramucirumab , she started on 12/17/23  Assessment & Plan Metastatic gastric cancer Undergoing chemotherapy, currently on cycle three. Treatment involves two weeks of chemotherapy followed by an off week. A PET scan is planned to assess treatment response, with a CT scan as an alternative if insurance does not approve the PET scan. - Order PET scan in three weeks, during the off week from chemotherapy - If PET scan is not approved by insurance, order CT scan instead  Neutropenia secondary to chemotherapy Neutrophil count is low at 1.3, but not critically low, allowing continuation of treatment. Received a shot for low blood counts in September and may require another shot if insurance approves. - Request insurance approval for a shot to address low blood counts - Administer shot on Saturday if approved  Chemotherapy-induced peripheral neuropathy, right lower extremity weakness  Reports persistent tingling and numbness in the right lower extremity, particularly when touched on the thigh or calf, with tingling extending to the feet. Symptoms are not worsening.  Cancer-related pain Experiences severe pain at times, managed with pain medication taken once at night. Pain is sometimes unmanageable, requiring her to lie down until it subsides.  Malnutrition secondary to cancer Reports eating adequately but continues to lose weight, having lost another pound recently. - Encourage small, frequent meals to maintain nutrition  Plan - Patient is clinically stable, lab reviewed, adequate for treatment, will proceed C3D1 chemo today. - ANC 1.3 today, will arrange 1 dose G-CSF later this week - Follow-up in 2 weeks before  his day 15 chemotherapy - Restaging PET scan in 3 weeks   SUMMARY OF ONCOLOGIC  HISTORY: Oncology History Overview Note   Cancer Staging  Gastric cancer [facility]) Staging form: Stomach, AJCC 8th Edition - Clinical stage from 04/19/2022: Stage IVB (cT2, cN0, pM1) - Signed by [name], MD on 05/08/2022 Total positive nodes: 0     Gastric cancer (HCC)  03/30/2022 Procedure   EGD:  Impression:  - Normal esophagus. - A few gastric polyps. Biopsied. - Gastritis. Biopsied. - Non-bleeding gastric ulcer with no stigmata of bleeding. Biopsied. - Normal examined duodenum. Biopsied.  Findings: Diffuse moderate inflammation characterized by congestion (edema), friability and granularity was found in the cardia, in the gastric fundus and in the gastric body. There were associated erosions in multiple places. Biopsies were taken from the antrum, body, and fundus with a cold forceps for histology. Estimated blood loss was minimal.  One non-bleeding cratered gastric ulcer with no stigmata of bleeding was found on the greater curvature of the stomach. The lesion was 6 mm in largest dimension. The mucosa around the ulcer was heaped and led to some deformity in the antrum. Biopsies were taken with a cold forceps for histology. Estimated blood loss was minimal.    03/30/2022 Pathology Results   Patient: [name], [name]  Diagnosis 1. Surgical [P], duodenal - BENIGN SMALL BOWEL MUCOSA WITH NO SIGNIFICANT PATHOLOGIC CHANGES 2. Surgical [P], gastric antrum - GASTRIC ANTRAL MUCOSA WITH FEATURES OF REACTIVE GASTROPATHY - NEGATIVE FOR H. PYLORI ON H&E STAIN - NEGATIVE FOR INTESTINAL METAPLASIA OR MALIGNANCY 3. Surgical [P], gastric body - GASTRIC OXYNTIC MUCOSA WITH REACTIVE/REPARATIVE CHANGES - NEGATIVE FOR H. PYLORI ON H&E STAIN - NEGATIVE FOR INTESTINAL METAPLASIA, DYSPLASIA OR MALIGNANCY 4. Surgical [P], greater curve ulceration - ADENOCARCINOMA WITH SIGNET RING CELL FEATURES (SEE NOTE) 5. Surgical [P], gastric polyps - ADENOCARCINOMA WITH SIGNET RING  CELL FEATURES (SEE NOTE) 6. Surgical [P], fundus (gastric) - ADENOCARCINOMA WITH SIGNET RING CELL FEATURES (SEE NOTE) 7. Surgical [P], colon, ascending, polyp (1) - TUBULAR ADENOMA. - NO HIGH GRADE DYSPLASIA OR MALIGNANCY. 8. Surgical [P], colon, transverse, polyp (1) - TUBULAR ADENOMA. - NO HIGH GRADE DYSPLASIA OR MALIGNANCY.    04/13/2022 Initial Diagnosis   Gastric cancer (HCC)   04/19/2022 Cancer Staging   Staging form: Stomach, AJCC 8th Edition - Clinical stage from 04/19/2022: Stage IVB (cT2, cN0, pM1) - Signed by [name], MD on 05/08/2022 Total positive nodes: 0   05/05/2022 Genetic Testing   Negative genetic testing on the Multi-cancer gene panel + RNA.  FH c.259C>T VUS identified.  The report date is May 05, 2022.  The Multi-Cancer + RNA Panel offered by [facility] includes sequencing and/or deletion/duplication analysis of the following 70 genes:  AIP*, ALK, APC*, ATM*, AXIN2*, BAP1*, BARD1*, BLM*, BMPR1A*, BRCA1*, BRCA2*, BRIP1*, CDC73*, CDH1*, CDK4, CDKN1B*, CDKN2A, CHEK2*, CTNNA1*, DICER1*, EPCAM (del/dup only), EGFR, FH*, FLCN*, GREM1 (promoter dup only), HOXB13, KIT, LZTR1, MAX*, MBD4, MEN1*, MET, MITF, MLH1*, MSH2*, MSH3*, MSH6*, MUTYH*, NF1*, NF2*, NTHL1*, PALB2*, PDGFRA, PMS2*, POLD1*, POLE*, POT1*, PRKAR1A*, PTCH1*, PTEN*, RAD51C*, RAD51D*, RB1*, RET, SDHA* (sequencing only), SDHAF2*, SDHB*, SDHC*, SDHD*, SMAD4*, SMARCA4*, SMARCB1*, SMARCE1*, STK11*, SUFU*, TMEM127*, TP53*, TSC1*, TSC2*, VHL*. RNA analysis is performed for * genes.    05/09/2022 - 06/07/2022 Chemotherapy   Patient is on Treatment Plan : GASTROESOPHAGEAL FLOT q14d X 4 cycles      Miscellaneous   Foundation One  Biomarker Findings Microsatellite status- Cannot be determined Tumor Mutational Burden- Cannot  be determined  Genomic Findings  FGFR2 amplification,FGFR2-TACC2 fusion,  Rearrangement intron 17 ARAF amplification CCND3 amplification TP53 V237fs*74     05/30/2022 Imaging     IMPRESSION: 1. Mild hypermetabolism corresponding to a dominant left upper quadrant mass and smaller perigastric nodules or nodes. 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 Recommend attention to the dominant left upper quadrant soft tissue mass on follow-up exams to exclude unlikely recurrent lymphoma. 2. No gastric hypermetabolism and no typical findings of metastatic disease.   06/20/2022 - 11/16/2022 Chemotherapy   Patient is on Treatment Plan : GASTRIC FOLFOX q14d x 12 cycles     08/27/2022 Imaging    IMPRESSION: No focal gastric mass on CT.   No findings suspicious for recurrent or metastatic disease.   Stable left upper abdominal soft tissue lesion and small lymph nodes, chronic, favoring treated lymphoma.   11/27/2022 Imaging    IMPRESSION: 1. Questionable thickening of the distal esophagus/GE junction and gastric antrum, consider further evaluation with endoscopy. 2. Chronically stable left upper quadrant nodularity and prominent lymph nodes again favored treated lymphoma. Continued attention on follow-up imaging suggested. 3. No convincing evidence of metastatic disease in the chest, abdomen or pelvis. 4. Questionable asymmetric wall thickening of the rectum, consider further evaluation with colonoscopy. 5. Mild wall thickening of a nondistended urinary bladder, correlate with urinalysis to exclude cystitis. 6. Hepatic steatosis.   07/10/2023 - 11/27/2023 Chemotherapy   Patient is on Treatment Plan : GASTROESOPHAGEAL Zolbetuximab (800/400) + FOLFOX D1,15,29 q42d x 4 cycles / Zolbetuximab (400) + 5FU + Leucovorin  D1,15,29 q42d     12/17/2023 -  Chemotherapy   Patient is on Treatment Plan : GASTRIC/GE JUNCTION PACLitaxel  D1,8,15 + Ramucirumab  D1,15 q28d        Discussed the use of AI scribe software for clinical note transcription with the patient, who gave verbal consent to proceed.  History of Present Illness Sheryl [name]  is a 58 year old female with metastatic gastric cancer who presents for follow-up.  She experiences pain that can become severe, requiring medication and rest. She takes pain medication once at night and has an adequate supply at home. Despite efforts to maintain nutrition, she continues to lose weight, having lost another pound recently. She is trying to eat small, frequent meals.  She is undergoing chemotherapy, with today marking the start of her third cycle. She uses ice during her chemotherapy sessions. Her current medications include pantoprazole  as needed and potassium 10 mg twice a day.  She experiences weakness in her right leg with tingling sensations radiating from her thigh to her foot. Numbness and tingling in her hands are present but not severe.     All other systems were reviewed with the patient and are negative.  MEDICAL HISTORY:  Past Medical History:  Diagnosis Date   Blood transfusion without reported diagnosis    had transfusion with hysterectomy   Cataract    Colon polyps 2012   Diabetes (HCC) 03/13/2021   Diabetes (HCC) 05/21/2019   Family history of breast cancer    Family history of pancreatic cancer    Family history of stomach cancer    Fibroid    gastric ca 03/2022   GERD (gastroesophageal reflux disease)    H/O blood clots    History of hysterectomy    fibroids and heavy cycles   Hypertension     SURGICAL HISTORY: Past Surgical History:  Procedure Laterality Date   ABDOMINAL HYSTERECTOMY  BIOPSY  04/19/2022   Procedure: BIOPSY;  Surgeon: Wilhelmenia Aloha Raddle., MD;  Location: [facility];  Service: Gastroenterology;;   BIOPSY  03/21/2023   Procedure: BIOPSY;  Surgeon: Wilhelmenia Aloha Raddle., MD;  Location: [facility];  Service: Gastroenterology;;   COLONOSCOPY     ESOPHAGOGASTRODUODENOSCOPY (EGD) WITH PROPOFOL  N/A 04/19/2022   Procedure: ESOPHAGOGASTRODUODENOSCOPY (EGD) WITH PROPOFOL ;  Surgeon: Wilhelmenia Aloha Raddle., MD;  Location: [facility];  Service: Gastroenterology;  Laterality: N/A;   ESOPHAGOGASTRODUODENOSCOPY (EGD) WITH PROPOFOL  N/A 03/21/2023   Procedure: ESOPHAGOGASTRODUODENOSCOPY (EGD) WITH PROPOFOL ;  Surgeon: Wilhelmenia Aloha Raddle., MD;  Location: [facility];  Service: Gastroenterology;  Laterality: N/A;   EUS N/A 04/19/2022   Procedure: UPPER ENDOSCOPIC ULTRASOUND (EUS) RADIAL;  Surgeon: Wilhelmenia Aloha Raddle., MD;  Location: [facility];  Service: Gastroenterology;  Laterality: N/A;   EXCISION OF SKIN TAG  05/03/2022   Procedure: EXCISION OF CHEST WALL SKIN LESION;  Surgeon: Dasie Leonor CROME, MD;  Location: [facility];  Service: General;;   LAPAROSCOPY N/A 05/03/2022   Procedure: LAPAROSCOPY DIAGNOSTIC WITH PERITONEAL WASHINGS;  Surgeon: Dasie Leonor CROME, MD;  Location: [facility];  Service: General;  Laterality: N/A;   POLYPECTOMY  04/19/2022   Procedure: POLYPECTOMY;  Surgeon: Wilhelmenia Aloha Raddle., MD;  Location: [facility];  Service: Gastroenterology;;   PORTACATH PLACEMENT N/A 05/03/2022   Procedure: INSERTION PORT-A-CATH WITH ULTRASOUND GUIDANCE;  Surgeon: Dasie Leonor CROME, MD;  Location: [facility];  Service: General;  Laterality: N/A;   UPPER GASTROINTESTINAL ENDOSCOPY      I have reviewed the social history and family history with the patient and they are unchanged from previous note.  ALLERGIES:  is allergic to aspirin, cyclobenzaprine, naproxen sodium, zithromax [azithromycin dihydrate], oxaliplatin , and dilaudid [hydromorphone].  MEDICATIONS:  Current Outpatient Medications  Medication Sig Dispense Refill   acetaminophen  (TYLENOL ) 500 MG tablet Take 2 tablets (1,000 mg total) by mouth every 8 (eight) hours as needed (pain). 30 tablet 1   amLODipine  (NORVASC ) 10 MG tablet Take 1 tablet (10 mg total) by mouth daily. 30 tablet 2   b complex vitamins capsule Take 1 capsule by mouth daily.     calcium -vitamin D (OSCAL WITH D) 500-5 MG-MCG tablet Take 2 tablets by mouth 2 (two) times daily.     famotidine   (PEPCID ) 20 MG tablet Take 1 tablet (20 mg total) by mouth 2 (two) times daily. 60 tablet 2   HYDROcodone -acetaminophen  (NORCO/VICODIN) 5-325 MG tablet Take 1 tablet by mouth every 12 (twelve) hours as needed for moderate pain (pain score 4-6). 45 tablet 0   lidocaine -prilocaine  (EMLA ) cream Apply 1 Application topically as needed. 30 g 1   metoCLOPramide  (REGLAN ) 10 MG tablet Take 1 tablet (10 mg total) by mouth every 8 (eight) hours as needed for nausea. 60 tablet 1   pantoprazole  (PROTONIX ) 40 MG tablet Take 1 tablet (40 mg total) by mouth daily. 30 tablet 2   prochlorperazine  (COMPAZINE ) 10 MG tablet Take 1 tablet (10 mg total) by mouth every 6 (six) hours as needed for nausea or vomiting. 30 tablet 2   promethazine  (PHENERGAN ) 50 MG tablet Take 1 tablet (50 mg total) by mouth 2 (two) times daily as needed for nausea or vomiting. 45 tablet 1   scopolamine  (TRANSDERM-SCOP) 1 MG/3DAYS Place 1 patch (1.5 mg total) onto the skin every 3 (three) days. 10 patch 1   sucralfate  (CARAFATE ) 1 g tablet Take 1 tablet (1 g total) by mouth 2 (two) times daily. 60 tablet 6   zinc gluconate 50  MG tablet Take 50 mg by mouth daily.     potassium chloride  (KLOR-CON  M) 10 MEQ tablet Take 1 tablet (10 mEq total) by mouth 2 (two) times daily. 60 tablet 1   No current facility-administered medications for this visit.    PHYSICAL EXAMINATION: ECOG PERFORMANCE STATUS: 2 - Symptomatic, <50% confined to bed  Vitals:   02/12/24 1038  BP: 128/88  Pulse: 95  Resp: 17  Temp: (!) 97.2 F (36.2 C)  SpO2: 98%   Wt Readings from Last 3 Encounters:  02/12/24 157 lb (71.2 kg)  01/29/24 158 lb 4.8 oz (71.8 kg)  01/22/24 158 lb (71.7 kg)     GENERAL:alert, no distress and comfortable SKIN: skin color, texture, turgor are normal, no rashes or significant lesions EYES: normal, Conjunctiva are pink and non-injected, sclera clear NECK: supple, thyroid  normal size, non-tender, without nodularity LYMPH:  no palpable  lymphadenopathy in the cervical, axillary  LUNGS: clear to auscultation and percussion with normal breathing effort HEART: regular rate & rhythm and no murmurs and no lower extremity edema ABDOMEN:abdomen soft, non-tender and normal bowel sounds Musculoskeletal:no cyanosis of digits and no clubbing  NEURO: alert & oriented x 3 with fluent speech, no focal motor/sensory deficits  Physical Exam    LABORATORY DATA:  I have reviewed the data as listed    Latest Ref Rng & Units 02/12/2024   10:16 AM 01/29/2024    9:27 AM 01/22/2024    9:27 AM  CBC  WBC 4.0 - 10.5 K/uL 2.6  2.0  3.6   Hemoglobin 12.0 - 15.0 g/dL 88.0  88.4  87.3   Hematocrit 36.0 - 46.0 % 34.4  32.7  35.9   Platelets 150 - 400 K/uL 427  388  374         Latest Ref Rng & Units 02/12/2024   10:16 AM 01/29/2024    9:27 AM 01/22/2024    9:27 AM  CMP  Glucose 70 - 99 mg/dL 896  879  882   BUN 6 - 20 mg/dL 10  7  8    Creatinine 0.44 - 1.00 mg/dL 9.48  9.53  9.46   Sodium 135 - 145 mmol/L 138  136  138   Potassium 3.5 - 5.1 mmol/L 3.7  3.4  3.2   Chloride 98 - 111 mmol/L 104  104  103   CO2 22 - 32 mmol/L 29  28  30    Calcium  8.9 - 10.3 mg/dL 9.5  8.8  9.0   Total Protein 6.5 - 8.1 g/dL 6.3  6.1  6.4   Total Bilirubin 0.0 - 1.2 mg/dL 0.4  0.5  0.4   Alkaline Phos 38 - 126 U/L 75  64  77   AST 15 - 41 U/L 16  16  17    ALT 0 - 44 U/L 7  9  9        RADIOGRAPHIC STUDIES: I have personally reviewed the radiological images as listed and agreed with the findings in the report. No results found.    Orders Placed This Encounter  Procedures   NM PET Image Restag (PS) Skull Base To Thigh    Standing Status:   Future    Expected Date:   03/04/2024    Expiration Date:   02/11/2025    If indicated for the ordered procedure, I authorize the administration of a radiopharmaceutical per Radiology protocol:   Yes    Is the patient pregnant?:   No    Preferred imaging  location?:   [facility]   All questions were answered.  The patient knows to call the clinic with any problems, questions or concerns. No barriers to learning was detected. The total time spent in the appointment was 25 minutes, including review of chart and various tests results, discussions about plan of care and coordination of care plan     Onita Mattock, MD 02/12/2024

## 2024-02-12 NOTE — Progress Notes (Signed)
 Order for Zarxio  300mcg PRN for ANC <1.5K entered in supportive care plan per Dr. Lanny. Pt to receive 1 dose this Saturday for ANC=1.3K today.  Petro Talent, PharmD, MBA

## 2024-02-13 ENCOUNTER — Other Ambulatory Visit: Payer: Self-pay

## 2024-02-13 LAB — T4: T4, Total: 8.9 ug/dL (ref 4.5–12.0)

## 2024-02-15 ENCOUNTER — Inpatient Hospital Stay

## 2024-02-15 VITALS — BP 120/82 | HR 105 | Temp 98.6°F | Resp 16

## 2024-02-15 DIAGNOSIS — Z95828 Presence of other vascular implants and grafts: Secondary | ICD-10-CM

## 2024-02-15 DIAGNOSIS — Z5112 Encounter for antineoplastic immunotherapy: Secondary | ICD-10-CM | POA: Diagnosis not present

## 2024-02-15 MED ORDER — FILGRASTIM-SNDZ 300 MCG/0.5ML IJ SOSY
300.0000 ug | PREFILLED_SYRINGE | Freq: Once | INTRAMUSCULAR | Status: AC
  Administered 2024-02-15: 300 ug via SUBCUTANEOUS
  Filled 2024-02-15: qty 0.5

## 2024-02-17 ENCOUNTER — Other Ambulatory Visit: Payer: Self-pay

## 2024-02-17 ENCOUNTER — Telehealth: Payer: Self-pay | Admitting: Hematology

## 2024-02-17 NOTE — Telephone Encounter (Signed)
 Called the patient and she is aware of her appt.

## 2024-02-19 ENCOUNTER — Inpatient Hospital Stay

## 2024-02-19 ENCOUNTER — Encounter: Payer: Self-pay | Admitting: Hematology

## 2024-02-19 ENCOUNTER — Inpatient Hospital Stay: Admitting: Hematology

## 2024-02-19 ENCOUNTER — Inpatient Hospital Stay: Admitting: Dietician

## 2024-02-19 VITALS — BP 122/78 | HR 96 | Temp 98.4°F | Resp 18 | Wt 154.5 lb

## 2024-02-19 DIAGNOSIS — C162 Malignant neoplasm of body of stomach: Secondary | ICD-10-CM

## 2024-02-19 DIAGNOSIS — Z5112 Encounter for antineoplastic immunotherapy: Secondary | ICD-10-CM | POA: Diagnosis not present

## 2024-02-19 LAB — CBC WITH DIFFERENTIAL (CANCER CENTER ONLY)
Abs Immature Granulocytes: 0.01 K/uL (ref 0.00–0.07)
Basophils Absolute: 0 K/uL (ref 0.0–0.1)
Basophils Relative: 1 %
Eosinophils Absolute: 0.1 K/uL (ref 0.0–0.5)
Eosinophils Relative: 3 %
HCT: 34.6 % — ABNORMAL LOW (ref 36.0–46.0)
Hemoglobin: 12.2 g/dL (ref 12.0–15.0)
Immature Granulocytes: 0 %
Lymphocytes Relative: 26 %
Lymphs Abs: 0.7 K/uL (ref 0.7–4.0)
MCH: 29.5 pg (ref 26.0–34.0)
MCHC: 35.3 g/dL (ref 30.0–36.0)
MCV: 83.8 fL (ref 80.0–100.0)
Monocytes Absolute: 0.3 K/uL (ref 0.1–1.0)
Monocytes Relative: 10 %
Neutro Abs: 1.6 K/uL — ABNORMAL LOW (ref 1.7–7.7)
Neutrophils Relative %: 60 %
Platelet Count: 383 K/uL (ref 150–400)
RBC: 4.13 MIL/uL (ref 3.87–5.11)
RDW: 15 % (ref 11.5–15.5)
WBC Count: 2.6 K/uL — ABNORMAL LOW (ref 4.0–10.5)
nRBC: 0 % (ref 0.0–0.2)

## 2024-02-19 LAB — CMP (CANCER CENTER ONLY)
ALT: 10 U/L (ref 0–44)
AST: 22 U/L (ref 15–41)
Albumin: 3.8 g/dL (ref 3.5–5.0)
Alkaline Phosphatase: 73 U/L (ref 38–126)
Anion gap: 5 (ref 5–15)
BUN: 11 mg/dL (ref 6–20)
CO2: 28 mmol/L (ref 22–32)
Calcium: 9.5 mg/dL (ref 8.9–10.3)
Chloride: 105 mmol/L (ref 98–111)
Creatinine: 0.51 mg/dL (ref 0.44–1.00)
GFR, Estimated: >60 mL/min (ref >60)
Glucose, Bld: 116 mg/dL — ABNORMAL HIGH (ref 70–99)
Potassium: 3.5 mmol/L (ref 3.5–5.1)
Sodium: 138 mmol/L (ref 135–145)
Total Bilirubin: 0.4 mg/dL (ref 0.0–1.2)
Total Protein: 6.3 g/dL — ABNORMAL LOW (ref 6.5–8.1)

## 2024-02-19 MED ORDER — FAMOTIDINE IN NACL 20-0.9 MG/50ML-% IV SOLN
20.0000 mg | Freq: Once | INTRAVENOUS | Status: AC
  Administered 2024-02-19: 20 mg via INTRAVENOUS
  Filled 2024-02-19: qty 50

## 2024-02-19 MED ORDER — SODIUM CHLORIDE 0.9 % IV SOLN
60.0000 mg/m2 | Freq: Once | INTRAVENOUS | Status: AC
  Administered 2024-02-19: 114 mg via INTRAVENOUS
  Filled 2024-02-19: qty 19

## 2024-02-19 MED ORDER — DEXAMETHASONE SOD PHOSPHATE PF 10 MG/ML IJ SOLN
10.0000 mg | Freq: Once | INTRAMUSCULAR | Status: AC
  Administered 2024-02-19: 10 mg via INTRAVENOUS

## 2024-02-19 MED ORDER — SODIUM CHLORIDE 0.9 % IV SOLN: INTRAVENOUS | Status: AC

## 2024-02-19 MED ORDER — DIPHENHYDRAMINE HCL 50 MG/ML IJ SOLN
25.0000 mg | Freq: Once | INTRAMUSCULAR | Status: AC
  Administered 2024-02-19: 25 mg via INTRAVENOUS
  Filled 2024-02-19: qty 1

## 2024-02-19 NOTE — Patient Instructions (Signed)
 [facility] - A DEPT OF [facility]  Discharge Instructions: Thank you for choosing [facility] to provide your oncology and hematology care.   If you have a lab appointment with the Cancer Center, please go directly to the Cancer Center and check in at the registration area.   Wear comfortable clothing and clothing appropriate for easy access to any Portacath or PICC line.   We strive to give you quality time with your provider. You may need to reschedule your appointment if you arrive late (15 or more minutes).  Arriving late affects you and other patients whose appointments are after yours.  Also, if you miss three or more appointments without notifying the office, you may be dismissed from the clinic at the provider's discretion.      For prescription refill requests, have your pharmacy contact our office and allow 72 hours for refills to be completed.    Today you received the following chemotherapy and/or immunotherapy agents paclitaxel       To help prevent nausea and vomiting after your treatment, we encourage you to take your nausea medication as directed.  BELOW ARE SYMPTOMS THAT SHOULD BE REPORTED IMMEDIATELY: *FEVER GREATER THAN 100.4 F (38 C) OR HIGHER *CHILLS OR SWEATING *NAUSEA AND VOMITING THAT IS NOT CONTROLLED WITH YOUR NAUSEA MEDICATION *UNUSUAL SHORTNESS OF BREATH *UNUSUAL BRUISING OR BLEEDING *URINARY PROBLEMS (pain or burning when urinating, or frequent urination) *BOWEL PROBLEMS (unusual diarrhea, constipation, pain near the anus) TENDERNESS IN MOUTH AND THROAT WITH OR WITHOUT PRESENCE OF ULCERS (sore throat, sores in mouth, or a toothache) UNUSUAL RASH, SWELLING OR PAIN  UNUSUAL VAGINAL DISCHARGE OR ITCHING   Items with * indicate a potential emergency and should be followed up as soon as possible or go to the Emergency Department if any problems should occur.  Please show the CHEMOTHERAPY ALERT CARD or IMMUNOTHERAPY  ALERT CARD at check-in to the Emergency Department and triage nurse.  Should you have questions after your visit or need to cancel or reschedule your appointment, please contact [facility] - A DEPT OF [facility]  Dept: [phone]  and follow the prompts.  Office hours are 8:00 a.m. to 4:30 p.m. Monday - Friday. Please note that voicemails left after 4:00 p.m. may not be returned until the following business day.  We are closed weekends and major holidays. You have access to a nurse at all times for urgent questions. Please call the main number to the clinic Dept: [phone] and follow the prompts.   For any non-urgent questions, you may also contact your provider using MyChart. We now offer e-Visits for anyone 60 and older to request care online for non-urgent symptoms. For details visit [email or web address].   Also download the MyChart app! Go to the app store, search MyChart, open the app, select [place], and log in with your MyChart username and password.

## 2024-02-23 ENCOUNTER — Other Ambulatory Visit: Payer: Self-pay | Admitting: Nurse Practitioner

## 2024-02-23 DIAGNOSIS — I10 Essential (primary) hypertension: Secondary | ICD-10-CM

## 2024-02-24 ENCOUNTER — Other Ambulatory Visit: Payer: Self-pay

## 2024-02-24 ENCOUNTER — Other Ambulatory Visit (HOSPITAL_COMMUNITY): Payer: Self-pay

## 2024-02-24 MED ORDER — AMLODIPINE BESYLATE 10 MG PO TABS
10.0000 mg | ORAL_TABLET | Freq: Every day | ORAL | 0 refills | Status: DC
  Filled 2024-02-24: qty 90, 90d supply, fill #0

## 2024-02-25 ENCOUNTER — Other Ambulatory Visit (HOSPITAL_COMMUNITY): Payer: Self-pay

## 2024-02-25 ENCOUNTER — Other Ambulatory Visit: Payer: Self-pay

## 2024-02-25 NOTE — Assessment & Plan Note (Signed)
 [id number] with peritoneal metastasis. 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   -she started first line chemo FLOT on 05/09/22 -She understands that chemotherapy is palliative, to prolong her life.  We are unlikely going to cure her cancer. -PD-L1 0-1%, very limited benefit from PD-L1 immunotherapy, FO revealed FGFR2 amplification and fusion (+), FGFR inhibitors can be considered in future, no other targeted therapy available  -I changed her chemo from FLOT to FOLFOX on 06/20/2022 -She is not able to return to work due to the cancer and treatment related symptoms.  -I subsequently changed her treatment to maintenance Xeloda  in early August 2024, she is tolerating well overall  -her NGS Caris showed positive Claudin 18.2, she is a candidate for zolbetuximab.  -Repeated EGD on March 21, 2023 showed residual gastric cancer.  We discussed option of changing her chemotherapy back to FOLFOX and add zolbetuximab.  -PET 06/03/2023 showed stable disease (no hypermetabolic disease outside stomach) -Patient developed recurrent abdominal pain, similar to the symptoms she had when she was diagnosed.  She agreed to change treatment back to FOLFOX, and add Zolbetuximab. She started on 07/09/2023. She tolerated first cycle poorly and had prolonged recovery. Oxaliplatin  was stop after cycle 1 due to poor tolerance. She continued 5-fu/LV and zolbe every 2 weeks  -CT 10/10/2023 showed stable disease - Unfortunately PET scan on December 06, 2023 showed worsening peritoneal metastasis.  I recommend change treatment to paclitaxel  and ramucirumab , she started on 12/17/23

## 2024-02-26 ENCOUNTER — Inpatient Hospital Stay

## 2024-02-26 ENCOUNTER — Inpatient Hospital Stay (HOSPITAL_BASED_OUTPATIENT_CLINIC_OR_DEPARTMENT_OTHER): Admitting: Hematology

## 2024-02-26 ENCOUNTER — Other Ambulatory Visit (HOSPITAL_COMMUNITY): Payer: Self-pay

## 2024-02-26 ENCOUNTER — Other Ambulatory Visit

## 2024-02-26 ENCOUNTER — Other Ambulatory Visit: Payer: Self-pay

## 2024-02-26 ENCOUNTER — Ambulatory Visit

## 2024-02-26 ENCOUNTER — Ambulatory Visit: Admitting: Hematology

## 2024-02-26 ENCOUNTER — Inpatient Hospital Stay: Admitting: Dietician

## 2024-02-26 VITALS — BP 100/68 | HR 95 | Temp 98.6°F | Resp 16 | Ht 68.0 in | Wt 154.0 lb

## 2024-02-26 DIAGNOSIS — C162 Malignant neoplasm of body of stomach: Secondary | ICD-10-CM | POA: Diagnosis not present

## 2024-02-26 DIAGNOSIS — Z5112 Encounter for antineoplastic immunotherapy: Secondary | ICD-10-CM | POA: Diagnosis not present

## 2024-02-26 DIAGNOSIS — R1013 Epigastric pain: Secondary | ICD-10-CM

## 2024-02-26 DIAGNOSIS — Z95828 Presence of other vascular implants and grafts: Secondary | ICD-10-CM

## 2024-02-26 LAB — CMP (CANCER CENTER ONLY)
ALT: 9 U/L (ref 0–44)
AST: 17 U/L (ref 15–41)
Albumin: 3.8 g/dL (ref 3.5–5.0)
Alkaline Phosphatase: 57 U/L (ref 38–126)
Anion gap: 6 (ref 5–15)
BUN: 8 mg/dL (ref 6–20)
CO2: 26 mmol/L (ref 22–32)
Calcium: 9.1 mg/dL (ref 8.9–10.3)
Chloride: 104 mmol/L (ref 98–111)
Creatinine: 0.48 mg/dL (ref 0.44–1.00)
GFR, Estimated: >60 mL/min (ref >60)
Glucose, Bld: 113 mg/dL — ABNORMAL HIGH (ref 70–99)
Potassium: 3.2 mmol/L — ABNORMAL LOW (ref 3.5–5.1)
Sodium: 136 mmol/L (ref 135–145)
Total Bilirubin: 0.5 mg/dL (ref 0.0–1.2)
Total Protein: 6.4 g/dL — ABNORMAL LOW (ref 6.5–8.1)

## 2024-02-26 LAB — CBC WITH DIFFERENTIAL (CANCER CENTER ONLY)
Abs Immature Granulocytes: 0.01 K/uL (ref 0.00–0.07)
Basophils Absolute: 0 K/uL (ref 0.0–0.1)
Basophils Relative: 1 %
Eosinophils Absolute: 0.1 K/uL (ref 0.0–0.5)
Eosinophils Relative: 2 %
HCT: 33.6 % — ABNORMAL LOW (ref 36.0–46.0)
Hemoglobin: 11.7 g/dL — ABNORMAL LOW (ref 12.0–15.0)
Immature Granulocytes: 0 %
Lymphocytes Relative: 30 %
Lymphs Abs: 0.8 K/uL (ref 0.7–4.0)
MCH: 29 pg (ref 26.0–34.0)
MCHC: 34.8 g/dL (ref 30.0–36.0)
MCV: 83.4 fL (ref 80.0–100.0)
Monocytes Absolute: 0.2 K/uL (ref 0.1–1.0)
Monocytes Relative: 7 %
Neutro Abs: 1.5 K/uL — ABNORMAL LOW (ref 1.7–7.7)
Neutrophils Relative %: 60 %
Platelet Count: 360 K/uL (ref 150–400)
RBC: 4.03 MIL/uL (ref 3.87–5.11)
RDW: 15.5 % (ref 11.5–15.5)
WBC Count: 2.5 K/uL — ABNORMAL LOW (ref 4.0–10.5)
nRBC: 0 % (ref 0.0–0.2)

## 2024-02-26 MED ORDER — FAMOTIDINE IN NACL 20-0.9 MG/50ML-% IV SOLN
20.0000 mg | Freq: Once | INTRAVENOUS | Status: AC
  Administered 2024-02-26: 20 mg via INTRAVENOUS
  Filled 2024-02-26: qty 50

## 2024-02-26 MED ORDER — HYDROCODONE-ACETAMINOPHEN 5-325 MG PO TABS
1.0000 | ORAL_TABLET | Freq: Two times a day (BID) | ORAL | 0 refills | Status: DC | PRN
  Filled 2024-02-26: qty 60, 30d supply, fill #0

## 2024-02-26 MED ORDER — DIPHENHYDRAMINE HCL 50 MG/ML IJ SOLN
25.0000 mg | Freq: Once | INTRAMUSCULAR | Status: AC
  Administered 2024-02-26: 25 mg via INTRAVENOUS
  Filled 2024-02-26: qty 1

## 2024-02-26 MED ORDER — SODIUM CHLORIDE 0.9 % IV SOLN: INTRAVENOUS | Status: AC

## 2024-02-26 MED ORDER — ACETAMINOPHEN 325 MG PO TABS: 650.0000 mg | ORAL_TABLET | Freq: Once | ORAL | Status: AC

## 2024-02-26 MED ORDER — DEXAMETHASONE SOD PHOSPHATE PF 10 MG/ML IJ SOLN
10.0000 mg | Freq: Once | INTRAMUSCULAR | Status: AC
  Administered 2024-02-26: 10 mg via INTRAVENOUS

## 2024-02-26 MED ORDER — PROMETHAZINE HCL 25 MG PO TABS
50.0000 mg | ORAL_TABLET | Freq: Two times a day (BID) | ORAL | 1 refills | Status: AC | PRN
  Filled 2024-02-26: qty 90, 23d supply, fill #0
  Filled 2024-04-15: qty 90, 23d supply, fill #1

## 2024-02-26 MED ORDER — SODIUM CHLORIDE 0.9 % IV SOLN: Freq: Once | INTRAVENOUS | Status: AC

## 2024-02-26 MED ORDER — SODIUM CHLORIDE 0.9 % IV SOLN
60.0000 mg/m2 | Freq: Once | INTRAVENOUS | Status: AC
  Administered 2024-02-26: 114 mg via INTRAVENOUS
  Filled 2024-02-26: qty 19

## 2024-02-26 MED ORDER — ACETAMINOPHEN 325 MG PO TABS
650.0000 mg | ORAL_TABLET | Freq: Once | ORAL | Status: AC
  Administered 2024-02-26: 650 mg via ORAL
  Filled 2024-02-26: qty 2

## 2024-02-26 MED ORDER — SUCRALFATE 1 G PO TABS
1.0000 g | ORAL_TABLET | Freq: Four times a day (QID) | ORAL | 1 refills | Status: DC
  Filled 2024-02-26 (×2): qty 120, 30d supply, fill #0
  Filled 2024-04-15: qty 120, 30d supply, fill #1

## 2024-02-26 MED ORDER — FAMOTIDINE 20 MG PO TABS
20.0000 mg | ORAL_TABLET | Freq: Two times a day (BID) | ORAL | 2 refills | Status: AC
  Filled 2024-03-27 – 2024-03-28 (×2): qty 60, 30d supply, fill #0
  Filled 2024-04-27: qty 60, 30d supply, fill #1
  Filled 2024-06-02: qty 60, 30d supply, fill #2

## 2024-02-26 NOTE — Progress Notes (Signed)
 Patient experienced some bloody emesis around Monday this week. Dr. Lanny came to chairside to assess patient. Per Dr. Lanny, hold Cyramza  but OK to give Taxol  with ANC 1.5. Patient will also receive 1L-NS over 2 hours

## 2024-02-26 NOTE — Progress Notes (Signed)
 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   -she started first line chemo FLOT on 05/09/22 -She understands that chemotherapy is palliative, to prolong her life.  We are unlikely going to cure her cancer. -PD-L1 0-1%, very limited benefit from PD-L1 immunotherapy, FO revealed FGFR2 amplification and fusion (+), FGFR inhibitors can be considered in future, no other targeted therapy available  -I changed her chemo from FLOT to FOLFOX on 06/20/2022 -She is not able to return to work due to the cancer and treatment related symptoms.  -I subsequently changed her treatment to maintenance Xeloda  in early August 2024, she is tolerating well overall  -her NGS Caris showed positive Claudin 18.2, she is a candidate for zolbetuximab.  -Repeated EGD on March 21, 2023 showed residual gastric cancer.  We discussed option of changing her chemotherapy back to FOLFOX and add zolbetuximab.  -PET 06/03/2023 showed stable disease (no hypermetabolic disease outside stomach) -Patient developed recurrent abdominal pain, similar to the symptoms she had when she was diagnosed.  She agreed to change treatment back to FOLFOX, and add Zolbetuximab. She started on 07/09/2023. She tolerated first cycle poorly and had prolonged recovery. Oxaliplatin  was stop after cycle 1 due to poor tolerance. She continued  5-fu/LV and zolbe every 2 weeks  -CT 10/10/2023 showed stable disease - Unfortunately PET scan on December 06, 2023 showed worsening peritoneal metastasis.  I recommend change treatment to paclitaxel  and ramucirumab , she started on 12/17/23  Assessment & Plan Metastatic gastric cancer on chemotherapy with recent gastrointestinal bleeding Metastatic gastric cancer with recent gastrointestinal bleeding, likely related to ramucirumab . Hemoglobin levels are stable, indicating no acute bleeding. Ramucirumab  is held due to bleeding risk. - Administered IV fluids for two hours - Proceeded with chemotherapy excluding ramucirumab  - Ordered scan for next week - Monitor for signs of bleeding and will consider endoscopy if recurrent bleeding occurs  Constipation due to opioid use and cancer Constipation likely due to opioid use and cancer, contributing to abdominal discomfort. - Recommended Miralax for constipation management  Abdominal pain related to gastric cancer and constipation Abdominal pain likely due to gastric cancer and constipation. Pain management is necessary. - Administered Tylenol  for pain management - Refilled hydrocodone  prescription  Dehydration managed with IV fluids Dehydration likely due to inadequate oral intake and recent gastrointestinal symptoms. - Administered IV fluids for two hours  Essential hypertension, stable on amlodipine  Essential hypertension is well-controlled on amlodipine  with recent blood pressure reading of 100/6 mmHg. - Continue current amlodipine  regimen  Gastroesophageal reflux disease, on famotidine  Gastroesophageal reflux disease managed with famotidine . She is out of medication. - Refilled famotidine  prescription  Plan - Lab reviewed, adequate for treatment, will proceed with paclitaxel  at the same reduced dose, will hold ramucirumab  today due to her recent mild upper GI bleeding - Will give IV fluids 1 L over 2 hours today, and Tylenol  for her  epigastric pain today - Scheduled for staging PET scan next week -  I reviewed her medications - Follow-up in 2 weeks   SUMMARY OF ONCOLOGIC HISTORY: Oncology History Overview Note   Cancer Staging  Gastric cancer [facility]) Staging form: Stomach, AJCC 8th Edition - Clinical stage from 04/19/2022: Stage IVB (cT2, cN0, pM1) - Signed by [name], MD on 05/08/2022 Total positive nodes: 0     Gastric cancer (HCC)  03/30/2022 Procedure   EGD:  Impression:  - Normal esophagus. - A few gastric polyps. Biopsied. - Gastritis. Biopsied. - Non-bleeding gastric ulcer with no stigmata of bleeding. Biopsied. - Normal examined duodenum. Biopsied.  Findings: Diffuse moderate inflammation characterized by congestion (edema), friability and granularity was found in the cardia, in the gastric fundus and in the gastric body. There were associated erosions in multiple places. Biopsies were taken from the antrum, body, and fundus with a cold forceps for histology. Estimated blood loss was minimal.  One non-bleeding cratered gastric ulcer with no stigmata of bleeding was found on the greater curvature of the stomach. The lesion was 6 mm in largest dimension. The mucosa around the ulcer was heaped and led to some deformity in the antrum. Biopsies were taken with a cold forceps for histology. Estimated blood loss was minimal.    03/30/2022 Pathology Results   Patient: [name], [name]  Diagnosis 1. Surgical [P], duodenal - BENIGN SMALL BOWEL MUCOSA WITH NO SIGNIFICANT PATHOLOGIC CHANGES 2. Surgical [P], gastric antrum - GASTRIC ANTRAL MUCOSA WITH FEATURES OF REACTIVE GASTROPATHY - NEGATIVE FOR H. PYLORI ON H&E STAIN - NEGATIVE FOR INTESTINAL METAPLASIA OR MALIGNANCY 3. Surgical [P], gastric body - GASTRIC OXYNTIC MUCOSA WITH REACTIVE/REPARATIVE CHANGES - NEGATIVE FOR H. PYLORI ON H&E STAIN - NEGATIVE FOR INTESTINAL METAPLASIA, DYSPLASIA OR MALIGNANCY 4. Surgical [P], greater  curve ulceration - ADENOCARCINOMA WITH SIGNET RING CELL FEATURES (SEE NOTE) 5. Surgical [P], gastric polyps - ADENOCARCINOMA WITH SIGNET RING CELL FEATURES (SEE NOTE) 6. Surgical [P], fundus (gastric) - ADENOCARCINOMA WITH SIGNET RING CELL FEATURES (SEE NOTE) 7. Surgical [P], colon, ascending, polyp (1) - TUBULAR ADENOMA. - NO HIGH GRADE DYSPLASIA OR MALIGNANCY. 8. Surgical [P], colon, transverse, polyp (1) - TUBULAR ADENOMA. - NO HIGH GRADE DYSPLASIA OR MALIGNANCY.    04/13/2022 Initial Diagnosis   Gastric cancer (HCC)   04/19/2022 Cancer Staging   Staging form: Stomach, AJCC 8th Edition - Clinical stage from 04/19/2022: Stage IVB (cT2, cN0, pM1) - Signed by [name], MD on 05/08/2022 Total positive nodes: 0   05/05/2022 Genetic Testing   Negative genetic testing on the Multi-cancer gene panel + RNA.  FH c.259C>T VUS identified.  The report date is May 05, 2022.  The Multi-Cancer + RNA Panel offered by [facility] includes sequencing and/or deletion/duplication analysis of the following 70 genes:  AIP*, ALK, APC*, ATM*, AXIN2*, BAP1*, BARD1*, BLM*, BMPR1A*, BRCA1*, BRCA2*, BRIP1*, CDC73*, CDH1*, CDK4, CDKN1B*, CDKN2A, CHEK2*, CTNNA1*, DICER1*, EPCAM (del/dup only), EGFR, FH*, FLCN*, GREM1 (promoter dup only), HOXB13, KIT, LZTR1, MAX*, MBD4, MEN1*, MET, MITF, MLH1*, MSH2*, MSH3*, MSH6*, MUTYH*, NF1*, NF2*, NTHL1*, PALB2*, PDGFRA, PMS2*, POLD1*, POLE*, POT1*, PRKAR1A*, PTCH1*, PTEN*, RAD51C*, RAD51D*, RB1*, RET, SDHA* (sequencing only), SDHAF2*, SDHB*, SDHC*, SDHD*, SMAD4*, SMARCA4*, SMARCB1*, SMARCE1*, STK11*, SUFU*, TMEM127*, TP53*, TSC1*, TSC2*, VHL*. RNA analysis is performed for * genes.    05/09/2022 - 06/07/2022 Chemotherapy   Patient is on Treatment Plan : GASTROESOPHAGEAL FLOT q14d X 4 cycles      Miscellaneous   Foundation One  Biomarker Findings Microsatellite status- Cannot be determined Tumor Mutational Burden- Cannot be determined  Genomic Findings  FGFR2  amplification,FGFR2-TACC2 fusion,  Rearrangement intron 17 ARAF amplification CCND3 amplification TP53 V231fs*74     05/30/2022 Imaging    IMPRESSION: 1. Mild hypermetabolism corresponding to a dominant left upper quadrant mass and smaller perigastric nodules or nodes. 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 Recommend attention to the dominant left upper quadrant soft tissue mass on follow-up exams to exclude unlikely recurrent lymphoma. 2. No gastric hypermetabolism and no typical findings of metastatic disease.   06/20/2022 - 11/16/2022 Chemotherapy   Patient is on Treatment Plan : GASTRIC FOLFOX q14d x 12 cycles     08/27/2022 Imaging    IMPRESSION: No focal gastric mass on CT.   No findings suspicious for recurrent or metastatic disease.   Stable left upper abdominal soft tissue lesion and small lymph nodes, chronic, favoring treated lymphoma.   11/27/2022 Imaging    IMPRESSION: 1. Questionable thickening of the distal esophagus/GE junction and gastric antrum, consider further evaluation with endoscopy. 2. Chronically stable left upper quadrant nodularity and prominent lymph nodes again favored treated lymphoma. Continued attention on follow-up imaging suggested. 3. No convincing evidence of metastatic disease in the chest, abdomen or pelvis. 4. Questionable asymmetric wall thickening of the rectum, consider further evaluation with colonoscopy. 5. Mild wall thickening of a nondistended urinary bladder, correlate with urinalysis to exclude cystitis. 6. Hepatic steatosis.   07/10/2023 - 11/27/2023 Chemotherapy   Patient is on Treatment Plan : GASTROESOPHAGEAL Zolbetuximab (800/400) + FOLFOX D1,15,29 q42d x 4 cycles / Zolbetuximab (400) + 5FU + Leucovorin  D1,15,29 q42d     12/17/2023 -  Chemotherapy   Patient is on Treatment Plan : GASTRIC/GE JUNCTION PACLitaxel  D1,8,15 + Ramucirumab  D1,15 q28d        Discussed the use of AI  scribe software for clinical note transcription with the patient, who gave verbal consent to proceed.  History of Present Illness Sheryl [name] is a 58 year old female with metastatic gastric cancer who presents for follow-up.  She experiences lightheadedness and dehydration, which began today. She had difficulty with blood draws this morning, requiring her to be laid back for the procedure. She did not eat breakfast but drank a little Glycerin. She is able to drink tea and water.  Stomach pain is described as 'contracting and needing a release.' She is running low on hydrocodone , which she takes once a night for pain. She uses Phenergan  at night to help relax and is out of famotidine  for acid reflux. She currently takes sacrofate twice a day before meals.  She experienced vomiting with blood on Saturday, but on Sunday, she vomited without blood. She has not vomited since. Constipation is present, with no bowel movement since Monday, two days ago. She experiences stomach discomfort, which she attributes to constipation and hydrocodone  use. She reports tenderness in the stomach area, but no bloating. She is passing gas and confirms constipation.     All other systems were reviewed with the patient and are negative.  MEDICAL HISTORY:  Past Medical History:  Diagnosis Date   Blood transfusion without reported diagnosis    had transfusion with hysterectomy   Cataract    Colon polyps 2012   Diabetes (HCC) 03/13/2021   Diabetes (HCC) 05/21/2019   Family history of breast cancer    Family history of pancreatic cancer    Family history of stomach cancer    Fibroid    gastric ca 03/2022   GERD (gastroesophageal reflux disease)  H/O blood clots    History of hysterectomy    fibroids and heavy cycles   Hypertension     SURGICAL HISTORY: Past Surgical History:  Procedure Laterality Date   ABDOMINAL HYSTERECTOMY     BIOPSY  04/19/2022   Procedure: BIOPSY;  Surgeon: Wilhelmenia  Aloha Raddle., MD;  Location: [facility];  Service: Gastroenterology;;   BIOPSY  03/21/2023   Procedure: BIOPSY;  Surgeon: Wilhelmenia Aloha Raddle., MD;  Location: [facility];  Service: Gastroenterology;;   COLONOSCOPY     ESOPHAGOGASTRODUODENOSCOPY (EGD) WITH PROPOFOL  N/A 04/19/2022   Procedure: ESOPHAGOGASTRODUODENOSCOPY (EGD) WITH PROPOFOL ;  Surgeon: Wilhelmenia Aloha Raddle., MD;  Location: [facility];  Service: Gastroenterology;  Laterality: N/A;   ESOPHAGOGASTRODUODENOSCOPY (EGD) WITH PROPOFOL  N/A 03/21/2023   Procedure: ESOPHAGOGASTRODUODENOSCOPY (EGD) WITH PROPOFOL ;  Surgeon: Wilhelmenia Aloha Raddle., MD;  Location: [facility];  Service: Gastroenterology;  Laterality: N/A;   EUS N/A 04/19/2022   Procedure: UPPER ENDOSCOPIC ULTRASOUND (EUS) RADIAL;  Surgeon: Wilhelmenia Aloha Raddle., MD;  Location: [facility];  Service: Gastroenterology;  Laterality: N/A;   EXCISION OF SKIN TAG  05/03/2022   Procedure: EXCISION OF CHEST WALL SKIN LESION;  Surgeon: Dasie Leonor CROME, MD;  Location: [facility];  Service: General;;   LAPAROSCOPY N/A 05/03/2022   Procedure: LAPAROSCOPY DIAGNOSTIC WITH PERITONEAL WASHINGS;  Surgeon: Dasie Leonor CROME, MD;  Location: [facility];  Service: General;  Laterality: N/A;   POLYPECTOMY  04/19/2022   Procedure: POLYPECTOMY;  Surgeon: Wilhelmenia Aloha Raddle., MD;  Location: [facility];  Service: Gastroenterology;;   PORTACATH PLACEMENT N/A 05/03/2022   Procedure: INSERTION PORT-A-CATH WITH ULTRASOUND GUIDANCE;  Surgeon: Dasie Leonor CROME, MD;  Location: [facility];  Service: General;  Laterality: N/A;   UPPER GASTROINTESTINAL ENDOSCOPY      I have reviewed the social history and family history with the patient and they are unchanged from previous note.  ALLERGIES:  is allergic to aspirin, cyclobenzaprine, naproxen sodium, zithromax [azithromycin dihydrate], oxaliplatin , and dilaudid [hydromorphone].  MEDICATIONS:  Current Outpatient Medications  Medication Sig Dispense Refill    acetaminophen  (TYLENOL ) 500 MG tablet Take 2 tablets (1,000 mg total) by mouth every 8 (eight) hours as needed (pain). 30 tablet 1   amLODipine  (NORVASC ) 10 MG tablet Take 1 tablet (10 mg total) by mouth daily. 90 tablet 0   b complex vitamins capsule Take 1 capsule by mouth daily.     calcium -vitamin D (OSCAL WITH D) 500-5 MG-MCG tablet Take 2 tablets by mouth 2 (two) times daily.     famotidine  (PEPCID ) 20 MG tablet Take 1 tablet (20 mg total) by mouth 2 (two) times daily. 60 tablet 2   HYDROcodone -acetaminophen  (NORCO/VICODIN) 5-325 MG tablet Take 1 tablet by mouth every 12 (twelve) hours as needed for moderate pain (pain score 4-6). 60 tablet 0   lidocaine -prilocaine  (EMLA ) cream Apply 1 Application topically as needed. 30 g 1   metoCLOPramide  (REGLAN ) 10 MG tablet Take 1 tablet (10 mg total) by mouth every 8 (eight) hours as needed for nausea. 60 tablet 1   pantoprazole  (PROTONIX ) 40 MG tablet Take 1 tablet (40 mg total) by mouth daily. 30 tablet 2   potassium chloride  (KLOR-CON  M) 10 MEQ tablet Take 1 tablet (10 mEq total) by mouth 2 (two) times daily. 60 tablet 1   prochlorperazine  (COMPAZINE ) 10 MG tablet Take 1 tablet (10 mg total) by mouth every 6 (six) hours as needed for nausea or vomiting. 30 tablet 2   promethazine  (PHENERGAN ) 25 MG tablet Take 2 tablets (50 mg total)  by mouth 2 (two) times daily as needed for nausea or vomiting. 90 tablet 1   scopolamine  (TRANSDERM-SCOP) 1 MG/3DAYS Place 1 patch (1.5 mg total) onto the skin every 3 (three) days. 10 patch 1   sucralfate  (CARAFATE ) 1 g tablet Take 1 tablet (1 g total) by mouth 4 (four) times daily. 120 tablet 1   zinc gluconate 50 MG tablet Take 50 mg by mouth daily.     No current facility-administered medications for this visit.   Facility-Administered Medications Ordered in Other Visits  Medication Dose Route Frequency Provider Last Rate Last Admin   0.9 %  sodium chloride  infusion   Intravenous Continuous [name], MD        acetaminophen  (TYLENOL ) tablet 650 mg  650 mg Oral Once [name], MD       PACLitaxel  (TAXOL ) 114 mg in sodium chloride  0.9 % 250 mL chemo infusion (</= 80mg /m2)  60 mg/m2 (Treatment Plan Recorded) Intravenous Once [name], MD        PHYSICAL EXAMINATION: ECOG PERFORMANCE STATUS: 2 - Symptomatic, <50% confined to bed  There were no vitals filed for this visit. Wt Readings from Last 3 Encounters:  02/26/24 154 lb (69.9 kg)  02/19/24 154 lb 8 oz (70.1 kg)  02/12/24 157 lb (71.2 kg)     GENERAL:alert, no distress and comfortable SKIN: skin color, texture, turgor are normal, no rashes or significant lesions EYES: normal, Conjunctiva are pink and non-injected, sclera clear NECK: supple, thyroid  normal size, non-tender, without nodularity LYMPH:  no palpable lymphadenopathy in the cervical, axillary  LUNGS: clear to auscultation and percussion with normal breathing effort HEART: regular rate & rhythm and no murmurs and no lower extremity edema ABDOMEN:abdomen soft, with mild upper abdominal tenderness and normal bowel sounds Musculoskeletal:no cyanosis of digits and no clubbing  NEURO: alert & oriented x 3 with fluent speech, no focal motor/sensory deficits  Physical Exam   LABORATORY DATA:  I have reviewed the data as listed    Latest Ref Rng & Units 02/26/2024    8:46 AM 02/19/2024    7:25 AM 02/12/2024   10:16 AM  CBC  WBC 4.0 - 10.5 K/uL 2.5  2.6  2.6   Hemoglobin 12.0 - 15.0 g/dL 88.2  87.7  88.0   Hematocrit 36.0 - 46.0 % 33.6  34.6  34.4   Platelets 150 - 400 K/uL 360  383  427         Latest Ref Rng & Units 02/26/2024    8:46 AM 02/19/2024    7:25 AM 02/12/2024   10:16 AM  CMP  Glucose 70 - 99 mg/dL 886  883  896   BUN 6 - 20 mg/dL 8  11  10    Creatinine 0.44 - 1.00 mg/dL 9.51  9.48  9.48   Sodium 135 - 145 mmol/L 136  138  138   Potassium 3.5 - 5.1 mmol/L 3.2  3.5  3.7   Chloride 98 - 111 mmol/L 104  105  104   CO2 22 - 32 mmol/L 26  28  29    Calcium  8.9  - 10.3 mg/dL 9.1  9.5  9.5   Total Protein 6.5 - 8.1 g/dL 6.4  6.3  6.3   Total Bilirubin 0.0 - 1.2 mg/dL 0.5  0.4  0.4   Alkaline Phos 38 - 126 U/L 57  73  75   AST 15 - 41 U/L 17  22  16    ALT 0 - 44 U/L  9  10  7        RADIOGRAPHIC STUDIES: I have personally reviewed the radiological images as listed and agreed with the findings in the report. No results found.    No orders of the defined types were placed in this encounter.  All questions were answered. The patient knows to call the clinic with any problems, questions or concerns. No barriers to learning was detected. The total time spent in the appointment was 30 minutes, including review of chart and various tests results, discussions about plan of care and coordination of care plan     Onita Mattock, MD 02/26/2024

## 2024-02-26 NOTE — Patient Instructions (Signed)
 [facility] - A DEPT OF [facility]   Discharge Instructions: Thank you for choosing [facility] to provide your oncology and hematology care.   If you have a lab appointment with the Cancer Center, please go directly to the Cancer Center and check in at the registration area.   Wear comfortable clothing and clothing appropriate for easy access to any Portacath or PICC line.   We strive to give you quality time with your provider. You may need to reschedule your appointment if you arrive late (15 or more minutes).  Arriving late affects you and other patients whose appointments are after yours.  Also, if you miss three or more appointments without notifying the office, you may be dismissed from the clinic at the provider's discretion.      For prescription refill requests, have your pharmacy contact our office and allow 72 hours for refills to be completed.    Today you received the following chemotherapy and/or immunotherapy agents: Paclitaxel (Taxol)      To help prevent nausea and vomiting after your treatment, we encourage you to take your nausea medication as directed.  BELOW ARE SYMPTOMS THAT SHOULD BE REPORTED IMMEDIATELY: *FEVER GREATER THAN 100.4 F (38 C) OR HIGHER *CHILLS OR SWEATING *NAUSEA AND VOMITING THAT IS NOT CONTROLLED WITH YOUR NAUSEA MEDICATION *UNUSUAL SHORTNESS OF BREATH *UNUSUAL BRUISING OR BLEEDING *URINARY PROBLEMS (pain or burning when urinating, or frequent urination) *BOWEL PROBLEMS (unusual diarrhea, constipation, pain near the anus) TENDERNESS IN MOUTH AND THROAT WITH OR WITHOUT PRESENCE OF ULCERS (sore throat, sores in mouth, or a toothache) UNUSUAL RASH, SWELLING OR PAIN  UNUSUAL VAGINAL DISCHARGE OR ITCHING   Items with * indicate a potential emergency and should be followed up as soon as possible or go to the Emergency Department if any problems should occur.  Please show the CHEMOTHERAPY ALERT CARD or  IMMUNOTHERAPY ALERT CARD at check-in to the Emergency Department and triage nurse.  Should you have questions after your visit or need to cancel or reschedule your appointment, please contact [facility] - A DEPT OF [facility]  Dept: [phone]  and follow the prompts.  Office hours are 8:00 a.m. to 4:30 p.m. Monday - Friday. Please note that voicemails left after 4:00 p.m. may not be returned until the following business day.  We are closed weekends and major holidays. You have access to a nurse at all times for urgent questions. Please call the main number to the clinic Dept: [phone] and follow the prompts.   For any non-urgent questions, you may also contact your provider using MyChart. We now offer e-Visits for anyone 76 and older to request care online for non-urgent symptoms. For details visit [email or web address].   Also download the MyChart app! Go to the app store, search "MyChart", open the app, select [place], and log in with your MyChart username and password.

## 2024-02-26 NOTE — Progress Notes (Signed)
 Nutrition Follow-up:  Patient with gastric cancer. She is currently receiving reduced dose Zolbetuximab/5FU + Leucovorin  q42d (start 07/09/23). Regimen discontinued 8/13 due to progression. Currently receiving dose reduced paclitaxel  + ramucirumab . Patient is under the care of Dr. Lanny.   Met with patient in infusion. Treatment held. Patient receiving IVF today. She is complaining of back pain at visit which is apparent. Having a hard time getting comfortable. Patient politely declines additional pillows or blankets.   Appetite is poor. Has been eating on small portion of broiled fish and greens the last couple days. She reports increased stomach pain. Patient reports a burning pain from cancer. 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 She also experiences a cramping pain when hungry which resolves with intake (single peanut butter cracker). She is taking carafate  before meals which works well sometimes.   Patient reports one episode of vomiting blood on Saturday. Likely related to treatment which was discussed with MD today (per pt). She also had episode of vomiting on Sunday. Denies hematemesis. Patient reports mild constipation. She had a hard BM on Thursday. Patient had normal BM on Monday.    Medications: reviewed   Labs: K 3.2, glucose 113  Anthropometrics: Wt 154 lb today   10/22 - 154 lb 8 oz 10/1 - 158 lb 4.8 oz 9/24 - 158 lb  9/17 - 159 lb 1.6 oz  9/3 - 161 lb 8 oz    NUTRITION DIAGNOSIS: Unintended wt loss - ongoing   INTERVENTION:  Encourage trying juice type supplements  Encourage bites/sips q2h Continue carafate  30 minutes prior to po Suggested trying half dose of pain meds during the day  Encourage allowing family to assist caring for elderly mother  PET scheduled for 11/3 noted  Support and encouragement     MONITORING, EVALUATION,  GOAL: wt trends, intake    NEXT VISIT: Wednesday November 12 during infusion

## 2024-03-02 ENCOUNTER — Telehealth: Payer: Self-pay | Admitting: Hematology

## 2024-03-02 ENCOUNTER — Encounter (HOSPITAL_COMMUNITY)
Admission: RE | Admit: 2024-03-02 | Discharge: 2024-03-02 | Disposition: A | Discharge status: Home / Self Care | Source: Ambulatory Visit | Attending: Hematology | Admitting: Hematology

## 2024-03-02 ENCOUNTER — Other Ambulatory Visit: Payer: Self-pay

## 2024-03-02 ENCOUNTER — Inpatient Hospital Stay: Attending: Physician Assistant

## 2024-03-02 ENCOUNTER — Encounter: Payer: Self-pay | Admitting: Radiology

## 2024-03-02 VITALS — BP 124/82 | HR 97 | Temp 99.4°F | Resp 16

## 2024-03-02 DIAGNOSIS — E876 Hypokalemia: Secondary | ICD-10-CM | POA: Insufficient documentation

## 2024-03-02 DIAGNOSIS — C169 Malignant neoplasm of stomach, unspecified: Secondary | ICD-10-CM | POA: Insufficient documentation

## 2024-03-02 DIAGNOSIS — Z95828 Presence of other vascular implants and grafts: Secondary | ICD-10-CM

## 2024-03-02 DIAGNOSIS — C162 Malignant neoplasm of body of stomach: Secondary | ICD-10-CM | POA: Insufficient documentation

## 2024-03-02 DIAGNOSIS — R110 Nausea: Secondary | ICD-10-CM | POA: Diagnosis not present

## 2024-03-02 DIAGNOSIS — D701 Agranulocytosis secondary to cancer chemotherapy: Secondary | ICD-10-CM | POA: Insufficient documentation

## 2024-03-02 DIAGNOSIS — R1013 Epigastric pain: Secondary | ICD-10-CM

## 2024-03-02 DIAGNOSIS — Z79633 Long term (current) use of mitotic inhibitor: Secondary | ICD-10-CM | POA: Insufficient documentation

## 2024-03-02 DIAGNOSIS — Z5111 Encounter for antineoplastic chemotherapy: Secondary | ICD-10-CM | POA: Diagnosis present

## 2024-03-02 DIAGNOSIS — C786 Secondary malignant neoplasm of retroperitoneum and peritoneum: Secondary | ICD-10-CM | POA: Insufficient documentation

## 2024-03-02 DIAGNOSIS — T451X5A Adverse effect of antineoplastic and immunosuppressive drugs, initial encounter: Secondary | ICD-10-CM | POA: Diagnosis not present

## 2024-03-02 DIAGNOSIS — Z5112 Encounter for antineoplastic immunotherapy: Secondary | ICD-10-CM | POA: Diagnosis present

## 2024-03-02 LAB — GLUCOSE, CAPILLARY: Glucose-Capillary: 84 mg/dL (ref 70–99)

## 2024-03-02 MED ORDER — SODIUM CHLORIDE 0.9 % IV SOLN: Freq: Once | INTRAVENOUS | Status: AC

## 2024-03-02 MED ORDER — FLUDEOXYGLUCOSE F - 18 (FDG) INJECTION
7.7000 | Freq: Once | INTRAVENOUS | Status: AC
  Administered 2024-03-02: 7.65 via INTRAVENOUS

## 2024-03-02 MED ORDER — ACETAMINOPHEN 325 MG PO TABS
650.0000 mg | ORAL_TABLET | Freq: Once | ORAL | Status: AC
  Administered 2024-03-02: 650 mg via ORAL
  Filled 2024-03-02: qty 2

## 2024-03-02 MED ORDER — HEPARIN SOD (PORK) LOCK FLUSH 100 UNIT/ML IV SOLN
500.0000 [IU] | Freq: Once | INTRAVENOUS | Status: AC
  Administered 2024-03-02: 500 [IU] via INTRAVENOUS
  Filled 2024-03-02: qty 5

## 2024-03-02 NOTE — Telephone Encounter (Signed)
 Pt is called in regarding  IVF scheduled to day and pt is aware of her appt.

## 2024-03-02 NOTE — Patient Instructions (Signed)
 Fluids Given Through an IV (IV Infusion Therapy): What to Expect IV infusion therapy is a treatment to deliver a fluid, called an infusion, into a vein. You may have IV infusion to get: Fluids. Medicines. Nutrition. Chemotherapy. This is medicines to stop or slow down cancer cells. Blood or blood products. Dye that is given before an MRI or a CT scan. This is called contrast dye. Tell a health care provider about: Any allergies you have. This includes allergies to anesthesia or dyes. All medicines you take. These include vitamins, herbs, eye drops, and creams. Any bleeding problems you have. Any surgeries you've had, including if you've had lymph nodes taken out of your armpit or if you have a arteriovenous fistula for dialysis. Any medical problems you have. Whether you're pregnant or may be pregnant. Whether you've used IV drugs. What are the risks? Your health care provider will talk with you about risks. These may include: Pain, bruising, or bleeding. Infection. The IV leaking or moving out of place. Damage to blood vessels or nerves. Allergic reactions to medicines or dyes. A blood clot. An air bubble in the vein, also called an air embolism. What happens before the procedure? Eat and drink only as you've been told. Ask about changing or stopping: Any medicines you take. Any vitamins, herbs, or supplements you take. What happens during the procedure?     Placing the catheter Your skin at the IV site will be washed with fluid that kills germs. This will help prevent infection. IV infusion therapy starts with a procedure to place a soft tube called a catheter into a vein. An IV tube will be attached to the catheter to let the infusion flow into your blood. Your catheter may be placed: Into a vein that is usually in the bend of the elbow, forearm, or back of the hand. This is called a peripheral IV catheter. This may need to be put into a vein each time you get an  infusion. Into a vein near your elbow. This is called a midline catheter or a peripherally inserted central catheter (PICC). These types of catheters may stay in place for weeks or months at a time so you can receive repeated infusions through it. Into a vein near your neck that leads to your heart. This is called a non-tunneled catheter. This is only used for short amounts of time because it can cause infection. Through the skin of your chest and into a large vein that leads to your heart. This is called a tunneled catheter. This may stay in your body for months or years. Into an implanted port. An implanted port is a device that is surgically inserted under the skin of the chest to provide long-term IV access. The catheter will connect the port to a large vein in the chest or upper arm. A port may be kept in place for many months or years. Each time you have an infusion, a needle will be inserted through your skin to connect the catheter to the port. Doing the infusion To start the infusion, your provider will: Attach the IV tubing to your catheter. Use a tape or a bandage to hold the IV in place against your skin. An IV pump may be used to control the flow of the IV infusion. During the infusion, your provider will check the area to make sure: There is no bleeding, swelling, or pain. Your IV infusion is flowing correctly. After the infusion, your provider will: Take off the bandage  or tape. Disconnect the tubing from the catheter. Remove the catheter, if you have a peripheral IV. Apply pressure over the IV insertion site to stop bleeding, then cover the area with a bandage. If you have an implanted port, PICC, non-tunneled, or tunneled catheter, your catheter may remain in place. This depends on how many times you will need treatment, your medical condition, and what type of catheter you have. These steps may vary. Ask what you can expect. What can I expect after the procedure? You may be  watched closely until you leave. This includes checking your pain level, blood pressure, heart rate, and breathing rate. Your provider will check to make sure there are no signs of infection. Follow these instructions at home: Take your medicines only as told. Change or take off your bandage as told by your provider. Ask what things are safe for you to do at home. Ask when you can go back to work or school. Do not take baths, swim, or use a hot tub until you're told it's OK. Ask if you can shower. Check your IV insertion site every day for signs of infection. Check for: Redness, swelling, or pain. Fluid or blood. If fluid or blood drains from your IV site, use your hands to press down firmly on the area for a minute or two. Doing this should stop the bleeding. Warmth. Pus or a bad smell. Contact a health care provider if: You have signs of infection around your IV site. You have fluid or blood coming from your IV site that does not stop after you put pressure to the site. You have a rash or blisters. You have itchy, red, swollen areas of skin called hives. Get help right away if: You have a fever or chills. You have chest pain. You have trouble breathing. This information is not intended to replace advice given to you by your health care provider. Make sure you discuss any questions you have with your health care provider. Document Revised: 10/09/2022 Document Reviewed: 10/09/2022 Elsevier Patient Education  2024 [employer].

## 2024-03-05 ENCOUNTER — Inpatient Hospital Stay (HOSPITAL_BASED_OUTPATIENT_CLINIC_OR_DEPARTMENT_OTHER): Admitting: Hematology

## 2024-03-05 DIAGNOSIS — C162 Malignant neoplasm of body of stomach: Secondary | ICD-10-CM

## 2024-03-05 DIAGNOSIS — Z5111 Encounter for antineoplastic chemotherapy: Secondary | ICD-10-CM | POA: Diagnosis not present

## 2024-03-05 NOTE — Assessment & Plan Note (Signed)
 [id number] with peritoneal metastasis. 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   -she started first line chemo FLOT on 05/09/22 -She understands that chemotherapy is palliative, to prolong her life.  We are unlikely going to cure her cancer. -PD-L1 0-1%, very limited benefit from PD-L1 immunotherapy, FO revealed FGFR2 amplification and fusion (+), FGFR inhibitors can be considered in future, no other targeted therapy available  -I changed her chemo from FLOT to FOLFOX on 06/20/2022 -She is not able to return to work due to the cancer and treatment related symptoms.  -I subsequently changed her treatment to maintenance Xeloda  in early August 2024, she is tolerating well overall  -her NGS Caris showed positive Claudin 18.2, she is a candidate for zolbetuximab.  -Repeated EGD on March 21, 2023 showed residual gastric cancer.  We discussed option of changing her chemotherapy back to FOLFOX and add zolbetuximab.  -PET 06/03/2023 showed stable disease (no hypermetabolic disease outside stomach) -Patient developed recurrent abdominal pain, similar to the symptoms she had when she was diagnosed.  She agreed to change treatment back to FOLFOX, and add Zolbetuximab. She started on 07/09/2023. She tolerated first cycle poorly and had prolonged recovery. Oxaliplatin  was stop after cycle 1 due to poor tolerance. She continued 5-fu/LV and zolbe every 2 weeks  -CT 10/10/2023 showed stable disease - Unfortunately PET scan on December 06, 2023 showed worsening peritoneal metastasis.  I recommend change treatment to paclitaxel  and ramucirumab , she started on 12/17/23

## 2024-03-05 NOTE — Progress Notes (Signed)
 [facility]   [phone] Fax:(336) [phone]   Clinic Follow up Note   Patient Care Team: Sheryl Debby CROME, MD as PCP - General (Internal Medicine) Sheryl Callander, MD as Consulting Physician (Oncology) 03/05/2024  I connected with Sheryl [name] on 03/05/24 at  4:00 PM EST by telephone and verified that I am speaking with the correct person using two identifiers.   I discussed the limitations, risks, security and privacy concerns of performing an evaluation and management service by telephone and the availability of in person appointments. I also discussed with the patient that there may be a patient responsible charge related to this service. The patient expressed understanding and agreed to proceed.   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   -she started first line chemo FLOT on 05/09/22 -She understands that chemotherapy is palliative, to prolong her life.  We are unlikely going to cure her cancer. -PD-L1 0-1%, very limited benefit from PD-L1 immunotherapy, FO revealed FGFR2 amplification and fusion (+), FGFR inhibitors can be considered in future, no other targeted therapy available  -I changed her chemo from FLOT to FOLFOX on 06/20/2022 -She is not able to return to work due to the cancer and treatment related symptoms.  -I subsequently changed her treatment to maintenance Xeloda  in early August 2024, she is tolerating well overall  -her NGS Caris showed positive Claudin 18.2, she is a candidate for zolbetuximab.  -Repeated EGD on  March 21, 2023 showed residual gastric cancer.  We discussed option of changing her chemotherapy back to FOLFOX and add zolbetuximab.  -PET 06/03/2023 showed stable disease (no hypermetabolic disease outside stomach) -Patient developed recurrent abdominal pain, similar to the symptoms she had when she was diagnosed.  She agreed to change treatment back to FOLFOX, and add Zolbetuximab. She started on 07/09/2023. She tolerated first cycle poorly and had prolonged recovery. Oxaliplatin  was stop after cycle 1 due to poor tolerance. She continued 5-fu/LV and zolbe every 2 weeks  -CT 10/10/2023 showed stable disease - Unfortunately PET scan on December 06, 2023 showed worsening peritoneal metastasis.  I recommend change treatment to paclitaxel  and ramucirumab , she started on 12/17/23  Assessment & Plan Metastatic gastric cancer Mild disease progression on PET scan with increased activity in the stomach and lymph nodes, though measurements remain stable. Signatera test shows improvement from 23 to 5, indicating some response to current chemotherapy regimen. Current treatment with paclitaxel  and ramucirumab  since August has shown some benefit but is associated with neuropathy. Discussed potential change to Forferi (5FU pump with irinotecan) or [name], but she prefers to continue current regimen. Emphasized balancing disease control with quality of life, considering neuropathy impact. - Continue current chemotherapy regimen with paclitaxel  and ramucirumab . - Reduce chemotherapy frequency to every other week to manage neuropathy. - Will repeat PET scan in 2-3 months to assess disease progression. - Scheduled follow-up appointment on November 12th to review PET scan and discuss treatment options.  Chemotherapy-induced peripheral neuropathy, right lower extremity weakness  Neuropathy in the right lower extremity, progressing from toes to ankle, with numbness and tingling. Likely related to paclitaxel  use. Discussed  potential for  neuropathy to worsen with continued treatment and impact on quality of life. Considered reducing chemotherapy frequency to mitigate neuropathy. - Reduce chemotherapy frequency to every other week to manage neuropathy. - Initiate physical therapy to address neuropathy and improve strength in the right leg.  Plan - I personally reviewed her restaging PET scan images and discussed findings with her, also reviewed her lab including Signatera which showed improvement. - Patient and her children are not able to make a decision on her treatment.  They would like to come in next week to discuss this further.    SUMMARY OF ONCOLOGIC HISTORY: Oncology History Overview Note   Cancer Staging  Gastric cancer [facility]) Staging form: Stomach, AJCC 8th Edition - Clinical stage from 04/19/2022: Stage IVB (cT2, cN0, pM1) - Signed by Sheryl Callander, MD on 05/08/2022 Total positive nodes: 0     Gastric cancer (HCC)  03/30/2022 Procedure   EGD:  Impression:  - Normal esophagus. - A few gastric polyps. Biopsied. - Gastritis. Biopsied. - Non-bleeding gastric ulcer with no stigmata of bleeding. Biopsied. - Normal examined duodenum. Biopsied.  Findings: Diffuse moderate inflammation characterized by congestion (edema), friability and granularity was found in the cardia, in the gastric fundus and in the gastric body. There were associated erosions in multiple places. Biopsies were taken from the antrum, body, and fundus with a cold forceps for histology. Estimated blood loss was minimal.  One non-bleeding cratered gastric ulcer with no stigmata of bleeding was found on the greater curvature of the stomach. The lesion was 6 mm in largest dimension. The mucosa around the ulcer was heaped and led to some deformity in the antrum. Biopsies were taken with a cold forceps for histology. Estimated blood loss was minimal.    03/30/2022 Pathology Results   [name]  Accession:  [id number]  Diagnosis 1. Surgical [[name]], duodenal - BENIGN SMALL BOWEL MUCOSA WITH NO SIGNIFICANT PATHOLOGIC CHANGES 2. Surgical [[name]], gastric antrum - GASTRIC ANTRAL MUCOSA WITH FEATURES OF REACTIVE GASTROPATHY - NEGATIVE FOR H. PYLORI ON H&E STAIN - NEGATIVE FOR INTESTINAL METAPLASIA OR MALIGNANCY 3. Surgical [[name]], gastric body - GASTRIC OXYNTIC MUCOSA WITH REACTIVE/REPARATIVE CHANGES - NEGATIVE FOR H. PYLORI ON H&E STAIN - NEGATIVE FOR INTESTINAL METAPLASIA, DYSPLASIA OR MALIGNANCY 4. Surgical [[name]], greater curve ulceration - ADENOCARCINOMA WITH SIGNET RING CELL FEATURES (SEE NOTE) 5. Surgical [[name]], gastric polyps - ADENOCARCINOMA WITH SIGNET RING CELL FEATURES (SEE NOTE) 6. Surgical [[name]], fundus (gastric) - ADENOCARCINOMA WITH SIGNET RING CELL FEATURES (SEE NOTE) 7. Surgical [[name]], colon, ascending, polyp (1) - TUBULAR ADENOMA. - NO HIGH GRADE DYSPLASIA OR MALIGNANCY. 8. Surgical [[name]], colon, transverse, polyp (1) - TUBULAR ADENOMA. - NO HIGH GRADE DYSPLASIA OR MALIGNANCY.    04/13/2022 Initial Diagnosis   Gastric cancer (HCC)   04/19/2022 Cancer Staging   Staging form: Stomach, AJCC 8th Edition - Clinical stage from 04/19/2022: Stage IVB (cT2, cN0, pM1) - Signed by Sheryl Callander, MD on 05/08/2022 Total positive nodes: 0   05/05/2022 Genetic Testing   Negative genetic testing on the Multi-cancer gene panel + RNA.  FH c.259C>T VUS identified.  The report date is May 05, 2022.  The Multi-Cancer + RNA Panel offered by [facility] includes sequencing and/or deletion/duplication analysis of the following 70 genes:  AIP*, ALK, APC*, ATM*, AXIN2*, BAP1*, BARD1*, BLM*, BMPR1A*, BRCA1*, BRCA2*, BRIP1*, CDC73*, CDH1*, CDK4, CDKN1B*, CDKN2A, CHEK2*, CTNNA1*, DICER1*, EPCAM (del/dup only), EGFR, FH*, FLCN*, GREM1 (promoter dup only), HOXB13, KIT, LZTR1, MAX*, MBD4, MEN1*, MET, MITF, MLH1*, MSH2*, MSH3*,  MSH6*, MUTYH*, NF1*, NF2*, NTHL1*, PALB2*, PDGFRA, PMS2*, POLD1*, POLE*, POT1*, PRKAR1A*, PTCH1*,  PTEN*, RAD51C*, RAD51D*, RB1*, RET, SDHA* (sequencing only), SDHAF2*, SDHB*, SDHC*, SDHD*, SMAD4*, SMARCA4*, SMARCB1*, SMARCE1*, STK11*, SUFU*, TMEM127*, TP53*, TSC1*, TSC2*, VHL*. RNA analysis is performed for * genes.    05/09/2022 - 06/07/2022 Chemotherapy   Patient is on Treatment Plan : GASTROESOPHAGEAL FLOT q14d X 4 cycles      Miscellaneous   Foundation One  Biomarker Findings Microsatellite status- Cannot be determined Tumor Mutational Burden- Cannot be determined  Genomic Findings  FGFR2 amplification,FGFR2-TACC2 fusion,  Rearrangement intron 17 ARAF amplification CCND3 amplification TP53 V246fs*74     05/30/2022 Imaging    IMPRESSION: 1. Mild hypermetabolism corresponding to a dominant left upper quadrant mass and smaller perigastric nodules or nodes. 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 Recommend attention to the dominant left upper quadrant soft tissue mass on follow-up exams to exclude unlikely recurrent lymphoma. 2. No gastric hypermetabolism and no typical findings of metastatic disease.   06/20/2022 - 11/16/2022 Chemotherapy   Patient is on Treatment Plan : GASTRIC FOLFOX q14d x 12 cycles     08/27/2022 Imaging    IMPRESSION: No focal gastric mass on CT.   No findings suspicious for recurrent or metastatic disease.   Stable left upper abdominal soft tissue lesion and small lymph nodes, chronic, favoring treated lymphoma.   11/27/2022 Imaging    IMPRESSION: 1. Questionable thickening of the distal esophagus/GE junction and gastric antrum, consider further evaluation with endoscopy. 2. Chronically stable left upper quadrant nodularity and prominent lymph nodes again favored treated lymphoma. Continued attention on follow-up imaging suggested. 3. No convincing evidence of metastatic disease in the chest, abdomen or pelvis. 4. Questionable asymmetric wall thickening of the rectum, consider further evaluation with  colonoscopy. 5. Mild wall thickening of a nondistended urinary bladder, correlate with urinalysis to exclude cystitis. 6. Hepatic steatosis.   07/10/2023 - 11/27/2023 Chemotherapy   Patient is on Treatment Plan : GASTROESOPHAGEAL Zolbetuximab (800/400) + FOLFOX D1,15,29 q42d x 4 cycles / Zolbetuximab (400) + 5FU + Leucovorin  D1,15,29 q42d     12/17/2023 -  Chemotherapy   Patient is on Treatment Plan : GASTRIC/GE JUNCTION PACLitaxel  D1,8,15 + Ramucirumab  D1,15 q28d       Discussed the use of AI scribe software for clinical note transcription with the patient, who gave verbal consent to proceed.  History of Present Illness Sheryl [name] is a 58 year old female with metastatic gastric cancer who presents for follow-up. She is accompanied by her father and son.  She is undergoing treatment with paclitaxel  and ramucirumab  since August 2025. A recent PET scan shows increased activity in the stomach and lymph nodes compared to the previous scan, although the measurements of the lymph nodes and nodules remain similar. A Signatera blood test from October 2025 indicates improvement with a decrease in detectable tumor cells from 23 to 5 since May 2025.  She experiences weakness and numbness in her right foot, progressing from her toes to her ankle, described as numbness and tingling. She uses a walker for mobility. Her family is concerned about the potential worsening of neuropathy, although no significant worsening is noted.  Her treatment history includes FLOT, oxaliplatin , capecitabine  (Xeloda ), and an antibody treatment. In August 2025, imaging changes led to a switch in her regimen. She has been on various chemotherapy regimens, including a 5-FU pump and an experimental antibody treatment. Her family recalls better tolerance and maintained eating habits  while on capecitabine , despite experiencing stomach pain attributed to acid reflux.  She currently experiences fatigue and weakness, which her  family attributes to her chemotherapy regimen. Her family is actively involved in her care and decision-making process, expressing concerns about side effects.     REVIEW OF SYSTEMS:   Constitutional: Denies fevers, chills or abnormal weight loss Eyes: Denies blurriness of vision Ears, nose, mouth, throat, and face: Denies mucositis or sore throat Respiratory: Denies cough, dyspnea or wheezes Cardiovascular: Denies palpitation, chest discomfort or lower extremity swelling Gastrointestinal:  Denies nausea, heartburn or change in bowel habits Skin: Denies abnormal skin rashes Lymphatics: Denies new lymphadenopathy or easy bruising Neurological:Denies numbness, tingling or new weaknesses Behavioral/Psych: Mood is stable, no new changes  All other systems were reviewed with the patient and are negative.  MEDICAL HISTORY:  Past Medical History:  Diagnosis Date   Blood transfusion without reported diagnosis    had transfusion with hysterectomy   Cataract    Colon polyps 2012   Diabetes (HCC) 03/13/2021   Diabetes (HCC) 05/21/2019   Family history of breast cancer    Family history of pancreatic cancer    Family history of stomach cancer    Fibroid    gastric ca 03/2022   GERD (gastroesophageal reflux disease)    H/O blood clots    History of hysterectomy    fibroids and heavy cycles   Hypertension     SURGICAL HISTORY: Past Surgical History:  Procedure Laterality Date   ABDOMINAL HYSTERECTOMY     BIOPSY  04/19/2022   Procedure: BIOPSY;  Surgeon: Wilhelmenia Aloha Raddle., MD;  Location: [facility];  Service: Gastroenterology;;   BIOPSY  03/21/2023   Procedure: BIOPSY;  Surgeon: Wilhelmenia Aloha Raddle., MD;  Location: [facility];  Service: Gastroenterology;;   COLONOSCOPY     ESOPHAGOGASTRODUODENOSCOPY (EGD) WITH PROPOFOL  N/A 04/19/2022   Procedure: ESOPHAGOGASTRODUODENOSCOPY (EGD) WITH PROPOFOL ;  Surgeon: Wilhelmenia Aloha Raddle., MD;  Location: [facility];  Service:  Gastroenterology;  Laterality: N/A;   ESOPHAGOGASTRODUODENOSCOPY (EGD) WITH PROPOFOL  N/A 03/21/2023   Procedure: ESOPHAGOGASTRODUODENOSCOPY (EGD) WITH PROPOFOL ;  Surgeon: Wilhelmenia Aloha Raddle., MD;  Location: [facility];  Service: Gastroenterology;  Laterality: N/A;   EUS N/A 04/19/2022   Procedure: UPPER ENDOSCOPIC ULTRASOUND (EUS) RADIAL;  Surgeon: Wilhelmenia Aloha Raddle., MD;  Location: [facility];  Service: Gastroenterology;  Laterality: N/A;   EXCISION OF SKIN TAG  05/03/2022   Procedure: EXCISION OF CHEST WALL SKIN LESION;  Surgeon: Dasie Leonor CROME, MD;  Location: [facility];  Service: General;;   LAPAROSCOPY N/A 05/03/2022   Procedure: LAPAROSCOPY DIAGNOSTIC WITH PERITONEAL WASHINGS;  Surgeon: Dasie Leonor CROME, MD;  Location: [facility];  Service: General;  Laterality: N/A;   POLYPECTOMY  04/19/2022   Procedure: POLYPECTOMY;  Surgeon: Wilhelmenia Aloha Raddle., MD;  Location: [facility];  Service: Gastroenterology;;   PORTACATH PLACEMENT N/A 05/03/2022   Procedure: INSERTION PORT-A-CATH WITH ULTRASOUND GUIDANCE;  Surgeon: Dasie Leonor CROME, MD;  Location: [facility];  Service: General;  Laterality: N/A;   UPPER GASTROINTESTINAL ENDOSCOPY      I have reviewed the social history and family history with the patient and they are unchanged from previous note.  ALLERGIES:  is allergic to aspirin, cyclobenzaprine, naproxen sodium, zithromax [azithromycin dihydrate], oxaliplatin , and dilaudid [hydromorphone].  MEDICATIONS:  Current Outpatient Medications  Medication Sig Dispense Refill   acetaminophen  (TYLENOL ) 500 MG tablet Take 2 tablets (1,000 mg total) by mouth every 8 (eight) hours as needed (pain). 30 tablet 1   amLODipine  (NORVASC ) 10  MG tablet Take 1 tablet (10 mg total) by mouth daily. 90 tablet 0   b complex vitamins capsule Take 1 capsule by mouth daily.     calcium -vitamin D (OSCAL WITH D) 500-5 MG-MCG tablet Take 2 tablets by mouth 2 (two) times daily.     famotidine  (PEPCID ) 20 MG tablet Take 1  tablet (20 mg total) by mouth 2 (two) times daily. 60 tablet 2   HYDROcodone -acetaminophen  (NORCO/VICODIN) 5-325 MG tablet Take 1 tablet by mouth every 12 (twelve) hours as needed for moderate pain (pain score 4-6). 60 tablet 0   lidocaine -prilocaine  (EMLA ) cream Apply 1 Application topically as needed. 30 g 1   metoCLOPramide  (REGLAN ) 10 MG tablet Take 1 tablet (10 mg total) by mouth every 8 (eight) hours as needed for nausea. 60 tablet 1   pantoprazole  (PROTONIX ) 40 MG tablet Take 1 tablet (40 mg total) by mouth daily. 30 tablet 2   potassium chloride  (KLOR-CON  M) 10 MEQ tablet Take 1 tablet (10 mEq total) by mouth 2 (two) times daily. 60 tablet 1   prochlorperazine  (COMPAZINE ) 10 MG tablet Take 1 tablet (10 mg total) by mouth every 6 (six) hours as needed for nausea or vomiting. 30 tablet 2   promethazine  (PHENERGAN ) 25 MG tablet Take 2 tablets (50 mg total) by mouth 2 (two) times daily as needed for nausea or vomiting. 90 tablet 1   scopolamine  (TRANSDERM-SCOP) 1 MG/3DAYS Place 1 patch (1.5 mg total) onto the skin every 3 (three) days. 10 patch 1   sucralfate  (CARAFATE ) 1 g tablet Take 1 tablet (1 g total) by mouth 4 (four) times daily. 120 tablet 1   zinc gluconate 50 MG tablet Take 50 mg by mouth daily.     No current facility-administered medications for this visit.    PHYSICAL EXAMINATION: Not performed   LABORATORY DATA:  I have reviewed the data as listed    Latest Ref Rng & Units 02/26/2024    8:46 AM 02/19/2024    7:25 AM 02/12/2024   10:16 AM  CBC  WBC 4.0 - 10.5 K/uL 2.5  2.6  2.6   Hemoglobin 12.0 - 15.0 g/dL 88.2  87.7  88.0   Hematocrit 36.0 - 46.0 % 33.6  34.6  34.4   Platelets 150 - 400 K/uL 360  383  427         Latest Ref Rng & Units 02/26/2024    8:46 AM 02/19/2024    7:25 AM 02/12/2024   10:16 AM  CMP  Glucose 70 - 99 mg/dL 886  883  896   BUN 6 - 20 mg/dL 8  11  10    Creatinine 0.44 - 1.00 mg/dL 9.51  9.48  9.48   Sodium 135 - 145 mmol/L 136  138  138    Potassium 3.5 - 5.1 mmol/L 3.2  3.5  3.7   Chloride 98 - 111 mmol/L 104  105  104   CO2 22 - 32 mmol/L 26  28  29    Calcium  8.9 - 10.3 mg/dL 9.1  9.5  9.5   Total Protein 6.5 - 8.1 g/dL 6.4  6.3  6.3   Total Bilirubin 0.0 - 1.2 mg/dL 0.5  0.4  0.4   Alkaline Phos 38 - 126 U/L 57  73  75   AST 15 - 41 U/L 17  22  16    ALT 0 - 44 U/L 9  10  7        RADIOGRAPHIC STUDIES: I have  personally reviewed the radiological images as listed and agreed with the findings in the report. No results found.     I discussed the assessment and treatment plan with the patient. The patient was provided an opportunity to ask questions and all were answered. The patient agreed with the plan and demonstrated an understanding of the instructions.   The patient was advised to call back or seek an in-person evaluation if the symptoms worsen or if the condition fails to improve as anticipated.  I provided 40 minutes of non face-to-face telephone visit time during this encounter, including review of chart and various tests results, discussions about plan of care and coordination of care plan.    Onita Mattock, MD 03/05/24

## 2024-03-06 ENCOUNTER — Other Ambulatory Visit: Payer: Self-pay

## 2024-03-11 ENCOUNTER — Other Ambulatory Visit: Payer: Self-pay

## 2024-03-11 ENCOUNTER — Inpatient Hospital Stay: Admitting: Dietician

## 2024-03-11 ENCOUNTER — Inpatient Hospital Stay

## 2024-03-11 ENCOUNTER — Other Ambulatory Visit (HOSPITAL_COMMUNITY): Payer: Self-pay

## 2024-03-11 ENCOUNTER — Inpatient Hospital Stay (HOSPITAL_BASED_OUTPATIENT_CLINIC_OR_DEPARTMENT_OTHER): Admitting: Hematology

## 2024-03-11 VITALS — BP 120/88 | HR 106 | Temp 97.8°F | Resp 19 | Ht 68.0 in | Wt 151.5 lb

## 2024-03-11 VITALS — HR 99

## 2024-03-11 DIAGNOSIS — C162 Malignant neoplasm of body of stomach: Secondary | ICD-10-CM

## 2024-03-11 DIAGNOSIS — Z95828 Presence of other vascular implants and grafts: Secondary | ICD-10-CM

## 2024-03-11 DIAGNOSIS — Z5111 Encounter for antineoplastic chemotherapy: Secondary | ICD-10-CM | POA: Diagnosis not present

## 2024-03-11 LAB — CBC WITH DIFFERENTIAL (CANCER CENTER ONLY)
Abs Immature Granulocytes: 0 K/uL (ref 0.00–0.07)
Basophils Absolute: 0 K/uL (ref 0.0–0.1)
Basophils Relative: 1 %
Eosinophils Absolute: 0.1 K/uL (ref 0.0–0.5)
Eosinophils Relative: 2 %
HCT: 36.4 % (ref 36.0–46.0)
Hemoglobin: 12.4 g/dL (ref 12.0–15.0)
Immature Granulocytes: 0 %
Lymphocytes Relative: 28 %
Lymphs Abs: 0.8 K/uL (ref 0.7–4.0)
MCH: 28.6 pg (ref 26.0–34.0)
MCHC: 34.1 g/dL (ref 30.0–36.0)
MCV: 84.1 fL (ref 80.0–100.0)
Monocytes Absolute: 0.5 K/uL (ref 0.1–1.0)
Monocytes Relative: 18 %
Neutro Abs: 1.5 K/uL — ABNORMAL LOW (ref 1.7–7.7)
Neutrophils Relative %: 51 %
Platelet Count: 408 K/uL — ABNORMAL HIGH (ref 150–400)
RBC: 4.33 MIL/uL (ref 3.87–5.11)
RDW: 16 % — ABNORMAL HIGH (ref 11.5–15.5)
WBC Count: 3 K/uL — ABNORMAL LOW (ref 4.0–10.5)
nRBC: 0 % (ref 0.0–0.2)

## 2024-03-11 LAB — CMP (CANCER CENTER ONLY)
ALT: 7 U/L (ref 0–44)
AST: 17 U/L (ref 15–41)
Albumin: 3.8 g/dL (ref 3.5–5.0)
Alkaline Phosphatase: 68 U/L (ref 38–126)
Anion gap: 6 (ref 5–15)
BUN: 8 mg/dL (ref 6–20)
CO2: 28 mmol/L (ref 22–32)
Calcium: 9.2 mg/dL (ref 8.9–10.3)
Chloride: 105 mmol/L (ref 98–111)
Creatinine: 0.47 mg/dL (ref 0.44–1.00)
GFR, Estimated: >60 mL/min (ref >60)
Glucose, Bld: 113 mg/dL — ABNORMAL HIGH (ref 70–99)
Potassium: 3.1 mmol/L — ABNORMAL LOW (ref 3.5–5.1)
Sodium: 139 mmol/L (ref 135–145)
Total Bilirubin: 0.4 mg/dL (ref 0.0–1.2)
Total Protein: 6.5 g/dL (ref 6.5–8.1)

## 2024-03-11 MED ORDER — SODIUM CHLORIDE 0.9 % IV SOLN
60.0000 mg/m2 | Freq: Once | INTRAVENOUS | Status: AC
  Administered 2024-03-11: 114 mg via INTRAVENOUS
  Filled 2024-03-11: qty 19

## 2024-03-11 MED ORDER — POTASSIUM CHLORIDE CRYS ER 10 MEQ PO TBCR
10.0000 meq | EXTENDED_RELEASE_TABLET | Freq: Three times a day (TID) | ORAL | 1 refills | Status: DC
  Filled 2024-03-11 (×2): qty 90, 30d supply, fill #0
  Filled 2024-04-27: qty 90, 30d supply, fill #1

## 2024-03-11 MED ORDER — ACETAMINOPHEN 325 MG PO TABS
650.0000 mg | ORAL_TABLET | Freq: Once | ORAL | Status: AC
  Administered 2024-03-11: 650 mg via ORAL
  Filled 2024-03-11: qty 2

## 2024-03-11 MED ORDER — SODIUM CHLORIDE 0.9 % IV SOLN: INTRAVENOUS | Status: AC

## 2024-03-11 MED ORDER — FAMOTIDINE IN NACL 20-0.9 MG/50ML-% IV SOLN
20.0000 mg | Freq: Once | INTRAVENOUS | Status: AC
  Administered 2024-03-11: 20 mg via INTRAVENOUS
  Filled 2024-03-11: qty 50

## 2024-03-11 MED ORDER — SODIUM CHLORIDE 0.9% FLUSH
10.0000 mL | Freq: Once | INTRAVENOUS | Status: AC
  Administered 2024-03-11: 10 mL

## 2024-03-11 MED ORDER — SODIUM CHLORIDE 0.9 % IV SOLN
8.0000 mg/kg | Freq: Once | INTRAVENOUS | Status: AC
  Administered 2024-03-11: 600 mg via INTRAVENOUS
  Filled 2024-03-11: qty 10

## 2024-03-11 MED ORDER — DIPHENHYDRAMINE HCL 50 MG/ML IJ SOLN
25.0000 mg | Freq: Once | INTRAMUSCULAR | Status: AC
  Administered 2024-03-11: 25 mg via INTRAVENOUS
  Filled 2024-03-11: qty 1

## 2024-03-11 MED ORDER — DEXAMETHASONE SOD PHOSPHATE PF 10 MG/ML IJ SOLN
10.0000 mg | Freq: Once | INTRAMUSCULAR | Status: AC
  Administered 2024-03-11: 10 mg via INTRAVENOUS

## 2024-03-11 MED ORDER — SODIUM CHLORIDE 0.9% FLUSH: 10.0000 mL | INTRAVENOUS | Status: DC | PRN

## 2024-03-11 NOTE — Progress Notes (Signed)
 Nutrition Follow-up:  Patient with gastric cancer. She is currently receiving reduced dose Zolbetuximab/5FU + Leucovorin  q42d (start 07/09/23). Regimen discontinued 8/13 due to progression. Currently receiving dose reduced paclitaxel  + ramucirumab . Patient is under the care of Dr. Lanny.   Met with patient in infusion. Her daughter is present at visit today. She is doing okay today. Disappointed with continued wt loss. Patient reports episode of significant stomach pain and vomiting after drinking smoothie her daughter made over the weekend (sour sop, strawberries, blueberries). She did not eat anything on Saturday. Patient recalls Sunday as a good day. Got up, ate a bowl of cereal and sat outside for a while. This was nice. Yesterday she had blueberry bagel with cream cheese, glucerna, chz/turkey roll up.   Sister of patient has moved in to assist with taking care of their mother. Sister has been encouraging of oral intake. Her nausea has been well controlled as she is taking medications consistently. Patient says she would try eating more during the day if nausea medication (compazine ) didn't make her so sleepy.    Medications: reviewed   Labs: K 3.1, glucose 113  Anthropometrics: Wt 151 lb 8 oz today   10/29 - 154 lb 10/1 - 158 lb 4.8 oz 9/3 - 161 lb 8 oz    NUTRITION DIAGNOSIS: Unintended wt loss - ongoing    INTERVENTION:  Reviewed strategies for increasing calories/protein with bites/sips q2h Suggested using reglan  during the day as this should not promote drowsiness  Continue daily glucerna as tolerated  Support and encouragement     MONITORING, EVALUATION, GOAL: wt trends, intake   NEXT VISIT: To be scheduled with infusion

## 2024-03-11 NOTE — Patient Instructions (Signed)
 [facility] - A DEPT OF [facility]  Discharge Instructions: Thank you for choosing [facility] to provide your oncology and hematology care.   If you have a lab appointment with the Cancer Center, please go directly to the Cancer Center and check in at the registration area.   Wear comfortable clothing and clothing appropriate for easy access to any Portacath or PICC line.   We strive to give you quality time with your provider. You may need to reschedule your appointment if you arrive late (15 or more minutes).  Arriving late affects you and other patients whose appointments are after yours.  Also, if you miss three or more appointments without notifying the office, you may be dismissed from the clinic at the provider's discretion.      For prescription refill requests, have your pharmacy contact our office and allow 72 hours for refills to be completed.    Today you received the following chemotherapy and/or immunotherapy agents cyramza  and taxol       To help prevent nausea and vomiting after your treatment, we encourage you to take your nausea medication as directed.  BELOW ARE SYMPTOMS THAT SHOULD BE REPORTED IMMEDIATELY: *FEVER GREATER THAN 100.4 F (38 C) OR HIGHER *CHILLS OR SWEATING *NAUSEA AND VOMITING THAT IS NOT CONTROLLED WITH YOUR NAUSEA MEDICATION *UNUSUAL SHORTNESS OF BREATH *UNUSUAL BRUISING OR BLEEDING *URINARY PROBLEMS (pain or burning when urinating, or frequent urination) *BOWEL PROBLEMS (unusual diarrhea, constipation, pain near the anus) TENDERNESS IN MOUTH AND THROAT WITH OR WITHOUT PRESENCE OF ULCERS (sore throat, sores in mouth, or a toothache) UNUSUAL RASH, SWELLING OR PAIN  UNUSUAL VAGINAL DISCHARGE OR ITCHING   Items with * indicate a potential emergency and should be followed up as soon as possible or go to the Emergency Department if any problems should occur.  Please show the CHEMOTHERAPY ALERT [place] or  IMMUNOTHERAPY ALERT [place] at check-in to the Emergency Department and triage nurse.  Should you have questions after your visit or need to cancel or reschedule your appointment, please contact [facility] - A DEPT OF [facility]  Dept: [phone]  and follow the prompts.  Office hours are 8:00 a.m. to 4:30 p.m. Monday - Friday. Please note that voicemails left after 4:00 p.m. may not be returned until the following business day.  We are closed weekends and major holidays. You have access to a nurse at all times for urgent questions. Please call the main number to the clinic Dept: [phone] and follow the prompts.   For any non-urgent questions, you may also contact your provider using MyChart. We now offer e-Visits for anyone 69 and older to request care online for non-urgent symptoms. For details visit [email or web address].   Also download the MyChart app! Go to the app store, search MyChart, open the app, select [place], and log in with your MyChart username and password.

## 2024-03-11 NOTE — Progress Notes (Signed)
 Per Dr. Lanny We have decided to change tx to [facility], so will keep next week chemo as last one. Also, ok to proceed with Cyramza  today as pt has not had a GIB recently per Dr. Lanny.  Keilynn Marano, PharmD, MBA

## 2024-03-11 NOTE — Progress Notes (Addendum)
 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   -she started first line chemo FLOT on 05/09/22 -She understands that chemotherapy is palliative, to prolong her life.  We are unlikely going to cure her cancer. -PD-L1 0-1%, very limited benefit from PD-L1 immunotherapy, FO revealed FGFR2 amplification and fusion (+), FGFR inhibitors can be considered in future, no other targeted therapy available  -I changed her chemo from FLOT to FOLFOX on 06/20/2022 -She is not able to return to work due to the cancer and treatment related symptoms.  -I subsequently changed her treatment to maintenance Xeloda  in early August 2024, she is tolerating well overall  -her NGS Caris showed positive Claudin 18.2, she is a candidate for zolbetuximab.  -Repeated EGD on March 21, 2023 showed residual gastric cancer.  We discussed option of changing her chemotherapy back to FOLFOX and add zolbetuximab.  -PET 06/03/2023 showed stable disease (no hypermetabolic disease outside stomach) -Patient developed recurrent abdominal pain, similar to the symptoms she had when she was diagnosed.  She agreed to change treatment back to FOLFOX, and add Zolbetuximab. She started on 07/09/2023. She tolerated first cycle poorly and had prolonged recovery. Oxaliplatin  was stop after cycle 1 due to poor tolerance. She continued 5-fu/LV and zolbe  every 2 weeks  -CT 10/10/2023 showed stable disease - Unfortunately PET scan on December 06, 2023 showed worsening peritoneal metastasis.  I recommend change treatment to paclitaxel  and ramucirumab , she started on 12/17/23 -PET 03/02/2024 showed disease progression in stomack and nodes, but Signatera showed improved ctDNA  Assessment & Plan Metastatic gastric cancer with peritoneal metastasis Disease progression on recent PET scan with increased activity in the stomach and peritoneal nodule. Previous treatments included chemotherapy and antibody therapy with limited response. Current treatment with paclitaxel  causing significant neuropathy. Discussed options including irinotecan and oral chemotherapy [name] to balance cancer control and quality of life. [name] is more tolerable but less effective, with a one-third response rate. Guardian 360 test considered for new target therapy options. - Continue current chemotherapy regimen until December 1st. - Will switch to oral chemotherapy (long serve) starting December 1st. - Will repeat PET scan after two cycles of oral chemotherapy. - Will consider Guardian 360 test for new target therapy options.  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 Symptoms worsened with current chemotherapy regimen. Discussed balancing cancer control with quality of life considerations. - Switched to oral chemotherapy ([name]) to potentially reduce neuropathy. - Referred to outpatient physical therapy for neuropathy management. - Due to the asymmetry neuropathy, right lower extremity weakness, will obtain brain MRI to rule out stroke.  Chemotherapy-related nausea, vomiting, and unintentional weight loss Nausea and vomiting managed with medication. Weight loss of six pounds over the past month. Discussed increasing snacking between meals to address weight  loss. - Continue current anti-nausea medication. - Increase snacking between meals to address weight loss. - Referred to dietitian  for nutritional support.  Chemotherapy-induced neutropenia Neutrophil count at 1.5, adequate for treatment continuation. - Continue to monitor blood counts regularly.  Hypokalemia Potassium levels slightly low. Currently taking potassium supplements twice daily. - Increased potassium supplementation to three times daily.  Plan - I again reviewed her treatment course, and multiple PET scan images with the patient and her daughter and son, and answered her questions. - After lengthy discussion, patient and her family agreed to continue current chemotherapy today and next week, and switch to [facility] on December 1 - Follow-up on December 1 - PT referral for neuropathy and balance issue. -brain MRI w wo contrast to rule out stroke   SUMMARY OF ONCOLOGIC HISTORY: Oncology History Overview Note   Cancer Staging  Gastric cancer [facility]) Staging form: Stomach, AJCC 8th Edition - Clinical stage from 04/19/2022: Stage IVB (cT2, cN0, pM1) - Signed by [name], MD on 05/08/2022 Total positive nodes: 0     Gastric cancer (HCC)  03/30/2022 Procedure   EGD:  Impression:  - Normal esophagus. - A few gastric polyps. Biopsied. - Gastritis. Biopsied. - Non-bleeding gastric ulcer with no stigmata of bleeding. Biopsied. - Normal examined duodenum. Biopsied.  Findings: Diffuse moderate inflammation characterized by congestion (edema), friability and granularity was found in the cardia, in the gastric fundus and in the gastric body. There were associated erosions in multiple places. Biopsies were taken from the antrum, body, and fundus with a cold forceps for histology. Estimated blood loss was minimal.  One non-bleeding cratered gastric ulcer with no stigmata of bleeding was found on the greater curvature of the stomach. The lesion was 6 mm in largest dimension. The  mucosa around the ulcer was heaped and led to some deformity in the antrum. Biopsies were taken with a cold forceps for histology. Estimated blood loss was minimal.    03/30/2022 Pathology Results   Patient: Gertz, Carisha P  Accession: [id number]  Diagnosis 1. Surgical [P], duodenal - BENIGN SMALL BOWEL MUCOSA WITH NO SIGNIFICANT PATHOLOGIC CHANGES 2. Surgical [P], gastric antrum - GASTRIC ANTRAL MUCOSA WITH FEATURES OF REACTIVE GASTROPATHY - NEGATIVE FOR H. PYLORI ON H&E STAIN - NEGATIVE FOR INTESTINAL METAPLASIA OR MALIGNANCY 3. Surgical [P], gastric body - GASTRIC OXYNTIC MUCOSA WITH REACTIVE/REPARATIVE CHANGES - NEGATIVE FOR H. PYLORI ON H&E STAIN - NEGATIVE FOR INTESTINAL METAPLASIA, DYSPLASIA OR MALIGNANCY 4. Surgical [P], greater curve ulceration - ADENOCARCINOMA WITH SIGNET RING CELL FEATURES (SEE NOTE) 5. Surgical [P], gastric polyps - ADENOCARCINOMA WITH SIGNET RING CELL FEATURES (SEE NOTE) 6. Surgical [P], fundus (gastric) - ADENOCARCINOMA WITH SIGNET RING CELL FEATURES (SEE NOTE) 7. Surgical [P], colon, ascending, polyp (1) - TUBULAR ADENOMA. - NO HIGH GRADE DYSPLASIA OR MALIGNANCY. 8. Surgical [P], colon, transverse, polyp (1) - TUBULAR ADENOMA. - NO HIGH GRADE DYSPLASIA OR MALIGNANCY.    04/13/2022 Initial Diagnosis   Gastric cancer (HCC)   04/19/2022 Cancer Staging   Staging form: Stomach, AJCC 8th Edition - Clinical stage from 04/19/2022: Stage IVB (cT2, cN0, pM1) - Signed by [name], MD on 05/08/2022 Total positive nodes: 0   05/05/2022 Genetic Testing   Negative genetic testing on the Multi-cancer gene panel + RNA.  FH c.259C>T VUS identified.  The report date is May 05, 2022.  The Multi-Cancer + RNA Panel offered by [facility] includes sequencing and/or deletion/duplication analysis of the following 70 genes:  AIP*, ALK, APC*, ATM*, AXIN2*, BAP1*, BARD1*, BLM*, BMPR1A*, BRCA1*, BRCA2*, BRIP1*, CDC73*, CDH1*, CDK4, CDKN1B*, CDKN2A, CHEK2*, CTNNA1*,  DICER1*, EPCAM (del/dup only), EGFR, FH*,  FLCN*, GREM1 (promoter dup only), HOXB13, KIT, LZTR1, MAX*, MBD4, MEN1*, MET, MITF, MLH1*, MSH2*, MSH3*, MSH6*, MUTYH*, NF1*, NF2*, NTHL1*, PALB2*, PDGFRA, PMS2*, POLD1*, POLE*, POT1*, PRKAR1A*, PTCH1*, PTEN*, RAD51C*, RAD51D*, RB1*, RET, SDHA* (sequencing only), SDHAF2*, SDHB*, SDHC*, SDHD*, SMAD4*, SMARCA4*, SMARCB1*, SMARCE1*, STK11*, SUFU*, TMEM127*, TP53*, TSC1*, TSC2*, VHL*. RNA analysis is performed for * genes.    05/09/2022 - 06/07/2022 Chemotherapy   Patient is on Treatment Plan : GASTROESOPHAGEAL FLOT q14d X 4 cycles      Miscellaneous   Foundation One  Biomarker Findings Microsatellite status- Cannot be determined Tumor Mutational Burden- Cannot be determined  Genomic Findings  FGFR2 amplification,FGFR2-TACC2 fusion,  Rearrangement intron 17 ARAF amplification CCND3 amplification TP53 V298fs*74     05/30/2022 Imaging    IMPRESSION: 1. Mild hypermetabolism corresponding to a dominant left upper quadrant mass and smaller perigastric nodules or nodes. 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 Recommend attention to the dominant left upper quadrant soft tissue mass on follow-up exams to exclude unlikely recurrent lymphoma. 2. No gastric hypermetabolism and no typical findings of metastatic disease.   06/20/2022 - 11/16/2022 Chemotherapy   Patient is on Treatment Plan : GASTRIC FOLFOX q14d x 12 cycles     08/27/2022 Imaging    IMPRESSION: No focal gastric mass on CT.   No findings suspicious for recurrent or metastatic disease.   Stable left upper abdominal soft tissue lesion and small lymph nodes, chronic, favoring treated lymphoma.   11/27/2022 Imaging    IMPRESSION: 1. Questionable thickening of the distal esophagus/GE junction and gastric antrum, consider further evaluation with endoscopy. 2. Chronically stable left upper quadrant nodularity and prominent lymph nodes again favored  treated lymphoma. Continued attention on follow-up imaging suggested. 3. No convincing evidence of metastatic disease in the chest, abdomen or pelvis. 4. Questionable asymmetric wall thickening of the rectum, consider further evaluation with colonoscopy. 5. Mild wall thickening of a nondistended urinary bladder, correlate with urinalysis to exclude cystitis. 6. Hepatic steatosis.   07/10/2023 - 11/27/2023 Chemotherapy   Patient is on Treatment Plan : GASTROESOPHAGEAL Zolbetuximab (800/400) + FOLFOX D1,15,29 q42d x 4 cycles / Zolbetuximab (400) + 5FU + Leucovorin  D1,15,29 q42d     12/17/2023 -  Chemotherapy   Patient is on Treatment Plan : GASTRIC/GE JUNCTION PACLitaxel  D1,8,15 + Ramucirumab  D1,15 q28d        Discussed the use of AI scribe software for clinical note transcription with the patient, who gave verbal consent to proceed.  History of Present Illness Sheryl [name] is a 57 year old female with metastatic gastric cancer who presents for follow-up. She is accompanied by her daughter.  She experiences neuropathy in her right foot, with numbness and difficulty lifting the foot, affecting her mobility. Wearing shoes worsens the symptoms, and she occasionally uses a cane. No recent falls have occurred.  She has lost six pounds over the past month despite adequate food intake. She takes medication for nausea and vomiting, which is effective. She experiences early satiety and stomach discomfort with eating or drinking, even with sucralfate  and pantoprazole .  Her cancer treatment history includes a diagnosis of gastric cancer with peritoneal metastasis in December 2023. She began chemotherapy in January 2024, initially with a pump, then switched to a pill in August 2024. Disease progression led to a new regimen in March 2025, including an antibody treatment. She has been on paclitaxel  since August 2025.  She takes potassium twice daily, but her levels remain low. Her current  medications  also include sucralfate  and pantoprazole  for stomach issues, and she uses nausea medication as needed.     All other systems were reviewed with the patient and are negative.  MEDICAL HISTORY:  Past Medical History:  Diagnosis Date   Blood transfusion without reported diagnosis    had transfusion with hysterectomy   Cataract    Colon polyps 2012   Diabetes (HCC) 03/13/2021   Diabetes (HCC) 05/21/2019   Family history of breast cancer    Family history of pancreatic cancer    Family history of stomach cancer    Fibroid    gastric ca 03/2022   GERD (gastroesophageal reflux disease)    H/O blood clots    History of hysterectomy    fibroids and heavy cycles   Hypertension     SURGICAL HISTORY: Past Surgical History:  Procedure Laterality Date   ABDOMINAL HYSTERECTOMY     BIOPSY  04/19/2022   Procedure: BIOPSY;  Surgeon: Wilhelmenia Aloha Raddle., MD;  Location: [facility];  Service: Gastroenterology;;   BIOPSY  03/21/2023   Procedure: BIOPSY;  Surgeon: Wilhelmenia Aloha Raddle., MD;  Location: [facility];  Service: Gastroenterology;;   COLONOSCOPY     ESOPHAGOGASTRODUODENOSCOPY (EGD) WITH PROPOFOL  N/A 04/19/2022   Procedure: ESOPHAGOGASTRODUODENOSCOPY (EGD) WITH PROPOFOL ;  Surgeon: Wilhelmenia Aloha Raddle., MD;  Location: [facility];  Service: Gastroenterology;  Laterality: N/A;   ESOPHAGOGASTRODUODENOSCOPY (EGD) WITH PROPOFOL  N/A 03/21/2023   Procedure: ESOPHAGOGASTRODUODENOSCOPY (EGD) WITH PROPOFOL ;  Surgeon: Wilhelmenia Aloha Raddle., MD;  Location: [facility];  Service: Gastroenterology;  Laterality: N/A;   EUS N/A 04/19/2022   Procedure: UPPER ENDOSCOPIC ULTRASOUND (EUS) RADIAL;  Surgeon: Wilhelmenia Aloha Raddle., MD;  Location: [facility];  Service: Gastroenterology;  Laterality: N/A;   EXCISION OF SKIN TAG  05/03/2022   Procedure: EXCISION OF CHEST WALL SKIN LESION;  Surgeon: Dasie Leonor CROME, MD;  Location: [facility];  Service: General;;   LAPAROSCOPY N/A 05/03/2022    Procedure: LAPAROSCOPY DIAGNOSTIC WITH PERITONEAL WASHINGS;  Surgeon: Dasie Leonor CROME, MD;  Location: [facility];  Service: General;  Laterality: N/A;   POLYPECTOMY  04/19/2022   Procedure: POLYPECTOMY;  Surgeon: Wilhelmenia Aloha Raddle., MD;  Location: [facility];  Service: Gastroenterology;;   PORTACATH PLACEMENT N/A 05/03/2022   Procedure: INSERTION PORT-A-CATH WITH ULTRASOUND GUIDANCE;  Surgeon: Dasie Leonor CROME, MD;  Location: [facility];  Service: General;  Laterality: N/A;   UPPER GASTROINTESTINAL ENDOSCOPY      I have reviewed the social history and family history with the patient and they are unchanged from previous note.  ALLERGIES:  is allergic to aspirin, cyclobenzaprine, naproxen sodium, zithromax [azithromycin dihydrate], oxaliplatin , and dilaudid [hydromorphone].  MEDICATIONS:  Current Outpatient Medications  Medication Sig Dispense Refill   acetaminophen  (TYLENOL ) 500 MG tablet Take 2 tablets (1,000 mg total) by mouth every 8 (eight) hours as needed (pain). 30 tablet 1   amLODipine  (NORVASC ) 10 MG tablet Take 1 tablet (10 mg total) by mouth daily. 90 tablet 0   b complex vitamins capsule Take 1 capsule by mouth daily.     calcium -vitamin D (OSCAL WITH D) 500-5 MG-MCG tablet Take 2 tablets by mouth 2 (two) times daily.     famotidine  (PEPCID ) 20 MG tablet Take 1 tablet (20 mg total) by mouth 2 (two) times daily. 60 tablet 2   HYDROcodone -acetaminophen  (NORCO/VICODIN) 5-325 MG tablet Take 1 tablet by mouth every 12 (twelve) hours as needed for moderate pain (pain score 4-6). 60 tablet 0   lidocaine -prilocaine  (EMLA ) cream Apply 1 Application  topically as needed. 30 g 1   metoCLOPramide  (REGLAN ) 10 MG tablet Take 1 tablet (10 mg total) by mouth every 8 (eight) hours as needed for nausea. 60 tablet 1   pantoprazole  (PROTONIX ) 40 MG tablet Take 1 tablet (40 mg total) by mouth daily. 30 tablet 2   potassium chloride  (KLOR-CON  M) 10 MEQ tablet Take 1 tablet (10 mEq total) by mouth 3 (three)  times daily. 90 tablet 1   prochlorperazine  (COMPAZINE ) 10 MG tablet Take 1 tablet (10 mg total) by mouth every 6 (six) hours as needed for nausea or vomiting. 30 tablet 2   promethazine  (PHENERGAN ) 25 MG tablet Take 2 tablets (50 mg total) by mouth 2 (two) times daily as needed for nausea or vomiting. 90 tablet 1   scopolamine  (TRANSDERM-SCOP) 1 MG/3DAYS Place 1 patch (1.5 mg total) onto the skin every 3 (three) days. 10 patch 1   sucralfate  (CARAFATE ) 1 g tablet Take 1 tablet (1 g total) by mouth 4 (four) times daily. 120 tablet 1   zinc gluconate 50 MG tablet Take 50 mg by mouth daily.     No current facility-administered medications for this visit.   Facility-Administered Medications Ordered in Other Visits  Medication Dose Route Frequency Provider Last Rate Last Admin   0.9 %  sodium chloride  infusion   Intravenous Continuous [name], MD 500 mL/hr at 03/11/24 1057 New Bag at 03/11/24 1057   acetaminophen  (TYLENOL ) tablet 650 mg  650 mg Oral Once [name], MD       dexamethasone  (DECADRON ) injection 10 mg  10 mg Intravenous Once [name], MD       diphenhydrAMINE  (BENADRYL ) injection 25 mg  25 mg Intravenous Once [name], MD       famotidine  (PEPCID ) IVPB 20 mg premix  20 mg Intravenous Once [name], MD       PACLitaxel  (TAXOL ) 114 mg in sodium chloride  0.9 % 250 mL chemo infusion (</= 80mg /m2)  60 mg/m2 (Treatment Plan Recorded) Intravenous Once [name], MD       ramucirumab  (CYRAMZA ) 600 mg in sodium chloride  0.9 % 190 mL chemo infusion  8 mg/kg (Treatment Plan Recorded) Intravenous Once [name], MD       sodium chloride  flush (NS) 0.9 % injection 10 mL  10 mL Intracatheter PRN [name], MD        PHYSICAL EXAMINATION: ECOG PERFORMANCE STATUS: 2 - Symptomatic, <50% confined to bed  Vitals:   03/11/24 0957 03/11/24 0958  BP:  120/88  Pulse: (!) 109 (!) 106  Resp:    Temp:    SpO2: 99% 99%   Wt Readings from Last 3 Encounters:  03/11/24 151 lb 8 oz (68.7 kg)   02/26/24 154 lb (69.9 kg)  02/19/24 154 lb 8 oz (70.1 kg)     GENERAL:alert, no distress and comfortable SKIN: skin color, texture, turgor are normal, no rashes or significant lesions EYES: normal, Conjunctiva are pink and non-injected, sclera clear NECK: supple, thyroid  normal size, non-tender, without nodularity LYMPH:  no palpable lymphadenopathy in the cervical, axillary  LUNGS: clear to auscultation and percussion with normal breathing effort HEART: regular rate & rhythm and no murmurs and no lower extremity edema ABDOMEN:abdomen soft, non-tender and normal bowel sounds Musculoskeletal:no cyanosis of digits and no clubbing  NEURO: alert & oriented x 3 with fluent speech, no focal motor/sensory deficits  Physical Exam    LABORATORY DATA:  I have reviewed the data as listed    Latest  Ref Rng & Units 03/11/2024    9:27 AM 02/26/2024    8:46 AM 02/19/2024    7:25 AM  CBC  WBC 4.0 - 10.5 K/uL 3.0  2.5  2.6   Hemoglobin 12.0 - 15.0 g/dL 87.5  88.2  87.7   Hematocrit 36.0 - 46.0 % 36.4  33.6  34.6   Platelets 150 - 400 K/uL 408  360  383         Latest Ref Rng & Units 03/11/2024    9:27 AM 02/26/2024    8:46 AM 02/19/2024    7:25 AM  CMP  Glucose 70 - 99 mg/dL 886  886  883   BUN 6 - 20 mg/dL 8  8  11    Creatinine 0.44 - 1.00 mg/dL 9.52  9.51  9.48   Sodium 135 - 145 mmol/L 139  136  138   Potassium 3.5 - 5.1 mmol/L 3.1  3.2  3.5   Chloride 98 - 111 mmol/L 105  104  105   CO2 22 - 32 mmol/L 28  26  28    Calcium  8.9 - 10.3 mg/dL 9.2  9.1  9.5   Total Protein 6.5 - 8.1 g/dL 6.5  6.4  6.3   Total Bilirubin 0.0 - 1.2 mg/dL 0.4  0.5  0.4   Alkaline Phos 38 - 126 U/L 68  57  73   AST 15 - 41 U/L 17  17  22    ALT 0 - 44 U/L 7  9  10        RADIOGRAPHIC STUDIES: I have personally reviewed the radiological images as listed and agreed with the findings in the report. No results found.    Orders Placed This Encounter  Procedures   Amb Referral to Palliative Care     Referral Priority:   Routine    Referral Type:   Consultation    Number of Visits Requested:   1   Ambulatory referral to Physical Therapy    Referral Priority:   Routine    Referral Type:   Physical Medicine    Referral Reason:   Specialty Services Required    Requested Specialty:   Physical Therapy    Number of Visits Requested:   1   All questions were answered. The patient knows to call the clinic with any problems, questions or concerns. No barriers to learning was detected. The total time spent in the appointment was 55 minutes, including review of chart and various tests results, discussions about plan of care and coordination of care plan     Onita Mattock, MD 03/11/2024

## 2024-03-11 NOTE — Assessment & Plan Note (Signed)
 [id number] with peritoneal metastasis. 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   -she started first line chemo FLOT on 05/09/22 -She understands that chemotherapy is palliative, to prolong her life.  We are unlikely going to cure her cancer. -PD-L1 0-1%, very limited benefit from PD-L1 immunotherapy, FO revealed FGFR2 amplification and fusion (+), FGFR inhibitors can be considered in future, no other targeted therapy available  -I changed her chemo from FLOT to FOLFOX on 06/20/2022 -She is not able to return to work due to the cancer and treatment related symptoms.  -I subsequently changed her treatment to maintenance Xeloda  in early August 2024, she is tolerating well overall  -her NGS Caris showed positive Claudin 18.2, she is a candidate for zolbetuximab.  -Repeated EGD on March 21, 2023 showed residual gastric cancer.  We discussed option of changing her chemotherapy back to FOLFOX and add zolbetuximab.  -PET 06/03/2023 showed stable disease (no hypermetabolic disease outside stomach) -Patient developed recurrent abdominal pain, similar to the symptoms she had when she was diagnosed.  She agreed to change treatment back to FOLFOX, and add Zolbetuximab. She started on 07/09/2023. She tolerated first cycle poorly and had prolonged recovery. Oxaliplatin  was stop after cycle 1 due to poor tolerance. She continued 5-fu/LV and zolbe every 2 weeks  -CT 10/10/2023 showed stable disease - Unfortunately PET scan on December 06, 2023 showed worsening peritoneal metastasis.  I recommend change treatment to paclitaxel  and ramucirumab , she started on 12/17/23 -PET 03/02/2024 showed disease progression in stomack and nodes, but Signatera showed improved ctDNA

## 2024-03-12 ENCOUNTER — Ambulatory Visit: Attending: Hematology | Admitting: Physical Therapy

## 2024-03-12 ENCOUNTER — Other Ambulatory Visit (HOSPITAL_COMMUNITY): Payer: Self-pay

## 2024-03-12 ENCOUNTER — Encounter: Payer: Self-pay | Admitting: Physical Therapy

## 2024-03-12 ENCOUNTER — Other Ambulatory Visit: Payer: Self-pay

## 2024-03-12 DIAGNOSIS — M6281 Muscle weakness (generalized): Secondary | ICD-10-CM | POA: Insufficient documentation

## 2024-03-12 DIAGNOSIS — R2689 Other abnormalities of gait and mobility: Secondary | ICD-10-CM | POA: Diagnosis present

## 2024-03-12 DIAGNOSIS — R208 Other disturbances of skin sensation: Secondary | ICD-10-CM | POA: Diagnosis present

## 2024-03-12 DIAGNOSIS — C162 Malignant neoplasm of body of stomach: Secondary | ICD-10-CM | POA: Diagnosis present

## 2024-03-12 NOTE — Therapy (Addendum)
 OUTPATIENT PHYSICAL THERAPY  LOWER EXTREMITY ONCOLOGY EVALUATION  Patient Name: Sheryl [name] MRN: [id number] DOB:10-16-1965, 58 y.o., female Today's Date: 03/12/2024  END OF SESSION:  PT End of Session - 03/12/24 1310     Visit Number 1    Number of Visits 9    Date for Recertification  04/09/24    PT Start Time 1202    PT Stop Time 1310    PT Time Calculation (min) 68 min    Activity Tolerance Patient tolerated treatment well    Behavior During Therapy [facility] for tasks assessed/performed          Past Medical History:  Diagnosis Date   Blood transfusion without reported diagnosis    had transfusion with hysterectomy   Cataract    Colon polyps 2012   Diabetes (HCC) 03/13/2021   Diabetes (HCC) 05/21/2019   Family history of breast cancer    Family history of pancreatic cancer    Family history of stomach cancer    Fibroid    gastric ca 03/2022   GERD (gastroesophageal reflux disease)    H/O blood clots    History of hysterectomy    fibroids and heavy cycles   Hypertension    Past Surgical History:  Procedure Laterality Date   ABDOMINAL HYSTERECTOMY     BIOPSY  04/19/2022   Procedure: BIOPSY;  Surgeon: Wilhelmenia Aloha Raddle., MD;  Location: [facility];  Service: Gastroenterology;;   BIOPSY  03/21/2023   Procedure: BIOPSY;  Surgeon: Wilhelmenia Aloha Raddle., MD;  Location: [facility];  Service: Gastroenterology;;   COLONOSCOPY     ESOPHAGOGASTRODUODENOSCOPY (EGD) WITH PROPOFOL  N/A 04/19/2022   Procedure: ESOPHAGOGASTRODUODENOSCOPY (EGD) WITH PROPOFOL ;  Surgeon: Wilhelmenia Aloha Raddle., MD;  Location: [facility];  Service: Gastroenterology;  Laterality: N/A;   ESOPHAGOGASTRODUODENOSCOPY (EGD) WITH PROPOFOL  N/A 03/21/2023   Procedure: ESOPHAGOGASTRODUODENOSCOPY (EGD) WITH PROPOFOL ;  Surgeon: Wilhelmenia Aloha Raddle., MD;  Location: [facility];  Service: Gastroenterology;  Laterality: N/A;   EUS N/A 04/19/2022   Procedure: UPPER ENDOSCOPIC ULTRASOUND (EUS)  RADIAL;  Surgeon: Wilhelmenia Aloha Raddle., MD;  Location: [facility];  Service: Gastroenterology;  Laterality: N/A;   EXCISION OF SKIN TAG  05/03/2022   Procedure: EXCISION OF CHEST WALL SKIN LESION;  Surgeon: Dasie Leonor CROME, MD;  Location: [facility];  Service: General;;   LAPAROSCOPY N/A 05/03/2022   Procedure: LAPAROSCOPY DIAGNOSTIC WITH PERITONEAL WASHINGS;  Surgeon: Dasie Leonor CROME, MD;  Location: [facility];  Service: General;  Laterality: N/A;   POLYPECTOMY  04/19/2022   Procedure: POLYPECTOMY;  Surgeon: Wilhelmenia Aloha Raddle., MD;  Location: [facility];  Service: Gastroenterology;;   PORTACATH PLACEMENT N/A 05/03/2022   Procedure: INSERTION PORT-A-CATH WITH ULTRASOUND GUIDANCE;  Surgeon: Dasie Leonor CROME, MD;  Location: [facility];  Service: General;  Laterality: N/A;   UPPER GASTROINTESTINAL ENDOSCOPY     Patient Active Problem List   Diagnosis Date Noted   Anaphylaxis 09/10/2023   Need for immunization against influenza 03/13/2023   Gastroesophageal reflux disease with esophagitis without hemorrhage 03/13/2023   Port-A-Cath in place 05/23/2022   Genetic testing 05/21/2022   Gastric cancer (HCC) 04/13/2022   Hypertension 01/24/2022   Type II diabetes mellitus with manifestations (HCC) 01/24/2022   Need for vaccination 01/24/2022   Gastroesophageal reflux disease without esophagitis 01/23/2022   Hyperlipidemia LDL goal <100 01/23/2022   Diuretic-induced hypokalemia 01/23/2022    PCP: Debby Molt, MD  REFERRING PROVIDER: [name], MD  REFERRING DIAG: C16.2 (ICD-10-CM) - 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 My R foot has a mind of its own. It just twisted sideways. The neuropathy part has gone up to the ankle on my R foot. The neuropathy started in Jan 2024 but has gotten worse on this new chemo which I started 3 months ago. I will start the oral chemo on Dec 1 in hopes to help the neuropathy. Due to stomach pain pt is only able to drink about 8 oz fluid/day.   PERTINENT HISTORY: Metastatic gastric cancer with peritoneal metastasis Disease progression on recent PET scan with increased activity in the stomach and peritoneal nodule. Previous treatments included chemotherapy and antibody therapy with limited response. Current treatment with paclitaxel  causing significant neuropathy.Will be switching to oral chemo on Dec 1 in hopes of decreasing neuropathy  PAIN:  Are you having pain? Yes NPRS scale: 8/10 Pain location: bilateral feet Pain orientation: Bilateral  PAIN TYPE: tingling and numbness Pain description: constant  Aggravating factors: touching the foot (pain increases to 20), walking  Relieving factors: nothing  PRECAUTIONS: Pt reports when she stands for a long time she gets light headed 5-10 min, light headed with bending forward  RED FLAGS: None   WEIGHT BEARING RESTRICTIONS: No  FALLS:  Has patient fallen in last 6 months? Yes. Number of falls 1 (end of Sept 2025) - reports foot turned sideway and she fell on the floor    LIVING ENVIRONMENT: Lives with: lives with their family Lives in: House/apartment Stairs: No;  Has following equipment at home: Single point cane and Environmental Consultant - 2 wheeled, unable to drive  OCCUPATION: not working/ disability  LEISURE: does not exercise  PRIOR LEVEL OF FUNCTION: Independent with household mobility with device and Independent with community  mobility with device uses wall for balance in the home, makes small meals but would be unable to carry groceries  PATIENT GOALS: strengthen the feet to be able to drive again   OBJECTIVE: Note: Objective measures were completed at Evaluation unless otherwise noted.  COGNITION: Overall cognitive status: Within functional limits for tasks  assessed   SENSATION: Unable to formally assess due to extreme sensitivity in bilateral feet  POSTURE: forward head, rounded shoulders  LOWER EXTREMITY STRENGTH:  MMT Right eval  Hip flexion 2-/5  Hip extension   Hip abduction 2-/5  Hip adduction   Hip internal rotation   Hip external rotation   Knee flexion 3+/5  Knee extension 3+/5  Ankle dorsiflexion 1/5  Ankle plantarflexion   Ankle inversion   Ankle eversion   Great toe extension    (Blank rows = not tested)  MMT LEFT eval  Hip flexion 3/5  Hip extension   Hip abduction 3+/5  Hip adduction   Hip internal rotation   Hip external rotation   Knee flexion 4/5  Knee extension 3+/5  Ankle dorsiflexion 2+/5  Ankle plantarflexion   Ankle inversion   Ankle eversion   Great toe extension     (Blank rows = not tested)  FUNCTIONAL TESTS:  Lower Extremity Functional Scale:  Lower Extremity Functional Score: 11 / 80 = 13.8 %  An LEFS score is interpreted on a scale of 0 to 80, where a higher score indicates better lower extremity function. Scores between 61 and 80 typically represent minimal to normal limitation, scores from 41 to 60 suggest mild to moderate limitation, and scores below 40 indicate moderate to severe limitation. A score of 0 means a person is unable to perform most activities, while a score of 80 means there is no difficulty.    30 sec sit to stand: Unable   GAIT: Distance walked: 10 ft Assistive device utilized: Single point cane Level of assistance: SBA Comments: slow gait speed, shortened step length, decreased foot clearance, decreased hip flexion on R                                                                                                                             TREATMENT DATE:  03/12/24- demonstrated seated exercises to pt for pt to begin doing at home - marching, knee extension and ankle DF (for R do in side lying without gravity)   PATIENT EDUCATION:  Education details: seated exercises (hip flexion, knee extension, ankle DF), in side lying work on R ankle DF without gravity, use walker instead of cane to decrease fall risk Person educated: Patient Education method: Medical Illustrator Education comprehension: verbalized understanding  HOME EXERCISE PROGRAM: Seated hip flexion, knee extension, L ankle DF In side lying work on R ankle DF   ASSESSMENT:  CLINICAL IMPRESSION: Patient is a 58 y.o. female who was seen today for physical therapy evaluation and treatment for peripheral neuropathy and weakness that began with chemo for treatment of metastatic stomach cancer. Pt reports one day she attempted to get out of the car and her R foot was numb and she was unable to raise it. Manual muscle testing today revealed increased weakness in R hip with pt demonstrate 1/5 strength in R ankle DF and 2-/5 in R hip  abduction and flexion. She is unable to stand from a chair without use of UEs. She reports she spends most of her time in bed due to side effects from the chemo such as frequent nausea and vomiting and now dehydration. She is only able to stand for 5 to 10 min before becoming light headed. She reports drinking 8 oz fluid all day due to inability to tolerate more without vomiting. She does get fluid at the cancer center once a week. Will send a message to her oncologist regarding hip weakness since pt was unaware of this and her it is much weaker than the L side. Pt was educated to start using a walk instead of the cane to help decrease risk of falls. Pt would benefit from skilled PT services to increase bilateral LE strength,  improve R ankle DF and improve balance and gait to decrease fall risk and improve pt's mobility.   OBJECTIVE IMPAIRMENTS: Abnormal gait, decreased activity tolerance, decreased balance, decreased coordination, decreased knowledge of condition, decreased knowledge of use of DME, decreased mobility, difficulty walking, decreased ROM, decreased strength, impaired sensation, postural dysfunction, and pain.   ACTIVITY LIMITATIONS: bending, standing, squatting, sleeping, stairs, transfers, bed mobility, bathing, dressing, and caring for others  PARTICIPATION LIMITATIONS: meal prep, cleaning, laundry, driving, shopping, and community activity  PERSONAL FACTORS: Fitness and Time since onset of injury/illness/exacerbation are also affecting patient's functional outcome.   REHAB POTENTIAL: Good  CLINICAL DECISION MAKING: Unstable/unpredictable  EVALUATION COMPLEXITY: High   GOALS: Goals reviewed with patient? Yes  SHORT TERM GOALS: Target date: 03/30/24  Pt will demonstrate 2+/5 R hip flexion strength to decrease fall risk. Baseline: Goal status: INITIAL  2.  Pt will obtain an AFO to help with foot drop to decrease fall risk. Baseline:  Goal status: INITIAL  3.  Pt will demonstrate 2-/5 R ankle DF strength to decrease fall risk.  Baseline:  Goal status: INITIAL   LONG TERM GOALS: Target date: 04/09/24  Pt will report the numbness and tingling in her feet have improved by at least 40% to allow improved function. Baseline:  Goal status: INITIAL  2.  Pt will demonstrate 3/5 R hip DF strength to decrease fall risk and increase independence with ambulation. Baseline:  Goal status: INITIAL  3.  Pt will demonstrate 3/5 R hip flexion strength to help decrease fall risk when ambulating. Baseline:  Goal status: INITIAL  4.  Pt will be independent in a home exercise program for continued stretching and strengthening.  Baseline:  Goal status: INITIAL   PLAN:  PT FREQUENCY:  2x/week  PT DURATION: 4 weeks  PLANNED INTERVENTIONS: [id number]- PT Re-evaluation, 97750- Physical Performance Testing, 97110-Therapeutic exercises, 97530- Therapeutic activity, 97112- Neuromuscular re-education, 97535- Self Care, [id number]- Manual therapy, [phone]- Gait training, [phone]- Orthotic Initial, [phone]- Orthotic/Prosthetic subsequent, Patient/Family education, Balance training, Joint mobilization, Therapeutic exercises, Therapeutic activity, Neuromuscular re-education, Gait training, and Self Care  PLAN FOR NEXT SESSION: begin gentle LE strengthening especially R ankle and R hip, message sent to Dr. Lanny regarding hip weakness, gait training, AFO?   [address] [place], [place] 03/12/2024, 5:02 PM

## 2024-03-13 ENCOUNTER — Other Ambulatory Visit: Payer: Self-pay

## 2024-03-13 ENCOUNTER — Telehealth: Payer: Self-pay

## 2024-03-13 ENCOUNTER — Other Ambulatory Visit (HOSPITAL_COMMUNITY): Payer: Self-pay

## 2024-03-13 DIAGNOSIS — R2990 Unspecified symptoms and signs involving the nervous system: Secondary | ICD-10-CM

## 2024-03-13 DIAGNOSIS — C162 Malignant neoplasm of body of stomach: Secondary | ICD-10-CM

## 2024-03-13 NOTE — Telephone Encounter (Addendum)
 Order for MRI off brain placed and Central Scheduling contacted to get pt scheduled.      ----- Message from Onita Mattock sent at 03/12/2024 10:30 PM EST ----- Regarding: RE: R hip weakness Florina,  Thanks for your message. I think it's reasonable to rule out stroke.   Anders armin Rush, please order a brain MRI w wo contrast and get it scheduled asap, thanks   Onita Mattock ----- Message ----- From: Lanis Carbon, Florina CROME, PT Sent: 03/12/2024   5:13 PM EST To: Onita Mattock, MD Subject: R hip weakness                                 Good evening, I just did an eval on this patient. She has foot drop on her R but also has very weak R hip strength. She is unable to lift her knee when sitting and unable to raise her leg when side lying. Her L hip is weak but nothing like the R side. 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 I just wanted to let you know and see if you had any ideas. She told me she felt like she had a stroke, because she said she was fine and then went to get out of the car and her foot was completely numb and she had foot drop. I did not know if she had shared that info. Thank you so much, [employer]

## 2024-03-17 ENCOUNTER — Other Ambulatory Visit (HOSPITAL_COMMUNITY): Payer: Self-pay

## 2024-03-17 ENCOUNTER — Telehealth: Payer: Self-pay

## 2024-03-17 ENCOUNTER — Ambulatory Visit: Admitting: Internal Medicine

## 2024-03-17 ENCOUNTER — Ambulatory Visit: Payer: Self-pay | Admitting: Internal Medicine

## 2024-03-17 ENCOUNTER — Encounter: Payer: Self-pay | Admitting: Internal Medicine

## 2024-03-17 VITALS — BP 124/86 | HR 94 | Temp 97.9°F | Ht 68.0 in | Wt 151.0 lb

## 2024-03-17 DIAGNOSIS — Z23 Encounter for immunization: Secondary | ICD-10-CM

## 2024-03-17 DIAGNOSIS — E1140 Type 2 diabetes mellitus with diabetic neuropathy, unspecified: Secondary | ICD-10-CM | POA: Diagnosis not present

## 2024-03-17 DIAGNOSIS — I10 Essential (primary) hypertension: Secondary | ICD-10-CM | POA: Diagnosis not present

## 2024-03-17 DIAGNOSIS — C162 Malignant neoplasm of body of stomach: Secondary | ICD-10-CM

## 2024-03-17 DIAGNOSIS — Z0001 Encounter for general adult medical examination with abnormal findings: Secondary | ICD-10-CM | POA: Insufficient documentation

## 2024-03-17 LAB — HEMOGLOBIN A1C: Hgb A1c MFr Bld: 5 % (ref 4.6–6.5)

## 2024-03-17 MED ORDER — AMLODIPINE BESYLATE 10 MG PO TABS
10.0000 mg | ORAL_TABLET | Freq: Every day | ORAL | 1 refills | Status: DC
  Filled 2024-03-17: qty 90, 90d supply, fill #0

## 2024-03-17 NOTE — Telephone Encounter (Signed)
 I spoke with patient to reschedule palliative care appointment that was on 03/30/2024. Patient stated she would like to hold off on scheduling palliative care appointment until next year.[email or web address]

## 2024-03-17 NOTE — Progress Notes (Signed)
 Subjective:  Patient ID: Sheryl [name], female    DOB: 1965-10-25  Age: 58 y.o. MRN: [id number]  CC: Hypertension, Annual Exam, Diabetes, and Hyperlipidemia   HPI Jordane Hisle Liberto presents for a CPX and f/up ---  Discussed the use of AI scribe software for clinical note transcription with the patient, who gave verbal consent to proceed.  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  Her neuropathy symptoms began with her first chemotherapy regimen and have worsened with her current treatment. The neuropathy is sometimes painful and now also causes numbness. She experienced a fall at the end of September, resulting in a busted lip and knee, due to twisting her right side and losing balance.  She is not currently taking any specific medication for neuropathy but uses hydrocodone  at night, which helps her sleep. She is also taking a medication for nausea and vomiting that she uses day and night, which makes her sleepy.  No blood sugar symptoms such as excessive thirst or urination, and no drastic changes in weight or appetite. She experiences some bleeding when vomiting but denies any trouble breathing. She reports pain in her feet when walking and keeps compression socks on due to discomfort, especially at night. She has started physical therapy recently, with treatment beginning on the second of this month. She mentions a weakness from her hip to her foot.  No recent episodes of vomiting blood and no current abdominal pain. She has dry feet and uses lotion to manage this.   Outpatient Medications Prior to Visit  Medication Sig Dispense Refill   acetaminophen  (TYLENOL ) 500 MG tablet Take 2 tablets (1,000 mg total) by mouth every 8 (eight) hours as needed (pain). 30 tablet 1   b complex vitamins capsule Take 1 capsule by mouth daily.     calcium -vitamin D (OSCAL WITH D) 500-5 MG-MCG tablet  Take 2 tablets by mouth 2 (two) times daily.     famotidine  (PEPCID ) 20 MG tablet Take 1 tablet (20 mg total) by mouth 2 (two) times daily. 60 tablet 2   HYDROcodone -acetaminophen  (NORCO/VICODIN) 5-325 MG tablet Take 1 tablet by mouth every 12 (twelve) hours as needed for moderate pain (pain score 4-6). 60 tablet 0   lidocaine -prilocaine  (EMLA ) cream Apply 1 Application topically as needed. 30 g 1   metoCLOPramide  (REGLAN ) 10 MG tablet Take 1 tablet (10 mg total) by mouth every 8 (eight) hours as needed for nausea. 60 tablet 1   pantoprazole  (PROTONIX ) 40 MG tablet Take 1 tablet (40 mg total) by mouth daily. 30 tablet 2   potassium chloride  (KLOR-CON  M) 10 MEQ tablet Take 1 tablet (10 mEq total) by mouth 3 (three) times daily. 90 tablet 1   prochlorperazine  (COMPAZINE ) 10 MG tablet Take 1 tablet (10 mg total) by mouth every 6 (six) hours as needed for nausea or vomiting. 30 tablet 2   promethazine  (PHENERGAN ) 25 MG tablet Take 2 tablets (50 mg total) by mouth 2 (two) times daily as needed for nausea or vomiting. 90 tablet 1   scopolamine  (TRANSDERM-SCOP) 1 MG/3DAYS Place 1 patch (1.5 mg total) onto the skin every 3 (three) days. 10 patch 1   sucralfate  (CARAFATE ) 1 g tablet Take 1 tablet (1 g total) by mouth 4 (four) times daily. 120 tablet 1   zinc gluconate 50 MG tablet Take 50 mg by mouth daily.     amLODipine  (NORVASC ) 10 MG tablet Take  1 tablet (10 mg total) by mouth daily. 90 tablet 0   No facility-administered medications prior to visit.    ROS Review of Systems  Constitutional:  Positive for appetite change and fatigue. Negative for chills, diaphoresis and fever.  HENT:  Positive for trouble swallowing.   Eyes: Negative.   Respiratory: Negative.  Negative for cough, chest tightness, shortness of breath and wheezing.   Cardiovascular:  Negative for chest pain, palpitations and leg swelling.  Gastrointestinal:  Positive for nausea and vomiting. Negative for abdominal pain, constipation  and diarrhea.  Endocrine: Negative.   Genitourinary: Negative.  Negative for difficulty urinating, dysuria and hematuria.  Musculoskeletal: Negative.  Negative for arthralgias and myalgias.  Skin: Negative.   Neurological:  Positive for numbness. Negative for dizziness, weakness and light-headedness.  Hematological:  Negative for adenopathy. Does not bruise/bleed easily.  Psychiatric/Behavioral: Negative.      Objective:  BP 124/86 (BP Location: Left Arm, Patient Position: Sitting)   Pulse 94   Temp 97.9 F (36.6 C) (Temporal)   Ht 5' 8 (1.727 m)   Wt 151 lb (68.5 kg)   LMP 06/29/2010   SpO2 98%   BMI 22.96 kg/m   BP Readings from Last 3 Encounters:  03/17/24 124/86  03/11/24 120/88  03/02/24 124/82    Wt Readings from Last 3 Encounters:  03/17/24 151 lb (68.5 kg)  03/11/24 151 lb 8 oz (68.7 kg)  02/26/24 154 lb (69.9 kg)    Physical Exam Vitals reviewed.  Constitutional:      General: She is not in acute distress.    Appearance: She is ill-appearing. She is not toxic-appearing or diaphoretic.  HENT:     Nose: Nose normal.     Mouth/Throat:     Mouth: Mucous membranes are moist.  Eyes:     General: No scleral icterus.    Conjunctiva/sclera: Conjunctivae normal.  Cardiovascular:     Rate and Rhythm: Normal rate and regular rhythm.     Heart sounds: No murmur heard.    No friction rub. No gallop.  Pulmonary:     Effort: Pulmonary effort is normal.     Breath sounds: No stridor. No wheezing, rhonchi or rales.  Abdominal:     General: Abdomen is flat. Bowel sounds are normal.     Palpations: There is no mass.     Tenderness: There is no abdominal tenderness. There is no guarding.     Hernia: No hernia is present.  Musculoskeletal:        General: Normal range of motion.     Cervical back: Neck supple.     Right lower leg: No edema.     Left lower leg: No edema.  Lymphadenopathy:     Cervical: No cervical adenopathy.  Skin:    General: Skin is warm and  dry.  Neurological:     General: No focal deficit present.     Mental Status: She is alert. Mental status is at baseline.  Psychiatric:        Mood and Affect: Mood normal.        Behavior: Behavior normal.     Lab Results  Component Value Date   WBC 3.0 (L) 03/11/2024   HGB 12.4 03/11/2024   HCT 36.4 03/11/2024   PLT 408 (H) 03/11/2024   GLUCOSE 113 (H) 03/11/2024   CHOL 179 12/20/2020   TRIG 90 12/20/2020   HDL 45 12/20/2020   LDLCALC 117 (H) 12/20/2020   ALT 7 03/11/2024  AST 17 03/11/2024   NA 139 03/11/2024   K 3.1 (L) 03/11/2024   CL 105 03/11/2024   CREATININE 0.47 03/11/2024   BUN 8 03/11/2024   CO2 28 03/11/2024   TSH 1.960 02/12/2024   INR 1.01 09/18/2010   HGBA1C 5.0 03/17/2024    NM PET Image Restag (PS) Skull Base To Thigh Result Date: 03/02/2024 EXAM: PET AND CT SKULL BASE TO MID THIGH 03/02/2024 09:00:41 AM TECHNIQUE: RADIOPHARMACEUTICAL: 7.65 mCi F-18 FDG Uptake time 60 minutes. Glucose level  mg/dl. Blood pool SUV 2.5. PET imaging was acquired from the base of the skull to the mid thighs. Non-contrast enhanced computed tomography was obtained for attenuation correction and anatomic localization. COMPARISON: 12/06/2023 CLINICAL HISTORY: Gastric cancer, assess treatment response. FINDINGS: HEAD AND NECK: No metabolically active cervical lymphadenopathy. CHEST: Right port-a-cath tip in lower SVC. No metabolically active pulmonary nodules. No metabolically active lymphadenopathy. ABDOMEN AND PELVIS: Wall thickening again observed with maximum SUV 7.4 in the proximal stomach (formerly 6.5). Soft tissue nodules or lymph nodes adjacent to the gastric fundus are hypermetabolic; a 1.1 cm short axis lesion on image 107 series 4 has maximum SUV of 11.9 (formerly 2.2). A nodule lateral to the splenic flexure measuring 1.0 cm in short axis has a maximum SUV of 8.2 (formerly 1.1 cm with maximum SUV 6.5). Overall the hypermetabolic activity and size of these nodules is generally  increased. Large photopenic left kidney lower pole cyst warrants no further imaging workup. BONES AND SOFT TISSUE: No abnormal FDG activity localizes to the bones. No metabolically active aggressive osseous lesion. IMPRESSION: 1. Progression of disease with increased gastric hypermetabolic activity and generally increased hypermetabolic activity and size of nodules/lymph nodes adjacent to the gastric fundus and lateral to the splenic flexure. Electronically signed by: Ryan Salvage MD 03/02/2024 04:50 PM EST RP Workstation: [id number]    Assessment & Plan:   Malignant neoplasm of body of stomach (HCC)- Receiving chemotherapy.  Hypertension, unspecified type- BP is well controlled. -     amLODIPine  Besylate; Take 1 tablet (10 mg total) by mouth daily.  Dispense: 90 tablet; Refill: 1  Type 2 diabetes mellitus with diabetic neuropathy, without long-term current use of insulin  (HCC)- Blod sugar is very well controlled. -     HM Diabetes Foot Exam -     Hemoglobin A1c; Future  Immunization due -     Flu vaccine trivalent PF, 6mos and older(Flulaval,Afluria,Fluarix,Fluzone)     Follow-up: Return in about 6 months (around 09/14/2024).  Debby Molt, MD

## 2024-03-17 NOTE — Patient Instructions (Signed)

## 2024-03-17 NOTE — Telephone Encounter (Signed)
 Pt declined palliative services at this time, referral closed. Palliative services available for patient if needed or desired with re-referral.

## 2024-03-18 ENCOUNTER — Inpatient Hospital Stay: Admitting: Hematology

## 2024-03-18 ENCOUNTER — Inpatient Hospital Stay

## 2024-03-19 ENCOUNTER — Other Ambulatory Visit: Payer: Self-pay

## 2024-03-24 ENCOUNTER — Telehealth: Payer: Self-pay

## 2024-03-24 NOTE — Telephone Encounter (Signed)
 Spoke with the pt daughter regarding her FMLA forms being completed. I emailed the daughter forms to the email address that she provided.  The pt daughter (Crystalyn) request that a physical copy be mailed to the address on file for her mother.No questions or concerns to be noted at this time.

## 2024-03-25 ENCOUNTER — Telehealth: Payer: Self-pay

## 2024-03-25 ENCOUNTER — Inpatient Hospital Stay: Admitting: Hematology

## 2024-03-25 ENCOUNTER — Inpatient Hospital Stay

## 2024-03-25 NOTE — Telephone Encounter (Signed)
 Spoke with pt via telephone to explain to pt that the reason for the MRI Dr Lanny ordered.  Stated that the MRI was order d/t pt's c/o LLE weakness on 1 side.  Pt verbalized understanding and had no further question or concerns.

## 2024-03-27 ENCOUNTER — Other Ambulatory Visit: Payer: Self-pay

## 2024-03-27 ENCOUNTER — Ambulatory Visit (HOSPITAL_COMMUNITY)
Admission: RE | Admit: 2024-03-27 | Discharge: 2024-03-27 | Disposition: A | Source: Ambulatory Visit | Attending: Hematology

## 2024-03-27 DIAGNOSIS — R2990 Unspecified symptoms and signs involving the nervous system: Secondary | ICD-10-CM | POA: Insufficient documentation

## 2024-03-27 DIAGNOSIS — C162 Malignant neoplasm of body of stomach: Secondary | ICD-10-CM | POA: Insufficient documentation

## 2024-03-27 MED ORDER — GADOBUTROL 1 MMOL/ML IV SOLN
7.0000 mL | Freq: Once | INTRAVENOUS | Status: AC | PRN
  Administered 2024-03-27: 7 mL via INTRAVENOUS

## 2024-03-28 ENCOUNTER — Other Ambulatory Visit (HOSPITAL_COMMUNITY): Payer: Self-pay

## 2024-03-29 NOTE — Assessment & Plan Note (Signed)
 [id number] with peritoneal metastasis. 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   -she started first line chemo FLOT on 05/09/22 -She understands that chemotherapy is palliative, to prolong her life.  We are unlikely going to cure her cancer. -PD-L1 0-1%, very limited benefit from PD-L1 immunotherapy, FO revealed FGFR2 amplification and fusion (+), FGFR inhibitors can be considered in future, no other targeted therapy available  -I changed her chemo from FLOT to FOLFOX on 06/20/2022 -She is not able to return to work due to the cancer and treatment related symptoms.  -I subsequently changed her treatment to maintenance Xeloda  in early August 2024, she is tolerating well overall  -her NGS Caris showed positive Claudin 18.2, she is a candidate for zolbetuximab.  -Repeated EGD on March 21, 2023 showed residual gastric cancer.  We discussed option of changing her chemotherapy back to FOLFOX and add zolbetuximab.  -PET 06/03/2023 showed stable disease (no hypermetabolic disease outside stomach) -Patient developed recurrent abdominal pain, similar to the symptoms she had when she was diagnosed.  She agreed to change treatment back to FOLFOX, and add Zolbetuximab. She started on 07/09/2023. She tolerated first cycle poorly and had prolonged recovery. Oxaliplatin  was stop after cycle 1 due to poor tolerance. She continued 5-fu/LV and zolbe every 2 weeks  -CT 10/10/2023 showed stable disease - Unfortunately PET scan on December 06, 2023 showed worsening peritoneal metastasis.  I recommend change treatment to paclitaxel  and ramucirumab , she started on 12/17/23 -PET 03/02/2024 showed disease progression in stomack and nodes, but Signatera showed improved ctDNA. -chemo changed to [name] on 03/30/2024

## 2024-03-30 ENCOUNTER — Inpatient Hospital Stay: Attending: Physician Assistant

## 2024-03-30 ENCOUNTER — Telehealth: Payer: Self-pay | Admitting: Pharmacist

## 2024-03-30 ENCOUNTER — Telehealth: Payer: Self-pay

## 2024-03-30 ENCOUNTER — Other Ambulatory Visit: Payer: Self-pay

## 2024-03-30 ENCOUNTER — Inpatient Hospital Stay: Attending: Physician Assistant | Admitting: Hematology

## 2024-03-30 ENCOUNTER — Inpatient Hospital Stay

## 2024-03-30 ENCOUNTER — Encounter: Payer: Self-pay | Admitting: Hematology

## 2024-03-30 ENCOUNTER — Other Ambulatory Visit (HOSPITAL_COMMUNITY): Payer: Self-pay

## 2024-03-30 VITALS — BP 118/80 | HR 98 | Temp 97.7°F | Resp 16 | Ht 68.0 in | Wt 149.8 lb

## 2024-03-30 DIAGNOSIS — C162 Malignant neoplasm of body of stomach: Secondary | ICD-10-CM | POA: Insufficient documentation

## 2024-03-30 DIAGNOSIS — C786 Secondary malignant neoplasm of retroperitoneum and peritoneum: Secondary | ICD-10-CM | POA: Insufficient documentation

## 2024-03-30 LAB — CMP (CANCER CENTER ONLY)
ALT: 9 U/L (ref 0–44)
AST: 24 U/L (ref 15–41)
Albumin: 3.8 g/dL (ref 3.5–5.0)
Alkaline Phosphatase: 71 U/L (ref 38–126)
Anion gap: 9 (ref 5–15)
BUN: 9 mg/dL (ref 6–20)
CO2: 26 mmol/L (ref 22–32)
Calcium: 9.2 mg/dL (ref 8.9–10.3)
Chloride: 106 mmol/L (ref 98–111)
Creatinine: 0.49 mg/dL (ref 0.44–1.00)
GFR, Estimated: >60 mL/min (ref >60)
Glucose, Bld: 93 mg/dL (ref 70–99)
Potassium: 3.5 mmol/L (ref 3.5–5.1)
Sodium: 141 mmol/L (ref 135–145)
Total Bilirubin: 0.3 mg/dL (ref 0.0–1.2)
Total Protein: 6.3 g/dL — ABNORMAL LOW (ref 6.5–8.1)

## 2024-03-30 LAB — CBC WITH DIFFERENTIAL (CANCER CENTER ONLY)
Abs Immature Granulocytes: 0 K/uL (ref 0.00–0.07)
Basophils Absolute: 0 K/uL (ref 0.0–0.1)
Basophils Relative: 1 %
Eosinophils Absolute: 0.1 K/uL (ref 0.0–0.5)
Eosinophils Relative: 6 %
HCT: 35.6 % — ABNORMAL LOW (ref 36.0–46.0)
Hemoglobin: 12 g/dL (ref 12.0–15.0)
Immature Granulocytes: 0 %
Lymphocytes Relative: 26 %
Lymphs Abs: 0.6 K/uL — ABNORMAL LOW (ref 0.7–4.0)
MCH: 27.8 pg (ref 26.0–34.0)
MCHC: 33.7 g/dL (ref 30.0–36.0)
MCV: 82.4 fL (ref 80.0–100.0)
Monocytes Absolute: 0.3 K/uL (ref 0.1–1.0)
Monocytes Relative: 15 %
Neutro Abs: 1.2 K/uL — ABNORMAL LOW (ref 1.7–7.7)
Neutrophils Relative %: 52 %
Platelet Count: 335 K/uL (ref 150–400)
RBC: 4.32 MIL/uL (ref 3.87–5.11)
RDW: 16 % — ABNORMAL HIGH (ref 11.5–15.5)
WBC Count: 2.3 K/uL — ABNORMAL LOW (ref 4.0–10.5)
nRBC: 0 % (ref 0.0–0.2)

## 2024-03-30 LAB — GENETIC SCREENING ORDER

## 2024-03-30 MED ORDER — LONSURF 20-8.19 MG PO TABS
35.0000 mg/m2 | ORAL_TABLET | Freq: Two times a day (BID) | ORAL | 1 refills | Status: DC
  Filled 2024-03-30: qty 60, 28d supply, fill #0
  Filled 2024-04-17: qty 60, 28d supply, fill #1

## 2024-03-30 NOTE — Telephone Encounter (Signed)
 Oral Oncology Patient Advocate Encounter  After completing a benefits investigation, prior authorization for [name] is not required at this time through [facility].  Cost Exceeds max has been put in place by [facility]   Patient's copay is $4.00.      Charlott Hamilton,  CPhT-Adv  she/her/hers [facility]  [facility] Pharmacy Technician Patient Advocate Specialist III [facility] Phone: [phone]  Fax: [phone] [email or web address]

## 2024-03-30 NOTE — Progress Notes (Signed)
 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   -she started first line chemo FLOT on 05/09/22 -She understands that chemotherapy is palliative, to prolong her life.  We are unlikely going to cure her cancer. -PD-L1 0-1%, very limited benefit from PD-L1 immunotherapy, FO revealed FGFR2 amplification and fusion (+), FGFR inhibitors can be considered in future, no other targeted therapy available  -I changed her chemo from FLOT to FOLFOX on 06/20/2022 -She is not able to return to work due to the cancer and treatment related symptoms.  -I subsequently changed her treatment to maintenance Xeloda  in early August 2024, she is tolerating well overall  -her NGS Caris showed positive Claudin 18.2, she is a candidate for zolbetuximab.  -Repeated EGD on March 21, 2023 showed residual gastric cancer.  We discussed option of changing her chemotherapy back to FOLFOX and add zolbetuximab.  -PET 06/03/2023 showed stable disease (no hypermetabolic disease outside stomach) -Patient developed recurrent abdominal pain, similar to the symptoms she had when she was diagnosed.  She agreed to change treatment back to FOLFOX, and add Zolbetuximab. She started on 07/09/2023. She tolerated first cycle poorly and had prolonged recovery. Oxaliplatin  was stop after cycle 1 due to poor tolerance. She continued 5-fu/LV and zolbe every 2  weeks  -CT 10/10/2023 showed stable disease - Unfortunately PET scan on December 06, 2023 showed worsening peritoneal metastasis.  I recommend change treatment to paclitaxel  and ramucirumab , she started on 12/17/23 -PET 03/02/2024 showed disease progression in stomack and nodes, but Signatera showed improved ctDNA. -chemo changed to [name] on 03/30/2024  Assessment & Plan Metastatic gastric cancer Managed with oral chemotherapy. Recent brain MRI showed no stroke or cancer. Current chemotherapy regimen is a traditional agent with less skin problems compared to Xeloda . Potential side effects include fatigue, mild stomach upset, diarrhea, and low blood counts. Blood counts are stable except for slightly low white blood cell count, which is acceptable for oral chemotherapy. - Prescribed oral chemotherapy (Long Serve) 3 tablets twice a day for 5 days on, 9 days off. - Coordinated with pharmacy to ensure availability of medication. - Monitor blood counts and kidney function every two weeks initially, then monthly if stable. - Will schedule PET scan in three months to assess treatment response. - Will stop intravenous chemotherapy and continue with oral chemotherapy.  Right-sided lower extremity weakness Right-sided lower extremity weakness, possibly related to chemotherapy. No definitive cause identified. Physical therapy is planned to strengthen the leg and prevent falls. - Continue physical therapy to strengthen the leg and prevent falls. - Use a walker for support when out and about, and a cane or hold onto objects at home.  Nausea and vomiting Recent episode of nausea and vomiting, possibly related to contrast from MRI or prolonged fasting. Symptoms have resolved since the episode.  Leukopenia Slightly low white blood cell count, but not severe enough to contraindicate oral chemotherapy. - Continue to monitor white blood cell count regularly.  Plan -  Recent brain MRI images reviewed, no evidence  of stroke or metastatic disease. - Plan to change her treatment to third line [name], I called in prescription today, she will start tomorrow.  Plan to give on day 1-5 every 14 days. -f/u in 2 weeks    SUMMARY OF ONCOLOGIC HISTORY: Oncology History Overview Note   Cancer Staging  Gastric cancer [facility]) Staging form: Stomach, AJCC 8th Edition - Clinical stage from 04/19/2022: Stage IVB (cT2, cN0, pM1) - Signed by [name], MD on 05/08/2022 Total positive nodes: 0     Gastric cancer (HCC)  03/30/2022 Procedure   EGD:  Impression:  - Normal esophagus. - A few gastric polyps. Biopsied. - Gastritis. Biopsied. - Non-bleeding gastric ulcer with no stigmata of bleeding. Biopsied. - Normal examined duodenum. Biopsied.  Findings: Diffuse moderate inflammation characterized by congestion (edema), friability and granularity was found in the cardia, in the gastric fundus and in the gastric body. There were associated erosions in multiple places. Biopsies were taken from the antrum, body, and fundus with a cold forceps for histology. Estimated blood loss was minimal.  One non-bleeding cratered gastric ulcer with no stigmata of bleeding was found on the greater curvature of the stomach. The lesion was 6 mm in largest dimension. The mucosa around the ulcer was heaped and led to some deformity in the antrum. Biopsies were taken with a cold forceps for histology. Estimated blood loss was minimal.    03/30/2022 Pathology Results   Patient: Guzy, Shaundra P  Accession: [id number]  Diagnosis 1. Surgical [P], duodenal - BENIGN SMALL BOWEL MUCOSA WITH NO SIGNIFICANT PATHOLOGIC CHANGES 2. Surgical [P], gastric antrum - GASTRIC ANTRAL MUCOSA WITH FEATURES OF REACTIVE GASTROPATHY - NEGATIVE FOR H. PYLORI ON H&E STAIN - NEGATIVE FOR INTESTINAL METAPLASIA OR MALIGNANCY 3. Surgical [P], gastric body - GASTRIC OXYNTIC MUCOSA WITH REACTIVE/REPARATIVE CHANGES - NEGATIVE FOR H. PYLORI ON H&E STAIN -  NEGATIVE FOR INTESTINAL METAPLASIA, DYSPLASIA OR MALIGNANCY 4. Surgical [P], greater curve ulceration - ADENOCARCINOMA WITH SIGNET RING CELL FEATURES (SEE NOTE) 5. Surgical [P], gastric polyps - ADENOCARCINOMA WITH SIGNET RING CELL FEATURES (SEE NOTE) 6. Surgical [P], fundus (gastric) - ADENOCARCINOMA WITH SIGNET RING CELL FEATURES (SEE NOTE) 7. Surgical [P], colon, ascending, polyp (1) - TUBULAR ADENOMA. - NO HIGH GRADE DYSPLASIA OR MALIGNANCY. 8. Surgical [P], colon, transverse, polyp (1) - TUBULAR ADENOMA. - NO HIGH GRADE DYSPLASIA OR MALIGNANCY.    04/13/2022 Initial Diagnosis   Gastric cancer (HCC)   04/19/2022 Cancer Staging   Staging form: Stomach, AJCC 8th Edition - Clinical stage from 04/19/2022: Stage IVB (cT2, cN0, pM1) - Signed by [name], MD on 05/08/2022 Total positive nodes: 0   05/05/2022 Genetic Testing   Negative genetic testing on the Multi-cancer gene panel + RNA.  FH c.259C>T VUS identified.  The report date is May 05, 2022.  The Multi-Cancer + RNA Panel offered by [facility] includes sequencing and/or deletion/duplication analysis of the following 70 genes:  AIP*, ALK, APC*, ATM*, AXIN2*, BAP1*, BARD1*, BLM*, BMPR1A*, BRCA1*, BRCA2*, BRIP1*, CDC73*, CDH1*, CDK4, CDKN1B*, CDKN2A, CHEK2*, CTNNA1*, DICER1*, EPCAM (del/dup only), EGFR, FH*, FLCN*, GREM1 (promoter dup only), HOXB13, KIT, LZTR1, MAX*, MBD4, MEN1*, MET, MITF, MLH1*, MSH2*, MSH3*, MSH6*, MUTYH*, NF1*, NF2*, NTHL1*, PALB2*, PDGFRA, PMS2*, POLD1*, POLE*, POT1*, PRKAR1A*, PTCH1*, PTEN*, RAD51C*, RAD51D*, RB1*, RET, SDHA* (sequencing only), SDHAF2*, SDHB*, SDHC*, SDHD*, SMAD4*, SMARCA4*, SMARCB1*, SMARCE1*, STK11*, SUFU*, TMEM127*, TP53*, TSC1*, TSC2*, VHL*. RNA analysis is performed for * genes.    05/09/2022 - 06/07/2022 Chemotherapy  Patient is on Treatment Plan : GASTROESOPHAGEAL FLOT q14d X 4 cycles      Miscellaneous   Foundation One  Biomarker Findings Microsatellite status- Cannot be determined Tumor  Mutational Burden- Cannot be determined  Genomic Findings  FGFR2 amplification,FGFR2-TACC2 fusion,  Rearrangement intron 17 ARAF amplification CCND3 amplification TP53 V210fs*74     05/30/2022 Imaging    IMPRESSION: 1. Mild hypermetabolism corresponding to a dominant left upper quadrant mass and smaller perigastric nodules or nodes. 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 Recommend attention to the dominant left upper quadrant soft tissue mass on follow-up exams to exclude unlikely recurrent lymphoma. 2. No gastric hypermetabolism and no typical findings of metastatic disease.   06/20/2022 - 11/16/2022 Chemotherapy   Patient is on Treatment Plan : GASTRIC FOLFOX q14d x 12 cycles     08/27/2022 Imaging    IMPRESSION: No focal gastric mass on CT.   No findings suspicious for recurrent or metastatic disease.   Stable left upper abdominal soft tissue lesion and small lymph nodes, chronic, favoring treated lymphoma.   11/27/2022 Imaging    IMPRESSION: 1. Questionable thickening of the distal esophagus/GE junction and gastric antrum, consider further evaluation with endoscopy. 2. Chronically stable left upper quadrant nodularity and prominent lymph nodes again favored treated lymphoma. Continued attention on follow-up imaging suggested. 3. No convincing evidence of metastatic disease in the chest, abdomen or pelvis. 4. Questionable asymmetric wall thickening of the rectum, consider further evaluation with colonoscopy. 5. Mild wall thickening of a nondistended urinary bladder, correlate with urinalysis to exclude cystitis. 6. Hepatic steatosis.   07/10/2023 - 11/27/2023 Chemotherapy   Patient is on Treatment Plan : GASTROESOPHAGEAL Zolbetuximab (800/400) + FOLFOX D1,15,29 q42d x 4 cycles / Zolbetuximab (400) + 5FU + Leucovorin  D1,15,29 q42d     12/17/2023 -  Chemotherapy   Patient is on Treatment Plan : GASTRIC/GE JUNCTION PACLitaxel   D1,8,15 + Ramucirumab  D1,15 q28d        Discussed the use of AI scribe software for clinical note transcription with the patient, who gave verbal consent to proceed.  History of Present Illness Sheryl [name] is a 58 year old female with metastatic gastric cancer who presents for follow-up.  She had one episode of vomiting early Saturday morning that woke her from sleep, with poor oral intake for the rest of that day. By Sunday and today she is eating better and feels improved.  She notes a 2 pound weight loss, with current weight 149 pounds.  She has right-sided weakness that started after chemotherapy and sometimes positional hip pain, and she is unsure if they are related.  She uses a walker outside and prefers to hold onto furniture or use a cane inside because the walker feels too bulky indoors.  She takes potassium three times a day and has not yet started her new chemotherapy regimen.  She sometimes has positional hip pain.     All other systems were reviewed with the patient and are negative.  MEDICAL HISTORY:  Past Medical History:  Diagnosis Date   Allergy    Blood transfusion without reported diagnosis    had transfusion with hysterectomy   Cataract    Colon polyps 2012   Diabetes (HCC) 03/13/2021   Diabetes (HCC) 05/21/2019   Family history of breast cancer    Family history of pancreatic cancer    Family history of stomach cancer    Fibroid    gastric ca 03/2022   GERD (  gastroesophageal reflux disease)    H/O blood clots    History of hysterectomy    fibroids and heavy cycles   Hypertension    Stomach cancer (HCC) 04/05/22   Biopsy results from Dr Wilene    SURGICAL HISTORY: Past Surgical History:  Procedure Laterality Date   ABDOMINAL HYSTERECTOMY     BIOPSY  04/19/2022   Procedure: BIOPSY;  Surgeon: Wilhelmenia Aloha Raddle., MD;  Location: [facility];  Service: Gastroenterology;;   BIOPSY  03/21/2023   Procedure: BIOPSY;  Surgeon: Wilhelmenia Aloha Raddle., MD;  Location: [facility];  Service: Gastroenterology;;   COLONOSCOPY     ESOPHAGOGASTRODUODENOSCOPY (EGD) WITH PROPOFOL  N/A 04/19/2022   Procedure: ESOPHAGOGASTRODUODENOSCOPY (EGD) WITH PROPOFOL ;  Surgeon: Wilhelmenia Aloha Raddle., MD;  Location: [facility];  Service: Gastroenterology;  Laterality: N/A;   ESOPHAGOGASTRODUODENOSCOPY (EGD) WITH PROPOFOL  N/A 03/21/2023   Procedure: ESOPHAGOGASTRODUODENOSCOPY (EGD) WITH PROPOFOL ;  Surgeon: Wilhelmenia Aloha Raddle., MD;  Location: [facility];  Service: Gastroenterology;  Laterality: N/A;   EUS N/A 04/19/2022   Procedure: UPPER ENDOSCOPIC ULTRASOUND (EUS) RADIAL;  Surgeon: Wilhelmenia Aloha Raddle., MD;  Location: [facility];  Service: Gastroenterology;  Laterality: N/A;   EXCISION OF SKIN TAG  05/03/2022   Procedure: EXCISION OF CHEST WALL SKIN LESION;  Surgeon: Dasie Leonor CROME, MD;  Location: [facility];  Service: General;;   EYE SURGERY  1997   Removed cataracts   LAPAROSCOPY N/A 05/03/2022   Procedure: LAPAROSCOPY DIAGNOSTIC WITH PERITONEAL WASHINGS;  Surgeon: Dasie Leonor CROME, MD;  Location: [facility] OR;  Service: General;  Laterality: N/A;   POLYPECTOMY  04/19/2022   Procedure: POLYPECTOMY;  Surgeon: Wilhelmenia Aloha Raddle., MD;  Location: [facility];  Service: Gastroenterology;;   PORTACATH PLACEMENT N/A 05/03/2022   Procedure: INSERTION PORT-A-CATH WITH ULTRASOUND GUIDANCE;  Surgeon: Dasie Leonor CROME, MD;  Location: [facility] OR;  Service: General;  Laterality: N/A;   UPPER GASTROINTESTINAL ENDOSCOPY      I have reviewed the social history and family history with the patient and they are unchanged from previous note.  ALLERGIES:  is allergic to aspirin, cyclobenzaprine, naproxen sodium, zithromax [azithromycin dihydrate], oxaliplatin , and dilaudid [hydromorphone].  MEDICATIONS:  Current Outpatient Medications  Medication Sig Dispense Refill   acetaminophen  (TYLENOL ) 500 MG tablet Take 2 tablets (1,000 mg total) by mouth every 8 (eight)  hours as needed (pain). 30 tablet 1   amLODipine  (NORVASC ) 10 MG tablet Take 1 tablet (10 mg total) by mouth daily. 90 tablet 1   b complex vitamins capsule Take 1 capsule by mouth daily.     calcium -vitamin D (OSCAL WITH D) 500-5 MG-MCG tablet Take 2 tablets by mouth 2 (two) times daily.     famotidine  (PEPCID ) 20 MG tablet Take 1 tablet (20 mg total) by mouth 2 (two) times daily. 60 tablet 2   HYDROcodone -acetaminophen  (NORCO/VICODIN) 5-325 MG tablet Take 1 tablet by mouth every 12 (twelve) hours as needed for moderate pain (pain score 4-6). 60 tablet 0   lidocaine -prilocaine  (EMLA ) cream Apply 1 Application topically as needed. 30 g 1   metoCLOPramide  (REGLAN ) 10 MG tablet Take 1 tablet (10 mg total) by mouth every 8 (eight) hours as needed for nausea. 60 tablet 1   pantoprazole  (PROTONIX ) 40 MG tablet Take 1 tablet (40 mg total) by mouth daily. 30 tablet 2   potassium chloride  (KLOR-CON  M) 10 MEQ tablet Take 1 tablet (10 mEq total) by mouth 3 (three) times daily. 90 tablet 1   prochlorperazine  (COMPAZINE ) 10 MG tablet Take 1 tablet (10 mg  total) by mouth every 6 (six) hours as needed for nausea or vomiting. 30 tablet 2   promethazine  (PHENERGAN ) 25 MG tablet Take 2 tablets (50 mg total) by mouth 2 (two) times daily as needed for nausea or vomiting. 90 tablet 1   scopolamine  (TRANSDERM-SCOP) 1 MG/3DAYS Place 1 patch (1.5 mg total) onto the skin every 3 (three) days. 10 patch 1   sucralfate  (CARAFATE ) 1 g tablet Take 1 tablet (1 g total) by mouth 4 (four) times daily. 120 tablet 1   trifluridine-tipiracil ([name]) 20-8.19 MG tablet Take 3 tablets (60 mg of trifluridine total) by mouth 2 (two) times daily after a meal. Take within 1 hr after AM & PM meals on days 1-5 every 14 days. 60 tablet 1   zinc gluconate 50 MG tablet Take 50 mg by mouth daily.     No current facility-administered medications for this visit.    PHYSICAL EXAMINATION: ECOG PERFORMANCE STATUS: 2 - Symptomatic, <50% confined  to bed  Vitals:   03/30/24 1310  BP: 118/80  Pulse: 98  Resp: 16  Temp: 97.7 F (36.5 C)  SpO2: 99%   Wt Readings from Last 3 Encounters:  03/30/24 149 lb 12.8 oz (67.9 kg)  03/17/24 151 lb (68.5 kg)  03/11/24 151 lb 8 oz (68.7 kg)     GENERAL:alert, no distress and comfortable SKIN: skin color, texture, turgor are normal, no rashes or significant lesions EYES: normal, Conjunctiva are pink and non-injected, sclera clear LE: No edema   Physical Exam   LABORATORY DATA:  I have reviewed the data as listed    Latest Ref Rng & Units 03/30/2024   12:38 PM 03/11/2024    9:27 AM 02/26/2024    8:46 AM  CBC  WBC 4.0 - 10.5 K/uL 2.3  3.0  2.5   Hemoglobin 12.0 - 15.0 g/dL 87.9  87.5  88.2   Hematocrit 36.0 - 46.0 % 35.6  36.4  33.6   Platelets 150 - 400 K/uL 335  408  360         Latest Ref Rng & Units 03/30/2024   12:38 PM 03/11/2024    9:27 AM 02/26/2024    8:46 AM  CMP  Glucose 70 - 99 mg/dL 93  886  886   BUN 6 - 20 mg/dL 9  8  8    Creatinine 0.44 - 1.00 mg/dL 9.50  9.52  9.51   Sodium 135 - 145 mmol/L 141  139  136   Potassium 3.5 - 5.1 mmol/L 3.5  3.1  3.2   Chloride 98 - 111 mmol/L 106  105  104   CO2 22 - 32 mmol/L 26  28  26    Calcium  8.9 - 10.3 mg/dL 9.2  9.2  9.1   Total Protein 6.5 - 8.1 g/dL 6.3  6.5  6.4   Total Bilirubin 0.0 - 1.2 mg/dL 0.3  0.4  0.5   Alkaline Phos 38 - 126 U/L 71  68  57   AST 15 - 41 U/L 24  17  17    ALT 0 - 44 U/L 9  7  9        RADIOGRAPHIC STUDIES: I have personally reviewed the radiological images as listed and agreed with the findings in the report. No results found.    Orders Placed This Encounter  Procedures   Genetic Screening Order   All questions were answered. The patient knows to call the clinic with any problems, questions or concerns. No barriers  to learning was detected. The total time spent in the appointment was 30 minutes, including review of chart and various tests results, discussions about plan of care  and coordination of care plan     Onita Mattock, MD 03/30/2024

## 2024-03-30 NOTE — Progress Notes (Signed)
 Specialty Pharmacy Initial Fill Coordination Note  Sheryl [name] is a 57 y.o. female contacted today regarding refills of specialty medication(s) Trifluridine-Tipiracil ([name]) .  Patient requested [name] at [facility] at [place]  on 03/31/24   Medication will be filled on 03/30/2024.   Patient is aware of $4.00 copayment.  Patient would like to pick up at [facility]  03/31/2024 in the morning, Thanks

## 2024-03-30 NOTE — Progress Notes (Signed)
 Oral Chemotherapy Pharmacist Encounter  Patient was counseled under telephone encounter from 03/30/24.  Sheryl [name], PharmD, BCPS, BCOP Hematology/Oncology Clinical Pharmacist [phone] 03/30/2024 2:50 PM

## 2024-03-30 NOTE — Telephone Encounter (Signed)
 Oral Chemotherapy Pharmacist Encounter    I met with patient and patient's family member for overview of: [name] (trifluridine/tipiracil) for the treatment of metastatic gastric cancer, planned duration until disease progression or unacceptable toxicity.   Treatment goal: Palliative   Counseled patient on administration, dosing, side effects, monitoring, drug-food interactions, safe handling, storage, and disposal.  CBC w/ Diff and CMP from 03/30/24 assessed, noted patient WBC 2.3 K/uL and ANC 1200. No baseline dose adjustments required at this time. Prescription dose and frequency assessed for appropriateness. Patient is dosing [name] every other week per MD.   Patient will take [name] 20mg  (trifluridine component) tablets, 3 tablets (60mg  trifluridine) by mouth twice daily, within 1 hour of finishing AM & PM meals, on days 1-5 and days 15-19, every 28 days.  [name] start date: 03/31/24   Adverse effects include but are not limited to: fatigue, nausea, vomiting, diarrhea, and decreased blood counts.   Nausea/Vomiting PPX: Patient will take promethazine  at least 30 minutes prior to AM and PM doses of [name] to decrease nausea risk.  Diarrhea: Patient will obtain anti diarrheal and alert the office of 4 or more loose stools above baseline.  Reviewed with patient importance of keeping a medication schedule and plan for any missed doses. No barriers to medication adherence identified.   Medication reconciliation performed and medication/allergy list updated. Current medication list in Epic reviewed, no relevant/significant DDIs with [name] identified.  Distress thermometer flowsheet: Distress thermometer not completed during telephone call as patient has been on previous lines of therapy.   Communication and Learning Assessment Primary learner: Patient Barriers to learning: No barriers Preferred language: English Learning preferences: Listening Reading  All questions  answered.  Patient agreement for treatment documented in MD note on 03/30/24.  Ms. Bobst voiced understanding and appreciation.    Medication education handout placed in mail for patient. Patient knows to call the office with questions or concerns. Oral Chemotherapy Clinic phone number provided to patient.    Asberry Macintosh, PharmD, BCPS, BCOP Hematology/Oncology Clinical Pharmacist [phone] 03/30/2024 2:10 PM

## 2024-03-31 ENCOUNTER — Other Ambulatory Visit: Payer: Self-pay

## 2024-03-31 ENCOUNTER — Other Ambulatory Visit (HOSPITAL_COMMUNITY): Payer: Self-pay

## 2024-03-31 ENCOUNTER — Ambulatory Visit: Attending: Hematology | Admitting: Physical Therapy

## 2024-03-31 DIAGNOSIS — C162 Malignant neoplasm of body of stomach: Secondary | ICD-10-CM | POA: Diagnosis present

## 2024-03-31 DIAGNOSIS — M6281 Muscle weakness (generalized): Secondary | ICD-10-CM | POA: Diagnosis present

## 2024-03-31 DIAGNOSIS — R2689 Other abnormalities of gait and mobility: Secondary | ICD-10-CM | POA: Diagnosis present

## 2024-03-31 DIAGNOSIS — R208 Other disturbances of skin sensation: Secondary | ICD-10-CM | POA: Insufficient documentation

## 2024-03-31 NOTE — Progress Notes (Signed)
 Clinical Intervention Note  Clinical Intervention Notes: Patient asked about the appropriate timing of [name] in relation to meals. I reviewed the dosing schedule and advised her to take each dose within 30-60 minutes after eating a meal or at least a protein shake. She verbalized understanding.   Clinical Intervention Outcomes: Improved therapy effectiveness   Silvano LOISE Dolly Specialty Pharmacist

## 2024-03-31 NOTE — Therapy (Signed)
 OUTPATIENT PHYSICAL THERAPY  LOWER EXTREMITY ONCOLOGY TREATMENT  Patient Name: Sheryl [name] MRN: [id number] DOB:[birth year], 58 y.o., female Today's Date: 03/31/2024  END OF SESSION:  PT End of Session - 03/31/24 1149     Visit Number 2    Number of Visits 9    Date for Recertification  04/09/24    PT Start Time 1103    PT Stop Time 1149    PT Time Calculation (min) 46 min    Activity Tolerance Patient tolerated treatment well    Behavior During Therapy [facility] for tasks assessed/performed           Past Medical History:  Diagnosis Date   Allergy    Blood transfusion without reported diagnosis    had transfusion with hysterectomy   Cataract    Colon polyps 2012   Diabetes (HCC) 03/13/2021   Diabetes (HCC) 05/21/2019   Family history of breast cancer    Family history of pancreatic cancer    Family history of stomach cancer    Fibroid    gastric ca 03/2022   GERD (gastroesophageal reflux disease)    H/O blood clots    History of hysterectomy    fibroids and heavy cycles   Hypertension    Stomach cancer (HCC) 04/05/22   Biopsy results from Dr Wilene   Past Surgical History:  Procedure Laterality Date   ABDOMINAL HYSTERECTOMY     BIOPSY  04/19/2022   Procedure: BIOPSY;  Surgeon: Wilhelmenia Aloha Raddle., MD;  Location: [facility];  Service: Gastroenterology;;   BIOPSY  03/21/2023   Procedure: BIOPSY;  Surgeon: Wilhelmenia Aloha Raddle., MD;  Location: [facility];  Service: Gastroenterology;;   COLONOSCOPY     ESOPHAGOGASTRODUODENOSCOPY (EGD) WITH PROPOFOL  N/A 04/19/2022   Procedure: ESOPHAGOGASTRODUODENOSCOPY (EGD) WITH PROPOFOL ;  Surgeon: Wilhelmenia Aloha Raddle., MD;  Location: [facility];  Service: Gastroenterology;  Laterality: N/A;   ESOPHAGOGASTRODUODENOSCOPY (EGD) WITH PROPOFOL  N/A 03/21/2023   Procedure: ESOPHAGOGASTRODUODENOSCOPY (EGD) WITH PROPOFOL ;  Surgeon: Wilhelmenia Aloha Raddle., MD;  Location: [facility];  Service: Gastroenterology;  Laterality:  N/A;   EUS N/A 04/19/2022   Procedure: UPPER ENDOSCOPIC ULTRASOUND (EUS) RADIAL;  Surgeon: Wilhelmenia Aloha Raddle., MD;  Location: [facility];  Service: Gastroenterology;  Laterality: N/A;   EXCISION OF SKIN TAG  05/03/2022   Procedure: EXCISION OF CHEST WALL SKIN LESION;  Surgeon: Dasie Leonor CROME, MD;  Location: [facility];  Service: General;;   EYE SURGERY  1997   Removed cataracts   LAPAROSCOPY N/A 05/03/2022   Procedure: LAPAROSCOPY DIAGNOSTIC WITH PERITONEAL WASHINGS;  Surgeon: Dasie Leonor CROME, MD;  Location: [facility];  Service: General;  Laterality: N/A;   POLYPECTOMY  04/19/2022   Procedure: POLYPECTOMY;  Surgeon: Wilhelmenia Aloha Raddle., MD;  Location: [facility];  Service: Gastroenterology;;   PORTACATH PLACEMENT N/A 05/03/2022   Procedure: INSERTION PORT-A-CATH WITH ULTRASOUND GUIDANCE;  Surgeon: Dasie Leonor CROME, MD;  Location: [facility];  Service: General;  Laterality: N/A;   UPPER GASTROINTESTINAL ENDOSCOPY     Patient Active Problem List   Diagnosis Date Noted   Type 2 diabetes mellitus with diabetic neuropathy, without long-term current use of insulin  (HCC) 03/17/2024   Encounter for general adult medical examination with abnormal findings 03/17/2024   Immunization due 03/17/2024   Need for immunization against influenza 03/13/2023   Gastroesophageal reflux disease with esophagitis without hemorrhage 03/13/2023   Port-A-Cath in place 05/23/2022   Genetic testing 05/21/2022   Gastric cancer (HCC) 04/13/2022   Hypertension 01/24/2022   Type II diabetes  mellitus with manifestations (HCC) 01/24/2022   Need for vaccination 01/24/2022   Gastroesophageal reflux disease without esophagitis 01/23/2022   Hyperlipidemia LDL goal <100 01/23/2022   Diuretic-induced hypokalemia 01/23/2022    PCP: Debby Molt, MD  REFERRING PROVIDER: [name], MD  REFERRING DIAG: C16.2 (ICD-10-CM) - Malignant neoplasm of body of stomach (HCC)  THERAPY DIAG:  Other disturbances of skin  sensation  Muscle weakness (generalized)  Other abnormalities of gait and mobility  Malignant neoplasm of body of stomach (HCC)  ONSET DATE: 05/09/2022  Rationale for Evaluation and Treatment: Rehabilitation  SUBJECTIVE:                                                                                                                                                                                           SUBJECTIVE STATEMENT: I had the scan and it did not show any signs of a stroke. I ate a little for Thanksgiving. I brought my walker but it needs to be adjusted.   PERTINENT HISTORY: Metastatic gastric cancer with peritoneal metastasis Disease progression on recent PET scan with increased activity in the stomach and peritoneal nodule. Previous treatments included chemotherapy and antibody therapy with limited response. Current treatment with paclitaxel  causing significant neuropathy.Will be switching to oral chemo on Dec 1 in hopes of decreasing neuropathy  PAIN:  Are you having pain? Yes NPRS scale: 9/10 Pain location: bilateral feet Pain orientation: Bilateral  PAIN TYPE: tingling and numbness Pain description: constant  Aggravating factors: touching the foot (pain increases to 20), walking  Relieving factors: nothing  PRECAUTIONS: Pt reports when she stands for a long time she gets light headed 5-10 min, light headed with bending forward  RED FLAGS: None   WEIGHT BEARING RESTRICTIONS: No  FALLS:  Has patient fallen in last 6 months? Yes. Number of falls 1 (end of Sept 2025) - reports foot turned sideway and she fell on the floor    LIVING ENVIRONMENT: Lives with: lives with their family Lives in: House/apartment Stairs: No;  Has following equipment at home: Single point cane and Environmental Consultant - 2 wheeled, unable to drive  OCCUPATION: not working/ disability  LEISURE: does not exercise  PRIOR LEVEL OF FUNCTION: Independent with household mobility with device and Independent  with community mobility with device uses wall for balance in the home, makes small meals but would be unable to carry groceries  PATIENT GOALS: strengthen the feet to be able to drive again   OBJECTIVE: Note: Objective measures were completed at Evaluation unless otherwise noted.  COGNITION: Overall cognitive status: Within functional limits for tasks assessed   SENSATION: Unable to formally assess due to  extreme sensitivity in bilateral feet  POSTURE: forward head, rounded shoulders  LOWER EXTREMITY STRENGTH:  MMT Right eval  Hip flexion 2-/5  Hip extension   Hip abduction 2-/5  Hip adduction   Hip internal rotation   Hip external rotation   Knee flexion 3+/5  Knee extension 3+/5  Ankle dorsiflexion 1/5  Ankle plantarflexion   Ankle inversion   Ankle eversion   Great toe extension    (Blank rows = not tested)  MMT LEFT eval  Hip flexion 3/5  Hip extension   Hip abduction 3+/5  Hip adduction   Hip internal rotation   Hip external rotation   Knee flexion 4/5  Knee extension 3+/5  Ankle dorsiflexion 2+/5  Ankle plantarflexion   Ankle inversion   Ankle eversion   Great toe extension     (Blank rows = not tested)  FUNCTIONAL TESTS:  Lower Extremity Functional Scale:  Lower Extremity Functional Score: 11 / 80 = 13.8 %  An LEFS score is interpreted on a scale of 0 to 80, where a higher score indicates better lower extremity function. Scores between 61 and 80 typically represent minimal to normal limitation, scores from 41 to 60 suggest mild to moderate limitation, and scores below 40 indicate moderate to severe limitation. A score of 0 means a person is unable to perform most activities, while a score of 80 means there is no difficulty.    30 sec sit to stand: Unable   GAIT: Distance walked: 10 ft Assistive device utilized: Single point cane Level of assistance: SBA Comments: slow gait speed, shortened step length, decreased foot clearance, decreased hip  flexion on R                                                                                                                            TREATMENT DATE:  03/31/24: Adjusted pt's walker to be appropriate for her height Seated in chair: 1 foot at a time on vibration plate to improve neuropathy set to relax, 30Hz , 60 sec on low. Pt reports increased pain in L big toe and complete numbness in R foot Seated edge of mat: alternating marches x 10 with therapist providing contact to RLE to help with beginning the movement - the last 2 reps pt was able to do without any contact, TKE with 3 sec holds x 10 each with therapist hand behind ankle with very minimal assist to get movement started, heel raises x 10 reps bilaterally, toe raises x 10 bilaterally with trace movement noted on R, hip abduction with yellow band x 10 reps, ball squeezes with 3 sec holds x 10 with increased weakness noted on R Standing in // bars: knee flexion x 10 reps on L and 3 reps on R with increased difficulty noted with this. Pt reported dizziness with standing and then required a seated recovery period  03/12/24- demonstrated seated exercises to pt for pt to begin doing at home - marching, knee  extension and ankle DF (for R do in side lying without gravity)   PATIENT EDUCATION:  Education details: seated exercises (hip flexion, knee extension, ankle DF), in side lying work on R ankle DF without gravity, use walker instead of cane to decrease fall risk Person educated: Patient Education method: Medical Illustrator Education comprehension: verbalized understanding  HOME EXERCISE PROGRAM: Seated hip flexion, knee extension, L ankle DF In side lying work on R ankle DF   ASSESSMENT:  CLINICAL IMPRESSION: Pt has tried some of the seated exercises since her last session. She has stopped the chemo that most likely caused her neuropathy but is starting a new one today. She reports she gets dizzy when standing for a long  period of time and reports blurry vision with this. Recent scan did not show any brains mets or signs of recent stroke. Pt had to have a seated recovery period after standing exercises today due to dizziness. She requires contact assist for TKE and marching but very little physical assist. Encouraged pt to continue to practice these exercises at home for muscle memory. Ended session after pt felt dizzy and educated her to jot down if she has soreness or increased fatigue after session today. She has difficulty staying hydrated so it is importance she pace herself.   OBJECTIVE IMPAIRMENTS: Abnormal gait, decreased activity tolerance, decreased balance, decreased coordination, decreased knowledge of condition, decreased knowledge of use of DME, decreased mobility, difficulty walking, decreased ROM, decreased strength, impaired sensation, postural dysfunction, and pain.   ACTIVITY LIMITATIONS: bending, standing, squatting, sleeping, stairs, transfers, bed mobility, bathing, dressing, and caring for others  PARTICIPATION LIMITATIONS: meal prep, cleaning, laundry, driving, shopping, and community activity  PERSONAL FACTORS: Fitness and Time since onset of injury/illness/exacerbation are also affecting patient's functional outcome.   REHAB POTENTIAL: Good  CLINICAL DECISION MAKING: Unstable/unpredictable  EVALUATION COMPLEXITY: High   GOALS: Goals reviewed with patient? Yes  SHORT TERM GOALS: Target date: 03/30/24  Pt will demonstrate 2+/5 R hip flexion strength to decrease fall risk. Baseline: Goal status: INITIAL  2.  Pt will obtain an AFO to help with foot drop to decrease fall risk. Baseline:  Goal status: INITIAL  3.  Pt will demonstrate 2-/5 R ankle DF strength to decrease fall risk.  Baseline:  Goal status: INITIAL   LONG TERM GOALS: Target date: 04/09/24  Pt will report the numbness and tingling in her feet have improved by at least 40% to allow improved function. Baseline:   Goal status: INITIAL  2.  Pt will demonstrate 3/5 R hip DF strength to decrease fall risk and increase independence with ambulation. Baseline:  Goal status: INITIAL  3.  Pt will demonstrate 3/5 R hip flexion strength to help decrease fall risk when ambulating. Baseline:  Goal status: INITIAL  4.  Pt will be independent in a home exercise program for continued stretching and strengthening.  Baseline:  Goal status: INITIAL   PLAN:  PT FREQUENCY: 2x/week  PT DURATION: 4 weeks  PLANNED INTERVENTIONS: [id number]- PT Re-evaluation, 97750- Physical Performance Testing, 97110-Therapeutic exercises, 97530- Therapeutic activity, 97112- Neuromuscular re-education, 97535- Self Care, [id number]- Manual therapy, [phone]- Gait training, [phone]- Orthotic Initial, [phone]- Orthotic/Prosthetic subsequent, Patient/Family education, Balance training, Joint mobilization, Therapeutic exercises, Therapeutic activity, Neuromuscular re-education, Gait training, and Self Care  PLAN FOR NEXT SESSION: begin gentle LE strengthening especially R ankle and R hip, message sent to Dr. Lanny regarding hip weakness, gait training, AFO?   [facility] [place], PT 03/31/2024, 11:58 AM

## 2024-04-01 ENCOUNTER — Other Ambulatory Visit (HOSPITAL_COMMUNITY): Payer: Self-pay

## 2024-04-02 ENCOUNTER — Ambulatory Visit: Admitting: Rehabilitation

## 2024-04-02 ENCOUNTER — Encounter: Payer: Self-pay | Admitting: Rehabilitation

## 2024-04-02 DIAGNOSIS — R208 Other disturbances of skin sensation: Secondary | ICD-10-CM | POA: Diagnosis not present

## 2024-04-02 DIAGNOSIS — C162 Malignant neoplasm of body of stomach: Secondary | ICD-10-CM

## 2024-04-02 DIAGNOSIS — M6281 Muscle weakness (generalized): Secondary | ICD-10-CM

## 2024-04-02 DIAGNOSIS — R2689 Other abnormalities of gait and mobility: Secondary | ICD-10-CM

## 2024-04-02 NOTE — Therapy (Signed)
 OUTPATIENT PHYSICAL THERAPY  LOWER EXTREMITY ONCOLOGY TREATMENT  Patient Name: Sheryl [name] MRN: [id number] DOB:April 14, 1966, 58 y.o., female Today's Date: 04/02/2024  END OF SESSION:  PT End of Session - 04/02/24 1106     Visit Number 3    Number of Visits 9    Date for Recertification  04/09/24    PT Start Time 1109    PT Stop Time 1155    PT Time Calculation (min) 46 min    Activity Tolerance Patient tolerated treatment well    Behavior During Therapy [facility] for tasks assessed/performed           Past Medical History:  Diagnosis Date   Allergy    Blood transfusion without reported diagnosis    had transfusion with hysterectomy   Cataract    Colon polyps 2012   Diabetes (HCC) 03/13/2021   Diabetes (HCC) 05/21/2019   Family history of breast cancer    Family history of pancreatic cancer    Family history of stomach cancer    Fibroid    gastric ca 03/2022   GERD (gastroesophageal reflux disease)    H/O blood clots    History of hysterectomy    fibroids and heavy cycles   Hypertension    Stomach cancer (HCC) 04/05/22   Biopsy results from Dr Wilene   Past Surgical History:  Procedure Laterality Date   ABDOMINAL HYSTERECTOMY     BIOPSY  04/19/2022   Procedure: BIOPSY;  Surgeon: Wilhelmenia Aloha Raddle., MD;  Location: [facility];  Service: Gastroenterology;;   BIOPSY  03/21/2023   Procedure: BIOPSY;  Surgeon: Wilhelmenia Aloha Raddle., MD;  Location: [facility];  Service: Gastroenterology;;   COLONOSCOPY     ESOPHAGOGASTRODUODENOSCOPY (EGD) WITH PROPOFOL  N/A 04/19/2022   Procedure: ESOPHAGOGASTRODUODENOSCOPY (EGD) WITH PROPOFOL ;  Surgeon: Wilhelmenia Aloha Raddle., MD;  Location: [facility];  Service: Gastroenterology;  Laterality: N/A;   ESOPHAGOGASTRODUODENOSCOPY (EGD) WITH PROPOFOL  N/A 03/21/2023   Procedure: ESOPHAGOGASTRODUODENOSCOPY (EGD) WITH PROPOFOL ;  Surgeon: Wilhelmenia Aloha Raddle., MD;  Location: [facility];  Service: Gastroenterology;  Laterality:  N/A;   EUS N/A 04/19/2022   Procedure: UPPER ENDOSCOPIC ULTRASOUND (EUS) RADIAL;  Surgeon: Wilhelmenia Aloha Raddle., MD;  Location: [facility];  Service: Gastroenterology;  Laterality: N/A;   EXCISION OF SKIN TAG  05/03/2022   Procedure: EXCISION OF CHEST WALL SKIN LESION;  Surgeon: Dasie Leonor CROME, MD;  Location: [facility];  Service: General;;   EYE SURGERY  1997   Removed cataracts   LAPAROSCOPY N/A 05/03/2022   Procedure: LAPAROSCOPY DIAGNOSTIC WITH PERITONEAL WASHINGS;  Surgeon: Dasie Leonor CROME, MD;  Location: [facility];  Service: General;  Laterality: N/A;   POLYPECTOMY  04/19/2022   Procedure: POLYPECTOMY;  Surgeon: Wilhelmenia Aloha Raddle., MD;  Location: [facility];  Service: Gastroenterology;;   PORTACATH PLACEMENT N/A 05/03/2022   Procedure: INSERTION PORT-A-CATH WITH ULTRASOUND GUIDANCE;  Surgeon: Dasie Leonor CROME, MD;  Location: [facility];  Service: General;  Laterality: N/A;   UPPER GASTROINTESTINAL ENDOSCOPY     Patient Active Problem List   Diagnosis Date Noted   Type 2 diabetes mellitus with diabetic neuropathy, without long-term current use of insulin  (HCC) 03/17/2024   Encounter for general adult medical examination with abnormal findings 03/17/2024   Immunization due 03/17/2024   Need for immunization against influenza 03/13/2023   Gastroesophageal reflux disease with esophagitis without hemorrhage 03/13/2023   Port-A-Cath in place 05/23/2022   Genetic testing 05/21/2022   Gastric cancer (HCC) 04/13/2022   Hypertension 01/24/2022   Type II diabetes  mellitus with manifestations (HCC) 01/24/2022   Need for vaccination 01/24/2022   Gastroesophageal reflux disease without esophagitis 01/23/2022   Hyperlipidemia LDL goal <100 01/23/2022   Diuretic-induced hypokalemia 01/23/2022    PCP: Debby Molt, MD  REFERRING PROVIDER: [name], MD  REFERRING DIAG: C16.2 (ICD-10-CM) - Malignant neoplasm of body of stomach (HCC)  THERAPY DIAG:  Other disturbances of skin  sensation  Muscle weakness (generalized)  Other abnormalities of gait and mobility  Malignant neoplasm of body of stomach (HCC)  ONSET DATE: 05/09/2022  Rationale for Evaluation and Treatment: Rehabilitation  SUBJECTIVE:                                                                                                                                                                                           SUBJECTIVE STATEMENT: I felt fine after last time.    PERTINENT HISTORY: Metastatic gastric cancer with peritoneal metastasis Disease progression on recent PET scan with increased activity in the stomach and peritoneal nodule. Previous treatments included chemotherapy and antibody therapy with limited response. Current treatment with paclitaxel  causing significant neuropathy.Will be switching to oral chemo on Dec 1 in hopes of decreasing neuropathy  PAIN:  Are you having pain? No NPRS scale: Just numbness 10/10 Pain location: bilateral feet Pain orientation: Bilateral  PAIN TYPE: tingling and numbness Pain description: constant  Aggravating factors: touching the foot (pain increases to 20), walking  Relieving factors: nothing  PRECAUTIONS: Pt reports when she stands for a long time she gets light headed 5-10 min, light headed with bending forward  RED FLAGS: None   WEIGHT BEARING RESTRICTIONS: No  FALLS:  Has patient fallen in last 6 months? Yes. Number of falls 1 (end of Sept 2025) - reports foot turned sideway and she fell on the floor    LIVING ENVIRONMENT: Lives with: lives with their family Lives in: House/apartment Stairs: No;  Has following equipment at home: Single point cane and Environmental Consultant - 2 wheeled, unable to drive  OCCUPATION: not working/ disability  LEISURE: does not exercise  PRIOR LEVEL OF FUNCTION: Independent with household mobility with device and Independent with community mobility with device uses wall for balance in the home, makes small meals but  would be unable to carry groceries  PATIENT GOALS: strengthen the feet to be able to drive again   OBJECTIVE: Note: Objective measures were completed at Evaluation unless otherwise noted.  COGNITION: Overall cognitive status: Within functional limits for tasks assessed   SENSATION: Unable to formally assess due to extreme sensitivity in bilateral feet  POSTURE: forward head, rounded shoulders  LOWER EXTREMITY STRENGTH:  MMT Right eval  Hip  flexion 2-/5  Hip extension   Hip abduction 2-/5  Hip adduction   Hip internal rotation   Hip external rotation   Knee flexion 3+/5  Knee extension 3+/5  Ankle dorsiflexion 1/5  Ankle plantarflexion   Ankle inversion   Ankle eversion   Great toe extension    (Blank rows = not tested)  MMT LEFT eval  Hip flexion 3/5  Hip extension   Hip abduction 3+/5  Hip adduction   Hip internal rotation   Hip external rotation   Knee flexion 4/5  Knee extension 3+/5  Ankle dorsiflexion 2+/5  Ankle plantarflexion   Ankle inversion   Ankle eversion   Great toe extension     (Blank rows = not tested)  FUNCTIONAL TESTS:  Lower Extremity Functional Scale:  Lower Extremity Functional Score: 11 / 80 = 13.8 %  An LEFS score is interpreted on a scale of 0 to 80, where a higher score indicates better lower extremity function. Scores between 61 and 80 typically represent minimal to normal limitation, scores from 41 to 60 suggest mild to moderate limitation, and scores below 40 indicate moderate to severe limitation. A score of 0 means a person is unable to perform most activities, while a score of 80 means there is no difficulty.    30 sec sit to stand: Unable   GAIT: Distance walked: 10 ft Assistive device utilized: Single point cane Level of assistance: SBA Comments: slow gait speed, shortened step length, decreased foot clearance, decreased hip flexion on R                                                                                                                             TREATMENT DATE:  04/02/24 Seated in chair: 1 foot at a time on vibration plate to improve neuropathy set to relax, 30Hz , 60 sec on low. Ankle DF/PF AROM with each position. 1 in a long sit hamstring stretch position and one in a foot flat position.  Pt reports increased pain in L big toe and complete numbness in R foot Seated rocker board up and down x 5 Foot slide with sock on into toe point x 5  Seated edge of mat: alternating marches x 10 , LAQ with 3 sec holds x 10 each, without shoe to decrease weight,  hip abduction with yellow band x 10 reps, ball squeezes with 3 sec holds x 10 Standing in // bars: calf stretch 2x20 Rt only. Bil hip abduction x 5 with pt able to do the Rt but slow and with effort.   Updated HEP and gave yellow band   03/31/24: Adjusted pt's walker to be appropriate for her height Seated in chair: 1 foot at a time on vibration plate to improve neuropathy set to relax, 30Hz , 60 sec on low. Pt reports increased pain in L big toe and complete numbness in R foot Seated edge of mat: alternating marches x 10 with therapist providing contact  to RLE to help with beginning the movement - the last 2 reps pt was able to do without any contact, TKE with 3 sec holds x 10 each with therapist hand behind ankle with very minimal assist to get movement started, heel raises x 10 reps bilaterally, toe raises x 10 bilaterally with trace movement noted on R, hip abduction with yellow band x 10 reps, ball squeezes with 3 sec holds x 10 with increased weakness noted on R Standing in // bars: knee flexion x 10 reps on L and 3 reps on R with increased difficulty noted with this. Pt reported dizziness with standing and then required a seated recovery period  03/12/24- demonstrated seated exercises to pt for pt to begin doing at home - marching, knee extension and ankle DF (for R do in side lying without gravity)   PATIENT EDUCATION:  Education details: seated  exercises (hip flexion, knee extension, ankle DF), in side lying work on R ankle DF without gravity, use walker instead of cane to decrease fall risk Person educated: Patient Education method: Medical Illustrator Education comprehension: verbalized understanding  HOME EXERCISE PROGRAM: Seated hip flexion, knee extension, L ankle DF In side lying work on R ankle DF  Access Code: [email or web address] URL: [email or web address] Date: 04/02/2024 Prepared by: Saddie Raw  Exercises - Seated Long Arc Quad  - 1 x daily - 7 x weekly - 1-3 sets - 10 reps - 3 sec hold - Seated March  - 1 x daily - 7 x weekly - 1-3 sets - 10 reps - no hold - Seated Hip Adduction Isometrics with Ball  - 1 x daily - 7 x weekly - 1-3 sets - 10 reps - no hold - Seated Hip Abduction with Resistance  - 1 x daily - 7 x weekly - 1-3 sets - 10 reps - 3 seconds hold - Seated Heel Toe Raises  - 1 x daily - 7 x weekly - 1-3 sets - 10 reps - no hold - Standing Gastroc Stretch at Counter  - 1 x daily - 7 x weekly - 1 sets - 3 reps - 20-30 seconds hold - Standing Hip Abduction with Counter Support  - 1 x daily - 7 x weekly - 1 sets - 5-10 reps - no hold  ASSESSMENT:  CLINICAL IMPRESSION: Pt discovered that she can do much better movements without her shoe on and was excited to show these off.  Continued with strengthening and was able to add some standing abduction and calf stretch.  Fatigue level of 6/10 after treatment.   OBJECTIVE IMPAIRMENTS: Abnormal gait, decreased activity tolerance, decreased balance, decreased coordination, decreased knowledge of condition, decreased knowledge of use of DME, decreased mobility, difficulty walking, decreased ROM, decreased strength, impaired sensation, postural dysfunction, and pain.   ACTIVITY LIMITATIONS: bending, standing, squatting, sleeping, stairs, transfers, bed mobility, bathing, dressing, and caring for others  PARTICIPATION LIMITATIONS: meal prep, cleaning, laundry,  driving, shopping, and community activity  PERSONAL FACTORS: Fitness and Time since onset of injury/illness/exacerbation are also affecting patient's functional outcome.   REHAB POTENTIAL: Good  CLINICAL DECISION MAKING: Unstable/unpredictable  EVALUATION COMPLEXITY: High   GOALS: Goals reviewed with patient? Yes  SHORT TERM GOALS: Target date: 03/30/24  Pt will demonstrate 2+/5 R hip flexion strength to decrease fall risk. Baseline: Goal status: INITIAL  2.  Pt will obtain an AFO to help with foot drop to decrease fall risk. Baseline:  Goal status: INITIAL  3.  Pt will demonstrate 2-/5 R  ankle DF strength to decrease fall risk.  Baseline:  Goal status: INITIAL   LONG TERM GOALS: Target date: 04/09/24  Pt will report the numbness and tingling in her feet have improved by at least 40% to allow improved function. Baseline:  Goal status: INITIAL  2.  Pt will demonstrate 3/5 R hip DF strength to decrease fall risk and increase independence with ambulation. Baseline:  Goal status: INITIAL  3.  Pt will demonstrate 3/5 R hip flexion strength to help decrease fall risk when ambulating. Baseline:  Goal status: INITIAL  4.  Pt will be independent in a home exercise program for continued stretching and strengthening.  Baseline:  Goal status: INITIAL   PLAN:  PT FREQUENCY: 2x/week  PT DURATION: 4 weeks  PLANNED INTERVENTIONS: [id number]- PT Re-evaluation, 97750- Physical Performance Testing, 97110-Therapeutic exercises, 97530- Therapeutic activity, 97112- Neuromuscular re-education, 97535- Self Care, [id number]- Manual therapy, [phone]- Gait training, [phone]- Orthotic Initial, [phone]- Orthotic/Prosthetic subsequent, Patient/Family education, Balance training, Joint mobilization, Therapeutic exercises, Therapeutic activity, Neuromuscular re-education, Gait training, and Self Care  PLAN FOR NEXT SESSION: begin gentle LE strengthening especially R ankle and R hip, message sent to Dr. Lanny  regarding hip weakness, gait training, AFO?   Larue Saddie SAUNDERS, PT 04/02/2024, 11:59 AM

## 2024-04-07 ENCOUNTER — Ambulatory Visit

## 2024-04-07 DIAGNOSIS — R208 Other disturbances of skin sensation: Secondary | ICD-10-CM | POA: Diagnosis not present

## 2024-04-07 DIAGNOSIS — R2689 Other abnormalities of gait and mobility: Secondary | ICD-10-CM

## 2024-04-07 DIAGNOSIS — C162 Malignant neoplasm of body of stomach: Secondary | ICD-10-CM

## 2024-04-07 DIAGNOSIS — M6281 Muscle weakness (generalized): Secondary | ICD-10-CM

## 2024-04-07 NOTE — Therapy (Signed)
 OUTPATIENT PHYSICAL THERAPY  LOWER EXTREMITY ONCOLOGY TREATMENT  Patient Name: Sheryl [name] MRN: [id number] DOB:07-29-1965, 58 y.o., female Today's Date: 04/07/2024  END OF SESSION:  PT End of Session - 04/07/24 0909     Visit Number 4    Number of Visits 9    Date for Recertification  04/09/24    PT Start Time 0903    PT Stop Time 1001    PT Time Calculation (min) 58 min    Activity Tolerance Patient tolerated treatment well;Patient limited by fatigue    Behavior During Therapy [facility] for tasks assessed/performed           Past Medical History:  Diagnosis Date   Allergy    Blood transfusion without reported diagnosis    had transfusion with hysterectomy   Cataract    Colon polyps 2012   Diabetes (HCC) 03/13/2021   Diabetes (HCC) 05/21/2019   Family history of breast cancer    Family history of pancreatic cancer    Family history of stomach cancer    Fibroid    gastric ca 03/2022   GERD (gastroesophageal reflux disease)    H/O blood clots    History of hysterectomy    fibroids and heavy cycles   Hypertension    Stomach cancer (HCC) 04/05/22   Biopsy results from Dr Wilene   Past Surgical History:  Procedure Laterality Date   ABDOMINAL HYSTERECTOMY     BIOPSY  04/19/2022   Procedure: BIOPSY;  Surgeon: Wilhelmenia Aloha Raddle., MD;  Location: [facility];  Service: Gastroenterology;;   BIOPSY  03/21/2023   Procedure: BIOPSY;  Surgeon: Wilhelmenia Aloha Raddle., MD;  Location: [facility];  Service: Gastroenterology;;   COLONOSCOPY     ESOPHAGOGASTRODUODENOSCOPY (EGD) WITH PROPOFOL  N/A 04/19/2022   Procedure: ESOPHAGOGASTRODUODENOSCOPY (EGD) WITH PROPOFOL ;  Surgeon: Wilhelmenia Aloha Raddle., MD;  Location: [facility];  Service: Gastroenterology;  Laterality: N/A;   ESOPHAGOGASTRODUODENOSCOPY (EGD) WITH PROPOFOL  N/A 03/21/2023   Procedure: ESOPHAGOGASTRODUODENOSCOPY (EGD) WITH PROPOFOL ;  Surgeon: Wilhelmenia Aloha Raddle., MD;  Location: [facility];  Service:  Gastroenterology;  Laterality: N/A;   EUS N/A 04/19/2022   Procedure: UPPER ENDOSCOPIC ULTRASOUND (EUS) RADIAL;  Surgeon: Wilhelmenia Aloha Raddle., MD;  Location: [facility];  Service: Gastroenterology;  Laterality: N/A;   EXCISION OF SKIN TAG  05/03/2022   Procedure: EXCISION OF CHEST WALL SKIN LESION;  Surgeon: Dasie Leonor CROME, MD;  Location: [facility];  Service: General;;   EYE SURGERY  1997   Removed cataracts   LAPAROSCOPY N/A 05/03/2022   Procedure: LAPAROSCOPY DIAGNOSTIC WITH PERITONEAL WASHINGS;  Surgeon: Dasie Leonor CROME, MD;  Location: [facility] OR;  Service: General;  Laterality: N/A;   POLYPECTOMY  04/19/2022   Procedure: POLYPECTOMY;  Surgeon: Wilhelmenia Aloha Raddle., MD;  Location: [facility];  Service: Gastroenterology;;   PORTACATH PLACEMENT N/A 05/03/2022   Procedure: INSERTION PORT-A-CATH WITH ULTRASOUND GUIDANCE;  Surgeon: Dasie Leonor CROME, MD;  Location: [facility];  Service: General;  Laterality: N/A;   UPPER GASTROINTESTINAL ENDOSCOPY     Patient Active Problem List   Diagnosis Date Noted   Type 2 diabetes mellitus with diabetic neuropathy, without long-term current use of insulin  (HCC) 03/17/2024   Encounter for general adult medical examination with abnormal findings 03/17/2024   Immunization due 03/17/2024   Need for immunization against influenza 03/13/2023   Gastroesophageal reflux disease with esophagitis without hemorrhage 03/13/2023   Port-A-Cath in place 05/23/2022   Genetic testing 05/21/2022   Gastric cancer (HCC) 04/13/2022   Hypertension 01/24/2022  Type II diabetes mellitus with manifestations (HCC) 01/24/2022   Need for vaccination 01/24/2022   Gastroesophageal reflux disease without esophagitis 01/23/2022   Hyperlipidemia LDL goal <100 01/23/2022   Diuretic-induced hypokalemia 01/23/2022    PCP: Debby Molt, MD  REFERRING PROVIDER: [name], MD  REFERRING DIAG: C16.2 (ICD-10-CM) - Malignant neoplasm of body of stomach (HCC)  THERAPY DIAG:  Other  disturbances of skin sensation  Muscle weakness (generalized)  Other abnormalities of gait and mobility  Malignant neoplasm of body of stomach (HCC)  ONSET DATE: 05/09/2022  Rationale for Evaluation and Treatment: Rehabilitation  SUBJECTIVE:                                                                                                                                                                                           SUBJECTIVE STATEMENT: I feel like I can do a bit more at home now. I find I need to keep my shoes on when I do it at home but I did better when I was here last time though without my Rt shoe on.  PERTINENT HISTORY: Metastatic gastric cancer with peritoneal metastasis Disease progression on recent PET scan with increased activity in the stomach and peritoneal nodule. Previous treatments included chemotherapy and antibody therapy with limited response. Current treatment with paclitaxel  causing significant neuropathy.Will be switching to oral chemo on Dec 1 in hopes of decreasing neuropathy  PAIN:  Are you having pain? No NPRS scale: Just numbness Rt 8-10/10, Lt 2/10 Pain location: bilateral feet Pain orientation: Bilateral  PAIN TYPE: tingling and numbness Pain description: constant  Aggravating factors: touching the foot (pain increases to 20), walking  Relieving factors: nothing  PRECAUTIONS: Pt reports when she stands for a long time she gets light headed 5-10 min, light headed with bending forward  RED FLAGS: None   WEIGHT BEARING RESTRICTIONS: No  FALLS:  Has patient fallen in last 6 months? Yes. Number of falls 1 (end of Sept 2025) - reports foot turned sideway and she fell on the floor    LIVING ENVIRONMENT: Lives with: lives with their family Lives in: House/apartment Stairs: No;  Has following equipment at home: Single point cane and Environmental Consultant - 2 wheeled, unable to drive  OCCUPATION: not working/ disability  LEISURE: does not exercise  PRIOR  LEVEL OF FUNCTION: Independent with household mobility with device and Independent with community mobility with device uses wall for balance in the home, makes small meals but would be unable to carry groceries  PATIENT GOALS: strengthen the feet to be able to drive again   OBJECTIVE: Note: Objective measures were completed at Evaluation unless otherwise noted.  COGNITION: Overall cognitive status: Within functional limits for tasks assessed   SENSATION: Unable to formally assess due to extreme sensitivity in bilateral feet  POSTURE: forward head, rounded shoulders  LOWER EXTREMITY STRENGTH:  MMT Right eval  Hip flexion 2-/5  Hip extension   Hip abduction 2-/5  Hip adduction   Hip internal rotation   Hip external rotation   Knee flexion 3+/5  Knee extension 3+/5  Ankle dorsiflexion 1/5  Ankle plantarflexion   Ankle inversion   Ankle eversion   Great toe extension    (Blank rows = not tested)  MMT LEFT eval  Hip flexion 3/5  Hip extension   Hip abduction 3+/5  Hip adduction   Hip internal rotation   Hip external rotation   Knee flexion 4/5  Knee extension 3+/5  Ankle dorsiflexion 2+/5  Ankle plantarflexion   Ankle inversion   Ankle eversion   Great toe extension     (Blank rows = not tested)  FUNCTIONAL TESTS:  Lower Extremity Functional Scale:  Lower Extremity Functional Score: 11 / 80 = 13.8 %  An LEFS score is interpreted on a scale of 0 to 80, where a higher score indicates better lower extremity function. Scores between 61 and 80 typically represent minimal to normal limitation, scores from 41 to 60 suggest mild to moderate limitation, and scores below 40 indicate moderate to severe limitation. A score of 0 means a person is unable to perform most activities, while a score of 80 means there is no difficulty.    30 sec sit to stand: Unable   GAIT: Distance walked: 10 ft Assistive device utilized: Single point cane Level of assistance: SBA Comments:  slow gait speed, shortened step length, decreased foot clearance, decreased hip flexion on R                                                                                                                            TREATMENT DATE:  04/07/24: Therapeutic Exercises and Acitivities Seated in chair: Started with both feet on plate 2 x 30 sec to improve neuropathy set to stretch, 30Hz , 60 sec on low, then removed Rt shoe for 1 foot at a time on vibration plate . Ankle DF/PF AROM with each position. 1 in a long sit hamstring stretch position and one in a foot flat position. Conts with reports of Lt great toe pain when vibration on Seated rocker board up and down x 15 Seated on pink disc for following:  Alt LAQ 2 x 5, 5 sec holds; alt march x 7 each (alternated LAQ with the marching); VC's for core engaged for stability throughout Hip abduction with yellow loop x 10 reps, ball squeezes with 3 sec holds x 15 Standing in // bars for following: Calf stretch standing on incline at end of bars 2 x 20 each leg Standing 3 way hip SLR into flex, abd and ext with VC's for correct technique; pt with slow, controlled motions,  pt reports RPE 4-5 after standing activities seated rest break after NuStep: Level 3, UE 9 /LE 10 x 6 min and 126 steps; first x 3 mins with arms/legs then last x 3 mins with UE's as pt reports she could tell her arms were working more than her legs, seated rest after Self Care During rest breaks spent time educating pt about importance of standing erect as much as able and not letting herself lean forward with her walker. Also answered her questions about how the exercises will help improve her balance and strength.   04/02/24 Seated in chair: 1 foot at a time on vibration plate to improve neuropathy set to relax, 30Hz , 60 sec on low. Ankle DF/PF AROM with each position. 1 in a long sit hamstring stretch position and one in a foot flat position.  Pt reports increased pain in L big toe and  complete numbness in R foot Seated rocker board up and down x 5 Foot slide with sock on into toe point x 5  Seated edge of mat: alternating marches x 10 , LAQ with 3 sec holds x 10 each, without shoe to decrease weight,  hip abduction with yellow band x 10 reps, ball squeezes with 3 sec holds x 10 Standing in // bars: calf stretch 2x20 Rt only. Bil hip abduction x 5 with pt able to do the Rt but slow and with effort.   Updated HEP and gave yellow band   03/31/24: Adjusted pt's walker to be appropriate for her height Seated in chair: 1 foot at a time on vibration plate to improve neuropathy set to relax, 30Hz , 60 sec on low. Pt reports increased pain in L big toe and complete numbness in R foot Seated edge of mat: alternating marches x 10 with therapist providing contact to RLE to help with beginning the movement - the last 2 reps pt was able to do without any contact, TKE with 3 sec holds x 10 each with therapist hand behind ankle with very minimal assist to get movement started, heel raises x 10 reps bilaterally, toe raises x 10 bilaterally with trace movement noted on R, hip abduction with yellow band x 10 reps, ball squeezes with 3 sec holds x 10 with increased weakness noted on R Standing in // bars: knee flexion x 10 reps on L and 3 reps on R with increased difficulty noted with this. Pt reported dizziness with standing and then required a seated recovery period  03/12/24- demonstrated seated exercises to pt for pt to begin doing at home - marching, knee extension and ankle DF (for R do in side lying without gravity)   PATIENT EDUCATION:  Education details: seated exercises (hip flexion, knee extension, ankle DF), in side lying work on R ankle DF without gravity, use walker instead of cane to decrease fall risk Person educated: Patient Education method: Medical Illustrator Education comprehension: verbalized understanding  HOME EXERCISE PROGRAM: Seated hip flexion, knee  extension, L ankle DF In side lying work on R ankle DF  Access Code: [email or web address] URL: [email or web address] Date: 04/02/2024 Prepared by: Saddie Raw  Exercises - Seated Long Arc Quad  - 1 x daily - 7 x weekly - 1-3 sets - 10 reps - 3 sec hold - Seated March  - 1 x daily - 7 x weekly - 1-3 sets - 10 reps - no hold - Seated Hip Adduction Isometrics with Ball  - 1 x daily - 7 x weekly - 1-3 sets -  10 reps - no hold - Seated Hip Abduction with Resistance  - 1 x daily - 7 x weekly - 1-3 sets - 10 reps - 3 seconds hold - Seated Heel Toe Raises  - 1 x daily - 7 x weekly - 1-3 sets - 10 reps - no hold - Standing Gastroc Stretch at Counter  - 1 x daily - 7 x weekly - 1 sets - 3 reps - 20-30 seconds hold - Standing Hip Abduction with Counter Support  - 1 x daily - 7 x weekly - 1 sets - 5-10 reps - no hold  ASSESSMENT:  CLINICAL IMPRESSION: Pt reports has been doing new HEP with good success at home. Today she was able to tolerate increased activity and being challenged. Some of seated LE exs were performed on balance disc for stability work. Then she was able to tolerate prolonged standing in // bars and she reports being pleased as this is the longest she has stood in awhile. Also added NuStep for strength and endurance. Encouragement throughout to work on steady pace instead of slow and choppy.   OBJECTIVE IMPAIRMENTS: Abnormal gait, decreased activity tolerance, decreased balance, decreased coordination, decreased knowledge of condition, decreased knowledge of use of DME, decreased mobility, difficulty walking, decreased ROM, decreased strength, impaired sensation, postural dysfunction, and pain.   ACTIVITY LIMITATIONS: bending, standing, squatting, sleeping, stairs, transfers, bed mobility, bathing, dressing, and caring for others  PARTICIPATION LIMITATIONS: meal prep, cleaning, laundry, driving, shopping, and community activity  PERSONAL FACTORS: Fitness and Time since onset of  injury/illness/exacerbation are also affecting patient's functional outcome.   REHAB POTENTIAL: Good  CLINICAL DECISION MAKING: Unstable/unpredictable  EVALUATION COMPLEXITY: High   GOALS: Goals reviewed with patient? Yes  SHORT TERM GOALS: Target date: 03/30/24  Pt will demonstrate 2+/5 R hip flexion strength to decrease fall risk. Baseline: Goal status: INITIAL  2.  Pt will obtain an AFO to help with foot drop to decrease fall risk. Baseline:  Goal status: INITIAL  3.  Pt will demonstrate 2-/5 R ankle DF strength to decrease fall risk.  Baseline:  Goal status: INITIAL   LONG TERM GOALS: Target date: 04/09/24  Pt will report the numbness and tingling in her feet have improved by at least 40% to allow improved function. Baseline:  Goal status: INITIAL  2.  Pt will demonstrate 3/5 R hip DF strength to decrease fall risk and increase independence with ambulation. Baseline:  Goal status: INITIAL  3.  Pt will demonstrate 3/5 R hip flexion strength to help decrease fall risk when ambulating. Baseline:  Goal status: INITIAL  4.  Pt will be independent in a home exercise program for continued stretching and strengthening.  Baseline:  Goal status: INITIAL   PLAN:  PT FREQUENCY: 2x/week  PT DURATION: 4 weeks  PLANNED INTERVENTIONS: 97164- PT Re-evaluation, 97750- Physical Performance Testing, 97110-Therapeutic exercises, 97530- Therapeutic activity, 97112- Neuromuscular re-education, 97535- Self Care, [id number]- Manual therapy, [phone]- Gait training, [phone]- Orthotic Initial, [phone]- Orthotic/Prosthetic subsequent, Patient/Family education, Balance training, Joint mobilization, Therapeutic exercises, Therapeutic activity, Neuromuscular re-education, Gait training, and Self Care  PLAN FOR NEXT SESSION: Cont gentle LE strengthening especially R ankle and R hip, standing activities and gait training as able, especially depending on when she had her last chemotherapy,  AFO?   Aden Berwyn Caldron, PTA 04/07/2024, 10:16 AM

## 2024-04-09 ENCOUNTER — Ambulatory Visit: Admitting: Physical Therapy

## 2024-04-09 ENCOUNTER — Encounter: Payer: Self-pay | Admitting: Physical Therapy

## 2024-04-09 DIAGNOSIS — R208 Other disturbances of skin sensation: Secondary | ICD-10-CM | POA: Diagnosis not present

## 2024-04-09 DIAGNOSIS — R2689 Other abnormalities of gait and mobility: Secondary | ICD-10-CM

## 2024-04-09 DIAGNOSIS — C162 Malignant neoplasm of body of stomach: Secondary | ICD-10-CM

## 2024-04-09 DIAGNOSIS — M6281 Muscle weakness (generalized): Secondary | ICD-10-CM

## 2024-04-09 NOTE — Therapy (Signed)
 OUTPATIENT PHYSICAL THERAPY  LOWER EXTREMITY ONCOLOGY TREATMENT  Patient Name: Sheryl [name] MRN: [id number] DOB:05/07/1965, 58 y.o., female Today's Date: 04/09/2024  END OF SESSION:  PT End of Session - 04/09/24 1050     Visit Number 5    Number of Visits 9    Date for Recertification  04/09/24    PT Start Time 1004    PT Stop Time 1056    PT Time Calculation (min) 52 min    Activity Tolerance Patient tolerated treatment well    Behavior During Therapy [facility] for tasks assessed/performed            Past Medical History:  Diagnosis Date   Allergy    Blood transfusion without reported diagnosis    had transfusion with hysterectomy   Cataract    Colon polyps 2012   Diabetes (HCC) 03/13/2021   Diabetes (HCC) 05/21/2019   Family history of breast cancer    Family history of pancreatic cancer    Family history of stomach cancer    Fibroid    gastric ca 03/2022   GERD (gastroesophageal reflux disease)    H/O blood clots    History of hysterectomy    fibroids and heavy cycles   Hypertension    Stomach cancer (HCC) 04/05/22   Biopsy results from Dr Wilene   Past Surgical History:  Procedure Laterality Date   ABDOMINAL HYSTERECTOMY     BIOPSY  04/19/2022   Procedure: BIOPSY;  Surgeon: Wilhelmenia Aloha Raddle., MD;  Location: [facility];  Service: Gastroenterology;;   BIOPSY  03/21/2023   Procedure: BIOPSY;  Surgeon: Wilhelmenia Aloha Raddle., MD;  Location: [facility];  Service: Gastroenterology;;   COLONOSCOPY     ESOPHAGOGASTRODUODENOSCOPY (EGD) WITH PROPOFOL  N/A 04/19/2022   Procedure: ESOPHAGOGASTRODUODENOSCOPY (EGD) WITH PROPOFOL ;  Surgeon: Wilhelmenia Aloha Raddle., MD;  Location: [facility];  Service: Gastroenterology;  Laterality: N/A;   ESOPHAGOGASTRODUODENOSCOPY (EGD) WITH PROPOFOL  N/A 03/21/2023   Procedure: ESOPHAGOGASTRODUODENOSCOPY (EGD) WITH PROPOFOL ;  Surgeon: Wilhelmenia Aloha Raddle., MD;  Location: [facility];  Service: Gastroenterology;   Laterality: N/A;   EUS N/A 04/19/2022   Procedure: UPPER ENDOSCOPIC ULTRASOUND (EUS) RADIAL;  Surgeon: Wilhelmenia Aloha Raddle., MD;  Location: [facility];  Service: Gastroenterology;  Laterality: N/A;   EXCISION OF SKIN TAG  05/03/2022   Procedure: EXCISION OF CHEST WALL SKIN LESION;  Surgeon: Dasie Leonor CROME, MD;  Location: [facility];  Service: General;;   EYE SURGERY  1997   Removed cataracts   LAPAROSCOPY N/A 05/03/2022   Procedure: LAPAROSCOPY DIAGNOSTIC WITH PERITONEAL WASHINGS;  Surgeon: Dasie Leonor CROME, MD;  Location: [facility];  Service: General;  Laterality: N/A;   POLYPECTOMY  04/19/2022   Procedure: POLYPECTOMY;  Surgeon: Wilhelmenia Aloha Raddle., MD;  Location: [facility];  Service: Gastroenterology;;   PORTACATH PLACEMENT N/A 05/03/2022   Procedure: INSERTION PORT-A-CATH WITH ULTRASOUND GUIDANCE;  Surgeon: Dasie Leonor CROME, MD;  Location: [facility];  Service: General;  Laterality: N/A;   UPPER GASTROINTESTINAL ENDOSCOPY     Patient Active Problem List   Diagnosis Date Noted   Type 2 diabetes mellitus with diabetic neuropathy, without long-term current use of insulin  (HCC) 03/17/2024   Encounter for general adult medical examination with abnormal findings 03/17/2024   Immunization due 03/17/2024   Need for immunization against influenza 03/13/2023   Gastroesophageal reflux disease with esophagitis without hemorrhage 03/13/2023   Port-A-Cath in place 05/23/2022   Genetic testing 05/21/2022   Gastric cancer (HCC) 04/13/2022   Hypertension 01/24/2022   Type II  diabetes mellitus with manifestations (HCC) 01/24/2022   Need for vaccination 01/24/2022   Gastroesophageal reflux disease without esophagitis 01/23/2022   Hyperlipidemia LDL goal <100 01/23/2022   Diuretic-induced hypokalemia 01/23/2022    PCP: Debby Molt, MD  REFERRING PROVIDER: [name], MD  REFERRING DIAG: C16.2 (ICD-10-CM) - Malignant neoplasm of body of stomach (HCC)  THERAPY DIAG:  Other disturbances of skin  sensation  Muscle weakness (generalized)  Other abnormalities of gait and mobility  Malignant neoplasm of body of stomach (HCC)  ONSET DATE: 05/09/2022  Rationale for Evaluation and Treatment: Rehabilitation  SUBJECTIVE:                                                                                                                                                                                           SUBJECTIVE STATEMENT: I had awful pain in my stomach after last session. I haven't had that before. I am not sure if it was the core exercises or the chemo. I had to take pain medication and then go to sleep and when I got up it was gone. It had started about 1.5 hours after therapy.   PERTINENT HISTORY: Metastatic gastric cancer with peritoneal metastasis Disease progression on recent PET scan with increased activity in the stomach and peritoneal nodule. Previous treatments included chemotherapy and antibody therapy with limited response. Current treatment with paclitaxel  causing significant neuropathy.Will be switching to oral chemo on Dec 1 in hopes of decreasing neuropathy  PAIN:  Are you having pain? No NPRS scale: Just numbness Rt 8-10/10, Lt 4/10 Pain location: bilateral feet Pain orientation: Bilateral  PAIN TYPE: tingling and numbness Pain description: constant  Aggravating factors: touching the foot (pain increases to 20), walking  Relieving factors: nothing  PRECAUTIONS: Pt reports when she stands for a long time she gets light headed 5-10 min, light headed with bending forward  RED FLAGS: None   WEIGHT BEARING RESTRICTIONS: No  FALLS:  Has patient fallen in last 6 months? Yes. Number of falls 1 (end of Sept 2025) - reports foot turned sideway and she fell on the floor    LIVING ENVIRONMENT: Lives with: lives with their family Lives in: House/apartment Stairs: No;  Has following equipment at home: Single point cane and Walker - 2 wheeled, unable to  drive  OCCUPATION: not working/ disability  LEISURE: does not exercise  PRIOR LEVEL OF FUNCTION: Independent with household mobility with device and Independent with community mobility with device uses wall for balance in the home, makes small meals but would be unable to carry groceries  PATIENT GOALS: strengthen the feet to be able to drive again  OBJECTIVE: Note: Objective measures were completed at Evaluation unless otherwise noted.  COGNITION: Overall cognitive status: Within functional limits for tasks assessed   SENSATION: Unable to formally assess due to extreme sensitivity in bilateral feet  POSTURE: forward head, rounded shoulders  LOWER EXTREMITY STRENGTH:  MMT Right eval  Hip flexion 2-/5  Hip extension   Hip abduction 2-/5  Hip adduction   Hip internal rotation   Hip external rotation   Knee flexion 3+/5  Knee extension 3+/5  Ankle dorsiflexion 1/5  Ankle plantarflexion   Ankle inversion   Ankle eversion   Great toe extension    (Blank rows = not tested)  MMT LEFT eval  Hip flexion 3/5  Hip extension   Hip abduction 3+/5  Hip adduction   Hip internal rotation   Hip external rotation   Knee flexion 4/5  Knee extension 3+/5  Ankle dorsiflexion 2+/5  Ankle plantarflexion   Ankle inversion   Ankle eversion   Great toe extension     (Blank rows = not tested)  FUNCTIONAL TESTS:  Lower Extremity Functional Scale:  Lower Extremity Functional Score: 11 / 80 = 13.8 %  An LEFS score is interpreted on a scale of 0 to 80, where a higher score indicates better lower extremity function. Scores between 61 and 80 typically represent minimal to normal limitation, scores from 41 to 60 suggest mild to moderate limitation, and scores below 40 indicate moderate to severe limitation. A score of 0 means a person is unable to perform most activities, while a score of 80 means there is no difficulty.    30 sec sit to stand: Unable   GAIT: Distance walked: 10  ft Assistive device utilized: Single point cane Level of assistance: SBA Comments: slow gait speed, shortened step length, decreased foot clearance, decreased hip flexion on R                                                                                                                            TREATMENT DATE:  04/09/24: Therapeutic Exercises and Acitivities Seated in chair: Started with both feet on plate 2 x 30 sec to improve neuropathy set to stretch with no shoe on R foot, 30Hz , then 1x 60 sec on low, with both feet on vibration plate while performing ankle DF/PF AROM with each position then 60 sec x in a long sit hamstring stretch position - 1 leg at a time. Conts with reports of Lt great toe pain when vibration on Seated rocker board up and down x 15 Seated on edge of mat:  Alt LAQ x 10, 5 sec holds with core engaged but seated on mat- pt reported mild stomach irritation; alt march x 10 each (alternated LAQ with the marching); VC's for core engaged for stability throughout with some soreness noted in core Hip abduction with yellow loop x 10 reps with 3 sec holds, ball squeezes with 3 sec holds x 15 Feet on rocker board  x 10 reps in to DF and PF Standing in // bars for following: Standing hip SLR into flex, and ext x 10 reps with VC's for correct technique; pt with slow, controlled motions NuStep: Level 3, - no UEs so pt can focus on legs, seat at 10 x 7 min - 228 steps Self Care Educated pt on what is normal to feel after therapy (soreness) and what is not (sharp pain), how core soreness is normal and to be expected but need to pace to avoid sharp pain but could still be possibly related to new chemo  04/07/24: Therapeutic Exercises and Acitivities Seated in chair: Started with both feet on plate 2 x 30 sec to improve neuropathy set to stretch, 30Hz , 60 sec on low, then removed Rt shoe for 1 foot at a time on vibration plate . Ankle DF/PF AROM with each position. 1 in a long sit  hamstring stretch position and one in a foot flat position. Conts with reports of Lt great toe pain when vibration on Seated rocker board up and down x 15 Seated on pink disc for following:  Alt LAQ 2 x 5, 5 sec holds; alt march x 7 each (alternated LAQ with the marching); VC's for core engaged for stability throughout Hip abduction with yellow loop x 10 reps, ball squeezes with 3 sec holds x 15 Standing in // bars for following: Calf stretch standing on incline at end of bars 2 x 20 each leg Standing 3 way hip SLR into flex, abd and ext with VC's for correct technique; pt with slow, controlled motions, pt reports RPE 4-5 after standing activities seated rest break after NuStep: Level 3, UE 9 /LE 10 x 6 min and 126 steps; first x 3 mins with arms/legs then last x 3 mins with UE's as pt reports she could tell her arms were working more than her legs, seated rest after Self Care During rest breaks spent time educating pt about importance of standing erect as much as able and not letting herself lean forward with her walker. Also answered her questions about how the exercises will help improve her balance and strength.   04/02/24 Seated in chair: 1 foot at a time on vibration plate to improve neuropathy set to relax, 30Hz , 60 sec on low. Ankle DF/PF AROM with each position. 1 in a long sit hamstring stretch position and one in a foot flat position.  Pt reports increased pain in L big toe and complete numbness in R foot Seated rocker board up and down x 5 Foot slide with sock on into toe point x 5  Seated edge of mat: alternating marches x 10 , LAQ with 3 sec holds x 10 each, without shoe to decrease weight,  hip abduction with yellow band x 10 reps, ball squeezes with 3 sec holds x 10 Standing in // bars: calf stretch 2x20 Rt only. Bil hip abduction x 5 with pt able to do the Rt but slow and with effort.   Updated HEP and gave yellow band   03/31/24: Adjusted pt's walker to be appropriate for her  height Seated in chair: 1 foot at a time on vibration plate to improve neuropathy set to relax, 30Hz , 60 sec on low. Pt reports increased pain in L big toe and complete numbness in R foot Seated edge of mat: alternating marches x 10 with therapist providing contact to RLE to help with beginning the movement - the last 2 reps pt was able to  do without any contact, TKE with 3 sec holds x 10 each with therapist hand behind ankle with very minimal assist to get movement started, heel raises x 10 reps bilaterally, toe raises x 10 bilaterally with trace movement noted on R, hip abduction with yellow band x 10 reps, ball squeezes with 3 sec holds x 10 with increased weakness noted on R Standing in // bars: knee flexion x 10 reps on L and 3 reps on R with increased difficulty noted with this. Pt reported dizziness with standing and then required a seated recovery period  03/12/24- demonstrated seated exercises to pt for pt to begin doing at home - marching, knee extension and ankle DF (for R do in side lying without gravity)   PATIENT EDUCATION:  Education details: seated exercises (hip flexion, knee extension, ankle DF), in side lying work on R ankle DF without gravity, use walker instead of cane to decrease fall risk Person educated: Patient Education method: [employer]ator Education comprehension: verbalized understanding  HOME EXERCISE PROGRAM: Seated hip flexion, knee extension, L ankle DF In side lying work on R ankle DF  Access Code: [email or web address] URL: [email or web address] Date: 04/02/2024 Prepared by: Saddie Raw  Exercises - Seated Long Arc Quad  - 1 x daily - 7 x weekly - 1-3 sets - 10 reps - 3 sec hold - Seated March  - 1 x daily - 7 x weekly - 1-3 sets - 10 reps - no hold - Seated Hip Adduction Isometrics with Ball  - 1 x daily - 7 x weekly - 1-3 sets - 10 reps - no hold - Seated Hip Abduction with Resistance  - 1 x daily - 7 x weekly - 1-3 sets - 10 reps - 3  seconds hold - Seated Heel Toe Raises  - 1 x daily - 7 x weekly - 1-3 sets - 10 reps - no hold - Standing Gastroc Stretch at Counter  - 1 x daily - 7 x weekly - 1 sets - 3 reps - 20-30 seconds hold - Standing Hip Abduction with Counter Support  - 1 x daily - 7 x weekly - 1 sets - 5-10 reps - no hold  ASSESSMENT:  CLINICAL IMPRESSION: Pt had increased soreness after last session in her core. She had sharp pains that began 1.5 hours after therapy that she had to take medication for. Once she woke up she felt better. She still has some soreness in her core muscles throughout today's session but held off on having pt sit on foam disc for seated exercises to see if this was what caused her pain. She also started a new chemo. She has not had that pain since. Monitored her pain and soreness throughout today's session with only soreness noted.   OBJECTIVE IMPAIRMENTS: Abnormal gait, decreased activity tolerance, decreased balance, decreased coordination, decreased knowledge of condition, decreased knowledge of use of DME, decreased mobility, difficulty walking, decreased ROM, decreased strength, impaired sensation, postural dysfunction, and pain.   ACTIVITY LIMITATIONS: bending, standing, squatting, sleeping, stairs, transfers, bed mobility, bathing, dressing, and caring for others  PARTICIPATION LIMITATIONS: meal prep, cleaning, laundry, driving, shopping, and community activity  PERSONAL FACTORS: Fitness and Time since onset of injury/illness/exacerbation are also affecting patient's functional outcome.   REHAB POTENTIAL: Good  CLINICAL DECISION MAKING: Unstable/unpredictable  EVALUATION COMPLEXITY: High   GOALS: Goals reviewed with patient? Yes  SHORT TERM GOALS: Target date: 03/30/24  Pt will demonstrate 2+/5 R hip flexion strength to decrease fall risk. Baseline:  Goal status: INITIAL  2.  Pt will obtain an AFO to help with foot drop to decrease fall risk. Baseline:  Goal status:  INITIAL  3.  Pt will demonstrate 2-/5 R ankle DF strength to decrease fall risk.  Baseline:  Goal status: INITIAL   LONG TERM GOALS: Target date: 04/09/24  Pt will report the numbness and tingling in her feet have improved by at least 40% to allow improved function. Baseline:  Goal status: INITIAL  2.  Pt will demonstrate 3/5 R hip DF strength to decrease fall risk and increase independence with ambulation. Baseline:  Goal status: INITIAL  3.  Pt will demonstrate 3/5 R hip flexion strength to help decrease fall risk when ambulating. Baseline:  Goal status: INITIAL  4.  Pt will be independent in a home exercise program for continued stretching and strengthening.  Baseline:  Goal status: INITIAL   PLAN:  PT FREQUENCY: 2x/week  PT DURATION: 4 weeks  PLANNED INTERVENTIONS: [id number]- PT Re-evaluation, 97750- Physical Performance Testing, 97110-Therapeutic exercises, 97530- Therapeutic activity, 97112- Neuromuscular re-education, 97535- Self Care, [id number]- Manual therapy, [phone]- Gait training, [phone]- Orthotic Initial, [phone]- Orthotic/Prosthetic subsequent, Patient/Family education, Balance training, Joint mobilization, Therapeutic exercises, Therapeutic activity, Neuromuscular re-education, Gait training, and Self Care  PLAN FOR NEXT SESSION: Cont gentle LE strengthening especially R ankle and R hip, standing activities and gait training as able, especially depending on when she had her last chemotherapy, AFO?   [employer], PT 04/09/2024, 10:59 AM

## 2024-04-14 ENCOUNTER — Ambulatory Visit: Admitting: Physical Therapy

## 2024-04-14 DIAGNOSIS — C162 Malignant neoplasm of body of stomach: Secondary | ICD-10-CM

## 2024-04-14 DIAGNOSIS — R208 Other disturbances of skin sensation: Secondary | ICD-10-CM

## 2024-04-14 DIAGNOSIS — R2689 Other abnormalities of gait and mobility: Secondary | ICD-10-CM

## 2024-04-14 DIAGNOSIS — M6281 Muscle weakness (generalized): Secondary | ICD-10-CM

## 2024-04-14 NOTE — Therapy (Signed)
 OUTPATIENT PHYSICAL THERAPY  LOWER EXTREMITY ONCOLOGY TREATMENT  Patient Name: Sheryl [name] MRN: [id number] DOB:[birth year], 58 y.o., female Today's Date: 04/14/2024  END OF SESSION:  PT End of Session - 04/14/24 1000     Visit Number 6    Number of Visits 14    Date for Recertification  05/12/24    Authorization Type [employer] authorized 12 visits 03/12/24-05/11/24 [id number]    Authorization - Visit Number 6    Authorization - Number of Visits 12    PT Start Time 0903    PT Stop Time 0954    PT Time Calculation (min) 51 min    Activity Tolerance Patient tolerated treatment well    Behavior During Therapy [facility] for tasks assessed/performed             Past Medical History:  Diagnosis Date   Allergy    Blood transfusion without reported diagnosis    had transfusion with hysterectomy   Cataract    Colon polyps 2012   Diabetes (HCC) 03/13/2021   Diabetes (HCC) 05/21/2019   Family history of breast cancer    Family history of pancreatic cancer    Family history of stomach cancer    Fibroid    gastric ca 03/2022   GERD (gastroesophageal reflux disease)    H/O blood clots    History of hysterectomy    fibroids and heavy cycles   Hypertension    Stomach cancer (HCC) 04/05/22   Biopsy results from Dr Wilene   Past Surgical History:  Procedure Laterality Date   ABDOMINAL HYSTERECTOMY     BIOPSY  04/19/2022   Procedure: BIOPSY;  Surgeon: Wilhelmenia Aloha Raddle., MD;  Location: [facility];  Service: Gastroenterology;;   BIOPSY  03/21/2023   Procedure: BIOPSY;  Surgeon: Wilhelmenia Aloha Raddle., MD;  Location: [facility];  Service: Gastroenterology;;   COLONOSCOPY     ESOPHAGOGASTRODUODENOSCOPY (EGD) WITH PROPOFOL  N/A 04/19/2022   Procedure: ESOPHAGOGASTRODUODENOSCOPY (EGD) WITH PROPOFOL ;  Surgeon: Wilhelmenia Aloha Raddle., MD;  Location: [facility];  Service: Gastroenterology;  Laterality: N/A;   ESOPHAGOGASTRODUODENOSCOPY (EGD) WITH PROPOFOL  N/A  03/21/2023   Procedure: ESOPHAGOGASTRODUODENOSCOPY (EGD) WITH PROPOFOL ;  Surgeon: Wilhelmenia Aloha Raddle., MD;  Location: [facility];  Service: Gastroenterology;  Laterality: N/A;   EUS N/A 04/19/2022   Procedure: UPPER ENDOSCOPIC ULTRASOUND (EUS) RADIAL;  Surgeon: Wilhelmenia Aloha Raddle., MD;  Location: [facility];  Service: Gastroenterology;  Laterality: N/A;   EXCISION OF SKIN TAG  05/03/2022   Procedure: EXCISION OF CHEST WALL SKIN LESION;  Surgeon: Dasie Leonor CROME, MD;  Location: [facility];  Service: General;;   EYE SURGERY  1997   Removed cataracts   LAPAROSCOPY N/A 05/03/2022   Procedure: LAPAROSCOPY DIAGNOSTIC WITH PERITONEAL WASHINGS;  Surgeon: Dasie Leonor CROME, MD;  Location: [facility] OR;  Service: General;  Laterality: N/A;   POLYPECTOMY  04/19/2022   Procedure: POLYPECTOMY;  Surgeon: Wilhelmenia Aloha Raddle., MD;  Location: [facility];  Service: Gastroenterology;;   PORTACATH PLACEMENT N/A 05/03/2022   Procedure: INSERTION PORT-A-CATH WITH ULTRASOUND GUIDANCE;  Surgeon: Dasie Leonor CROME, MD;  Location: [facility];  Service: General;  Laterality: N/A;   UPPER GASTROINTESTINAL ENDOSCOPY     Patient Active Problem List   Diagnosis Date Noted   Type 2 diabetes mellitus with diabetic neuropathy, without long-term current use of insulin  (HCC) 03/17/2024   Encounter for general adult medical examination with abnormal findings 03/17/2024   Immunization due 03/17/2024   Need for immunization against influenza 03/13/2023   Gastroesophageal reflux disease  with esophagitis without hemorrhage 03/13/2023   Port-A-Cath in place 05/23/2022   Genetic testing 05/21/2022   Gastric cancer (HCC) 04/13/2022   Hypertension 01/24/2022   Type II diabetes mellitus with manifestations (HCC) 01/24/2022   Need for vaccination 01/24/2022   Gastroesophageal reflux disease without esophagitis 01/23/2022   Hyperlipidemia LDL goal <100 01/23/2022   Diuretic-induced hypokalemia 01/23/2022    PCP: Debby Molt,  MD  REFERRING PROVIDER: [name], MD  REFERRING DIAG: C16.2 (ICD-10-CM) - Malignant neoplasm of body of stomach (HCC)  THERAPY DIAG:  Other disturbances of skin sensation - Plan: PT plan of care cert/re-cert  Muscle weakness (generalized) - Plan: PT plan of care cert/re-cert  Other abnormalities of gait and mobility - Plan: PT plan of care cert/re-cert  Malignant neoplasm of body of stomach (HCC) - Plan: PT plan of care cert/re-cert  ONSET DATE: 05/09/2022  Rationale for Evaluation and Treatment: Rehabilitation  SUBJECTIVE:                                                                                                                                                                                           SUBJECTIVE STATEMENT: I can feel tingling now in the foot that was numb. That just started the other night. The R foot is now not numb like it was.   PERTINENT HISTORY: Metastatic gastric cancer with peritoneal metastasis Disease progression on recent PET scan with increased activity in the stomach and peritoneal nodule. Previous treatments included chemotherapy and antibody therapy with limited response. Current treatment with paclitaxel  causing significant neuropathy.Will be switching to oral chemo on Dec 1 in hopes of decreasing neuropathy  PAIN:  Are you having pain? No NPRS scale: Just numbness bilateral feet 8/10 Pain location: bilateral feet Pain orientation: Bilateral  PAIN TYPE: tingling and numbness Pain description: constant  Aggravating factors: touching the foot (pain increases to 20), walking  Relieving factors: nothing  PRECAUTIONS: Pt reports when she stands for a long time she gets light headed 5-10 min, light headed with bending forward  RED FLAGS: None   WEIGHT BEARING RESTRICTIONS: No  FALLS:  Has patient fallen in last 6 months? Yes. Number of falls 1 (end of Sept 2025) - reports foot turned sideway and she fell on the floor    LIVING  ENVIRONMENT: Lives with: lives with their family Lives in: House/apartment Stairs: No;  Has following equipment at home: Single point cane and Walker - 2 wheeled, unable to drive  OCCUPATION: not working/ disability  LEISURE: does not exercise  PRIOR LEVEL OF FUNCTION: Independent with household mobility with device and Independent with community mobility with device uses wall  for balance in the home, makes small meals but would be unable to carry groceries  PATIENT GOALS: strengthen the feet to be able to drive again   OBJECTIVE: Note: Objective measures were completed at Evaluation unless otherwise noted.  COGNITION: Overall cognitive status: Within functional limits for tasks assessed   SENSATION: Unable to formally assess due to extreme sensitivity in bilateral feet  POSTURE: forward head, rounded shoulders  LOWER EXTREMITY STRENGTH:  MMT Right eval  Hip flexion 2-/5  Hip extension   Hip abduction 2-/5  Hip adduction   Hip internal rotation   Hip external rotation   Knee flexion 3+/5  Knee extension 3+/5  Ankle dorsiflexion 1/5  Ankle plantarflexion   Ankle inversion   Ankle eversion   Great toe extension    (Blank rows = not tested)  MMT LEFT eval  Hip flexion 3/5  Hip extension   Hip abduction 3+/5  Hip adduction   Hip internal rotation   Hip external rotation   Knee flexion 4/5  Knee extension 3+/5  Ankle dorsiflexion 2+/5  Ankle plantarflexion   Ankle inversion   Ankle eversion   Great toe extension     (Blank rows = not tested)  FUNCTIONAL TESTS:  Lower Extremity Functional Scale:  Lower Extremity Functional Score: 11 / 80 = 13.8 %  An LEFS score is interpreted on a scale of 0 to 80, where a higher score indicates better lower extremity function. Scores between 61 and 80 typically represent minimal to normal limitation, scores from 41 to 60 suggest mild to moderate limitation, and scores below 40 indicate moderate to severe limitation. A  score of 0 means a person is unable to perform most activities, while a score of 80 means there is no difficulty.    30 sec sit to stand: Unable   GAIT: Distance walked: 10 ft Assistive device utilized: Single point cane Level of assistance: SBA Comments: slow gait speed, shortened step length, decreased foot clearance, decreased hip flexion on R                                                                                                                            TREATMENT DATE:  04/14/24: Therapeutic Exercises and Acitivities Seated in chair: Started with both feet on plate 2 x 30 sec to improve neuropathy set to stretch with no shoe on bilateral feet 30Hz , then 1x 60 sec on low, 30 Hz in a long sit hamstring stretch position - 1 leg at a time.  Seated rocker board up and down x 30 Seated on edge of mat:  Seated on red disc: Alt LAQ x 10, 5 sec holds with core engaged but seated on mat; seated on red disc: alt march x 10 each; VC's for core engaged for stability throughout  Hip abduction with yellow loop x 10 reps with 3 sec holds, ball squeezes with 3 sec holds x 15 Feet on rocker board x 10 reps in  to DF and PF Standing in // bars for following: Standing hip SLR into flex, abd, and ext x 10 reps with VC's for correct technique; pt with slow, controlled motions with no seated recovery periods today NuStep: Level 4, - no UEs so pt can focus on legs, seat at 9 x 8 min -  415 steps Neuro Re education: In // bars standing on purple foam with no HHA x , then with 1 HHA marching x 10 on purple foam, then with no HHA but CGA shifting weight from L to R  04/09/24: Therapeutic Exercises and Acitivities Seated in chair: Started with both feet on plate 2 x 30 sec to improve neuropathy set to stretch with no shoe on R foot, 30Hz , then 1x 60 sec on low, with both feet on vibration plate while performing ankle DF/PF AROM with each position then 60 sec x in a long sit hamstring stretch  position - 1 leg at a time. Conts with reports of Lt great toe pain when vibration on Seated rocker board up and down x 15 Seated on edge of mat:  Alt LAQ x 10, 5 sec holds with core engaged but seated on mat- pt reported mild stomach irritation; alt march x 10 each (alternated LAQ with the marching); VC's for core engaged for stability throughout with some soreness noted in core Hip abduction with yellow loop x 10 reps with 3 sec holds, ball squeezes with 3 sec holds x 15 Feet on rocker board x 10 reps in to DF and PF Standing in // bars for following: Standing hip SLR into flex, and ext x 10 reps with VC's for correct technique; pt with slow, controlled motions NuStep: Level 3, - no UEs so pt can focus on legs, seat at 10 x 7 min - 228 steps Self Care Educated pt on what is normal to feel after therapy (soreness) and what is not (sharp pain), how core soreness is normal and to be expected but need to pace to avoid sharp pain but could still be possibly related to new chemo  04/07/24: Therapeutic Exercises and Acitivities Seated in chair: Started with both feet on plate 2 x 30 sec to improve neuropathy set to stretch, 30Hz , 60 sec on low, then removed Rt shoe for 1 foot at a time on vibration plate . Ankle DF/PF AROM with each position. 1 in a long sit hamstring stretch position and one in a foot flat position. Conts with reports of Lt great toe pain when vibration on Seated rocker board up and down x 15 Seated on pink disc for following:  Alt LAQ 2 x 5, 5 sec holds; alt march x 7 each (alternated LAQ with the marching); VC's for core engaged for stability throughout Hip abduction with yellow loop x 10 reps, ball squeezes with 3 sec holds x 15 Standing in // bars for following: Calf stretch standing on incline at end of bars 2 x 20 each leg Standing 3 way hip SLR into flex, abd and ext with VC's for correct technique; pt with slow, controlled motions, pt reports RPE 4-5 after standing  activities seated rest break after NuStep: Level 3, UE 9 /LE 10 x 6 min and 126 steps; first x 3 mins with arms/legs then last x 3 mins with UE's as pt reports she could tell her arms were working more than her legs, seated rest after Self Care During rest breaks spent time educating pt about importance of standing erect as much  as able and not letting herself lean forward with her walker. Also answered her questions about how the exercises will help improve her balance and strength.   04/02/24 Seated in chair: 1 foot at a time on vibration plate to improve neuropathy set to relax, 30Hz , 60 sec on low. Ankle DF/PF AROM with each position. 1 in a long sit hamstring stretch position and one in a foot flat position.  Pt reports increased pain in L big toe and complete numbness in R foot Seated rocker board up and down x 5 Foot slide with sock on into toe point x 5  Seated edge of mat: alternating marches x 10 , LAQ with 3 sec holds x 10 each, without shoe to decrease weight,  hip abduction with yellow band x 10 reps, ball squeezes with 3 sec holds x 10 Standing in // bars: calf stretch 2x20 Rt only. Bil hip abduction x 5 with pt able to do the Rt but slow and with effort.   Updated HEP and gave yellow band   03/31/24: Adjusted pt's walker to be appropriate for her height Seated in chair: 1 foot at a time on vibration plate to improve neuropathy set to relax, 30Hz , 60 sec on low. Pt reports increased pain in L big toe and complete numbness in R foot Seated edge of mat: alternating marches x 10 with therapist providing contact to RLE to help with beginning the movement - the last 2 reps pt was able to do without any contact, TKE with 3 sec holds x 10 each with therapist hand behind ankle with very minimal assist to get movement started, heel raises x 10 reps bilaterally, toe raises x 10 bilaterally with trace movement noted on R, hip abduction with yellow band x 10 reps, ball squeezes with 3 sec holds x  10 with increased weakness noted on R Standing in // bars: knee flexion x 10 reps on L and 3 reps on R with increased difficulty noted with this. Pt reported dizziness with standing and then required a seated recovery period  03/12/24- demonstrated seated exercises to pt for pt to begin doing at home - marching, knee extension and ankle DF (for R do in side lying without gravity)   PATIENT EDUCATION:  Education details: seated exercises (hip flexion, knee extension, ankle DF), in side lying work on R ankle DF without gravity, use walker instead of cane to decrease fall risk Person educated: Patient Education method: Medical Illustrator Education comprehension: verbalized understanding  HOME EXERCISE PROGRAM: Seated hip flexion, knee extension, L ankle DF In side lying work on R ankle DF  Access Code: [email or web address] URL: [email or web address] Date: 04/02/2024 Prepared by: Saddie Raw  Exercises - Seated Long Arc Quad  - 1 x daily - 7 x weekly - 1-3 sets - 10 reps - 3 sec hold - Seated March  - 1 x daily - 7 x weekly - 1-3 sets - 10 reps - no hold - Seated Hip Adduction Isometrics with Ball  - 1 x daily - 7 x weekly - 1-3 sets - 10 reps - no hold - Seated Hip Abduction with Resistance  - 1 x daily - 7 x weekly - 1-3 sets - 10 reps - 3 seconds hold - Seated Heel Toe Raises  - 1 x daily - 7 x weekly - 1-3 sets - 10 reps - no hold - Standing Gastroc Stretch at Counter  - 1 x daily - 7 x weekly - 1 sets -  3 reps - 20-30 seconds hold - Standing Hip Abduction with Counter Support  - 1 x daily - 7 x weekly - 1 sets - 5-10 reps - no hold  ASSESSMENT:  CLINICAL IMPRESSION: Pt is demonstrating great improvement in therapy. She did not have any pain or soreness after last session. She is demonstrating improved postural awareness during exercises. She was able to stand for all standing exercises today with no seated recovery periods. She is demonstrating improved DF today with pt  able to move through partial ROM against gravity which she was unable to do before. Her R foot is no longer numb and she is now feeling pins and needles in her R foot. She is progressing towards her goals in therapy and would benefit from additional skilled PT services to continue to improve LE strength to decrease fall risk and to decrease bilateral neuropathy to decrease fall risk and improve comfort.   OBJECTIVE IMPAIRMENTS: Abnormal gait, decreased activity tolerance, decreased balance, decreased coordination, decreased knowledge of condition, decreased knowledge of use of DME, decreased mobility, difficulty walking, decreased ROM, decreased strength, impaired sensation, postural dysfunction, and pain.   ACTIVITY LIMITATIONS: bending, standing, squatting, sleeping, stairs, transfers, bed mobility, bathing, dressing, and caring for others  PARTICIPATION LIMITATIONS: meal prep, cleaning, laundry, driving, shopping, and community activity  PERSONAL FACTORS: Fitness and Time since onset of injury/illness/exacerbation are also affecting patient's functional outcome.   REHAB POTENTIAL: Good  CLINICAL DECISION MAKING: Unstable/unpredictable  EVALUATION COMPLEXITY: High   GOALS: Goals reviewed with patient? Yes  SHORT TERM GOALS: Target date: 03/30/24  Pt will demonstrate 2+/5 R hip flexion strength to decrease fall risk. Baseline: Goal status: ONGOING  2.  Pt will obtain an AFO to help with foot drop to decrease fall risk. Baseline:  Goal status: ONGOING - deferred currently due to inability to tolerate anything touching her foot  3.  Pt will demonstrate 2-/5 R ankle DF strength to decrease fall risk.  Baseline:  Goal status: ONGOING - pt beginning to demonstrate some movement against gravity   LONG TERM GOALS: Target date: 04/09/24  Pt will report the numbness and tingling in her feet have improved by at least 40% to allow improved function. Baseline:  Goal status: INITIAL  2.   Pt will demonstrate 3/5 R hip DF strength to decrease fall risk and increase independence with ambulation. Baseline:  Goal status: ONGOING  3.  Pt will demonstrate 3/5 R hip flexion strength to help decrease fall risk when ambulating. Baseline:  Goal status: ONGOING  4.  Pt will be independent in a home exercise program for continued stretching and strengthening.  Baseline:  Goal status: ONGOING   PLAN:  PT FREQUENCY: 2x/week  PT DURATION: 4 weeks  PLANNED INTERVENTIONS: 02835- PT Re-evaluation, 97750- Physical Performance Testing, 97110-Therapeutic exercises, 97530- Therapeutic activity, [id number]- Neuromuscular re-education, 97535- Self Care, [id number]- Manual therapy, [phone]- Gait training, [phone]- Orthotic Initial, [phone]- Orthotic/Prosthetic subsequent, Patient/Family education, Balance training, Joint mobilization, Therapeutic exercises, Therapeutic activity, Neuromuscular re-education, Gait training, and Self Care  PLAN FOR NEXT SESSION: Cont gentle LE strengthening especially R ankle and R hip, standing activities and gait training as able, especially depending on when she had her last chemotherapy, AFO?   [facility], PT 04/14/2024, 12:09 PM

## 2024-04-16 ENCOUNTER — Other Ambulatory Visit: Payer: Self-pay

## 2024-04-16 ENCOUNTER — Encounter: Payer: Self-pay | Admitting: Physical Therapy

## 2024-04-16 ENCOUNTER — Ambulatory Visit

## 2024-04-16 ENCOUNTER — Telehealth: Payer: Self-pay | Admitting: Dietician

## 2024-04-16 ENCOUNTER — Inpatient Hospital Stay: Admitting: Dietician

## 2024-04-16 ENCOUNTER — Other Ambulatory Visit (HOSPITAL_COMMUNITY): Payer: Self-pay

## 2024-04-16 DIAGNOSIS — R2689 Other abnormalities of gait and mobility: Secondary | ICD-10-CM

## 2024-04-16 DIAGNOSIS — R208 Other disturbances of skin sensation: Secondary | ICD-10-CM | POA: Diagnosis not present

## 2024-04-16 DIAGNOSIS — C162 Malignant neoplasm of body of stomach: Secondary | ICD-10-CM

## 2024-04-16 DIAGNOSIS — M6281 Muscle weakness (generalized): Secondary | ICD-10-CM

## 2024-04-16 NOTE — Therapy (Signed)
 OUTPATIENT PHYSICAL THERAPY  LOWER EXTREMITY ONCOLOGY TREATMENT  Patient Name: Sheryl [name] MRN: [id number] DOB:06-Jul-1965, 58 y.o., female Today's Date: 04/16/2024  END OF SESSION:  PT End of Session - 04/16/24 1000     Visit Number 7    Number of Visits 14    Date for Recertification  05/12/24    Authorization Type [employer] authorized 12 visits 03/12/24-05/11/24 [id number]    Authorization - Visit Number 7    Authorization - Number of Visits 12    PT Start Time 0902    PT Stop Time 0952    PT Time Calculation (min) 50 min    Activity Tolerance Patient tolerated treatment well    Behavior During Therapy [facility] for tasks assessed/performed              Past Medical History:  Diagnosis Date   Allergy    Blood transfusion without reported diagnosis    had transfusion with hysterectomy   Cataract    Colon polyps 2012   Diabetes (HCC) 03/13/2021   Diabetes (HCC) 05/21/2019   Family history of breast cancer    Family history of pancreatic cancer    Family history of stomach cancer    Fibroid    gastric ca 03/2022   GERD (gastroesophageal reflux disease)    H/O blood clots    History of hysterectomy    fibroids and heavy cycles   Hypertension    Stomach cancer (HCC) 04/05/22   Biopsy results from Dr Wilene   Past Surgical History:  Procedure Laterality Date   ABDOMINAL HYSTERECTOMY     BIOPSY  04/19/2022   Procedure: BIOPSY;  Surgeon: Wilhelmenia Aloha Raddle., MD;  Location: [facility];  Service: Gastroenterology;;   BIOPSY  03/21/2023   Procedure: BIOPSY;  Surgeon: Wilhelmenia Aloha Raddle., MD;  Location: [facility];  Service: Gastroenterology;;   COLONOSCOPY     ESOPHAGOGASTRODUODENOSCOPY (EGD) WITH PROPOFOL  N/A 04/19/2022   Procedure: ESOPHAGOGASTRODUODENOSCOPY (EGD) WITH PROPOFOL ;  Surgeon: Wilhelmenia Aloha Raddle., MD;  Location: [facility];  Service: Gastroenterology;  Laterality: N/A;   ESOPHAGOGASTRODUODENOSCOPY (EGD) WITH PROPOFOL  N/A  03/21/2023   Procedure: ESOPHAGOGASTRODUODENOSCOPY (EGD) WITH PROPOFOL ;  Surgeon: Wilhelmenia Aloha Raddle., MD;  Location: [facility];  Service: Gastroenterology;  Laterality: N/A;   EUS N/A 04/19/2022   Procedure: UPPER ENDOSCOPIC ULTRASOUND (EUS) RADIAL;  Surgeon: Wilhelmenia Aloha Raddle., MD;  Location: [facility];  Service: Gastroenterology;  Laterality: N/A;   EXCISION OF SKIN TAG  05/03/2022   Procedure: EXCISION OF CHEST WALL SKIN LESION;  Surgeon: Dasie Leonor CROME, MD;  Location: [facility];  Service: General;;   EYE SURGERY  1997   Removed cataracts   LAPAROSCOPY N/A 05/03/2022   Procedure: LAPAROSCOPY DIAGNOSTIC WITH PERITONEAL WASHINGS;  Surgeon: Dasie Leonor CROME, MD;  Location: [facility];  Service: General;  Laterality: N/A;   POLYPECTOMY  04/19/2022   Procedure: POLYPECTOMY;  Surgeon: Wilhelmenia Aloha Raddle., MD;  Location: [facility];  Service: Gastroenterology;;   PORTACATH PLACEMENT N/A 05/03/2022   Procedure: INSERTION PORT-A-CATH WITH ULTRASOUND GUIDANCE;  Surgeon: Dasie Leonor CROME, MD;  Location: [facility];  Service: General;  Laterality: N/A;   UPPER GASTROINTESTINAL ENDOSCOPY     Patient Active Problem List   Diagnosis Date Noted   Type 2 diabetes mellitus with diabetic neuropathy, without long-term current use of insulin  (HCC) 03/17/2024   Encounter for general adult medical examination with abnormal findings 03/17/2024   Immunization due 03/17/2024   Need for immunization against influenza 03/13/2023   Gastroesophageal reflux  disease with esophagitis without hemorrhage 03/13/2023   Port-A-Cath in place 05/23/2022   Genetic testing 05/21/2022   Gastric cancer (HCC) 04/13/2022   Hypertension 01/24/2022   Type II diabetes mellitus with manifestations (HCC) 01/24/2022   Need for vaccination 01/24/2022   Gastroesophageal reflux disease without esophagitis 01/23/2022   Hyperlipidemia LDL goal <100 01/23/2022   Diuretic-induced hypokalemia 01/23/2022    PCP: Debby Molt,  MD  REFERRING PROVIDER: [name], MD  REFERRING DIAG: C16.2 (ICD-10-CM) - Malignant neoplasm of body of stomach (HCC)  THERAPY DIAG:  Other disturbances of skin sensation  Muscle weakness (generalized)  Other abnormalities of gait and mobility  Malignant neoplasm of body of stomach (HCC)  ONSET DATE: 05/09/2022  Rationale for Evaluation and Treatment: Rehabilitation  SUBJECTIVE:                                                                                                                                                                                           SUBJECTIVE STATEMENT: I think I am going to have a good day today. Yesterday I probably wouldn't have. My R foot is numb again.   PERTINENT HISTORY: Metastatic gastric cancer with peritoneal metastasis Disease progression on recent PET scan with increased activity in the stomach and peritoneal nodule. Previous treatments included chemotherapy and antibody therapy with limited response. Current treatment with paclitaxel  causing significant neuropathy.Will be switching to oral chemo on Dec 1 in hopes of decreasing neuropathy  PAIN:  Are you having pain? No NPRS scale: Just numbness/tingling bilateral feet 7/10 Pain location: bilateral feet Pain orientation: Bilateral  PAIN TYPE: tingling and numbness Pain description: constant  Aggravating factors: touching the foot (pain increases to 20), walking  Relieving factors: nothing  PRECAUTIONS: Pt reports when she stands for a long time she gets light headed 5-10 min, light headed with bending forward  RED FLAGS: None   WEIGHT BEARING RESTRICTIONS: No  FALLS:  Has patient fallen in last 6 months? Yes. Number of falls 1 (end of Sept 2025) - reports foot turned sideway and she fell on the floor    LIVING ENVIRONMENT: Lives with: lives with their family Lives in: House/apartment Stairs: No;  Has following equipment at home: Single point cane and Walker - 2 wheeled,  unable to drive  OCCUPATION: not working/ disability  LEISURE: does not exercise  PRIOR LEVEL OF FUNCTION: Independent with household mobility with device and Independent with community mobility with device uses wall for balance in the home, makes small meals but would be unable to carry groceries  PATIENT GOALS: strengthen the feet to be able to drive again   OBJECTIVE: Note: Objective  measures were completed at Evaluation unless otherwise noted.  COGNITION: Overall cognitive status: Within functional limits for tasks assessed   SENSATION: Unable to formally assess due to extreme sensitivity in bilateral feet  POSTURE: forward head, rounded shoulders  LOWER EXTREMITY STRENGTH:  MMT Right eval  Hip flexion 2-/5  Hip extension   Hip abduction 2-/5  Hip adduction   Hip internal rotation   Hip external rotation   Knee flexion 3+/5  Knee extension 3+/5  Ankle dorsiflexion 1/5  Ankle plantarflexion   Ankle inversion   Ankle eversion   Great toe extension    (Blank rows = not tested)  MMT LEFT eval  Hip flexion 3/5  Hip extension   Hip abduction 3+/5  Hip adduction   Hip internal rotation   Hip external rotation   Knee flexion 4/5  Knee extension 3+/5  Ankle dorsiflexion 2+/5  Ankle plantarflexion   Ankle inversion   Ankle eversion   Great toe extension     (Blank rows = not tested)  FUNCTIONAL TESTS:  Lower Extremity Functional Scale:  Lower Extremity Functional Score: 11 / 80 = 13.8 %  An LEFS score is interpreted on a scale of 0 to 80, where a higher score indicates better lower extremity function. Scores between 61 and 80 typically represent minimal to normal limitation, scores from 41 to 60 suggest mild to moderate limitation, and scores below 40 indicate moderate to severe limitation. A score of 0 means a person is unable to perform most activities, while a score of 80 means there is no difficulty.    30 sec sit to stand: Unable   GAIT: Distance  walked: 10 ft Assistive device utilized: Single point cane Level of assistance: SBA Comments: slow gait speed, shortened step length, decreased foot clearance, decreased hip flexion on R                                                                                                                            TREATMENT DATE:  04/16/24: Therapeutic Exercises and Acitivities Seated in chair: Started with both feet on plate 2 x 60 sec to improve neuropathy set to stretch with no shoe on bilateral feet 30Hz , then 1x 60 sec on low, 30 Hz in a long sit hamstring stretch position - 1 leg at a time.  Seated rocker board up and down x 30 reps Seated on edge of mat:  Seated on red disc: Alt LAQ with 1 lb ankle weights x 10, 5 sec holds with core engaged but seated on mat; seated on red disc: alt march x 10 each; VC's for core engaged for stability throughout , hip abduction with red loop x 10 reps with 5 sec holds, ball squeezes with 5 sec holds x 10 Feet on rocker board x 10 reps in to DF and PF Standing in // bars for following: Standing on blue foam oval: hip SLR into flex, abd, and ext x 10 reps with  VC's for correct technique; pt with slow, controlled motions with no seated recovery periods today NuStep: Level 4, - no UEs so pt can focus on legs, seat at 9 x 8 min -  415 steps Neuro Re education: In // bars marching on purple foam with no HHA x 7 reps 04/14/24: Therapeutic Exercises and Acitivities Seated in chair: Started with both feet on plate 2 x 30 sec to improve neuropathy set to stretch with no shoe on bilateral feet 30Hz , then 1x 60 sec on low, 30 Hz in a long sit hamstring stretch position - 1 leg at a time.  Seated rocker board up and down x 30 Seated on edge of mat:  Seated on red disc: Alt LAQ x 10, 5 sec holds with core engaged but seated on mat; seated on red disc: alt march x 10 each; VC's for core engaged for stability throughout  Hip abduction with yellow loop x 10 reps with 3 sec  holds, ball squeezes with 3 sec holds x 15 Feet on rocker board x 10 reps in to DF and PF Standing in // bars for following: Standing hip SLR into flex, abd, and ext x 10 reps with VC's for correct technique; pt with slow, controlled motions with no seated recovery periods today NuStep: Level 4, - no UEs so pt can focus on legs, seat at 9 x 10 min -  537 steps Neuro Re education: In // bars standing on purple foam with no HHA x , then with 1 HHA marching x 10 on purple foam, then with no HHA but CGA shifting weight from L to R  04/09/24: Therapeutic Exercises and Acitivities Seated in chair: Started with both feet on plate 2 x 30 sec to improve neuropathy set to stretch with no shoe on R foot, 30Hz , then 1x 60 sec on low, with both feet on vibration plate while performing ankle DF/PF AROM with each position then 60 sec x in a long sit hamstring stretch position - 1 leg at a time. Conts with reports of Lt great toe pain when vibration on Seated rocker board up and down x 15 Seated on edge of mat:  Alt LAQ x 10, 5 sec holds with core engaged but seated on mat- pt reported mild stomach irritation; alt march x 10 each (alternated LAQ with the marching); VC's for core engaged for stability throughout with some soreness noted in core Hip abduction with yellow loop x 10 reps with 3 sec holds, ball squeezes with 3 sec holds x 15 Feet on rocker board x 10 reps in to DF and PF Standing in // bars for following: Standing hip SLR into flex, and ext x 10 reps with VC's for correct technique; pt with slow, controlled motions NuStep: Level 3, - no UEs so pt can focus on legs, seat at 10 x 7 min - 228 steps Self Care Educated pt on what is normal to feel after therapy (soreness) and what is not (sharp pain), how core soreness is normal and to be expected but need to pace to avoid sharp pain but could still be possibly related to new chemo  04/07/24: Therapeutic Exercises and Acitivities Seated in chair:  Started with both feet on plate 2 x 30 sec to improve neuropathy set to stretch, 30Hz , 60 sec on low, then removed Rt shoe for 1 foot at a time on vibration plate . Ankle DF/PF AROM with each position. 1 in a long sit hamstring stretch position and  one in a foot flat position. Conts with reports of Lt great toe pain when vibration on Seated rocker board up and down x 15 Seated on pink disc for following:  Alt LAQ 2 x 5, 5 sec holds; alt march x 7 each (alternated LAQ with the marching); VC's for core engaged for stability throughout Hip abduction with yellow loop x 10 reps, ball squeezes with 3 sec holds x 15 Standing in // bars for following: Calf stretch standing on incline at end of bars 2 x 20 each leg Standing 3 way hip SLR into flex, abd and ext with VC's for correct technique; pt with slow, controlled motions, pt reports RPE 4-5 after standing activities seated rest break after NuStep: Level 3, UE 9 /LE 10 x 6 min and 126 steps; first x 3 mins with arms/legs then last x 3 mins with UE's as pt reports she could tell her arms were working more than her legs, seated rest after Self Care During rest breaks spent time educating pt about importance of standing erect as much as able and not letting herself lean forward with her walker. Also answered her questions about how the exercises will help improve her balance and strength.   04/02/24 Seated in chair: 1 foot at a time on vibration plate to improve neuropathy set to relax, 30Hz , 60 sec on low. Ankle DF/PF AROM with each position. 1 in a long sit hamstring stretch position and one in a foot flat position.  Pt reports increased pain in L big toe and complete numbness in R foot Seated rocker board up and down x 5 Foot slide with sock on into toe point x 5  Seated edge of mat: alternating marches x 10 , LAQ with 3 sec holds x 10 each, without shoe to decrease weight,  hip abduction with yellow band x 10 reps, ball squeezes with 3 sec holds x  10 Standing in // bars: calf stretch 2x20 Rt only. Bil hip abduction x 5 with pt able to do the Rt but slow and with effort.   Updated HEP and gave yellow band   03/31/24: Adjusted pt's walker to be appropriate for her height Seated in chair: 1 foot at a time on vibration plate to improve neuropathy set to relax, 30Hz , 60 sec on low. Pt reports increased pain in L big toe and complete numbness in R foot Seated edge of mat: alternating marches x 10 with therapist providing contact to RLE to help with beginning the movement - the last 2 reps pt was able to do without any contact, TKE with 3 sec holds x 10 each with therapist hand behind ankle with very minimal assist to get movement started, heel raises x 10 reps bilaterally, toe raises x 10 bilaterally with trace movement noted on R, hip abduction with yellow band x 10 reps, ball squeezes with 3 sec holds x 10 with increased weakness noted on R Standing in // bars: knee flexion x 10 reps on L and 3 reps on R with increased difficulty noted with this. Pt reported dizziness with standing and then required a seated recovery period  03/12/24- demonstrated seated exercises to pt for pt to begin doing at home - marching, knee extension and ankle DF (for R do in side lying without gravity)   PATIENT EDUCATION:  Education details: seated exercises (hip flexion, knee extension, ankle DF), in side lying work on R ankle DF without gravity, use walker instead of cane to decrease fall  risk Person educated: Patient Education method: Medical Illustrator Education comprehension: verbalized understanding  HOME EXERCISE PROGRAM: Seated hip flexion, knee extension, L ankle DF In side lying work on R ankle DF  Access Code: [email or web address] URL: [email or web address] Date: 04/02/2024 Prepared by: Saddie Raw  Exercises - Seated Long Arc Quad  - 1 x daily - 7 x weekly - 1-3 sets - 10 reps - 3 sec hold - Seated March  - 1 x daily - 7 x weekly -  1-3 sets - 10 reps - no hold - Seated Hip Adduction Isometrics with Ball  - 1 x daily - 7 x weekly - 1-3 sets - 10 reps - no hold - Seated Hip Abduction with Resistance  - 1 x daily - 7 x weekly - 1-3 sets - 10 reps - 3 seconds hold - Seated Heel Toe Raises  - 1 x daily - 7 x weekly - 1-3 sets - 10 reps - no hold - Standing Gastroc Stretch at Counter  - 1 x daily - 7 x weekly - 1 sets - 3 reps - 20-30 seconds hold - Standing Hip Abduction with Counter Support  - 1 x daily - 7 x weekly - 1 sets - 5-10 reps - no hold  ASSESSMENT:  CLINICAL IMPRESSION: Pt did not have any abdominal pain after incorporating the red disc for core exercises last session. Added 1lb ankle weights today to seated exercises and pt did well with this. Also increased resistance of seated hip abduction. Pt did not require any seated recovery periods during this session. She is demonstrating increased stregnth in bilateral feet and hips compared to eval.   OBJECTIVE IMPAIRMENTS: Abnormal gait, decreased activity tolerance, decreased balance, decreased coordination, decreased knowledge of condition, decreased knowledge of use of DME, decreased mobility, difficulty walking, decreased ROM, decreased strength, impaired sensation, postural dysfunction, and pain.   ACTIVITY LIMITATIONS: bending, standing, squatting, sleeping, stairs, transfers, bed mobility, bathing, dressing, and caring for others  PARTICIPATION LIMITATIONS: meal prep, cleaning, laundry, driving, shopping, and community activity  PERSONAL FACTORS: Fitness and Time since onset of injury/illness/exacerbation are also affecting patient's functional outcome.   REHAB POTENTIAL: Good  CLINICAL DECISION MAKING: Unstable/unpredictable  EVALUATION COMPLEXITY: High   GOALS: Goals reviewed with patient? Yes  SHORT TERM GOALS: Target date: 03/30/24  Pt will demonstrate 2+/5 R hip flexion strength to decrease fall risk. Baseline: Goal status: ONGOING  2.  Pt will  obtain an AFO to help with foot drop to decrease fall risk. Baseline:  Goal status: ONGOING - deferred currently due to inability to tolerate anything touching her foot  3.  Pt will demonstrate 2-/5 R ankle DF strength to decrease fall risk.  Baseline:  Goal status: ONGOING - pt beginning to demonstrate some movement against gravity   LONG TERM GOALS: Target date: 04/09/24  Pt will report the numbness and tingling in her feet have improved by at least 40% to allow improved function. Baseline:  Goal status: INITIAL  2.  Pt will demonstrate 3/5 R hip DF strength to decrease fall risk and increase independence with ambulation. Baseline:  Goal status: ONGOING  3.  Pt will demonstrate 3/5 R hip flexion strength to help decrease fall risk when ambulating. Baseline:  Goal status: ONGOING  4.  Pt will be independent in a home exercise program for continued stretching and strengthening.  Baseline:  Goal status: ONGOING   PLAN:  PT FREQUENCY: 2x/week  PT DURATION: 4 weeks  PLANNED INTERVENTIONS: 02835- PT Re-evaluation,  02249- Physical Performance Testing, 97110-Therapeutic exercises, 97530- Therapeutic activity, [id number]- Neuromuscular re-education, [phone]- Self Care, 02859- Manual therapy, [phone]- Gait training, [phone]- Orthotic Initial, [phone]- Orthotic/Prosthetic subsequent, Patient/Family education, Balance training, Joint mobilization, Therapeutic exercises, Therapeutic activity, Neuromuscular re-education, Gait training, and Self Care  PLAN FOR NEXT SESSION: Remeasure strength, Cont gentle LE strengthening especially R ankle and R hip, standing activities and gait training as able, especially depending on when she had her last chemotherapy, AFO?   [employer], PT 04/16/2024, 10:04 AM

## 2024-04-16 NOTE — Telephone Encounter (Signed)
 Nutrition Follow-up:  Patient with gastric cancer. She is currently receiving reduced dose Zolbetuximab/5FU + Leucovorin  q42d (start 07/09/23). Regimen discontinued 8/13 due to progression. Dose reduced paclitaxel  + ramucirumab  stopped. She is now receiving oral [name] . Patient is under the care of Dr. Lanny.   Spoke with patient via telephone. She is sleeping at time of call. Sister present via speaker phone. Patient had PT earlier today. She enjoys going. Poor appetite persists. Yesterday had half corn beef sandwich for breakfast and other half at dinner. Drinking half of Glucerna each day. She has had a few bites of a sandwich today. Planning to try a pudding cup. Reports vomiting episodes Friday and Saturday. Says she woke up out of her sleep vomiting Friday night. Saturday she was sitting with her sister who had brought chick fila salad for their mother. Patient said she threw up sitting there just talking about food.    Medications: reviewed   Labs: 12/1 - reviewed   Anthropometrics: Last wt 149 lb 12.8 oz on 12/1 - pt reports 143 lb on home scale this morning  11/18 - 151 lb 10/29 - 154 lb  10/1 - 158 lb 4.8 oz    NUTRITION DIAGNOSIS: Unintended wt loss - continues   INTERVENTION:  Continue to encourage bites sips q2-3h Encourage protein snack after PT Discussed foods likely to be poorly tolerated with sister (fried/greasy foods, heavily processed, raw F/V) Encourage increasing glucerna as tolerated Encouraged pt to move her body  Support and encouragement     MONITORING, EVALUATION, GOAL: wt trends, intake   NEXT VISIT: Wednesday January 7 via telephone

## 2024-04-17 ENCOUNTER — Other Ambulatory Visit: Payer: Self-pay

## 2024-04-17 NOTE — Progress Notes (Signed)
 Specialty Pharmacy Refill Coordination Note  Sheryl [name] is a 58 y.o. female contacted today regarding refills of specialty medication(s) Trifluridine -Tipiracil  ([name] )   Patient requested Delivery   Delivery date: 04/24/24   Verified address: [address] [place], [place] [address]   Medication will be filled on: 04/22/24

## 2024-04-17 NOTE — Progress Notes (Signed)
 Specialty Pharmacy Ongoing Clinical Assessment Note  Sheryl [name] is a 57 y.o. female who is being followed by the specialty pharmacy service for RxSp Oncology   Patient's specialty medication(s) reviewed today: Trifluridine -Tipiracil  ([name] )   Missed doses in the last 4 weeks: 0   Patient/Caregiver did not have any additional questions or concerns.   Therapeutic benefit summary: Unable to assess   Adverse events/side effects summary: No adverse events/side effects   Patient's therapy is appropriate to: Continue    Goals Addressed             This Visit's Progress    Slow Disease Progression       Patient is unable to be assessed as therapy was recently initiated. Patient will maintain adherence         Follow up: 3 months  [facility] Specialty Pharmacist

## 2024-04-21 ENCOUNTER — Other Ambulatory Visit: Payer: Self-pay

## 2024-04-21 ENCOUNTER — Ambulatory Visit

## 2024-04-22 ENCOUNTER — Other Ambulatory Visit: Payer: Self-pay

## 2024-04-24 ENCOUNTER — Other Ambulatory Visit: Payer: Self-pay

## 2024-04-25 ENCOUNTER — Other Ambulatory Visit (HOSPITAL_COMMUNITY): Payer: Self-pay

## 2024-04-27 ENCOUNTER — Other Ambulatory Visit: Payer: Self-pay

## 2024-04-27 ENCOUNTER — Other Ambulatory Visit (HOSPITAL_COMMUNITY): Payer: Self-pay

## 2024-04-28 ENCOUNTER — Other Ambulatory Visit: Payer: Self-pay

## 2024-04-28 ENCOUNTER — Ambulatory Visit: Admitting: Physical Therapy

## 2024-04-28 ENCOUNTER — Other Ambulatory Visit: Payer: Self-pay | Admitting: Nurse Practitioner

## 2024-04-28 ENCOUNTER — Encounter: Payer: Self-pay | Admitting: Physical Therapy

## 2024-04-28 DIAGNOSIS — R208 Other disturbances of skin sensation: Secondary | ICD-10-CM

## 2024-04-28 DIAGNOSIS — C162 Malignant neoplasm of body of stomach: Secondary | ICD-10-CM

## 2024-04-28 DIAGNOSIS — M6281 Muscle weakness (generalized): Secondary | ICD-10-CM

## 2024-04-28 DIAGNOSIS — R2689 Other abnormalities of gait and mobility: Secondary | ICD-10-CM

## 2024-04-28 NOTE — Assessment & Plan Note (Signed)
 [id number] with peritoneal metastasis. 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   -she started first line chemo FLOT on 05/09/22 -She understands that chemotherapy is palliative, to prolong her life.  We are unlikely going to cure her cancer. -PD-L1 0-1%, very limited benefit from PD-L1 immunotherapy, FO revealed FGFR2 amplification and fusion (+), FGFR inhibitors can be considered in future, no other targeted therapy available  -I changed her chemo from FLOT to FOLFOX on 06/20/2022 -She is not able to return to work due to the cancer and treatment related symptoms.  -I subsequently changed her treatment to maintenance Xeloda  in early August 2024, she is tolerating well overall  -her NGS Caris showed positive Claudin 18.2, she is a candidate for zolbetuximab.  -Repeated EGD on March 21, 2023 showed residual gastric cancer.  We discussed option of changing her chemotherapy back to FOLFOX and add zolbetuximab.  -PET 06/03/2023 showed stable disease (no hypermetabolic disease outside stomach) -Patient developed recurrent abdominal pain, similar to the symptoms she had when she was diagnosed.  She agreed to change treatment back to FOLFOX, and add Zolbetuximab. She started on 07/09/2023. She tolerated first cycle poorly and had prolonged recovery. Oxaliplatin  was stop after cycle 1 due to poor tolerance. She continued 5-fu/LV and zolbe every 2 weeks  -CT 10/10/2023 showed stable disease - Unfortunately PET scan on December 06, 2023 showed worsening peritoneal metastasis.  I recommend change treatment to paclitaxel  and ramucirumab , she started on 12/17/23 -PET 03/02/2024 showed disease progression in stomack and nodes, but Signatera showed improved ctDNA. -chemo changed to [name] on 03/30/2024

## 2024-04-28 NOTE — Therapy (Signed)
 " OUTPATIENT PHYSICAL THERAPY  LOWER EXTREMITY ONCOLOGY TREATMENT  Patient Name: Sheryl [name] MRN: [id number] DOB:10-Sep-1965, 58 y.o., female Today's Date: 04/28/2024  END OF SESSION:  PT End of Session - 04/28/24 0957     Visit Number 8    Number of Visits 14    Date for Recertification  05/12/24    Authorization Type [employer] authorized 12 visits 03/12/24-05/11/24 [id number]    Authorization - Visit Number 8    Authorization - Number of Visits 12    PT Start Time 0902    PT Stop Time 0953    PT Time Calculation (min) 51 min    Activity Tolerance Patient tolerated treatment well    Behavior During Therapy [facility] for tasks assessed/performed               Past Medical History:  Diagnosis Date   Allergy    Blood transfusion without reported diagnosis    had transfusion with hysterectomy   Cataract    Colon polyps 2012   Diabetes (HCC) 03/13/2021   Diabetes (HCC) 05/21/2019   Family history of breast cancer    Family history of pancreatic cancer    Family history of stomach cancer    Fibroid    gastric ca 03/2022   GERD (gastroesophageal reflux disease)    H/O blood clots    History of hysterectomy    fibroids and heavy cycles   Hypertension    Stomach cancer (HCC) 04/05/22   Biopsy results from Dr Wilene   Past Surgical History:  Procedure Laterality Date   ABDOMINAL HYSTERECTOMY     BIOPSY  04/19/2022   Procedure: BIOPSY;  Surgeon: Wilhelmenia Aloha Raddle., MD;  Location: [facility];  Service: Gastroenterology;;   BIOPSY  03/21/2023   Procedure: BIOPSY;  Surgeon: Wilhelmenia Aloha Raddle., MD;  Location: [facility];  Service: Gastroenterology;;   COLONOSCOPY     ESOPHAGOGASTRODUODENOSCOPY (EGD) WITH PROPOFOL  N/A 04/19/2022   Procedure: ESOPHAGOGASTRODUODENOSCOPY (EGD) WITH PROPOFOL ;  Surgeon: Wilhelmenia Aloha Raddle., MD;  Location: [facility];  Service: Gastroenterology;  Laterality: N/A;   ESOPHAGOGASTRODUODENOSCOPY (EGD) WITH PROPOFOL  N/A  03/21/2023   Procedure: ESOPHAGOGASTRODUODENOSCOPY (EGD) WITH PROPOFOL ;  Surgeon: Wilhelmenia Aloha Raddle., MD;  Location: [facility];  Service: Gastroenterology;  Laterality: N/A;   EUS N/A 04/19/2022   Procedure: UPPER ENDOSCOPIC ULTRASOUND (EUS) RADIAL;  Surgeon: Wilhelmenia Aloha Raddle., MD;  Location: [facility];  Service: Gastroenterology;  Laterality: N/A;   EXCISION OF SKIN TAG  05/03/2022   Procedure: EXCISION OF CHEST WALL SKIN LESION;  Surgeon: Dasie Leonor CROME, MD;  Location: [facility];  Service: General;;   EYE SURGERY  1997   Removed cataracts   LAPAROSCOPY N/A 05/03/2022   Procedure: LAPAROSCOPY DIAGNOSTIC WITH PERITONEAL WASHINGS;  Surgeon: Dasie Leonor CROME, MD;  Location: [facility];  Service: General;  Laterality: N/A;   POLYPECTOMY  04/19/2022   Procedure: POLYPECTOMY;  Surgeon: Wilhelmenia Aloha Raddle., MD;  Location: [facility];  Service: Gastroenterology;;   PORTACATH PLACEMENT N/A 05/03/2022   Procedure: INSERTION PORT-A-CATH WITH ULTRASOUND GUIDANCE;  Surgeon: Dasie Leonor CROME, MD;  Location: [facility];  Service: General;  Laterality: N/A;   UPPER GASTROINTESTINAL ENDOSCOPY     Patient Active Problem List   Diagnosis Date Noted   Type 2 diabetes mellitus with diabetic neuropathy, without long-term current use of insulin  (HCC) 03/17/2024   Encounter for general adult medical examination with abnormal findings 03/17/2024   Immunization due 03/17/2024   Need for immunization against influenza 03/13/2023  Gastroesophageal reflux disease with esophagitis without hemorrhage 03/13/2023   Port-A-Cath in place 05/23/2022   Genetic testing 05/21/2022   Gastric cancer (HCC) 04/13/2022   Hypertension 01/24/2022   Type II diabetes mellitus with manifestations (HCC) 01/24/2022   Need for vaccination 01/24/2022   Gastroesophageal reflux disease without esophagitis 01/23/2022   Hyperlipidemia LDL goal <100 01/23/2022   Diuretic-induced hypokalemia 01/23/2022    PCP: Debby Molt,  MD  REFERRING PROVIDER: [name], MD  REFERRING DIAG: C16.2 (ICD-10-CM) - Malignant neoplasm of body of stomach (HCC)  THERAPY DIAG:  Other disturbances of skin sensation  Muscle weakness (generalized)  Other abnormalities of gait and mobility  Malignant neoplasm of body of stomach (HCC)  ONSET DATE: 05/09/2022  Rationale for Evaluation and Treatment: Rehabilitation  SUBJECTIVE:                                                                                                                                                                                           SUBJECTIVE STATEMENT: My feet are acting up. It is about an 8/10. I did fine riding down to [place]  but coming back I was aching all over. I have been having pain every time I eat something. It started Thursday night (Christmas). I was throwing up a lot.   PERTINENT HISTORY: Metastatic gastric cancer with peritoneal metastasis Disease progression on recent PET scan with increased activity in the stomach and peritoneal nodule. Previous treatments included chemotherapy and antibody therapy with limited response. Current treatment with paclitaxel  causing significant neuropathy.Will be switching to oral chemo on Dec 1 in hopes of decreasing neuropathy  PAIN:  Are you having pain? No NPRS scale: Just numbness/tingling bilateral feet 8/10 Pain location: bilateral feet Pain orientation: Bilateral  PAIN TYPE: tingling and numbness Pain description: constant  Aggravating factors: touching the foot (pain increases to 20), walking  Relieving factors: nothing  PRECAUTIONS: Pt reports when she stands for a long time she gets light headed 5-10 min, light headed with bending forward  RED FLAGS: None   WEIGHT BEARING RESTRICTIONS: No  FALLS:  Has patient fallen in last 6 months? Yes. Number of falls 1 (end of Sept 2025) - reports foot turned sideway and she fell on the floor    LIVING ENVIRONMENT: Lives with: lives with their  family Lives in: House/apartment Stairs: No;  Has following equipment at home: Single point cane and Walker - 2 wheeled, unable to drive  OCCUPATION: not working/ disability  LEISURE: does not exercise  PRIOR LEVEL OF FUNCTION: Independent with household mobility with device and Independent with community mobility with device uses wall for balance in the home, makes  small meals but would be unable to carry groceries  PATIENT GOALS: strengthen the feet to be able to drive again   OBJECTIVE: Note: Objective measures were completed at Evaluation unless otherwise noted.  COGNITION: Overall cognitive status: Within functional limits for tasks assessed   SENSATION: Unable to formally assess due to extreme sensitivity in bilateral feet  POSTURE: forward head, rounded shoulders  LOWER EXTREMITY STRENGTH:  MMT Right eval RIGHT 04/28/24  Hip flexion 2-/5 3/5  Hip extension    Hip abduction 2-/5   Hip adduction    Hip internal rotation    Hip external rotation    Knee flexion 3+/5 3+/5  Knee extension 3+/5 4/5  Ankle dorsiflexion 1/5 2+/5  Ankle plantarflexion    Ankle inversion    Ankle eversion    Great toe extension     (Blank rows = not tested)  MMT LEFT eval LEFT 04/28/24  Hip flexion 3/5 3+/5  Hip extension    Hip abduction 3+/5   Hip adduction    Hip internal rotation    Hip external rotation    Knee flexion 4/5 4/5  Knee extension 3+/5 5/5  Ankle dorsiflexion 2+/5 3/5  Ankle plantarflexion    Ankle inversion    Ankle eversion    Great toe extension      (Blank rows = not tested)  FUNCTIONAL TESTS:  Lower Extremity Functional Scale:  Lower Extremity Functional Score: 11 / 80 = 13.8 %  An LEFS score is interpreted on a scale of 0 to 80, where a higher score indicates better lower extremity function. Scores between 61 and 80 typically represent minimal to normal limitation, scores from 41 to 60 suggest mild to moderate limitation, and scores below 40  indicate moderate to severe limitation. A score of 0 means a person is unable to perform most activities, while a score of 80 means there is no difficulty.    30 sec sit to stand: Unable   GAIT: Distance walked: 10 ft Assistive device utilized: Single point cane Level of assistance: SBA Comments: slow gait speed, shortened step length, decreased foot clearance, decreased hip flexion on R                                                                                                                            TREATMENT DATE:  04/28/24: Reassessed bilateral LE strength Therapeutic Exercises and Acitivities Seated in chair: Started with both feet on plate 2 x 60 sec to improve neuropathy set to stretch with no shoe on bilateral feet 30Hz , then 1x 60 sec on low, 30 Hz in a long sit hamstring stretch position - 1 leg at a time with 60 sec hold Seated rocker board up and down x 30 reps Seated on edge of mat:  Seated on red disc: Alt LAQ with 1 lb ankle weights x 10, 5 sec holds with core engaged but seated on mat; seated on red disc: alt march x 10  each; VC's for core engaged for stability throughout , hip abduction with red loop x 10 reps with 5 sec holds, ball squeezes with 5 sec holds x 10 Feet on rocker board x 10 reps in to DF and PF Standing in // bars for following: Standing on blue foam oval: hip SLR into flex, abd, and ext x 10 reps with VC's for correct technique; pt with slow, controlled motions with no seated recovery periods today, used one HHA when standing on L and 2 HHA when standing on R NuStep: Level 4, - no UEs so pt can focus on legs, seat at 9 x 10 min -  541 steps  04/16/24: Therapeutic Exercises and Acitivities Seated in chair: Started with both feet on plate 2 x 60 sec to improve neuropathy set to stretch with no shoe on bilateral feet 30Hz , then 1x 60 sec on low, 30 Hz in a long sit hamstring stretch position - 1 leg at a time.  Seated rocker board up and down x 30  reps Seated on edge of mat:  Seated on red disc: Alt LAQ with 1 lb ankle weights x 10, 5 sec holds with core engaged but seated on mat; seated on red disc: alt march x 10 each; VC's for core engaged for stability throughout , hip abduction with red loop x 10 reps with 5 sec holds, ball squeezes with 5 sec holds x 10 Feet on rocker board x 10 reps in to DF and PF Standing in // bars for following: Standing on blue foam oval: hip SLR into flex, abd, and ext x 10 reps with VC's for correct technique; pt with slow, controlled motions with no seated recovery periods today NuStep: Level 4, - no UEs so pt can focus on legs, seat at 9 x 8 min -  415 steps Neuro Re education: In // bars marching on purple foam with no HHA x 7 reps 04/14/24: Therapeutic Exercises and Acitivities Seated in chair: Started with both feet on plate 2 x 30 sec to improve neuropathy set to stretch with no shoe on bilateral feet 30Hz , then 1x 60 sec on low, 30 Hz in a long sit hamstring stretch position - 1 leg at a time.  Seated rocker board up and down x 30 Seated on edge of mat:  Seated on red disc: Alt LAQ x 10, 5 sec holds with core engaged but seated on mat; seated on red disc: alt march x 10 each; VC's for core engaged for stability throughout  Hip abduction with yellow loop x 10 reps with 3 sec holds, ball squeezes with 3 sec holds x 15 Feet on rocker board x 10 reps in to DF and PF Standing in // bars for following: Standing hip SLR into flex, abd, and ext x 10 reps with VC's for correct technique; pt with slow, controlled motions with no seated recovery periods today NuStep: Level 4, - no UEs so pt can focus on legs, seat at 9 x 10 min -  537 steps Neuro Re education: In // bars standing on purple foam with no HHA x , then with 1 HHA marching x 10 on purple foam, then with no HHA but CGA shifting weight from L to R  04/09/24: Therapeutic Exercises and Acitivities Seated in chair: Started with both feet on plate  2 x 30 sec to improve neuropathy set to stretch with no shoe on R foot, 30Hz , then 1x 60 sec on low, with both feet on  vibration plate while performing ankle DF/PF AROM with each position then 60 sec x in a long sit hamstring stretch position - 1 leg at a time. Conts with reports of Lt great toe pain when vibration on Seated rocker board up and down x 15 Seated on edge of mat:  Alt LAQ x 10, 5 sec holds with core engaged but seated on mat- pt reported mild stomach irritation; alt march x 10 each (alternated LAQ with the marching); VC's for core engaged for stability throughout with some soreness noted in core Hip abduction with yellow loop x 10 reps with 3 sec holds, ball squeezes with 3 sec holds x 15 Feet on rocker board x 10 reps in to DF and PF Standing in // bars for following: Standing hip SLR into flex, and ext x 10 reps with VC's for correct technique; pt with slow, controlled motions NuStep: Level 3, - no UEs so pt can focus on legs, seat at 10 x 7 min - 228 steps Self Care Educated pt on what is normal to feel after therapy (soreness) and what is not (sharp pain), how core soreness is normal and to be expected but need to pace to avoid sharp pain but could still be possibly related to new chemo  04/07/24: Therapeutic Exercises and Acitivities Seated in chair: Started with both feet on plate 2 x 30 sec to improve neuropathy set to stretch, 30Hz , 60 sec on low, then removed Rt shoe for 1 foot at a time on vibration plate . Ankle DF/PF AROM with each position. 1 in a long sit hamstring stretch position and one in a foot flat position. Conts with reports of Lt great toe pain when vibration on Seated rocker board up and down x 15 Seated on pink disc for following:  Alt LAQ 2 x 5, 5 sec holds; alt march x 7 each (alternated LAQ with the marching); VC's for core engaged for stability throughout Hip abduction with yellow loop x 10 reps, ball squeezes with 3 sec holds x 15 Standing in // bars for  following: Calf stretch standing on incline at end of bars 2 x 20 each leg Standing 3 way hip SLR into flex, abd and ext with VC's for correct technique; pt with slow, controlled motions, pt reports RPE 4-5 after standing activities seated rest break after NuStep: Level 3, UE 9 /LE 10 x 6 min and 126 steps; first x 3 mins with arms/legs then last x 3 mins with UE's as pt reports she could tell her arms were working more than her legs, seated rest after Self Care During rest breaks spent time educating pt about importance of standing erect as much as able and not letting herself lean forward with her walker. Also answered her questions about how the exercises will help improve her balance and strength.   04/02/24 Seated in chair: 1 foot at a time on vibration plate to improve neuropathy set to relax, 30Hz , 60 sec on low. Ankle DF/PF AROM with each position. 1 in a long sit hamstring stretch position and one in a foot flat position.  Pt reports increased pain in L big toe and complete numbness in R foot Seated rocker board up and down x 5 Foot slide with sock on into toe point x 5  Seated edge of mat: alternating marches x 10 , LAQ with 3 sec holds x 10 each, without shoe to decrease weight,  hip abduction with yellow band x 10 reps, ball squeezes  with 3 sec holds x 10 Standing in // bars: calf stretch 2x20 Rt only. Bil hip abduction x 5 with pt able to do the Rt but slow and with effort.   Updated HEP and gave yellow band   03/31/24: Adjusted pt's walker to be appropriate for her height Seated in chair: 1 foot at a time on vibration plate to improve neuropathy set to relax, 30Hz , 60 sec on low. Pt reports increased pain in L big toe and complete numbness in R foot Seated edge of mat: alternating marches x 10 with therapist providing contact to RLE to help with beginning the movement - the last 2 reps pt was able to do without any contact, TKE with 3 sec holds x 10 each with therapist hand behind  ankle with very minimal assist to get movement started, heel raises x 10 reps bilaterally, toe raises x 10 bilaterally with trace movement noted on R, hip abduction with yellow band x 10 reps, ball squeezes with 3 sec holds x 10 with increased weakness noted on R Standing in // bars: knee flexion x 10 reps on L and 3 reps on R with increased difficulty noted with this. Pt reported dizziness with standing and then required a seated recovery period  03/12/24- demonstrated seated exercises to pt for pt to begin doing at home - marching, knee extension and ankle DF (for R do in side lying without gravity)   PATIENT EDUCATION:  Education details: seated exercises (hip flexion, knee extension, ankle DF), in side lying work on R ankle DF without gravity, use walker instead of cane to decrease fall risk Person educated: Patient Education method: Medical Illustrator Education comprehension: verbalized understanding  HOME EXERCISE PROGRAM: Seated hip flexion, knee extension, L ankle DF In side lying work on R ankle DF  Access Code: [email or web address] URL: [email or web address] Date: 04/02/2024 Prepared by: Saddie Raw  Exercises - Seated Long Arc Quad  - 1 x daily - 7 x weekly - 1-3 sets - 10 reps - 3 sec hold - Seated March  - 1 x daily - 7 x weekly - 1-3 sets - 10 reps - no hold - Seated Hip Adduction Isometrics with Ball  - 1 x daily - 7 x weekly - 1-3 sets - 10 reps - no hold - Seated Hip Abduction with Resistance  - 1 x daily - 7 x weekly - 1-3 sets - 10 reps - 3 seconds hold - Seated Heel Toe Raises  - 1 x daily - 7 x weekly - 1-3 sets - 10 reps - no hold - Standing Gastroc Stretch at Counter  - 1 x daily - 7 x weekly - 1 sets - 3 reps - 20-30 seconds hold - Standing Hip Abduction with Counter Support  - 1 x daily - 7 x weekly - 1 sets - 5-10 reps - no hold  ASSESSMENT:  CLINICAL IMPRESSION: Pt not feeling as well today because she is taking her chemo currently. She has been  having increased difficulty eating due to pain in her abdomen when she eats. She plans to reach out to her oncologist. Re measured strength today and her strength has improved greatly since her eval. She now is able to actively move her R foot in to DF which she only had trace prior. Her hip flexion strength has also improved. She had some increased difficulty with 1 HHA while doing hip strength on L and standing on R and had to increased to 2 HHA  today.   OBJECTIVE IMPAIRMENTS: Abnormal gait, decreased activity tolerance, decreased balance, decreased coordination, decreased knowledge of condition, decreased knowledge of use of DME, decreased mobility, difficulty walking, decreased ROM, decreased strength, impaired sensation, postural dysfunction, and pain.   ACTIVITY LIMITATIONS: bending, standing, squatting, sleeping, stairs, transfers, bed mobility, bathing, dressing, and caring for others  PARTICIPATION LIMITATIONS: meal prep, cleaning, laundry, driving, shopping, and community activity  PERSONAL FACTORS: Fitness and Time since onset of injury/illness/exacerbation are also affecting patient's functional outcome.   REHAB POTENTIAL: Good  CLINICAL DECISION MAKING: Unstable/unpredictable  EVALUATION COMPLEXITY: High   GOALS: Goals reviewed with patient? Yes  SHORT TERM GOALS: Target date: 03/30/24  Pt will demonstrate 2+/5 R hip flexion strength to decrease fall risk. Baseline: Goal status: ONGOING  2.  Pt will obtain an AFO to help with foot drop to decrease fall risk. Baseline:  Goal status: ONGOING - deferred currently due to inability to tolerate anything touching her foot  3.  Pt will demonstrate 2-/5 R ankle DF strength to decrease fall risk.  Baseline:  Goal status: ONGOING - pt beginning to demonstrate some movement against gravity   LONG TERM GOALS: Target date: 04/09/24  Pt will report the numbness and tingling in her feet have improved by at least 40% to allow improved  function. Baseline:  Goal status: INITIAL  2.  Pt will demonstrate 3/5 R hip DF strength to decrease fall risk and increase independence with ambulation. Baseline:  Goal status: ONGOING  3.  Pt will demonstrate 3/5 R hip flexion strength to help decrease fall risk when ambulating. Baseline:  Goal status: ONGOING  4.  Pt will be independent in a home exercise program for continued stretching and strengthening.  Baseline:  Goal status: ONGOING   PLAN:  PT FREQUENCY: 2x/week  PT DURATION: 4 weeks  PLANNED INTERVENTIONS: 02835- PT Re-evaluation, 97750- Physical Performance Testing, 97110-Therapeutic exercises, 97530- Therapeutic activity, [id number]- Neuromuscular re-education, 97535- Self Care, 02859- Manual therapy, [phone]- Gait training, [phone]- Orthotic Initial, [phone]- Orthotic/Prosthetic subsequent, Patient/Family education, Balance training, Joint mobilization, Therapeutic exercises, Therapeutic activity, Neuromuscular re-education, Gait training, and Self Care  PLAN FOR NEXT SESSION: Cont gentle LE strengthening especially R ankle and R hip, standing activities and gait training as able, especially depending on when she had her last chemotherapy, AFO?   [facility] [place], PT 04/28/2024, 10:02 AM "

## 2024-04-28 NOTE — Progress Notes (Unsigned)
 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   -she started first line chemo FLOT on 05/09/22 -She understands that chemotherapy is palliative, to prolong her life.  We are unlikely going to cure her cancer. -PD-L1 0-1%, very limited benefit from PD-L1 immunotherapy, FO revealed FGFR2 amplification and fusion (+), FGFR inhibitors can be considered in future, no other targeted therapy available  -I changed her chemo from FLOT to FOLFOX on 06/20/2022 -She is not able to return to work due to the cancer and treatment related symptoms.  -I subsequently changed her treatment to maintenance Xeloda  in early August 2024, she is tolerating well overall  -her NGS Caris showed positive Claudin 18.2, she is a candidate for zolbetuximab.  -Repeated EGD on March 21, 2023 showed residual gastric cancer.  We discussed option of changing her chemotherapy back to FOLFOX and add zolbetuximab.  -PET 06/03/2023 showed stable disease (no hypermetabolic disease outside stomach) -Patient developed recurrent abdominal pain, similar to the symptoms she had when she was diagnosed.  She agreed to change treatment back to FOLFOX, and add Zolbetuximab. She started on 07/09/2023. She tolerated first cycle poorly and had prolonged recovery. Oxaliplatin  was stop after cycle 1 due to poor tolerance. She continued 5-fu/LV and zolbe every 2 weeks  -CT 10/10/2023 showed stable disease - Unfortunately PET scan on December 06, 2023 showed worsening peritoneal metastasis.  I  recommend change treatment to paclitaxel  and ramucirumab , she started on 12/17/23 -PET 03/02/2024 showed disease progression in stomack and nodes, but Signatera showed improved ctDNA. -chemo changed to [name]  on 03/30/2024 - Nausea and appetite decreased since starting [name] .  Oral rehydration and protein supplementation.  Rest as needed.  Plan for 3-week follow-up with labs and OV.  Strict return instructions given.  She may need IV fluids and/or potassium medication.   Neutropenia Likely chemotherapy induced.  Overall stable.  Will continue to monitor with every visit.  Adjust chemotherapy as indicated.  Anemia Mild and stable anemia.  No requirement for blood transfusion or iron infusion today.  Will continue to monitor  with every visit and treat deficiency as indicated.  Weight loss Patient with decreased appetite and weight loss of 9 pounds since last visit.  Not eating or drinking very much.  Labs do show hypokalemia without evidence of dehydration.  Recommend  aggressive oral hydration.  Trial of protein shakes for meal supplementation when unable to consume food.  Will treat nausea and constipation with medication as indicated.  Patient understands to call the cancer center if symptoms do not improve over the next 2 to 3 days.  Plan Labs reviewed. -Neutropenia, likely related to chemotherapy with [name] . - stable anemia. - Hypokalemia.  Recommend oral potassium as prescribed.  Electrolyte supplementation with Gatorade or liquid IV. Recommend protein shakes for calorie and protein supplementation.  Continue with Reglan  along with antinausea and promotility medications as needed. Plan to follow-up in 3 weeks with labs and OV. Advised her to call for persistent and uncontrolled symptoms.  May need IV fluids and potassium supplementation. .. The patient understands the plans discussed today and  is in agreement with them.  She knows to contact our office if she develops concerns prior to  her next appointment.  I provided 25 minutes of face-to-face time during this encounter and > 50% was spent counseling as documented under my assessment and plan.    Powell FORBES Lessen, NP  [facility] - A DEPT OF [name] [address] [place] [address] Dept: [phone] Dept Fax: [phone]   No orders of the defined types were placed in this encounter.     CHIEF COMPLAINT:  CC: Malignant neoplasm of body of stomach  Current Treatment: Third line [name]   INTERVAL HISTORY:  Oluwakemi is here today for repeat clinical assessment.  The patient last saw Dr. Lanny on 03/30/2024.  She started [name]  on that date.  She is taking [name]  and for 5 days on, 9 days off.  Chemotherapy changed to [name]  due to evidence of disease progression on PET scan dated 03/02/2024.  Repeat PET scan recommended for 3 months after start of [name] .  She reports significant nausea and decreased appetite since starting [name] .  She feels dehydrated.  She is fatigued and weak.  She does have some abdominal tenderness, described more like a burning in her stomach when she tries to eat or drink.  She denies chest pain, chest pressure, or shortness of breath. She denies headaches or visual disturbances.  She does report moderate constipation.  There is tenderness with  deep palpation of the abdomen.  She denies fevers or chills. She denies pain. Her appetite is poor. Her weight has decreased 9 pounds over last month.  I have reviewed the past medical history, past surgical history, social history and family history with the patient and they are unchanged from previous note.  ALLERGIES:  is allergic to aspirin, cyclobenzaprine, naproxen sodium, zithromax [azithromycin dihydrate], oxaliplatin , and dilaudid [hydromorphone].  MEDICATIONS:  Current Outpatient Medications  Medication Sig Dispense Refill   acetaminophen  (TYLENOL ) 500 MG tablet Take  2 tablets (1,000 mg total) by mouth every 8 (eight) hours as needed (pain). 30 tablet 1   amLODipine  (NORVASC ) 10 MG tablet Take 1 tablet (10 mg total) by mouth daily. 90 tablet 1   b complex vitamins capsule Take 1 capsule by mouth daily.     calcium -vitamin D (OSCAL WITH D) 500-5 MG-MCG tablet Take 2 tablets by mouth 2 (two) times daily.     famotidine  (PEPCID ) 20 MG tablet Take 1 tablet (20 mg total) by mouth 2 (two) times daily. 60 tablet 2   HYDROcodone -acetaminophen  (NORCO/VICODIN) 5-325 MG tablet Take 1 tablet by mouth every 12 (twelve) hours as needed for moderate pain (pain score 4-6). 60 tablet 0   lidocaine -prilocaine  (EMLA ) cream Apply 1 Application topically as needed. 30 g 1   metoCLOPramide  (REGLAN ) 10 MG tablet Take 1 tablet (10 mg total) by mouth every 8 (eight) hours as needed for nausea. 60 tablet 1   pantoprazole  (PROTONIX ) 40 MG tablet Take 1 tablet (40 mg total) by mouth daily. 30 tablet 2   potassium chloride  (KLOR-CON  M) 10 MEQ tablet Take 1 tablet (10 mEq total) by mouth 3 (three) times daily. 90 tablet 1   prochlorperazine  (COMPAZINE ) 10 MG tablet Take 1 tablet (10 mg total) by mouth every 6 (six) hours as needed for nausea or vomiting. 30 tablet 2   promethazine  (PHENERGAN ) 25 MG tablet Take 2 tablets (50 mg total) by mouth 2 (two) times daily as  needed for nausea or vomiting. 90 tablet 1   scopolamine  (TRANSDERM-SCOP) 1 MG/3DAYS Place 1 patch (1.5 mg total) onto the skin every 3 (three) days. 10 patch 1   sucralfate  (CARAFATE ) 1 g tablet Take 1 tablet (1 g total) by mouth 4 (four) times daily. 120 tablet 1   trifluridine -tipiracil  ([name] ) 20-8.19 MG tablet Take 3 tablets (60 mg of trifluridine  total) by mouth 2 (two) times daily after a meal. Take within 1 hr after AM & PM meals on days 1-5 every 14 days. 60 tablet 1   zinc gluconate 50 MG tablet Take 50 mg by mouth daily.     No current facility-administered medications for this visit.    HISTORY OF PRESENT ILLNESS:    Oncology History Overview Note   Cancer Staging  Gastric cancer [facility]) Staging form: Stomach, AJCC 8th Edition - Clinical stage from 04/19/2022: Stage IVB (cT2, cN0, pM1) - Signed by [name], MD on 05/08/2022 Total positive nodes: 0     Gastric cancer (HCC)  03/30/2022 Procedure   EGD:  Impression:  - Normal esophagus. - A few gastric polyps. Biopsied. - Gastritis. Biopsied. - Non-bleeding gastric ulcer with no stigmata of bleeding. Biopsied. - Normal examined duodenum. Biopsied.  Findings: Diffuse moderate inflammation characterized by congestion (edema), friability and granularity was found in the cardia, in the gastric fundus and in the gastric body. There were associated erosions in multiple places. Biopsies were taken from the antrum, body, and fundus with a cold forceps for histology. Estimated blood loss was minimal.  One non-bleeding cratered gastric ulcer with no stigmata of bleeding was found on the greater curvature of the stomach. The lesion was 6 mm in largest dimension. The mucosa around the ulcer was heaped and led to some deformity in the antrum. Biopsies were taken with a cold forceps for histology. Estimated blood loss was minimal.    03/30/2022 Pathology Results   Patient: Stumpo, Takima P  Accession: [id number]  Diagnosis 1. Surgical [P], duodenal - BENIGN SMALL BOWEL MUCOSA WITH NO SIGNIFICANT PATHOLOGIC CHANGES 2. Surgical [P], gastric antrum - GASTRIC ANTRAL MUCOSA WITH FEATURES OF REACTIVE GASTROPATHY - NEGATIVE FOR H. PYLORI ON H&E STAIN - NEGATIVE FOR INTESTINAL METAPLASIA OR MALIGNANCY 3. Surgical [P], gastric body - GASTRIC OXYNTIC MUCOSA WITH REACTIVE/REPARATIVE CHANGES - NEGATIVE FOR H. PYLORI ON H&E STAIN - NEGATIVE FOR INTESTINAL METAPLASIA, DYSPLASIA OR MALIGNANCY 4. Surgical [P], greater curve ulceration - ADENOCARCINOMA WITH SIGNET RING CELL FEATURES (SEE NOTE) 5. Surgical [P], gastric polyps - ADENOCARCINOMA WITH SIGNET RING CELL  FEATURES (SEE NOTE) 6. Surgical [P], fundus (gastric) - ADENOCARCINOMA WITH SIGNET RING CELL FEATURES (SEE NOTE) 7. Surgical [P], colon, ascending, polyp (1) - TUBULAR ADENOMA. - NO HIGH GRADE DYSPLASIA OR MALIGNANCY. 8. Surgical [P], colon, transverse, polyp (1) - TUBULAR ADENOMA. - NO HIGH GRADE DYSPLASIA OR MALIGNANCY.    04/13/2022 Initial Diagnosis   Gastric cancer (HCC)   04/19/2022 Cancer Staging   Staging form: Stomach, AJCC 8th Edition - Clinical stage from 04/19/2022: Stage IVB (cT2, cN0, pM1) - Signed by [name], MD on 05/08/2022 Total positive nodes: 0   05/05/2022 Genetic Testing   Negative genetic testing on the Multi-cancer gene panel + RNA.  FH c.259C>T VUS identified.  The report date is May 05, 2022.  The Multi-Cancer + RNA Panel offered by [facility] includes sequencing and/or deletion/duplication analysis of the following 70 genes:  AIP*, ALK, APC*, ATM*, AXIN2*, BAP1*, BARD1*, BLM*, BMPR1A*, BRCA1*, BRCA2*, BRIP1*, CDC73*, CDH1*, CDK4, CDKN1B*, CDKN2A,  CHEK2*, CTNNA1*, DICER1*, EPCAM (del/dup only), EGFR, FH*, FLCN*, GREM1 (promoter dup only), HOXB13, KIT, LZTR1, MAX*, MBD4, MEN1*, MET, MITF, MLH1*, MSH2*, MSH3*, MSH6*, MUTYH*, NF1*, NF2*, NTHL1*, PALB2*, PDGFRA, PMS2*, POLD1*, POLE*, POT1*, PRKAR1A*, PTCH1*, PTEN*, RAD51C*, RAD51D*, RB1*, RET, SDHA* (sequencing only), SDHAF2*, SDHB*, SDHC*, SDHD*, SMAD4*, SMARCA4*, SMARCB1*, SMARCE1*, STK11*, SUFU*, TMEM127*, TP53*, TSC1*, TSC2*, VHL*. RNA analysis is performed for * genes.    05/09/2022 - 06/07/2022 Chemotherapy   Patient is on Treatment Plan : GASTROESOPHAGEAL FLOT q14d X 4 cycles      Miscellaneous   Foundation One  Biomarker Findings Microsatellite status- Cannot be determined Tumor Mutational Burden- Cannot be determined  Genomic Findings  FGFR2 amplification,FGFR2-TACC2 fusion,  Rearrangement intron 17 ARAF amplification CCND3 amplification TP53 V247fs*74     05/30/2022 Imaging    IMPRESSION: 1.  Mild hypermetabolism corresponding to a dominant left upper quadrant mass and smaller perigastric nodules or nodes. 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 Recommend attention to the dominant left upper quadrant soft tissue mass on follow-up exams to exclude unlikely recurrent lymphoma. 2. No gastric hypermetabolism and no typical findings of metastatic disease.   06/20/2022 - 11/16/2022 Chemotherapy   Patient is on Treatment Plan : GASTRIC FOLFOX q14d x 12 cycles     08/27/2022 Imaging    IMPRESSION: No focal gastric mass on CT.   No findings suspicious for recurrent or metastatic disease.   Stable left upper abdominal soft tissue lesion and small lymph nodes, chronic, favoring treated lymphoma.   11/27/2022 Imaging    IMPRESSION: 1. Questionable thickening of the distal esophagus/GE junction and gastric antrum, consider further evaluation with endoscopy. 2. Chronically stable left upper quadrant nodularity and prominent lymph nodes again favored treated lymphoma. Continued attention on follow-up imaging suggested. 3. No convincing evidence of metastatic disease in the chest, abdomen or pelvis. 4. Questionable asymmetric wall thickening of the rectum, consider further evaluation with colonoscopy. 5. Mild wall thickening of a nondistended urinary bladder, correlate with urinalysis to exclude cystitis. 6. Hepatic steatosis.   07/10/2023 - 11/27/2023 Chemotherapy   Patient is on Treatment Plan : GASTROESOPHAGEAL Zolbetuximab (800/400) + FOLFOX D1,15,29 q42d x 4 cycles / Zolbetuximab (400) + 5FU + Leucovorin  D1,15,29 q42d     12/17/2023 - 03/11/2024 Chemotherapy   Patient is on Treatment Plan : GASTRIC/GE JUNCTION PACLitaxel  D1,8,15 + Ramucirumab  D1,15 q28d         REVIEW OF SYSTEMS:   Constitutional: Denies fevers, chills or abnormal weight loss. Fatigue. Decreased appetite.  Eyes: Denies blurriness of vision Ears, nose, mouth, throat,  and face: Denies mucositis or sore throat Respiratory: Denies cough, dyspnea or wheezes Cardiovascular: Denies palpitation, chest discomfort or lower extremity swelling Gastrointestinal:  moderate nausea without vomiting. Having intermittent constipation.  Skin: Denies abnormal skin rashes Lymphatics: Denies new lymphadenopathy or easy bruising Neurological:Denies numbness, tingling or new weaknesses Behavioral/Psych: Mood is stable, no new changes  All other systems were reviewed with the patient and are negative.   VITALS:   Today's Vitals   04/29/24 1100 04/29/24 1104  BP: (!) 119/92 137/89  Pulse: 99   Resp: 18   Temp: (!) 97.2 F (36.2 C)   TempSrc: Temporal   SpO2: 100%   Weight: 140 lb 3.2 oz (63.6 kg)   Height: 5' 8 (1.727 m)   PainSc: 0-No pain    Body mass index is 21.32 kg/m.   Wt Readings from Last 3 Encounters:  04/29/24 140 lb 3.2 oz (63.6 kg)  03/30/24 149 lb 12.8 oz (67.9 kg)  03/17/24 151 lb (68.5 kg)    Body mass index is 21.32 kg/m.  Performance status (ECOG): 2 - Symptomatic, <50% confined to bed  PHYSICAL EXAM:   GENERAL:alert, no distress and comfortable. Appears weak and uncomfortable.  SKIN: skin color, texture, turgor are normal, no rashes or significant lesions EYES: normal, Conjunctiva are pink and non-injected, sclera clear OROPHARYNX:no exudate, no erythema and lips, buccal mucosa, and tongue normal  NECK: supple, thyroid  normal size, non-tender, without nodularity LYMPH:  no palpable lymphadenopathy in the cervical, axillary or inguinal LUNGS: clear to auscultation and percussion with normal breathing effort HEART: regular rate & rhythm and no murmurs and no lower extremity edema ABDOMEN:abdomen soft. There is moderate tenderness with deep palpation of the abdomen.  Musculoskeletal:no cyanosis of digits and no clubbing  NEURO: alert & oriented x 3 with fluent speech, no focal motor/sensory deficits  LABORATORY DATA:  I have reviewed  the data as listed    Component Value Date/Time   NA 139 04/29/2024 0956   NA 137 12/20/2020 0957   K 3.3 (L) 04/29/2024 0956   CL 102 04/29/2024 0956   CO2 27 04/29/2024 0956   GLUCOSE 99 04/29/2024 0956   BUN 9 04/29/2024 0956   BUN 11 12/20/2020 0957   CREATININE 0.59 04/29/2024 0956   CREATININE 0.49 03/30/2024 1238   CALCIUM  9.4 04/29/2024 0956   PROT 6.7 04/29/2024 0956   PROT 7.3 12/20/2020 0957   ALBUMIN 4.1 04/29/2024 0956   ALBUMIN 4.7 12/20/2020 0957   AST 22 04/29/2024 0956   AST 24 03/30/2024 1238   ALT 6 04/29/2024 0956   ALT 9 03/30/2024 1238   ALKPHOS 76 04/29/2024 0956   BILITOT 0.6 04/29/2024 0956   BILITOT 0.3 03/30/2024 1238   GFRNONAA >60 04/29/2024 0956   GFRNONAA >60 03/30/2024 1238   GFRAA 106 03/31/2020 1036    Lab Results  Component Value Date   WBC 2.1 (L) 04/29/2024   NEUTROABS 1.0 (L) 04/29/2024   HGB 11.8 (L) 04/29/2024   HCT 33.9 (L) 04/29/2024   MCV 80.1 04/29/2024   PLT 254 04/29/2024   "

## 2024-04-29 ENCOUNTER — Inpatient Hospital Stay

## 2024-04-29 ENCOUNTER — Inpatient Hospital Stay (HOSPITAL_BASED_OUTPATIENT_CLINIC_OR_DEPARTMENT_OTHER): Admitting: Nurse Practitioner

## 2024-04-29 VITALS — BP 137/89 | HR 99 | Temp 97.2°F | Resp 18 | Ht 68.0 in | Wt 140.2 lb

## 2024-04-29 DIAGNOSIS — C162 Malignant neoplasm of body of stomach: Secondary | ICD-10-CM

## 2024-04-29 LAB — CBC WITH DIFFERENTIAL (CANCER CENTER ONLY)
Abs Immature Granulocytes: 0 K/uL (ref 0.00–0.07)
Basophils Absolute: 0 K/uL (ref 0.0–0.1)
Basophils Relative: 1 %
Eosinophils Absolute: 0 K/uL (ref 0.0–0.5)
Eosinophils Relative: 2 %
HCT: 33.9 % — ABNORMAL LOW (ref 36.0–46.0)
Hemoglobin: 11.8 g/dL — ABNORMAL LOW (ref 12.0–15.0)
Immature Granulocytes: 0 %
Lymphocytes Relative: 37 %
Lymphs Abs: 0.8 K/uL (ref 0.7–4.0)
MCH: 27.9 pg (ref 26.0–34.0)
MCHC: 34.8 g/dL (ref 30.0–36.0)
MCV: 80.1 fL (ref 80.0–100.0)
Monocytes Absolute: 0.3 K/uL (ref 0.1–1.0)
Monocytes Relative: 16 %
Neutro Abs: 1 K/uL — ABNORMAL LOW (ref 1.7–7.7)
Neutrophils Relative %: 44 %
Platelet Count: 254 K/uL (ref 150–400)
RBC: 4.23 MIL/uL (ref 3.87–5.11)
RDW: 17.5 % — ABNORMAL HIGH (ref 11.5–15.5)
WBC Count: 2.1 K/uL — ABNORMAL LOW (ref 4.0–10.5)
nRBC: 0 % (ref 0.0–0.2)

## 2024-04-29 LAB — COMPREHENSIVE METABOLIC PANEL WITH GFR
ALT: 6 U/L (ref 0–44)
AST: 22 U/L (ref 15–41)
Albumin: 4.1 g/dL (ref 3.5–5.0)
Alkaline Phosphatase: 76 U/L (ref 38–126)
Anion gap: 10 (ref 5–15)
BUN: 9 mg/dL (ref 6–20)
CO2: 27 mmol/L (ref 22–32)
Calcium: 9.4 mg/dL (ref 8.9–10.3)
Chloride: 102 mmol/L (ref 98–111)
Creatinine, Ser: 0.59 mg/dL (ref 0.44–1.00)
GFR, Estimated: >60 mL/min
Glucose, Bld: 99 mg/dL (ref 70–99)
Potassium: 3.3 mmol/L — ABNORMAL LOW (ref 3.5–5.1)
Sodium: 139 mmol/L (ref 135–145)
Total Bilirubin: 0.6 mg/dL (ref 0.0–1.2)
Total Protein: 6.7 g/dL (ref 6.5–8.1)

## 2024-04-30 ENCOUNTER — Encounter: Payer: Self-pay | Admitting: Hematology

## 2024-04-30 ENCOUNTER — Encounter: Payer: Self-pay | Admitting: Nurse Practitioner

## 2024-05-01 ENCOUNTER — Encounter: Payer: Self-pay | Admitting: Physical Therapy

## 2024-05-01 ENCOUNTER — Ambulatory Visit: Attending: Hematology | Admitting: Physical Therapy

## 2024-05-01 DIAGNOSIS — C162 Malignant neoplasm of body of stomach: Secondary | ICD-10-CM | POA: Insufficient documentation

## 2024-05-01 DIAGNOSIS — R2689 Other abnormalities of gait and mobility: Secondary | ICD-10-CM | POA: Insufficient documentation

## 2024-05-01 DIAGNOSIS — R208 Other disturbances of skin sensation: Secondary | ICD-10-CM | POA: Diagnosis present

## 2024-05-01 DIAGNOSIS — M6281 Muscle weakness (generalized): Secondary | ICD-10-CM | POA: Insufficient documentation

## 2024-05-01 NOTE — Therapy (Signed)
 " OUTPATIENT PHYSICAL THERAPY  LOWER EXTREMITY ONCOLOGY TREATMENT  Patient Name: Sheryl [name] MRN: [id number] DOB:[birth year], 59 y.o., female Today's Date: 05/01/2024  END OF SESSION:  PT End of Session - 05/01/24 1000     Visit Number 9    Number of Visits 14    Date for Recertification  05/12/24    Authorization Type [employer] authorized 12 visits 03/12/24-05/11/24 [id number]    Authorization - Visit Number 9    Authorization - Number of Visits 12    PT Start Time 0900    PT Stop Time 0958    PT Time Calculation (min) 58 min    Activity Tolerance Patient tolerated treatment well    Behavior During Therapy [facility] for tasks assessed/performed                Past Medical History:  Diagnosis Date   Allergy    Blood transfusion without reported diagnosis    had transfusion with hysterectomy   Cataract    Colon polyps 2012   Diabetes (HCC) 03/13/2021   Diabetes (HCC) 05/21/2019   Family history of breast cancer    Family history of pancreatic cancer    Family history of stomach cancer    Fibroid    gastric ca 03/2022   GERD (gastroesophageal reflux disease)    H/O blood clots    History of hysterectomy    fibroids and heavy cycles   Hypertension    Stomach cancer (HCC) 04/05/22   Biopsy results from Dr Wilene   Past Surgical History:  Procedure Laterality Date   ABDOMINAL HYSTERECTOMY     BIOPSY  04/19/2022   Procedure: BIOPSY;  Surgeon: Wilhelmenia Aloha Raddle., MD;  Location: [facility];  Service: Gastroenterology;;   BIOPSY  03/21/2023   Procedure: BIOPSY;  Surgeon: Wilhelmenia Aloha Raddle., MD;  Location: [facility];  Service: Gastroenterology;;   COLONOSCOPY     ESOPHAGOGASTRODUODENOSCOPY (EGD) WITH PROPOFOL  N/A 04/19/2022   Procedure: ESOPHAGOGASTRODUODENOSCOPY (EGD) WITH PROPOFOL ;  Surgeon: Wilhelmenia Aloha Raddle., MD;  Location: [facility];  Service: Gastroenterology;  Laterality: N/A;   ESOPHAGOGASTRODUODENOSCOPY (EGD) WITH PROPOFOL  N/A  03/21/2023   Procedure: ESOPHAGOGASTRODUODENOSCOPY (EGD) WITH PROPOFOL ;  Surgeon: Wilhelmenia Aloha Raddle., MD;  Location: [facility];  Service: Gastroenterology;  Laterality: N/A;   EUS N/A 04/19/2022   Procedure: UPPER ENDOSCOPIC ULTRASOUND (EUS) RADIAL;  Surgeon: Wilhelmenia Aloha Raddle., MD;  Location: [facility];  Service: Gastroenterology;  Laterality: N/A;   EXCISION OF SKIN TAG  05/03/2022   Procedure: EXCISION OF CHEST WALL SKIN LESION;  Surgeon: Dasie Leonor CROME, MD;  Location: [facility];  Service: General;;   EYE SURGERY  1997   Removed cataracts   LAPAROSCOPY N/A 05/03/2022   Procedure: LAPAROSCOPY DIAGNOSTIC WITH PERITONEAL WASHINGS;  Surgeon: Dasie Leonor CROME, MD;  Location: [facility] OR;  Service: General;  Laterality: N/A;   POLYPECTOMY  04/19/2022   Procedure: POLYPECTOMY;  Surgeon: Wilhelmenia Aloha Raddle., MD;  Location: [facility];  Service: Gastroenterology;;   PORTACATH PLACEMENT N/A 05/03/2022   Procedure: INSERTION PORT-A-CATH WITH ULTRASOUND GUIDANCE;  Surgeon: Dasie Leonor CROME, MD;  Location: [facility];  Service: General;  Laterality: N/A;   UPPER GASTROINTESTINAL ENDOSCOPY     Patient Active Problem List   Diagnosis Date Noted   Type 2 diabetes mellitus with diabetic neuropathy, without long-term current use of insulin  (HCC) 03/17/2024   Encounter for general adult medical examination with abnormal findings 03/17/2024   Immunization due 03/17/2024   Need for immunization against influenza 03/13/2023  Gastroesophageal reflux disease with esophagitis without hemorrhage 03/13/2023   Port-A-Cath in place 05/23/2022   Genetic testing 05/21/2022   Gastric cancer (HCC) 04/13/2022   Hypertension 01/24/2022   Type II diabetes mellitus with manifestations (HCC) 01/24/2022   Need for vaccination 01/24/2022   Gastroesophageal reflux disease without esophagitis 01/23/2022   Hyperlipidemia LDL goal <100 01/23/2022   Diuretic-induced hypokalemia 01/23/2022    PCP: Debby Molt,  MD  REFERRING PROVIDER: [name], MD  REFERRING DIAG: C16.2 (ICD-10-CM) - Malignant neoplasm of body of stomach (HCC)  THERAPY DIAG:  Other disturbances of skin sensation  Muscle weakness (generalized)  Other abnormalities of gait and mobility  Malignant neoplasm of body of stomach (HCC)  ONSET DATE: 05/09/2022  Rationale for Evaluation and Treatment: Rehabilitation  SUBJECTIVE:                                                                                                                                                                                           SUBJECTIVE STATEMENT: I am still having trouble keeping things down. I am still throwing up. I have a burning pain in my stomach.   PERTINENT HISTORY: Metastatic gastric cancer with peritoneal metastasis Disease progression on recent PET scan with increased activity in the stomach and peritoneal nodule. Previous treatments included chemotherapy and antibody therapy with limited response. Current treatment with paclitaxel  causing significant neuropathy.Will be switching to oral chemo on Dec 1 in hopes of decreasing neuropathy  PAIN:  Are you having pain? No NPRS scale: Just numbness/tingling bilateral feet 8/10 Pain location: bilateral feet Pain orientation: Bilateral  PAIN TYPE: tingling and numbness Pain description: constant  Aggravating factors: touching the foot (pain increases to 20), walking  Relieving factors: nothing  PRECAUTIONS: Pt reports when she stands for a long time she gets light headed 5-10 min, light headed with bending forward  RED FLAGS: None   WEIGHT BEARING RESTRICTIONS: No  FALLS:  Has patient fallen in last 6 months? Yes. Number of falls 1 (end of Sept 2025) - reports foot turned sideway and she fell on the floor    LIVING ENVIRONMENT: Lives with: lives with their family Lives in: House/apartment Stairs: No;  Has following equipment at home: Single point cane and Walker - 2 wheeled,  unable to drive  OCCUPATION: not working/ disability  LEISURE: does not exercise  PRIOR LEVEL OF FUNCTION: Independent with household mobility with device and Independent with community mobility with device uses wall for balance in the home, makes small meals but would be unable to carry groceries  PATIENT GOALS: strengthen the feet to be able to drive again   OBJECTIVE: Note:  Objective measures were completed at Evaluation unless otherwise noted.  COGNITION: Overall cognitive status: Within functional limits for tasks assessed   SENSATION: Unable to formally assess due to extreme sensitivity in bilateral feet  POSTURE: forward head, rounded shoulders  LOWER EXTREMITY STRENGTH:  MMT Right eval RIGHT 04/28/24  Hip flexion 2-/5 3/5  Hip extension    Hip abduction 2-/5   Hip adduction    Hip internal rotation    Hip external rotation    Knee flexion 3+/5 3+/5  Knee extension 3+/5 4/5  Ankle dorsiflexion 1/5 2+/5  Ankle plantarflexion    Ankle inversion    Ankle eversion    Great toe extension     (Blank rows = not tested)  MMT LEFT eval LEFT 04/28/24  Hip flexion 3/5 3+/5  Hip extension    Hip abduction 3+/5   Hip adduction    Hip internal rotation    Hip external rotation    Knee flexion 4/5 4/5  Knee extension 3+/5 5/5  Ankle dorsiflexion 2+/5 3/5  Ankle plantarflexion    Ankle inversion    Ankle eversion    Great toe extension      (Blank rows = not tested)  FUNCTIONAL TESTS:  Lower Extremity Functional Scale:  Lower Extremity Functional Score: 11 / 80 = 13.8 %  An LEFS score is interpreted on a scale of 0 to 80, where a higher score indicates better lower extremity function. Scores between 61 and 80 typically represent minimal to normal limitation, scores from 41 to 60 suggest mild to moderate limitation, and scores below 40 indicate moderate to severe limitation. A score of 0 means a person is unable to perform most activities, while a score of 80  means there is no difficulty.    30 sec sit to stand: Unable   GAIT: Distance walked: 10 ft Assistive device utilized: Single point cane Level of assistance: SBA Comments: slow gait speed, shortened step length, decreased foot clearance, decreased hip flexion on R                                                                                                                            TREATMENT DATE:  05/01/24: Therapeutic Exercises and Acitivities Seated in chair: Started with both feet on plate 2 x 60 sec to improve neuropathy set to stretch with no shoe on bilateral feet 30Hz , then 1x 60 sec on low, 30 Hz in a long sit hamstring stretch position - 1 leg at a time with 60 sec hold Seated on edge of mat:  Seated on red disc: Alt LAQ with 2 lb ankle weights x 10, 5 sec holds with core engaged but seated on mat; seated on red disc: alt march x 10 each with 2 lb ankle weights; VC's for core engaged for stability throughout , hip abduction with green loop x 10 reps with 5 sec holds, ball squeezes with 5 sec holds x 10 Standing in // bars  for following: Standing on blue foam oval: hip SLR into flex, abd, and ext x 10 reps with VC's for correct technique; pt with slow, controlled motions with no seated recovery periods today, used one HHA when standing on L and 2 HHA when standing on R Standing on purple foam: marching x 10 reps bilaterally then mini squats with occasional HHA with pt returning therapist demo NuStep: Level 4, - no UEs so pt can focus on legs, seat at 10 x 10 min -  503 steps  04/28/24: Reassessed bilateral LE strength Therapeutic Exercises and Acitivities Seated in chair: Started with both feet on plate 2 x 60 sec to improve neuropathy set to stretch with no shoe on bilateral feet 30Hz , then 1x 60 sec on low, 30 Hz in a long sit hamstring stretch position - 1 leg at a time with 60 sec hold Seated rocker board up and down x 30 reps Seated on edge of mat:  Seated on red disc: Alt  LAQ with 1 lb ankle weights x 10, 5 sec holds with core engaged but seated on mat; seated on red disc: alt march x 10 each; VC's for core engaged for stability throughout , hip abduction with red loop x 10 reps with 5 sec holds, ball squeezes with 5 sec holds x 10 Feet on rocker board x 10 reps in to DF and PF Standing in // bars for following: Standing on blue foam oval: hip SLR into flex, abd, and ext x 10 reps with VC's for correct technique; pt with slow, controlled motions with no seated recovery periods today, used one HHA when standing on L and 2 HHA when standing on R NuStep: Level 4, - no UEs so pt can focus on legs, seat at 9 x 10 min -  541 steps  04/16/24: Therapeutic Exercises and Acitivities Seated in chair: Started with both feet on plate 2 x 60 sec to improve neuropathy set to stretch with no shoe on bilateral feet 30Hz , then 1x 60 sec on low, 30 Hz in a long sit hamstring stretch position - 1 leg at a time.  Seated rocker board up and down x 30 reps Seated on edge of mat:  Seated on red disc: Alt LAQ with 1 lb ankle weights x 10, 5 sec holds with core engaged but seated on mat; seated on red disc: alt march x 10 each; VC's for core engaged for stability throughout , hip abduction with red loop x 10 reps with 5 sec holds, ball squeezes with 5 sec holds x 10 Feet on rocker board x 10 reps in to DF and PF Standing in // bars for following: Standing on blue foam oval: hip SLR into flex, abd, and ext x 10 reps with VC's for correct technique; pt with slow, controlled motions with no seated recovery periods today NuStep: Level 4, - no UEs so pt can focus on legs, seat at 9 x 8 min -  415 steps Neuro Re education: In // bars marching on purple foam with no HHA x 7 reps 04/14/24: Therapeutic Exercises and Acitivities Seated in chair: Started with both feet on plate 2 x 30 sec to improve neuropathy set to stretch with no shoe on bilateral feet 30Hz , then 1x 60 sec on low, 30 Hz in a long  sit hamstring stretch position - 1 leg at a time.  Seated rocker board up and down x 30 Seated on edge of mat:  Seated on red disc: Alt  LAQ x 10, 5 sec holds with core engaged but seated on mat; seated on red disc: alt march x 10 each; VC's for core engaged for stability throughout  Hip abduction with yellow loop x 10 reps with 3 sec holds, ball squeezes with 3 sec holds x 15 Feet on rocker board x 10 reps in to DF and PF Standing in // bars for following: Standing hip SLR into flex, abd, and ext x 10 reps with VC's for correct technique; pt with slow, controlled motions with no seated recovery periods today NuStep: Level 4, - no UEs so pt can focus on legs, seat at 9 x 10 min -  537 steps Neuro Re education: In // bars standing on purple foam with no HHA x , then with 1 HHA marching x 10 on purple foam, then with no HHA but CGA shifting weight from L to R  04/09/24: Therapeutic Exercises and Acitivities Seated in chair: Started with both feet on plate 2 x 30 sec to improve neuropathy set to stretch with no shoe on R foot, 30Hz , then 1x 60 sec on low, with both feet on vibration plate while performing ankle DF/PF AROM with each position then 60 sec x in a long sit hamstring stretch position - 1 leg at a time. Conts with reports of Lt great toe pain when vibration on Seated rocker board up and down x 15 Seated on edge of mat:  Alt LAQ x 10, 5 sec holds with core engaged but seated on mat- pt reported mild stomach irritation; alt march x 10 each (alternated LAQ with the marching); VC's for core engaged for stability throughout with some soreness noted in core Hip abduction with yellow loop x 10 reps with 3 sec holds, ball squeezes with 3 sec holds x 15 Feet on rocker board x 10 reps in to DF and PF Standing in // bars for following: Standing hip SLR into flex, and ext x 10 reps with VC's for correct technique; pt with slow, controlled motions NuStep: Level 3, - no UEs so pt can focus on legs,  seat at 10 x 7 min - 228 steps Self Care Educated pt on what is normal to feel after therapy (soreness) and what is not (sharp pain), how core soreness is normal and to be expected but need to pace to avoid sharp pain but could still be possibly related to new chemo  04/07/24: Therapeutic Exercises and Acitivities Seated in chair: Started with both feet on plate 2 x 30 sec to improve neuropathy set to stretch, 30Hz , 60 sec on low, then removed Rt shoe for 1 foot at a time on vibration plate . Ankle DF/PF AROM with each position. 1 in a long sit hamstring stretch position and one in a foot flat position. Conts with reports of Lt great toe pain when vibration on Seated rocker board up and down x 15 Seated on pink disc for following:  Alt LAQ 2 x 5, 5 sec holds; alt march x 7 each (alternated LAQ with the marching); VC's for core engaged for stability throughout Hip abduction with yellow loop x 10 reps, ball squeezes with 3 sec holds x 15 Standing in // bars for following: Calf stretch standing on incline at end of bars 2 x 20 each leg Standing 3 way hip SLR into flex, abd and ext with VC's for correct technique; pt with slow, controlled motions, pt reports RPE 4-5 after standing activities seated rest break after NuStep: Level  3, UE 9 /LE 10 x 6 min and 126 steps; first x 3 mins with arms/legs then last x 3 mins with UE's as pt reports she could tell her arms were working more than her legs, seated rest after Self Care During rest breaks spent time educating pt about importance of standing erect as much as able and not letting herself lean forward with her walker. Also answered her questions about how the exercises will help improve her balance and strength.   04/02/24 Seated in chair: 1 foot at a time on vibration plate to improve neuropathy set to relax, 30Hz , 60 sec on low. Ankle DF/PF AROM with each position. 1 in a long sit hamstring stretch position and one in a foot flat position.  Pt reports  increased pain in L big toe and complete numbness in R foot Seated rocker board up and down x 5 Foot slide with sock on into toe point x 5  Seated edge of mat: alternating marches x 10 , LAQ with 3 sec holds x 10 each, without shoe to decrease weight,  hip abduction with yellow band x 10 reps, ball squeezes with 3 sec holds x 10 Standing in // bars: calf stretch 2x20 Rt only. Bil hip abduction x 5 with pt able to do the Rt but slow and with effort.   Updated HEP and gave yellow band   03/31/24: Adjusted pt's walker to be appropriate for her height Seated in chair: 1 foot at a time on vibration plate to improve neuropathy set to relax, 30Hz , 60 sec on low. Pt reports increased pain in L big toe and complete numbness in R foot Seated edge of mat: alternating marches x 10 with therapist providing contact to RLE to help with beginning the movement - the last 2 reps pt was able to do without any contact, TKE with 3 sec holds x 10 each with therapist hand behind ankle with very minimal assist to get movement started, heel raises x 10 reps bilaterally, toe raises x 10 bilaterally with trace movement noted on R, hip abduction with yellow band x 10 reps, ball squeezes with 3 sec holds x 10 with increased weakness noted on R Standing in // bars: knee flexion x 10 reps on L and 3 reps on R with increased difficulty noted with this. Pt reported dizziness with standing and then required a seated recovery period  03/12/24- demonstrated seated exercises to pt for pt to begin doing at home - marching, knee extension and ankle DF (for R do in side lying without gravity)   PATIENT EDUCATION:  Education details: seated exercises (hip flexion, knee extension, ankle DF), in side lying work on R ankle DF without gravity, use walker instead of cane to decrease fall risk Person educated: Patient Education method: Medical Illustrator Education comprehension: verbalized understanding  HOME EXERCISE  PROGRAM: Seated hip flexion, knee extension, L ankle DF In side lying work on R ankle DF  Access Code: [email or web address] URL: [email or web address] Date: 04/02/2024 Prepared by: Saddie Raw  Exercises - Seated Long Arc Quad  - 1 x daily - 7 x weekly - 1-3 sets - 10 reps - 3 sec hold - Seated March  - 1 x daily - 7 x weekly - 1-3 sets - 10 reps - no hold - Seated Hip Adduction Isometrics with Ball  - 1 x daily - 7 x weekly - 1-3 sets - 10 reps - no hold - Seated Hip Abduction with Resistance  -  1 x daily - 7 x weekly - 1-3 sets - 10 reps - 3 seconds hold - Seated Heel Toe Raises  - 1 x daily - 7 x weekly - 1-3 sets - 10 reps - no hold - Standing Gastroc Stretch at Counter  - 1 x daily - 7 x weekly - 1 sets - 3 reps - 20-30 seconds hold - Standing Hip Abduction with Counter Support  - 1 x daily - 7 x weekly - 1 sets - 5-10 reps - no hold  ASSESSMENT:  CLINICAL IMPRESSION: Pt still unable to eat due to burning pain and vomiting. Increased ankle weights today for seated exercises from 1 to 2 lbs with pt reporting a challenge but able to complete a set of 10 reps. Also increased resistance of hip abduction in seated to green band. Pt demonstrating increased strength in LEs. Added mini squats on purple foam today which pt found challenging. She requires cues for form.   OBJECTIVE IMPAIRMENTS: Abnormal gait, decreased activity tolerance, decreased balance, decreased coordination, decreased knowledge of condition, decreased knowledge of use of DME, decreased mobility, difficulty walking, decreased ROM, decreased strength, impaired sensation, postural dysfunction, and pain.   ACTIVITY LIMITATIONS: bending, standing, squatting, sleeping, stairs, transfers, bed mobility, bathing, dressing, and caring for others  PARTICIPATION LIMITATIONS: meal prep, cleaning, laundry, driving, shopping, and community activity  PERSONAL FACTORS: Fitness and Time since onset of injury/illness/exacerbation are also  affecting patient's functional outcome.   REHAB POTENTIAL: Good  CLINICAL DECISION MAKING: Unstable/unpredictable  EVALUATION COMPLEXITY: High   GOALS: Goals reviewed with patient? Yes  SHORT TERM GOALS: Target date: 03/30/24  Pt will demonstrate 2+/5 R hip flexion strength to decrease fall risk. Baseline: Goal status: ONGOING  2.  Pt will obtain an AFO to help with foot drop to decrease fall risk. Baseline:  Goal status: ONGOING - deferred currently due to inability to tolerate anything touching her foot  3.  Pt will demonstrate 2-/5 R ankle DF strength to decrease fall risk.  Baseline:  Goal status: ONGOING - pt beginning to demonstrate some movement against gravity   LONG TERM GOALS: Target date: 04/09/24  Pt will report the numbness and tingling in her feet have improved by at least 40% to allow improved function. Baseline:  Goal status: INITIAL  2.  Pt will demonstrate 3/5 R hip DF strength to decrease fall risk and increase independence with ambulation. Baseline:  Goal status: ONGOING  3.  Pt will demonstrate 3/5 R hip flexion strength to help decrease fall risk when ambulating. Baseline:  Goal status: ONGOING  4.  Pt will be independent in a home exercise program for continued stretching and strengthening.  Baseline:  Goal status: ONGOING   PLAN:  PT FREQUENCY: 2x/week  PT DURATION: 4 weeks  PLANNED INTERVENTIONS: 02835- PT Re-evaluation, 97750- Physical Performance Testing, 97110-Therapeutic exercises, 97530- Therapeutic activity, [id number]- Neuromuscular re-education, 97535- Self Care, [id number]- Manual therapy, [phone]- Gait training, [phone]- Orthotic Initial, [phone]- Orthotic/Prosthetic subsequent, Patient/Family education, Balance training, Joint mobilization, Therapeutic exercises, Therapeutic activity, Neuromuscular re-education, Gait training, and Self Care  PLAN FOR NEXT SESSION: Cont gentle LE strengthening especially R ankle and R hip, standing activities  and gait training as able, especially depending on when she had her last chemotherapy, AFO?   [facility] [place], PT 05/01/2024, 10:04 AM "

## 2024-05-05 ENCOUNTER — Encounter: Payer: Self-pay | Admitting: Physical Therapy

## 2024-05-05 ENCOUNTER — Ambulatory Visit: Admitting: Physical Therapy

## 2024-05-05 ENCOUNTER — Telehealth: Payer: Self-pay

## 2024-05-05 ENCOUNTER — Inpatient Hospital Stay: Attending: Physician Assistant

## 2024-05-05 ENCOUNTER — Other Ambulatory Visit: Payer: Self-pay

## 2024-05-05 VITALS — BP 135/96 | HR 94 | Temp 97.3°F | Resp 17

## 2024-05-05 DIAGNOSIS — R208 Other disturbances of skin sensation: Secondary | ICD-10-CM | POA: Diagnosis not present

## 2024-05-05 DIAGNOSIS — C162 Malignant neoplasm of body of stomach: Secondary | ICD-10-CM | POA: Insufficient documentation

## 2024-05-05 DIAGNOSIS — Z95828 Presence of other vascular implants and grafts: Secondary | ICD-10-CM

## 2024-05-05 DIAGNOSIS — R110 Nausea: Secondary | ICD-10-CM | POA: Insufficient documentation

## 2024-05-05 DIAGNOSIS — C786 Secondary malignant neoplasm of retroperitoneum and peritoneum: Secondary | ICD-10-CM | POA: Insufficient documentation

## 2024-05-05 DIAGNOSIS — E876 Hypokalemia: Secondary | ICD-10-CM | POA: Insufficient documentation

## 2024-05-05 DIAGNOSIS — R2689 Other abnormalities of gait and mobility: Secondary | ICD-10-CM

## 2024-05-05 DIAGNOSIS — M6281 Muscle weakness (generalized): Secondary | ICD-10-CM

## 2024-05-05 MED ORDER — ONDANSETRON HCL 4 MG/2ML IJ SOLN
8.0000 mg | Freq: Once | INTRAMUSCULAR | Status: AC
  Administered 2024-05-05: 8 mg via INTRAVENOUS
  Filled 2024-05-05: qty 4

## 2024-05-05 MED ORDER — SODIUM CHLORIDE 0.9 % IV SOLN: Freq: Once | INTRAVENOUS | Status: AC

## 2024-05-05 NOTE — Telephone Encounter (Signed)
 Received telephone call from the patient requesting IVF today. This medical assistant reached out to the charge nurse - was advised of an 11:30am opening. Let patient know to arrive at 11:15am for 11:30am IVF appt.  Patient voiced understanding.

## 2024-05-05 NOTE — Therapy (Signed)
 Progress Note Reporting Period 03/12/24 to 05/05/24  See note below for Objective Data and Assessment of Progress/Goals.          OUTPATIENT PHYSICAL THERAPY  LOWER EXTREMITY ONCOLOGY TREATMENT  Patient Name: Sheryl [name] MRN: [id number] DOB:[birth year], 59 y.o., female Today's Date: 05/05/2024  END OF SESSION:  PT End of Session - 05/05/24 0906     Visit Number 10    Number of Visits 14    Date for Recertification  05/12/24    Authorization Type [employer] authorized 12 visits 03/12/24-05/11/24 [id number]    Authorization - Visit Number 10    Authorization - Number of Visits 12    PT Start Time 0903    PT Stop Time 0948    PT Time Calculation (min) 45 min    Activity Tolerance Other (comment)   pt limited by dizziness   Behavior During Therapy [facility] for tasks assessed/performed                Past Medical History:  Diagnosis Date   Allergy    Blood transfusion without reported diagnosis    had transfusion with hysterectomy   Cataract    Colon polyps 2012   Diabetes (HCC) 03/13/2021   Diabetes (HCC) 05/21/2019   Family history of breast cancer    Family history of pancreatic cancer    Family history of stomach cancer    Fibroid    gastric ca 03/2022   GERD (gastroesophageal reflux disease)    H/O blood clots    History of hysterectomy    fibroids and heavy cycles   Hypertension    Stomach cancer (HCC) 04/05/22   Biopsy results from Dr Wilene   Past Surgical History:  Procedure Laterality Date   ABDOMINAL HYSTERECTOMY     BIOPSY  04/19/2022   Procedure: BIOPSY;  Surgeon: Wilhelmenia Aloha Raddle., MD;  Location: [facility];  Service: Gastroenterology;;   BIOPSY  03/21/2023   Procedure: BIOPSY;  Surgeon: Wilhelmenia Aloha Raddle., MD;  Location: [facility];  Service: Gastroenterology;;   COLONOSCOPY     ESOPHAGOGASTRODUODENOSCOPY (EGD) WITH PROPOFOL  N/A 04/19/2022   Procedure: ESOPHAGOGASTRODUODENOSCOPY (EGD) WITH PROPOFOL ;  Surgeon: Wilhelmenia Aloha Raddle., MD;  Location: [facility];  Service: Gastroenterology;  Laterality: N/A;   ESOPHAGOGASTRODUODENOSCOPY (EGD) WITH PROPOFOL  N/A 03/21/2023   Procedure: ESOPHAGOGASTRODUODENOSCOPY (EGD) WITH PROPOFOL ;  Surgeon: Wilhelmenia Aloha Raddle., MD;  Location: [facility];  Service: Gastroenterology;  Laterality: N/A;   EUS N/A 04/19/2022   Procedure: UPPER ENDOSCOPIC ULTRASOUND (EUS) RADIAL;  Surgeon: Wilhelmenia Aloha Raddle., MD;  Location: [facility];  Service: Gastroenterology;  Laterality: N/A;   EXCISION OF SKIN TAG  05/03/2022   Procedure: EXCISION OF CHEST WALL SKIN LESION;  Surgeon: Dasie Leonor CROME, MD;  Location: [facility];  Service: General;;   EYE SURGERY  1997   Removed cataracts   LAPAROSCOPY N/A 05/03/2022   Procedure: LAPAROSCOPY DIAGNOSTIC WITH PERITONEAL WASHINGS;  Surgeon: Dasie Leonor CROME, MD;  Location: [facility] OR;  Service: General;  Laterality: N/A;   POLYPECTOMY  04/19/2022   Procedure: POLYPECTOMY;  Surgeon: Wilhelmenia Aloha Raddle., MD;  Location: [facility];  Service: Gastroenterology;;   PORTACATH PLACEMENT N/A 05/03/2022   Procedure: INSERTION PORT-A-CATH WITH ULTRASOUND GUIDANCE;  Surgeon: Dasie Leonor CROME, MD;  Location: [facility];  Service: General;  Laterality: N/A;   UPPER GASTROINTESTINAL ENDOSCOPY     Patient Active Problem List   Diagnosis Date Noted   Type 2 diabetes mellitus with diabetic neuropathy, without long-term current use of  insulin  (HCC) 03/17/2024   Encounter for general adult medical examination with abnormal findings 03/17/2024   Immunization due 03/17/2024   Need for immunization against influenza 03/13/2023   Gastroesophageal reflux disease with esophagitis without hemorrhage 03/13/2023   Port-A-Cath in place 05/23/2022   Genetic testing 05/21/2022   Gastric cancer (HCC) 04/13/2022   Hypertension 01/24/2022   Type II diabetes mellitus with manifestations (HCC) 01/24/2022   Need for vaccination 01/24/2022   Gastroesophageal reflux disease  without esophagitis 01/23/2022   Hyperlipidemia LDL goal <100 01/23/2022   Diuretic-induced hypokalemia 01/23/2022    PCP: Debby Molt, MD  REFERRING PROVIDER: [name], MD  REFERRING DIAG: C16.2 (ICD-10-CM) - Malignant neoplasm of body of stomach (HCC)  THERAPY DIAG:  Other disturbances of skin sensation  Muscle weakness (generalized)  Other abnormalities of gait and mobility  Malignant neoplasm of body of stomach (HCC)  ONSET DATE: 05/09/2022  Rationale for Evaluation and Treatment: Rehabilitation  SUBJECTIVE:                                                                                                                                                                                           SUBJECTIVE STATEMENT: I am feeling good today!   PERTINENT HISTORY: Metastatic gastric cancer with peritoneal metastasis Disease progression on recent PET scan with increased activity in the stomach and peritoneal nodule. Previous treatments included chemotherapy and antibody therapy with limited response. Current treatment with paclitaxel  causing significant neuropathy.Will be switching to oral chemo on Dec 1 in hopes of decreasing neuropathy  PAIN:  Are you having pain? No NPRS scale: Just numbness/tingling bilateral feet 8/10 Pain location: bilateral feet Pain orientation: Bilateral  PAIN TYPE: tingling and numbness Pain description: constant  Aggravating factors: touching the foot (pain increases to 20), walking  Relieving factors: nothing  PRECAUTIONS: Pt reports when she stands for a long time she gets light headed 5-10 min, light headed with bending forward  RED FLAGS: None   WEIGHT BEARING RESTRICTIONS: No  FALLS:  Has patient fallen in last 6 months? Yes. Number of falls 1 (end of Sept 2025) - reports foot turned sideway and she fell on the floor    LIVING ENVIRONMENT: Lives with: lives with their family Lives in: House/apartment Stairs: No;  Has following  equipment at home: Single point cane and Walker - 2 wheeled, unable to drive  OCCUPATION: not working/ disability  LEISURE: does not exercise  PRIOR LEVEL OF FUNCTION: Independent with household mobility with device and Independent with community mobility with device uses wall for balance in the home, makes small meals but would be unable to carry groceries  PATIENT  GOALS: strengthen the feet to be able to drive again   OBJECTIVE: Note: Objective measures were completed at Evaluation unless otherwise noted.  COGNITION: Overall cognitive status: Within functional limits for tasks assessed   SENSATION: Unable to formally assess due to extreme sensitivity in bilateral feet  POSTURE: forward head, rounded shoulders  LOWER EXTREMITY STRENGTH:  MMT Right eval RIGHT 04/28/24  Hip flexion 2-/5 3/5  Hip extension    Hip abduction 2-/5   Hip adduction    Hip internal rotation    Hip external rotation    Knee flexion 3+/5 3+/5  Knee extension 3+/5 4/5  Ankle dorsiflexion 1/5 2+/5  Ankle plantarflexion    Ankle inversion    Ankle eversion    Great toe extension     (Blank rows = not tested)  MMT LEFT eval LEFT 04/28/24  Hip flexion 3/5 3+/5  Hip extension    Hip abduction 3+/5   Hip adduction    Hip internal rotation    Hip external rotation    Knee flexion 4/5 4/5  Knee extension 3+/5 5/5  Ankle dorsiflexion 2+/5 3/5  Ankle plantarflexion    Ankle inversion    Ankle eversion    Great toe extension      (Blank rows = not tested)  FUNCTIONAL TESTS:  05/05/24: Lower Extremity Functional Score: 31 / 80 = 38.8 % At eval: Lower Extremity Functional Score: 11 / 80 = 13.8 %  An LEFS score is interpreted on a scale of 0 to 80, where a higher score indicates better lower extremity function. Scores between 61 and 80 typically represent minimal to normal limitation, scores from 41 to 60 suggest mild to moderate limitation, and scores below 40 indicate moderate to severe  limitation. A score of 0 means a person is unable to perform most activities, while a score of 80 means there is no difficulty.    30 sec sit to stand: Unable   GAIT: Distance walked: 10 ft Assistive device utilized: Single point cane Level of assistance: SBA Comments: slow gait speed, shortened step length, decreased foot clearance, decreased hip flexion on R                                                                                                                            TREATMENT DATE:  05/05/24: Therapeutic Exercises and Acitivities Seated on edge of mat:  Seated on red disc: Alt LAQ with 2 lb ankle weights x 10, 5 sec holds with core engaged but seated on mat; seated on red disc: alt march x 10 each with 2 lb ankle weights; abduction with green loop x 10 reps with 5 sec holds, NuStep: Level 4, - no UEs so pt can focus on legs, seat at 10 x 10 min -  573 steps attempted at level 5 but this was too much Ended session earlier due to dizziness - see assessment for more detail Self Care Need for nutrition to build  muscle and decrease fatigue and how dizziness can be caused by dehydration and poor nutrition    05/01/24: Therapeutic Exercises and Acitivities Seated in chair: Started with both feet on plate 2 x 60 sec to improve neuropathy set to stretch with no shoe on bilateral feet 30Hz , then 1x 60 sec on low, 30 Hz in a long sit hamstring stretch position - 1 leg at a time with 60 sec hold Seated on edge of mat:  Seated on red disc: Alt LAQ with 2 lb ankle weights x 10, 5 sec holds with core engaged but seated on mat; seated on red disc: alt march x 10 each with 2 lb ankle weights; VC's for core engaged for stability throughout , hip abduction with green loop x 10 reps with 5 sec holds, ball squeezes with 5 sec holds x 10 Standing in // bars for following: Standing on blue foam oval: hip SLR into flex, abd, and ext x 10 reps with VC's for correct technique; pt with slow, controlled  motions with no seated recovery periods today, used one HHA when standing on L and 2 HHA when standing on R Standing on purple foam: marching x 10 reps bilaterally then mini squats with occasional HHA with pt returning therapist demo NuStep: Level 4, - no UEs so pt can focus on legs, seat at 10 x 10 min -  503 steps  04/28/24: Reassessed bilateral LE strength Therapeutic Exercises and Acitivities Seated in chair: Started with both feet on plate 2 x 60 sec to improve neuropathy set to stretch with no shoe on bilateral feet 30Hz , then 1x 60 sec on low, 30 Hz in a long sit hamstring stretch position - 1 leg at a time with 60 sec hold Seated rocker board up and down x 30 reps Seated on edge of mat:  Seated on red disc: Alt LAQ with 1 lb ankle weights x 10, 5 sec holds with core engaged but seated on mat; seated on red disc: alt march x 10 each; VC's for core engaged for stability throughout , hip abduction with red loop x 10 reps with 5 sec holds, ball squeezes with 5 sec holds x 10 Feet on rocker board x 10 reps in to DF and PF Standing in // bars for following: Standing on blue foam oval: hip SLR into flex, abd, and ext x 10 reps with VC's for correct technique; pt with slow, controlled motions with no seated recovery periods today, used one HHA when standing on L and 2 HHA when standing on R NuStep: Level 4, - no UEs so pt can focus on legs, seat at 9 x 10 min -  541 steps  04/16/24: Therapeutic Exercises and Acitivities Seated in chair: Started with both feet on plate 2 x 60 sec to improve neuropathy set to stretch with no shoe on bilateral feet 30Hz , then 1x 60 sec on low, 30 Hz in a long sit hamstring stretch position - 1 leg at a time.  Seated rocker board up and down x 30 reps Seated on edge of mat:  Seated on red disc: Alt LAQ with 1 lb ankle weights x 10, 5 sec holds with core engaged but seated on mat; seated on red disc: alt march x 10 each; VC's for core engaged for stability throughout  , hip abduction with red loop x 10 reps with 5 sec holds, ball squeezes with 5 sec holds x 10 Feet on rocker board x 10 reps in to [facility]  and PF Standing in // bars for following: Standing on blue foam oval: hip SLR into flex, abd, and ext x 10 reps with VC's for correct technique; pt with slow, controlled motions with no seated recovery periods today NuStep: Level 4, - no UEs so pt can focus on legs, seat at 9 x 8 min -  415 steps Neuro Re education: In // bars marching on purple foam with no HHA x 7 reps 04/14/24: Therapeutic Exercises and Acitivities Seated in chair: Started with both feet on plate 2 x 30 sec to improve neuropathy set to stretch with no shoe on bilateral feet 30Hz , then 1x 60 sec on low, 30 Hz in a long sit hamstring stretch position - 1 leg at a time.  Seated rocker board up and down x 30 Seated on edge of mat:  Seated on red disc: Alt LAQ x 10, 5 sec holds with core engaged but seated on mat; seated on red disc: alt march x 10 each; VC's for core engaged for stability throughout  Hip abduction with yellow loop x 10 reps with 3 sec holds, ball squeezes with 3 sec holds x 15 Feet on rocker board x 10 reps in to DF and PF Standing in // bars for following: Standing hip SLR into flex, abd, and ext x 10 reps with VC's for correct technique; pt with slow, controlled motions with no seated recovery periods today NuStep: Level 4, - no UEs so pt can focus on legs, seat at 9 x 10 min -  537 steps Neuro Re education: In // bars standing on purple foam with no HHA x , then with 1 HHA marching x 10 on purple foam, then with no HHA but CGA shifting weight from L to R  04/09/24: Therapeutic Exercises and Acitivities Seated in chair: Started with both feet on plate 2 x 30 sec to improve neuropathy set to stretch with no shoe on R foot, 30Hz , then 1x 60 sec on low, with both feet on vibration plate while performing ankle DF/PF AROM with each position then 60 sec x in a long sit hamstring  stretch position - 1 leg at a time. Conts with reports of Lt great toe pain when vibration on Seated rocker board up and down x 15 Seated on edge of mat:  Alt LAQ x 10, 5 sec holds with core engaged but seated on mat- pt reported mild stomach irritation; alt march x 10 each (alternated LAQ with the marching); VC's for core engaged for stability throughout with some soreness noted in core Hip abduction with yellow loop x 10 reps with 3 sec holds, ball squeezes with 3 sec holds x 15 Feet on rocker board x 10 reps in to DF and PF Standing in // bars for following: Standing hip SLR into flex, and ext x 10 reps with VC's for correct technique; pt with slow, controlled motions NuStep: Level 3, - no UEs so pt can focus on legs, seat at 10 x 7 min - 228 steps Self Care Educated pt on what is normal to feel after therapy (soreness) and what is not (sharp pain), how core soreness is normal and to be expected but need to pace to avoid sharp pain but could still be possibly related to new chemo  04/07/24: Therapeutic Exercises and Acitivities Seated in chair: Started with both feet on plate 2 x 30 sec to improve neuropathy set to stretch, 30Hz , 60 sec on low, then removed Rt shoe for 1  foot at a time on vibration plate . Ankle DF/PF AROM with each position. 1 in a long sit hamstring stretch position and one in a foot flat position. Conts with reports of Lt great toe pain when vibration on Seated rocker board up and down x 15 Seated on pink disc for following:  Alt LAQ 2 x 5, 5 sec holds; alt march x 7 each (alternated LAQ with the marching); VC's for core engaged for stability throughout Hip abduction with yellow loop x 10 reps, ball squeezes with 3 sec holds x 15 Standing in // bars for following: Calf stretch standing on incline at end of bars 2 x 20 each leg Standing 3 way hip SLR into flex, abd and ext with VC's for correct technique; pt with slow, controlled motions, pt reports RPE 4-5 after standing  activities seated rest break after NuStep: Level 3, UE 9 /LE 10 x 6 min and 126 steps; first x 3 mins with arms/legs then last x 3 mins with UE's as pt reports she could tell her arms were working more than her legs, seated rest after Self Care During rest breaks spent time educating pt about importance of standing erect as much as able and not letting herself lean forward with her walker. Also answered her questions about how the exercises will help improve her balance and strength.   04/02/24 Seated in chair: 1 foot at a time on vibration plate to improve neuropathy set to relax, 30Hz , 60 sec on low. Ankle DF/PF AROM with each position. 1 in a long sit hamstring stretch position and one in a foot flat position.  Pt reports increased pain in L big toe and complete numbness in R foot Seated rocker board up and down x 5 Foot slide with sock on into toe point x 5  Seated edge of mat: alternating marches x 10 , LAQ with 3 sec holds x 10 each, without shoe to decrease weight,  hip abduction with yellow band x 10 reps, ball squeezes with 3 sec holds x 10 Standing in // bars: calf stretch 2x20 Rt only. Bil hip abduction x 5 with pt able to do the Rt but slow and with effort.   Updated HEP and gave yellow band   03/31/24: Adjusted pt's walker to be appropriate for her height Seated in chair: 1 foot at a time on vibration plate to improve neuropathy set to relax, 30Hz , 60 sec on low. Pt reports increased pain in L big toe and complete numbness in R foot Seated edge of mat: alternating marches x 10 with therapist providing contact to RLE to help with beginning the movement - the last 2 reps pt was able to do without any contact, TKE with 3 sec holds x 10 each with therapist hand behind ankle with very minimal assist to get movement started, heel raises x 10 reps bilaterally, toe raises x 10 bilaterally with trace movement noted on R, hip abduction with yellow band x 10 reps, ball squeezes with 3 sec holds x  10 with increased weakness noted on R Standing in // bars: knee flexion x 10 reps on L and 3 reps on R with increased difficulty noted with this. Pt reported dizziness with standing and then required a seated recovery period  03/12/24- demonstrated seated exercises to pt for pt to begin doing at home - marching, knee extension and ankle DF (for R do in side lying without gravity)   PATIENT EDUCATION:  Education details: seated exercises (  hip flexion, knee extension, ankle DF), in side lying work on R ankle DF without gravity, use walker instead of cane to decrease fall risk Person educated: Patient Education method: Medical Illustrator Education comprehension: verbalized understanding  HOME EXERCISE PROGRAM: Seated hip flexion, knee extension, L ankle DF In side lying work on R ankle DF  Access Code: [email or web address] URL: [email or web address] Date: 04/02/2024 Prepared by: Saddie Raw  Exercises - Seated Long Arc Quad  - 1 x daily - 7 x weekly - 1-3 sets - 10 reps - 3 sec hold - Seated March  - 1 x daily - 7 x weekly - 1-3 sets - 10 reps - no hold - Seated Hip Adduction Isometrics with Ball  - 1 x daily - 7 x weekly - 1-3 sets - 10 reps - no hold - Seated Hip Abduction with Resistance  - 1 x daily - 7 x weekly - 1-3 sets - 10 reps - 3 seconds hold - Seated Heel Toe Raises  - 1 x daily - 7 x weekly - 1-3 sets - 10 reps - no hold - Standing Gastroc Stretch at Counter  - 1 x daily - 7 x weekly - 1 sets - 3 reps - 20-30 seconds hold - Standing Hip Abduction with Counter Support  - 1 x daily - 7 x weekly - 1 sets - 5-10 reps - no hold  ASSESSMENT:  CLINICAL IMPRESSION: Pt felt good at beginning of session but after the NuStep and seated exercises she became dizzy. She was able to drink a cup of water but reports she really has not eaten much over the past few days except a few pieces of zucchini and squash. Discussed how important it is to get proper nutrition when building  muscle and exercising. She also has difficulty staying hydrated due to nausea and vomiting. Therapist ended session early due to pt's dizziness. Pt walked in with her walker but due to dizziness therapist assisted pt to car (her dad drove) with a w/c. She called her doctor in the parking lot to discuss her symptoms and be seen. Pt was going to try and eat some yogurt in the car.   OBJECTIVE IMPAIRMENTS: Abnormal gait, decreased activity tolerance, decreased balance, decreased coordination, decreased knowledge of condition, decreased knowledge of use of DME, decreased mobility, difficulty walking, decreased ROM, decreased strength, impaired sensation, postural dysfunction, and pain.   ACTIVITY LIMITATIONS: bending, standing, squatting, sleeping, stairs, transfers, bed mobility, bathing, dressing, and caring for others  PARTICIPATION LIMITATIONS: meal prep, cleaning, laundry, driving, shopping, and community activity  PERSONAL FACTORS: Fitness and Time since onset of injury/illness/exacerbation are also affecting patient's functional outcome.   REHAB POTENTIAL: Good  CLINICAL DECISION MAKING: Unstable/unpredictable  EVALUATION COMPLEXITY: High   GOALS: Goals reviewed with patient? Yes  SHORT TERM GOALS: Target date: 03/30/24  Pt will demonstrate 2+/5 R hip flexion strength to decrease fall risk. Baseline: Goal status: ONGOING  2.  Pt will obtain an AFO to help with foot drop to decrease fall risk. Baseline:  Goal status: ONGOING - deferred currently due to inability to tolerate anything touching her foot  3.  Pt will demonstrate 2-/5 R ankle DF strength to decrease fall risk.  Baseline:  Goal status: ONGOING - pt beginning to demonstrate some movement against gravity   LONG TERM GOALS: Target date: 04/09/24  Pt will report the numbness and tingling in her feet have improved by at least 40% to allow improved function. Baseline:  Goal status: INITIAL  2.  Pt will demonstrate 3/5 R  hip DF strength to decrease fall risk and increase independence with ambulation. Baseline:  Goal status: ONGOING  3.  Pt will demonstrate 3/5 R hip flexion strength to help decrease fall risk when ambulating. Baseline:  Goal status: ONGOING  4.  Pt will be independent in a home exercise program for continued stretching and strengthening.  Baseline:  Goal status: ONGOING   PLAN:  PT FREQUENCY: 2x/week  PT DURATION: 4 weeks  PLANNED INTERVENTIONS: 02835- PT Re-evaluation, 97750- Physical Performance Testing, 97110-Therapeutic exercises, 97530- Therapeutic activity, [id number]- Neuromuscular re-education, 97535- Self Care, [id number]- Manual therapy, [phone]- Gait training, [phone]- Orthotic Initial, [phone]- Orthotic/Prosthetic subsequent, Patient/Family education, Balance training, Joint mobilization, Therapeutic exercises, Therapeutic activity, Neuromuscular re-education, Gait training, and Self Care  PLAN FOR NEXT SESSION: Cont gentle LE strengthening especially R ankle and R hip, standing activities and gait training as able, especially depending on when she had her last chemotherapy, AFO?   [facility], PT 05/05/2024, 9:58 AM

## 2024-05-05 NOTE — Patient Instructions (Signed)

## 2024-05-06 ENCOUNTER — Inpatient Hospital Stay: Admitting: Dietician

## 2024-05-06 ENCOUNTER — Telehealth: Payer: Self-pay | Admitting: Dietician

## 2024-05-06 NOTE — Telephone Encounter (Signed)
 Nutrition Follow-up:   Patient with gastric cancer. She is currently receiving reduced dose Zolbetuximab/5FU + Leucovorin  q42d (start 07/09/23). Regimen discontinued 8/13 due to progression. Currently receiving dose reduced paclitaxel  + ramucirumab . Patient is under the care of Dr. Lanny.   1/6 - IVF + antiemetics   Spoke with patient via telephone. She is out running errands with her sister. Patient reports feeling much better today after fluids. She has been nibbling on some food. Had a little bit of watermelon and pear juice this morning followed by episode of vomiting. She has tolerated a bagel and bites of chicken since.   Went to [place]  for Christmas with her son and daughter. Had a wonderful time. Reports throwing up everyday while she was there, but enjoyed the trip. Patient reports episodes of vomiting are random and not provoked by oral intake.   Medications: reviewed  Labs: 12/31 - K 3.3  Anthropometrics: Wt 140 lb 3.2 oz on 12/31 decreased 6% in 4 weeks - severe   12/1 - 149 lb 12.8 oz  11/12 - 151 lb 8 oz  10/22 - 154 lb 8 oz    NUTRITION DIAGNOSIS: Unintended wt loss - ongoing    INTERVENTION:  Continue nibbles/sips every 2h  Encourage daily Glucerna - increase if tolerated  Support and encouragement    MONITORING, EVALUATION, GOAL: wt trends, intake   NEXT VISIT: Wednesday February 4 via telephone

## 2024-05-07 ENCOUNTER — Ambulatory Visit: Admitting: Physical Therapy

## 2024-05-11 ENCOUNTER — Ambulatory Visit

## 2024-05-12 ENCOUNTER — Other Ambulatory Visit: Payer: Self-pay | Admitting: Hematology

## 2024-05-12 ENCOUNTER — Other Ambulatory Visit (HOSPITAL_COMMUNITY): Payer: Self-pay

## 2024-05-12 DIAGNOSIS — R1013 Epigastric pain: Secondary | ICD-10-CM

## 2024-05-12 DIAGNOSIS — C162 Malignant neoplasm of body of stomach: Secondary | ICD-10-CM

## 2024-05-12 MED ORDER — HYDROCODONE-ACETAMINOPHEN 5-325 MG PO TABS
1.0000 | ORAL_TABLET | Freq: Two times a day (BID) | ORAL | 0 refills | Status: AC | PRN
  Filled 2024-05-12: qty 60, 30d supply, fill #0

## 2024-05-13 ENCOUNTER — Other Ambulatory Visit (HOSPITAL_COMMUNITY): Payer: Self-pay

## 2024-05-13 MED ORDER — PANTOPRAZOLE SODIUM 40 MG PO TBEC
40.0000 mg | DELAYED_RELEASE_TABLET | Freq: Every day | ORAL | 2 refills | Status: DC
  Filled 2024-05-13: qty 30, 30d supply, fill #0

## 2024-05-14 ENCOUNTER — Inpatient Hospital Stay

## 2024-05-14 ENCOUNTER — Telehealth: Payer: Self-pay

## 2024-05-14 ENCOUNTER — Other Ambulatory Visit: Payer: Self-pay

## 2024-05-14 NOTE — Telephone Encounter (Signed)
 Received telephone call from the patient's son inquiring if the patient can get labs drawn when she comes in Saturday, 05/16/24 for IVF.  Let patient's son know that the lab is closed on Saturday. Patients son requested lab appt for tomorrow - transferred call to scheduler to further assist.

## 2024-05-15 ENCOUNTER — Inpatient Hospital Stay

## 2024-05-15 ENCOUNTER — Other Ambulatory Visit: Payer: Self-pay | Admitting: Hematology

## 2024-05-15 ENCOUNTER — Telehealth: Payer: Self-pay

## 2024-05-15 ENCOUNTER — Other Ambulatory Visit: Payer: Self-pay

## 2024-05-15 DIAGNOSIS — C162 Malignant neoplasm of body of stomach: Secondary | ICD-10-CM

## 2024-05-15 LAB — CBC WITH DIFFERENTIAL (CANCER CENTER ONLY)
Abs Immature Granulocytes: 0.01 K/uL (ref 0.00–0.07)
Basophils Absolute: 0 K/uL (ref 0.0–0.1)
Basophils Relative: 1 %
Eosinophils Absolute: 0 K/uL (ref 0.0–0.5)
Eosinophils Relative: 1 %
HCT: 34.1 % — ABNORMAL LOW (ref 36.0–46.0)
Hemoglobin: 11.6 g/dL — ABNORMAL LOW (ref 12.0–15.0)
Immature Granulocytes: 0 %
Lymphocytes Relative: 17 %
Lymphs Abs: 0.6 K/uL — ABNORMAL LOW (ref 0.7–4.0)
MCH: 27.7 pg (ref 26.0–34.0)
MCHC: 34 g/dL (ref 30.0–36.0)
MCV: 81.4 fL (ref 80.0–100.0)
Monocytes Absolute: 0.3 K/uL (ref 0.1–1.0)
Monocytes Relative: 8 %
Neutro Abs: 2.5 K/uL (ref 1.7–7.7)
Neutrophils Relative %: 73 %
Platelet Count: 294 K/uL (ref 150–400)
RBC: 4.19 MIL/uL (ref 3.87–5.11)
RDW: 19.7 % — ABNORMAL HIGH (ref 11.5–15.5)
WBC Count: 3.5 K/uL — ABNORMAL LOW (ref 4.0–10.5)
nRBC: 0 % (ref 0.0–0.2)

## 2024-05-15 LAB — CMP (CANCER CENTER ONLY)
ALT: <5 U/L (ref 0–44)
AST: 18 U/L (ref 15–41)
Albumin: 4.3 g/dL (ref 3.5–5.0)
Alkaline Phosphatase: 76 U/L (ref 38–126)
Anion gap: 16 — ABNORMAL HIGH (ref 5–15)
BUN: 23 mg/dL — ABNORMAL HIGH (ref 6–20)
CO2: 25 mmol/L (ref 22–32)
Calcium: 9.8 mg/dL (ref 8.9–10.3)
Chloride: 105 mmol/L (ref 98–111)
Creatinine: 0.49 mg/dL (ref 0.44–1.00)
GFR, Estimated: >60 mL/min
Glucose, Bld: 137 mg/dL — ABNORMAL HIGH (ref 70–99)
Potassium: 3.4 mmol/L — ABNORMAL LOW (ref 3.5–5.1)
Sodium: 146 mmol/L — ABNORMAL HIGH (ref 135–145)
Total Bilirubin: 0.7 mg/dL (ref 0.0–1.2)
Total Protein: 7.1 g/dL (ref 6.5–8.1)

## 2024-05-15 LAB — GENETIC SCREENING ORDER

## 2024-05-15 MED ORDER — LONSURF 20-8.19 MG PO TABS
35.0000 mg/m2 | ORAL_TABLET | Freq: Two times a day (BID) | ORAL | 1 refills | Status: DC
  Filled 2024-05-15 – 2024-05-20 (×2): qty 60, 10d supply, fill #0
  Filled 2024-05-22: qty 60, 28d supply, fill #0

## 2024-05-15 NOTE — Telephone Encounter (Signed)
 Contacted patient via telephone call. Let her know her hbg 11.6 Let her know Dr. Lanny did order IVK and it will be given w/ IVF, Saturday 05/16/24. Patient voiced understanding.

## 2024-05-16 ENCOUNTER — Encounter (HOSPITAL_COMMUNITY): Payer: Self-pay

## 2024-05-16 ENCOUNTER — Observation Stay (HOSPITAL_COMMUNITY)
Admission: EM | Admit: 2024-05-16 | Discharge: 2024-05-18 | Disposition: A | Attending: Emergency Medicine | Admitting: Emergency Medicine

## 2024-05-16 ENCOUNTER — Other Ambulatory Visit: Payer: Self-pay

## 2024-05-16 ENCOUNTER — Inpatient Hospital Stay

## 2024-05-16 VITALS — BP 115/97 | HR 108 | Temp 97.2°F | Resp 18

## 2024-05-16 DIAGNOSIS — R112 Nausea with vomiting, unspecified: Principal | ICD-10-CM

## 2024-05-16 DIAGNOSIS — D649 Anemia, unspecified: Secondary | ICD-10-CM | POA: Insufficient documentation

## 2024-05-16 DIAGNOSIS — C786 Secondary malignant neoplasm of retroperitoneum and peritoneum: Secondary | ICD-10-CM | POA: Insufficient documentation

## 2024-05-16 DIAGNOSIS — K3189 Other diseases of stomach and duodenum: Secondary | ICD-10-CM | POA: Insufficient documentation

## 2024-05-16 DIAGNOSIS — Z79899 Other long term (current) drug therapy: Secondary | ICD-10-CM | POA: Insufficient documentation

## 2024-05-16 DIAGNOSIS — E43 Unspecified severe protein-calorie malnutrition: Secondary | ICD-10-CM | POA: Insufficient documentation

## 2024-05-16 DIAGNOSIS — I10 Essential (primary) hypertension: Secondary | ICD-10-CM | POA: Insufficient documentation

## 2024-05-16 DIAGNOSIS — K2101 Gastro-esophageal reflux disease with esophagitis, with bleeding: Secondary | ICD-10-CM | POA: Insufficient documentation

## 2024-05-16 DIAGNOSIS — W449XXA Unspecified foreign body entering into or through a natural orifice, initial encounter: Secondary | ICD-10-CM

## 2024-05-16 DIAGNOSIS — T18128A Food in esophagus causing other injury, initial encounter: Principal | ICD-10-CM | POA: Insufficient documentation

## 2024-05-16 DIAGNOSIS — C162 Malignant neoplasm of body of stomach: Secondary | ICD-10-CM

## 2024-05-16 DIAGNOSIS — E119 Type 2 diabetes mellitus without complications: Secondary | ICD-10-CM | POA: Insufficient documentation

## 2024-05-16 DIAGNOSIS — W44F3XA Food entering into or through a natural orifice, initial encounter: Secondary | ICD-10-CM | POA: Insufficient documentation

## 2024-05-16 DIAGNOSIS — E876 Hypokalemia: Secondary | ICD-10-CM | POA: Diagnosis present

## 2024-05-16 DIAGNOSIS — T18108A Unspecified foreign body in esophagus causing other injury, initial encounter: Secondary | ICD-10-CM | POA: Insufficient documentation

## 2024-05-16 DIAGNOSIS — R739 Hyperglycemia, unspecified: Secondary | ICD-10-CM

## 2024-05-16 DIAGNOSIS — Z85028 Personal history of other malignant neoplasm of stomach: Secondary | ICD-10-CM | POA: Insufficient documentation

## 2024-05-16 DIAGNOSIS — K222 Esophageal obstruction: Secondary | ICD-10-CM

## 2024-05-16 DIAGNOSIS — R748 Abnormal levels of other serum enzymes: Secondary | ICD-10-CM

## 2024-05-16 DIAGNOSIS — C169 Malignant neoplasm of stomach, unspecified: Secondary | ICD-10-CM | POA: Insufficient documentation

## 2024-05-16 DIAGNOSIS — R1013 Epigastric pain: Secondary | ICD-10-CM | POA: Diagnosis present

## 2024-05-16 DIAGNOSIS — D72819 Decreased white blood cell count, unspecified: Secondary | ICD-10-CM | POA: Insufficient documentation

## 2024-05-16 DIAGNOSIS — Z95828 Presence of other vascular implants and grafts: Secondary | ICD-10-CM

## 2024-05-16 DIAGNOSIS — E870 Hyperosmolality and hypernatremia: Secondary | ICD-10-CM

## 2024-05-16 DIAGNOSIS — R933 Abnormal findings on diagnostic imaging of other parts of digestive tract: Secondary | ICD-10-CM | POA: Insufficient documentation

## 2024-05-16 MED ORDER — POTASSIUM CHLORIDE 10 MEQ/100ML IV SOLN
10.0000 meq | INTRAVENOUS | Status: AC
  Administered 2024-05-16 (×2): 10 meq via INTRAVENOUS
  Filled 2024-05-16 (×2): qty 100

## 2024-05-16 MED ORDER — SODIUM CHLORIDE 0.9 % IV SOLN: Freq: Once | INTRAVENOUS | Status: AC

## 2024-05-16 MED ORDER — ONDANSETRON HCL 4 MG/2ML IJ SOLN
8.0000 mg | Freq: Once | INTRAMUSCULAR | Status: AC
  Administered 2024-05-16: 8 mg via INTRAVENOUS
  Filled 2024-05-16: qty 4

## 2024-05-16 MED ORDER — SODIUM CHLORIDE 0.9 % IV SOLN: 25.0000 mg | Freq: Once | INTRAVENOUS | Status: DC

## 2024-05-16 NOTE — ED Triage Notes (Signed)
 Concern for ongoing nausea, vomiting and severe abdominal pain. 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

## 2024-05-16 NOTE — Patient Instructions (Signed)
 Hypokalemia Hypokalemia means that the amount of potassium in the blood is lower than normal. Potassium is a mineral (electrolyte) that helps regulate the amount of fluid in the body. It also stimulates muscle tightening (contraction) and helps nerves work properly. Normally, most of the body's potassium is inside cells, and only a very small amount is in the blood. Because the amount in the blood is so small, minor changes to potassium levels in the blood can be life-threatening. What are the causes? This condition may be caused by: Antibiotic medicine. Diarrhea or vomiting. Taking too much of a medicine that helps you have a bowel movement (laxative) can cause diarrhea and lead to hypokalemia. Chronic kidney disease (CKD). Medicines that help the body get rid of excess fluid (diuretics). Eating disorders, such as anorexia or bulimia. Low magnesium levels in the body. Sweating a lot. What are the signs or symptoms? Symptoms of this condition include: Weakness. Constipation. Fatigue. Muscle cramps. Mental confusion. Skipped heartbeats or irregular heartbeat (palpitations). Tingling or numbness. How is this diagnosed? This condition is diagnosed with a blood test. How is this treated? This condition may be treated by: Taking potassium supplements. Adjusting the medicines that you take. Eating more foods that contain a lot of potassium. If your potassium level is very low, you may need to get potassium through an IV and be monitored in the hospital. Follow these instructions at home: Eating and drinking  Eat a healthy diet. A healthy diet includes fresh fruits and vegetables, whole grains, healthy fats, and lean proteins. If told, eat more foods that contain a lot of potassium. These include: Nuts, such as peanuts and pistachios. Seeds, such as sunflower seeds and pumpkin seeds. Peas, lentils, and lima beans. Whole grain and bran cereals and breads. Fresh fruits and vegetables,  such as apricots, avocado, bananas, cantaloupe, kiwi, oranges, tomatoes, asparagus, and potatoes. Juices, such as orange, tomato, and prune. Lean meats, including fish. Milk and milk products, such as yogurt. General instructions Take over-the-counter and prescription medicines only as told by your health care provider. This includes vitamins, natural food products, and supplements. Keep all follow-up visits. This is important. Contact a health care provider if: You have weakness that gets worse. You feel your heart pounding or racing. You vomit. You have diarrhea. You have diabetes and you have trouble keeping your blood sugar in your target range. Get help right away if: You have chest pain. You have shortness of breath. You have vomiting or diarrhea that lasts for more than 2 days. You faint. These symptoms may be an emergency. Get help right away. Call 911. Do not wait to see if the symptoms will go away. Do not drive yourself to the hospital. Summary Hypokalemia means that the amount of potassium in the blood is lower than normal. This condition is diagnosed with a blood test. Hypokalemia may be treated by taking potassium supplements, adjusting the medicines that you take, or eating more foods that are high in potassium. If your potassium level is very low, you may need to get potassium through an IV and be monitored in the hospital. This information is not intended to replace advice given to you by your health care provider. Make sure you discuss any questions you have with your health care provider. Document Revised: 12/29/2020 Document Reviewed: 12/29/2020 Elsevier Patient Education  2024 [employer].

## 2024-05-17 ENCOUNTER — Encounter (HOSPITAL_COMMUNITY): Admission: EM | Disposition: A | Payer: Self-pay | Source: Home / Self Care | Attending: Emergency Medicine

## 2024-05-17 ENCOUNTER — Emergency Department (HOSPITAL_COMMUNITY)

## 2024-05-17 ENCOUNTER — Observation Stay (HOSPITAL_COMMUNITY): Admitting: Anesthesiology

## 2024-05-17 ENCOUNTER — Encounter (HOSPITAL_COMMUNITY): Payer: Self-pay | Admitting: Student

## 2024-05-17 DIAGNOSIS — T18108A Unspecified foreign body in esophagus causing other injury, initial encounter: Secondary | ICD-10-CM | POA: Diagnosis not present

## 2024-05-17 DIAGNOSIS — C160 Malignant neoplasm of cardia: Secondary | ICD-10-CM

## 2024-05-17 DIAGNOSIS — E119 Type 2 diabetes mellitus without complications: Secondary | ICD-10-CM | POA: Diagnosis not present

## 2024-05-17 DIAGNOSIS — D72819 Decreased white blood cell count, unspecified: Secondary | ICD-10-CM

## 2024-05-17 DIAGNOSIS — D649 Anemia, unspecified: Secondary | ICD-10-CM

## 2024-05-17 DIAGNOSIS — K3189 Other diseases of stomach and duodenum: Secondary | ICD-10-CM

## 2024-05-17 DIAGNOSIS — R112 Nausea with vomiting, unspecified: Secondary | ICD-10-CM | POA: Diagnosis not present

## 2024-05-17 DIAGNOSIS — C786 Secondary malignant neoplasm of retroperitoneum and peritoneum: Secondary | ICD-10-CM | POA: Diagnosis not present

## 2024-05-17 DIAGNOSIS — C7889 Secondary malignant neoplasm of other digestive organs: Secondary | ICD-10-CM

## 2024-05-17 DIAGNOSIS — K222 Esophageal obstruction: Secondary | ICD-10-CM | POA: Diagnosis not present

## 2024-05-17 DIAGNOSIS — E876 Hypokalemia: Secondary | ICD-10-CM | POA: Diagnosis not present

## 2024-05-17 DIAGNOSIS — T18198A Other foreign object in esophagus causing other injury, initial encounter: Secondary | ICD-10-CM | POA: Diagnosis not present

## 2024-05-17 DIAGNOSIS — I10 Essential (primary) hypertension: Secondary | ICD-10-CM

## 2024-05-17 DIAGNOSIS — C162 Malignant neoplasm of body of stomach: Secondary | ICD-10-CM | POA: Diagnosis not present

## 2024-05-17 DIAGNOSIS — R1310 Dysphagia, unspecified: Secondary | ICD-10-CM

## 2024-05-17 DIAGNOSIS — W449XXA Unspecified foreign body entering into or through a natural orifice, initial encounter: Secondary | ICD-10-CM

## 2024-05-17 DIAGNOSIS — R1013 Epigastric pain: Secondary | ICD-10-CM | POA: Diagnosis not present

## 2024-05-17 DIAGNOSIS — K2091 Esophagitis, unspecified with bleeding: Secondary | ICD-10-CM | POA: Diagnosis not present

## 2024-05-17 DIAGNOSIS — T18128A Food in esophagus causing other injury, initial encounter: Principal | ICD-10-CM

## 2024-05-17 HISTORY — PX: ESOPHAGOGASTRODUODENOSCOPY: SHX5428

## 2024-05-17 LAB — COMPREHENSIVE METABOLIC PANEL WITH GFR
ALT: <5 U/L (ref 0–44)
AST: 20 U/L (ref 15–41)
Albumin: 3.9 g/dL (ref 3.5–5.0)
Alkaline Phosphatase: 76 U/L (ref 38–126)
Anion gap: 14 (ref 5–15)
BUN: 15 mg/dL (ref 6–20)
CO2: 25 mmol/L (ref 22–32)
Calcium: 9.4 mg/dL (ref 8.9–10.3)
Chloride: 109 mmol/L (ref 98–111)
Creatinine, Ser: 0.41 mg/dL — ABNORMAL LOW (ref 0.44–1.00)
GFR, Estimated: >60 mL/min
Glucose, Bld: 104 mg/dL — ABNORMAL HIGH (ref 70–99)
Potassium: 3 mmol/L — ABNORMAL LOW (ref 3.5–5.1)
Sodium: 148 mmol/L — ABNORMAL HIGH (ref 135–145)
Total Bilirubin: 0.8 mg/dL (ref 0.0–1.2)
Total Protein: 6.5 g/dL (ref 6.5–8.1)

## 2024-05-17 LAB — CBC WITH DIFFERENTIAL/PLATELET
Abs Immature Granulocytes: 0.01 K/uL (ref 0.00–0.07)
Basophils Absolute: 0 K/uL (ref 0.0–0.1)
Basophils Relative: 0 %
Eosinophils Absolute: 0 K/uL (ref 0.0–0.5)
Eosinophils Relative: 1 %
HCT: 33.2 % — ABNORMAL LOW (ref 36.0–46.0)
Hemoglobin: 10.8 g/dL — ABNORMAL LOW (ref 12.0–15.0)
Immature Granulocytes: 0 %
Lymphocytes Relative: 15 %
Lymphs Abs: 0.5 K/uL — ABNORMAL LOW (ref 0.7–4.0)
MCH: 27.6 pg (ref 26.0–34.0)
MCHC: 32.5 g/dL (ref 30.0–36.0)
MCV: 84.7 fL (ref 80.0–100.0)
Monocytes Absolute: 0.3 K/uL (ref 0.1–1.0)
Monocytes Relative: 10 %
Neutro Abs: 2.3 K/uL (ref 1.7–7.7)
Neutrophils Relative %: 74 %
Platelets: 327 K/uL (ref 150–400)
RBC: 3.92 MIL/uL (ref 3.87–5.11)
RDW: 19.6 % — ABNORMAL HIGH (ref 11.5–15.5)
WBC: 3.1 K/uL — ABNORMAL LOW (ref 4.0–10.5)
nRBC: 0 % (ref 0.0–0.2)

## 2024-05-17 LAB — MAGNESIUM: Magnesium: 1.9 mg/dL (ref 1.7–2.4)

## 2024-05-17 LAB — LIPASE, BLOOD: Lipase: 178 U/L — ABNORMAL HIGH (ref 11–51)

## 2024-05-17 MED ORDER — SUCRALFATE 1 G PO TABS: 1.0000 g | ORAL_TABLET | Freq: Four times a day (QID) | ORAL | Status: DC

## 2024-05-17 MED ORDER — SODIUM CHLORIDE 0.9% FLUSH
10.0000 mL | Freq: Two times a day (BID) | INTRAVENOUS | Status: DC
  Administered 2024-05-17 (×2): 10 mL

## 2024-05-17 MED ORDER — LIDOCAINE 2% (20 MG/ML) 5 ML SYRINGE
INTRAMUSCULAR | Status: DC | PRN
  Administered 2024-05-17: 40 mg via INTRAVENOUS

## 2024-05-17 MED ORDER — PANTOPRAZOLE SODIUM 40 MG IV SOLR
40.0000 mg | Freq: Two times a day (BID) | INTRAVENOUS | Status: DC
  Administered 2024-05-17 – 2024-05-18 (×3): 40 mg via INTRAVENOUS
  Filled 2024-05-17 (×2): qty 10

## 2024-05-17 MED ORDER — SODIUM CHLORIDE 0.9% FLUSH: 10.0000 mL | INTRAVENOUS | Status: DC | PRN

## 2024-05-17 MED ORDER — HYDROCODONE-ACETAMINOPHEN 5-325 MG PO TABS
1.0000 | ORAL_TABLET | Freq: Two times a day (BID) | ORAL | Status: DC | PRN
  Administered 2024-05-18: 1 via ORAL
  Filled 2024-05-17 (×3): qty 1

## 2024-05-17 MED ORDER — ONDANSETRON HCL 4 MG/2ML IJ SOLN
4.0000 mg | Freq: Once | INTRAMUSCULAR | Status: AC
  Administered 2024-05-17: 4 mg via INTRAVENOUS
  Filled 2024-05-17: qty 2

## 2024-05-17 MED ORDER — MORPHINE SULFATE (PF) 2 MG/ML IV SOLN
2.0000 mg | INTRAVENOUS | Status: DC | PRN
  Administered 2024-05-18: 2 mg via INTRAVENOUS
  Filled 2024-05-17 (×2): qty 1

## 2024-05-17 MED ORDER — FENTANYL CITRATE (PF) 100 MCG/2ML IJ SOLN
INTRAMUSCULAR | Status: AC
  Filled 2024-05-17: qty 2

## 2024-05-17 MED ORDER — PANTOPRAZOLE SODIUM 40 MG IV SOLR
40.0000 mg | Freq: Once | INTRAVENOUS | Status: DC
  Filled 2024-05-17: qty 10

## 2024-05-17 MED ORDER — ONDANSETRON HCL 4 MG/2ML IJ SOLN
4.0000 mg | Freq: Four times a day (QID) | INTRAMUSCULAR | Status: DC | PRN
  Administered 2024-05-18: 4 mg via INTRAVENOUS
  Filled 2024-05-17: qty 2

## 2024-05-17 MED ORDER — POTASSIUM CHLORIDE 10 MEQ/100ML IV SOLN
10.0000 meq | INTRAVENOUS | Status: AC
  Administered 2024-05-17 (×4): 10 meq via INTRAVENOUS
  Filled 2024-05-17 (×4): qty 100

## 2024-05-17 MED ORDER — SODIUM CHLORIDE 0.9 % IV BOLUS
1000.0000 mL | Freq: Once | INTRAVENOUS | Status: AC
  Administered 2024-05-17: 1000 mL via INTRAVENOUS

## 2024-05-17 MED ORDER — POTASSIUM CHLORIDE 10 MEQ/100ML IV SOLN
10.0000 meq | INTRAVENOUS | Status: AC
  Administered 2024-05-17 (×2): 10 meq via INTRAVENOUS
  Filled 2024-05-17 (×2): qty 100

## 2024-05-17 MED ORDER — MORPHINE SULFATE (PF) 2 MG/ML IV SOLN
2.0000 mg | Freq: Once | INTRAVENOUS | Status: AC
  Administered 2024-05-17: 2 mg via INTRAVENOUS
  Filled 2024-05-17: qty 1

## 2024-05-17 MED ORDER — SENNOSIDES-DOCUSATE SODIUM 8.6-50 MG PO TABS
1.0000 | ORAL_TABLET | Freq: Every evening | ORAL | Status: DC | PRN
  Administered 2024-05-17: 1 via ORAL
  Filled 2024-05-17: qty 1

## 2024-05-17 MED ORDER — ACETAMINOPHEN 650 MG RE SUPP: 650.0000 mg | Freq: Four times a day (QID) | RECTAL | Status: DC | PRN

## 2024-05-17 MED ORDER — AMLODIPINE BESYLATE 10 MG PO TABS: 10.0000 mg | ORAL_TABLET | Freq: Every day | ORAL | Status: DC

## 2024-05-17 MED ORDER — PROPOFOL 10 MG/ML IV BOLUS
INTRAVENOUS | Status: DC | PRN
  Administered 2024-05-17: 20 mg via INTRAVENOUS
  Administered 2024-05-17 (×2): 100 mg via INTRAVENOUS

## 2024-05-17 MED ORDER — SODIUM CHLORIDE 0.9 % IV SOLN: INTRAVENOUS | Status: AC

## 2024-05-17 MED ORDER — FENTANYL CITRATE (PF) 250 MCG/5ML IJ SOLN
INTRAMUSCULAR | Status: DC | PRN
  Administered 2024-05-17 (×2): 50 ug via INTRAVENOUS

## 2024-05-17 MED ORDER — ONDANSETRON HCL 4 MG PO TABS: 4.0000 mg | ORAL_TABLET | Freq: Four times a day (QID) | ORAL | Status: DC | PRN

## 2024-05-17 MED ORDER — SUCRALFATE 1 GM/10ML PO SUSP
1.0000 g | Freq: Three times a day (TID) | ORAL | Status: DC
  Administered 2024-05-17 – 2024-05-18 (×5): 1 g via ORAL
  Filled 2024-05-17 (×5): qty 10

## 2024-05-17 MED ORDER — BISACODYL 5 MG PO TBEC
5.0000 mg | DELAYED_RELEASE_TABLET | Freq: Every day | ORAL | Status: DC | PRN
  Administered 2024-05-17: 5 mg via ORAL
  Filled 2024-05-17: qty 1

## 2024-05-17 MED ORDER — AMLODIPINE BESYLATE 10 MG PO TABS
10.0000 mg | ORAL_TABLET | Freq: Every day | ORAL | Status: DC
  Administered 2024-05-18: 10 mg via ORAL
  Filled 2024-05-17: qty 1

## 2024-05-17 MED ORDER — MORPHINE SULFATE (PF) 4 MG/ML IV SOLN
4.0000 mg | Freq: Once | INTRAVENOUS | Status: AC
  Administered 2024-05-17: 4 mg via INTRAVENOUS
  Filled 2024-05-17: qty 1

## 2024-05-17 MED ORDER — ACETAMINOPHEN 10 MG/ML IV SOLN
1000.0000 mg | Freq: Four times a day (QID) | INTRAVENOUS | Status: AC | PRN
  Administered 2024-05-17: 1000 mg via INTRAVENOUS
  Filled 2024-05-17: qty 100

## 2024-05-17 MED ORDER — DEXAMETHASONE SOD PHOSPHATE PF 10 MG/ML IJ SOLN
INTRAMUSCULAR | Status: DC | PRN
  Administered 2024-05-17: 10 mg via INTRAVENOUS

## 2024-05-17 MED ORDER — ACETAMINOPHEN 325 MG PO TABS: 650.0000 mg | ORAL_TABLET | Freq: Four times a day (QID) | ORAL | Status: DC | PRN

## 2024-05-17 MED ORDER — ONDANSETRON HCL 4 MG/2ML IJ SOLN
INTRAMUSCULAR | Status: DC | PRN
  Administered 2024-05-17: 4 mg via INTRAVENOUS

## 2024-05-17 MED ORDER — FAMOTIDINE 20 MG PO TABS: 20.0000 mg | ORAL_TABLET | Freq: Two times a day (BID) | ORAL | Status: DC

## 2024-05-17 MED ORDER — ENOXAPARIN SODIUM 40 MG/0.4ML IJ SOSY
40.0000 mg | PREFILLED_SYRINGE | INTRAMUSCULAR | Status: DC
  Administered 2024-05-17 – 2024-05-18 (×2): 40 mg via SUBCUTANEOUS
  Filled 2024-05-17 (×2): qty 0.4

## 2024-05-17 MED ORDER — IOHEXOL 300 MG/ML  SOLN
100.0000 mL | Freq: Once | INTRAMUSCULAR | Status: AC | PRN
  Administered 2024-05-17: 100 mL via INTRAVENOUS

## 2024-05-17 MED ORDER — CHLORHEXIDINE GLUCONATE CLOTH 2 % EX PADS
6.0000 | MEDICATED_PAD | Freq: Every day | CUTANEOUS | Status: DC
  Administered 2024-05-17 – 2024-05-18 (×2): 6 via TOPICAL

## 2024-05-17 MED ORDER — POTASSIUM CHLORIDE 10 MEQ/100ML IV SOLN
INTRAVENOUS | Status: DC | PRN
  Administered 2024-05-17: 10 meq via INTRAVENOUS

## 2024-05-17 NOTE — Consult Note (Signed)
 "  Referring Provider: [facility], Dr. Lou         Primary Care Physician:  [name], MD Primary Gastroenterologist: Dr. Wilhelmenia           Reason for Consultation: Dysphagia with esophageal food impaction                 ASSESSMENT /  PLAN    59 year old female with a history of metastatic gastric carcinoma diagnosed in 2024 on palliative chemotherapy with Dr. Lanny with 4 days of inability to eat or drink due to probable esophageal food impaction.  1.  Acute dysphagia/esophageal food impaction --emergent upper endoscopy with general anesthesia today.  We discussed the higher than average risk of this procedure given the fact that the food impaction/esophageal obstruction has been going on nearly 4 days.  We will try to relieve obstruction but certainly not be able to perform esophageal dilation today. -- EGD -- Further recommendations after procedure  HIGHER THAN BASELINE RISK.The nature of the procedure, as well as the risks, benefits, and alternatives were carefully and thoroughly reviewed with the patient. Ample time for discussion and questions allowed. The patient understood, was satisfied, and agreed to proceed.   2.  Metastatic gastric carcinoma --increasing lymphadenopathy and progressive disease based on imaging performed today. -- Follow-up with Dr. Lanny   HPI:     Sheryl [name] is a 59 y.o. female with a history of metastatic gastric carcinoma diagnosed in 2024, diagnostic laparoscopy with Dr. Dasie in 2024 confirmed metastatic disease and thus resection was not performed, she has been followed and treated by Dr. Lanny with oncology.  She presented to the emergency department early this morning because of generalized abdominal pain and vomiting over the last 4 days.  During this time she had been unable to swallow any meaningful liquids, medications or food.  In the emergency room she was found to have hyponatremia, sodium 148.  Potassium was  3.0 BUN 15, creatinine 0.14 Lipase slightly elevated at 178 AST 20, ALT less than 5, total bili 0.8, alk phos 76 White count 3.1 which is near her baseline Hemoglobin 10.8 Platelet count 327  CT scan was done of the abdomen pelvis with contrast which showed: Progressive esophageal wall thickening in the distal esophagus, 1.3 cm hyperdense structure at the GE junction query food bolus. No bowel obstruction. Circumferential thickened stomach with enhance mucosa compatible with known gastric cancer Interval increased lymph nodes or peritoneal metastasis about the stomach and pancreatic tail Bulky lymphadenopathy some with necrotic components about the stomach and spleen and colon and celiac region Constipation and diverticulosis without diverticulitis  Her last visit with Dr. Lanny was 04/29/2024.  They discussed ongoing palliative chemotherapy.  On 03/30/2024 her chemotherapy was changed to [name]  and after which she was experiencing nausea and decreased.  Requiring intermittent hydration and potassium supplement IV with oncology.  Discussion had with the patient's son at bedside.  Past Medical History:  Diagnosis Date   Allergy    Blood transfusion without reported diagnosis    had transfusion with hysterectomy   Cataract    Colon polyps 2012   Diabetes (HCC) 03/13/2021   Diabetes (HCC) 05/21/2019   Family history of breast cancer    Family history of pancreatic cancer    Family history of stomach cancer    Fibroid    gastric ca 03/2022   GERD (gastroesophageal reflux disease)    H/O blood clots    History of hysterectomy  fibroids and heavy cycles   Hypertension    Stomach cancer (HCC) 04/05/22   Biopsy results from Dr Wilene    Past Surgical History:  Procedure Laterality Date   ABDOMINAL HYSTERECTOMY     BIOPSY  04/19/2022   Procedure: BIOPSY;  Surgeon: Wilhelmenia Aloha Raddle., MD;  Location: [facility];  Service: Gastroenterology;;   BIOPSY  03/21/2023    Procedure: BIOPSY;  Surgeon: Wilhelmenia Aloha Raddle., MD;  Location: [facility];  Service: Gastroenterology;;   COLONOSCOPY     ESOPHAGOGASTRODUODENOSCOPY (EGD) WITH PROPOFOL  N/A 04/19/2022   Procedure: ESOPHAGOGASTRODUODENOSCOPY (EGD) WITH PROPOFOL ;  Surgeon: Wilhelmenia Aloha Raddle., MD;  Location: [facility];  Service: Gastroenterology;  Laterality: N/A;   ESOPHAGOGASTRODUODENOSCOPY (EGD) WITH PROPOFOL  N/A 03/21/2023   Procedure: ESOPHAGOGASTRODUODENOSCOPY (EGD) WITH PROPOFOL ;  Surgeon: Wilhelmenia Aloha Raddle., MD;  Location: [facility];  Service: Gastroenterology;  Laterality: N/A;   EUS N/A 04/19/2022   Procedure: UPPER ENDOSCOPIC ULTRASOUND (EUS) RADIAL;  Surgeon: Wilhelmenia Aloha Raddle., MD;  Location: [facility];  Service: Gastroenterology;  Laterality: N/A;   EXCISION OF SKIN TAG  05/03/2022   Procedure: EXCISION OF CHEST WALL SKIN LESION;  Surgeon: Dasie Leonor CROME, MD;  Location: [facility];  Service: General;;   EYE SURGERY  1997   Removed cataracts   LAPAROSCOPY N/A 05/03/2022   Procedure: LAPAROSCOPY DIAGNOSTIC WITH PERITONEAL WASHINGS;  Surgeon: Dasie Leonor CROME, MD;  Location[facility];  Service: General;  Laterality: N/A;   POLYPECTOMY  04/19/2022   Procedure: POLYPECTOMY;  Surgeon: Wilhelmenia Aloha Raddle., MD;  Location: [facility];  Service: Gastroenterology;;   PORTACATH PLACEMENT N/A 05/03/2022   Procedure: INSERTION PORT-A-CATH WITH ULTRASOUND GUIDANCE;  Surgeon: Dasie Leonor CROME, MD;  Location: [facility];  Service: General;  Laterality: N/A;   UPPER GASTROINTESTINAL ENDOSCOPY      Prior to Admission medications  Medication Sig Start Date End Date Taking? Authorizing Provider  acetaminophen  (TYLENOL ) 500 MG tablet Take 2 tablets (1,000 mg total) by mouth every 8 (eight) hours as needed (pain). 06/11/22   [name], MD  amLODipine  (NORVASC ) 10 MG tablet Take 1 tablet (10 mg total) by mouth daily. 03/17/24   [name], MD  b complex vitamins capsule Take 1 capsule by  mouth daily.    [provider]  calcium -vitamin D (OSCAL WITH D) 500-5 MG-MCG tablet Take 2 tablets by mouth 2 (two) times daily.    [provider]  famotidine  (PEPCID ) 20 MG tablet Take 1 tablet (20 mg total) by mouth 2 (two) times daily. 02/26/24   [name], MD  HYDROcodone -acetaminophen  (NORCO/VICODIN) 5-325 MG tablet Take 1 tablet by mouth every 12 (twelve) hours as needed for moderate pain (pain score 4-6). 05/12/24   [name], MD  lidocaine -prilocaine  (EMLA ) cream Apply 1 Application topically as needed. 12/25/23   [name], MD  metoCLOPramide  (REGLAN ) 10 MG tablet Take 1 tablet (10 mg total) by mouth every 8 (eight) hours as needed for nausea. 10/16/23   Hanford Powell BRAVO, NP  pantoprazole  (PROTONIX ) 40 MG tablet Take 1 tablet (40 mg total) by mouth daily. 05/13/24   [name], MD  potassium chloride  (KLOR-CON  M) 10 MEQ tablet Take 1 tablet (10 mEq total) by mouth 3 (three) times daily. 03/11/24   [name], MD  prochlorperazine  (COMPAZINE ) 10 MG tablet Take 1 tablet (10 mg total) by mouth every 6 (six) hours as needed for nausea or vomiting. 09/17/23   [name], MD  promethazine  (PHENERGAN ) 25 MG tablet Take 2 tablets (50  mg total) by mouth 2 (two) times daily as needed for nausea or vomiting. 02/26/24   [name], MD  scopolamine  (TRANSDERM-SCOP) 1 MG/3DAYS Place 1 patch (1.5 mg total) onto the skin every 3 (three) days. 07/03/23   [name], MD  sucralfate  (CARAFATE ) 1 g tablet Take 1 tablet (1 g total) by mouth 4 (four) times daily. 02/26/24   [name], MD  trifluridine -tipiracil  ([name] ) 20-8.19 MG tablet Take 3 tablets (60 mg of trifluridine  total) by mouth 2 (two) times daily after a meal. Take within 1 hr after AM & PM meals on days 1-5 every 14 days. 05/15/24   [name], MD  zinc gluconate 50 MG tablet Take 50 mg by mouth daily.    [provider]    Current Facility-Administered Medications  Medication Dose Route Frequency Provider Last Rate Last Admin    0.9 %  sodium chloride  infusion   Intravenous Continuous Lou Claretta HERO, MD 100 mL/hr at 05/17/24 0940 New Bag at 05/17/24 0940   [MAR Hold] acetaminophen  (OFIRMEV ) IV 1,000 mg  1,000 mg Intravenous Q6H PRN Amponsah, Prosper M, MD   Stopped at 05/17/24 0930   [MAR Hold] acetaminophen  (TYLENOL ) tablet 650 mg  650 mg Oral Q6H PRN Amponsah, Prosper M, MD       Or   ILDA Hold] acetaminophen  (TYLENOL ) suppository 650 mg  650 mg Rectal Q6H PRN Amponsah, Prosper M, MD       [MAR Hold] bisacodyl  (DULCOLAX) EC tablet 5 mg  5 mg Oral Daily PRN Amponsah, Prosper M, MD       Chlorhexidine  Gluconate Cloth 2 % PADS 6 each  6 each Topical Daily Amponsah, Prosper M, MD       [facility] Hold] enoxaparin  (LOVENOX ) injection 40 mg  40 mg Subcutaneous Q24H Amponsah, Prosper M, MD   40 mg at 05/17/24 1046   [MAR Hold] ondansetron  (ZOFRAN ) tablet 4 mg  4 mg Oral Q6H PRN Amponsah, Prosper M, MD       Or   ILDA Hold] ondansetron  (ZOFRAN ) injection 4 mg  4 mg Intravenous Q6H PRN Amponsah, Prosper M, MD       [MAR Hold] pantoprazole  (PROTONIX ) injection 40 mg  40 mg Intravenous Once Amponsah, Prosper M, MD       potassium chloride  10 mEq in 100 mL IVPB  10 mEq Intravenous Q1 Hr x 2 Lou Claretta HERO, MD 100 mL/hr at 05/17/24 1050 10 mEq at 05/17/24 1050   [MAR Hold] senna-docusate (Senokot-S) tablet 1 tablet  1 tablet Oral QHS PRN Lou Claretta HERO, MD       sodium chloride  flush (NS) 0.9 % injection 10-40 mL  10-40 mL Intracatheter Q12H Lou Claretta HERO, MD       sodium chloride  flush (NS) 0.9 % injection 10-40 mL  10-40 mL Intracatheter PRN Lou Claretta HERO, MD        Allergies as of 05/16/2024 - Review Complete 05/16/2024  Allergen Reaction Noted   Aspirin Anaphylaxis 10/29/2012   Cyclobenzaprine Anaphylaxis 10/29/2012   Naproxen sodium Anaphylaxis 09/14/2010   Zithromax [azithromycin dihydrate] Anaphylaxis 09/14/2010   Oxaliplatin  Other (See Comments) 07/04/2022   Dilaudid [hydromorphone] Nausea And  Vomiting 11/21/2020    Family History  Problem Relation Age of Onset   Stroke Mother    Diabetes Mother    Hypertension Mother    Multiple myeloma Mother    Stroke Father    Pancreatic cancer Maternal Aunt    Stomach cancer Maternal Uncle  Breast cancer Paternal Aunt    Stomach cancer Paternal Aunt    Stomach cancer Paternal Uncle    Heart attack Maternal Grandmother    Breast cancer Paternal Grandmother    Diabetes Other    Hypertension Other    Stroke Other    Cancer Other    Heart attack Other    Esophageal cancer Neg Hx    Liver disease Neg Hx    Colon cancer Neg Hx    Rectal cancer Neg Hx     Social History[1]  Review of Systems: All systems reviewed and negative except where noted in HPI.  Physical Exam: Vital signs in last 24 hours: Temp:  [97.8 F (36.6 C)-98.9 F (37.2 C)] 98.1 F (36.7 C) (01/18 1041) Pulse Rate:  [77-101] 77 (01/18 1041) Resp:  [14-18] 16 (01/18 1041) BP: (117-137)/(87-107) 130/93 (01/18 1041) SpO2:  [100 %] 100 % (01/18 1041) Weight:  [63.5 kg] 63.5 kg (01/17 2357)   Gen: awake, alert, NAD HEENT: anicteric  CV: RRR, no mrg Pulm: CTA b/l Abd: soft, NT/ND, +BS throughout Ext: no c/c/e Neuro: nonfocal    Intake/Output from previous day: No intake/output data recorded. Intake/Output this shift: No intake/output data recorded.  Lab Results: Recent Labs    05/15/24 1039 05/17/24 0120  WBC 3.5* 3.1*  HGB 11.6* 10.8*  HCT 34.1* 33.2*  PLT 294 327   BMET Recent Labs    05/15/24 1039 05/17/24 0120  NA 146* 148*  K 3.4* 3.0*  CL 105 109  CO2 25 25  GLUCOSE 137* 104*  BUN 23* 15  CREATININE 0.49 0.41*  CALCIUM  9.8 9.4   LFT Recent Labs    05/17/24 0120  PROT 6.5  ALBUMIN 3.9  AST 20  ALT <5  ALKPHOS 76  BILITOT 0.8   PT/INR No results for input(s): LABPROT, INR in the last 72 hours. Hepatitis Panel No results for input(s): HEPBSAG, HCVAB, HEPAIGM, HEPBIGM in the last 72 hours.   .     Latest Ref Rng & Units 05/17/2024    1:20 AM 05/15/2024   10:39 AM 04/29/2024    9:56 AM  CBC  WBC 4.0 - 10.5 K/uL 3.1  3.5  2.1   Hemoglobin 12.0 - 15.0 g/dL 89.1  88.3  88.1   Hematocrit 36.0 - 46.0 % 33.2  34.1  33.9   Platelets 150 - 400 K/uL 327  294  254     .    Latest Ref Rng & Units 05/17/2024    1:20 AM 05/15/2024   10:39 AM 04/29/2024    9:56 AM  CMP  Glucose 70 - 99 mg/dL 895  862  99   BUN 6 - 20 mg/dL 15  23  9    Creatinine 0.44 - 1.00 mg/dL 9.58  9.50  9.40   Sodium 135 - 145 mmol/L 148  146  139   Potassium 3.5 - 5.1 mmol/L 3.0  3.4  3.3   Chloride 98 - 111 mmol/L 109  105  102   CO2 22 - 32 mmol/L 25  25  27    Calcium  8.9 - 10.3 mg/dL 9.4  9.8  9.4   Total Protein 6.5 - 8.1 g/dL 6.5  7.1  6.7   Total Bilirubin 0.0 - 1.2 mg/dL 0.8  0.7  0.6   Alkaline Phos 38 - 126 U/L 76  76  76   AST 15 - 41 U/L 20  18  22    ALT 0 - 44 U/L <  5  <5  6    Studies/Results: CT ABDOMEN PELVIS W CONTRAST Result Date: 05/17/2024 EXAM: CT ABDOMEN AND PELVIS WITH CONTRAST 05/17/2024 02:42:58 AM TECHNIQUE: CT of the abdomen and pelvis was performed with the administration of intravenous contrast. Multiplanar reformatted images are provided for review. Automated exposure control, iterative reconstruction, and/or weight-based adjustment of the mA/kV was utilized to reduce the radiation dose to as low as reasonably achievable. COMPARISON: PET CT 03/02/2024 and 12/23/2023, and CT abdomen and pelvis with iv contrast 10/10/2023. CLINICAL HISTORY: Nausea, vomiting, and severe abdominal pain. The patient has stage 4 stomach cancer, recently completed oral chemotherapy this week. The last PET CT demonstrated disease progression since 11/2023. FINDINGS: LOWER CHEST: No acute abnormality. LIVER: The liver is unremarkable. There is no liver mass. GALLBLADDER AND BILE DUCTS: The gallbladder is more distended than on prior studies, but otherwise unremarkable. No biliary ductal dilatation. SPLEEN: No acute  abnormality. PANCREAS: No pancreatic mass or ductal dilatation. There is interval increased prominence of lymph nodes or peritoneal metastases posterior and medial to the proximal stomach, and many of these abut or are very close to the body and tail segment of the pancreas . Largest of these is 3.8 x 1.9 cm alongside the pancreatic tail on series 2 axial 21. This may not have even been present previously. In addition to the above, there is a new celiac level lymph node posterior to the body of the pancreas measuring 2.7 x 1.7 cm on axial image 21. ADRENAL GLANDS: No adrenal mass. KIDNEYS, URETERS AND BLADDER: There is no right renal mass. A large 8 cm cyst with a few thin septations is again noted in the mid to lower pole of the left kidney. No follow-up imaging is recommended. No urinary stone is seen. No solid renal mass. No hydronephrosis. No perinephric or periureteral stranding. Urinary bladder is unremarkable. GI AND BOWEL: There is progressive circumferential wall thickening in the distal esophagus compared with the PET CT 2 months ago. There is a 1.3 cm hyperdense structure in the lumen at the EG junction, which could be a stuck food bolus. The increased wall thickening could be due to esophagitis or extension of infiltrating disease from the stomach. Circumferentially thickened stomach with enhancing mucosa is again noted compatible with known gastric cancer. There is no small bowel obstruction or inflammation. The appendix is slightly prominent but unchanged. There is moderate fecal stasis. Sigmoid diverticulosis without diverticulitis. PERITONEUM AND RETROPERITONEUM: See below for description of adenopathy. No ascites. No free air. VASCULATURE: Aorta is normal in caliber. There is mild aortic atherosclerosis. No aneurysm. LYMPH NODES: Interval increased prominence of lymph nodes or peritoneal metastases posterior and medial to the proximal stomach. Largest of these is 3.8 x 1.9 cm alongside the  pancreatic tail on series 2 axial 21. This may not have even been present previously. New celiac level lymph node posterior to the body of the pancreas measuring 2.7 x 1.7 cm on axial image 21. Clustered bulky lymph nodes or peritoneal implants up to 2.8 x 2.2 cm on axial image 19, formerly 1.7 x 1.1 cm. Centrally necrotic peritoneal implant to the left of the splenic flexure measuring 3.3 x 2.2 cm on axial 20, previously 1.3 x 1 cm. REPRODUCTIVE ORGANS: The uterus is surgically absent. No adnexal mass is seen. BONES AND SOFT TISSUES: Degenerative changes of the lumbar spine. No regional bone metastases is seen. No acute osseous abnormality. No focal soft tissue abnormality. Numerous pelvic phleboliths. IMPRESSION: 1. Progressive circumferential wall  thickening in the distal esophagus, possibly due to esophagitis or extension of infiltrating disease from the stomach, with a 1.3 cm hyperdense structure at the EG junction that may represent a stuck food bolus. 2. No small bowel obstruction. 3. Circumferentially thickened stomach with enhancing mucosa, compatible with known gastric cancer. 4. Interval increased prominence of lymph nodes or peritoneal metastases posterior and medial to the proximal stomach and about the pancreatic tail , with the largest measuring 3.8 x 1.9 cm alongside the pancreatic tail. 5. Clustered bulky lymph nodes or peritoneal implants posteromedial to the stomach up to 2.8 x 2.2 cm, previously 1.7 x 1.1 cm, and a centrally necrotic peritoneal implant to the left of the splenic flexure measuring 3.3 x 2.2 cm, previously 1.3 x 1 cm. 6. New celiac level lymph node posterior to the body of the pancreas measuring 2.7 x 1.7 cm. 7. Constipation and diverticulosis. Electronically signed by: Francis Quam MD 05/17/2024 03:35 AM EST RP Workstation: [id number]    Principal Problem:   Esophageal obstruction    Gordy HERO. Noris Kulinski, M.D. @  05/17/2024, 11:22 AM        [1]  Social History Tobacco  Use   Smoking status: Never   Smokeless tobacco: Never  Vaping Use   Vaping status: Never Used  Substance Use Topics   Alcohol use: No   Drug use: No   "

## 2024-05-17 NOTE — ED Provider Notes (Signed)
 " [place] EMERGENCY DEPARTMENT AT [facility] Provider Note   CSN: [id number] Arrival date & time: 05/16/24  2336     Patient presents with: Abdominal Pain   Sheryl [name] is a 59 y.o. female.   The history is provided by the patient.  Abdominal Pain  She has history of hypertension, diabetes, stomach cancer, GERD and comes in because of generalized abdominal pain and vomiting for approximately the last 4 days.  During this time, she has not been able to swallow anything without vomiting-including her medications.  There has been no fever.  She has not had a bowel movement although she has not been eating.  Patient is very concerned as she knows she is dying and wants to die at home and does not want to come in the hospital.    Prior to Admission medications  Medication Sig Start Date End Date Taking? Authorizing Provider  acetaminophen  (TYLENOL ) 500 MG tablet Take 2 tablets (1,000 mg total) by mouth every 8 (eight) hours as needed (pain). 06/11/22   [name], MD  amLODipine  (NORVASC ) 10 MG tablet Take 1 tablet (10 mg total) by mouth daily. 03/17/24   [name], MD  b complex vitamins capsule Take 1 capsule by mouth daily.    [provider]  calcium -vitamin D (OSCAL WITH D) 500-5 MG-MCG tablet Take 2 tablets by mouth 2 (two) times daily.    [provider]  famotidine  (PEPCID ) 20 MG tablet Take 1 tablet (20 mg total) by mouth 2 (two) times daily. 02/26/24   [name], MD  HYDROcodone -acetaminophen  (NORCO/VICODIN) 5-325 MG tablet Take 1 tablet by mouth every 12 (twelve) hours as needed for moderate pain (pain score 4-6). 05/12/24   [name], MD  lidocaine -prilocaine  (EMLA ) cream Apply 1 Application topically as needed. 12/25/23   [name], MD  metoCLOPramide  (REGLAN ) 10 MG tablet Take 1 tablet (10 mg total) by mouth every 8 (eight) hours as needed for nausea. 10/16/23   Hanford Powell BRAVO, NP  pantoprazole  (PROTONIX ) 40 MG tablet Take 1 tablet  (40 mg total) by mouth daily. 05/13/24   [name], MD  potassium chloride  (KLOR-CON  M) 10 MEQ tablet Take 1 tablet (10 mEq total) by mouth 3 (three) times daily. 03/11/24   [name], MD  prochlorperazine  (COMPAZINE ) 10 MG tablet Take 1 tablet (10 mg total) by mouth every 6 (six) hours as needed for nausea or vomiting. 09/17/23   [name], MD  promethazine  (PHENERGAN ) 25 MG tablet Take 2 tablets (50 mg total) by mouth 2 (two) times daily as needed for nausea or vomiting. 02/26/24   [name], MD  scopolamine  (TRANSDERM-SCOP) 1 MG/3DAYS Place 1 patch (1.5 mg total) onto the skin every 3 (three) days. 07/03/23   [name], MD  sucralfate  (CARAFATE ) 1 g tablet Take 1 tablet (1 g total) by mouth 4 (four) times daily. 02/26/24   [name], MD  trifluridine -tipiracil  ([name] ) 20-8.19 MG tablet Take 3 tablets (60 mg of trifluridine  total) by mouth 2 (two) times daily after a meal. Take within 1 hr after AM & PM meals on days 1-5 every 14 days. 05/15/24   [name], MD  zinc gluconate 50 MG tablet Take 50 mg by mouth daily.    [provider]    Allergies: Aspirin, Cyclobenzaprine, Naproxen sodium, Zithromax [azithromycin dihydrate], Oxaliplatin , and Dilaudid [hydromorphone]    Review of Systems  Gastrointestinal:  Positive for abdominal pain.  All other systems reviewed and are  negative.   Updated Vital Signs BP (!) 136/107   Pulse (!) 101   Temp 98.9 F (37.2 C) (Oral)   Resp 14   Ht 5' 8 (1.727 m)   Wt 63.5 kg   LMP 06/29/2010   SpO2 100%   BMI 21.29 kg/m   Physical Exam Vitals and nursing note reviewed.   Cachectic 59 year old female, resting comfortably and in no acute distress. Vital signs are significant for borderline elevated heart rate and elevated diastolic blood pressure. Oxygen  saturation is 100%, which is normal. Head is normocephalic and atraumatic. PERRLA, EOMI.  Lungs are clear without rales, wheezes, or rhonchi. Chest is nontender. Heart has regular rate and  rhythm without murmur. Abdomen is soft, flat, nontender. Neurologic: Awake and alert, moves all extremities equally.  (all labs ordered are listed, but only abnormal results are displayed) Labs Reviewed  COMPREHENSIVE METABOLIC PANEL WITH GFR - Abnormal; Notable for the following components:      Result Value   Sodium 148 (*)    Potassium 3.0 (*)    Glucose, Bld 104 (*)    Creatinine, Ser 0.41 (*)    All other components within normal limits  LIPASE, BLOOD - Abnormal; Notable for the following components:   Lipase 178 (*)    All other components within normal limits  CBC WITH DIFFERENTIAL/PLATELET - Abnormal; Notable for the following components:   WBC 3.1 (*)    Hemoglobin 10.8 (*)    HCT 33.2 (*)    RDW 19.6 (*)    Lymphs Abs 0.5 (*)    All other components within normal limits  MAGNESIUM     Radiology: CT ABDOMEN PELVIS W CONTRAST Result Date: 05/17/2024 EXAM: CT ABDOMEN AND PELVIS WITH CONTRAST 05/17/2024 02:42:58 AM TECHNIQUE: CT of the abdomen and pelvis was performed with the administration of intravenous contrast. Multiplanar reformatted images are provided for review. Automated exposure control, iterative reconstruction, and/or weight-based adjustment of the mA/kV was utilized to reduce the radiation dose to as low as reasonably achievable. COMPARISON: PET CT 03/02/2024 and 12/23/2023, and CT abdomen and pelvis with iv contrast 10/10/2023. CLINICAL HISTORY: Nausea, vomiting, and severe abdominal pain. The patient has stage 4 stomach cancer, recently completed oral chemotherapy this week. The last PET CT demonstrated disease progression since 11/2023. FINDINGS: LOWER CHEST: No acute abnormality. LIVER: The liver is unremarkable. There is no liver mass. GALLBLADDER AND BILE DUCTS: The gallbladder is more distended than on prior studies, but otherwise unremarkable. No biliary ductal dilatation. SPLEEN: No acute abnormality. PANCREAS: No pancreatic mass or ductal dilatation. There is  interval increased prominence of lymph nodes or peritoneal metastases posterior and medial to the proximal stomach, and many of these abut or are very close to the body and tail segment of the pancreas . Largest of these is 3.8 x 1.9 cm alongside the pancreatic tail on series 2 axial 21. This may not have even been present previously. In addition to the above, there is a new celiac level lymph node posterior to the body of the pancreas measuring 2.7 x 1.7 cm on axial image 21. ADRENAL GLANDS: No adrenal mass. KIDNEYS, URETERS AND BLADDER: There is no right renal mass. A large 8 cm cyst with a few thin septations is again noted in the mid to lower pole of the left kidney. No follow-up imaging is recommended. No urinary stone is seen. No solid renal mass. No hydronephrosis. No perinephric or periureteral stranding. Urinary bladder is unremarkable. GI AND BOWEL: There is  progressive circumferential wall thickening in the distal esophagus compared with the PET CT 2 months ago. There is a 1.3 cm hyperdense structure in the lumen at the EG junction, which could be a stuck food bolus. The increased wall thickening could be due to esophagitis or extension of infiltrating disease from the stomach. Circumferentially thickened stomach with enhancing mucosa is again noted compatible with known gastric cancer. There is no small bowel obstruction or inflammation. The appendix is slightly prominent but unchanged. There is moderate fecal stasis. Sigmoid diverticulosis without diverticulitis. PERITONEUM AND RETROPERITONEUM: See below for description of adenopathy. No ascites. No free air. VASCULATURE: Aorta is normal in caliber. There is mild aortic atherosclerosis. No aneurysm. LYMPH NODES: Interval increased prominence of lymph nodes or peritoneal metastases posterior and medial to the proximal stomach. Largest of these is 3.8 x 1.9 cm alongside the pancreatic tail on series 2 axial 21. This may not have even been present  previously. New celiac level lymph node posterior to the body of the pancreas measuring 2.7 x 1.7 cm on axial image 21. Clustered bulky lymph nodes or peritoneal implants up to 2.8 x 2.2 cm on axial image 19, formerly 1.7 x 1.1 cm. Centrally necrotic peritoneal implant to the left of the splenic flexure measuring 3.3 x 2.2 cm on axial 20, previously 1.3 x 1 cm. REPRODUCTIVE ORGANS: The uterus is surgically absent. No adnexal mass is seen. BONES AND SOFT TISSUES: Degenerative changes of the lumbar spine. No regional bone metastases is seen. No acute osseous abnormality. No focal soft tissue abnormality. Numerous pelvic phleboliths. IMPRESSION: 1. Progressive circumferential wall thickening in the distal esophagus, possibly due to esophagitis or extension of infiltrating disease from the stomach, with a 1.3 cm hyperdense structure at the EG junction that may represent a stuck food bolus. 2. No small bowel obstruction. 3. Circumferentially thickened stomach with enhancing mucosa, compatible with known gastric cancer. 4. Interval increased prominence of lymph nodes or peritoneal metastases posterior and medial to the proximal stomach and about the pancreatic tail , with the largest measuring 3.8 x 1.9 cm alongside the pancreatic tail. 5. Clustered bulky lymph nodes or peritoneal implants posteromedial to the stomach up to 2.8 x 2.2 cm, previously 1.7 x 1.1 cm, and a centrally necrotic peritoneal implant to the left of the splenic flexure measuring 3.3 x 2.2 cm, previously 1.3 x 1 cm. 6. New celiac level lymph node posterior to the body of the pancreas measuring 2.7 x 1.7 cm. 7. Constipation and diverticulosis. Electronically signed by: Francis Quam MD 05/17/2024 03:35 AM EST RP Workstation: [id number]     Procedures   Medications Ordered in the ED  potassium chloride  10 mEq in 100 mL IVPB (10 mEq Intravenous New Bag/Given 05/17/24 0648)  sodium chloride  0.9 % bolus 1,000 mL (0 mLs Intravenous Stopped 05/17/24  0245)  morphine  (PF) 4 MG/ML injection 4 mg (4 mg Intravenous Given 05/17/24 0119)  ondansetron  (ZOFRAN ) injection 4 mg (4 mg Intravenous Given 05/17/24 0120)  iohexol  (OMNIPAQUE ) 300 MG/ML solution 100 mL (100 mLs Intravenous Contrast Given 05/17/24 0231)                                    Medical Decision Making Amount and/or Complexity of Data Reviewed Labs: ordered. Radiology: ordered.  Risk Prescription drug management. Decision regarding hospitalization.   Abdominal pain and vomiting in patient with known terminal gastric cancer.  Differential diagnosis includes, but  is not limited to, bowel obstruction, esophageal obstruction, perforated viscus, systemic effects of cancer.  I have reviewed her past records, and note that she is currently on [name]  as a palliative treatment.  She had an upper endoscopy on 03/21/2023 which showed no esophageal lesions making esophageal obstruction unlikely.  I have ordered IV fluids, morphine  for pain, ondansetron  for nausea and I have ordered CT of abdomen and pelvis.  I have reviewed her laboratory tests, and my interpretation is hyponatremia which has progressed slightly compared with 1/16, hypokalemia which has worsened compared with 1/16, borderline elevated random glucose level, elevated lipase concerning for possible pancreatitis, leukopenia likely secondary to chemotherapy, anemia which has progressed somewhat compared with 01/16.  CT scan shows progressive circumferential wall thickening of the distal esophagus with a 1.3 cm hyperdense structure at the EG junction that may represent stuck food bolus, no evidence of pancreatitis, increased prominence of lymph nodes.  I have independently viewed the images, and agree with the radiologist's interpretation.  On further questioning, patient states that she was able to hold food down until about 5 days ago and she thinks that one of the last things that she had been able to eat was some fish.  She will  need upper endoscopy.  However, with the length of time that symptoms have been present and her generally debilitated condition, she is at high risk for complications.  I feel she needs to be admitted for possible optimization of her condition prior to EGD.  I have discussed case with Dr. Debby of [facility] hospitalist who agrees to admit the patient.  I have also discussed the case with Dr. Charlanne of gastroenterology service who states that he will make arrangements for upper endoscopy.     Final diagnoses:  Intractable nausea and vomiting  Obstruction of distal esophagus due to foreign body  Hypokalemia  Hypernatremia  Elevated random blood glucose level  Normochromic normocytic anemia  Elevated lipase  Leukopenia, unspecified type    ED Discharge Orders     None          Raford Lenis, MD 05/17/24 [phone]  "

## 2024-05-17 NOTE — Anesthesia Preprocedure Evaluation (Addendum)
"                                    Anesthesia Evaluation  Patient identified by MRN, date of birth, ID band Patient awake    Reviewed: Allergy & Precautions, NPO status , Patient's Chart, lab work & pertinent test results  Airway Mallampati: II  TM Distance: >3 FB Neck ROM: Full    Dental  (+) Teeth Intact, Dental Advisory Given   Pulmonary    breath sounds clear to auscultation       Cardiovascular hypertension,  Rhythm:Regular Rate:Normal     Neuro/Psych    GI/Hepatic Neg liver ROS,GERD  ,,Metastatic gastric CA on chemo   Endo/Other  diabetes, Type 2    Renal/GU negative Renal ROS     Musculoskeletal   Abdominal   Peds  Hematology  (+) Blood dyscrasia, anemia   Anesthesia Other Findings   Reproductive/Obstetrics                              Anesthesia Physical Anesthesia Plan  ASA: 3  Anesthesia Plan: General   Post-op Pain Management: Minimal or no pain anticipated   Induction: Intravenous and Rapid sequence  PONV Risk Score and Plan: 3 and Dexamethasone , Ondansetron  and Treatment may vary due to age or medical condition  Airway Management Planned: Oral ETT  Additional Equipment:   Intra-op Plan:   Post-operative Plan: Extubation in OR  Informed Consent: I have reviewed the patients History and Physical, chart, labs and discussed the procedure including the risks, benefits and alternatives for the proposed anesthesia with the patient or authorized representative who has indicated his/her understanding and acceptance.     Dental advisory given  Plan Discussed with: CRNA  Anesthesia Plan Comments:         Anesthesia Quick Evaluation  "

## 2024-05-17 NOTE — Anesthesia Procedure Notes (Signed)
 Procedure Name: Intubation Date/Time: 05/17/2024 11:47 AM  Performed by: Hanley Delon POUR, CRNAPre-anesthesia Checklist: Patient identified, Emergency Drugs available, Suction available and Patient being monitored Patient Re-evaluated:Patient Re-evaluated prior to induction Oxygen  Delivery Method: Circle System Utilized Preoxygenation: Pre-oxygenation with 100% oxygen  Induction Type: IV induction, Rapid sequence and Cricoid Pressure applied Laryngoscope Size: Miller and 3 Grade View: Grade I Tube type: Oral Tube size: 7.0 mm Number of attempts: 1 Airway Equipment and Method: Stylet and Oral airway Placement Confirmation: ETT inserted through vocal cords under direct vision, positive ETCO2 and breath sounds checked- equal and bilateral Secured at: 21 cm Tube secured with: Tape Dental Injury: Teeth and Oropharynx as per pre-operative assessment

## 2024-05-17 NOTE — Anesthesia Postprocedure Evaluation (Signed)
"   Anesthesia Post Note  [name]  Procedure(s) Performed: EGD (ESOPHAGOGASTRODUODENOSCOPY)     Patient location during evaluation: PACU Anesthesia Type: General Level of consciousness: awake and alert Pain management: pain level controlled Vital Signs Assessment: post-procedure vital signs reviewed and stable Respiratory status: spontaneous breathing, nonlabored ventilation, respiratory function stable and patient connected to nasal cannula oxygen  Cardiovascular status: blood pressure returned to baseline and stable Postop Assessment: no apparent nausea or vomiting Anesthetic complications: no   No notable events documented.  Last Vitals:  Vitals:   05/17/24 1250 05/17/24 1306  BP:  (!) 131/92  Pulse: 89 84  Resp: 17 15  Temp:  36.6 C  SpO2: 99% 100%    Last Pain:  Vitals:   05/17/24 1306  TempSrc: Oral  PainSc: 0-No pain                 Epifanio Lamar BRAVO      "

## 2024-05-17 NOTE — H&P (Signed)
 " History and Physical  Sheryl [name] FMW:[id number] DOB: [birth year] DOA: 05/16/2024  PCP: Sheryl Debby CROME, MD   Chief Complaint: Nausea and vomiting  HPI: Sheryl [name] is a 59 y.o. female with medical history significant for metastatic gastric cancer with peritoneal metastases on chemotherapy, leukopenia, anemia, T2DM, HTN and GERD with esophagitis who presented to the ED for evaluation of nausea and vomiting. Patient reports that for the last 4 to 5 days, she has has had persistent nausea and vomiting.  Any food or liquid she swallows comes right back up. She also endorsed some generalized upper abdominal pain and fatigue but denies any fevers, chills, chest pain, shortness of breath or cough.  ED Course: Initial vitals show patient afebrile, stable BP with SBP in the 110-130s. Initial labs significant for sodium 148, K+ 3.0, lipase 178, WBC 3.1, Hgb 10.8.  CT A/P shows a 1.3 cm hyperdense structure at the EG junction that may represent a stuck food bolus, esophagitis, known gastric malignancy, interval increase in peritoneal and lymph node metastases but no bowel obstruction. pt received IV morphine , IV NS 1 L bolus, IV KCl 10 meq x 4.  GI (Dr. Charlanne) was consulted for evaluation. [facility] was consulted for admission.   Review of Systems: Please see HPI for pertinent positives and negatives. A complete 10 system review of systems are otherwise negative.  Past Medical History:  Diagnosis Date   Allergy    Blood transfusion without reported diagnosis    had transfusion with hysterectomy   Cataract    Colon polyps 2012   Diabetes (HCC) 03/13/2021   Diabetes (HCC) 05/21/2019   Family history of breast cancer    Family history of pancreatic cancer    Family history of stomach cancer    Fibroid    gastric ca 03/2022   GERD (gastroesophageal reflux disease)    H/O blood clots    History of hysterectomy    fibroids and heavy cycles   Hypertension    Stomach cancer (HCC) 04/05/22   Biopsy  results from Dr Wilene   Past Surgical History:  Procedure Laterality Date   ABDOMINAL HYSTERECTOMY     BIOPSY  04/19/2022   Procedure: BIOPSY;  Surgeon: Sheryl [name]., MD;  Location: [facility];  Service: Gastroenterology;;   BIOPSY  03/21/2023   Procedure: BIOPSY;  Surgeon: Sheryl [name]., MD;  Location: [facility];  Service: Gastroenterology;;   COLONOSCOPY     ESOPHAGOGASTRODUODENOSCOPY (EGD) WITH PROPOFOL  N/A 04/19/2022   Procedure: ESOPHAGOGASTRODUODENOSCOPY (EGD) WITH PROPOFOL ;  Surgeon: Sheryl [name]., MD;  Location: [facility];  Service: Gastroenterology;  Laterality: N/A;   ESOPHAGOGASTRODUODENOSCOPY (EGD) WITH PROPOFOL  N/A 03/21/2023   Procedure: ESOPHAGOGASTRODUODENOSCOPY (EGD) WITH PROPOFOL ;  Surgeon: Sheryl [name]., MD;  Location: [facility];  Service: Gastroenterology;  Laterality: N/A;   EUS N/A 04/19/2022   Procedure: UPPER ENDOSCOPIC ULTRASOUND (EUS) RADIAL;  Surgeon: Sheryl [name]., MD;  Location: [facility];  Service: Gastroenterology;  Laterality: N/A;   EXCISION OF SKIN TAG  05/03/2022   Procedure: EXCISION OF CHEST WALL SKIN LESION;  Surgeon: Sheryl Leonor CROME, MD;  Location: [facility];  Service: General;;   EYE SURGERY  1997   Removed cataracts   LAPAROSCOPY N/A 05/03/2022   Procedure: LAPAROSCOPY DIAGNOSTIC WITH PERITONEAL WASHINGS;  Surgeon: Sheryl Leonor CROME, MD;  Location: [facility] OR;  Service: General;  Laterality: N/A;   POLYPECTOMY  04/19/2022   Procedure: POLYPECTOMY;  Surgeon: Sheryl [name]., MD;  Location: [facility];  Service:  Gastroenterology;;   PORTACATH PLACEMENT N/A 05/03/2022   Procedure: INSERTION PORT-A-CATH WITH ULTRASOUND GUIDANCE;  Surgeon: Sheryl Leonor CROME, MD;  Location: [facility] OR;  Service: General;  Laterality: N/A;   UPPER GASTROINTESTINAL ENDOSCOPY     Social History:  reports that she has never smoked. She has never used smokeless tobacco. She reports that she does not drink alcohol and  does not use drugs.  Allergies[1]  Family History  Problem Relation Age of Onset   Stroke Mother    Diabetes Mother    Hypertension Mother    Multiple myeloma Mother    Stroke Father    Pancreatic cancer Maternal Aunt    Stomach cancer Maternal Uncle    Breast cancer Paternal Aunt    Stomach cancer Paternal Aunt    Stomach cancer Paternal Uncle    Heart attack Maternal Grandmother    Breast cancer Paternal Grandmother    Diabetes Other    Hypertension Other    Stroke Other    Cancer Other    Heart attack Other    Esophageal cancer Neg Hx    Liver disease Neg Hx    Colon cancer Neg Hx    Rectal cancer Neg Hx      Prior to Admission medications  Medication Sig Start Date End Date Taking? Authorizing Provider  acetaminophen  (TYLENOL ) 500 MG tablet Take 2 tablets (1,000 mg total) by mouth every 8 (eight) hours as needed (pain). 06/11/22   Sheryl Debby CROME, MD  amLODipine  (NORVASC ) 10 MG tablet Take 1 tablet (10 mg total) by mouth daily. 03/17/24   Sheryl Debby CROME, MD  b complex vitamins capsule Take 1 capsule by mouth daily.    [provider]  calcium -vitamin D (OSCAL WITH D) 500-5 MG-MCG tablet Take 2 tablets by mouth 2 (two) times daily.    [provider]  famotidine  (PEPCID ) 20 MG tablet Take 1 tablet (20 mg total) by mouth 2 (two) times daily. 02/26/24   [name], MD  HYDROcodone -acetaminophen  (NORCO/VICODIN) 5-325 MG tablet Take 1 tablet by mouth every 12 (twelve) hours as needed for moderate pain (pain score 4-6). 05/12/24   [name], MD  lidocaine -prilocaine  (EMLA ) cream Apply 1 Application topically as needed. 12/25/23   [name], MD  metoCLOPramide  (REGLAN ) 10 MG tablet Take 1 tablet (10 mg total) by mouth every 8 (eight) hours as needed for nausea. 10/16/23   Hanford Powell BRAVO, NP  pantoprazole  (PROTONIX ) 40 MG tablet Take 1 tablet (40 mg total) by mouth daily. 05/13/24   [name], MD  potassium chloride  (KLOR-CON  M) 10 MEQ tablet Take 1 tablet (10 mEq  total) by mouth 3 (three) times daily. 03/11/24   [name], MD  prochlorperazine  (COMPAZINE ) 10 MG tablet Take 1 tablet (10 mg total) by mouth every 6 (six) hours as needed for nausea or vomiting. 09/17/23   [name], MD  promethazine  (PHENERGAN ) 25 MG tablet Take 2 tablets (50 mg total) by mouth 2 (two) times daily as needed for nausea or vomiting. 02/26/24   [name], MD  scopolamine  (TRANSDERM-SCOP) 1 MG/3DAYS Place 1 patch (1.5 mg total) onto the skin every 3 (three) days. 07/03/23   [name], MD  sucralfate  (CARAFATE ) 1 g tablet Take 1 tablet (1 g total) by mouth 4 (four) times daily. 02/26/24   [name], MD  trifluridine -tipiracil  ([name] ) 20-8.19 MG tablet Take 3 tablets (60 mg of trifluridine  total) by mouth 2 (two) times daily after a meal. Take within 1 hr after  AM & PM meals on days 1-5 every 14 days. 05/15/24   [name], MD  zinc gluconate 50 MG tablet Take 50 mg by mouth daily.    [provider]    Physical Exam: BP 117/87   Pulse 84   Temp 98.1 F (36.7 C) (Oral)   Resp 18   Ht 5' 8 (1.727 m)   Wt 63.5 kg   LMP 06/29/2010   SpO2 100%   BMI 21.29 kg/m  General: Pleasant, frail and weak appearing elderly woman laying in bed. No acute distress. HEENT: [place]/AT. Anicteric sclera CV: RRR. No murmurs, rubs, or gallops. No LE edema Pulmonary: Lungs CTAB. Normal effort. No wheezing or rales. Abdominal: Soft, nondistended. Mild epigastric tenderness. Normal bowel sounds. Extremities: Palpable radial and DP pulses. Normal ROM. Skin: Warm and dry. No obvious rash or lesions. Neuro: A&Ox3. Moves all extremities. Normal sensation to light touch. No focal deficit. Psych: Normal mood and affect          Labs on Admission:  Basic Metabolic Panel: Recent Labs  Lab 05/15/24 1039 05/17/24 0120  NA 146* 148*  K 3.4* 3.0*  CL 105 109  CO2 25 25  GLUCOSE 137* 104*  BUN 23* 15  CREATININE 0.49 0.41*  CALCIUM  9.8 9.4  MG  --  1.9   Liver Function Tests: Recent Labs   Lab 05/15/24 1039 05/17/24 0120  AST 18 20  ALT <5 <5  ALKPHOS 76 76  BILITOT 0.7 0.8  PROT 7.1 6.5  ALBUMIN 4.3 3.9   Recent Labs  Lab 05/17/24 0120  LIPASE 178*   No results for input(s): AMMONIA in the last 168 hours. CBC: Recent Labs  Lab 05/15/24 1039 05/17/24 0120  WBC 3.5* 3.1*  NEUTROABS 2.5 2.3  HGB 11.6* 10.8*  HCT 34.1* 33.2*  MCV 81.4 84.7  PLT 294 327   Cardiac Enzymes: No results for input(s): CKTOTAL, CKMB, CKMBINDEX, TROPONINI in the last 168 hours. BNP (last 3 results) No results for input(s): BNP in the last 8760 hours.  ProBNP (last 3 results) No results for input(s): PROBNP in the last 8760 hours.  CBG: No results for input(s): GLUCAP in the last 168 hours.  Radiological Exams on Admission: CT ABDOMEN PELVIS W CONTRAST Result Date: 05/17/2024 EXAM: CT ABDOMEN AND PELVIS WITH CONTRAST 05/17/2024 02:42:58 AM TECHNIQUE: CT of the abdomen and pelvis was performed with the administration of intravenous contrast. Multiplanar reformatted images are provided for review. Automated exposure control, iterative reconstruction, and/or weight-based adjustment of the mA/kV was utilized to reduce the radiation dose to as low as reasonably achievable. COMPARISON: PET CT 03/02/2024 and 12/23/2023, and CT abdomen and pelvis with iv contrast 10/10/2023. CLINICAL HISTORY: Nausea, vomiting, and severe abdominal pain. The patient has stage 4 stomach cancer, recently completed oral chemotherapy this week. The last PET CT demonstrated disease progression since 11/2023. FINDINGS: LOWER CHEST: No acute abnormality. LIVER: The liver is unremarkable. There is no liver mass. GALLBLADDER AND BILE DUCTS: The gallbladder is more distended than on prior studies, but otherwise unremarkable. No biliary ductal dilatation. SPLEEN: No acute abnormality. PANCREAS: No pancreatic mass or ductal dilatation. There is interval increased prominence of lymph nodes or peritoneal  metastases posterior and medial to the proximal stomach, and many of these abut or are very close to the body and tail segment of the pancreas . Largest of these is 3.8 x 1.9 cm alongside the pancreatic tail on series 2 axial 21. This may not have even been  present previously. In addition to the above, there is a new celiac level lymph node posterior to the body of the pancreas measuring 2.7 x 1.7 cm on axial image 21. ADRENAL GLANDS: No adrenal mass. KIDNEYS, URETERS AND BLADDER: There is no right renal mass. A large 8 cm cyst with a few thin septations is again noted in the mid to lower pole of the left kidney. No follow-up imaging is recommended. No urinary stone is seen. No solid renal mass. No hydronephrosis. No perinephric or periureteral stranding. Urinary bladder is unremarkable. GI AND BOWEL: There is progressive circumferential wall thickening in the distal esophagus compared with the PET CT 2 months ago. There is a 1.3 cm hyperdense structure in the lumen at the EG junction, which could be a stuck food bolus. The increased wall thickening could be due to esophagitis or extension of infiltrating disease from the stomach. Circumferentially thickened stomach with enhancing mucosa is again noted compatible with known gastric cancer. There is no small bowel obstruction or inflammation. The appendix is slightly prominent but unchanged. There is moderate fecal stasis. Sigmoid diverticulosis without diverticulitis. PERITONEUM AND RETROPERITONEUM: See below for description of adenopathy. No ascites. No free air. VASCULATURE: Aorta is normal in caliber. There is mild aortic atherosclerosis. No aneurysm. LYMPH NODES: Interval increased prominence of lymph nodes or peritoneal metastases posterior and medial to the proximal stomach. Largest of these is 3.8 x 1.9 cm alongside the pancreatic tail on series 2 axial 21. This may not have even been present previously. New celiac level lymph node posterior to the body of  the pancreas measuring 2.7 x 1.7 cm on axial image 21. Clustered bulky lymph nodes or peritoneal implants up to 2.8 x 2.2 cm on axial image 19, formerly 1.7 x 1.1 cm. Centrally necrotic peritoneal implant to the left of the splenic flexure measuring 3.3 x 2.2 cm on axial 20, previously 1.3 x 1 cm. REPRODUCTIVE ORGANS: The uterus is surgically absent. No adnexal mass is seen. BONES AND SOFT TISSUES: Degenerative changes of the lumbar spine. No regional bone metastases is seen. No acute osseous abnormality. No focal soft tissue abnormality. Numerous pelvic phleboliths. IMPRESSION: 1. Progressive circumferential wall thickening in the distal esophagus, possibly due to esophagitis or extension of infiltrating disease from the stomach, with a 1.3 cm hyperdense structure at the EG junction that may represent a stuck food bolus. 2. No small bowel obstruction. 3. Circumferentially thickened stomach with enhancing mucosa, compatible with known gastric cancer. 4. Interval increased prominence of lymph nodes or peritoneal metastases posterior and medial to the proximal stomach and about the pancreatic tail , with the largest measuring 3.8 x 1.9 cm alongside the pancreatic tail. 5. Clustered bulky lymph nodes or peritoneal implants posteromedial to the stomach up to 2.8 x 2.2 cm, previously 1.7 x 1.1 cm, and a centrally necrotic peritoneal implant to the left of the splenic flexure measuring 3.3 x 2.2 cm, previously 1.3 x 1 cm. 6. New celiac level lymph node posterior to the body of the pancreas measuring 2.7 x 1.7 cm. 7. Constipation and diverticulosis. Electronically signed by: Francis Quam MD 05/17/2024 03:35 AM EST RP Workstation: [id number]   Assessment/Plan Richardson SQUIBB Mullarkey is a 59 y.o. female with medical history significant for metastatic gastric cancer with peritoneal metastases on chemotherapy, leukopenia, anemia, T2DM, HTN and GERD with esophagitis who presented to the ED for evaluation of nausea and vomiting and  admitted for esophageal obstruction.  # Intractable nausea and vomiting # Esophageal obstruction -  Patient with history of metastatic gastric cancer and esophagitis presented with nausea and vomiting with inability to tolerate p.o. intake over the last 4 to 5 days - CT A/P shows findings concerning for start food bolus at the EG junction - GI consulted, EGD showed fluid and food in the distal esophagus that were removed - IV Protonix  40 mg twice daily, resume Carafate  suspension - IV NS 100 cc/h for 1 day - Clear liquid diet, advance as tolerated  # Hypokalemia - K+ low at 3.0 on admission, likely from persistent nausea and vomiting - S/p total of 40 mEq KCl given in the ED - Give additional IV KCl 10 mEq x 2 - F/u morning potassium and mag  # Metastatic gastric cancer # Peritoneal metastases # Cancer-related pain - Recent PET scan showed disease progression in the stomach and nodes, currently on chemotherapy - Continues to endorse epigastric pain - Pain control with home PRN Norco and PRN IV morphine  - Follow-up with oncology in the outpatient, would likely benefit from outpatient palliative care as well  # Leukopenia - Secondary to chemotherapy - WBC of 3.1, around recent baseline of 2.5-3.5  # Anemia - Hgb of 10.8 stable compared to baseline of 11-12 - Trend CBC, transfuse for hgb > 7  # GERD # Esophagitis - EGD today shows severe distal esophagitis with contact bleeding, ulceration and involvement by gastric malignancy - Continue IV PPI  # HTN - BP stable with SBP in the 110-130s - Continue amlodipine   DVT prophylaxis: Lovenox      Code Status: Full Code  Consults called: GI  Family Communication: Discussed results/findings and plan for admission with family at bedside  Severity of Illness: The appropriate patient status for this patient is OBSERVATION. Observation status is judged to be reasonable and necessary in order to provide the required intensity of  service to ensure the patient's safety. The patient's presenting symptoms, physical exam findings, and initial radiographic and laboratory data in the context of their medical condition is felt to place them at decreased risk for further clinical deterioration. Furthermore, it is anticipated that the patient will be medically stable for discharge from the hospital within 2 midnights of admission.   Level of care: Med-Surg    Lou Claretta HERO, MD 05/17/2024, 9:20 AM [facility] Pager: [phone] Isaiah 41:10   If 7PM-7AM, please contact night-coverage [email or web address] Password [email or web address]     [1]  Allergies Allergen Reactions   Aspirin Anaphylaxis   Cyclobenzaprine Anaphylaxis   Naproxen Sodium Anaphylaxis   Zithromax [Azithromycin Dihydrate] Anaphylaxis   Oxaliplatin  Other (See Comments)    Nausea, Scratchy/tight throat, flushing, diaphoretic.See progress note from 07/04/22   Dilaudid [Hydromorphone] Nausea And Vomiting   "

## 2024-05-17 NOTE — Op Note (Addendum)
 [facility] Patient Name: Sheryl [name] Procedure Date: 05/17/2024 MRN: [id number] Attending MD: Gordy CHRISTELLA Starch , MD, [id number] Date of Birth: 05/27/1965 CSN: [id number] Age: 59 Admit Type: Inpatient Procedure:                Upper GI endoscopy Indications:              Dysphagia x 4 days, Foreign body in the                            esophagus/food impaction, Abnormal CT of the GI                            tract (esophageal food impaction, progressive                            gastric cancer with metastasis), Personal history                            of malignant gastric neoplasm Providers:                Gordy CHRISTELLA. Starch, MD, Haskel Chris, Technician,                            Collene Edu, RN Referring MD:             [facility] Medicines:                General Anesthesia Complications:            No immediate complications. Estimated Blood Loss:     Estimated blood loss: none. Procedure:                Pre-Anesthesia Assessment:                           - Prior to the procedure, a History and Physical                            was performed, and patient medications and                            allergies were reviewed. The patient's tolerance of                            previous anesthesia was also reviewed. The risks                            and benefits of the procedure and the sedation                            options and risks were discussed with the patient.                            All questions were answered, and informed consent  was obtained. Prior Anticoagulants: The patient has                            taken no anticoagulant or antiplatelet agents. ASA                            Grade Assessment: IV - A patient with severe                            systemic disease that is a constant threat to life.                            After reviewing the risks and benefits, the patient                             was deemed in satisfactory condition to undergo the                            procedure.                           After obtaining informed consent, the endoscope was                            passed under direct vision. Throughout the                            procedure, the patient's blood pressure, pulse, and                            oxygen  saturations were monitored continuously. The                            [id number]-[id number] ([id number]) Olympus endoscope was introduced                            through the mouth, and advanced to the second part                            of duodenum. The upper GI endoscopy was                            accomplished without difficulty. The patient                            tolerated the procedure well. Scope In: Scope Out: Findings:      Fluid was found in the middle third of the esophagus and in the lower       third of the esophagus. Fluid aspiration was performed via the endoscope.      Food and pill fragments were found in the distal esophagus. This was       completely obstructing the distal esophagus. The impaction could not be       traversed and attempt was not made to pass given prolonged  nature of       impaction. Removal was accomplished gradually with a Raptor grasping       device. The Raptor grasper was used to slowly break apart the impacted       material and then with copious irrigation and lavage this was cleared.      Severe esophagitis with contact bleeding was found in the lower third of       the esophagus and at the GE junction. There appears to be malignant       tissue at the GE junction causing stenosis.      Severe mucosal changes characterized by congestion, adherent blood,       erythema, nodularity and scattered tumors were found in the examined       stomach.      No gross lesions were noted in the entire examined duodenum. Impression:               - Fluid in the middle third of the esophagus and in                             the lower third of the esophagus. Fluid removal                            performed.                           - Food in the distal esophagus. Removal was                            successful.                           - Severe distal esophagitis with contact bleeding,                            ulceration, and involvement by gastric malignancy.                           - Congested, erythematous and nodular mucosa in the                            stomach. Multiple gastric masses seen with adherent                            blood.                           - No gross lesions in the entire examined duodenum. Moderate Sedation:      N/A Recommendation:           - Return patient to hospital ward for ongoing care.                           - Clear liquid diet. Diet can be advanced slowly as                            tolerated but with great caution given findings on  today's exam.                           - BID IV PPI.                           - I will alert Dr. Lanny of her admission.                           - GI will be available, call if questions.                           - Discussed with the patient's son after EGD. Procedure Code(s):        --- Professional ---                           [phone], Esophagogastroduodenoscopy, flexible,                            transoral; with removal of foreign body(s) Diagnosis Code(s):        --- Professional ---                           U81.871J, Food in esophagus causing other injury,                            initial encounter                           K20.91, Esophagitis, unspecified with bleeding                           K31.89, Other diseases of stomach and duodenum                           R13.10, Dysphagia, unspecified                           T18.108A, Unspecified foreign body in esophagus                            causing other injury, initial encounter                            Z85.028, Personal history of other malignant                            neoplasm of stomach                           R93.3, Abnormal findings on diagnostic imaging of                            other parts of digestive tract CPT copyright 2022 [employer]. All rights reserved. The codes documented in this report are preliminary and upon coder review may  be revised to meet current compliance requirements. Gordy HERO  Airen Dales, MD 05/17/2024 12:29:02 PM This report has been signed electronically. Number of Addenda: 0

## 2024-05-17 NOTE — Progress Notes (Signed)
 I was called to the patient's room by nursing secretary to address pain. Patient found sitting up on the side of the bed and stated,  my stomach is griping and I need something to make me go. Patient family members at beside also asked questions about what medications were available for patient to take to have a bowel movement. Patient given prn medications, and some warm liquids to drink. Plan of care ongoing.

## 2024-05-17 NOTE — Plan of Care (Signed)

## 2024-05-17 NOTE — Transfer of Care (Signed)
 Immediate Anesthesia Transfer of Care Note  [name]  Procedure(s) Performed: EGD (ESOPHAGOGASTRODUODENOSCOPY)  Patient Location: PACU  Anesthesia Type:General  Level of Consciousness: awake and alert   Airway & Oxygen  Therapy: Patient Spontanous Breathing  Post-op Assessment: Report given to RN  Post vital signs: Reviewed and stable  Last Vitals:  Vitals Value Taken Time  BP 131/92 05/17/24 13:06  Temp 36.6 C 05/17/24 13:06  Pulse 84 05/17/24 13:06  Resp 15 05/17/24 13:06  SpO2 100 % 05/17/24 13:06    Last Pain:  Vitals:   05/17/24 1306  TempSrc: Oral  PainSc: 0-No pain      Patients Stated Pain Goal: 0 (05/16/24 2357)  Complications: No notable events documented.

## 2024-05-18 ENCOUNTER — Other Ambulatory Visit (HOSPITAL_COMMUNITY): Payer: Self-pay

## 2024-05-18 ENCOUNTER — Other Ambulatory Visit: Payer: Self-pay

## 2024-05-18 DIAGNOSIS — R748 Abnormal levels of other serum enzymes: Secondary | ICD-10-CM

## 2024-05-18 DIAGNOSIS — E870 Hyperosmolality and hypernatremia: Secondary | ICD-10-CM

## 2024-05-18 DIAGNOSIS — E43 Unspecified severe protein-calorie malnutrition: Secondary | ICD-10-CM | POA: Insufficient documentation

## 2024-05-18 LAB — BASIC METABOLIC PANEL WITH GFR
Anion gap: 8 (ref 5–15)
BUN: 9 mg/dL (ref 6–20)
CO2: 25 mmol/L (ref 22–32)
Calcium: 9 mg/dL (ref 8.9–10.3)
Chloride: 108 mmol/L (ref 98–111)
Creatinine, Ser: 0.35 mg/dL — ABNORMAL LOW (ref 0.44–1.00)
GFR, Estimated: >60 mL/min
Glucose, Bld: 62 mg/dL — ABNORMAL LOW (ref 70–99)
Potassium: 3.3 mmol/L — ABNORMAL LOW (ref 3.5–5.1)
Sodium: 141 mmol/L (ref 135–145)

## 2024-05-18 LAB — CBC
HCT: 28.9 % — ABNORMAL LOW (ref 36.0–46.0)
Hemoglobin: 9.5 g/dL — ABNORMAL LOW (ref 12.0–15.0)
MCH: 27.8 pg (ref 26.0–34.0)
MCHC: 32.9 g/dL (ref 30.0–36.0)
MCV: 84.5 fL (ref 80.0–100.0)
Platelets: 289 K/uL (ref 150–400)
RBC: 3.42 MIL/uL — ABNORMAL LOW (ref 3.87–5.11)
RDW: 19.6 % — ABNORMAL HIGH (ref 11.5–15.5)
WBC: 2.8 K/uL — ABNORMAL LOW (ref 4.0–10.5)
nRBC: 0.7 % — ABNORMAL HIGH (ref 0.0–0.2)

## 2024-05-18 LAB — HIV ANTIBODY (ROUTINE TESTING W REFLEX): HIV Screen 4th Generation wRfx: NONREACTIVE

## 2024-05-18 MED ORDER — ENSURE PLUS HIGH PROTEIN PO LIQD
237.0000 mL | Freq: Three times a day (TID) | ORAL | Status: DC
  Administered 2024-05-18: 237 mL via ORAL

## 2024-05-18 MED ORDER — PANTOPRAZOLE SODIUM 40 MG PO TBEC: 40.0000 mg | DELAYED_RELEASE_TABLET | Freq: Two times a day (BID) | ORAL | Status: DC

## 2024-05-18 MED ORDER — POTASSIUM CHLORIDE 10 MEQ/100ML IV SOLN
10.0000 meq | INTRAVENOUS | Status: AC
  Administered 2024-05-18 (×4): 10 meq via INTRAVENOUS
  Filled 2024-05-18 (×4): qty 100

## 2024-05-18 MED ORDER — PANTOPRAZOLE SODIUM 40 MG PO PACK
40.0000 mg | PACK | Freq: Two times a day (BID) | ORAL | Status: DC
  Filled 2024-05-18: qty 40

## 2024-05-18 MED ORDER — HEPARIN SOD (PORK) LOCK FLUSH 100 UNIT/ML IV SOLN
500.0000 [IU] | INTRAVENOUS | Status: AC | PRN
  Administered 2024-05-18: 500 [IU]

## 2024-05-18 MED ORDER — POTASSIUM CHLORIDE 20 MEQ PO PACK
20.0000 meq | PACK | Freq: Every day | ORAL | 1 refills | Status: AC
  Filled 2024-05-18: qty 90, 90d supply, fill #0

## 2024-05-18 MED ORDER — ENSURE PLUS HIGH PROTEIN PO LIQD
237.0000 mL | Freq: Three times a day (TID) | ORAL | 4 refills | Status: AC
  Filled 2024-05-18: qty 20000, 28d supply, fill #0

## 2024-05-18 MED ORDER — PANTOPRAZOLE SODIUM 40 MG PO PACK
40.0000 mg | PACK | Freq: Two times a day (BID) | ORAL | 2 refills | Status: AC
  Filled 2024-05-18: qty 60, 30d supply, fill #0

## 2024-05-18 MED ORDER — PANTOPRAZOLE SODIUM 40 MG PO TBEC
40.0000 mg | DELAYED_RELEASE_TABLET | Freq: Two times a day (BID) | ORAL | 2 refills | Status: AC
  Filled 2024-05-18: qty 60, 30d supply, fill #0

## 2024-05-18 MED ORDER — AMLODIPINE 1 MG/ML ORAL SUSPENSION
10.0000 mg | Freq: Every day | ORAL | 2 refills | Status: AC
  Filled 2024-05-18: qty 300, 30d supply, fill #0

## 2024-05-18 MED ORDER — HYDROCODONE-ACETAMINOPHEN 5-325 MG PO TABS
1.0000 | ORAL_TABLET | ORAL | Status: DC | PRN
  Administered 2024-05-18 (×2): 1 via ORAL
  Filled 2024-05-18 (×2): qty 1

## 2024-05-18 MED ORDER — SUCRALFATE 1 GM/10ML PO SUSP
1.0000 g | Freq: Three times a day (TID) | ORAL | 0 refills | Status: AC
  Filled 2024-05-18 (×2): qty 420, 11d supply, fill #0

## 2024-05-18 MED ORDER — AMLODIPINE 1 MG/ML ORAL SUSPENSION: 10.0000 mg | Freq: Every day | ORAL | Status: DC

## 2024-05-18 MED ORDER — AMLODIPINE BESYLATE 10 MG PO TABS
10.0000 mg | ORAL_TABLET | Freq: Every day | ORAL | 1 refills | Status: AC
  Filled 2024-05-18: qty 90, 90d supply, fill #0

## 2024-05-18 NOTE — Progress Notes (Signed)
 Carafate  suspension delivered in  a secure bag to patient in room by this RN- other discharge meds sent to [facility] in [place]

## 2024-05-18 NOTE — Progress Notes (Signed)
 Initial Nutrition Assessment  DOCUMENTATION CODES:   Severe malnutrition in context of chronic illness  INTERVENTION:   -Ensure Plus High Protein po TID, each supplement provides 350 kcal and 20 grams of protein.   -Provided Full liquid diet handout to patient and reviewed with patient and family present.  NUTRITION DIAGNOSIS:   Severe Malnutrition related to chronic illness, cancer and cancer related treatments as evidenced by moderate fat depletion, severe muscle depletion, energy intake < or equal to 75% for > or equal to 1 month, percent weight loss.  GOAL:   Patient will meet greater than or equal to 90% of their needs  MONITOR:   PO intake, Supplement acceptance  REASON FOR ASSESSMENT:   Consult Assessment of nutrition requirement/status, Poor PO  ASSESSMENT:   59 y.o. female with medical history significant for metastatic gastric cancer with peritoneal metastases on chemotherapy, leukopenia, anemia, T2DM, HTN and GERD with esophagitis who presented to the ED for evaluation of nausea and vomiting. Patient reports that for the last 4 to 5 days, she has has had persistent nausea and vomiting.  Any food or liquid she swallows comes right back up. She also endorsed some generalized upper abdominal pain.  Patient in room, multiple family members at bedside.  Pt having diarrhea today. Eager to go home. Per MD will go home today after visit. [facility], last assessed 1/7. Pt had EGD yesterday 1/18, which revealed food in distal esophagus, severe distal esophagitis, stenosis present at GE junction. Pt now recommended to follow full liquid diet going forward. Provided handout to review options. Pt given Ensure at bedside by RN. Recommend high calorie, high protein supplements given severe malnutrition and continued weight loss.   Per weight records, pt has lost 70 lbs since 07/17/23 (33% wt loss x 10 months, significant for time frame).   Medications:  Carafate , KCl, Zofran    Labs reviewed: Low potassium   NUTRITION - FOCUSED PHYSICAL EXAM:  Flowsheet Row Most Recent Value  Orbital Region Moderate depletion  Upper Arm Region Severe depletion  Thoracic and Lumbar Region Unable to assess  Buccal Region Moderate depletion  Temple Region Moderate depletion  Clavicle Bone Region Severe depletion  Clavicle and Acromion Bone Region Severe depletion  Scapular Bone Region Unable to assess  Dorsal Hand Moderate depletion  Patellar Region Unable to assess  Anterior Thigh Region Unable to assess  Posterior Calf Region Unable to assess  Edema (RD Assessment) None  Hair Unable to assess  Eyes Reviewed  Mouth Reviewed  Skin Reviewed  Nails Reviewed    Diet Order:   Diet Order             Diet full liquid Fluid consistency: Thin  Diet effective now                   EDUCATION NEEDS:   Education needs have been addressed  Skin:  Skin Assessment: Reviewed RN Assessment  Last BM:  1/19 -type 7  Height:   Ht Readings from Last 1 Encounters:  05/16/24 5' 8 (1.727 m)    Weight:   Wt Readings from Last 1 Encounters:  05/16/24 63.5 kg    BMI:  Body mass index is 21.29 kg/m.  Estimated Nutritional Needs:   Kcal:  1900-2100  Protein:  95-105g  Fluid:  2L/day   Sheryl Lee, MS, RD, LDN Inpatient Clinical Dietitian Contact via Secure chat

## 2024-05-18 NOTE — Discharge Summary (Signed)
 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  Sheryl [name] is a 59 y.o. female with medical history significant for metastatic gastric cancer with peritoneal metastases on chemotherapy, leukopenia, anemia, T2DM, HTN and GERD with esophagitis who presented to the ED for evaluation of nausea and vomiting for the past 4 days, unable to tolerate any p.o. intake.  On presentation vital stable, labs with sodium of 148, potassium 3.0, lipase 178, WBC 3.1.  CT with a 1.3 cm hyperdense structure at the EG junction that may represent a stuck food bolus, esophagitis, known gastric malignancy, interval increase in peritoneal and lymph node metastases but no bowel obstruction.   GI was consulted s/p EGD with removal of impacted food, found to have bleeding lower esophagitis, multiple gastric masses.  They are recommending twice daily PPI.  1/19: Vital stable, labs with leukopenia at 2.8, hemoglobin 9.5 which is a decreased from 10.8 on admission, hyponatremia improved, potassium of 3.3.  Replacing potassium with IV, magnesium  was 1.9 yesterday.  GI is recommending doing twice daily PPI and Carafate  which was ordered.  Some of her medications were switched to liquid form as she should be on a liquid diet now.  Dietitian was consulted and patient and family was provided with  recommendations and supplements to meet her nutritional needs with a liquid diet.  Patient will remain high risk for reinfection so she was advised to have small frequent liquid meals.  She will take PPI twice daily along with Carafate .  Patient might need esophageal dilatation in the future once healed from current esophagitis secondary to impaction staying there for days.  She is currently very high risk for any perforation.  Patient is undergoing chemotherapy, CT scan concerning for disease progression.  Her oncologist will follow-up to decide about future treatment plans.  Patient will remain high risk for readmission and mortality based on significant underlying life limiting disease.      Pain control - [place]  Controlled Substance Reporting System database was reviewed. and patient was instructed, not to drive, operate heavy machinery, perform activities at heights, swimming or participation in water activities or provide baby-sitting services while on Pain, Sleep and Anxiety Medications; until their outpatient Physician has advised to do so again. Also recommended to not to take more than prescribed Pain, Sleep and Anxiety Medications.  Consultants: Gastroenterology Procedures performed: EGD Disposition: Home Diet recommendation:  Full liquid diet DISCHARGE MEDICATION: Allergies as of 05/18/2024       Reactions   Aspirin Anaphylaxis   Cyclobenzaprine Anaphylaxis   Naproxen Sodium Anaphylaxis   Zithromax [azithromycin Dihydrate] Anaphylaxis   Oxaliplatin  Other (See Comments)   Nausea, Scratchy/tight throat, flushing, diaphoretic.See progress note from 07/04/22   Dilaudid [hydromorphone] Nausea And Vomiting        Medication List     STOP taking these medications    amLODipine  10 MG tablet Commonly known as: NORVASC   pantoprazole  40 MG tablet Commonly known as: PROTONIX  Replaced by: pantoprazole  sodium 40 mg   potassium chloride  10 MEQ tablet Commonly known  as: KLOR-CON  M   sucralfate  1 g tablet Commonly known as: Carafate  Replaced by: sucralfate  1 GM/10ML suspension       TAKE these medications    Acetaminophen  Extra Strength 500 MG Tabs Take 2 tablets (1,000 mg total) by mouth every 8 (eight) hours as needed (pain).   amLODIPine  1 mg/mL Susp oral suspension Commonly known as: KATERZIA  Take 10 mLs (10 mg total) by mouth daily. Start taking on: May 19, 2024   b complex vitamins capsule Take 1 capsule by mouth daily.   calcium -vitamin D 500-5 MG-MCG tablet Commonly known as: OSCAL WITH D Take 2 tablets by mouth 2 (two) times daily.   famotidine  20 MG tablet Commonly known as: PEPCID  Take 1 tablet (20 mg total) by mouth 2 (two) times daily.   feeding supplement Liqd Take 237 mLs by mouth 3 (three) times daily between meals.   HYDROcodone -acetaminophen  5-325 MG tablet Commonly known as: NORCO/VICODIN Take 1 tablet by mouth every 12 (twelve) hours as needed for moderate pain (pain score 4-6).   lidocaine -prilocaine  cream Commonly known as: EMLA  Apply 1 Application topically as needed. What changed: reasons to take this   [name]  20-8.19 MG tablet Generic drug: trifluridine -tipiracil  Take 3 tablets (60 mg of trifluridine  total) by mouth 2 (two) times daily after a meal. Take within 1 hr after AM & PM meals on days 1-5 every 14 days.   metoCLOPramide  10 MG tablet Commonly known as: REGLAN  Take 1 tablet (10 mg total) by mouth every 8 (eight) hours as needed for nausea.   pantoprazole  sodium 40 mg Commonly known as: PROTONIX  Take 40 mg by mouth 2 (two) times daily. Replaces: pantoprazole  40 MG tablet   potassium chloride  20 MEQ packet Commonly known as: KLOR-CON  Take 20 mEq by mouth daily.   prochlorperazine  10 MG tablet Commonly known as: COMPAZINE  Take 1 tablet (10 mg total) by mouth every 6 (six) hours as needed for nausea or vomiting.   promethazine  25 MG tablet Commonly known as: PHENERGAN  Take 2 tablets  (50 mg total) by mouth 2 (two) times daily as needed for nausea or vomiting. What changed: when to take this   scopolamine  1 MG/3DAYS Commonly known as: TRANSDERM-SCOP Place 1 patch (1.5 mg total) onto the skin every 3 (three) days.   sucralfate  1 GM/10ML suspension Commonly known as: CARAFATE  Take 10 mLs (1 g total) by mouth 4 (four) times daily -  with meals and at bedtime. Replaces: sucralfate  1 g tablet   zinc gluconate 50 MG tablet Take 50 mg by mouth daily.        Discharge Exam: Filed Weights   05/16/24 2357  Weight: 63.5 kg   General.  Frail and severely malnourished lady, in no acute distress. Pulmonary.  Lungs clear bilaterally, normal respiratory effort. CV.  Regular rate and rhythm, no JVD, rub or murmur. Abdomen.  Soft, nontender, nondistended, BS positive. CNS.  Alert and oriented .  No focal neurologic deficit. Extremities.  No edema, pulses intact and symmetrical. Psychiatry.  Judgment and insight appears normal.   Condition at discharge: stable  The results of significant diagnostics from this hospitalization (including imaging, microbiology, ancillary and laboratory) are listed below for reference.   Imaging Studies: CT ABDOMEN PELVIS W CONTRAST Result Date: 05/17/2024 EXAM: CT ABDOMEN AND PELVIS WITH CONTRAST 05/17/2024 02:42:58 AM TECHNIQUE: CT of the abdomen and  pelvis was performed with the administration of intravenous contrast. Multiplanar reformatted images are provided for review. Automated exposure control, iterative reconstruction, and/or weight-based adjustment of the mA/kV was utilized to reduce the radiation dose to as low as reasonably achievable. COMPARISON: PET CT 03/02/2024 and 12/23/2023, and CT abdomen and pelvis with iv contrast 10/10/2023. CLINICAL HISTORY: Nausea, vomiting, and severe abdominal pain. The patient has stage 4 stomach cancer, recently completed oral chemotherapy this week. The last PET CT demonstrated disease progression since  11/2023. FINDINGS: LOWER CHEST: No acute abnormality. LIVER: The liver is unremarkable. There is no liver mass. GALLBLADDER AND BILE DUCTS: The gallbladder is more distended than on prior studies, but otherwise unremarkable. No biliary ductal dilatation. SPLEEN: No acute abnormality. PANCREAS: No pancreatic mass or ductal dilatation. There is interval increased prominence of lymph nodes or peritoneal metastases posterior and medial to the proximal stomach, and many of these abut or are very close to the body and tail segment of the pancreas . Largest of these is 3.8 x 1.9 cm alongside the pancreatic tail on series 2 axial 21. This may not have even been present previously. In addition to the above, there is a new celiac level lymph node posterior to the body of the pancreas measuring 2.7 x 1.7 cm on axial image 21. ADRENAL GLANDS: No adrenal mass. KIDNEYS, URETERS AND BLADDER: There is no right renal mass. A large 8 cm cyst with a few thin septations is again noted in the mid to lower pole of the left kidney. No follow-up imaging is recommended. No urinary stone is seen. No solid renal mass. No hydronephrosis. No perinephric or periureteral stranding. Urinary bladder is unremarkable. GI AND BOWEL: There is progressive circumferential wall thickening in the distal esophagus compared with the PET CT 2 months ago. There is a 1.3 cm hyperdense structure in the lumen at the EG junction, which could be a stuck food bolus. The increased wall thickening could be due to esophagitis or extension of infiltrating disease from the stomach. Circumferentially thickened stomach with enhancing mucosa is again noted compatible with known gastric cancer. There is no small bowel obstruction or inflammation. The appendix is slightly prominent but unchanged. There is moderate fecal stasis. Sigmoid diverticulosis without diverticulitis. PERITONEUM AND RETROPERITONEUM: See below for description of adenopathy. No ascites. No free air.  VASCULATURE: Aorta is normal in caliber. There is mild aortic atherosclerosis. No aneurysm. LYMPH NODES: Interval increased prominence of lymph nodes or peritoneal metastases posterior and medial to the proximal stomach. Largest of these is 3.8 x 1.9 cm alongside the pancreatic tail on series 2 axial 21. This may not have even been present previously. New celiac level lymph node posterior to the body of the pancreas measuring 2.7 x 1.7 cm on axial image 21. Clustered bulky lymph nodes or peritoneal implants up to 2.8 x 2.2 cm on axial image 19, formerly 1.7 x 1.1 cm. Centrally necrotic peritoneal implant to the left of the splenic flexure measuring 3.3 x 2.2 cm on axial 20, previously 1.3 x 1 cm. REPRODUCTIVE ORGANS: The uterus is surgically absent. No adnexal mass is seen. BONES AND SOFT TISSUES: Degenerative changes of the lumbar spine. No regional bone metastases is seen. No acute osseous abnormality. No focal soft tissue abnormality. Numerous pelvic phleboliths. IMPRESSION: 1. Progressive circumferential wall thickening in the distal esophagus, possibly due to esophagitis or extension of infiltrating disease from the stomach, with a 1.3 cm hyperdense structure at the EG junction that may represent a stuck food  bolus. 2. No small bowel obstruction. 3. Circumferentially thickened stomach with enhancing mucosa, compatible with known gastric cancer. 4. Interval increased prominence of lymph nodes or peritoneal metastases posterior and medial to the proximal stomach and about the pancreatic tail , with the largest measuring 3.8 x 1.9 cm alongside the pancreatic tail. 5. Clustered bulky lymph nodes or peritoneal implants posteromedial to the stomach up to 2.8 x 2.2 cm, previously 1.7 x 1.1 cm, and a centrally necrotic peritoneal implant to the left of the splenic flexure measuring 3.3 x 2.2 cm, previously 1.3 x 1 cm. 6. New celiac level lymph node posterior to the body of the pancreas measuring 2.7 x 1.7 cm. 7.  Constipation and diverticulosis. Electronically signed by: Francis Quam MD 05/17/2024 03:35 AM EST RP Workstation: [id number]    Microbiology: Results for orders placed or performed during the hospital encounter of 01/28/21  Resp Panel by RT-PCR (Flu A&B, Covid) Nasopharyngeal Swab     Status: None   Collection Time: 01/28/21 11:26 AM   Specimen: Nasopharyngeal Swab; Nasopharyngeal(NP) swabs in vial transport medium  Result Value Ref Range Status   SARS Coronavirus 2 by RT PCR NEGATIVE NEGATIVE Final    Comment: (NOTE) SARS-CoV-2 target nucleic acids are NOT DETECTED.  The SARS-CoV-2 RNA is generally detectable in upper respiratory specimens during the acute phase of infection. The lowest concentration of SARS-CoV-2 viral copies this assay can detect is 138 copies/mL. A negative result does not preclude SARS-Cov-2 infection and should not be used as the sole basis for treatment or other patient management decisions. A negative result may occur with  improper specimen collection/handling, submission of specimen other than nasopharyngeal swab, presence of viral mutation(s) within the areas targeted by this assay, and inadequate number of viral copies(<138 copies/mL). A negative result must be combined with clinical observations, patient history, and epidemiological information. The expected result is Negative.  Fact Sheet for Patients:  bloggercourse.com  Fact Sheet for Healthcare Providers:  seriousbroker.it  This test is no t yet approved or cleared by the [place]  FDA and  has been authorized for detection and/or diagnosis of SARS-CoV-2 by FDA under an Emergency Use Authorization (EUA). This EUA will remain  in effect (meaning this test can be used) for the duration of the COVID-19 declaration under Section 564(b)(1) of the Act, 21 U.S.C.section 360bbb-3(b)(1), unless the authorization is terminated  or revoked sooner.        Influenza A by PCR NEGATIVE NEGATIVE Final   Influenza B by PCR NEGATIVE NEGATIVE Final    Comment: (NOTE) The Xpert Xpress SARS-CoV-2/FLU/RSV plus assay is intended as an aid in the diagnosis of influenza from Nasopharyngeal swab specimens and should not be used as a sole basis for treatment. Nasal washings and aspirates are unacceptable for Xpert Xpress SARS-CoV-2/FLU/RSV testing.  Fact Sheet for Patients: bloggercourse.com  Fact Sheet for Healthcare Providers: seriousbroker.it  This test is not yet approved or cleared by the [place]  FDA and has been authorized for detection and/or diagnosis of SARS-CoV-2 by FDA under an Emergency Use Authorization (EUA). This EUA will remain in effect (meaning this test can be used) for the duration of the COVID-19 declaration under Section 564(b)(1) of the Act, 21 U.S.C. section 360bbb-3(b)(1), unless the authorization is terminated or revoked.  Performed at [facility], [address] [place], [place] [address]    *Note: Due to a large number of results and/or encounters for the requested time period, some results have not been displayed.  A complete set of results can be found in Results Review.    Labs: CBC: Recent Labs  Lab 05/15/24 1039 05/17/24 0120 05/18/24 0138  WBC 3.5* 3.1* 2.8*  NEUTROABS 2.5 2.3  --   HGB 11.6* 10.8* 9.5*  HCT 34.1* 33.2* 28.9*  MCV 81.4 84.7 84.5  PLT 294 327 289   Basic Metabolic Panel: Recent Labs  Lab 05/15/24 1039 05/17/24 0120 05/18/24 0138  NA 146* 148* 141  K 3.4* 3.0* 3.3*  CL 105 109 108  CO2 25 25 25   GLUCOSE 137* 104* 62*  BUN 23* 15 9  CREATININE 0.49 0.41* 0.35*  CALCIUM  9.8 9.4 9.0  MG  --  1.9  --    Liver Function Tests: Recent Labs  Lab 05/15/24 1039 05/17/24 0120  AST 18 20  ALT <5 <5  ALKPHOS 76 76  BILITOT 0.7 0.8  PROT 7.1 6.5  ALBUMIN 4.3 3.9   CBG: No results for input(s):  GLUCAP in the last 168 hours.  Discharge time spent: greater than 30 minutes.  This record has been created using Conservation officer, historic buildings. Errors have been sought and corrected,but may not always be located. Such creation errors do not reflect on the standard of care.   Signed: Amaryllis Dare, MD [facility] 05/18/2024 "

## 2024-05-18 NOTE — Hospital Course (Addendum)
 Partly taken from prior notes.  Sheryl [name] is a 59 y.o. female with medical history significant for metastatic gastric cancer with peritoneal metastases on chemotherapy, leukopenia, anemia, T2DM, HTN and GERD with esophagitis who presented to the ED for evaluation of nausea and vomiting for the past 4 days, unable to tolerate any p.o. intake.  On presentation vital stable, labs with sodium of 148, potassium 3.0, lipase 178, WBC 3.1.  CT with a 1.3 cm hyperdense structure at the EG junction that may represent a stuck food bolus, esophagitis, known gastric malignancy, interval increase in peritoneal and lymph node metastases but no bowel obstruction.   GI was consulted s/p EGD with removal of impacted food, found to have bleeding lower esophagitis, multiple gastric masses.  They are recommending twice daily PPI.  1/19: Vital stable, labs with leukopenia at 2.8, hemoglobin 9.5 which is a decreased from 10.8 on admission, hyponatremia improved, potassium of 3.3.  Replacing potassium with IV, magnesium  was 1.9 yesterday.  GI is recommending doing twice daily PPI and Carafate  which was ordered.  Some of her medications were switched to liquid form as she should be on a liquid diet now.  Dietitian was consulted and patient and family was provided with recommendations and supplements to meet her nutritional needs with a liquid diet.  Patient will remain high risk for reinfection so she was advised to have small frequent liquid meals.  She will take PPI twice daily along with Carafate .  Patient might need esophageal dilatation in the future once healed from current esophagitis secondary to impaction staying there for days.  She is currently very high risk for any perforation.  Patient is undergoing chemotherapy, CT scan concerning for disease progression.  Her oncologist will follow-up to decide about future treatment plans.  Patient will remain high risk for readmission and mortality based on  significant underlying life limiting disease.

## 2024-05-18 NOTE — Progress Notes (Signed)
 Patient and patient's daughter was given discharge instructions, and all questions were answered. Patient was stable for discharge and was taken to the main exit by wheelchair.  Patient will pick up amlodipine  from [facility] and the potassium from [facility]. She has a previous prescription of protonix  at home that was filled last week.

## 2024-05-18 NOTE — Discharge Instructions (Signed)
Full Liquid Nutrition Therapy ? ?The full liquid diet includes mostly liquids (including milk) and some foods with small amounts of fiber. The full liquid diet can provide many of the nutrients your body needs, but it may not give enough vitamins, minerals, and fiber. ?A fluid is anything that is liquid or anything that would melt if left at room temperature.  You will need to count these foods and liquids--including any liquid used to take medication--as part of your daily fluid intake. Some examples are: ?Coffee, tea, and other hot beverages ?Gelatin  ?Gravy ?Ice cream, sherbet, sorbet ?Ice cubes, ice chips ?Milk, liquid creamer ?Nutritional supplements ?Popsicles ?Vegetable and fruit juices; fluid in canned fruit (fruit itself is not allowed) ?Yogurt (without nuts, seeds, or fruit) ?Soft drinks, lemonade, limeade ?Soups (strained) ?Syrup ?This diet should only be used temporarily during your recovery until it is safe for you to eat regular foods. Your registered dietitian nutritionist can help establish a nutritionally-balanced full liquid meal plan, if needed. ?  ? ?Tips ?Determine which foods/fluids from the list are most appealing to you. ?Eat or drink your favorite flavors to help you better enjoy this diet. ?Include milk-based fluids and juices. If you are lactose intolerant, choose plant-based milks. ?Eat 3 full liquid meals throughout the day and include a snack time between each meal. ?Drink nutritional shakes in 6-8 ounce servings as part of a meal or between meals to make sure you?re taking in enough calories. You can make fortified shakes for yourself or buy them premade at a store. ? ?Foods Recommended ?Food Group Foods Recommended  ?Grains Thin hot cereal, such as cream of wheat  ?Dairy Milk: Nonfat, 1%, 2%, whole ?Soy milk, almond milk, rice milk, coconut milk, cashew milk ?Milkshakes ?Yogurt ?Custard ?Pudding  ?Vegetables Vegetable juice with or without pulp ?Thin, pureed vegetable soups  ?Fruits  Translucent fruit juices without pulp (apple, cranberry, grape)  ?Oils Oils: Almond, avocado, canola, cashew, corn, grapeseed, olive, safflower, sesame, soybean, sunflower ?Butter (melted) ?Margarine (melted)   ?Other Flavored gelatin (any flavor) ?Strained cream soups ?Chicken, beef, or vegetable broths ?Popsicle  ?Beverages Water ?Ice ?Soda ?Tea ?Coffee ?Nutritional supplements or shakes  ?  ?A high-protein shake should contain at least 8-10 grams of protein per serving. Read product labels to find a shake that is high in protein. If you are preparing the shake at home, you can increase the amount of protein by adding protein powder, non-fat dry milk powder, yogurt, or low-fat milk. ? ?  ? ?Foods Not Recommended ?Food Group Foods Not Recommended  ?Grains All grain foods including whole grains, processed grains such as pasta, rice, cold cereals, bread, snacks, and sweets that are flour-based (cakes, cookies)  ?Protein Foods Beef and pork  ?Chicken and [place]  ?Fish  ?Nuts and nut butters  ?Eggs  ?Meat substitutes ?Cold cuts or lunch meat  ?Sausage   ?Dairy Hard cheese ?Yogurt with fruit chunks  ?Vegetables Whole, frozen, fresh, canned varieties   ?Fruits Whole, frozen, fresh, canned varieties   ?Oils Butter ?Coconut oil ?Palm Oil ?Lard  ? ?Full Liquid Diet Sample 1-Day Menu View Nutrient Info ?Breakfast 1 cup cream of wheat ?1 cup yogurt (without nuts, seeds, or fruit) ?? cup orange juice (without pulp) ?? cup high-protein nutritional shake ?1 cup coffee  ?Lunch 1 cup cream of potato soup, blended and strained ?? cup apple juice ?1 cup 1% milk ?1 cup tea  ?Evening Meal 1 cup cream of broccoli soup, blended and strained ?1 cup  1% milk ?1 cup high-protein nutritional shake  ?Daily Sum ?Nutrient Unit Value  ?Macronutrients  ?Energy kcal 1467  ?Energy kJ 6123  ?Protein g 60  ?Total lipid (fat) g 47  ?Carbohydrate, by difference g 207  ?Fiber, total dietary g 4  ?Sugars, total g 71  ?Minerals  ?Calcium, Ca mg 1904   ?Iron, Fe mg 20  ?Sodium, Na mg 2828  ?Vitamins  ?Vitamin C, total ascorbic acid mg 144  ?Vitamin A, IU IU 4201  ?Vitamin D IU 402  ?Lipids  ?Fatty acids, total saturated g 17  ?Fatty acids, total monounsaturated g 12  ?Fatty acids, total polyunsaturated g 17  ?Cholesterol mg 89  ?  ? ?Copyright 2020 ? Academy of [employer] and Dietetics. All rights reserved ? ?

## 2024-05-18 NOTE — TOC Initial Note (Addendum)
 Transition of Care [facility]) - Initial/Assessment Note    Patient Details  Name: Sheryl [name] MRN: [id number] Date of Birth: October 04, 1965  Transition of Care [facility]) CM/SW Contact:    Alfonse JONELLE Rex, RN Phone Number: 05/18/2024, 10:35 AM  Clinical Narrative:     Admitted from home, presents with c/o nausea and vomiting and dysphagia.   PMH metastatic gastric cancer with peritoneal metastases , on chemotherapy.   Underwent Upper GI endoscopy on 1/18, GI following. Resides in a private residence, PCP and insurance on file. CM will follow for dc needs.                    Patient Goals and CMS Choice            Expected Discharge Plan and Services                                              Prior Living Arrangements/Services                       Activities of Daily Living   ADL Screening (condition at time of admission) Independently performs ADLs?: Yes (appropriate for developmental age) Is the patient deaf or have difficulty hearing?: No Does the patient have difficulty seeing, even when wearing glasses/contacts?: No Does the patient have difficulty concentrating, remembering, or making decisions?: No  Permission Sought/Granted                  Emotional Assessment              Admission diagnosis:  Hypokalemia [E87.6] Hypernatremia [E87.0] Esophageal obstruction [K22.2] Normochromic normocytic anemia [D64.9] Obstruction of distal esophagus due to foreign body [T18.108A, W44.9XXA] Elevated lipase [R74.8] Intractable nausea and vomiting [R11.2] Elevated random blood glucose level [R73.9] Leukopenia, unspecified type [D72.819] Patient Active Problem List   Diagnosis Date Noted   Esophageal obstruction 05/17/2024   Leukopenia 05/17/2024   Normochromic normocytic anemia 05/17/2024   Intractable nausea and vomiting 05/17/2024   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) 05/17/2024   Type 2 diabetes mellitus with diabetic neuropathy, without  long-term current use of insulin  (HCC) 03/17/2024   Encounter for general adult medical examination with abnormal findings 03/17/2024   Immunization due 03/17/2024   Need for immunization against influenza 03/13/2023   Gastroesophageal reflux disease with esophagitis without hemorrhage 03/13/2023   Epigastric pain 01/30/2023   Port-A-Cath in place 05/23/2022   Genetic testing 05/21/2022   Gastric cancer (HCC) 04/13/2022   Hypertension 01/24/2022   Type II diabetes mellitus with manifestations (HCC) 01/24/2022   Need for vaccination 01/24/2022   Gastroesophageal reflux disease without esophagitis 01/23/2022   Hyperlipidemia LDL goal <100 01/23/2022   Hypokalemia 01/23/2022   PCP:  [name], MD Pharmacy:   [facility] - [facility] [address] [place] [address] Phone: [phone] Fax: [phone]  [id number] [place], [place] - [address] AT [facility] OF [address] [place] [address] Phone: [phone] Fax: [phone]  [facility] - [facility] [address] [place] [address] Phone: [phone] Fax: [phone]  [facility] [place], [place] - [address] [facility] [address] [place] [address] Phone: [phone] Fax: [phone]  [facility]  [facility] - [facility] [address] [place] [address] Phone: [phone] Fax: [phone]     Social Drivers of Health (SDOH) Social History: SDOH Screenings   Food Insecurity: No Food Insecurity (05/17/2024)  Housing: Low Risk (05/17/2024)  Transportation Needs: No Transportation Needs (05/17/2024)  Utilities: Not At Risk (05/17/2024)  Depression (PHQ2-9): Low Risk (05/16/2024)  Recent Concern: Depression (PHQ2-9) - Medium Risk (03/17/2024)  Financial Resource Strain: Low Risk (09/09/2023)  Physical Activity: Inactive  (09/09/2023)  Social Connections: Moderately Isolated (05/17/2024)  Stress: No Stress Concern Present (09/09/2023)  Tobacco Use: Low Risk (05/17/2024)   SDOH Interventions:     Readmission Risk Interventions     No data to display

## 2024-05-19 ENCOUNTER — Encounter (HOSPITAL_COMMUNITY): Payer: Self-pay | Admitting: Internal Medicine

## 2024-05-19 ENCOUNTER — Telehealth: Payer: Self-pay

## 2024-05-19 ENCOUNTER — Other Ambulatory Visit: Payer: Self-pay

## 2024-05-19 NOTE — Transitions of Care (Post Inpatient/ED Visit) (Signed)
 "  05/19/2024  Name: Sheryl [name] MRN: [id number] DOB: 09-24-1965  Today's TOC FU Call Status: Today's TOC FU Call Status:: Successful TOC FU Call Completed TOC FU Call Complete Date: 05/19/24  Patient's Name and Date of Birth confirmed. Name, DOB  Transition Care Management Follow-up Telephone Call Date of Discharge: 05/18/24 Discharge Facility: [facility]) Type of Discharge: Inpatient Admission Primary Inpatient Discharge Diagnosis:: N/V How have you been since you were released from the hospital?: Better Any questions or concerns?: No  Items Reviewed: Did you receive and understand the discharge instructions provided?: Yes Medications obtained,verified, and reconciled?: Yes (Medications Reviewed) Any new allergies since your discharge?: No Dietary orders reviewed?: Yes Do you have support at home?: Yes People in Home [RPT]: child(ren), adult, sibling(s)  Medications Reviewed Today: Medications Reviewed Today     Reviewed by Emmitt Pan, LPN (Licensed Practical Nurse) on 05/19/24 at 1536  Med List Status: <None>   Medication Order Taking? Sig Documenting Provider Last Dose Status Informant  acetaminophen  (TYLENOL ) 500 MG tablet [id number] Yes Take 2 tablets (1,000 mg total) by mouth every 8 (eight) hours as needed (pain). [name], MD  Active Self, Pharmacy Records  amLODIPine  (KATERZIA ) 1 mg/mL SUSP oral suspension [id number] Yes Take 10 mLs (10 mg total) by mouth daily. Caleen Qualia, MD  Active   amLODipine  (NORVASC ) 10 MG tablet [id number] Yes Take 1 tablet (10 mg total) by mouth daily. Caleen Qualia, MD  Active   b complex vitamins capsule [id number]  Take 1 capsule by mouth daily.  Patient not taking: Reported on 05/19/2024   [provider]  Active Self, Pharmacy Records  calcium -vitamin D (OSCAL WITH D) 500-5 MG-MCG tablet [id number]  Take 2 tablets by mouth 2 (two) times daily.  Patient not taking: Reported on 05/19/2024   [provider]  Active Self, Pharmacy Records  famotidine  (PEPCID ) 20 MG tablet [id number] Yes Take 1 tablet (20 mg total) by mouth 2 (two) times daily. [name], MD  Active Self, Pharmacy Records  feeding supplement (ENSURE PLUS HIGH PROTEIN) LIQD [id number] Yes Take 237 mLs by mouth 3 (three) times daily between meals. Caleen Qualia, MD  Active   HYDROcodone -acetaminophen  (NORCO/VICODIN) 5-325 MG tablet [id number] Yes Take 1 tablet by mouth every 12 (twelve) hours as needed for moderate pain (pain score 4-6). [name], MD  Active Self, Pharmacy Records  lidocaine -prilocaine  (EMLA ) cream [id number] Yes Apply 1 Application topically as needed.  Patient taking differently: Apply 1 Application topically as needed [facility]).   [name], MD  Active Self, Pharmacy Records  metoCLOPramide  (REGLAN ) 10 MG tablet [id number] Yes Take 1 tablet (10 mg total) by mouth every 8 (eight) hours as needed for nausea. Boscia, Heather E, NP  Active Self, Pharmacy Records  pantoprazole  (PROTONIX ) 40 MG tablet [id number] Yes Take 1 tablet (40 mg total) by mouth 2 (two) times daily. Amin, Sumayya, MD  Active   pantoprazole  sodium (PROTONIX ) 40 mg [id number] Yes Take 40 mg by mouth 2 (two) times daily. Caleen Qualia, MD  Active   potassium chloride  (KLOR-CON ) 20 MEQ packet [id number] Yes Take 20 mEq by mouth daily. Caleen Qualia, MD  Active   prochlorperazine  (COMPAZINE ) 10 MG tablet [id number] Yes Take 1 tablet (10 mg total) by mouth every 6 (six) hours as needed for nausea or vomiting. [name], MD  Active Self, Pharmacy Records  promethazine  (PHENERGAN ) 25 MG tablet [id number] Yes Take 2 tablets (50 mg total) by mouth 2 (two) times daily as needed  for nausea or vomiting.  Patient taking differently: Take 50 mg by mouth daily.   [name], MD  Active Self, Pharmacy Records  scopolamine  (TRANSDERM-SCOP) 1 MG/3DAYS [id number] Yes Place 1 patch (1.5 mg total) onto the skin every 3 (three) days. [name], MD  Active Self, Pharmacy Records   sucralfate  (CARAFATE ) 1 GM/10ML suspension [id number] Yes Take 10 mLs (1 g total) by mouth 4 (four) times daily -  with meals and at bedtime. Caleen Qualia, MD  Active   trifluridine -tipiracil  ([name] ) 20-8.19 MG tablet [id number] Yes Take 3 tablets (60 mg of trifluridine  total) by mouth 2 (two) times daily after a meal. Take within 1 hr after AM & PM meals on days 1-5 every 14 days. [name], MD  Active Self, Pharmacy Records  zinc gluconate 50 MG tablet [id number]  Take 50 mg by mouth daily.  Patient not taking: Reported on 05/19/2024   [provider]  Active Self, Pharmacy Records  Med List Note Carmella Asberry FALCON, [facility] 03/30/24 1417): [name]  filled at [facility] and Equipment/Supplies: Were Home Health Services Ordered?: NA Any new equipment or medical supplies ordered?: NA  Functional Questionnaire: Do you need assistance with bathing/showering or dressing?: No Do you need assistance with meal preparation?: No Do you need assistance with eating?: No Do you have difficulty maintaining continence: No Do you need assistance with getting out of bed/getting out of a chair/moving?: No Do you have difficulty managing or taking your medications?: No  Follow up appointments reviewed: PCP Follow-up appointment confirmed?: Yes Date of PCP follow-up appointment?: 05/25/24 Follow-up Provider: [facility] Follow-up appointment confirmed?: NA Do you need transportation to your follow-up appointment?: No Do you understand care options if your condition(s) worsen?: Yes-patient verbalized understanding    SIGNATURE Julian Lemmings, LPN [facility] Nurse Health Advisor Direct Dial [phone]  "

## 2024-05-19 NOTE — Telephone Encounter (Signed)
 Copied from CRM [id number]. Topic: Clinical - Medication Question >> May 19, 2024 10:09 AM Macario HERO wrote: Reason for CRM: Patient son wants to know the difference between pantoprazole  (PROTONIX ) 40 MG tablet  and [pantoprazole  sodium (PROTONIX ) 40 mg] because while patient was in the ER she was advised to stop taking (PROTONIX ) 40 MG tablet. Call back: [phone]

## 2024-05-20 ENCOUNTER — Other Ambulatory Visit: Payer: Self-pay

## 2024-05-20 ENCOUNTER — Telehealth: Payer: Self-pay

## 2024-05-20 ENCOUNTER — Ambulatory Visit: Payer: Self-pay

## 2024-05-20 ENCOUNTER — Other Ambulatory Visit (HOSPITAL_COMMUNITY): Payer: Self-pay

## 2024-05-20 NOTE — Telephone Encounter (Signed)
 Please advise.

## 2024-05-20 NOTE — Telephone Encounter (Unsigned)
 Copied from CRM [id number]. Topic: Clinical - Medication Question >> May 19, 2024 10:09 AM Macario HERO wrote: Reason for CRM: Patient son wants to know the difference between pantoprazole  (PROTONIX ) 40 MG tablet  and [pantoprazole  sodium (PROTONIX ) 40 mg] because while patient was in the ER she was advised to stop taking (PROTONIX ) 40 MG tablet. Call back: [phone] >> May 20, 2024  9:37 AM Deleta RAMAN wrote: Patient son is calling again regarding medication question. Would like for the nurse to give him a call regarding prescriptions she should be taking. Aware of call back time

## 2024-05-20 NOTE — Telephone Encounter (Signed)
 Pt's son Virgil called stating that his son Emeline Boers needs an letter excusing him from school while the pt was admitted to the hospital.  Virgil stated he asked the inpatient nurse to ask the hospitalist for a note excusing Jarod from school but the hospitalist refused.  Demontae stated his son came in on Friday for the weekend and the pt was admitted to the hospital on Saturday.  Demontae stated his son goes to school at [employer] in [place]  but by [facility] had to stay with the pt while admitted in the hospital he was not able to take the pt back home to [place] .  Demontae contacted Dr Demetra office to see if Dr Lanny could write an excuse from school letter for his son.  Stated this nurse will see if Dr Lanny is willing to write a letter for his son.

## 2024-05-20 NOTE — Telephone Encounter (Signed)
 OV scheduled.

## 2024-05-20 NOTE — Assessment & Plan Note (Signed)
 [id number] with peritoneal metastasis. 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   -she started first line chemo FLOT on 05/09/22 -She understands that chemotherapy is palliative, to prolong her life.  We are unlikely going to cure her cancer. -PD-L1 0-1%, very limited benefit from PD-L1 immunotherapy, FO revealed FGFR2 amplification and fusion (+), FGFR inhibitors can be considered in future, no other targeted therapy available  -I changed her chemo from FLOT to FOLFOX on 06/20/2022 -She is not able to return to work due to the cancer and treatment related symptoms.  -I subsequently changed her treatment to maintenance Xeloda  in early August 2024, she is tolerating well overall  -her NGS Caris showed positive Claudin 18.2, she is a candidate for zolbetuximab.  -Repeated EGD on March 21, 2023 showed residual gastric cancer.  We discussed option of changing her chemotherapy back to FOLFOX and add zolbetuximab.  -PET 06/03/2023 showed stable disease (no hypermetabolic disease outside stomach) -Patient developed recurrent abdominal pain, similar to the symptoms she had when she was diagnosed.  She agreed to change treatment back to FOLFOX, and add Zolbetuximab. She started on 07/09/2023. She tolerated first cycle poorly and had prolonged recovery. Oxaliplatin  was stop after cycle 1 due to poor tolerance. She continued 5-fu/LV and zolbe every 2 weeks  -CT 10/10/2023 showed stable disease - Unfortunately PET scan on December 06, 2023 showed worsening peritoneal metastasis.  I recommend change treatment to paclitaxel  and ramucirumab , she started on 12/17/23 -PET 03/02/2024 showed disease progression in stomack and nodes, but Signatera showed improved ctDNA. -chemo changed to [name] on 03/30/2024

## 2024-05-20 NOTE — Telephone Encounter (Signed)
 FYI Only or Action Required?: Action required by provider: med question.  Patient was last seen in primary care on 03/17/2024 by [name], MD.  Called Nurse Triage reporting Medication Problem.  Symptoms began several days ago.  Interventions attempted: Nothing.  Symptoms are: stable.  Triage Disposition: Call PCP When Office is Open  Patient/caregiver understands and will follow disposition?: No, wishes to speak with PCP   Copied from CRM #[id number]. Topic: Clinical - Medication Question >> May 20, 2024  9:38 AM Deleta RAMAN wrote: Reason for CRM: please follow up with yesterday crm concerning what medication patient should be taking Reason for Disposition  [1] Caller has NON-URGENT medicine question about med that PCP prescribed AND [2] triager unable to answer question  Answer Assessment - Initial Assessment Questions Caller with questions re: which meds she should be taking. Reviewed ED Dc list with caller. Has further questions about medications  1. NAME of MEDICINE: What medicine(s) are you calling about?     amlodipine  2. QUESTION: What is your question? (e.g., double dose of medicine, side effect)     When dc'd from hospital family understood any meds that could be given in power or liquid would but she got the pill in the amlodipine  3. PRESCRIBER: Who prescribed the medicine? Reason: if prescribed by specialist, call should be referred to that group.     ED 4. SYMPTOMS: Do you have any symptoms? If Yes, ask: What symptoms are you having?  How bad are the symptoms (e.g., mild, moderate, severe)     no 5. PREGNANCY:  Is there any chance that you are pregnant? When was your last menstrual period?  Protocols used: Medication Question Call-A-AH

## 2024-05-21 ENCOUNTER — Inpatient Hospital Stay (HOSPITAL_BASED_OUTPATIENT_CLINIC_OR_DEPARTMENT_OTHER): Admitting: Hematology

## 2024-05-21 ENCOUNTER — Inpatient Hospital Stay

## 2024-05-21 ENCOUNTER — Encounter: Payer: Self-pay | Admitting: Hematology

## 2024-05-21 VITALS — BP 108/88 | HR 119 | Temp 98.7°F | Resp 15 | Ht 68.0 in | Wt 128.7 lb

## 2024-05-21 DIAGNOSIS — C162 Malignant neoplasm of body of stomach: Secondary | ICD-10-CM

## 2024-05-21 LAB — CBC WITH DIFFERENTIAL (CANCER CENTER ONLY)
Abs Immature Granulocytes: 0.02 K/uL (ref 0.00–0.07)
Basophils Absolute: 0 K/uL (ref 0.0–0.1)
Basophils Relative: 0 %
Eosinophils Absolute: 0.1 K/uL (ref 0.0–0.5)
Eosinophils Relative: 3 %
HCT: 32.1 % — ABNORMAL LOW (ref 36.0–46.0)
Hemoglobin: 11.2 g/dL — ABNORMAL LOW (ref 12.0–15.0)
Immature Granulocytes: 1 %
Lymphocytes Relative: 27 %
Lymphs Abs: 0.7 K/uL (ref 0.7–4.0)
MCH: 27.8 pg (ref 26.0–34.0)
MCHC: 34.9 g/dL (ref 30.0–36.0)
MCV: 79.7 fL — ABNORMAL LOW (ref 80.0–100.0)
Monocytes Absolute: 0.3 K/uL (ref 0.1–1.0)
Monocytes Relative: 14 %
Neutro Abs: 1.3 K/uL — ABNORMAL LOW (ref 1.7–7.7)
Neutrophils Relative %: 55 %
Platelet Count: 335 K/uL (ref 150–400)
RBC: 4.03 MIL/uL (ref 3.87–5.11)
RDW: 19 % — ABNORMAL HIGH (ref 11.5–15.5)
WBC Count: 2.4 K/uL — ABNORMAL LOW (ref 4.0–10.5)
nRBC: 0 % (ref 0.0–0.2)

## 2024-05-21 LAB — CMP (CANCER CENTER ONLY)
ALT: 6 U/L (ref 0–44)
AST: 24 U/L (ref 15–41)
Albumin: 3.8 g/dL (ref 3.5–5.0)
Alkaline Phosphatase: 97 U/L (ref 38–126)
Anion gap: 13 (ref 5–15)
BUN: 12 mg/dL (ref 6–20)
CO2: 28 mmol/L (ref 22–32)
Calcium: 9.2 mg/dL (ref 8.9–10.3)
Chloride: 96 mmol/L — ABNORMAL LOW (ref 98–111)
Creatinine: 0.54 mg/dL (ref 0.44–1.00)
GFR, Estimated: >60 mL/min
Glucose, Bld: 165 mg/dL — ABNORMAL HIGH (ref 70–99)
Potassium: 2.9 mmol/L — ABNORMAL LOW (ref 3.5–5.1)
Sodium: 137 mmol/L (ref 135–145)
Total Bilirubin: 0.8 mg/dL (ref 0.0–1.2)
Total Protein: 6.7 g/dL (ref 6.5–8.1)

## 2024-05-21 NOTE — Progress Notes (Addendum)
 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   -she started first line chemo FLOT on 05/09/22 -She understands that chemotherapy is palliative, to prolong her life.  We are unlikely going to cure her cancer. -PD-L1 0-1%, very limited benefit from PD-L1 immunotherapy, FO revealed FGFR2 amplification and fusion (+), FGFR inhibitors can be considered in future, no other targeted therapy available  -I changed her chemo from FLOT to FOLFOX on 06/20/2022 -She is not able to return to work due to the cancer and treatment related symptoms.  -I subsequently changed her treatment to maintenance Xeloda  in early August 2024, she is tolerating well overall  -her NGS Caris showed positive Claudin 18.2, she is a candidate for zolbetuximab.  -Repeated EGD on March 21, 2023 showed residual gastric cancer.  We discussed option of changing her chemotherapy back to FOLFOX and add zolbetuximab.  -PET 06/03/2023 showed stable disease (no hypermetabolic disease outside stomach) -Patient developed recurrent abdominal pain, similar to the symptoms she had when she was diagnosed.  She agreed to change treatment back to FOLFOX, and add Zolbetuximab. She started on 07/09/2023. She tolerated first cycle poorly and had prolonged recovery. Oxaliplatin  was stop after cycle 1 due to poor tolerance. She continued 5-fu/LV and zolbe every 2  weeks  -CT 10/10/2023 showed stable disease - Unfortunately PET scan on December 06, 2023 showed worsening peritoneal metastasis.  I recommend change treatment to paclitaxel  and ramucirumab , she started on 12/17/23 -PET 03/02/2024 showed disease progression in stomack and nodes, but Signatera showed improved ctDNA. -chemo changed to [name]  on 03/30/2024  Assessment & Plan Metastatic gastric cancer She has advanced metastatic gastric cancer with significant morbidity, including malignant gastric outlet obstruction and poor oral intake. Recent imaging during hospitalization showed disease progression. She received only one cycle of [name]  chemotherapy; due to poor nutritional status and overall condition, further chemotherapy is not recommended. The focus is on supportive care and symptom management. -Her prognosis is very poor, life expectancy is likely a few weeks to a few months. -I discussed home hospice service, especially the benefit and the logistics, and the limitations.  Patient and his husband agree with home hospice, but if they want me to call their children. - Referred to hospice for supportive care and symptom management. - Planned discussion with her children regarding hospice referral.  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 Endoscopy revealed significant obstruction with food and medication impaction requiring manual removal. Esophageal compromise precludes dilation until mucosal healing occurs. - Recommended continuation of liquid diet (Boost, Ensure, juice, water, tea) for nutrition. - Advised avoidance of solid foods. - Discussed possible future esophageal dilation once mucosal healing occurs.  Cancer-related pain Significant pain related to esophageal compromise and malignant obstruction. Treated with morphine  during  hospitalization and transitioned to Vicodin for pain control at home. - Continued Vicodin for pain  management as prescribed.  Plan - Recent hospital admission chart reviewed - Patient is not able to tolerate much solid food, she is able to tolerate some liquid - I recommend home hospice, patient and her husband agreed.  They want me to call their children, before the referral -I provide a letter per her request.  - I will see her as needed in the office.  Addendum -I called patient's son and daughter, and reviewed the above plan.  They had many questions.  After long discussion, they finally agreed with home hospice.  Referral made today.   SUMMARY OF ONCOLOGIC HISTORY: Oncology History Overview Note   Cancer Staging  Gastric cancer [facility]) Staging form: Stomach, AJCC 8th Edition - Clinical stage from 04/19/2022: Stage IVB (cT2, cN0, pM1) - Signed by [name], MD on 05/08/2022 Total positive nodes: 0     Gastric cancer (HCC)  03/30/2022 Procedure   EGD:  Impression:  - Normal esophagus. - A few gastric polyps. Biopsied. - Gastritis. Biopsied. - Non-bleeding gastric ulcer with no stigmata of bleeding. Biopsied. - Normal examined duodenum. Biopsied.  Findings: Diffuse moderate inflammation characterized by congestion (edema), friability and granularity was found in the cardia, in the gastric fundus and in the gastric body. There were associated erosions in multiple places. Biopsies were taken from the antrum, body, and fundus with a cold forceps for histology. Estimated blood loss was minimal.  One non-bleeding cratered gastric ulcer with no stigmata of bleeding was found on the greater curvature of the stomach. The lesion was 6 mm in largest dimension. The mucosa around the ulcer was heaped and led to some deformity in the antrum. Biopsies were taken with a cold forceps for histology. Estimated blood loss was minimal.    03/30/2022 Pathology Results   [name], Sheryl [name]  P  Accession: [id number]  Diagnosis 1. Surgical [P], duodenal - BENIGN SMALL BOWEL MUCOSA WITH NO SIGNIFICANT PATHOLOGIC CHANGES 2. Surgical [P], gastric antrum - GASTRIC ANTRAL MUCOSA WITH FEATURES OF REACTIVE GASTROPATHY - NEGATIVE FOR H. PYLORI ON H&E STAIN - NEGATIVE FOR INTESTINAL METAPLASIA OR MALIGNANCY 3. Surgical [P], gastric body - GASTRIC OXYNTIC MUCOSA WITH REACTIVE/REPARATIVE CHANGES - NEGATIVE FOR H. PYLORI ON H&E STAIN - NEGATIVE FOR INTESTINAL METAPLASIA, DYSPLASIA OR MALIGNANCY 4. Surgical [P], greater curve ulceration - ADENOCARCINOMA WITH SIGNET RING CELL FEATURES (SEE NOTE) 5. Surgical [P], gastric polyps - ADENOCARCINOMA WITH SIGNET RING CELL FEATURES (SEE NOTE) 6. Surgical [P], fundus (gastric) - ADENOCARCINOMA WITH SIGNET RING CELL FEATURES (SEE NOTE) 7. Surgical [P], colon, ascending, polyp (1) - TUBULAR ADENOMA. - NO HIGH GRADE DYSPLASIA OR MALIGNANCY. 8. Surgical [P], colon, transverse, polyp (1) - TUBULAR ADENOMA. - NO HIGH GRADE DYSPLASIA OR MALIGNANCY.    04/13/2022 Initial Diagnosis   Gastric cancer (HCC)   04/19/2022 Cancer Staging   Staging form: Stomach, AJCC 8th Edition - Clinical stage from 04/19/2022: Stage IVB (cT2, cN0, pM1) - Signed by [name], MD on 05/08/2022 Total positive nodes: 0   05/05/2022 Genetic Testing   Negative genetic testing on the Multi-cancer gene panel + RNA.  FH c.259C>T VUS identified.  The report date is May 05, 2022.  The Multi-Cancer + RNA Panel offered by [facility] includes sequencing and/or deletion/duplication analysis of the following 70 genes:  AIP*, ALK, APC*, ATM*, AXIN2*, BAP1*, BARD1*, BLM*, BMPR1A*, BRCA1*, BRCA2*, BRIP1*, CDC73*, CDH1*, CDK4, CDKN1B*, CDKN2A, CHEK2*, CTNNA1*, DICER1*, EPCAM (del/dup only), EGFR, FH*, FLCN*, GREM1 (promoter dup only), HOXB13, KIT, LZTR1, MAX*, MBD4, MEN1*, MET, MITF, MLH1*, MSH2*,  MSH3*, MSH6*, MUTYH*, NF1*, NF2*, NTHL1*, PALB2*, PDGFRA, PMS2*, POLD1*, POLE*, POT1*,  PRKAR1A*, PTCH1*, PTEN*, RAD51C*, RAD51D*, RB1*, RET, SDHA* (sequencing only), SDHAF2*, SDHB*, SDHC*, SDHD*, SMAD4*, SMARCA4*, SMARCB1*, SMARCE1*, STK11*, SUFU*, TMEM127*, TP53*, TSC1*, TSC2*, VHL*. RNA analysis is performed for * genes.    05/09/2022 - 06/07/2022 Chemotherapy   Patient is on Treatment Plan : GASTROESOPHAGEAL FLOT q14d X 4 cycles      Miscellaneous   Foundation One  Biomarker Findings Microsatellite status- Cannot be determined Tumor Mutational Burden- Cannot be determined  Genomic Findings  FGFR2 amplification,FGFR2-TACC2 fusion,  Rearrangement intron 17 ARAF amplification CCND3 amplification TP53 V239fs*74     05/30/2022 Imaging    IMPRESSION: 1. Mild hypermetabolism corresponding to a dominant left upper quadrant mass and smaller perigastric nodules or nodes. 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 Recommend attention to the dominant left upper quadrant soft tissue mass on follow-up exams to exclude unlikely recurrent lymphoma. 2. No gastric hypermetabolism and no typical findings of metastatic disease.   06/20/2022 - 11/16/2022 Chemotherapy   Patient is on Treatment Plan : GASTRIC FOLFOX q14d x 12 cycles     08/27/2022 Imaging    IMPRESSION: No focal gastric mass on CT.   No findings suspicious for recurrent or metastatic disease.   Stable left upper abdominal soft tissue lesion and small lymph nodes, chronic, favoring treated lymphoma.   11/27/2022 Imaging    IMPRESSION: 1. Questionable thickening of the distal esophagus/GE junction and gastric antrum, consider further evaluation with endoscopy. 2. Chronically stable left upper quadrant nodularity and prominent lymph nodes again favored treated lymphoma. Continued attention on follow-up imaging suggested. 3. No convincing evidence of metastatic disease in the chest, abdomen or pelvis. 4. Questionable asymmetric wall thickening of the rectum, consider further  evaluation with colonoscopy. 5. Mild wall thickening of a nondistended urinary bladder, correlate with urinalysis to exclude cystitis. 6. Hepatic steatosis.   07/10/2023 - 11/27/2023 Chemotherapy   Patient is on Treatment Plan : GASTROESOPHAGEAL Zolbetuximab (800/400) + FOLFOX D1,15,29 q42d x 4 cycles / Zolbetuximab (400) + 5FU + Leucovorin  D1,15,29 q42d     12/17/2023 - 03/11/2024 Chemotherapy   Patient is on Treatment Plan : GASTRIC/GE JUNCTION PACLitaxel  D1,8,15 + Ramucirumab  D1,15 q28d        Discussed the use of AI scribe software for clinical note transcription with the patient, who gave verbal consent to proceed.  History of Present Illness Sheryl [name] is a 59 year old female with metastatic gastric cancer presenting for follow-up after recent hospitalization for malignant gastric outlet obstruction and ongoing pain and malnutrition.  She was hospitalized from January 17 to 19, 2026 for acute worsening gastric outlet obstruction with persistent emesis and complete inability to tolerate oral intake. Endoscopy showed severe gastric outlet obstruction with impacted food and medications that required manual removal. Tumor involvement at the esophageal hernia site precluded dilation until mucosal healing. She received IV fluids and IV morphine  and was discharged on Vicodin.  Since discharge she has severe odynophagia in the esophagus and stomach and tolerates only small amounts of liquids. She is taking liquid sucralfate  and pantoprazole  with partial relief. She cannot tolerate oral potassium due to taste and has stopped some medications that are not available in liquid form. Intake is limited to tea, water, and liquid nutritional supplements, with no solid food. Chemotherapy has been held because of poor nutritional status and inability to tolerate oral intake.  Her blood pressure has been consistently low, so she is  not taking antihypertensive therapy. She reports sudden-onset urinary  incontinence when ambulating to the bathroom.  Her family has been present since the hospitalization. She is focused on maintaining caloric intake and symptom control at home.     All other systems were reviewed with the patient and are negative.  MEDICAL HISTORY:  Past Medical History:  Diagnosis Date   Allergy    Blood transfusion without reported diagnosis    had transfusion with hysterectomy   Cataract    Colon polyps 2012   Diabetes (HCC) 03/13/2021   Diabetes (HCC) 05/21/2019   Family history of breast cancer    Family history of pancreatic cancer    Family history of stomach cancer    Fibroid    gastric ca 03/2022   GERD (gastroesophageal reflux disease)    H/O blood clots    History of hysterectomy    fibroids and heavy cycles   Hypertension    Stomach cancer (HCC) 04/05/22   Biopsy results from Dr Wilene    SURGICAL HISTORY: Past Surgical History:  Procedure Laterality Date   ABDOMINAL HYSTERECTOMY     BIOPSY  04/19/2022   Procedure: BIOPSY;  Surgeon: Wilhelmenia Aloha Raddle., MD;  Location: [facility];  Service: Gastroenterology;;   BIOPSY  03/21/2023   Procedure: BIOPSY;  Surgeon: Wilhelmenia Aloha Raddle., MD;  Location: [facility];  Service: Gastroenterology;;   COLONOSCOPY     ESOPHAGOGASTRODUODENOSCOPY N/A 05/17/2024   Procedure: EGD (ESOPHAGOGASTRODUODENOSCOPY);  Surgeon: Albertus Gordy HERO, MD;  Location: [facility];  Service: Gastroenterology;  Laterality: N/A;   ESOPHAGOGASTRODUODENOSCOPY (EGD) WITH PROPOFOL  N/A 04/19/2022   Procedure: ESOPHAGOGASTRODUODENOSCOPY (EGD) WITH PROPOFOL ;  Surgeon: Wilhelmenia Aloha Raddle., MD;  Location: [facility];  Service: Gastroenterology;  Laterality: N/A;   ESOPHAGOGASTRODUODENOSCOPY (EGD) WITH PROPOFOL  N/A 03/21/2023   Procedure: ESOPHAGOGASTRODUODENOSCOPY (EGD) WITH PROPOFOL ;  Surgeon: Wilhelmenia Aloha Raddle., MD;  Location: [facility];  Service: Gastroenterology;  Laterality: N/A;   EUS N/A 04/19/2022   Procedure:  UPPER ENDOSCOPIC ULTRASOUND (EUS) RADIAL;  Surgeon: Wilhelmenia Aloha Raddle., MD;  Location: [facility];  Service: Gastroenterology;  Laterality: N/A;   EXCISION OF SKIN TAG  05/03/2022   Procedure: EXCISION OF CHEST WALL SKIN LESION;  Surgeon: Dasie Leonor CROME, MD;  Location: [facility];  Service: General;;   EYE SURGERY  1997   Removed cataracts   LAPAROSCOPY N/A 05/03/2022   Procedure: LAPAROSCOPY DIAGNOSTIC WITH PERITONEAL WASHINGS;  Surgeon: Dasie Leonor CROME, MD;  Location: [facility] OR;  Service: General;  Laterality: N/A;   POLYPECTOMY  04/19/2022   Procedure: POLYPECTOMY;  Surgeon: Wilhelmenia Aloha Raddle., MD;  Location: [facility];  Service: Gastroenterology;;   PORTACATH PLACEMENT N/A 05/03/2022   Procedure: INSERTION PORT-A-CATH WITH ULTRASOUND GUIDANCE;  Surgeon: Dasie Leonor CROME, MD;  Location: [facility];  Service: General;  Laterality: N/A;   UPPER GASTROINTESTINAL ENDOSCOPY      I have reviewed the social history and family history with the patient and they are unchanged from previous note.  ALLERGIES:  is allergic to aspirin, cyclobenzaprine, naproxen sodium, zithromax [azithromycin dihydrate], oxaliplatin , and dilaudid [hydromorphone].  MEDICATIONS:  Current Outpatient Medications  Medication Sig Dispense Refill   acetaminophen  (TYLENOL ) 500 MG tablet Take 2 tablets (1,000 mg total) by mouth every 8 (eight) hours as needed (pain). 30 tablet 1   amLODIPine  (KATERZIA ) 1 mg/mL SUSP oral suspension Take 10 mLs (10 mg total) by mouth daily. 300 mL 2   amLODipine  (NORVASC ) 10 MG tablet Take 1 tablet (10 mg total) by mouth daily. 90 tablet  1   b complex vitamins capsule Take 1 capsule by mouth daily. (Patient not taking: Reported on 05/19/2024)     calcium -vitamin D (OSCAL WITH D) 500-5 MG-MCG tablet Take 2 tablets by mouth 2 (two) times daily. (Patient not taking: Reported on 05/19/2024)     famotidine  (PEPCID ) 20 MG tablet Take 1 tablet (20 mg total) by mouth 2 (two) times daily. 60 tablet 2    feeding supplement (ENSURE PLUS HIGH PROTEIN) LIQD Take 237 mLs by mouth 3 (three) times daily between meals. 20000 mL 4   HYDROcodone -acetaminophen  (NORCO/VICODIN) 5-325 MG tablet Take 1 tablet by mouth every 12 (twelve) hours as needed for moderate pain (pain score 4-6). 60 tablet 0   lidocaine -prilocaine  (EMLA ) cream Apply 1 Application topically as needed. (Patient taking differently: Apply 1 Application topically as needed [facility]).) 30 g 1   metoCLOPramide  (REGLAN ) 10 MG tablet Take 1 tablet (10 mg total) by mouth every 8 (eight) hours as needed for nausea. 60 tablet 1   pantoprazole  (PROTONIX ) 40 MG tablet Take 1 tablet (40 mg total) by mouth 2 (two) times daily. 60 tablet 2   pantoprazole  sodium (PROTONIX ) 40 mg Take 40 mg by mouth 2 (two) times daily. 60 packet 2   potassium chloride  (KLOR-CON ) 20 MEQ packet Take 20 mEq by mouth daily. 90 packet 1   prochlorperazine  (COMPAZINE ) 10 MG tablet Take 1 tablet (10 mg total) by mouth every 6 (six) hours as needed for nausea or vomiting. 30 tablet 2   promethazine  (PHENERGAN ) 25 MG tablet Take 2 tablets (50 mg total) by mouth 2 (two) times daily as needed for nausea or vomiting. (Patient taking differently: Take 50 mg by mouth daily.) 90 tablet 1   scopolamine  (TRANSDERM-SCOP) 1 MG/3DAYS Place 1 patch (1.5 mg total) onto the skin every 3 (three) days. 10 patch 1   sucralfate  (CARAFATE ) 1 GM/10ML suspension Take 10 mLs (1 g total) by mouth 4 (four) times daily -  with meals and at bedtime. 420 mL 0   trifluridine -tipiracil  ([name] ) 20-8.19 MG tablet Take 3 tablets (60 mg of trifluridine  total) by mouth 2 (two) times daily after a meal. Take within 1 hr after AM & PM meals on days 1-5 every 14 days. 60 tablet 1   zinc gluconate 50 MG tablet Take 50 mg by mouth daily. (Patient not taking: Reported on 05/19/2024)     No current facility-administered medications for this visit.    PHYSICAL EXAMINATION: ECOG PERFORMANCE STATUS: 3 - Symptomatic, >50%  confined to bed  Vitals:   05/21/24 0946  BP: 108/88  Pulse: (!) 119  Resp: 15  Temp: 98.7 F (37.1 C)  SpO2: 100%   Wt Readings from Last 3 Encounters:  05/21/24 128 lb 11.2 oz (58.4 kg)  05/16/24 140 lb (63.5 kg)  04/29/24 140 lb 3.2 oz (63.6 kg)     GENERAL:alert, no distress and comfortable SKIN: skin color, texture, turgor are normal, no rashes or significant lesions EYES: normal, Conjunctiva are pink and non-injected, sclera clear NECK: supple, thyroid  normal size, non-tender, without nodularity LYMPH:  no palpable lymphadenopathy in the cervical, axillary  LUNGS: clear to auscultation and percussion with normal breathing effort HEART: regular rate & rhythm and no murmurs and no lower extremity edema ABDOMEN:abdomen soft, non-tender and normal bowel sounds Musculoskeletal:no cyanosis of digits and no clubbing  NEURO: alert & oriented x 3 with fluent speech, no focal motor/sensory deficits  Physical Exam    LABORATORY DATA:  I have reviewed the  data as listed    Latest Ref Rng & Units 05/21/2024    8:52 AM 05/18/2024    1:38 AM 05/17/2024    1:20 AM  CBC  WBC 4.0 - 10.5 K/uL 2.4  2.8  3.1   Hemoglobin 12.0 - 15.0 g/dL 88.7  9.5  89.1   Hematocrit 36.0 - 46.0 % 32.1  28.9  33.2   Platelets 150 - 400 K/uL 335  289  327         Latest Ref Rng & Units 05/21/2024    8:52 AM 05/18/2024    1:38 AM 05/17/2024    1:20 AM  CMP  Glucose 70 - 99 mg/dL 834  62  895   BUN 6 - 20 mg/dL 12  9  15    Creatinine 0.44 - 1.00 mg/dL 9.45  9.64  9.58   Sodium 135 - 145 mmol/L 137  141  148   Potassium 3.5 - 5.1 mmol/L 2.9  3.3  3.0   Chloride 98 - 111 mmol/L 96  108  109   CO2 22 - 32 mmol/L 28  25  25    Calcium  8.9 - 10.3 mg/dL 9.2  9.0  9.4   Total Protein 6.5 - 8.1 g/dL 6.7   6.5   Total Bilirubin 0.0 - 1.2 mg/dL 0.8   0.8   Alkaline Phos 38 - 126 U/L 97   76   AST 15 - 41 U/L 24   20   ALT 0 - 44 U/L 6   <5       RADIOGRAPHIC STUDIES: I have personally reviewed the  radiological images as listed and agreed with the findings in the report. No results found.    No orders of the defined types were placed in this encounter.  All questions were answered. The patient knows to call the clinic with any problems, questions or concerns. No barriers to learning was detected. The total time spent in the appointment was 40 minutes, including review of chart and various tests results, discussions about plan of care and coordination of care plan     Onita Mattock, MD 05/21/2024     "

## 2024-05-22 ENCOUNTER — Other Ambulatory Visit: Payer: Self-pay

## 2024-05-22 ENCOUNTER — Telehealth: Payer: Self-pay

## 2024-05-22 DIAGNOSIS — C162 Malignant neoplasm of body of stomach: Secondary | ICD-10-CM

## 2024-05-22 NOTE — Progress Notes (Signed)
 Patient is no longer taking [name] , patient will be disenrolled.

## 2024-05-22 NOTE — Progress Notes (Signed)
 A user error has taken place: encounter opened in error, closed for administrative reasons.

## 2024-05-22 NOTE — Telephone Encounter (Signed)
 Received telephone call from the patient requesting Dr. Lanny give her children a call regarding yesterday's appt. Let patient know that I would forward her message to Dr. Lanny. Patient voiced understanding.

## 2024-05-22 NOTE — Telephone Encounter (Signed)
 Per Dr. Lanny - Hospice referral has been placed. Dr. Lanny will be attending.

## 2024-05-25 ENCOUNTER — Inpatient Hospital Stay: Admitting: Internal Medicine

## 2024-06-02 ENCOUNTER — Other Ambulatory Visit (HOSPITAL_COMMUNITY): Payer: Self-pay

## 2024-06-03 ENCOUNTER — Inpatient Hospital Stay: Admitting: Dietician

## 2024-06-03 ENCOUNTER — Encounter: Payer: Self-pay | Admitting: Hematology

## 2024-06-03 ENCOUNTER — Other Ambulatory Visit (HOSPITAL_COMMUNITY): Payer: Self-pay

## 2024-06-23 ENCOUNTER — Inpatient Hospital Stay: Admitting: Internal Medicine

## 2024-09-14 ENCOUNTER — Ambulatory Visit: Admitting: Internal Medicine
